# Patient Record
Sex: Female | Born: 1935 | Race: White | Hispanic: No | Smoking: Never smoker
Health system: Southern US, Community
[De-identification: ages and names within clinical notes are randomized; demographics above are authoritative.]

## PROBLEM LIST (undated history)

## (undated) DIAGNOSIS — K5792 Diverticulitis of intestine, part unspecified, without perforation or abscess without bleeding: Secondary | ICD-10-CM

## (undated) DIAGNOSIS — E785 Hyperlipidemia, unspecified: Secondary | ICD-10-CM

## (undated) DIAGNOSIS — Z950 Presence of cardiac pacemaker: Secondary | ICD-10-CM

## (undated) DIAGNOSIS — M1990 Unspecified osteoarthritis, unspecified site: Secondary | ICD-10-CM

## (undated) DIAGNOSIS — R002 Palpitations: Secondary | ICD-10-CM

## (undated) DIAGNOSIS — I10 Essential (primary) hypertension: Secondary | ICD-10-CM

## (undated) DIAGNOSIS — I480 Paroxysmal atrial fibrillation: Secondary | ICD-10-CM

## (undated) DIAGNOSIS — K922 Gastrointestinal hemorrhage, unspecified: Secondary | ICD-10-CM

## (undated) DIAGNOSIS — I2510 Atherosclerotic heart disease of native coronary artery without angina pectoris: Secondary | ICD-10-CM

## (undated) DIAGNOSIS — E871 Hypo-osmolality and hyponatremia: Secondary | ICD-10-CM

## (undated) DIAGNOSIS — Z8639 Personal history of other endocrine, nutritional and metabolic disease: Secondary | ICD-10-CM

## (undated) DIAGNOSIS — Z8719 Personal history of other diseases of the digestive system: Secondary | ICD-10-CM

## (undated) DIAGNOSIS — H9192 Unspecified hearing loss, left ear: Secondary | ICD-10-CM

## (undated) HISTORY — DX: Presence of cardiac pacemaker: Z95.0

## (undated) HISTORY — DX: Personal history of other endocrine, nutritional and metabolic disease: Z86.39

## (undated) HISTORY — DX: Personal history of other diseases of the digestive system: Z87.19

## (undated) HISTORY — PX: OTHER SURGICAL HISTORY: SHX169

## (undated) HISTORY — PX: ABDOMINAL HYSTERECTOMY: SHX81

## (undated) HISTORY — DX: Essential (primary) hypertension: I10

## (undated) HISTORY — DX: Atherosclerotic heart disease of native coronary artery without angina pectoris: I25.10

## (undated) HISTORY — DX: Unspecified hearing loss, left ear: H91.92

## (undated) HISTORY — DX: Palpitations: R00.2

## (undated) HISTORY — PX: MAZE: SHX5063

## (undated) HISTORY — DX: Paroxysmal atrial fibrillation: I48.0

## (undated) HISTORY — PX: PARTIAL HYSTERECTOMY: SHX80

## (undated) HISTORY — DX: Hypo-osmolality and hyponatremia: E87.1

## (undated) HISTORY — DX: Hyperlipidemia, unspecified: E78.5

---

## 2005-08-15 DIAGNOSIS — I2510 Atherosclerotic heart disease of native coronary artery without angina pectoris: Secondary | ICD-10-CM

## 2005-08-15 HISTORY — DX: Atherosclerotic heart disease of native coronary artery without angina pectoris: I25.10

## 2005-09-15 HISTORY — PX: CORONARY ARTERY BYPASS GRAFT: SHX141

## 2005-09-16 ENCOUNTER — Inpatient Hospital Stay (HOSPITAL_BASED_OUTPATIENT_CLINIC_OR_DEPARTMENT_OTHER): Admission: RE | Admit: 2005-09-16 | Discharge: 2005-09-16 | Payer: Self-pay | Admitting: Cardiovascular Disease

## 2005-09-28 ENCOUNTER — Inpatient Hospital Stay (HOSPITAL_COMMUNITY): Admission: RE | Admit: 2005-09-28 | Discharge: 2005-10-04 | Payer: Self-pay | Admitting: Surgery

## 2009-07-13 ENCOUNTER — Emergency Department (HOSPITAL_COMMUNITY): Admission: EM | Admit: 2009-07-13 | Discharge: 2009-07-13 | Payer: Self-pay | Admitting: Emergency Medicine

## 2010-05-11 ENCOUNTER — Ambulatory Visit: Payer: Self-pay | Admitting: Cardiovascular Disease

## 2010-12-31 NOTE — Cardiovascular Report (Signed)
NAME:  [name], [name] NO.:  [id number]   MEDICAL RECORD NO.:  [id number]          PATIENT TYPE:  OIB   LOCATION:  1963                         FACILITY:  [facility]   PHYSICIAN:  [name], M.D. DATE OF BIRTH:  11/17/1925   DATE OF PROCEDURE:  09/16/2005  DATE OF DISCHARGE:                              CARDIAC CATHETERIZATION   Donna Ortega is a 77 year old female who was recently diagnosed as having  atrial fibrillation with a rapid ventricular response.  She was seen in the  office on Monday and she was found to have T-wave versions in the anterior  leads.  She was scheduled for heart catheterization at that time.   The right femoral artery was easily cannulated using the modified Seldinger  technique.   HEMODYNAMICS:  The LV pressure was 123/18 with an aortic pressure of 123/78.   ANGIOGRAPHY:  Left main:  The left main is moderately to heavily calcified.  There is no critical stenoses.   The left anterior descending artery has moderate to severe calcification in  the proximal segment.  There is a 20-30% stenosis followed by a tight  eccentric calcified 80% stenosis.  This stenosis is right at the takeoff of  the first diagonal branch.  The distal LAD has mild irregularities and it is  moderate in size.   The first diagonal vessel arises on the middle of the calcified LAD  stenosis.  The first diagonal has a 70-80% stenosis at its origin.   The left circumflex artery has mild irregularities.  There is no critical  stenosis.   The right coronary artery is very large and is dominant.  There is minor  luminal irregularities.  The posterior descending artery and the  posterolateral segment artery normal.   The left ventriculogram was performed in a 30 RAO position.  It revealed a  hyperdynamic apex.  There is what appears to be very mild hypokinesis of the  anterior wall.  The overall ejection fraction is normal and is probably 60%.  There is mild to  moderate mitral regurgitation.   COMPLICATIONS:  None.   CONCLUSION:  Single-vessel disease involving the left anterior descending  and the first diagonal vessel.  This lesion is very heavily calcified and it  does not look favorable for angioplasty.  We may be able to try rotational  atherectomy followed by stent placement but the diagonal vessel could be  compromised using this technique.  We will also need to consider coronary  artery bypass grafting.  She has overall well-preserved left ventricular  systolic function.  She does have mild hypokinesis of the anterior wall  suggestive of a small subendocardial infarction at  some point in the past.  This is probably the explanation of her  anterolateral T-wave inversions.  We will continue with medical therapy and  will discuss these results with the family.  She may need to be referred for  coronary artery bypass grafting.           ______________________________  [name], M.D.     PJN/MEDQ  D:  09/16/2005  T:  09/16/2005  Job:  [id number]

## 2010-12-31 NOTE — Discharge Summary (Signed)
NAME:  Donna Ortega, Donna Ortega NO.:  [id number]   MEDICAL RECORD NO.:  [id number]          PATIENT TYPE:  INP   LOCATION:  2014                         FACILITY:  [facility]   PHYSICIAN:  Evelene Croon, M.D.     DATE OF BIRTH:  November 29, 1925   DATE OF ADMISSION:  09/28/2005  DATE OF DISCHARGE:  10/04/2005                                 DISCHARGE SUMMARY   ADMISSION DIAGNOSIS:  High-grade single-vessel coronary artery disease.   DISCHARGE DIAGNOSES:  1.  Coronary artery disease, status post coronary artery bypass graft.  2.  Hypercholesterolemia.  3.  Acute blood loss anemia.  4.  Volume overload.  5.  Atrial fibrillation.   SERVICE:  [facility].   CARDIOLOGIST:  Dr. Kristeen Miss.   CONSULTS:  None.   PROCEDURES:  On September 28, 2005, the patient underwent a median  sternotomy, extracorporeal circulation, coronary artery bypass graft surgery  x3 using a left internal mammary artery graft to the second diagonal branch  of the left anterior descending,  saphenous vein graft to the first diagonal  branch of the left anterior descending, a saphenous vein graft to the  intermediate coronary artery.  The patient had a modified radiofrequency Cox-  maze III procedure by Dr. Evelene Croon.  The patient also underwent  endoscopic vein harvesting from the right leg.   HISTORY AND PHYSICAL:  Donna Ortega is a 77 year old healthy woman who remains  active playing golf.  She recently developed shortness of breath and lower  extremity edema as well as decreased energy level.  The patient was treated  in [place], [place], where she was found to have atrial fibrillation with  rapid ventricular response.  She was hospitalized and treated with a  Cardizem drip, which slowed her heart rate.  The patient was discharged on  oral Lopressor as an outpatient and was found to be in atrial fibrillation  with rapid ventricular rate in the 130-140 range, which lasted for a couple  of weeks.  The patient  was referred to Dr. Harvie Bridge office for an  electrocardiogram, which showed T wave inversions in the anterior leads.  The patient was in atrial fibrillation with a controlled ventricular rate in  the 90s.  The patient reportedly had an echocardiogram done in [place],  which showed an ejection fraction of about 40%.   On September 16, 2005, the patient underwent cardiac catheterization, which  showed a left main to be heavily calcified with no particular stenosis.  The  LAD had moderate-to-severe calcification proximally with 20% to 30% stenosis  followed by 70% to 80% stenosis.  The stenosis was at the takeoff of a  moderate-sized first diagonal branch.  The first diagonal itself had 70% to  80% ostial stenosis.  The left circumflex was also calcified proximally.  It  was difficult to see the ostium of the left circumflex, but there was  narrowing in this area.  There was a moderate-sized intermediate or first  marginal branch and a smaller second marginal branch.  The right coronary  artery was a large dominant vessel with moderate  luminal irregularities.  Left ventricular ejection fraction was about 60% with very mild hypokinesis  of the anterior wall.  There was mild mitral regurgitation.  It was best  felt that the patient underwent coronary artery bypass surgery.  Due to the  calcified nature of her arteries, she is not a candidate for a percutaneous  intervention.  The patient has agreed to continue.  It was thought that it  would be best to perform a radiofrequency maze procedure due to the  patient's new onset of atrial fibrillation and she has also agreed to  continue this.   HOSPITAL COURSE:  On postop day #1, the patient remained afebrile, blood  pressure 120/55.  The patient was in sinus versus junctional rhythm.  Her  weight was 148.9; preoperatively, it was 136.4.  The patient's labs were  within normal limits and her chest x-ray was okay.  The patient was  transfused 1 unit  of packed red blood cells on September 29, 2005; her  hemoglobin was 8.1.  On postop day #2, the patient's hemoglobin had risen to  9.1.  She was started on Niferex and folic acid daily.  The patient's  hemoglobin remained stable throughout her stay and improved appropriately.   The patient did have some volume overload and she was diuresed with Lasix  and started on potassium chloride daily.  The patient was restarted on her  Coumadin on postoperative day #2, best thought to resume her Coumadin and  she would stay on this for at least 3 months to be sure she remains in sinus  rhythm.   The patient was transferred to [facility] on September 29, 2005.  The patient  ambulated daily and had improved throughout her hospital stay appropriately.  On October 01, 2005, the patient had an EKG which showed junctional rhythm.  On October 02, 2005, the patient had a 12-lead EKG which showed small P  waves with primary A-V block.  P waves were not seen well on telemetry  monitor.  On October 03, 2005, the patient did spike a temperature of  100.5.  The patient had agreed to stay overnight to be monitored.  She did  remain afebrile overnight and was discharged to home the next day.   PHYSICAL EXAMINATION:  VITALS:  Blood pressure 133/83, heart rate 86,  respirations 20, temperature 98.3, O2 96% on room air.  I&O __________  -  600.  Weight 140, preop 151.  CARDIOVASCULAR:  Regular rate and rhythm.  RESPIRATORY:  Clear to auscultation bilaterally.  EXTREMITIES:  Pitting edema 1+, pulses 2+ bilaterally.  SKIN:  Incisions appear dry and intact, healing well.   LABORATORY DATA:  INR 2.3.  The patient had a CBC which showed a white blood  cell count of 8, hemoglobin 9.6, hematocrit 27.8 and a platelet count of  174,000.  The patient had a BMP which showed a sodium of 128, potassium 3.8,  chloride 92, CO2 27, BUN 12 and creatinine 0.9 and glucose of 95.  On October 04, 2005, the patient's chest tube sutures  were removed and  Steri-Strips were placed.  The patient did have her electronic pacing wires  removed.   DISCHARGE CONDITION:  Good.   DISPOSITION:  The patient will be discharged to home.   MEDICATIONS:  1.  Aspirin 81 mg p.o. daily.  2.  __________  10 mg two daily.  3.  Tylox tabs q.4 h. p.r.n.  4.  Niferex 150 mg daily.  5.  Folic  acid 1 mg p.o. daily.  6.  Lasix 40 mg p.o. daily.  7.  K-Dur 20 mEq p.o. daily.  8.  Coumadin 5 mg p.o. daily.   INSTRUCTIONS:   DIET:  The patient was instructed to follow a low-fat, low-salt diet.   ACTIVITY:  She is to do no driving or heavy lifting greater than 10 pounds  for 3 weeks.   WOUND CARE:  The patient may shower and clean wound with mild soap and  water.  She is to call the office if any wound problems should arise such as  erythema, drainage, opening from wound site.   FOLLOWUP:  The patient has a followup planned with Dr. Laneta Simmers on September 20, 2005 at 12 p.m.  She is to call Dr. Elease Hashimoto for an appointment in 2 weeks  past discharge.  The patient has a followup appointment for an INR on  October 05, 2005 at Dr. Harvie Bridge office.      Constance Holster, [place]      Evelene Croon, M.D.  Electronically Signed    JMW/MEDQ  D:  11/14/2005  T:  11/16/2005  Job:  [id number]   cc:   [name], M.D.  Fax: [phone]

## 2010-12-31 NOTE — Op Note (Signed)
NAME:  Donna Ortega, Donna Ortega NO.:  [id number]   MEDICAL RECORD NO.:  [id number]          PATIENT TYPE:  INP   LOCATION:  2314                         FACILITY:  [facility]   PHYSICIAN:  Evelene Croon, M.D.     DATE OF BIRTH:  June 18, 1926   DATE OF PROCEDURE:  09/28/2005  DATE OF DISCHARGE:                                 OPERATIVE REPORT   PREOPERATIVE DIAGNOSES:  1.  Severe two-vessel coronary artery disease.  2.  Persistent atrial fibrillation.   POSTOPERATIVE DIAGNOSES:  1.  Severe two-vessel coronary artery disease.  2.  Persistent atrial fibrillation.   OPERATIVE PROCEDURE:  1.  Median sternotomy, extracorporeal circulation, coronary artery bypass      graft surgery x3 using a left internal mammary artery graft to the      second diagonal branch of the left anterior descending, a saphenous vein      graft to the first diagonal branch of the left anterior descending, and      a saphenous vein graft to the intermediate coronary artery.  2.  Modified radiofrequency Cox-maze III procedure.  3.  Endoscopic vein harvesting from the right leg.   ATTENDING SURGEON:  Evelene Croon, M.D.   ASSISTANT:  Kerin Perna, M.D.   SECOND ASSISTANTS:  1.  Charlesetta Garibaldi, P.A.-C  2.  Pecola Leisure, P.A.-C   ANESTHESIA:  General endotracheal.   CLINICAL HISTORY:  This patient is a 77 year old relatively healthy woman  who recently developed shortness of breath and lower extremity edema with a  decreased energy level.  She was treated in [place], [place], where she  was found to be in atrial fibrillation with a rapid ventricular response.  She was initially hospitalized and treated with Cardizem for rate control  and was discharged on oral Lopressor.  As an outpatient she was found to the  back in atrial fibrillation with rapid ventricular rate in the 130-140  range, which lasted for several weeks.  Her daughter-in-law is a Engineer, civil (consulting) and  was concerned about her getting  appropriate care and referred her to Dr.  Kristeen Miss at [facility].  An electrocardiogram showed T wave  inversion in the anterior leads.  She was in atrial fibrillation with a  controlled ventricular rate in the 90s.  An echocardiogram had been done in  [place] and this showed an EF of about 40%.  She underwent cardiac  catheterization on September 16, 2005.  This showed the left main to be  heavily calcified with no critical stenosis.  The LAD had moderate-to-severe  calcification proximally with 20% to 30% stenosis followed by the an  eccentric 80% stenosis.  This stenosis was at the takeoff of a moderate-  sized first diagonal branch.  The first diagonal itself had about 70% to 80%  stenosis.  The left circumflex was also calcified proximally and there is  some haziness at the ostium and it was difficult to tell if there was a  stenosis there or not.  There was a moderate-sized intermediate branch and a  small marginal branch.  The right coronary  was a large dominant vessel with  mild irregularities.  Left ventricular ejection fraction was 60% with very  mild hypokinesis of the anterior wall.  There was no mitral regurgitation.  After review of the angiogram and her history, it was felt that coronary  bypass graft surgery and the radiofrequency maze procedure would be the best  treatment to treat her coronary stenoses as well as to try to prevent  further atrial fibrillation, which has been poorly tolerated.  I discussed  the operative procedure of coronary bypass surgery and radiofrequency maze  procedure with the patient and her son.  We discussed the alternatives,  benefits, and risks including, but not limited to bleeding, blood  transfusion, infection, stroke, myocardial infarction, need for permanent  pacemaker, graft failure, persistence of the atrial fibrillation, and death.  She understood and agreed to proceed.   OPERATIVE PROCEDURE:  The patient was taken to the  operating room and placed  on the table in a supine position.  After induction of general endotracheal  anesthesia, a Foley catheter was placed in the bladder using sterile  technique.  Then the chest, abdomen and both lower extremities were prepped  and draped in the usual sterile manner.  The chest was opened through a  median sternotomy incision and the pericardium opened in the midline.  Examination of the heart showed good ventricular contractility, but the  heart was somewhat larger than expected and covered with a thick layer of  epicardial fat over its entire surface.  The ascending aorta was also  generous, but not aneurysmal.  There were no palpable plaques in it.   Then the left internal mammary artery was harvested from the chest wall as a  pedicle graft.  This was a medium-caliber vessel with excellent blood flow  through it.  At the same time, a segment of greater saphenous vein was  harvested from the right leg using endoscopic vein harvest technique.  This  vein was of medium size and good quality.   Then the patient was heparinized and when an adequate activated clotting  time was achieved, the distal ascending aorta was cannulated using a 20-  [place] aortic cannula for arterial inflow.  Venous outflow was achieved  using bicaval venous cannulation with a 24-French metal-tipped right-angle  cannula placed through a pursestring suture in the superior vena cava and a  36-French plastic right-angle cannula placed through a pursestring suture in  the low right atrium.  The superior and inferior vena cavae were encircled  with tapes.  An antegrade cardioplegia and vent cannula was inserted in the  aortic root.   The patient was placed on cardiopulmonary bypass and the distal coronaries  identified.  The LAD was not visible on the surface of the heart.  The first  diagonal branch was a small-to-moderate-sized vessel that was graftable. The second diagonal branch was a  moderate-sized vessel that was graftable.  The intermediate was small, but graftable.  The obtuse marginal was not  graftable.  The right coronary was a large system with no significant plaque  palpable distally.   Then the aorta was crossclamped and 500 mL of cold blood antegrade  cardioplegia were administered in the aortic root with quick arrest of the  heart.  Systemic hypothermia to 20 degrees centigrade and topical  hypothermia with iced saline was used.  A temperature probe was placed in  septum and an insulating pad in the pericardium.  Additional doses of cold  cardioplegia were given  at about 20-minute intervals to maintain myocardial  temperature around 10 degrees centigrade.   Then the modified radiofrequency Cox-maze III procedure was begun by  encircling the left superior and inferior pulmonary vein together with a  Vesseloop.  Then the Cardioblate bipolar radiofrequency device was placed  around the pulmonary veins and up onto the left atrium.  An encircling  lesion was made around the left pulmonary veins in this fashion.  Then this  bipolar device was used to make an encircling lesion around the base of the  left atrial appendage.   Then attention was turned to coronary bypass.  The first distal anastomosis  was performed to the intermediate vessel.  This vessel had an internal  diameter about 1.5 mm.  The conduit that was used was a segment of greater  saphenous vein and the anastomosis performed in an end-to-side manner using  continuous 8-0 Prolene suture.  Flow was noted through the graft and was  good.   The second distal anastomosis was performed to the first diagonal branch.  The internal diameter was about 1.6 mm.  The conduit that was used was a  second segment of greater saphenous vein and the anastomosis performed in an  end-to-side manner using continuous 7-0 Prolene suture.  Flow was measured  through the graft and was excellent.   The third distal  anastomosis was performed to the second diagonal branch.  I  tried to find the LAD deep beneath the epicardial fat, but it was  intramyocardial and lying quite deeply with a large anterior descending vein  lying immediately over this area.  I felt it would be safer to graft the  second diagonal branch, which was roughly about the same size and was  located on the surface of the heart and freely communicated with the LAD  beyond the 80% stenosis.  The conduit used for this anastomosis was the left  internal mammary graft and this was brought through an opening in the left  pericardium anterior to the phrenic nerve.  It was anastomosed to the second  diagonal branch in an end-to-side manner using continuous 8-0 Prolene  suture.  The pedicle was sutured to the epicardium with 6-0 Prolene sutures.   Then attention was turned back to the maze procedure.  The left atrium was  opened through a vertical incision in the interatrial groove.  The encircling lesion around the right pulmonary veins was completed using the  bipolar device.  Then the unipolar device was used to create a lesion  between the 2 encircling pulmonary vein lesions superiorly.  Then a unipolar  lesion was continued from the lesion around the left pulmonary veins up to  the lesion at the base of the left atrial appendage.  The left atrial  appendage was then oversewn with a continuous 3-0 Prolene suture in 2  layers.  Then another lesion was formed with the unipolar device from the  left pulmonary vein encircling lesion down to the mitral annulus in the area  of P3.  Another unipolar lesion was formed from the midpoint of this lesion  across the coronary sinus.   Then attention was turned to the right-sided maze lesions.  The bipolar  device was then used to create an encircling lesion at the base of the right  atrial appendage.  A small atriotomy was made in this area and the bipolar  device was inserted and an oblique lesion  formed along the lateral right  atrial wall to  the midpoint of the right atrium.  Then another oblique  lesion was made in the right atrium from the septum at the region of the  inferior pulmonary vein up to the atrioventricular groove.  The bipolar  device was then inserted and superior and inferior lesions were made from  the lower edge of this atriotomy up to the opening of the superior and  inferior vena cavae, respectively.  Then the bipolar device was used to  create a lesion across the interatrial septum.  This was continued across  the fossa ovalis to the posterior aspect of the coronary sinus.  The  unipolar device was then used to continue this lesion from the posterior  aspect of the coronary sinus up to the tricuspid annulus at the area of the  posterior leaflet.  This lesion was also continued inferiorly from the  coronary sinus down to the orifice of the inferior vena cava.  The unipolar  pen was also used to create lesions from the atrial appendage down to the  tricuspid annulus at the region of the anterior tricuspid leaflet.  The last  lesion was formed from the atriotomy at the region of the atrioventricular  groove down to the area of the posterior leaflet at the tricuspid annulus.  The maze procedure was then completed.  The patient was rewarmed to 37  degrees centigrade.  The left atriotomy incision was closed in 2 layers  using continuous 3-0 Prolene suture.  The right atrial incisions were closed  using continuous 4-0 Prolene suture in 2 layers.  Then the proximal  anastomoses of the vein grafts were performed to the aortic root in an end-  to-side manner using continuous 6-0 Prolene suture.  The left side of the  heart was de-aired and the head placed in a Trendelenburg position.  The  clamp was removed from the mammary pedicle.  There was rapid warming of the  ventricular septum.  Then the crossclamp was removed with a time of 140 minutes.  The patient returned with  a junctional rhythm.  The proximal and  distal anastomoses appeared hemostatic and the lines of the graft  satisfactory.  Graft markers were placed around the proximal anastomoses.  Two temporary right ventricular and atrial pacing wires were placed and  brought out through the skin.   When the patient had rewarmed to 37 degrees centigrade, she was weaned from  cardiopulmonary bypass on low-dose dopamine.  She was then in junctional  rhythm and being AV-paced.  Cardiac function appeared good with a cardiac  output of 4 L/min.  Protamine was given and the venous and aortic cannulae  were removed without difficulty.  Hemostasis was achieved.  The patient was  given 10 units of platelets due to coagulopathy with a platelet count of  around 100,000.  Hemostasis was achieved.  Three chest tubes were placed  with 2 in the posterior pericardium, 1 in the left pleural space and 1 in  the anterior mediastinum.  The pericardium was loosely reapproximated over  the heart.  The sternum was then closed with #6 stainless steel wires.  Fascia was closed with a continuous #1 Vicryl suture.  Subcutaneous tissue  was closed with a continuous 2-0 Vicryl and the skin with 3-0 Vicryl  subcuticular closure.  The lower extremity vein harvest sites were closed in  layers in a similar manner.  The sponge, needle and instrument counts were  correct according to the scrub nurse.  Dry sterile dressings were applied  over the incisions and around the chest tubes, which were hooked to Pleur-  evac suction.  The patient remained hemodynamically stable and was  transported to the SICU in guarded, but stable condition.      Evelene Croon, M.D.  Electronically Signed     BB/MEDQ  D:  09/28/2005  T:  09/29/2005  Job:  [id number]   cc:   [name], M.D.  Fax: [phone]   [facility]

## 2011-01-03 ENCOUNTER — Other Ambulatory Visit: Payer: Self-pay | Admitting: *Deleted

## 2011-01-03 MED ORDER — METOPROLOL TARTRATE 25 MG PO TABS: 25.0000 mg | ORAL_TABLET | Freq: Two times a day (BID) | ORAL | Status: DC

## 2011-01-03 NOTE — Telephone Encounter (Signed)
Metoprolol refilled.

## 2011-01-06 ENCOUNTER — Encounter: Payer: Self-pay | Admitting: Cardiovascular Disease

## 2011-01-11 ENCOUNTER — Ambulatory Visit (INDEPENDENT_AMBULATORY_CARE_PROVIDER_SITE_OTHER): Payer: No Typology Code available for payment source | Admitting: Cardiovascular Disease

## 2011-01-11 ENCOUNTER — Encounter: Payer: Self-pay | Admitting: Cardiovascular Disease

## 2011-01-11 VITALS — BP 132/76 | HR 64 | Ht 64.0 in | Wt 143.0 lb

## 2011-01-11 DIAGNOSIS — I4891 Unspecified atrial fibrillation: Secondary | ICD-10-CM

## 2011-01-11 DIAGNOSIS — I2510 Atherosclerotic heart disease of native coronary artery without angina pectoris: Secondary | ICD-10-CM

## 2011-01-11 DIAGNOSIS — I482 Chronic atrial fibrillation, unspecified: Secondary | ICD-10-CM | POA: Insufficient documentation

## 2011-01-11 DIAGNOSIS — I10 Essential (primary) hypertension: Secondary | ICD-10-CM | POA: Insufficient documentation

## 2011-01-11 NOTE — Assessment & Plan Note (Signed)
She remains stable.  She is tolerating the Pradaxa quite well.

## 2011-01-11 NOTE — Assessment & Plan Note (Signed)
Donna Ortega is doing well from a cardiac standpoint.  She has not had any episodes of angina.

## 2011-01-11 NOTE — Progress Notes (Signed)
[name] Date of Birth  [birth year] [facility] / [facility] [address]     [address] [place], [place]  [address] [phone]  Fax  [phone]  History of Present Illness:  Michelle is an  female with a history of CAD/ CABG.   She also has a history of  atrial fibrillation. No cardiac complaints.  Had a few days of GI bleeding  - thinks it was diverticulitis.    Current Outpatient Prescriptions on File Prior to Visit  Medication Sig Dispense Refill  . aspirin 81 MG tablet Take 81 mg by mouth daily.        . Calcium Carbonate-Vitamin D (CALTRATE 600+D PO) Take by mouth daily.        . dabigatran (PRADAXA) 150 MG CAPS Take 150 mg by mouth every 12 (twelve) hours.        . hydrochlorothiazide (,MICROZIDE/HYDRODIURIL,) 12.5 MG capsule Take 12.5 mg by mouth daily.        Marland Kitchen lisinopril (PRINIVIL,ZESTRIL) 20 MG tablet Take 20 mg by mouth daily. 2 daily      . metoprolol tartrate (LOPRESSOR) 25 MG tablet Take 1 tablet (25 mg total) by mouth 2 (two) times daily.  60 tablet  11  . Multiple Vitamin (MULTIVITAMIN) tablet Take 1 tablet by mouth daily.        . pravastatin (PRAVACHOL) 40 MG tablet Take 40 mg by mouth daily.        Marland Kitchen DISCONTD: Naproxen Sodium (ALEVE PO) Take by mouth as needed.          Allergies  Allergen Reactions  . Lipitor (Atorvastatin Calcium)     MUSCLE ACHES  . Niaspan (Niacin (Antihyperlipidemic)) Rash    Past Medical History  Diagnosis Date  . Coronary artery disease   . Hyperlipidemia   . Hypertension   . Atrial fibrillation   . History of hyperkalemia     Past Surgical History  Procedure Date  . Maze   . Coronary artery bypass graft   . Partial hysterectomy     History  Smoking status  . Never Smoker   Smokeless tobacco  . Not on file    History  Alcohol Use No    Family History  Problem Relation Age of Onset  . Heart failure Father   . Kidney failure Father   . Bone cancer Brother   . Kidney failure Sister     . Colon cancer Sister   . Cancer Sister     Reviw of Systems:  Reviewed in the HPI.  All other systems are negative.  Physical Exam: BP 132/76  Pulse 64  Ht 5\' 4"  (1.626 m)  Wt 143 lb (64.864 kg)  BMI 24.55 kg/m2 The patient is alert and oriented x 3.  The mood and affect are normal.  The skin is warm and dry.  Color is normal.  The HEENT exam reveals that the sclera are nonicteric.  The mucous membranes are moist.  The carotids are 2+ without bruits.  There is no thyromegaly.  There is no JVD.  The lungs are clear.  The chest wall is non tender.  The heart exam reveals an irregular rate with a normal S1 and S2.  There are no murmurs, gallops, or rubs.  The PMI is not displaced.   Abdominal exam reveals good bowel sounds.  There is no guarding or rebound.  There is no hepatosplenomegaly or tenderness.  There are no masses.  Exam of  the legs reveal no clubbing, cyanosis, or edema.  The legs are without rashes.  The distal pulses are intact.  Cranial nerves II - XII are intact.  Motor and sensory functions are intact.  The gait is normal.  Assessment / Plan:

## 2011-01-11 NOTE — Assessment & Plan Note (Signed)
Dr. Everlene Other recently increased her Lisinporil.  Her BP today is perfect.

## 2011-01-14 ENCOUNTER — Telehealth: Payer: Self-pay | Admitting: Cardiovascular Disease

## 2011-01-14 ENCOUNTER — Other Ambulatory Visit: Payer: Self-pay | Admitting: *Deleted

## 2011-01-14 MED ORDER — LISINOPRIL 40 MG PO TABS: 40.0000 mg | ORAL_TABLET | Freq: Every day | ORAL | Status: DC

## 2011-01-14 NOTE — Telephone Encounter (Signed)
Pt said that she got printout of her meds when she was her the other day and some of the meds are not right please call

## 2011-01-14 NOTE — Progress Notes (Signed)
Incoming call to change meds done.Alfonso Ramus RN

## 2011-01-19 ENCOUNTER — Telehealth: Payer: Self-pay | Admitting: Cardiovascular Disease

## 2011-01-19 NOTE — Telephone Encounter (Signed)
25MG  FLUID PILL EVERY MORNING. PT SAID SHE GAVE THE INCORRECT INFO AT HER VISIT THE OTHER DAY.

## 2011-06-15 ENCOUNTER — Telehealth: Payer: Self-pay | Admitting: Cardiovascular Disease

## 2011-07-12 ENCOUNTER — Telehealth: Payer: Self-pay

## 2011-07-12 NOTE — Telephone Encounter (Signed)
Patient walked in .Stated she was to pick up samples of Pradaxa samples.Patient was given 4 week supply.

## 2011-07-15 ENCOUNTER — Encounter: Payer: Self-pay | Admitting: Cardiovascular Disease

## 2011-07-15 ENCOUNTER — Ambulatory Visit (INDEPENDENT_AMBULATORY_CARE_PROVIDER_SITE_OTHER): Payer: No Typology Code available for payment source | Admitting: Cardiovascular Disease

## 2011-07-15 DIAGNOSIS — I2510 Atherosclerotic heart disease of native coronary artery without angina pectoris: Secondary | ICD-10-CM

## 2011-07-15 DIAGNOSIS — I4891 Unspecified atrial fibrillation: Secondary | ICD-10-CM

## 2011-07-15 LAB — LIPID PANEL
LDL Cholesterol: 74 mg/dL (ref 0–99)
Total CHOL/HDL Ratio: 2
Triglycerides: 32 mg/dL (ref 0.0–149.0)
VLDL: 6.4 mg/dL (ref 0.0–40.0)

## 2011-07-15 LAB — BASIC METABOLIC PANEL
Chloride: 90 mEq/L — ABNORMAL LOW (ref 96–112)
Creatinine, Ser: 0.8 mg/dL (ref 0.4–1.2)
GFR: 78.39 mL/min (ref >60.00)
Sodium: 126 mEq/L — ABNORMAL LOW (ref 135–145)

## 2011-07-15 LAB — HEPATIC FUNCTION PANEL
Albumin: 4 g/dL (ref 3.5–5.2)
Total Bilirubin: 0.6 mg/dL (ref 0.3–1.2)
Total Protein: 6.9 g/dL (ref 6.0–8.3)

## 2011-07-15 NOTE — Patient Instructions (Addendum)
look up Xarelto 20 mg a day.  You could take this instead of the Pradaxa if it is cheaper.  Your physician recommends that you return for a FASTING lipid profile: today and in 6 months  Your physician wants you to follow-up in: 6 months You will receive a reminder letter in the mail two months in advance. If you don't receive a letter, please call our office to schedule the follow-up appointment.

## 2011-07-15 NOTE — Assessment & Plan Note (Signed)
She remains in normal sinus rhythm. She has documented intermittent atrial fibrillation. She is concerned about the price of Pradaza. I've given her the name of Donna Ortega which she will look up on her insurance plan. She does not want to start Coumadin.

## 2011-07-15 NOTE — Assessment & Plan Note (Signed)
Donna Ortega is very stable. We'll continue with her same medications.

## 2011-07-15 NOTE — Progress Notes (Signed)
    Donna Ortega Date of Birth  20-Apr-1926 [facility] [address]    [address] [place], [place]  [address] [phone]  Fax  [phone]  History of Present Illness:  Donna Ortega is an 77 year old female with a history of coronary artery disease.  She is  status post coronary artery bypass grafting.  She has a history of atrial fibrillation but is in normal sinus rhythm today. She's not having any episodes of chest pain or shortness breath.  Current Outpatient Prescriptions on File Prior to Visit  Medication Sig Dispense Refill  . acetaminophen (TYLENOL) 325 MG tablet Take 650 mg by mouth every 6 (six) hours as needed.        Marland Kitchen aspirin 81 MG tablet Take 81 mg by mouth daily.        . Calcium Carbonate-Vitamin D (CALTRATE 600+D PO) Take by mouth daily.        . dabigatran (PRADAXA) 150 MG CAPS Take 150 mg by mouth every 12 (twelve) hours.        . hydrochlorothiazide 25 MG tablet Take 12.5 mg by mouth daily.       Marland Kitchen lisinopril (PRINIVIL,ZESTRIL) 40 MG tablet Take 1 tablet (40 mg total) by mouth daily. 2 daily      . metoprolol tartrate (LOPRESSOR) 25 MG tablet Take 1 tablet (25 mg total) by mouth 2 (two) times daily.  60 tablet  11  . Multiple Vitamin (MULTIVITAMIN) tablet Take 1 tablet by mouth daily.        . pravastatin (PRAVACHOL) 40 MG tablet Take 40 mg by mouth daily.          Allergies  Allergen Reactions  . Lipitor (Atorvastatin Calcium)     MUSCLE ACHES  . Niaspan (Niacin (Antihyperlipidemic)) Rash    Past Medical History  Diagnosis Date  . Coronary artery disease   . Hyperlipidemia   . Hypertension   . Atrial fibrillation   . History of hyperkalemia   . Hyponatremia     Past Surgical History  Procedure Date  . Maze   . Coronary artery bypass graft   . Partial hysterectomy     History  Smoking status  . Never Smoker   Smokeless tobacco  . Not on file    History  Alcohol Use No    Family History  Problem Relation Age of Onset  . Heart  failure Father   . Kidney failure Father   . Bone cancer Brother   . Kidney failure Sister   . Colon cancer Sister   . Cancer Sister     Reviw of Systems:  Reviewed in the HPI.  All other systems are negative.  Physical Exam: BP 155/71  Pulse 64  Ht 5\' 4"  (1.626 m)  Wt 144 lb (65.318 kg)  BMI 24.72 kg/m2 The patient is alert and oriented x 3.  The mood and affect are normal.   Skin: warm and dry.  Color is normal.    HEENT:   [place]/AT, no JVD, normal carotids  Lungs: Lungs are clear.  Heart: RR, soft systolic murmur    Abdomen: Her abdomen is soft. She has good bowel sounds.  Extremities:  No clubbing cyanosis or edema.  Neuro:  Exam is nonfocal. Gait is normal.    ECG: Normal sinus rhythm. She has a few premature atrial contractions. She has nonspecific ST and T-wave changes.  Assessment / Plan:

## 2011-07-16 DIAGNOSIS — Z8719 Personal history of other diseases of the digestive system: Secondary | ICD-10-CM

## 2011-07-16 HISTORY — DX: Personal history of other diseases of the digestive system: Z87.19

## 2011-08-01 ENCOUNTER — Other Ambulatory Visit: Payer: Self-pay | Admitting: Cardiovascular Disease

## 2011-08-03 ENCOUNTER — Emergency Department (HOSPITAL_COMMUNITY): Payer: No Typology Code available for payment source

## 2011-08-03 ENCOUNTER — Inpatient Hospital Stay (HOSPITAL_COMMUNITY)
Admission: EM | Admit: 2011-08-03 | Discharge: 2011-08-14 | DRG: 515 | Disposition: A | Discharge status: Home / Self Care | Payer: No Typology Code available for payment source | Attending: Internal Medicine | Admitting: Internal Medicine

## 2011-08-03 ENCOUNTER — Encounter (HOSPITAL_COMMUNITY): Payer: Self-pay | Admitting: *Deleted

## 2011-08-03 ENCOUNTER — Other Ambulatory Visit: Payer: Self-pay

## 2011-08-03 DIAGNOSIS — K922 Gastrointestinal hemorrhage, unspecified: Secondary | ICD-10-CM | POA: Diagnosis present

## 2011-08-03 DIAGNOSIS — M810 Age-related osteoporosis without current pathological fracture: Secondary | ICD-10-CM | POA: Diagnosis present

## 2011-08-03 DIAGNOSIS — E871 Hypo-osmolality and hyponatremia: Secondary | ICD-10-CM | POA: Diagnosis present

## 2011-08-03 DIAGNOSIS — T465X5A Adverse effect of other antihypertensive drugs, initial encounter: Secondary | ICD-10-CM | POA: Diagnosis present

## 2011-08-03 DIAGNOSIS — K274 Chronic or unspecified peptic ulcer, site unspecified, with hemorrhage: Secondary | ICD-10-CM | POA: Diagnosis present

## 2011-08-03 DIAGNOSIS — Z9889 Other specified postprocedural states: Secondary | ICD-10-CM

## 2011-08-03 DIAGNOSIS — I4891 Unspecified atrial fibrillation: Secondary | ICD-10-CM | POA: Diagnosis present

## 2011-08-03 DIAGNOSIS — M8448XA Pathological fracture, other site, initial encounter for fracture: Principal | ICD-10-CM | POA: Diagnosis present

## 2011-08-03 DIAGNOSIS — D62 Acute posthemorrhagic anemia: Secondary | ICD-10-CM | POA: Diagnosis present

## 2011-08-03 DIAGNOSIS — N183 Chronic kidney disease, stage 3 unspecified: Secondary | ICD-10-CM | POA: Diagnosis present

## 2011-08-03 DIAGNOSIS — M545 Low back pain, unspecified: Secondary | ICD-10-CM | POA: Diagnosis present

## 2011-08-03 DIAGNOSIS — Z7901 Long term (current) use of anticoagulants: Secondary | ICD-10-CM

## 2011-08-03 DIAGNOSIS — I482 Chronic atrial fibrillation, unspecified: Secondary | ICD-10-CM | POA: Diagnosis present

## 2011-08-03 DIAGNOSIS — Z951 Presence of aortocoronary bypass graft: Secondary | ICD-10-CM

## 2011-08-03 DIAGNOSIS — I959 Hypotension, unspecified: Secondary | ICD-10-CM | POA: Diagnosis present

## 2011-08-03 DIAGNOSIS — I129 Hypertensive chronic kidney disease with stage 1 through stage 4 chronic kidney disease, or unspecified chronic kidney disease: Secondary | ICD-10-CM | POA: Diagnosis present

## 2011-08-03 DIAGNOSIS — K921 Melena: Secondary | ICD-10-CM | POA: Diagnosis present

## 2011-08-03 DIAGNOSIS — I10 Essential (primary) hypertension: Secondary | ICD-10-CM

## 2011-08-03 DIAGNOSIS — S32000A Wedge compression fracture of unspecified lumbar vertebra, initial encounter for closed fracture: Secondary | ICD-10-CM | POA: Diagnosis present

## 2011-08-03 DIAGNOSIS — I2510 Atherosclerotic heart disease of native coronary artery without angina pectoris: Secondary | ICD-10-CM | POA: Diagnosis present

## 2011-08-03 DIAGNOSIS — E785 Hyperlipidemia, unspecified: Secondary | ICD-10-CM | POA: Diagnosis present

## 2011-08-03 HISTORY — DX: Diverticulitis of intestine, part unspecified, without perforation or abscess without bleeding: K57.92

## 2011-08-03 LAB — PROTIME-INR
INR: 1.67 — ABNORMAL HIGH (ref 0.00–1.49)
Prothrombin Time: 20 seconds — ABNORMAL HIGH (ref 11.6–15.2)

## 2011-08-03 LAB — CBC
HCT: 24.6 % — ABNORMAL LOW (ref 36.0–46.0)
Hemoglobin: 8.5 g/dL — ABNORMAL LOW (ref 12.0–15.0)
MCH: 31.1 pg (ref 26.0–34.0)
RBC: 2.73 MIL/uL — ABNORMAL LOW (ref 3.87–5.11)

## 2011-08-03 LAB — CARDIAC PANEL(CRET KIN+CKTOT+MB+TROPI)
CK, MB: 3.4 ng/mL (ref 0.3–4.0)
Total CK: 75 U/L (ref 7–177)
Troponin I: <0.3 ng/mL (ref <0.30)

## 2011-08-03 LAB — TROPONIN I: Troponin I: 0.34 ng/mL (ref <0.30)

## 2011-08-03 LAB — POCT I-STAT, CHEM 8
BUN: 35 mg/dL — ABNORMAL HIGH (ref 6–23)
Chloride: 96 mEq/L (ref 96–112)
Sodium: 126 mEq/L — ABNORMAL LOW (ref 135–145)
TCO2: 22 mmol/L (ref 0–100)

## 2011-08-03 LAB — APTT: aPTT: 59 seconds — ABNORMAL HIGH (ref 24–37)

## 2011-08-03 MED ORDER — DILTIAZEM HCL 25 MG/5ML IV SOLN
10.0000 mg | Freq: Once | INTRAVENOUS | Status: AC
  Administered 2011-08-03: 10 mg via INTRAVENOUS
  Filled 2011-08-03: qty 5

## 2011-08-03 MED ORDER — FUROSEMIDE 10 MG/ML IJ SOLN
20.0000 mg | Freq: Once | INTRAMUSCULAR | Status: AC
  Administered 2011-08-04: 20 mg via INTRAVENOUS
  Filled 2011-08-03: qty 2

## 2011-08-03 MED ORDER — SODIUM CHLORIDE 0.9 % IV SOLN
8.0000 mg/h | INTRAVENOUS | Status: DC
  Administered 2011-08-03: 8 mg/h via INTRAVENOUS
  Filled 2011-08-03 (×6): qty 80

## 2011-08-03 MED ORDER — ACETAMINOPHEN 650 MG RE SUPP: 650.0000 mg | Freq: Four times a day (QID) | RECTAL | Status: DC | PRN

## 2011-08-03 MED ORDER — OXYCODONE-ACETAMINOPHEN 5-325 MG PO TABS
1.0000 | ORAL_TABLET | Freq: Four times a day (QID) | ORAL | Status: DC | PRN
  Administered 2011-08-03 – 2011-08-05 (×4): 1 via ORAL
  Filled 2011-08-03 (×4): qty 1

## 2011-08-03 MED ORDER — SODIUM CHLORIDE 0.9 % IV SOLN
INTRAVENOUS | Status: DC
  Administered 2011-08-03: 16:00:00 via INTRAVENOUS

## 2011-08-03 MED ORDER — DILTIAZEM HCL 25 MG/5ML IV SOLN
10.0000 mg | Freq: Once | INTRAVENOUS | Status: AC
  Administered 2011-08-03: 10 mg via INTRAVENOUS

## 2011-08-03 MED ORDER — ADENOSINE 12 MG/4ML IV SOLN
12.0000 mg | Freq: Once | INTRAVENOUS | Status: DC
  Filled 2011-08-03: qty 4

## 2011-08-03 MED ORDER — MORPHINE SULFATE 2 MG/ML IJ SOLN
1.0000 mg | INTRAMUSCULAR | Status: DC | PRN
  Administered 2011-08-03 – 2011-08-06 (×3): 1 mg via INTRAVENOUS
  Filled 2011-08-03 (×2): qty 1

## 2011-08-03 MED ORDER — ONDANSETRON HCL 4 MG PO TABS: 4.0000 mg | ORAL_TABLET | Freq: Four times a day (QID) | ORAL | Status: DC | PRN

## 2011-08-03 MED ORDER — MORPHINE SULFATE 4 MG/ML IJ SOLN
4.0000 mg | Freq: Once | INTRAMUSCULAR | Status: AC
  Administered 2011-08-03: 4 mg via INTRAVENOUS
  Filled 2011-08-03: qty 1

## 2011-08-03 MED ORDER — DILTIAZEM HCL 100 MG IV SOLR
5.0000 mg/h | Freq: Once | INTRAVENOUS | Status: AC
  Administered 2011-08-03: 5 mg/h via INTRAVENOUS
  Filled 2011-08-03: qty 100

## 2011-08-03 MED ORDER — SODIUM CHLORIDE 0.9 % IV SOLN
250.0000 mL | INTRAVENOUS | Status: DC | PRN
  Administered 2011-08-03 – 2011-08-06 (×2): 250 mL via INTRAVENOUS

## 2011-08-03 MED ORDER — ACETAMINOPHEN 325 MG PO TABS: 650.0000 mg | ORAL_TABLET | Freq: Four times a day (QID) | ORAL | Status: DC | PRN

## 2011-08-03 MED ORDER — ONDANSETRON HCL 4 MG/2ML IJ SOLN: 4.0000 mg | Freq: Four times a day (QID) | INTRAMUSCULAR | Status: DC | PRN

## 2011-08-03 MED ORDER — DILTIAZEM HCL 100 MG IV SOLR
2.0000 mg/h | INTRAVENOUS | Status: DC
  Filled 2011-08-03: qty 100

## 2011-08-03 MED ORDER — ONDANSETRON HCL 4 MG/2ML IJ SOLN
4.0000 mg | Freq: Once | INTRAMUSCULAR | Status: AC
  Administered 2011-08-03: 4 mg via INTRAVENOUS
  Filled 2011-08-03: qty 2

## 2011-08-03 MED ORDER — SODIUM CHLORIDE 0.9 % IJ SOLN
3.0000 mL | INTRAMUSCULAR | Status: DC | PRN
  Administered 2011-08-06 (×2): 3 mL via INTRAVENOUS

## 2011-08-03 MED ORDER — SODIUM CHLORIDE 0.9 % IV SOLN
80.0000 mg | Freq: Once | INTRAVENOUS | Status: AC
  Administered 2011-08-03: 80 mg via INTRAVENOUS
  Filled 2011-08-03: qty 80

## 2011-08-03 MED ORDER — SODIUM CHLORIDE 0.9 % IJ SOLN
3.0000 mL | Freq: Two times a day (BID) | INTRAMUSCULAR | Status: DC
  Administered 2011-08-03 – 2011-08-13 (×16): 3 mL via INTRAVENOUS

## 2011-08-03 MED ORDER — ALBUTEROL SULFATE (5 MG/ML) 0.5% IN NEBU: 2.5000 mg | INHALATION_SOLUTION | RESPIRATORY_TRACT | Status: DC | PRN

## 2011-08-03 MED ORDER — SODIUM CHLORIDE 0.9 % IV SOLN
INTRAVENOUS | Status: DC
  Administered 2011-08-03 – 2011-08-05 (×2): via INTRAVENOUS

## 2011-08-03 MED ORDER — METOPROLOL TARTRATE 12.5 MG HALF TABLET
12.5000 mg | ORAL_TABLET | Freq: Two times a day (BID) | ORAL | Status: DC
  Administered 2011-08-03 – 2011-08-04 (×3): 12.5 mg via ORAL
  Filled 2011-08-03 (×5): qty 1

## 2011-08-03 NOTE — H&P (Signed)
Patient ID:  Donna Ortega  MRN: [id number]  DOB/AGE: 75/03/1926 75 y.o.  Admit date: 08/03/2011  Referring Physician Dr Denton Lank  Primary Physician Dr Everlene Other  Primary Cardiologist Dr Elease Hashimoto  Reason for Consultation Atrial Fibrillation with RVR  HPI: 77 year-old woman with history of CAD s/p 2 vessel CABG in 2007 and paroxysmal atrial fib presented to the ER today and was found to be in atrial fib with RVR. She has also had back pain for which she was evaluated by an MRI last week, She came for ER evaluation of low back pain. She has severe low back pain uncontrolled with oral narcotic analgesics. She has known compression fractures and is under the care of Dr Darrelyn Hillock. She recently underwent a lumbar spine MRI and is awaiting results of this to see if she is a candidate for vertebroplasty. She also notes recent blood in stools as well as dark, melenic stools per her report. These symptoms have been present for 5-6 days. She has been anticoagulated with Pradaxa.  On presentation to the ER the patient was noted to have a very rapid heart rate of approximately 180 bpm. She has now slowed to approx 100-110 bpm on a cardizem drip at 5 mg/hr. She has no cardiac symptoms at present and she specifically denies chest pain, chest pressure, dyspnea, edema, orthopnea, palpitations, lightheadedness, or syncope. She was recently seen in the outpatient setting by Dr Elease Hashimoto on 11/30 and was in sinus rhythm at that time.  Past Medical History   Diagnosis  Date   .  Coronary artery disease  2007     3 vessel CABG with LIMA-LAD, SVG to diag, and SVG - Ramus   .  Hyperlipidemia    .  Hypertension    .  Atrial fibrillation      paroxysmal   .  History of hyperkalemia    .  Hyponatremia    .  Diverticulitis     Past Surgical History   Procedure  Date   .  Maze    .  Coronary artery bypass graft    .  Partial hysterectomy     Family History   Problem  Relation  Age of Onset   .  Heart failure  Father    .   Kidney failure  Father    .  Bone cancer  Brother    .  Kidney failure  Sister    .  Colon cancer  Sister    .  Cancer  Sister     Social History:  History    Social History   .  Marital Status:  Single     Spouse Name:  N/A     Number of Children:  N/A   .  Years of Education:  N/A    Occupational History   .  Not on file.    Social History Main Topics   .  Smoking status:  Never Smoker   .  Smokeless tobacco:  Not on file   .  Alcohol Use:  No   .  Drug Use:  No   .  Sexually Active:     Other Topics  Concern   .  Not on file    Social History Narrative   .  No narrative on file    Family History: Negative for premature CAD  ROS:  General: no fevers/chills/night sweats  Eyes: no blurry vision, diplopia, or amaurosis  ENT: no sore throat or hearing  loss  Resp: no cough, wheezing, or hemoptysis  CV: no edema or palpitations - see HPI  GI: mild abdominal tenderness, otherwise per HPI  GU: no dysuria, frequency, or hematuria  Skin: no rash  Neuro: no headache, numbness, tingling, or weakness of extremities  Musculoskeletal: severe back pain  Heme: no bleeding, DVT, or easy bruising  Endo: no polydipsia or polyuria  Physical Exam:  Blood pressure 115/60, pulse 93, temperature 97.8 F (36.6 C), temperature source Oral, resp. rate 22, SpO2 100.00%.  Pt is alert and oriented, very pleasant elderly woman in no distress.  HEENT: normal  Neck: JVP normal. Carotid upstrokes normal without bruits. No thyromegaly.  Lungs: equal expansion, clear bilaterally  CV: Apex is discrete and nondisplaced, irregularly irregular with grade 2/6 ejection murmur at the LSB  Abd: soft, mild diffuse tenderness, +BS, no bruit, no hepatosplenomegaly  Ext: trace edema bilaterally  Femoral pulses 2+= without bruits  DP/PT pulses intact and =  Skin: warm and dry without rash  Neuro: CNII-XII intact  Strength intact = bilaterally  Labs:  Lab Results   Component  Value  Date    WBC  8.5   08/03/2011    HGB  8.5*  08/03/2011    HCT  25.0*  08/03/2011    MCV  90.1  08/03/2011    PLT  274  08/03/2011     Lab  08/03/11 1541   NA  126*   K  4.7   CL  96   CO2  --   BUN  35*   CREATININE  0.80   CALCIUM  --   PROT  --   BILITOT  --   ALKPHOS  --   ALT  --   AST  --   GLUCOSE  104*    No results found for this basename: CKTOTAL, CKMB, CKMBINDEX, TROPONINI    Lab Results   Component  Value  Date    CHOL  177  07/15/2011    Lab Results   Component  Value  Date    HDL  96.40  07/15/2011    Lab Results   Component  Value  Date    LDLCALC  74  07/15/2011    Lab Results   Component  Value  Date    TRIG  32.0  07/15/2011    Lab Results   Component  Value  Date    CHOLHDL  2  07/15/2011    No results found for this basename: LDLDIRECT    Radiology:  No results found.  EKG: Atrial flutter with RVR 186 bpm  ASSESSMENT AND PLAN:  1. Atrial fib with RVR. The patient has known PAF. Her heart rate is better controlled on IV cardizem. Will  continue cardizem and titrate to resting heart rate less than 90 bpm. Will hold pradaxa in setting of GI bleed .Discussed plan with son and patient. Abnormal troponin. Not a candiadate for intervention or antiplatelet therapy. 2. GI bleed. I cannot find baseline Hemoglobin in EPIC or EChart. Today's hemoglobin is 8.5 mg/dL and suspect acute on chronic anemia as she is hemodynamically stable. Recommend hold Pradaxa and consult GI for consideration colonoscopy/EGD. Spoke to Dr Orlene Plum d, NPO past midnight  For EGD in am .Start protonix gtt.Trasfuse PRBC  3. Lumbar Compression Fracture/Severe Back Pain. MRI done last week. Too unstable to undergo any rx for this ,will differ to outpt workup. Goal is pain control.Son agreeable .

## 2011-08-03 NOTE — ED Notes (Signed)
4401-02 READY

## 2011-08-03 NOTE — ED Notes (Signed)
EKG done and given to Dr. Minus Liberty

## 2011-08-03 NOTE — Consult Note (Signed)
CARDIOLOGY CONSULT NOTE  Patient ID: Donna Ortega MRN: [id number] DOB/AGE: [birth year] 77 y.o.  Admit date: 08/03/2011 Referring Physician Dr Denton Lank Primary Physician Dr Everlene Other Primary Cardiologist Dr Elease Hashimoto Reason for Consultation Atrial Fibrillation with RVR  HPI: 77 year-old woman with history of CAD s/p 2 vessel CABG in 2007 and paroxysmal atrial fib presented to the ER today and was found to be in atrial fib with RVR. We were asked to see her in consultation. She came for ER evaluation of low back pain. She has severe low back pain uncontrolled with oral narcotic analgesics. She has known compression fractures and is under the care of Dr Darrelyn Hillock. She recently underwent a lumbar spine MRI and is awaiting results of this to see if she is a candidate for vertebroplasty. She also notes recent blood in stools as well as dark, melenic stools per her report. These symptoms have been present for 5-6 days. She has been anticoagulated with Pradaxa.  On presentation to the ER the patient was noted to have a very rapid heart rate of approximately 180 bpm. She has now slowed to approx 100-110 bpm on a cardizem drip at 5 mg/hr. She has no cardiac symptoms at present and she specifically denies chest pain, chest pressure, dyspnea, edema, orthopnea, palpitations, lightheadedness, or syncope. She was recently seen in the outpatient setting by Dr Elease Hashimoto on 11/30 and was in sinus rhythm at that time.   Past Medical History  Diagnosis Date  . Coronary artery disease 2007    3 vessel CABG with LIMA-LAD, SVG to diag, and SVG - Ramus  . Hyperlipidemia   . Hypertension   . Atrial fibrillation     paroxysmal  . History of hyperkalemia   . Hyponatremia   . Diverticulitis      Past Surgical History  Procedure Date  . Maze   . Coronary artery bypass graft   . Partial hysterectomy      Family History  Problem Relation Age of Onset  . Heart failure Father   . Kidney failure Father   . Bone cancer Brother    . Kidney failure Sister   . Colon cancer Sister   . Cancer Sister     Social History: History   Social History  . Marital Status: Single    Spouse Name: N/A    Number of Children: N/A  . Years of Education: N/A   Occupational History  . Not on file.   Social History Main Topics  . Smoking status: Never Smoker   . Smokeless tobacco: Not on file  . Alcohol Use: No  . Drug Use: No  . Sexually Active:    Other Topics Concern  . Not on file   Social History Narrative  . No narrative on file    Family History: Negative for premature CAD  ROS: General: no fevers/chills/night sweats Eyes: no blurry vision, diplopia, or amaurosis ENT: no sore throat or hearing loss Resp: no cough, wheezing, or hemoptysis CV: no edema or palpitations - see HPI GI: mild abdominal tenderness, otherwise per HPI GU: no dysuria, frequency, or hematuria Skin: no rash Neuro: no headache, numbness, tingling, or weakness of extremities Musculoskeletal: severe back pain Heme: no bleeding, DVT, or easy bruising Endo: no polydipsia or polyuria    Physical Exam: Blood pressure 115/60, pulse 93, temperature 97.8 F (36.6 C), temperature source Oral, resp. rate 22, SpO2 100.00%.  Pt is alert and oriented, very pleasant elderly woman in no distress. HEENT: normal Neck:  JVP normal. Carotid upstrokes normal without bruits. No thyromegaly. Lungs: equal expansion, clear bilaterally CV: Apex is discrete and nondisplaced, irregularly irregular with grade 2/6 ejection murmur at the LSB Abd: soft, mild diffuse tenderness, +BS, no bruit, no hepatosplenomegaly Ext: trace edema bilaterally        Femoral pulses 2+= without bruits        DP/PT pulses intact and = Skin: warm and dry without rash Neuro: CNII-XII intact             Strength intact = bilaterally  Labs:   Lab Results  Component Value Date   WBC 8.5 08/03/2011   HGB 8.5* 08/03/2011   HCT 25.0* 08/03/2011   MCV 90.1 08/03/2011   PLT 274  08/03/2011     Lab 08/03/11 1541  NA 126*  K 4.7  CL 96  CO2 --  BUN 35*  CREATININE 0.80  CALCIUM --  PROT --  BILITOT --  ALKPHOS --  ALT --  AST --  GLUCOSE 104*   No results found for this basename: CKTOTAL,  CKMB,  CKMBINDEX,  TROPONINI    Lab Results  Component Value Date   CHOL 177 07/15/2011   Lab Results  Component Value Date   HDL 96.40 07/15/2011   Lab Results  Component Value Date   LDLCALC 74 07/15/2011   Lab Results  Component Value Date   TRIG 32.0 07/15/2011   Lab Results  Component Value Date   CHOLHDL 2 07/15/2011   No results found for this basename: LDLDIRECT      Radiology: No results found.  EKG: Atrial flutter with RVR 186 bpm  ASSESSMENT AND PLAN:  1. Atrial fib with RVR. The patient has known PAF. Her heart rate is better controlled on IV cardizem. Recommend continue cardizem and titrate to resting heart rate less than 90 bpm. Will hold pradaxa in setting of GI bleed (see below).  2. GI bleed. I cannot find baseline Hemoglobin in EPIC or EChart. Today's hemoglobin is 8.5 mg/dL and suspect acute on chronic anemia as she is hemodynamically stable. Recommend hold Pradaxa and consult GI for consideration colonoscopy/EGD.  3. Lumbar Compression Fracture/Severe Back Pain. MRI done last week. Consider consult with Dr Darrelyn Hillock for further treatment plan ? vertebroplasty.  Tonny Bollman 08/03/2011, 5:04 PM

## 2011-08-03 NOTE — ED Provider Notes (Signed)
History     CSN: [id number] Arrival date & time: 08/03/2011  2:00 PM   First MD Initiated Contact with Patient 08/03/11 1458      Chief Complaint  Patient presents with  . Back Pain  . Abdominal Pain    (Consider location/radiation/quality/duration/timing/severity/associated sxs/prior treatment) Patient is a 77 y.o. female presenting with back pain and abdominal pain. The history is provided by the patient.  Back Pain  Associated symptoms include abdominal pain. Pertinent negatives include no chest pain, no fever and no headaches.  Abdominal Pain The primary symptoms of the illness include abdominal pain. The primary symptoms of the illness do not include fever or shortness of breath.  Additional symptoms associated with the illness include back pain. Symptoms associated with the illness do not include chills.  pt c/o low back pain for past couple weeks. Notes recent dx compression fracture. Low back, dull, constant, non radiating pain, no specific exacerbating or alleviating factors. No radicular pain, no numbness/weakness. No abd pain. Pt on arrival to ED noted to be tachycardic. Denies palpitations. No chest pain or discomfort. No sob or unusual doe. No faintness or dizziness. Compliant w meds. Denies recent change in meds.   Past Medical History  Diagnosis Date  . Coronary artery disease   . Hyperlipidemia   . Hypertension   . Atrial fibrillation   . History of hyperkalemia   . Hyponatremia   . Diverticulitis     Past Surgical History  Procedure Date  . Maze   . Coronary artery bypass graft   . Partial hysterectomy     Family History  Problem Relation Age of Onset  . Heart failure Father   . Kidney failure Father   . Bone cancer Brother   . Kidney failure Sister   . Colon cancer Sister   . Cancer Sister     History  Substance Use Topics  . Smoking status: Never Smoker   . Smokeless tobacco: Not on file  . Alcohol Use: No    OB History    Grav Para Term  Preterm Abortions TAB SAB Ect Mult Living                  Review of Systems  Constitutional: Negative for fever and chills.  HENT: Negative for neck pain.   Eyes: Negative for redness.  Respiratory: Negative for shortness of breath.   Cardiovascular: Negative for chest pain and palpitations.  Gastrointestinal: Positive for abdominal pain.  Genitourinary: Negative for flank pain.  Musculoskeletal: Positive for back pain.  Skin: Negative for rash.  Neurological: Negative for headaches.  Hematological: Does not bruise/bleed easily.  Psychiatric/Behavioral: Negative for confusion.    Allergies  Lipitor and Niaspan  Home Medications   Current Outpatient Rx  Name Route Sig Dispense Refill  . ACETAMINOPHEN 325 MG PO TABS Oral Take 650 mg by mouth every 6 (six) hours as needed.      . ASPIRIN 81 MG PO TABS Oral Take 81 mg by mouth daily.      Marland Kitchen CALTRATE 600+D PO Oral Take by mouth daily.      Marland Kitchen DABIGATRAN ETEXILATE MESYLATE 150 MG PO CAPS Oral Take 150 mg by mouth every 12 (twelve) hours.      Marland Kitchen HYDROCHLOROTHIAZIDE 25 MG PO TABS  TAKE 1/2 TABLET BY MOUTH EVERY DAY 15 tablet 4  . HYDROCODONE-ACETAMINOPHEN 5-325 MG PO TABS Oral Take 1 tablet by mouth every 6 (six) hours as needed. For pain     .  LISINOPRIL 40 MG PO TABS Oral Take 40 mg by mouth daily.      Marland Kitchen METOPROLOL TARTRATE 25 MG PO TABS Oral Take 1 tablet (25 mg total) by mouth 2 (two) times daily. 60 tablet 11  . ONE-DAILY MULTI VITAMINS PO TABS Oral Take 1 tablet by mouth daily.      Marland Kitchen PRAVASTATIN SODIUM 40 MG PO TABS Oral Take 40 mg by mouth daily.      . OXYCODONE-ACETAMINOPHEN 5-325 MG PO TABS Oral Take 1 tablet by mouth every 4 (four) hours as needed. For pain       BP 132/84  Pulse 183  Temp(Src) 97.8 F (36.6 C) (Oral)  Resp 17  SpO2 97%  Physical Exam  Nursing note and vitals reviewed. Constitutional: She is oriented to person, place, and time. She appears well-developed and well-nourished.  Eyes: Conjunctivae  are normal. No scleral icterus.  Neck: Neck supple. No tracheal deviation present. No thyromegaly present.  Cardiovascular: Regular rhythm, normal heart sounds and intact distal pulses.   Pulmonary/Chest: Effort normal and breath sounds normal. No respiratory distress.  Abdominal: Soft. Normal appearance. She exhibits no distension. There is no tenderness.       No pulsatile mass  Musculoskeletal: She exhibits no edema and no tenderness.       Spine without focal tenderness. Lumbar tenderness diffusely.   Neurological: She is alert and oriented to person, place, and time.  Skin: Skin is warm and dry. No rash noted.  Psychiatric: She has a normal mood and affect.  rectal: black heme pos stools  ED Course  Procedures (including critical care time)   Labs Reviewed  CBC  I-STAT, CHEM 8  TROPONIN I   Results for orders placed during the hospital encounter of 08/03/11  CBC      Component Value Range   WBC 8.5  4.0 - 10.5 (K/uL)   RBC 2.73 (*) 3.87 - 5.11 (MIL/uL)   Hemoglobin 8.5 (*) 12.0 - 15.0 (g/dL)   HCT 16.1 (*) 09.6 - 46.0 (%)   MCV 90.1  78.0 - 100.0 (fL)   MCH 31.1  26.0 - 34.0 (pg)   MCHC 34.6  30.0 - 36.0 (g/dL)   RDW 04.5  40.9 - 81.1 (%)   Platelets 274  150 - 400 (K/uL)  POCT I-STAT, CHEM 8      Component Value Range   Sodium 126 (*) 135 - 145 (mEq/L)   Potassium 4.7  3.5 - 5.1 (mEq/L)   Chloride 96  96 - 112 (mEq/L)   BUN 35 (*) 6 - 23 (mg/dL)   Creatinine, Ser 9.14  0.50 - 1.10 (mg/dL)   Glucose, Bld 782 (*) 70 - 99 (mg/dL)   Calcium, Ion 9.56  2.13 - 1.32 (mmol/L)   TCO2 22  0 - 100 (mmol/L)   Hemoglobin 8.5 (*) 12.0 - 15.0 (g/dL)   HCT 08.6 (*) 57.8 - 46.0 (%)       MDM  Iv ns. Monitor. Ecg.    Date: 08/03/2011  Rate: 186  Rhythm: narrow complex tachycardia  QRS Axis: normal  Intervals: nct  ST/T Wave abnormalities: nonspecific ST changes  Conduction Disutrbances:nct  Narrative Interpretation:   Old EKG Reviewed: changes noted   Note recheck  bp prior to cardizem, 117/84.  cardizem 10 iv and gtt at 5 mg/hr.  Conversion to sr. Repeat ecg.   Date: 08/03/2011  Rate: 92  Rhythm: normal sinus rhythm  QRS Axis: normal  Intervals: normal  ST/T Wave  abnormalities: nonspecific ST changes  Conduction Disutrbances:first-degree A-V block   Narrative Interpretation:   Old EKG Reviewed: changes noted   Pt anemic, no old values to compare.  Type and cross. Pt/ptt added. Rate improved on cardizem. bp 124/80. Cardiology called-  Discussed pt, rapid afib, hx same, anemia, gi bleed.     Cardiology has seen, rec hold pradaxa, they will consult, but requests med service admit. Discussed w triad, will admit to step down.  CRITICAL CARE Performed by: Suzi Roots   Total critical care time: 45  Critical care time was exclusive of separately billable procedures and treating other patients.  Critical care was necessary to treat or prevent imminent or life-threatening deterioration.  Critical care was time spent personally by me on the following activities: development of treatment plan with patient and/or surrogate as well as nursing, discussions with consultants, evaluation of patient's response to treatment, examination of patient, obtaining history from patient or surrogate, ordering and performing treatments and interventions, ordering and review of laboratory studies, ordering and review of radiographic studies, pulse oximetry and re-evaluation of patient's condition.      Suzi Roots, MD 08/03/11 [phone]

## 2011-08-03 NOTE — ED Notes (Signed)
Patient has hx of back pain.  She was seen by ortho on Friday.  Patient has had mri with dx of compressed fx.  Patient states she has had onset of stomach pain, fullness, and dark stools x 2 days.  Patient has had decreased mobility also since Friday which prompted her to come to ED

## 2011-08-04 ENCOUNTER — Encounter (HOSPITAL_COMMUNITY)
Admission: EM | Disposition: A | Discharge status: Home / Self Care | Payer: Self-pay | Source: Home / Self Care | Attending: Internal Medicine

## 2011-08-04 ENCOUNTER — Encounter (HOSPITAL_COMMUNITY): Payer: Self-pay | Admitting: Gastroenterology

## 2011-08-04 DIAGNOSIS — E871 Hypo-osmolality and hyponatremia: Secondary | ICD-10-CM | POA: Diagnosis present

## 2011-08-04 DIAGNOSIS — I359 Nonrheumatic aortic valve disorder, unspecified: Secondary | ICD-10-CM

## 2011-08-04 DIAGNOSIS — D62 Acute posthemorrhagic anemia: Secondary | ICD-10-CM | POA: Diagnosis present

## 2011-08-04 DIAGNOSIS — M545 Low back pain, unspecified: Secondary | ICD-10-CM | POA: Diagnosis present

## 2011-08-04 DIAGNOSIS — E785 Hyperlipidemia, unspecified: Secondary | ICD-10-CM | POA: Diagnosis present

## 2011-08-04 DIAGNOSIS — I2510 Atherosclerotic heart disease of native coronary artery without angina pectoris: Secondary | ICD-10-CM

## 2011-08-04 DIAGNOSIS — K922 Gastrointestinal hemorrhage, unspecified: Secondary | ICD-10-CM | POA: Diagnosis present

## 2011-08-04 HISTORY — PX: ESOPHAGOGASTRODUODENOSCOPY: SHX5428

## 2011-08-04 LAB — CBC
MCH: 31 pg (ref 26.0–34.0)
MCH: 30.7 pg (ref 26.0–34.0)
MCHC: 34.1 g/dL (ref 30.0–36.0)
MCHC: 34.3 g/dL (ref 30.0–36.0)
Platelets: 222 10*3/uL (ref 150–400)
Platelets: 201 10*3/uL (ref 150–400)
RBC: 2.81 MIL/uL — ABNORMAL LOW (ref 3.87–5.11)
RBC: 3.03 MIL/uL — ABNORMAL LOW (ref 3.87–5.11)
RDW: 13.3 % (ref 11.5–15.5)
RDW: 13.4 % (ref 11.5–15.5)

## 2011-08-04 LAB — CARDIAC PANEL(CRET KIN+CKTOT+MB+TROPI)
CK, MB: 3.4 ng/mL (ref 0.3–4.0)
CK, MB: 3.1 ng/mL (ref 0.3–4.0)
Relative Index: INVALID (ref 0.0–2.5)
Total CK: 85 U/L (ref 7–177)
Total CK: 77 U/L (ref 7–177)

## 2011-08-04 LAB — TSH: TSH: 1.421 u[IU]/mL (ref 0.350–4.500)

## 2011-08-04 SURGERY — EGD (ESOPHAGOGASTRODUODENOSCOPY)
Anesthesia: Moderate Sedation

## 2011-08-04 MED ORDER — FENTANYL CITRATE 0.05 MG/ML IJ SOLN
INTRAMUSCULAR | Status: AC
  Filled 2011-08-04: qty 2

## 2011-08-04 MED ORDER — FENTANYL 25 MCG/HR TD PT72
25.0000 ug | MEDICATED_PATCH | TRANSDERMAL | Status: DC
  Administered 2011-08-04 – 2011-08-13 (×4): 25 ug via TRANSDERMAL
  Filled 2011-08-04 (×5): qty 1

## 2011-08-04 MED ORDER — ZOLPIDEM TARTRATE 5 MG PO TABS
5.0000 mg | ORAL_TABLET | Freq: Every evening | ORAL | Status: DC | PRN
  Administered 2011-08-09 – 2011-08-13 (×5): 5 mg via ORAL
  Filled 2011-08-04 (×7): qty 1

## 2011-08-04 MED ORDER — PANTOPRAZOLE SODIUM 40 MG PO TBEC
40.0000 mg | DELAYED_RELEASE_TABLET | Freq: Two times a day (BID) | ORAL | Status: DC
  Administered 2011-08-04 – 2011-08-14 (×20): 40 mg via ORAL
  Filled 2011-08-04 (×20): qty 1

## 2011-08-04 MED ORDER — AMIODARONE LOAD VIA INFUSION
150.0000 mg | Freq: Once | INTRAVENOUS | Status: AC
  Administered 2011-08-04: 150 mg via INTRAVENOUS
  Filled 2011-08-04 (×2): qty 83.34

## 2011-08-04 MED ORDER — MORPHINE SULFATE 2 MG/ML IJ SOLN
1.0000 mg | INTRAMUSCULAR | Status: DC | PRN
  Filled 2011-08-04: qty 1

## 2011-08-04 MED ORDER — MIDAZOLAM HCL 10 MG/2ML IJ SOLN
INTRAMUSCULAR | Status: AC
  Filled 2011-08-04: qty 2

## 2011-08-04 MED ORDER — DEXTROSE 5 % IV SOLN
60.0000 mg/h | INTRAVENOUS | Status: AC
  Administered 2011-08-04: 60 mg/h via INTRAVENOUS
  Filled 2011-08-04: qty 9

## 2011-08-04 MED ORDER — FENTANYL NICU IV SYRINGE 50 MCG/ML
INJECTION | INTRAMUSCULAR | Status: DC | PRN
  Administered 2011-08-04: 25 ug via INTRAVENOUS

## 2011-08-04 MED ORDER — DEXTROSE 5 % IV SOLN
30.0000 mg/h | INTRAVENOUS | Status: DC
  Administered 2011-08-04 – 2011-08-06 (×5): 30 mg/h via INTRAVENOUS
  Administered 2011-08-07: 60 mg/h via INTRAVENOUS
  Administered 2011-08-08 – 2011-08-09 (×2): 30 mg/h via INTRAVENOUS
  Filled 2011-08-04 (×9): qty 9

## 2011-08-04 MED ORDER — MIDAZOLAM HCL 10 MG/2ML IJ SOLN
INTRAMUSCULAR | Status: DC | PRN
  Administered 2011-08-04: 2 mg via INTRAVENOUS

## 2011-08-04 NOTE — Progress Notes (Signed)
.  Clinical social worker completed patient psychosocial assessment, please see assessment in patient shadow chart.  .Clinical social worker initiated skilled nursing facility search, see placement note in patient shadow chart. CSW will complete FL2 for MD signature. CSW continuing to follow to assist with pt dc plans.   Catha Gosselin, LCSWA  [phone] .08/04/2011 16:06 pm

## 2011-08-04 NOTE — Progress Notes (Signed)
Subjective:    Chain is an 77 year old female with history of coronary artery disease. She has had atrial fibrillation in the past. She was admitted yesterday with acute hypertension, GI bleed, and rapid atrial fibrillation. She also has a fracture of her back.  Donna Ortega was last seen in our office approximately 3 weeks ago. He was in normal sinus rhythm at that time and was not having any problems. For the past several days/weeks she has been having some dark tarry stools. She was admitted yesterday with severe weakness and was found to be hypotensive. Her hemoglobin was 8. She's received transfusion of packed red blood cells.  This morning she remains very tachycardic. Her heart rate ranges from 130-180. She is in atrial fibrillation/atrial flutter. She's been on a Cardizem drip at 2 mg per hour. The drip was slowed significantly because of hypotension.  Donna Ortega denies any episodes of chest discomfort. Her troponin I peaked at 0.34. And all other troponin levels have been negative. Her CPKs remained completely negative.      Marland Kitchen amiodarone  150 mg Intravenous Once  . diltiazem (CARDIZEM) infusion  5 mg/hr Intravenous Once  . diltiazem  10 mg Intravenous Once  . diltiazem  10 mg Intravenous Once  . furosemide  20 mg Intravenous Once  . metoprolol tartrate  12.5 mg Oral BID  .  morphine injection  4 mg Intravenous Once  . ondansetron (ZOFRAN) IV  4 mg Intravenous Once  . pantoprazole (PROTONIX) IV  80 mg Intravenous Once  . sodium chloride  3 mL Intravenous Q12H  . DISCONTD: adenosine (ADENOCARD) IV  12 mg Intravenous Once      . sodium chloride 20 mL/hr at 08/03/11 2200  . sodium chloride Stopped (08/03/11 2258)  . amiodarone (CORDARONE) infusion     And  . amiodarone (CORDARONE) infusion    . diltiazem (CARDIZEM) infusion 2 mg/hr (08/04/11 0400)  . pantoprozole (PROTONIX) infusion 8 mg/hr (08/04/11 0400)    Objective:  Vital Signs in the last 24 hours: Blood pressure 122/49,  pulse 140, temperature 98.1 F (36.7 C), temperature source Oral, resp. rate 22, height 5\' 4"  (1.626 m), weight 153 lb 10.6 oz (69.7 kg), SpO2 98.00%. Temp:  [97.7 F (36.5 C)-99 F (37.2 C)] 98.1 F (36.7 C) (12/20 0800) Pulse Rate:  [53-185] 140  (12/20 0800) Resp:  [13-30] 22  (12/20 0800) BP: (69-139)/(35-88) 122/49 mmHg (12/20 0800) SpO2:  [95 %-100 %] 98 % (12/20 0800) Weight:  [145 lb 1 oz (65.8 kg)-153 lb 10.6 oz (69.7 kg)] 153 lb 10.6 oz (69.7 kg) (12/20 0500)  Intake/Output from previous day: 12/19 0701 - 12/20 0700 In: 1223.6 [I.V.:361.1; Blood:762.5; IV Piggyback:100] Out: 900 [Urine:900] Intake/Output from this shift: Total I/O In: -  Out: 950 [Urine:950]  Physical Exam: The patient is alert and oriented x 3.  The mood and affect are normal.   Skin: warm and dry.    HEENT:   the sclera are nonicteric.  The mucous membranes are moist.  The carotids are 2+ without bruits.  There is no thyromegaly.  There is no JVD.    Lungs: clear anteriorly. She was unable to sit up because of her back pain.   Heart: Irregularly irregular. Her rate is very fast. There is a soft systolic murmur.  Abdomen: good bowel sounds.  There is no guarding or rebound.  There is no hepatosplenomegaly or tenderness.  There are no masses.   Extremities:  no clubbing, cyanosis, or edema.  The legs are without rashes.  The distal pulses are intact.   Neuro:  Cranial nerves II - XII are intact.  Motor and sensory functions are intact.     Lab Results:  Basename 08/03/11 1541 08/03/11 1508  WBC -- 8.5  HGB 8.5* 8.5*  PLT -- 274    Basename 08/03/11 1541  NA 126*  K 4.7  CL 96  CO2 --  GLUCOSE 104*  BUN 35*  CREATININE 0.80    Basename 08/03/11 2227 08/03/11 1509  TROPONINI <0.30 0.34*    Lab Results  Component Value Date   CHOL 177 07/15/2011   HDL 96.40 07/15/2011   LDLCALC 74 07/15/2011   TRIG 32.0 07/15/2011   CHOLHDL 2 07/15/2011    Basename 08/03/11 1711  INR 1.67*      Telemetry: Atrial fibrillation/atrial flutter with a rapid ventricular response   Assessment/Plan:   1. Atrial fibrillation: The patient has recurrent atrial fibrillation/atrial flutter. I suspect this is due to the stress of her GI bleed and subsequent hypotension. She has not responded adequately to Cardizem mainly because of her limited due to her hypotension. She said she certainly might need additional transfusions of packed red blood cells.  Her TSH is normal.  An echocardiogram has been done but the results are not back yet.  We'll start her on amiodarone IV. She's scheduled have a hemoglobin drawn within the hour.  I doubt that we will be able to achieve adequate control of her atrial fibrillation as long as she is anemic and hypotensive.   We'll start her on IV amiodarone today. She is to remain n.p.o. She will likely need an upper endoscopy today.    2. Coronary artery disease: The patient is status post coronary artery bypass grafting. Her CPK levels are normal. Her troponin levels are only barely elevated at 0.34. This is due to the rapid atrial fibrillation. This is not consistent with an acute coronary syndrome. I do not think that she needs a cardiac catheterization based on these results. If she has any chest pain or higher cardiac enzymes and we certainly will consider further evaluation at this point I do not think that she needs a stress test or cardiac catheterization.  3. GI bleed: She'll need further followup from GI.  We have stopped her Pradaxa.  [name], Montez Hageman., MD, [facility] 08/04/2011, 8:10 AM LOS: Day 1

## 2011-08-04 NOTE — Op Note (Signed)
Documentation done in endoPro; see under Procedures tab.  

## 2011-08-04 NOTE — Progress Notes (Deleted)
Clinical social worker aware of potential skilled nursing placement needs. CSW awaiting physical therapy evaluation.   Catha Gosselin, Theresia Majors  [phone] .08/04/2011 15:57pm

## 2011-08-04 NOTE — Progress Notes (Signed)
   CARE MANAGEMENT NOTE 08/04/2011  Patient:  Donna Ortega, Donna Ortega   Account Number:  [id number]  Date Initiated:  08/04/2011  Documentation initiated by:  Onnie Boer  Subjective/Objective Assessment:   PT WAS ADMITTED WITH A FIB W/ RVR     Action/Plan:   PROGRESSION OF CARE AND DISCHARGE PLANNING   Anticipated DC Date:  08/08/2011   Anticipated DC Plan:  HOME/SELF CARE      DC Planning Services  CM consult      Choice offered to / List presented to:             Status of service:  In process, will continue to follow Medicare Important Message given?   (If response is "NO", the following Medicare IM given date fields will be blank) Date Medicare IM given:   Date Additional Medicare IM given:    Discharge Disposition:    Per UR Regulation:  Reviewed for med. necessity/level of care/duration of stay  Comments:  UR COMPLETED 08/04/2011 Onnie Boer, RN, BSN 1432 PT WAS ADMITTED WITH A FIB W/ RVR, PTA PT WAS FROM HOME WITH SELF CARE

## 2011-08-04 NOTE — Progress Notes (Signed)
The patient tolerated her endoscopy very well.  It showed no bleeding or blood in the stomach. She did have a small, clean-based antral ulcer and a duodenal erosion, consistent with her history of aspirin usage.  Based on the favorable findings of the endoscopy, I think it is very unlikely that she will have any further bleeding.  A rectal exam at the time of the procedure showed brown stool, not really melena, and certainly not bloody in character.  Recommendations:  1. Okay to advance diet and switch to oral PPI (ordered)  2. The patient should be discharged on once daily PPI dosing, and in my opinion, she should remain on that indefinitely as ulcer prophylaxis, assuming that she will be remaining on aspirin in the future.  3. The patient, based on endoscopic appearance, is at low risk for rebleeding and therefore discharge from the GI perspective in the next couple of days would be appropriate, assuming no bleeding in the meantime. Obviously, other issues such as her back and her heart will have to be addressed first.  4. I will request the patient to followup with me in the office in about one month, to discuss possible repeat endoscopy to confirm ulcer healing.  5. Based on her endoscopic appearance, I think that resumption of aspirin and Pradaxa would be permissible from the GI bleeding perspective starting about a week from now. In fact, if there is felt to be a critical need to start them sooner, I think that could be done with a reasonable degree of safety but otherwise I think bleeding a week would be ideal.  6. The patient is requesting that her orthopedist, Dr. Worthy Rancher, be contacted to see if he or one of his partners could see her while she is in the hospital, since her back problem remains her primary complaint  Florencia Reasons, M.D. [phone]

## 2011-08-04 NOTE — Progress Notes (Signed)
Subjective: Alert. Main complaint is of persistent low back pain in the lumbar region. Denies any awareness of tachypalpitations, shortness of breath, chest pain and states she has not had any further bowel movements since admission. She endorse her she never had any frank red bleeding only melanotic stool that after the stool had sat in the toilet for a period of time she noted a leaking out of red color into the toilet bowl.  Objective: Vital signs in last 24 hours: Temp:  [97.7 F (36.5 C)-99 F (37.2 C)] 98 F (36.7 C) (12/20 1045) Pulse Rate:  [53-185] 87  (12/20 1045) Resp:  [13-30] 15  (12/20 1045) BP: (69-139)/(35-88) 128/58 mmHg (12/20 1045) SpO2:  [95 %-100 %] 97 % (12/20 0946) Weight:  [65.8 kg (145 lb 1 oz)-69.7 kg (153 lb 10.6 oz)] 153 lb 10.6 oz (69.7 kg) (12/20 0500) Weight change:  Last BM Date: 08/02/11  Intake/Output from previous day: 12/19 0701 - 12/20 0700 In: 1375.6 [I.V.:513.1; Blood:762.5; IV Piggyback:100] Out: 900 [Urine:900] Intake/Output this shift: Total I/O In: 304 [I.V.:304] Out: 950 [Urine:950]  General appearance: alert, cooperative, appears stated age and mild distress Resp: clear to auscultation bilaterally, she is on room air maintaining O2 saturations of 97%, respiratory effort is nontachypneic and unlabored. Cardio: irregularly irregular rhythm and S1, S2 normal, bedside telemetry demonstrates H. or fibrillation with rapid ventricular response rates varying from 140-180 beats per minute, do to persistent hypotension IV Cardizem has been discontinued in the ER and is currently hanging and IV amiodarone infusion. GI: soft, non-tender; bowel sounds normal; no masses,  no organomegaly, continuous Protonix  infusion and play Extremities: extremities normal, atraumatic, no cyanosis or edema Neurologic: Grossly normal  Lab Results:  Basename 08/04/11 0846 08/03/11 1541 08/03/11 1508  WBC 6.2 -- 8.5  HGB 8.7* 8.5* --  HCT 25.5* 25.0* --  PLT 222 --  274   BMET  Basename 08/03/11 1541  NA 126*  K 4.7  CL 96  CO2 --  GLUCOSE 104*  BUN 35*  CREATININE 0.80  CALCIUM --    Studies/Results: Dg Abd 1 View  08/03/2011  *RADIOLOGY REPORT*  Clinical Data: Rule out free abdominal air  ABDOMEN - 1 VIEW  Comparison: Upright view of the chest same day  Findings: There is a nonspecific nonobstructive bowel gas pattern. Moderate stool and gas noted throughout the colon.  No free abdominal air is noted on the upright view of the chest.  IMPRESSION: Nonspecific nonobstructive bowel gas pattern.  Moderate stool and gas noted throughout the colon.  Original Report Authenticated By: Natasha Mead, M.D.   Dg Chest Port 1 View  08/03/2011  *RADIOLOGY REPORT*  Clinical Data: Chest pain  PORTABLE CHEST - 1 VIEW  Comparison: 10/03/2005  Findings: Cardiomediastinal silhouette is stable.  Status post median sternotomy again noted.  No acute infiltrate or pleural effusion.  No pulmonary edema.  IMPRESSION: No active disease.  Status post median sternotomy again noted.  Original Report Authenticated By: Natasha Mead, M.D.    Medications:  I have reviewed the patient's current medications. Scheduled:   . amiodarone  150 mg Intravenous Once  . diltiazem (CARDIZEM) infusion  5 mg/hr Intravenous Once  . diltiazem  10 mg Intravenous Once  . diltiazem  10 mg Intravenous Once  . fentaNYL  25 mcg Transdermal Q72H  . furosemide  20 mg Intravenous Once  . metoprolol tartrate  12.5 mg Oral BID  .  morphine injection  4 mg Intravenous Once  .  ondansetron (ZOFRAN) IV  4 mg Intravenous Once  . pantoprazole (PROTONIX) IV  80 mg Intravenous Once  . sodium chloride  3 mL Intravenous Q12H  . DISCONTD: adenosine (ADENOCARD) IV  12 mg Intravenous Once    Assessment/Plan:  Principal Problem:  *Hypotension Systolic blood pressure remains variable but overall improved. She has received one unit of packed red blood cells which is having a significant impact on improving  blood pressure. The initiation of Cardizem drip contributed to recurrent hypotension therefore this has been discontinued. In addition since the initiation of the amiodarone drip with decrease in heart rate patient's systolic blood pressure has improved as well. We'll continue to monitor.  Active Problems:  Melena/ GI bleed   Appreciate Dr. Buccini/gastroenterology assistance. Plans are to proceed with EGD today. No further bleeding appreciated since admission. Continue Protonix infusion.  Anemia associated with acute blood loss Baseline hemoglobin is is unknown. Hemoglobin her presentation is 8.5 and after 1 unit of PRBCs has only increased to 8.7. We will proceed with infusion of additional unit of packed red blood cells and we'll repeat a CBC 2 hours after second unit of packed red blood cells have been infused. We'll also follow serial CBCs every 12 hours.   CAD (coronary artery disease) Has been on for Pradexa for over one year but do to current GI bleeding symptoms this medication is on hold. Appreciate Dr. Nahser/cardiology assistance. Initial troponin I in the ER on her point-of-care panel was 0.34 but repeat troponin I with a cardiac panel was normal at less than 0.3. We'll continue to monitor. She denies chest pain or other cardiac ischemic symptoms and her EKG is stable.   Atrial fibrillation with RVR Heart rates have improved with transition from IV Cardizem to IV amiodarone. We'll continue this medication for now. Her history regarding the atrial fibrillation has been of a paroxysmal nature and she was not on Coumadin prior to admission. The cardiologist suspects that current atrial fibrillation exacerbation is due to the combined problems of symptomatic anemia and hypotension. Obviously given her acute GI bleeding she is not a candidate for systemic anticoagulation.   Hyponatremia This was present at admission and is likely secondary to use of thiazide diuretic. We'll continue to  monitor electrolyte panel.   Lumbar back pain/subacute compression fracture This problem has been followed by orthopedic physician prior to admission and she had undergone office-based MRI prior to admission. These results are not in the [facility] system. Patient is still endorsing significant and adequate pain control. We will start a fentanyl patch today and since she is n.p.o. provide when necessary IV morphine. What she is allowed a diet we'll continue the fentanyl patch but resume oral narcotics. When more hemodynamically stable we'll proceed with physical therapy evaluation.   HTN (hypertension) Currently hypotensive do to GI bleeding and anemia. Home medications include hydrochlorothiazide, lisinopril and metoprolol. Once blood pressure has improved can resume these medications. We'll likely need to transition off amiodarone before resuming metoprolol.   Dyslipidemia She was taking pravastatin preadmission, no need to resume this medication until tolerating oral diet.   Disposition Due to ongoing atrial fibrillation with RVR requiring IV amiodarone as well as potential recurrent GI bleeding patient were made in the step down ICU.   LOS: 1 day   Junious Silk, ANP pager [phone]  [facility] 8 [email or web address] Password: [email or web address]  08/04/2011, 11:11 AM  Addendum: 1442: Dr. Matthias Hughs graciously gave me a call this afternoon. He informing his chance endoscopy  was negative for any acute or active bleeding. She had several clean ulcers. He recommended indefinite use of proton pump inhibitor. He recommended holding aspirin and Robaxin for one week. He recommended a followup endoscopy in a few months. He plans on advancing her diet today and at least from a gastroenterology standpoint she has been cleared for discharge when medically stable otherwise.

## 2011-08-04 NOTE — Progress Notes (Signed)
I have examined the patient and reviewed the chart. EGD reveals gastric ulcer and duodenal erosions. No bleeding noted at the time.  Please see recommendation by Dr Matthias Hughs.  Will d/c Protonix drip, start po BID Protonix.  No anticoagulation for 5-7 days.  D/C BID CBCs as she is not bleeding.  Will start clear liquids. Decrease IVF to 100 cc/hr

## 2011-08-04 NOTE — Consult Note (Signed)
Referring Provider:    Dr. Susie Cassette ([facility] hospital[facility]) Primary Care Physician:  Aura Dials, MD Primary Gastroenterologist:  None  Reason for Consultation:  Melena, anemia  HPI: Donna Ortega is a 77 y.o. female admitted through the emergency room yesterday when she presented with weakness and was found to be in uncontrolled, rapid atrial fibrillation, and also gave a history of dark stools for the past 5 or 6 days, without prodromal dyspeptic symptoms. There was no syncope.  Patient has no prior history of ulcers or GI bleeding, but was on aspirin and Pradaxa, without PPI coverage, prior to admission.  Her cardiologist indicates that she was in sinus rhythm several weeks ago when last seen in the office, suggesting that the GI bleed may have been the precipitating factor for her flipping into atrial fibrillation. Attempts to control the rate of the rapid A. fib overnight with a Cardizem drip have been limited due to the patient's hypotension.   She is currently receiving her second unit of packed cells. Her admission hemoglobin of 8.5 rose only to 8.7 this morning after 1 unit of packed cells, prompting transfusion of another unit. He  Her BUN this morning is 35. Apparently it was not checked yesterday.  The patient indicates she had a negative colonoscopy 4 years ago, although she does not recall which physician did that examination.  Past Medical History  Diagnosis Date  . Coronary artery disease 2007    3 vessel CABG with LIMA-LAD, SVG to diag, and SVG - Ramus  . Hyperlipidemia   . Hypertension   . Atrial fibrillation     paroxysmal  . History of hyperkalemia   . Hyponatremia   . Diverticulitis     Past Surgical History  Procedure Date  . Maze   . Coronary artery bypass graft   . Partial hysterectomy     Prior to Admission medications   Medication Sig Start Date End Date Taking? Authorizing Provider  acetaminophen (TYLENOL) 325 MG tablet Take 650 mg by mouth every 6 (six)  hours as needed.     Yes Historical Provider, MD  aspirin 81 MG tablet Take 81 mg by mouth daily.     Yes Historical Provider, MD  Calcium Carbonate-Vitamin D (CALTRATE 600+D PO) Take by mouth daily.     Yes Historical Provider, MD  dabigatran (PRADAXA) 150 MG CAPS Take 150 mg by mouth every 12 (twelve) hours.     Yes Historical Provider, MD  hydrochlorothiazide (HYDRODIURIL) 25 MG tablet TAKE 1/2 TABLET BY MOUTH EVERY DAY 08/01/11  Yes Elyn Aquas., MD  HYDROcodone-acetaminophen [facility]) 5-325 MG per tablet Take 1 tablet by mouth every 6 (six) hours as needed. For pain    Yes Historical Provider, MD  lisinopril (PRINIVIL,ZESTRIL) 40 MG tablet Take 40 mg by mouth daily.     Yes Historical Provider, MD  metoprolol tartrate (LOPRESSOR) 25 MG tablet Take 1 tablet (25 mg total) by mouth 2 (two) times daily. 01/03/11 01/03/12 Yes Elyn Aquas., MD  Multiple Vitamin (MULTIVITAMIN) tablet Take 1 tablet by mouth daily.     Yes Historical Provider, MD  pravastatin (PRAVACHOL) 40 MG tablet Take 40 mg by mouth daily.     Yes Historical Provider, MD  oxyCODONE-acetaminophen (PERCOCET) 5-325 MG per tablet Take 1 tablet by mouth every 4 (four) hours as needed. For pain     Historical Provider, MD    Current Facility-Administered Medications  Medication Dose Route Frequency Provider Last Rate Last Dose  . 0.9 %  sodium chloride infusion   Intravenous Continuous Suzi Roots, MD 20 mL/hr at 08/03/11 2200    . 0.9 %  sodium chloride infusion  250 mL Intravenous PRN Nayana Abrol 10 mL/hr at 08/04/11 0300 250 mL at 08/04/11 0300  . 0.9 %  sodium chloride infusion   Intravenous Continuous Nayana Abrol      . acetaminophen (TYLENOL) tablet 650 mg  650 mg Oral Q6H PRN Nayana Abrol       Or  . acetaminophen (TYLENOL) suppository 650 mg  650 mg Rectal Q6H PRN Nayana Abrol      . albuterol (PROVENTIL) (5 MG/ML) 0.5% nebulizer solution 2.5 mg  2.5 mg Nebulization Q2H PRN Nayana Abrol      . amiodarone  (CORDARONE) 1.8 mg/mL load via infusion 150 mg  150 mg Intravenous Once Elyn Aquas., MD 500 mL/hr at 08/04/11 0903 150 mg at 08/04/11 1610   And  . amiodarone (CORDARONE) 450 mg in dextrose 5 % 250 mL infusion  60 mg/hr Intravenous Continuous Elyn Aquas., MD 33.3 mL/hr at 08/04/11 0859 60 mg/hr at 08/04/11 0859   And  . amiodarone (CORDARONE) 450 mg in dextrose 5 % 250 mL infusion  30 mg/hr Intravenous Continuous Elyn Aquas., MD      . diltiazem (CARDIZEM) 100 mg in dextrose 5 % 100 mL infusion  5 mg/hr Intravenous Once Suzi Roots, MD 5 mL/hr at 08/03/11 1650 5 mg/hr at 08/03/11 1650  . diltiazem (CARDIZEM) 100 mg in dextrose 5 % 100 mL infusion  2-15 mg/hr Intravenous Titrated Maren Reamer, NP 2 mL/hr at 08/04/11 0400 2 mg/hr at 08/04/11 0400  . diltiazem (CARDIZEM) injection 10 mg  10 mg Intravenous Once Suzi Roots, MD   10 mg at 08/03/11 1545  . diltiazem (CARDIZEM) injection 10 mg  10 mg Intravenous Once Suzi Roots, MD   10 mg at 08/03/11 2018  . fentaNYL (DURAGESIC - dosed mcg/hr) patch 25 mcg  25 mcg Transdermal Q72H Allison L. Rennis Harding, NP      . furosemide (LASIX) injection 20 mg  20 mg Intravenous Once Nayana Abrol   20 mg at 08/04/11 0554  . metoprolol tartrate (LOPRESSOR) tablet 12.5 mg  12.5 mg Oral BID Nayana Abrol   12.5 mg at 08/03/11 2148  . morphine 2 MG/ML injection 1 mg  1 mg Intravenous Q3H PRN Maren Reamer, NP   1 mg at 08/04/11 0100  . morphine 2 MG/ML injection 1 mg  1 mg Intravenous Q2H PRN Allison L. Rennis Harding, NP      . morphine 4 MG/ML injection 4 mg  4 mg Intravenous Once Suzi Roots, MD   4 mg at 08/03/11 1649  . ondansetron (ZOFRAN) injection 4 mg  4 mg Intravenous Once Suzi Roots, MD   4 mg at 08/03/11 1649  . ondansetron (ZOFRAN) tablet 4 mg  4 mg Oral Q6H PRN Nayana Abrol       Or  . ondansetron (ZOFRAN) injection 4 mg  4 mg Intravenous Q6H PRN Nayana Abrol      . oxyCODONE-acetaminophen (PERCOCET) 5-325 MG per tablet 1 tablet  1 tablet  Oral Q6H PRN Nayana Abrol   1 tablet at 08/04/11 0330  . pantoprazole (PROTONIX) 80 mg in sodium chloride 0.9 % 100 mL IVPB  80 mg Intravenous Once Nayana Abrol   80 mg at 08/03/11 2116  . pantoprazole (PROTONIX) 80 mg in sodium chloride 0.9 % 250 mL infusion  8 mg/hr  Intravenous Continuous Nayana Abrol 25 mL/hr at 08/04/11 0400 8 mg/hr at 08/04/11 0400  . sodium chloride 0.9 % injection 3 mL  3 mL Intravenous Q12H Nayana Abrol   3 mL at 08/03/11 2202  . sodium chloride 0.9 % injection 3 mL  3 mL Intravenous PRN Nayana Abrol      . DISCONTD: adenosine (ADENOCARD) 12 MG/4ML injection 12 mg  12 mg Intravenous Once Lyanne Co, MD        Allergies as of 08/03/2011 - Review Complete 08/03/2011  Allergen Reaction Noted  . Lipitor (atorvastatin calcium)  01/06/2011  . Niaspan (niacin (antihyperlipidemic)) Rash 01/06/2011    Family History  Problem Relation Age of Onset  . Heart failure Father   . Kidney failure Father   . Bone cancer Brother   . Kidney failure Sister   . Colon cancer Sister   . Cancer Sister     History   Social History  . Marital Status: Single    Spouse Name: N/A    Number of Children: N/A  . Years of Education: N/A   Occupational History  . Not on file.   Social History Main Topics  . Smoking status: Never Smoker   . Smokeless tobacco: Not on file  . Alcohol Use: No  . Drug Use: No  . Sexually Active: No   Other Topics Concern  . Not on file   Social History Narrative  . No narrative on file    Review of Systems: Positive for: "Incomplete bladder emptying" with frequent urination CV:  Denies chest pain, angina, palpitations, syncope, orthopnea, PND Resp: denies dyspnea, cough,  coughing up blood. GI: Denies dysphagia,  abdominal pain, nausea, vomiting, vomiting blood, previous black stools,  constipation, diarrhea, and fecal incontinence.    GU : Denies urinary burning, blood in urine, urinary hesitancy, nocturnal urination, and urinary  incontinence. MS: Denies joint pain or swelling except in her hands in the morning time.  Psych: Denies depression, anxiety, memory loss, suicidal ideation, hallucinations  Physical Exam: Vital signs in last 24 hours: Temp:  [97.7 F (36.5 C)-99 F (37.2 C)] 97.7 F (36.5 C) (12/20 0946) Pulse Rate:  [53-185] 99  (12/20 0946) Resp:  [13-30] 16  (12/20 0946) BP: (69-139)/(35-88) 92/59 mmHg (12/20 0946) SpO2:  [95 %-100 %] 97 % (12/20 0946) Weight:  [65.8 kg (145 lb 1 oz)-69.7 kg (153 lb 10.6 oz)] 153 lb 10.6 oz (69.7 kg) (12/20 0500) Last BM Date: 08/02/11 General:   Alert,  Well-developed, well-nourished, pleasant and cooperative in NAD Head:  Normocephalic and atraumatic. Eyes:  Sclera clear, no icterus.   Conjunctiva pink. Mouth:   No ulcerations or lesions.  Oropharynx pink & moist. Neck:   No masses or thyromegaly. Lungs:  Clear throughout to auscultation.   No wheezes, crackles, or rhonchi. No evident respiratory distress. Heart:   Very rapid rate, cannot really assess for clicks, rubs,  or gallops. No overt murmur appreciated. Abdomen:  nontender,  and nondistended. No masses, hepatosplenomegaly or ventral hernias noted. Normal bowel sounds, without bruits, guarding, or rebound.   Msk:   Symmetrical without gross deformities. Extremities:   Without clubbing, cyanosis, or edema. Neurologic:  Alert and coherent;  grossly normal neurologically. Skin:  Intact without significant lesions or rashes. Cervical Nodes:  No significant cervical adenopathy. Psych:   Alert and cooperative. Normal mood and affect.  Intake/Output from previous day: 12/19 0701 - 12/20 0700 In: 1375.6 [I.V.:513.1; Blood:762.5; IV Piggyback:100] Out: 900 [Urine:900] Intake/Output this  shift: Total I/O In: 304 [I.V.:304] Out: 950 [Urine:950]  Lab Results:  Basename 08/04/11 0846 08/03/11 1541 08/03/11 1508  WBC 6.2 -- 8.5  HGB 8.7* 8.5* 8.5*  HCT 25.5* 25.0* 24.6*  PLT 222 -- 274   BMET  Basename  08/03/11 1541  NA 126*  K 4.7  CL 96  CO2 --  GLUCOSE 104*  BUN 35*  CREATININE 0.80  CALCIUM --   LFT No results found for this basename: PROT,ALBUMIN,AST,ALT,ALKPHOS,BILITOT,BILIDIR,IBILI in the last 72 hours PT/INR  Basename 08/03/11 1711  LABPROT 20.0*  INR 1.67*    Studies/Results: Dg Abd 1 View  08/03/2011  *RADIOLOGY REPORT*  Clinical Data: Rule out free abdominal air  ABDOMEN - 1 VIEW  Comparison: Upright view of the chest same day  Findings: There is a nonspecific nonobstructive bowel gas pattern. Moderate stool and gas noted throughout the colon.  No free abdominal air is noted on the upright view of the chest.  IMPRESSION: Nonspecific nonobstructive bowel gas pattern.  Moderate stool and gas noted throughout the colon.  Original Report Authenticated By: Natasha Mead, M.D.   Dg Chest Port 1 View  08/03/2011  *RADIOLOGY REPORT*  Clinical Data: Chest pain  PORTABLE CHEST - 1 VIEW  Comparison: 10/03/2005  Findings: Cardiomediastinal silhouette is stable.  Status post median sternotomy again noted.  No acute infiltrate or pleural effusion.  No pulmonary edema.  IMPRESSION: No active disease.  Status post median sternotomy again noted.  Original Report Authenticated By: Natasha Mead, M.D.    Impression:  1. GI bleeding, probably upper tract source, most likely related to aspirin gastropathy, magnified by anticoagulation therapy with Pradaxa, characterized by melenic stool and elevated BUN, moderate hemodynamic instability, probably related to her flipping into rapid atrial fibrillation 2. Posthemorrhagic anemia 3. History of diverticulitis  4. Up-to-date on screening colonoscopy by her report, records not available  Plan:  endoscopic evaluation later this morning. Ashby Dawes, purpose, and risks reviewed with the patient and she is agreeable. Agree with transfusion, Protonix infusion in the meantime, as well as holding her aspirin.   LOS: 1 day   Andree Heeg V  08/04/2011, 10:43  AM

## 2011-08-04 NOTE — Op Note (Signed)
[facility] [address] [place], [place]  [address]  ENDOSCOPY PROCEDURE REPORT  PATIENT:  Donna Ortega, Donna Ortega  MR#:  [id number] BIRTHDATE:  [birth year], 85 yrs. old  GENDER:  female  ENDOSCOPIST:  Bernette Redbird, MD Referred by:   unassigned --primary physician is Dr. Tracey Harries   PROCEDURE DATE:  08/04/2011 PROCEDURE:  Upper endoscopy ASA CLASS: INDICATIONS:        melenic stool and anemia in a patient on aspirin and Pradaxa  MEDICATIONS:  Fentanyl 25 mcg IV, Versed 2 mg IV TOPICAL ANESTHETIC:  None  DESCRIPTION OF PROCEDURE:   After the risks and benefits of the procedure were explained, informed consent was obtained.  The Pentax Gastroscope [id number] endoscope was introduced through the mouth and advanced to the second duodenum .  The instrument was slowly withdrawn as the mucosa was fully examined. <<PROCEDUREIMAGES>> The esophagus was entered without undue difficulty. There was a widely patent but well-defined Schatzki's ring at the squamocolumnar junction. Below this was a 3 cm hiatal hernia. The esophagus was otherwise normal, specifically without evidence of varices, infection, neoplasia, inflammation, Barrett's mucosa, or Mallory-Weiss tear.  The stomach was entered and noted to contain absolutely no blood or coffee-ground material.  The antrum of the stomach had some punctate erythema, and there was a 6 mm clean-based ulcer on the antrum superior aspect of the antrum. It did not have any stigmata of recent hemorrhage. The remainder of the stomach was normal, including a retroflex view of the cardia..  The pylorus and duodenal bulb were normal. In the proximal second portion of the duodenum, I encountered 2 very small erosions, each about 3 mm in greatest dimension. These again were clean-based, without any stigmata of hemorrhage. The remainder of the second portion and proximal third portion of the duodenum were unremarkable  No biopsies  were obtained. The scope was then withdrawn from the patient and the procedure completed.  COMPLICATIONS:  None  ENDOSCOPIC IMPRESSION:  1. No active bleeding or blood in the stomach at the time of this examination. 2. Small, clean-based gastric ulcer and 2 small duodenal erosions. Also, antral erythema. All of these findings are consistent with the patient's known aspirin exposure. 3. It is felt most likely that the patient's bleeding came from the gastric ulcer. Of note, the patient had dark stools beginning approximately one week prior to this procedure, which might indicate that the bleeding occurred at that time, thus accounting for the relatively clean appearance of the ulcer at this time, and specifically, the absence of any stigmata of recent hemorrhage. 4. Schatzki's ring, which is asymptomatic by history.  RECOMMENDATIONS:  1. Based on the favorable endoscopic appearance, it appears that the patient is at low risk for further bleeding from the upper GI tract. Accordingly, I feel it is appropriate to advance the patient's diet and change her to oral PPI therapy. 2. Ideally, the patient would be kept off aspirin and Pradaxa for the next 5-7 days, although if it is felt critical to start him sooner, I feel that could be done with a fairly high degree of safety. 3. I would treat the patient with twice daily PPIs while in house, but I feel she can be discharged on once daily therapy. I would keep her on once daily PPI therapy indefinitely to help prophylax against future ulcers, assuming that she will need lifelong aspirin therapy, which would place her at risk for ulcer formation. 4. Based on the favorable endoscopic findings, I  feel that discharge from the GI perspective would be safe in the next couple of days, assuming there is no further bleeding in the interim. 5. I have made the patient an appointment to see me in the office about a month from now to check  Hemoccult status. If she remains Hemoccult positive, updated evaluation of the lower GI tract may be needed. Her son indicates her last colonoscopy was about 2 years ago, and was performed in [place], [name]. 6. I would favor repeat endoscopy in 2 months to confirm ulcer healing. This can be discussed with the patient at her upcoming office visit with me.  ____________________________ Bernette Redbird, MD  CC:  n. [email or web address] Maduro Homer Miller at 08/04/2011 03:22 PM  Linton Rump, [id number]

## 2011-08-04 NOTE — H&P (Signed)
History and Physical Interval Note:  08/04/2011 1:37 PM  Donna Ortega  has presented today for endoscopy, with the diagnosis of melena  The various methods of treatment have been discussed with the patient. After consideration of risks, benefits and other options for treatment, the patient has consented to  Procedure(s): ESOPHAGOGASTRODUODENOSCOPY (EGD) as a surgical intervention .  The patients' history has been reviewed, patient examined, no change in status, stable for surgery.  I have reviewed the patients' chart and labs.  Questions were answered to the patient's satisfaction.     Florencia Reasons

## 2011-08-04 NOTE — Progress Notes (Signed)
  Echocardiogram 2D Echocardiogram has been performed.  Jeryl Columbia R 08/04/2011, 10:45 AM

## 2011-08-05 ENCOUNTER — Inpatient Hospital Stay (HOSPITAL_COMMUNITY): Payer: No Typology Code available for payment source

## 2011-08-05 ENCOUNTER — Encounter (HOSPITAL_COMMUNITY): Payer: Self-pay | Admitting: Gastroenterology

## 2011-08-05 DIAGNOSIS — N183 Chronic kidney disease, stage 3 unspecified: Secondary | ICD-10-CM | POA: Diagnosis present

## 2011-08-05 LAB — BASIC METABOLIC PANEL
Chloride: 96 mEq/L (ref 96–112)
GFR calc Af Amer: 88 mL/min — ABNORMAL LOW (ref >90)
GFR calc non Af Amer: 76 mL/min — ABNORMAL LOW (ref >90)
Potassium: 3.7 mEq/L (ref 3.5–5.1)
Sodium: 126 mEq/L — ABNORMAL LOW (ref 135–145)

## 2011-08-05 LAB — CBC
Hemoglobin: 10.4 g/dL — ABNORMAL LOW (ref 12.0–15.0)
MCH: 31.1 pg (ref 26.0–34.0)
MCHC: 34.3 g/dL (ref 30.0–36.0)
Platelets: 181 10*3/uL (ref 150–400)
Platelets: 219 10*3/uL (ref 150–400)
RDW: 13.6 % (ref 11.5–15.5)
RDW: 13.6 % (ref 11.5–15.5)
WBC: 7.1 10*3/uL (ref 4.0–10.5)

## 2011-08-05 LAB — PREPARE RBC (CROSSMATCH)

## 2011-08-05 LAB — PREPARE FRESH FROZEN PLASMA: Unit division: 0

## 2011-08-05 MED ORDER — METOPROLOL TARTRATE 25 MG PO TABS
25.0000 mg | ORAL_TABLET | Freq: Two times a day (BID) | ORAL | Status: DC
  Administered 2011-08-05 – 2011-08-06 (×3): 25 mg via ORAL
  Filled 2011-08-05 (×4): qty 1

## 2011-08-05 MED ORDER — SIMVASTATIN 20 MG PO TABS
20.0000 mg | ORAL_TABLET | Freq: Every day | ORAL | Status: DC
  Administered 2011-08-05 – 2011-08-06 (×2): 20 mg via ORAL
  Filled 2011-08-05 (×3): qty 1

## 2011-08-05 MED ORDER — WHITE PETROLATUM GEL
Status: AC
  Administered 2011-08-05: 13:00:00
  Filled 2011-08-05: qty 5

## 2011-08-05 MED ORDER — PANTOPRAZOLE SODIUM 40 MG PO TBEC: 40.0000 mg | DELAYED_RELEASE_TABLET | Freq: Two times a day (BID) | ORAL | Status: DC

## 2011-08-05 MED ORDER — OXYCODONE-ACETAMINOPHEN 5-325 MG PO TABS
1.0000 | ORAL_TABLET | ORAL | Status: DC | PRN
  Administered 2011-08-05 – 2011-08-09 (×5): 1 via ORAL
  Filled 2011-08-05 (×5): qty 1

## 2011-08-05 NOTE — H&P (View-Only) (Signed)
  Subjective:    Donna Ortega is an 77-year-old female with history of coronary artery disease. She has had atrial fibrillation in the past. She was admitted yesterday with acute hypertension, GI bleed, and rapid atrial fibrillation. She also has a fracture of her back.  Upper endoscopy 12/20 revealed a gastric ulcer - not actively bleeding.  Dr. Buccini has recommended that we hold Pradaxa and ASA for a week.  12/21 - She remains in A-fib with a rapid rate.  She was started on Amiodarone IV.  Her Hb remains marginal and Int. Med will be giving her another unit of PRBC.      . amiodarone  150 mg Intravenous Once  . fentaNYL  25 mcg Transdermal Q72H  . metoprolol tartrate  25 mg Oral BID  . pantoprazole  40 mg Oral BID AC  . pantoprazole  40 mg Oral BID AC  . sodium chloride  3 mL Intravenous Q12H  . DISCONTD: metoprolol tartrate  12.5 mg Oral BID      . sodium chloride 100 mL/hr at 08/05/11 0059  . amiodarone (CORDARONE) infusion 60 mg/hr (08/04/11 0859)   And  . amiodarone (CORDARONE) infusion 30 mg/hr (08/05/11 0655)  . DISCONTD: sodium chloride 20 mL/hr at 08/03/11 2200  . DISCONTD: diltiazem (CARDIZEM) infusion 2 mg/hr (08/04/11 0400)  . DISCONTD: pantoprozole (PROTONIX) infusion 8 mg/hr (08/04/11 0400)    Objective:  Vital Signs in the last 24 hours: Blood pressure 122/63, pulse 117, temperature 97.9 F (36.6 C), temperature source Oral, resp. rate 22, height 5' 4" (1.626 m), weight 151 lb 14.4 oz (68.9 kg), SpO2 97.00%. Temp:  [97.7 F (36.5 C)-99.3 F (37.4 C)] 97.9 F (36.6 C) (12/21 0700) Pulse Rate:  [60-168] 117  (12/21 0700) Resp:  [11-27] 22  (12/21 0700) BP: (91-128)/(47-91) 122/63 mmHg (12/21 0700) SpO2:  [88 %-98 %] 97 % (12/21 0700) Weight:  [151 lb 14.4 oz (68.9 kg)] 151 lb 14.4 oz (68.9 kg) (12/21 0042)  Intake/Output from previous day: 12/20 0701 - 12/21 0700 In: 3666.7 [I.V.:3341.7] Out: 2950 [Urine:2950] Intake/Output from this shift: Total I/O In:  116.7 [I.V.:116.7] Out: 100 [Urine:100]  Physical Exam: The patient is alert and oriented x 3.  The mood and affect are normal.   Skin: warm and dry.    HEENT:   the sclera are nonicteric.  The mucous membranes are moist.  The carotids are 2+ without bruits.  There is no thyromegaly.  There is no JVD.    Lungs: clear anteriorly. She was unable to sit up because of her back pain.   Heart: Irregularly irregular. Her rate is very fast. There is a soft systolic murmur.  Abdomen: good bowel sounds.  There is no guarding or rebound.  There is no hepatosplenomegaly or tenderness.  There are no masses.   Extremities:  no clubbing, cyanosis, or edema.  The legs are without rashes.  The distal pulses are intact.   Neuro:  Cranial nerves II - XII are intact.  Motor and sensory functions are intact.     Lab Results:  Basename 08/05/11 0427 08/04/11 1233  WBC 7.1 7.2  HGB 8.7* 9.3*  PLT 181 201    Basename 08/05/11 0427 08/03/11 1541  NA 126* 126*  K 3.7 4.7  CL 96 96  CO2 26 --  GLUCOSE 97 104*  BUN 16 35*  CREATININE 0.75 0.80    Basename 08/04/11 1546 08/04/11 0846  TROPONINI <0.30 <0.30    Lab Results  Component   Value Date   CHOL 177 07/15/2011   HDL 96.40 07/15/2011   LDLCALC 74 07/15/2011   TRIG 32.0 07/15/2011   CHOLHDL 2 07/15/2011    Basename 08/03/11 1711  INR 1.67*    Telemetry: Atrial fibrillation/atrial flutter with a rapid ventricular response   Assessment/Plan:   1. Atrial fibrillation: The patient has recurrent atrial fibrillation/atrial flutter. I suspect this is due to the stress of her GI bleed and subsequent hypotension. She has not responded adequately to Cardizem mainly because of her limited due to her hypotension.  The Amiodarone is helping to a minimal degree.  I think she will improved once her Hb is normal .  I agree with Int. Med with the  Increase in  Metoprolol   2. Coronary artery disease: The patient is status post coronary artery  bypass grafting. Her CPK levels are normal. Her troponin levels are only barely elevated at 0.34. This is due to the rapid atrial fibrillation. This is not consistent with an acute coronary syndrome. I do not think that she needs a cardiac catheterization based on these results. If she has any chest pain or higher cardiac enzymes and we certainly will consider further evaluation at this point I do not think that she needs a stress test or cardiac catheterization.  I agree with holding ASA for a week -  She was on ASA 325 as op.  We will decrease dose to 81 mg a day  3. GI bleed: She'll need further followup from GI.  We have stopped her Pradaxa.  Will anticipate restarting in a week.  [place]wood J. Thurlow Gallaga, Jr., MD, FACC 08/05/2011, 8:59 AM LOS: Day 2      

## 2011-08-05 NOTE — Consult Note (Signed)
Reason for Consult:Back Pain Referring Physician: Dr.Devine  Donna Ortega is an 76 y.o. female.  HPI: Patient with a Severe Compression Fracture of L-1 and negative evaluation for Multiple Myeloma and proven on MRI that she has a severe Compression Fracture of L-1.  Past Medical History  Diagnosis Date  . Coronary artery disease 2007    3 vessel CABG with LIMA-LAD, SVG to diag, and SVG - Ramus  . Hyperlipidemia   . Hypertension   . Atrial fibrillation     paroxysmal  . History of hyperkalemia   . Hyponatremia   . Diverticulitis     Past Surgical History  Procedure Date  . Maze   . Coronary artery bypass graft   . Partial hysterectomy   . Esophagogastroduodenoscopy 08/04/2011    Procedure: ESOPHAGOGASTRODUODENOSCOPY (EGD);  Surgeon: Florencia Reasons, MD;  Location: [facility] ENDOSCOPY;  Service: Endoscopy;  Laterality: N/A;  do at bedside    Family History  Problem Relation Age of Onset  . Heart failure Father   . Kidney failure Father   . Bone cancer Brother   . Kidney failure Sister   . Colon cancer Sister   . Cancer Sister     Social History:  reports that she has never smoked. She does not have any smokeless tobacco history on file. She reports that she does not drink alcohol or use illicit drugs.  Allergies:  Allergies  Allergen Reactions  . Lipitor (Atorvastatin Calcium)     MUSCLE ACHES  . Niaspan (Niacin (Antihyperlipidemic)) Rash    Medications: I have reviewed the patient's current medications.  Results for orders placed during the hospital encounter of 08/03/11 (from the past 48 hour(s))  PREPARE FRESH FROZEN PLASMA     Status: Normal   Collection Time   08/03/11  7:01 PM      Component Value Range Comment   Unit Number [id number]      Blood Component Type THAWED PLASMA      Unit division 00      Status of Unit ISSUED,FINAL      Transfusion Status OK TO TRANSFUSE      Unit Number [id number]      Blood Component Type THAWED PLASMA      Unit division 00       Status of Unit ISSUED,FINAL      Transfusion Status OK TO TRANSFUSE     MRSA PCR SCREENING     Status: Normal   Collection Time   08/03/11  8:48 PM      Component Value Range Comment   MRSA by PCR NEGATIVE  NEGATIVE    CARDIAC PANEL(CRET KIN+CKTOT+MB+TROPI)     Status: Normal   Collection Time   08/03/11 10:27 PM      Component Value Range Comment   Total CK 75  7 - 177 (U/L)    CK, MB 3.4  0.3 - 4.0 (ng/mL)    Troponin I <0.30  <0.30 (ng/mL)    Relative Index RELATIVE INDEX IS INVALID  0.0 - 2.5    CBC     Status: Abnormal   Collection Time   08/04/11  8:46 AM      Component Value Range Comment   WBC 6.2  4.0 - 10.5 (K/uL)    RBC 2.81 (*) 3.87 - 5.11 (MIL/uL)    Hemoglobin 8.7 (*) 12.0 - 15.0 (g/dL)    HCT 78.2 (*) 95.6 - 46.0 (%)    MCV 90.7  78.0 - 100.0 (fL)  MCH 31.0  26.0 - 34.0 (pg)    MCHC 34.1  30.0 - 36.0 (g/dL)    RDW 16.1  09.6 - 04.5 (%)    Platelets 222  150 - 400 (K/uL)   CARDIAC PANEL(CRET KIN+CKTOT+MB+TROPI)     Status: Normal   Collection Time   08/04/11  8:46 AM      Component Value Range Comment   Total CK 85  7 - 177 (U/L)    CK, MB 3.4  0.3 - 4.0 (ng/mL)    Troponin I <0.30  <0.30 (ng/mL)    Relative Index RELATIVE INDEX IS INVALID  0.0 - 2.5    TSH     Status: Normal   Collection Time   08/04/11  8:46 AM      Component Value Range Comment   TSH 1.421  0.350 - 4.500 (uIU/mL)   CBC     Status: Abnormal   Collection Time   08/04/11 12:33 PM      Component Value Range Comment   WBC 7.2  4.0 - 10.5 (K/uL)    RBC 3.03 (*) 3.87 - 5.11 (MIL/uL)    Hemoglobin 9.3 (*) 12.0 - 15.0 (g/dL)    HCT 40.9 (*) 81.1 - 46.0 (%)    MCV 89.4  78.0 - 100.0 (fL)    MCH 30.7  26.0 - 34.0 (pg)    MCHC 34.3  30.0 - 36.0 (g/dL)    RDW 91.4  78.2 - 95.6 (%)    Platelets 201  150 - 400 (K/uL)   CARDIAC PANEL(CRET KIN+CKTOT+MB+TROPI)     Status: Normal   Collection Time   08/04/11  3:46 PM      Component Value Range Comment   Total CK 77  7 - 177 (U/L)    CK,  MB 3.1  0.3 - 4.0 (ng/mL)    Troponin I <0.30  <0.30 (ng/mL)    Relative Index RELATIVE INDEX IS INVALID  0.0 - 2.5    CBC     Status: Abnormal   Collection Time   08/05/11  4:27 AM      Component Value Range Comment   WBC 7.1  4.0 - 10.5 (K/uL)    RBC 2.78 (*) 3.87 - 5.11 (MIL/uL)    Hemoglobin 8.7 (*) 12.0 - 15.0 (g/dL)    HCT 21.3 (*) 08.6 - 46.0 (%)    MCV 89.9  78.0 - 100.0 (fL)    MCH 31.3  26.0 - 34.0 (pg)    MCHC 34.8  30.0 - 36.0 (g/dL)    RDW 57.8  46.9 - 62.9 (%)    Platelets 181  150 - 400 (K/uL)   BASIC METABOLIC PANEL     Status: Abnormal   Collection Time   08/05/11  4:27 AM      Component Value Range Comment   Sodium 126 (*) 135 - 145 (mEq/L)    Potassium 3.7  3.5 - 5.1 (mEq/L)    Chloride 96  96 - 112 (mEq/L)    CO2 26  19 - 32 (mEq/L)    Glucose, Bld 97  70 - 99 (mg/dL)    BUN 16  6 - 23 (mg/dL) DELTA CHECK NOTED   Creatinine, Ser 0.75  0.50 - 1.10 (mg/dL)    Calcium 8.0 (*) 8.4 - 10.5 (mg/dL)    GFR calc non Af Amer 76 (*) >90 (mL/min)    GFR calc Af Amer 88 (*) >90 (mL/min)   PREPARE RBC (CROSSMATCH)  Status: Normal   Collection Time   08/05/11  9:00 AM      Component Value Range Comment   Order Confirmation ORDER PROCESSED BY BLOOD BANK     CBC     Status: Abnormal   Collection Time   08/05/11 11:52 AM      Component Value Range Comment   WBC 10.3  4.0 - 10.5 (K/uL)    RBC 3.34 (*) 3.87 - 5.11 (MIL/uL)    Hemoglobin 10.4 (*) 12.0 - 15.0 (g/dL)    HCT 16.1 (*) 09.6 - 46.0 (%)    MCV 90.7  78.0 - 100.0 (fL)    MCH 31.1  26.0 - 34.0 (pg)    MCHC 34.3  30.0 - 36.0 (g/dL)    RDW 04.5  40.9 - 81.1 (%)    Platelets 219  150 - 400 (K/uL)     Dg Lumbar Spine 2-3 Views  08/05/2011  *RADIOLOGY REPORT*  Clinical Data: Back pain.  LUMBAR SPINE - 2-3 VIEW  Comparison: None.  Findings: Severe compression fractures are noted through the T10, L1, and L3 vertebral bodies, age indeterminate.  Slight compression fracture is likely present in the superior  endplate of T12.  There is diffuse osteopenia.  Slight anterolisthesis of L4 on L5 likely related to mild facet disease.  Aorta is calcified, non-aneurysmal. SI joints are symmetric and unremarkable.  IMPRESSION: Severe T10, L1 and L3 compression fractures, age indeterminate. Suspect slight compression through the superior endplate of T12.  Original Report Authenticated By: Cyndie Chime, M.D.   Dg Abd 1 View  08/03/2011  *RADIOLOGY REPORT*  Clinical Data: Rule out free abdominal air  ABDOMEN - 1 VIEW  Comparison: Upright view of the chest same day  Findings: There is a nonspecific nonobstructive bowel gas pattern. Moderate stool and gas noted throughout the colon.  No free abdominal air is noted on the upright view of the chest.  IMPRESSION: Nonspecific nonobstructive bowel gas pattern.  Moderate stool and gas noted throughout the colon.  Original Report Authenticated By: Natasha Mead, M.D.    Review of Systems  Constitutional: Negative.   HENT: Negative.   Eyes: Negative.   Respiratory: Negative.   Cardiovascular: Negative.   Gastrointestinal: Positive for blood in stool.  Genitourinary: Negative.   Musculoskeletal: Positive for back pain.  Skin: Negative for itching and rash.  Neurological: Negative.   Endo/Heme/Allergies: Negative.   Psychiatric/Behavioral: Negative.    Blood pressure 141/97, pulse 107, temperature 98.5 F (36.9 C), temperature source Oral, resp. rate 28, height 5\' 4"  (1.626 m), weight 68.9 kg (151 lb 14.4 oz), SpO2 95.00%. Physical Exam  Assessment/Plan: Vertebroplasty of L-1,but must rember that she was on Pradaxia and this was [facility] on Tuesday,Dec.18.  Tinaya Ceballos A 08/05/2011, 6:32 PM

## 2011-08-05 NOTE — Progress Notes (Signed)
Subjective:    Donna Ortega is an 77 year old female with history of coronary artery disease. She has had atrial fibrillation in the past. She was admitted yesterday with acute hypertension, GI bleed, and rapid atrial fibrillation. She also has a fracture of her back.  Upper endoscopy 12/20 revealed a gastric ulcer - not actively bleeding.  Dr. Matthias Hughs has recommended that we hold Pradaxa and ASA for a week.  12/21 - She remains in A-fib with a rapid rate.  She was started on Amiodarone IV.  Her Hb remains marginal and Int. Med will be giving her another unit of PRBC.      Marland Kitchen amiodarone  150 mg Intravenous Once  . fentaNYL  25 mcg Transdermal Q72H  . metoprolol tartrate  25 mg Oral BID  . pantoprazole  40 mg Oral BID AC  . pantoprazole  40 mg Oral BID AC  . sodium chloride  3 mL Intravenous Q12H  . DISCONTD: metoprolol tartrate  12.5 mg Oral BID      . sodium chloride 100 mL/hr at 08/05/11 0059  . amiodarone (CORDARONE) infusion 60 mg/hr (08/04/11 0859)   And  . amiodarone (CORDARONE) infusion 30 mg/hr (08/05/11 0655)  . DISCONTD: sodium chloride 20 mL/hr at 08/03/11 2200  . DISCONTD: diltiazem (CARDIZEM) infusion 2 mg/hr (08/04/11 0400)  . DISCONTD: pantoprozole (PROTONIX) infusion 8 mg/hr (08/04/11 0400)    Objective:  Vital Signs in the last 24 hours: Blood pressure 122/63, pulse 117, temperature 97.9 F (36.6 C), temperature source Oral, resp. rate 22, height 5\' 4"  (1.626 m), weight 151 lb 14.4 oz (68.9 kg), SpO2 97.00%. Temp:  [97.7 F (36.5 C)-99.3 F (37.4 C)] 97.9 F (36.6 C) (12/21 0700) Pulse Rate:  [60-168] 117  (12/21 0700) Resp:  [11-27] 22  (12/21 0700) BP: (91-128)/(47-91) 122/63 mmHg (12/21 0700) SpO2:  [88 %-98 %] 97 % (12/21 0700) Weight:  [151 lb 14.4 oz (68.9 kg)] 151 lb 14.4 oz (68.9 kg) (12/21 0042)  Intake/Output from previous day: 12/20 0701 - 12/21 0700 In: 3666.7 [I.V.:3341.7] Out: 2950 [Urine:2950] Intake/Output from this shift: Total I/O In:  116.7 [I.V.:116.7] Out: 100 [Urine:100]  Physical Exam: The patient is alert and oriented x 3.  The mood and affect are normal.   Skin: warm and dry.    HEENT:   the sclera are nonicteric.  The mucous membranes are moist.  The carotids are 2+ without bruits.  There is no thyromegaly.  There is no JVD.    Lungs: clear anteriorly. She was unable to sit up because of her back pain.   Heart: Irregularly irregular. Her rate is very fast. There is a soft systolic murmur.  Abdomen: good bowel sounds.  There is no guarding or rebound.  There is no hepatosplenomegaly or tenderness.  There are no masses.   Extremities:  no clubbing, cyanosis, or edema.  The legs are without rashes.  The distal pulses are intact.   Neuro:  Cranial nerves II - XII are intact.  Motor and sensory functions are intact.     Lab Results:  [facility] 08/05/11 0427 08/04/11 1233  WBC 7.1 7.2  HGB 8.7* 9.3*  PLT 181 201    Basename 08/05/11 0427 08/03/11 1541  NA 126* 126*  K 3.7 4.7  CL 96 96  CO2 26 --  GLUCOSE 97 104*  BUN 16 35*  CREATININE 0.75 0.80    Basename 08/04/11 1546 08/04/11 0846  TROPONINI <0.30 <0.30    Lab Results  Component  Value Date   CHOL 177 07/15/2011   HDL 96.40 07/15/2011   LDLCALC 74 07/15/2011   TRIG 32.0 07/15/2011   CHOLHDL 2 07/15/2011    Basename 08/03/11 1711  INR 1.67*    Telemetry: Atrial fibrillation/atrial flutter with a rapid ventricular response   Assessment/Plan:   1. Atrial fibrillation: The patient has recurrent atrial fibrillation/atrial flutter. I suspect this is due to the stress of her GI bleed and subsequent hypotension. She has not responded adequately to Cardizem mainly because of her limited due to her hypotension.  The Amiodarone is helping to a minimal degree.  I think she will improved once her Hb is normal .  I agree with Int. Med with the  Increase in  Metoprolol   2. Coronary artery disease: The patient is status post coronary artery  bypass grafting. Her CPK levels are normal. Her troponin levels are only barely elevated at 0.34. This is due to the rapid atrial fibrillation. This is not consistent with an acute coronary syndrome. I do not think that she needs a cardiac catheterization based on these results. If she has any chest pain or higher cardiac enzymes and we certainly will consider further evaluation at this point I do not think that she needs a stress test or cardiac catheterization.  I agree with holding ASA for a week -  She was on ASA 325 as op.  We will decrease dose to 81 mg a day  3. GI bleed: She'll need further followup from GI.  We have stopped her Pradaxa.  Will anticipate restarting in a week.  [name], Montez Hageman., MD, [facility] 08/05/2011, 8:59 AM LOS: Day 2

## 2011-08-05 NOTE — Progress Notes (Addendum)
Subjective: Up in chair today. Main complaint is primarily of the persistent low back pain. Endorsed past one black stool yesterday evening. Denies difficulty with eating or nausea and vomiting. Denies abdominal pain. Would like to have Foley catheter removed.  Objective: Vital signs in last 24 hours: Temp:  [97.9 F (36.6 C)-99.3 F (37.4 C)] 98.2 F (36.8 C) (12/21 0945) Pulse Rate:  [83-168] 123  (12/21 0959) Resp:  [11-27] 16  (12/21 0927) BP: (91-128)/(47-91) 114/88 mmHg (12/21 0959) SpO2:  [88 %-97 %] 97 % (12/21 0700) Weight:  [68.9 kg (151 lb 14.4 oz)] 151 lb 14.4 oz (68.9 kg) (12/21 0042) Weight change: 3.1 kg (6 lb 13.4 oz) Last BM Date: 08/04/11  Intake/Output from previous day: 12/20 0701 - 12/21 0700 In: 3666.7 [I.V.:3341.7] Out: 2950 [Urine:2950] Intake/Output this shift: Total I/O In: 655.9 [I.V.:493.4; Blood:162.5] Out: 100 [Urine:100]  General appearance: alert, cooperative, appears stated age and mild distress Resp: clear to auscultation bilaterally, she is on room air maintaining O2 saturations of 97%, respiratory effort is nontachypneic and unlabored. Cardio: irregularly irregular rhythm and S1, S2 normal, bedside telemetry demonstrates H. or fibrillation with rapid ventricular response rates varying from 140-180 beats per minute, do to persistent hypotension IV Cardizem has been discontinued in the ER and is currently hanging and IV amiodarone infusion. GI: soft, non-tender; bowel sounds normal; no masses,  no organomegaly, continuous Protonix  infusion and play Extremities: extremities normal, atraumatic, no cyanosis or edema Neurologic: Grossly normal  Lab Results:  Basename 08/05/11 0427 08/04/11 1233  WBC 7.1 7.2  HGB 8.7* 9.3*  HCT 25.0* 27.1*  PLT 181 201   BMET  Basename 08/05/11 0427 August 30, 2011 1541  NA 126* 126*  K 3.7 4.7  CL 96 96  CO2 26 --  GLUCOSE 97 104*  BUN 16 35*  CREATININE 0.75 0.80  CALCIUM 8.0* --    Studies/Results: Dg  Abd 1 View  08-30-2011  *RADIOLOGY REPORT*  Clinical Data: Rule out free abdominal air  ABDOMEN - 1 VIEW  Comparison: Upright view of the chest same day  Findings: There is a nonspecific nonobstructive bowel gas pattern. Moderate stool and gas noted throughout the colon.  No free abdominal air is noted on the upright view of the chest.  IMPRESSION: Nonspecific nonobstructive bowel gas pattern.  Moderate stool and gas noted throughout the colon.  Original Report Authenticated By: Natasha Mead, M.D.   Dg Chest Port 1 View  30-Aug-2011  *RADIOLOGY REPORT*  Clinical Data: Chest pain  PORTABLE CHEST - 1 VIEW  Comparison: 10/03/2005  Findings: Cardiomediastinal silhouette is stable.  Status post median sternotomy again noted.  No acute infiltrate or pleural effusion.  No pulmonary edema.  IMPRESSION: No active disease.  Status post median sternotomy again noted.  Original Report Authenticated By: Natasha Mead, M.D.    Medications:  I have reviewed the patient's current medications. Scheduled:    . fentaNYL  25 mcg Transdermal Q72H  . metoprolol tartrate  25 mg Oral BID  . pantoprazole  40 mg Oral BID AC  . sodium chloride  3 mL Intravenous Q12H  . DISCONTD: metoprolol tartrate  12.5 mg Oral BID  . DISCONTD: pantoprazole  40 mg Oral BID AC    Assessment/Plan:  Principal Problem:  *Hypotension Systolic blood pressure much better. Suspect was primarily mediated by acute blood loss anemia and further exacerbated by attempts at rate control with IV Cardizem.  Active Problems:  Melena/ GI bleed   Underwent EGD on 08/04/2011 which  found several clean gastric ulcers that were nonbleeding. She has subsequently been changed from Protonix infusion to oral Protonix. Dr. Matthias Hughs would like to perform a surveillance EGD in several months to follow ulcer healing but otherwise no additional GI workup is indicated at this time  Anemia associated with acute blood loss Baseline hemoglobin is is unknown. Hemoglobin  her presentation is 8.5. She has subsequently been transfused with 2 units of packed red blood cells since admission. Her hemoglobin has drifted slightly downward today to 8.7 after increasing yesterday to 9.3 after transfusion. Due to ongoing tachycardia we'll go ahead and transfuse at least one more unit and check CBC after transfusion and in the morning.   CAD (coronary artery disease) Has been on for Pradexa for over one year but do to current GI bleeding symptoms this medication is on hold. Appreciate Dr. Nahser/cardiology assistance. Initial troponin I in the ER on her point-of-care panel was 0.34 but repeat troponin I with a cardiac panel was normal at less than 0.3. We'll continue to monitor. She denies chest pain or other cardiac ischemic symptoms and her EKG is stable. I have discussed this with Dr. Matthias Hughs as well as Dr. Elease Hashimoto. For the time being we will hold her aspirin and her pre-DAX for at least one week as recommended by gastroenterology. She definitely would benefit from dual therapy given her underlying CAD as well as atrial fibrillation. She was already on the lowest dose aspirin that we would normally utilize which was 81 mg. Will likely restart both medications in the time frame recommended and observe for additional bleeding.   Atrial fibrillation with RVR Her heart rate had improved with the amiodarone at 60 mg per hour and as per standard of care the amiodarone dose was decreased to 30 mg per hour the staff noted more recurrent RVR after this was done. She had already been placed on twice a day Lopressor at a very low dose yesterday. We were limited on 08/04/2011 in regards to rate control medications because of her hypertension. As of today her blood pressure is much improved and we will increase the Lopressor from 12.5-25 mg by mouth twice a day.   Her history regarding the atrial fibrillation has been of a paroxysmal nature and she was not on Coumadin prior to admission. The  cardiologist suspects that current atrial fibrillation exacerbation is due to the combined problems of symptomatic anemia and hypotension. Obviously given her acute GI bleeding she is not a candidate for systemic anticoagulation.   Hyponatremia This was present at admission and is likely secondary to use of thiazide diuretic. We'll continue to monitor electrolyte panel.   Lumbar back pain/subacute compression fracture She was recently evaluated by Dr. Ree Shay physician prior to admission and she had undergone office-based MRI prior to admission. These results are not in the [facility]ology system. I have placed a call to his office today and I spoke with the office staff. They were given my cell phone number and Dr. Darrelyn Hillock is to return my call later today. Patient is still endorsing significant inadequate pain control today. 08/04/2011 we started a fentanyl patch. She also has been utilizing IV morphine and oral Percocet with only minimal decrease in symptoms. When more hemodynamically stable we'll proceed with physical therapy evaluation. Hopefully when the orthopedic surgeon returns my call we will have more information as to the etiology of her symptoms and possibly a more definitive treatment plan such as option of kyphoplasty if compression fractures are definitely  identified.   HTN (hypertension) Previous hypotension was from GI bleeding and anemia. Home medications include hydrochlorothiazide, lisinopril and metoprolol. Her blood pressure has improved so we can gradually resume these medications. Metoprolol was initiated 08/04/2011 to 8 and rate control.    Dyslipidemia She was taking pravastatin preadmission and since her diet has been resumed we will reorder this medication today   Disposition Due to ongoing atrial fibrillation with RVR requiring IV amiodarone as well as potential recurrent GI bleeding patient were made in the step down ICU. To adhere to standard of care regarding  Foley catheters we will remove the catheter today.   LOS: 2 days   Junious Silk, ANP pager [phone]  [facility] [email or web address] Password: [email or web address]  08/05/2011, 10:22 AM   Addendum: 1511-since my initial note Dr. Darrelyn Hillock has returned my phone call. He confirms that the patient had a significant L1 compression fracture seen on previous imaging at their office. He suspects that she needs evaluation by interventional radiology for possible vertebroplasty. He also recommends that she utilize a lumbar support when upright. He states that the patient has a support at home and the family will bring to the bedside. I have spoken to interventional radiology department and they request that a hard copy of the MRI with images be sent to [facility]. I have spoken with Lannie Fields in medical records at Dr. Darrelyn Hillock for his office. She confirms that she will be able to obtain a hard copy of these images and she has been directed to have that CD dropped off at [facility] radiology department with interventional radiologist Dr. Hermenia Fiscal.

## 2011-08-05 NOTE — Interval H&P Note (Cosign Needed)
History and Physical Interval Note:  08/05/2011 4:39 PM  SYANA DEGRAFFENREID is tentatively scheduled for T10/L1 vertebroplasty/kyphoplasty on 12/24.The various methods of treatment have been discussed with the patient and family. After consideration of risks, benefits and other options for treatment, the patient has consented to the above procedure. .  The patients' history/ imaging studies have been reviewed, patient examined, no change in status, stable for the above procedure.  I have reviewed the patients' chart and labs.  Questions were answered to the patient's satisfaction.  Will recheck labs 12/24 am. Recommend holding pradaxa until after above procedure.  Past Medical History  Diagnosis Date  . Coronary artery disease 2007    3 vessel CABG with LIMA-LAD, SVG to diag, and SVG - Ramus  . Hyperlipidemia   . Hypertension   . Atrial fibrillation     paroxysmal  . History of hyperkalemia   . Hyponatremia   . Diverticulitis    Results for orders placed during the hospital encounter of 08/03/11  CBC      Component Value Range   WBC 8.5  4.0 - 10.5 (K/uL)   RBC 2.73 (*) 3.87 - 5.11 (MIL/uL)   Hemoglobin 8.5 (*) 12.0 - 15.0 (g/dL)   HCT 96.0 (*) 45.4 - 46.0 (%)   MCV 90.1  78.0 - 100.0 (fL)   MCH 31.1  26.0 - 34.0 (pg)   MCHC 34.6  30.0 - 36.0 (g/dL)   RDW 09.8  11.9 - 14.7 (%)   Platelets 274  150 - 400 (K/uL)  TROPONIN I      Component Value Range   Troponin I 0.34 (*) <0.30 (ng/mL)  POCT I-STAT, CHEM 8      Component Value Range   Sodium 126 (*) 135 - 145 (mEq/L)   Potassium 4.7  3.5 - 5.1 (mEq/L)   Chloride 96  96 - 112 (mEq/L)   BUN 35 (*) 6 - 23 (mg/dL)   Creatinine, Ser 8.29  0.50 - 1.10 (mg/dL)   Glucose, Bld 562 (*) 70 - 99 (mg/dL)   Calcium, Ion 1.30  8.65 - 1.32 (mmol/L)   TCO2 22  0 - 100 (mmol/L)   Hemoglobin 8.5 (*) 12.0 - 15.0 (g/dL)   HCT 78.4 (*) 69.6 - 46.0 (%)  TYPE AND SCREEN      Component Value Range   ABO/RH(D) O NEG     Antibody Screen NEG     Sample Expiration 08/06/2011     Unit Number [id number]     Blood Component Type RBC LR PHER1     Unit division 00     Status of Unit ISSUED,FINAL     Transfusion Status OK TO TRANSFUSE     Crossmatch Result Compatible     Unit Number [id number]     Blood Component Type RBC LR PHER2     Unit division 00     Status of Unit ISSUED,FINAL     Transfusion Status OK TO TRANSFUSE     Crossmatch Result Compatible     Unit Number [id number]     Blood Component Type RBC CPDA1, LR     Unit division 00     Status of Unit ISSUED     Transfusion Status OK TO TRANSFUSE     Crossmatch Result Compatible    APTT      Component Value Range   aPTT 59 (*) 24 - 37 (seconds)  PROTIME-INR      Component Value Range   Prothrombin Time 20.0 (*)  11.6 - 15.2 (seconds)   INR 1.67 (*) 0.00 - 1.49   PREPARE RBC (CROSSMATCH)      Component Value Range   Order Confirmation ORDER PROCESSED BY BLOOD BANK    CBC      Component Value Range   WBC 6.2  4.0 - 10.5 (K/uL)   RBC 2.81 (*) 3.87 - 5.11 (MIL/uL)   Hemoglobin 8.7 (*) 12.0 - 15.0 (g/dL)   HCT 30.8 (*) 65.7 - 46.0 (%)   MCV 90.7  78.0 - 100.0 (fL)   MCH 31.0  26.0 - 34.0 (pg)   MCHC 34.1  30.0 - 36.0 (g/dL)   RDW 84.6  96.2 - 95.2 (%)   Platelets 222  150 - 400 (K/uL)  PREPARE FRESH FROZEN PLASMA      Component Value Range   Unit Number [id number]     Blood Component Type THAWED PLASMA     Unit division 00     Status of Unit ISSUED,FINAL     Transfusion Status OK TO TRANSFUSE     Unit Number [id number]     Blood Component Type THAWED PLASMA     Unit division 00     Status of Unit ISSUED,FINAL     Transfusion Status OK TO TRANSFUSE    MRSA PCR SCREENING      Component Value Range   MRSA by PCR NEGATIVE  NEGATIVE   CARDIAC PANEL(CRET KIN+CKTOT+MB+TROPI)      Component Value Range   Total CK 75  7 - 177 (U/L)   CK, MB 3.4  0.3 - 4.0 (ng/mL)   Troponin I <0.30  <0.30 (ng/mL)   Relative Index RELATIVE INDEX IS INVALID  0.0 - 2.5   CARDIAC  PANEL(CRET KIN+CKTOT+MB+TROPI)      Component Value Range   Total CK 85  7 - 177 (U/L)   CK, MB 3.4  0.3 - 4.0 (ng/mL)   Troponin I <0.30  <0.30 (ng/mL)   Relative Index RELATIVE INDEX IS INVALID  0.0 - 2.5   CARDIAC PANEL(CRET KIN+CKTOT+MB+TROPI)      Component Value Range   Total CK 77  7 - 177 (U/L)   CK, MB 3.1  0.3 - 4.0 (ng/mL)   Troponin I <0.30  <0.30 (ng/mL)   Relative Index RELATIVE INDEX IS INVALID  0.0 - 2.5   TSH      Component Value Range   TSH 1.421  0.350 - 4.500 (uIU/mL)  CBC      Component Value Range   WBC 7.2  4.0 - 10.5 (K/uL)   RBC 3.03 (*) 3.87 - 5.11 (MIL/uL)   Hemoglobin 9.3 (*) 12.0 - 15.0 (g/dL)   HCT 40.3 (*) 47.4 - 46.0 (%)   MCV 89.4  78.0 - 100.0 (fL)   MCH 30.7  26.0 - 34.0 (pg)   MCHC 34.3  30.0 - 36.0 (g/dL)   RDW 25.9  56.3 - 87.5 (%)   Platelets 201  150 - 400 (K/uL)  CBC      Component Value Range   WBC 7.1  4.0 - 10.5 (K/uL)   RBC 2.78 (*) 3.87 - 5.11 (MIL/uL)   Hemoglobin 8.7 (*) 12.0 - 15.0 (g/dL)   HCT 64.3 (*) 32.9 - 46.0 (%)   MCV 89.9  78.0 - 100.0 (fL)   MCH 31.3  26.0 - 34.0 (pg)   MCHC 34.8  30.0 - 36.0 (g/dL)   RDW 51.8  84.1 - 66.0 (%)   Platelets 181  150 -  400 (K/uL)  BASIC METABOLIC PANEL      Component Value Range   Sodium 126 (*) 135 - 145 (mEq/L)   Potassium 3.7  3.5 - 5.1 (mEq/L)   Chloride 96  96 - 112 (mEq/L)   CO2 26  19 - 32 (mEq/L)   Glucose, Bld 97  70 - 99 (mg/dL)   BUN 16  6 - 23 (mg/dL)   Creatinine, Ser 7.42  0.50 - 1.10 (mg/dL)   Calcium 8.0 (*) 8.4 - 10.5 (mg/dL)   GFR calc non Af Amer 76 (*) >90 (mL/min)   GFR calc Af Amer 88 (*) >90 (mL/min)  PREPARE RBC (CROSSMATCH)      Component Value Range   Order Confirmation ORDER PROCESSED BY BLOOD BANK    CBC      Component Value Range   WBC 10.3  4.0 - 10.5 (K/uL)   RBC 3.34 (*) 3.87 - 5.11 (MIL/uL)   Hemoglobin 10.4 (*) 12.0 - 15.0 (g/dL)   HCT 59.5 (*) 63.8 - 46.0 (%)   MCV 90.7  78.0 - 100.0 (fL)   MCH 31.1  26.0 - 34.0 (pg)   MCHC 34.3  30.0  - 36.0 (g/dL)   RDW 75.6  43.3 - 29.5 (%)   Platelets 219  150 - 400 (K/uL)    Keishia Ground,D KEVIN

## 2011-08-05 NOTE — Progress Notes (Signed)
GASTROENTEROLOGY PROGRESS NOTE  Problem:   Gastric ulcer. Recent melena. Posthemorrhagic anemia. Rapid atrial fibrillation. Recent aspirin and Pradaxa therapy.  Subjective: Feel somewhat stronger today. No recent bowel movements recorded, nor does patient recall any. She has just received a unit of packed cells, which I believe is her third unit since admission.  (It is recalled that the patient's stool by rectal exam yesterday was brown in color, not melenic.)  Objective: Hemoglobin today unchanged from yesterday morning, 8.7. BUN improved at 16. Patient is reclining in a bedside chair, no evident distress. Heart rate is still rapid.  Assessment: The patient appears stable from the GI tract standpoint, specifically, no evidence of further bleeding.  Plan: I will plan to sign off at this time.  Please see my progress note from yesterday for detailed recommendations. Call me if any questions.  Florencia Reasons, M.D. 08/05/2011 12:35 PM

## 2011-08-06 DIAGNOSIS — I2510 Atherosclerotic heart disease of native coronary artery without angina pectoris: Secondary | ICD-10-CM

## 2011-08-06 LAB — BASIC METABOLIC PANEL
CO2: 22 mEq/L (ref 19–32)
Chloride: 94 mEq/L — ABNORMAL LOW (ref 96–112)
Creatinine, Ser: 0.66 mg/dL (ref 0.50–1.10)
GFR calc Af Amer: >90 mL/min (ref >90)
Potassium: 3.5 mEq/L (ref 3.5–5.1)
Sodium: 126 mEq/L — ABNORMAL LOW (ref 135–145)

## 2011-08-06 LAB — CBC
Platelets: 212 10*3/uL (ref 150–400)
RBC: 3.44 MIL/uL — ABNORMAL LOW (ref 3.87–5.11)
RDW: 13.5 % (ref 11.5–15.5)
WBC: 9 10*3/uL (ref 4.0–10.5)

## 2011-08-06 LAB — TYPE AND SCREEN
ABO/RH(D): O NEG
Antibody Screen: NEGATIVE
Unit division: 0

## 2011-08-06 LAB — SODIUM, URINE, RANDOM: Sodium, Ur: 10 mEq/L

## 2011-08-06 MED ORDER — AMIODARONE IV BOLUS ONLY 150 MG/100ML
150.0000 mg | INTRAVENOUS | Status: AC
  Administered 2011-08-06 (×2): 150 mg via INTRAVENOUS
  Filled 2011-08-06 (×2): qty 100

## 2011-08-06 MED ORDER — DIGOXIN 125 MCG PO TABS: 0.1250 mg | ORAL_TABLET | Freq: Every day | ORAL | Status: DC

## 2011-08-06 MED ORDER — DEXTROSE 5 % IV SOLN: 300.0000 mg | Freq: Once | INTRAVENOUS | Status: DC

## 2011-08-06 MED ORDER — METOPROLOL TARTRATE 50 MG PO TABS
50.0000 mg | ORAL_TABLET | Freq: Three times a day (TID) | ORAL | Status: DC
  Administered 2011-08-06 (×2): 50 mg via ORAL
  Filled 2011-08-06 (×6): qty 1

## 2011-08-06 MED ORDER — METOPROLOL TARTRATE 25 MG PO TABS: 25.0000 mg | ORAL_TABLET | Freq: Once | ORAL | Status: DC

## 2011-08-06 MED ORDER — METOPROLOL TARTRATE 50 MG PO TABS: 50.0000 mg | ORAL_TABLET | Freq: Two times a day (BID) | ORAL | Status: DC

## 2011-08-06 MED ORDER — POTASSIUM CHLORIDE CRYS ER 20 MEQ PO TBCR
40.0000 meq | EXTENDED_RELEASE_TABLET | Freq: Once | ORAL | Status: AC
  Administered 2011-08-06: 40 meq via ORAL
  Filled 2011-08-06: qty 2

## 2011-08-06 MED ORDER — DIGOXIN 0.25 MG/ML IJ SOLN: 0.2500 mg | Freq: Four times a day (QID) | INTRAMUSCULAR | Status: DC

## 2011-08-06 NOTE — Progress Notes (Signed)
Pt's HR continues in 150s-168.  Pt asymptomatic.  Amiodarone continues at ordered rate.  IV site clear.  B/P 117/81.  Marvis Repress, NP notified.

## 2011-08-06 NOTE — Progress Notes (Addendum)
Subjective:    Donna Ortega is an 77 year old female with history of coronary artery disease. She has had atrial fibrillation in the past. She was admitted with acute hypertension, GI bleed, and rapid atrial fibrillation. She also has a severe L-1 compression fracture.  Upper endoscopy 12/20 revealed a gastric ulcer - not actively bleeding.  Dr. Matthias Hughs has recommended that we hold Pradaxa and ASA for a week.   She has been started on amio for AF with RVR. Metoprolol also titrated.  She feels a bit better but HR still up. Rangin from 100-140. No CP/palps. Still with back pain. Pending vertebroplasty on Tuesday.      . fentaNYL  25 mcg Transdermal Q72H  . metoprolol tartrate  25 mg Oral BID  . pantoprazole  40 mg Oral BID AC  . simvastatin  20 mg Oral q1800  . sodium chloride  3 mL Intravenous Q12H  . white petrolatum          . sodium chloride 100 mL/hr at 08/05/11 0059  . amiodarone (CORDARONE) infusion 30.06 mg/hr (08/06/11 0700)    Objective:  Vital Signs in the last 24 hours: Blood pressure 117/81, pulse 161, temperature 98.4 F (36.9 C), temperature source Oral, resp. rate 27, height 5\' 4"  (1.626 m), weight 71.6 kg (157 lb 13.6 oz), SpO2 96.00%. Temp:  [97.6 F (36.4 C)-99 F (37.2 C)] 98.4 F (36.9 C) (12/22 0825) Pulse Rate:  [50-161] 161  (12/22 0825) Resp:  [12-28] 27  (12/22 0825) BP: (93-141)/(56-97) 117/81 mmHg (12/22 0825) SpO2:  [93 %-98 %] 96 % (12/22 0825) Weight:  [71.6 kg (157 lb 13.6 oz)] 157 lb 13.6 oz (71.6 kg) (12/22 0033)  Intake/Output from previous day: 12/21 0701 - 12/22 0700 In: 1507.4 [I.V.:1257.4; Blood:250] Out: 1329 [Urine:1325; Stool:4] Intake/Output from this shift:    Physical Exam: The patient is alert and oriented x 3.  The mood and affect are normal.   Skin: warm and dry.    HEENT:   the sclera are nonicteric.  The mucous membranes are moist.  The carotids are 2+ without bruits.  There is no thyromegaly.  There is no JVD.     Lungs: clear anteriorly. She was unable to sit up because of her back pain.   Heart: Irregularly irregular. Her rate is fast. There is a soft systolic murmur.  Abdomen: good bowel sounds.  There is no guarding or rebound.  There is no hepatosplenomegaly or tenderness.  There are no masses.   Extremities:  no clubbing, cyanosis. Tr edema.  The legs are without rashes.  The distal pulses are intact.  Has iv infiltrate in L hand  Neuro:  Cranial nerves II - XII are grossly intact.  Motor and sensory functions are intact.  Affect pleasant   Lab Results:  Basename 08/06/11 0400 08/05/11 1152  WBC 9.0 10.3  HGB 10.7* 10.4*  PLT 212 219    Basename 08/06/11 0400 08/05/11 0427  NA 126* 126*  K 3.5 3.7  CL 94* 96  CO2 22 26  GLUCOSE 107* 97  BUN 15 16  CREATININE 0.66 0.75    Basename 08/04/11 1546 08/04/11 0846  TROPONINI <0.30 <0.30    Lab Results  Component Value Date   CHOL 177 07/15/2011   HDL 96.40 07/15/2011   LDLCALC 74 07/15/2011   TRIG 32.0 07/15/2011   CHOLHDL 2 07/15/2011    Basename 08/03/11 1711  INR 1.67*    Telemetry: Atrial fibrillation/ rates 95-160   Assessment:  1) AF with RVR 2) GIB with ulcer on EGD 3) CAD - stable 4) acute blood loss anemia  5) L-1 compression fracture  Plan:  Will give her 300mg  amio bolus and increase metoprolol to 50 q 8 to help with rate control. Continue to hold Pradaxa. Start asa when possible. SCDs for DVT prophylaxis.   Daniel Bensimhon,MD 10:24 AM

## 2011-08-06 NOTE — Progress Notes (Addendum)
Phone call from Patients daughter in law.  Her HR is still fast.  She is scheduled for kyphoplasty on Monday.  Will try to get HR under control before then.   have increased her Metoprolol to 50 mg BID starting tonight. Dr. Gala Romney has given extra Amio which has further slowed her HR  May need the addition of Dilt.  [name], Montez Hageman., MD, [facility] 08/06/2011, 10:23 AM

## 2011-08-06 NOTE — Progress Notes (Signed)
HPI- 77 y/o female who presented with a complaint of increasing lumbar pain and was found to have a-fib w/ RVR and recent episodes of melena.  She was transfused PRBCs, started on an Amiodorone drip and had and EGD which revealed non-bleeding gastric ulcer and duodenal erosions.  Despite volume resusitation, she continues to have a-fib w/ RVR which Cardiology is managing.  Her back pain is secondary to a new L1 compression fx diagnosed by MRI as outpt. She will need a vertebroplasty once A-fib is better controlled.    Subjective: Up in chair today. Continues to have lumbar pain. HR is in 160's this AM. She does not have and respiratory or cardiac complaints.   Objective: Vital signs in last 24 hours: Temp:  [97.6 F (36.4 C)-99 F (37.2 C)] 98.4 F (36.9 C) (12/22 0825) Pulse Rate:  [50-161] 161  (12/22 0825) Resp:  [12-28] 27  (12/22 0825) BP: (93-141)/(56-97) 117/81 mmHg (12/22 0825) SpO2:  [93 %-98 %] 96 % (12/22 0825) Weight:  [71.6 kg (157 lb 13.6 oz)] 157 lb 13.6 oz (71.6 kg) (12/22 0033) Weight change: 2.7 kg (5 lb 15.2 oz) Last BM Date: 08/04/11  Intake/Output from previous day: 12/21 0701 - 12/22 0700 In: 1507.4 [I.V.:1257.4; Blood:250] Out: 1329 [Urine:1325; Stool:4]   General appearance: alert, cooperative, appears stated age  Resp: clear to auscultation bilaterally Cardio: irregularly irregular rhythm and S1, S2 normal GI: soft, non-tender; bowel sounds normal; no masses,  no organomegaly Extremities: extremities normal, atraumatic, no cyanosis or edema Neurologic: Grossly normal  Lab Results:  Basename 08/06/11 0400 08/05/11 1152  WBC 9.0 10.3  HGB 10.7* 10.4*  HCT 30.6* 30.3*  PLT 212 219   BMET  Basename 08/06/11 0400 08/05/11 0427  NA 126* 126*  K 3.5 3.7  CL 94* 96  CO2 22 26  GLUCOSE 107* 97  BUN 15 16  CREATININE 0.66 0.75  CALCIUM 8.3* 8.0*    Studies/Results: Dg Lumbar Spine 2-3 Views  08/05/2011  *RADIOLOGY REPORT*  Clinical Data: Back  pain.  LUMBAR SPINE - 2-3 VIEW  Comparison: None.  Findings: Severe compression fractures are noted through the T10, L1, and L3 vertebral bodies, age indeterminate.  Slight compression fracture is likely present in the superior endplate of T12.  There is diffuse osteopenia.  Slight anterolisthesis of L4 on L5 likely related to mild facet disease.  Aorta is calcified, non-aneurysmal. SI joints are symmetric and unremarkable.  IMPRESSION: Severe T10, L1 and L3 compression fractures, age indeterminate. Suspect slight compression through the superior endplate of T12.  Original Report Authenticated By: Cyndie Chime, M.D.    Medications:  I have reviewed the patient's current medications. Scheduled:    . fentaNYL  25 mcg Transdermal Q72H  . metoprolol tartrate  25 mg Oral BID  . pantoprazole  40 mg Oral BID AC  . simvastatin  20 mg Oral q1800  . sodium chloride  3 mL Intravenous Q12H  . white petrolatum        Assessment/Plan:    *Hypotension Systolic blood pressure much better. Suspect was primarily mediated by acute blood loss anemia and further exacerbated by attempts at rate control with IV Cardizem.   Melena/ GI bleed   Underwent EGD on 08/04/2011 which found several clean gastric ulcers that were nonbleeding. She has subsequently been changed from Protonix infusion to oral Protonix. Dr. Matthias Hughs would like to perform a surveillance EGD in several months to follow ulcer healing but otherwise no additional GI workup is indicated  at this time  Anemia associated with acute blood loss Baseline hemoglobin is is unknown. Hemoglobin her presentation is 8.5. She has subsequently been transfused with 2 units of packed red blood cells since admission. Her hemoglobin has drifted slightly downward today to 8.7 after increasing yesterday to 9.3 after transfusion. Due to ongoing tachycardia we'll go ahead and transfuse at least one more unit and check CBC after transfusion and in the morning.   CAD  (coronary artery disease) Has been on for Pradexa for over one year but do to current GI bleeding symptoms this medication is on hold. Appreciate Dr. Nahser/cardiology assistance. Initial troponin I in the ER on her point-of-care panel was 0.34 but repeat troponin I with a cardiac panel was normal at less than 0.3. We'll continue to monitor. She denies chest pain or other cardiac ischemic symptoms and her EKG is stable. I have discussed this with Dr. Matthias Hughs as well as Dr. Elease Hashimoto.  For the time being we will hold her aspirin and her pre-DAX for at least one week as recommended by gastroenterology.  She definitely would benefit from dual therapy given her underlying CAD as well as atrial fibrillation. She was already on the lowest dose aspirin that we would normally utilize which was 81 mg. Will likely restart both medications in the time frame recommended and observe for additional bleeding.   Atrial fibrillation with RVR On Amiodorone drip and BID Lopressor but continues to be a rate control issue.   Her history regarding the atrial fibrillation has been of a paroxysmal nature and she was not on Coumadin prior to admission. The cardiologist suspects that current atrial fibrillation exacerbation is due to the combined problems of symptomatic anemia and hypotension. Obviously given her acute GI bleeding she is not a candidate for systemic anticoagulation.   Hyponatremia This was present at admission and is likely secondary to use of thiazide diuretic. I will obtain urine studies today in regards to sodium and osmolality.    Lumbar back pain/subacute compression fracture She was recently evaluated by Dr. Ree Shay physician prior to admission and she had undergone office-based MRI prior to admission.  08/04/2011 we started a fentanyl patch. She also has been utilizing IV morphine and oral Percocet with only minimal decrease in symptoms. When more hemodynamically stable we'll proceed with  vertebroplasty.    HTN (hypertension) Previous hypotension was from GI bleeding and anemia. Home medications include hydrochlorothiazide, lisinopril and metoprolol. Her blood pressure has improved so we can gradually resume these medications. Metoprolol was initiated 08/04/2011 to aid in rate control.    Dyslipidemia She was taking pravastatin preadmission.   Disposition Due to ongoing atrial fibrillation with RVR requiring IV amiodarone as well as potential recurrent GI bleeding patient were made in the step down ICU.    LOS: 3 days

## 2011-08-07 LAB — OSMOLALITY, URINE: Osmolality, Ur: 307 mOsm/kg — ABNORMAL LOW (ref 390–1090)

## 2011-08-07 MED ORDER — CEFAZOLIN SODIUM 1-5 GM-% IV SOLN
1.0000 g | INTRAVENOUS | Status: AC
  Administered 2011-08-08: 1 g via INTRAVENOUS
  Filled 2011-08-07: qty 50

## 2011-08-07 MED ORDER — DILTIAZEM HCL 100 MG IV SOLR
10.0000 mg/h | INTRAVENOUS | Status: DC
  Administered 2011-08-07 (×2): 5 mg/h via INTRAVENOUS
  Administered 2011-08-08: 10 mg/h via INTRAVENOUS
  Filled 2011-08-07 (×6): qty 100

## 2011-08-07 MED ORDER — DILTIAZEM LOAD VIA INFUSION
10.0000 mg | Freq: Once | INTRAVENOUS | Status: AC
  Administered 2011-08-07: 10 mg via INTRAVENOUS
  Filled 2011-08-07: qty 10

## 2011-08-07 MED ORDER — PRAVASTATIN SODIUM 40 MG PO TABS
40.0000 mg | ORAL_TABLET | Freq: Every day | ORAL | Status: DC
  Administered 2011-08-07 – 2011-08-13 (×7): 40 mg via ORAL
  Filled 2011-08-07 (×8): qty 1

## 2011-08-07 MED ORDER — DEXTROSE 5 % IV SOLN
300.0000 mg | Freq: Once | INTRAVENOUS | Status: AC
  Administered 2011-08-07: 300 mg via INTRAVENOUS
  Filled 2011-08-07: qty 6

## 2011-08-07 MED ORDER — FUROSEMIDE 10 MG/ML IJ SOLN
40.0000 mg | Freq: Once | INTRAMUSCULAR | Status: AC
  Administered 2011-08-07: 40 mg via INTRAVENOUS
  Filled 2011-08-07 (×2): qty 4

## 2011-08-07 NOTE — H&P (Signed)
Donna Ortega is an 77 y.o. female.   Chief Complaint: Painful compression fractures HPI: Pleasant 77 yo female with painful compression fractures and plans for T10 and L1 KP/VP tomorrow. Situation complicated by Afib with RVR and occasional hypotension. Consent has already been signed.  Past Medical History  Diagnosis Date  . Coronary artery disease 2007    3 vessel CABG with LIMA-LAD, SVG to diag, and SVG - Ramus  . Hyperlipidemia   . Hypertension   . Atrial fibrillation     paroxysmal  . History of hyperkalemia   . Hyponatremia   . Diverticulitis     Past Surgical History  Procedure Date  . Maze   . Coronary artery bypass graft   . Partial hysterectomy   . Esophagogastroduodenoscopy 08/04/2011    Procedure: ESOPHAGOGASTRODUODENOSCOPY (EGD);  Surgeon: Florencia Reasons, MD;  Location: [facility] ENDOSCOPY;  Service: Endoscopy;  Laterality: N/A;  do at bedside    Family History  Problem Relation Age of Onset  . Heart failure Father   . Kidney failure Father   . Bone cancer Brother   . Kidney failure Sister   . Colon cancer Sister   . Cancer Sister    Social History:  reports that she has never smoked. She does not have any smokeless tobacco history on file. She reports that she does not drink alcohol or use illicit drugs.  Allergies:  Allergies  Allergen Reactions  . Lipitor (Atorvastatin Calcium)     MUSCLE ACHES  . Niaspan (Niacin (Antihyperlipidemic)) Rash    Medications Prior to Admission  Medication Dose Route Frequency Provider Last Rate Last Dose  . 0.9 %  sodium chloride infusion  250 mL Intravenous PRN Nayana Abrol 10 mL/hr at 08/06/11 1800 250 mL at 08/06/11 1800  . 0.9 %  sodium chloride infusion   Intravenous Continuous Allison L. Rennis Harding, NP 100 mL/hr at 08/05/11 0059    . acetaminophen (TYLENOL) tablet 650 mg  650 mg Oral Q6H PRN Nayana Abrol       Or  . acetaminophen (TYLENOL) suppository 650 mg  650 mg Rectal Q6H PRN Nayana Abrol      . albuterol (PROVENTIL)  (5 MG/ML) 0.5% nebulizer solution 2.5 mg  2.5 mg Nebulization Q2H PRN Nayana Abrol      . amiodarone (CORDARONE) 1.8 mg/mL load via infusion 150 mg  150 mg Intravenous Once Elyn Aquas., MD 500 mL/hr at 08/04/11 0903 150 mg at 08/04/11 1610   And  . amiodarone (CORDARONE) 450 mg in dextrose 5 % 250 mL infusion  60 mg/hr Intravenous Continuous Elyn Aquas., MD 33.3 mL/hr at 08/04/11 0859 60 mg/hr at 08/04/11 0859   And  . amiodarone (CORDARONE) 450 mg in dextrose 5 % 250 mL infusion  60 mg/hr Intravenous Continuous Dolores Patty, MD 33.3 mL/hr at 08/07/11 0933 60 mg/hr at 08/07/11 0933  . amiodarone (CORDARONE) 300 mg in dextrose 5 % 100 mL bolus  300 mg Intravenous Once Dolores Patty, MD      . amiodarone (CORDARONE,NEXTERONE) IV bolus only 150 mg/100 mL  150 mg Intravenous Q10 min Dolores Patty, MD   150 mg at 08/06/11 1105  . diltiazem (CARDIZEM) 1 mg/mL load via infusion 10 mg  10 mg Intravenous Once Dolores Patty, MD      . diltiazem (CARDIZEM) 100 mg in dextrose 5 % 100 mL infusion  5 mg/hr Intravenous Once Suzi Roots, MD 5 mL/hr at 08/03/11 1650 5 mg/hr at  08/03/11 1650  . diltiazem (CARDIZEM) 100 mg in dextrose 5 % 100 mL infusion  10 mg/hr Intravenous Titrated Dolores Patty, MD      . diltiazem (CARDIZEM) injection 10 mg  10 mg Intravenous Once Suzi Roots, MD   10 mg at 08/03/11 1545  . diltiazem (CARDIZEM) injection 10 mg  10 mg Intravenous Once Suzi Roots, MD   10 mg at 08/03/11 2018  . fentaNYL (DURAGESIC - dosed mcg/hr) patch 25 mcg  25 mcg Transdermal Q72H Allison L. Rennis Harding, NP   25 mcg at 08/04/11 1449  . furosemide (LASIX) injection 20 mg  20 mg Intravenous Once Nayana Abrol   20 mg at 08/04/11 0554  . furosemide (LASIX) injection 40 mg  40 mg Intravenous Once Calvert Cantor, MD      . morphine 2 MG/ML injection 1 mg  1 mg Intravenous Q3H PRN Maren Reamer, NP   1 mg at 08/06/11 0407  . morphine 2 MG/ML injection 1 mg  1 mg Intravenous Q2H PRN  Allison L. Rennis Harding, NP      . morphine 4 MG/ML injection 4 mg  4 mg Intravenous Once Suzi Roots, MD   4 mg at 08/03/11 1649  . ondansetron (ZOFRAN) injection 4 mg  4 mg Intravenous Once Suzi Roots, MD   4 mg at 08/03/11 1649  . ondansetron (ZOFRAN) tablet 4 mg  4 mg Oral Q6H PRN Nayana Abrol       Or  . ondansetron (ZOFRAN) injection 4 mg  4 mg Intravenous Q6H PRN Nayana Abrol      . oxyCODONE-acetaminophen (PERCOCET) 5-325 MG per tablet 1-2 tablet  1-2 tablet Oral Q4H PRN Allison L. Rennis Harding, NP   1 tablet at 08/06/11 2143  . pantoprazole (PROTONIX) 80 mg in sodium chloride 0.9 % 100 mL IVPB  80 mg Intravenous Once Nayana Abrol   80 mg at 08/03/11 2116  . pantoprazole (PROTONIX) EC tablet 40 mg  40 mg Oral BID AC Calvert Cantor, MD   40 mg at 08/07/11 0653  . potassium chloride SA (K-DUR,KLOR-CON) CR tablet 40 mEq  40 mEq Oral Once Dolores Patty, MD   40 mEq at 08/06/11 1200  . simvastatin (ZOCOR) tablet 20 mg  20 mg Oral q1800 Allison L. Rennis Harding, NP   20 mg at 08/06/11 1735  . sodium chloride 0.9 % injection 3 mL  3 mL Intravenous Q12H Nayana Abrol   3 mL at 08/05/11 1002  . sodium chloride 0.9 % injection 3 mL  3 mL Intravenous PRN Nayana Abrol   3 mL at 08/06/11 2145  . white petrolatum (VASELINE) gel           . zolpidem (AMBIEN) tablet 5 mg  5 mg Oral QHS PRN Calvert Cantor, MD      . DISCONTD: 0.9 %  sodium chloride infusion   Intravenous Continuous Suzi Roots, MD 20 mL/hr at 08/03/11 2200    . DISCONTD: adenosine (ADENOCARD) 12 MG/4ML injection 12 mg  12 mg Intravenous Once Lyanne Co, MD      . DISCONTD: amiodarone (CORDARONE) 300 mg in dextrose 5 % 100 mL bolus  300 mg Intravenous Once Dolores Patty, MD      . DISCONTD: digoxin (LANOXIN) injection 0.25 mg  0.25 mg Intravenous Q6H Elyn Aquas., MD      . DISCONTD: digoxin (LANOXIN) tablet 0.125 mg  0.125 mg Oral Daily Elyn Aquas., MD      .  DISCONTD: diltiazem (CARDIZEM) 100 mg in dextrose 5 % 100 mL infusion   2-15 mg/hr Intravenous Titrated Maren Reamer, NP 2 mL/hr at 08/04/11 0400 2 mg/hr at 08/04/11 0400  . DISCONTD: fentaNYL NICU IV Syringe 50 mcg/mL    PRN Florencia Reasons, MD   25 mcg at 08/04/11 1344  . DISCONTD: metoprolol (LOPRESSOR) tablet 50 mg  50 mg Oral BID Elyn Aquas., MD      . DISCONTD: metoprolol (LOPRESSOR) tablet 50 mg  50 mg Oral TID Dolores Patty, MD   50 mg at 08/06/11 2143  . DISCONTD: metoprolol tartrate (LOPRESSOR) tablet 12.5 mg  12.5 mg Oral BID Nayana Abrol   12.5 mg at 08/04/11 2149  . DISCONTD: metoprolol tartrate (LOPRESSOR) tablet 25 mg  25 mg Oral BID Allison L. Rennis Harding, NP   25 mg at 08/06/11 0917  . DISCONTD: metoprolol tartrate (LOPRESSOR) tablet 25 mg  25 mg Oral Once Dolores Patty, MD      . DISCONTD: midazolam (VERSED) 10 MG/2ML injection    PRN Florencia Reasons, MD   2 mg at 08/04/11 1344  . DISCONTD: oxyCODONE-acetaminophen (PERCOCET) 5-325 MG per tablet 1 tablet  1 tablet Oral Q6H PRN Nayana Abrol   1 tablet at 08/05/11 0301  . DISCONTD: pantoprazole (PROTONIX) 80 mg in sodium chloride 0.9 % 250 mL infusion  8 mg/hr Intravenous Continuous Nayana Abrol 25 mL/hr at 08/04/11 0400 8 mg/hr at 08/04/11 0400  . DISCONTD: pantoprazole (PROTONIX) EC tablet 40 mg  40 mg Oral BID AC Florencia Reasons, MD       Medications Prior to Admission  Medication Sig Dispense Refill  . acetaminophen (TYLENOL) 325 MG tablet Take 650 mg by mouth every 6 (six) hours as needed.        Marland Kitchen aspirin 81 MG tablet Take 81 mg by mouth daily.        . Calcium Carbonate-Vitamin D (CALTRATE 600+D PO) Take by mouth daily.        . dabigatran (PRADAXA) 150 MG CAPS Take 150 mg by mouth every 12 (twelve) hours.        . hydrochlorothiazide (HYDRODIURIL) 25 MG tablet TAKE 1/2 TABLET BY MOUTH EVERY DAY  15 tablet  4  . metoprolol tartrate (LOPRESSOR) 25 MG tablet Take 1 tablet (25 mg total) by mouth 2 (two) times daily.  60 tablet  11  . Multiple Vitamin (MULTIVITAMIN) tablet Take 1 tablet  by mouth daily.        . pravastatin (PRAVACHOL) 40 MG tablet Take 40 mg by mouth daily.          Results for orders placed during the hospital encounter of 08/03/11 (from the past 48 hour(s))  CBC     Status: Abnormal   Collection Time   08/05/11 11:52 AM      Component Value Range Comment   WBC 10.3  4.0 - 10.5 (K/uL)    RBC 3.34 (*) 3.87 - 5.11 (MIL/uL)    Hemoglobin 10.4 (*) 12.0 - 15.0 (g/dL)    HCT 16.1 (*) 09.6 - 46.0 (%)    MCV 90.7  78.0 - 100.0 (fL)    MCH 31.1  26.0 - 34.0 (pg)    MCHC 34.3  30.0 - 36.0 (g/dL)    RDW 04.5  40.9 - 81.1 (%)    Platelets 219  150 - 400 (K/uL)   CBC     Status: Abnormal   Collection Time   08/06/11  4:00  AM      Component Value Range Comment   WBC 9.0  4.0 - 10.5 (K/uL)    RBC 3.44 (*) 3.87 - 5.11 (MIL/uL)    Hemoglobin 10.7 (*) 12.0 - 15.0 (g/dL)    HCT 16.1 (*) 09.6 - 46.0 (%)    MCV 89.0  78.0 - 100.0 (fL)    MCH 31.1  26.0 - 34.0 (pg)    MCHC 35.0  30.0 - 36.0 (g/dL)    RDW 04.5  40.9 - 81.1 (%)    Platelets 212  150 - 400 (K/uL)   BASIC METABOLIC PANEL     Status: Abnormal   Collection Time   08/06/11  4:00 AM      Component Value Range Comment   Sodium 126 (*) 135 - 145 (mEq/L)    Potassium 3.5  3.5 - 5.1 (mEq/L)    Chloride 94 (*) 96 - 112 (mEq/L)    CO2 22  19 - 32 (mEq/L)    Glucose, Bld 107 (*) 70 - 99 (mg/dL)    BUN 15  6 - 23 (mg/dL)    Creatinine, Ser 9.14  0.50 - 1.10 (mg/dL)    Calcium 8.3 (*) 8.4 - 10.5 (mg/dL)    GFR calc non Af Amer 79 (*) >90 (mL/min)    GFR calc Af Amer >90  >90 (mL/min)   OSMOLALITY     Status: Abnormal   Collection Time   08/06/11  8:46 AM      Component Value Range Comment   Osmolality 260 (*) 275 - 300 (mOsm/kg)   SODIUM, URINE, RANDOM     Status: Normal   Collection Time   08/06/11  5:28 PM      Component Value Range Comment   Sodium, Ur 10     OSMOLALITY, URINE     Status: Abnormal   Collection Time   08/06/11  5:28 PM      Component Value Range Comment   Osmolality, Ur 307  (*) 390 - 1090 (mOsm/kg)    Dg Lumbar Spine 2-3 Views  08/05/2011  *RADIOLOGY REPORT*  Clinical Data: Back pain.  LUMBAR SPINE - 2-3 VIEW  Comparison: None.  Findings: Severe compression fractures are noted through the T10, L1, and L3 vertebral bodies, age indeterminate.  Slight compression fracture is likely present in the superior endplate of T12.  There is diffuse osteopenia.  Slight anterolisthesis of L4 on L5 likely related to mild facet disease.  Aorta is calcified, non-aneurysmal. SI joints are symmetric and unremarkable.  IMPRESSION: Severe T10, L1 and L3 compression fractures, age indeterminate. Suspect slight compression through the superior endplate of T12.  Original Report Authenticated By: Cyndie Chime, M.D.    Review of Systems  Constitutional: Negative for fever and chills.  Respiratory: Negative for shortness of breath.   Cardiovascular: Positive for palpitations and leg swelling. Negative for chest pain.  Gastrointestinal: Positive for abdominal pain.  Endo/Heme/Allergies: Does not bruise/bleed easily.    Blood pressure 130/93, pulse 140, temperature 97.8 F (36.6 C), temperature source Axillary, resp. rate 17, height 5\' 4"  (1.626 m), weight 156 lb 8.4 oz (71 kg), SpO2 97.00%. Physical Exam  Airway -2  Heart - Rapid Irreg Irreg Lungs - clear Abd - diffuse tenderness Exts - 2+ edema  Assessment/Plan KP / VP tomorrow for painful compression fractures provided heart rate can be controlled and BP maintained. Pt. will require conscious sedation for procedure. Pre sedation assessment not yet performed based on abnormal HR today 130's -  140's.  Garvey Westcott 08/07/2011, 9:34 AM

## 2011-08-07 NOTE — Progress Notes (Signed)
Subjective:    Donna Ortega is an 77 year old female with history of coronary artery disease and chronic atrial fibrillation. She was admitted with acute hypertension, GI bleed, and rapid atrial fibrillation. She also has a severe L-1 compression fracture.  Upper endoscopy 12/20 revealed a gastric ulcer - not actively bleeding.  Dr. Matthias Hughs has recommended that we hold Pradaxa and ASA for a week.   She has been started on amio for AF with RVR.   I gave her a bolus of amio yesterday and Dr. Elease Hashimoto increased metoprolol. HR down to 100 for a period of time but now back to 130-150. SBP 80s. Feels ok except for back pain. No lightheadedness, palpitations, dyspnea or CP.     Marland Kitchen amiodarone  150 mg Intravenous Q10 min  . fentaNYL  25 mcg Transdermal Q72H  . furosemide  40 mg Intravenous Once  . metoprolol tartrate  50 mg Oral TID  . pantoprazole  40 mg Oral BID AC  . potassium chloride  40 mEq Oral Once  . simvastatin  20 mg Oral q1800  . sodium chloride  3 mL Intravenous Q12H  . DISCONTD: amiodarone  300 mg Intravenous Once  . DISCONTD: digoxin  0.25 mg Intravenous Q6H  . DISCONTD: digoxin  0.125 mg Oral Daily  . DISCONTD: metoprolol tartrate  50 mg Oral BID  . DISCONTD: metoprolol tartrate  25 mg Oral BID  . DISCONTD: metoprolol tartrate  25 mg Oral Once      . sodium chloride 100 mL/hr at 08/05/11 0059  . amiodarone (CORDARONE) infusion 30 mg/hr (08/07/11 0700)    Objective:  Vital Signs in the last 24 hours: Blood pressure 130/93, pulse 140, temperature 97.8 F (36.6 C), temperature source Axillary, resp. rate 17, height 5\' 4"  (1.626 m), weight 71 kg (156 lb 8.4 oz), SpO2 97.00%. Temp:  [97.7 F (36.5 C)-98.7 F (37.1 C)] 97.8 F (36.6 C) (12/23 0851) Pulse Rate:  [98-153] 140  (12/23 0851) Resp:  [16-48] 17  (12/23 0851) BP: (91-130)/(51-93) 130/93 mmHg (12/23 0851) SpO2:  [97 %-100 %] 97 % (12/23 0800) Weight:  [71 kg (156 lb 8.4 oz)] 156 lb 8.4 oz (71 kg) (12/23  0500)  Intake/Output from previous day: 12/22 0701 - 12/23 0700 In: 1363.4 [P.O.:840; I.V.:323.4; IV Piggyback:200] Out: 727 [Urine:725; Stool:2] Intake/Output from this shift: Total I/O In: 386.7 [P.O.:360; I.V.:26.7] Out: -   Physical Exam: The patient is alert and oriented x 3.  The mood and affect are normal.   Skin: warm and dry.    HEENT:   the sclera are nonicteric.  The mucous membranes are moist.  The carotids are 2+ without bruits.  There is no thyromegaly.  There is no JVD.    Lungs: clear anteriorly. She was unable to sit up because of her back pain.   Heart: Irregularly irregular. Her rate is fast. There is a soft systolic murmur.  Abdomen: good bowel sounds.  There is no guarding or rebound.  There is no hepatosplenomegaly or tenderness.  There are no masses.   Extremities:  no clubbing, cyanosis. Tr edema.  The legs are without rashes.  The distal pulses are intact.  Has iv infiltrate in L hand  Neuro:  Cranial nerves II - XII are grossly intact.  Motor and sensory functions are intact.  Affect pleasant   Lab Results:  Basename 08/06/11 0400 08/05/11 1152  WBC 9.0 10.3  HGB 10.7* 10.4*  PLT 212 219    Basename 08/06/11 0400  08/05/11 0427  NA 126* 126*  K 3.5 3.7  CL 94* 96  CO2 22 26  GLUCOSE 107* 97  BUN 15 16  CREATININE 0.66 0.75    Basename 08/04/11 1546  TROPONINI <0.30    Lab Results  Component Value Date   CHOL 177 07/15/2011   HDL 96.40 07/15/2011   LDLCALC 74 07/15/2011   TRIG 32.0 07/15/2011   CHOLHDL 2 07/15/2011   No results found for this basename: INR in the last 72 hours  Telemetry: Atrial fibrillation/ rates 135-150   Assessment:   1) AF with RVR 2) GIB with ulcer on EGD 3) CAD - stable 4) acute blood loss anemia  5) L-1 compression fracture  Plan:  Her VR remains quite fast. Not candidate for DC-CV as she has been of anti-coag.  B-block dosing limited by hypotension. Will give another amio bolus and increase rate  to 64mcg/min. Will switch po lopressor to IV diltiazem to see if this will help [place] control AF better to permit vertebroplasty. Can also consider dig. Resume anti-coagulation when ok from GI standpoint.   Daniel Bensimhon,MD 8:53 AM

## 2011-08-07 NOTE — Progress Notes (Signed)
HPI- 77 y/o female who presented with a complaint of increasing lumbar pain and was found to have a-fib w/ RVR and recent episodes of melena.  She was transfused PRBCs, started on an Amiodorone drip.  An EGD  revealed non-bleeding gastric ulcer and duodenal erosions.  Despite volume resusitation, she continues to have a-fib w/ RVR which Cardiology is managing.  Her back pain is secondary to a new L1 compression fx diagnosed by MRI as outpt. She will need a vertebroplasty once A-fib is better controlled.   Subjective: Up in chair. Continues to have lumbar pain. HR is in 150's this AM. She does not have and respiratory or cardiac complaints.   Objective: Vital signs in last 24 hours: Temp:  [97.7 F (36.5 C)-98.7 F (37.1 C)] 97.7 F (36.5 C) (12/23 0351) Pulse Rate:  [98-161] 127  (12/23 0351) Resp:  [16-48] 27  (12/23 0351) BP: (91-118)/(51-84) 91/75 mmHg (12/23 0351) SpO2:  [96 %-100 %] 100 % (12/23 0351) Weight:  [71 kg (156 lb 8.4 oz)] 156 lb 8.4 oz (71 kg) (12/23 0500) Weight change: -0.6 kg (-1 lb 5.2 oz) Last BM Date: 08/06/11  Intake/Output from previous day: 12/22 0701 - 12/23 0700 In: 1336.7 [P.O.:840; I.V.:296.7; IV Piggyback:200] Out: 727 [Urine:725; Stool:2]   General appearance: alert, cooperative, appears stated age  Resp: mild crackles in LLL Cardio: irregularly irregular rhythm and S1, S2 normal GI: soft, non-tender; bowel sounds normal; no masses,  no organomegaly Extremities: extremities normal, atraumatic, no cyanosis. 2+pitting edema Neurologic: Grossly normal  Lab Results:  Basename 08/06/11 0400 08/05/11 1152  WBC 9.0 10.3  HGB 10.7* 10.4*  HCT 30.6* 30.3*  PLT 212 219   BMET  Basename 08/06/11 0400 08/05/11 0427  NA 126* 126*  K 3.5 3.7  CL 94* 96  CO2 22 26  GLUCOSE 107* 97  BUN 15 16  CREATININE 0.66 0.75  CALCIUM 8.3* 8.0*    Studies/Results: Dg Lumbar Spine 2-3 Views  08/05/2011  *RADIOLOGY REPORT*  Clinical Data: Back pain.   LUMBAR SPINE - 2-3 VIEW  Comparison: None.  Findings: Severe compression fractures are noted through the T10, L1, and L3 vertebral bodies, age indeterminate.  Slight compression fracture is likely present in the superior endplate of T12.  There is diffuse osteopenia.  Slight anterolisthesis of L4 on L5 likely related to mild facet disease.  Aorta is calcified, non-aneurysmal. SI joints are symmetric and unremarkable.  IMPRESSION: Severe T10, L1 and L3 compression fractures, age indeterminate. Suspect slight compression through the superior endplate of T12.  Original Report Authenticated By: Cyndie Chime, M.D.    Medications:  I have reviewed the patient's current medications. Scheduled:    . amiodarone  150 mg Intravenous Q10 min  . fentaNYL  25 mcg Transdermal Q72H  . metoprolol tartrate  50 mg Oral TID  . pantoprazole  40 mg Oral BID AC  . potassium chloride  40 mEq Oral Once  . simvastatin  20 mg Oral q1800  . sodium chloride  3 mL Intravenous Q12H  . DISCONTD: amiodarone  300 mg Intravenous Once  . DISCONTD: digoxin  0.25 mg Intravenous Q6H  . DISCONTD: digoxin  0.125 mg Oral Daily  . DISCONTD: metoprolol tartrate  50 mg Oral BID  . DISCONTD: metoprolol tartrate  25 mg Oral BID  . DISCONTD: metoprolol tartrate  25 mg Oral Once    Assessment/Plan:    *Hypotension Metoprolol increased to 50 TID yesterday, however BP is currently < 100 making it  difficult to administer this medication.    Atrial fibrillation with RVR On Amiodorone drip and TID Lopressor but continues to be a rate control issue.  She was given an extra dose of Amiodarone yesterday by Dr Gala Romney which temporarily improved her heart rate.  We may need to start loading her with Digoxin today as BP is also an issue and other rate controlling medications would be contraindicated.  Her history regarding the atrial fibrillation has been of a paroxysmal nature and she was not on Coumadin prior to admission. Obviously  given her acute GI bleeding she is not a candidate for systemic anticoagulation.   Melena/ GI bleed   Underwent EGD on 08/04/2011 which found several clean gastric ulcers that were nonbleeding. She has subsequently been changed from Protonix infusion to oral Protonix. Dr. Matthias Hughs would like to perform a surveillance EGD in several months to follow ulcer healing but otherwise no additional GI workup is indicated at this time  Anemia associated with acute blood loss Baseline hemoglobin is is unknown. Hemoglobin her presentation is 8.5. She has subsequently been transfused a total of 3 units of packed red blood cells since admission. Hgb has been stable at 10 since the 3rd unit was given on 12/21.    CAD (coronary artery disease) Has been on for Pradexa for over one year but do to current GI bleeding symptoms this medication is on hold. Appreciate Dr. Nahser/cardiology assistance. Initial troponin I in the ER on her point-of-care panel was 0.34 but repeat troponin I with a cardiac panel was normal at less than 0.3. We'll continue to monitor. She denies chest pain or other cardiac ischemic symptoms and her EKG is stable. I have discussed this with Dr. Matthias Hughs as well as Dr. Elease Hashimoto.  For the time being we will hold her aspirin and her Pradaxa for at least one week as recommended by gastroenterology.  She definitely would benefit from dual therapy given her underlying CAD as well as atrial fibrillation. She was already on the lowest dose aspirin that we would normally utilize which was 81 mg. Will likely restart both medications in the time frame recommended and observe for additional bleeding.  Hyponatremia This was present at admission and is likely secondary to use of thiazide diuretic.Urine studies reveal a low urine sodium and osmolality.  We will continue to follow sodium level while we continue to hold HCTZ. Would recommend that she not be discharged home with HCTZ.   Pedal edema- Per ECHO, there in  no significant left heart failure. There is noted increased Pulmonary pressure which may be contributing to some right heart failure. We will give her a dose of Lasix today, place TEDS and recommend that her feet be elevated in the bed.    Lumbar back pain/subacute compression fracture She was recently evaluated by Dr. Ree Shay physician prior to admission and she had undergone office-based MRI prior to admission.  08/04/2011 we started a fentanyl patch. She also has been utilizing IV morphine and oral Percocet with only minimal decrease in symptoms. When more hemodynamically stable we'll proceed with vertebroplasty.    Dyslipidemia She was taking pravastatin preadmission.   Disposition Due to ongoing atrial fibrillation with RVR requiring IV amiodarone as well as potential recurrent GI bleeding patient were made in the step down ICU.    LOS: 4 days

## 2011-08-08 ENCOUNTER — Inpatient Hospital Stay (HOSPITAL_COMMUNITY): Payer: No Typology Code available for payment source

## 2011-08-08 DIAGNOSIS — I4891 Unspecified atrial fibrillation: Secondary | ICD-10-CM

## 2011-08-08 LAB — CBC
HCT: 32.9 % — ABNORMAL LOW (ref 36.0–46.0)
Hemoglobin: 11.2 g/dL — ABNORMAL LOW (ref 12.0–15.0)
MCH: 30.9 pg (ref 26.0–34.0)
MCHC: 34 g/dL (ref 30.0–36.0)
MCV: 90.9 fL (ref 78.0–100.0)
Platelets: 274 K/uL (ref 150–400)
RBC: 3.62 MIL/uL — ABNORMAL LOW (ref 3.87–5.11)
RDW: 13.8 % (ref 11.5–15.5)
WBC: 10.4 K/uL (ref 4.0–10.5)

## 2011-08-08 LAB — BASIC METABOLIC PANEL
Calcium: 8.8 mg/dL (ref 8.4–10.5)
GFR calc Af Amer: >90 mL/min (ref >90)
GFR calc non Af Amer: 79 mL/min — ABNORMAL LOW (ref >90)
Glucose, Bld: 96 mg/dL (ref 70–99)
Potassium: 3.7 mEq/L (ref 3.5–5.1)
Sodium: 125 mEq/L — ABNORMAL LOW (ref 135–145)

## 2011-08-08 LAB — PROTIME-INR
INR: 1.05 (ref 0.00–1.49)
Prothrombin Time: 13.9 seconds (ref 11.6–15.2)

## 2011-08-08 LAB — APTT: aPTT: 31 s (ref 24–37)

## 2011-08-08 MED ORDER — FENTANYL CITRATE 0.05 MG/ML IJ SOLN
INTRAMUSCULAR | Status: AC | PRN
  Administered 2011-08-08 (×4): 25 ug via INTRAVENOUS

## 2011-08-08 MED ORDER — SODIUM CHLORIDE 0.9 % IV SOLN
INTRAVENOUS | Status: AC
  Administered 2011-08-08: 15:00:00 via INTRAVENOUS

## 2011-08-08 MED ORDER — MIDAZOLAM HCL 5 MG/5ML IJ SOLN
INTRAMUSCULAR | Status: AC | PRN
  Administered 2011-08-08 (×4): 1 mg via INTRAVENOUS

## 2011-08-08 MED ORDER — HYDROMORPHONE HCL PF 1 MG/ML IJ SOLN
INTRAMUSCULAR | Status: AC
  Filled 2011-08-08: qty 1

## 2011-08-08 NOTE — Procedures (Signed)
S/P L1 balloon kyphoplasty. No acute complications. Full report to follow.

## 2011-08-08 NOTE — Progress Notes (Signed)
Subjective:  Eager to undergo vertebroplasty for treatment of back pain. She has no chest pain or dyspnea. No palps or other complaints.  Objective:  Vital Signs in the last 24 hours: Temp:  [97.5 F (36.4 C)-98.7 F (37.1 C)] 98.2 F (36.8 C) (12/24 0834) Pulse Rate:  [61-109] 74  (12/24 0900) Resp:  [11-26] 17  (12/24 0900) BP: (94-145)/(49-96) 145/66 mmHg (12/24 0900) SpO2:  [86 %-100 %] 98 % (12/24 0900)  Intake/Output from previous day: 12/23 0701 - 12/24 0700 In: 2131.6 [P.O.:1220; I.V.:911.6] Out: 3625 [Urine:3625]  Physical Exam: Pt is alert and oriented, NAD HEENT: normal Neck: JVP - normal Lungs: CTA bilaterally CV: irregularly irregular without murmur or gallop Abd: soft, NT, Positive BS, no hepatomegaly Ext: trace edema, distal pulses intact and equal Skin: warm/dry no rash   Lab Results:  Basename 08/08/11 0500 08/06/11 0400  WBC 10.4 9.0  HGB 11.2* 10.7*  PLT 274 212    Basename 08/08/11 0500 08/06/11 0400  NA 125* 126*  K 3.7 3.5  CL 90* 94*  CO2 25 22  GLUCOSE 96 107*  BUN 11 15  CREATININE 0.68 0.66   No results found for this basename: TROPONINI:2,CK,MB:2 in the last 72 hours  Tele: Atrial fibrillation with heart rate in 90's  Assessment/Plan:  1. AF with RVR 2. GI bleed, stabilized 3. CAD - stable 4. Acute blood loss anemia 5. L1 Compression Fx  Vertebroplasty today. I would recommend continuing IV amiodarone and IV diltiazem today to help manage heart rate around her procedure. Will try to transition to oral agents tomorrow. No anticoagulation for now. Ultimately will need to be back on anticoagulation when cleared by GI. At that point would resume systemic anticoagulation and would avoid aspirin.  Tonny Bollman, M.D. 08/08/2011, 11:17 AM

## 2011-08-08 NOTE — Progress Notes (Signed)
RUA IV noted to be swollen and painful on morning assessment.  IVF stopped and switched to LFA 22G.  25G in RUA removed, gauze applied and pt instructed to elevate as much as possible.  Will notify MD of infiltration.  IV team called to start new IV site as pt appears to have minimal access.

## 2011-08-08 NOTE — Progress Notes (Signed)
Pt back from IR at 1440.  Drsg to back CDI.  Ice pack applied.  Pt instructed to lay flat for 1 hour then 45 degrees for 2hours.  Pt verbalized understanding.  Right arm noted to be red and swollen.  Dr. Sharon Seller notified.  Arm elevated and cool compress applied will monitor for changes.

## 2011-08-08 NOTE — Progress Notes (Signed)
Pt taken to IR via bed for Kyphoplasty.  Report given to RN in IR.

## 2011-08-08 NOTE — Progress Notes (Signed)
Clinical social worker continuing to follow to assist with pt dc plans. CSW awaiting physical therapy evaluation to assist discharge needs.   Catha Gosselin, Theresia Majors  [phone] .08/08/2011 9:53am

## 2011-08-08 NOTE — Progress Notes (Signed)
Subjective: At the time of my evaluation the patient was resting in her bed.  She reported continued low back pain in the area of her known vertebral fracture.  She denied chest pain fevers chills nausea vomiting or shortness of breath.  Objective: Weight change:   Intake/Output Summary (Last 24 hours) at 08/08/11 1628 Last data filed at 08/08/11 1600  Gross per 24 hour  Intake 1147.1 ml  Output   2075 ml  Net -927.9 ml   Blood pressure 107/56, pulse 79, temperature 98.4 F (36.9 C), temperature source Oral, resp. rate 17, height 5\' 4"  (1.626 m), weight 71 kg (156 lb 8.4 oz), SpO2 95.00%.  Physical Exam: General: No acute respiratory distress Lungs: Clear to auscultation bilaterally without wheezes or crackles Cardiovascular: Irregularly irregular with controlled rate without appreciable murmur Abdomen: Nontender, nondistended, soft, bowel sounds positive, no rebound, no ascites, no appreciable mass Extremities: No significant cyanosis, or clubbing, 1+ edema bilateral lower extremities  Lab Results:  Basename 08/08/11 0500 08/06/11 0400  NA 125* 126*  K 3.7 3.5  CL 90* 94*  CO2 25 22  GLUCOSE 96 107*  BUN 11 15  CREATININE 0.68 0.66  CALCIUM 8.8 8.3*  MG -- --  PHOS -- --    Basename 08/08/11 0500 08/06/11 0400  WBC 10.4 9.0  NEUTROABS -- --  HGB 11.2* 10.7*  HCT 32.9* 30.6*  MCV 90.9 89.0  PLT 274 212   Micro Results: Recent Results (from the past 240 hour(s))  MRSA PCR SCREENING     Status: Normal   Collection Time   08/03/11  8:48 PM      Component Value Range Status Comment   MRSA by PCR NEGATIVE  NEGATIVE  Final    Assessment/Plan:  Hypotension  Essentially resolved-continue to follow blood pressure closely  Atrial fibrillation with RVR  Cardiology is following-not a candidate for systemic anticoagulation at the present time due to GI bleeding  Melena/ GI bleed  Underwent EGD on 08/04/2011 which found several clean gastric ulcers that were  nonbleeding. She has subsequently been changed from Protonix infusion to oral Protonix. Dr. Matthias Hughs would like to perform a surveillance EGD in several months to follow ulcer healing but otherwise no additional GI workup is indicated at this time.  We will need to monitor her hemoglobin closely at such time that anticoagulation is resumed.   Anemia associated with acute blood loss  Baseline hemoglobin is is unknown. Hemoglobin and presentation was 8.5. She has subsequently been transfused a total of 3 units of packed red blood cells since admission. Hgb has been stable at or above 10 since the 3rd unit was given on 12/21.   CAD (coronary artery disease)  Cardiology has suggested that we consider not resuming aspirin but resuming pradaxa alone - we will consider doing so early next week  Hyponatremia  This was present at admission and is likely secondary to use of thiazide diuretic.  Urine studies reveal a low urine sodium and osmolality. We will continue to follow sodium level while we continue to hold HCTZ. Would recommend that she not be discharged home with HCTZ.   Pedal edema- Per ECHO, there in no significant left heart failure. There is noted increased Pulmonary pressure which may be contributing to some right heart failure. We will continue to follow her lower extremity exam.  Per my exam today her edema has improved.  Lumbar back pain/subacute compression fracture  The patient was able to undergo vertebroplasty today-interventional radiology is following  for this issue  Dyslipidemia  She was taking pravastatin preadmission.   Disposition  Due to ongoing atrial fibrillation with RVR requiring IV amiodarone as well as potential recurrent GI bleeding patient will remain in the step down unit another day   LOS: 5 days  08/08/2011, 4:28 PM  Lonia Blood, MD [facility] Office  [phone] Pager [phone]  On-Call/Text Page:      [email or web address]      password  [facility]

## 2011-08-09 ENCOUNTER — Other Ambulatory Visit: Payer: Self-pay

## 2011-08-09 LAB — BASIC METABOLIC PANEL
BUN: 10 mg/dL (ref 6–23)
Calcium: 8.6 mg/dL (ref 8.4–10.5)
GFR calc Af Amer: >90 mL/min (ref >90)
GFR calc non Af Amer: 78 mL/min — ABNORMAL LOW (ref >90)
Glucose, Bld: 103 mg/dL — ABNORMAL HIGH (ref 70–99)
Potassium: 4 mEq/L (ref 3.5–5.1)
Sodium: 126 mEq/L — ABNORMAL LOW (ref 135–145)

## 2011-08-09 LAB — CBC
Hemoglobin: 9.4 g/dL — ABNORMAL LOW (ref 12.0–15.0)
MCH: 31 pg (ref 26.0–34.0)
MCHC: 34.2 g/dL (ref 30.0–36.0)
Platelets: 221 10*3/uL (ref 150–400)

## 2011-08-09 MED ORDER — DILTIAZEM HCL ER COATED BEADS 240 MG PO CP24
240.0000 mg | ORAL_CAPSULE | Freq: Every day | ORAL | Status: DC
  Administered 2011-08-09: 240 mg via ORAL
  Filled 2011-08-09 (×2): qty 1

## 2011-08-09 MED ORDER — AMIODARONE HCL 150 MG/3ML IV SOLN
30.0000 mg/h | INTRAVENOUS | Status: DC
  Filled 2011-08-09: qty 9

## 2011-08-09 MED ORDER — METOPROLOL TARTRATE 25 MG PO TABS
25.0000 mg | ORAL_TABLET | Freq: Two times a day (BID) | ORAL | Status: DC
  Administered 2011-08-09 – 2011-08-10 (×4): 25 mg via ORAL
  Filled 2011-08-09 (×6): qty 1

## 2011-08-09 MED ORDER — AMIODARONE HCL 200 MG PO TABS
200.0000 mg | ORAL_TABLET | Freq: Two times a day (BID) | ORAL | Status: DC
  Administered 2011-08-09 – 2011-08-10 (×4): 200 mg via ORAL
  Filled 2011-08-09 (×6): qty 1

## 2011-08-09 NOTE — Progress Notes (Signed)
Subjective:   Donna Ortega is an 77 yo with a hx of CAD, MAZE procedure admitted 12/10 with an acute GI bleed and hypotension.  She had A-Fib with RVR that has been controlled with IV amiodarone.   She converted to NSR several days ago and is feeling better.  She had a compression fx and had kyphoplasty yesterday.      Marland Kitchen ceFAZolin (ANCEF) IV  1 g Intravenous On Call  . fentaNYL  25 mcg Transdermal Q72H  . pantoprazole  40 mg Oral BID AC  . pravastatin  40 mg Oral q1800  . sodium chloride  3 mL Intravenous Q12H      . sodium chloride Stopped (08/08/11 1700)  . sodium chloride 50 mL/hr at 08/08/11 1508  . amiodarone (CORDARONE) infusion 30 mg/hr (08/08/11 1507)  . diltiazem (CARDIZEM) infusion 10 mg/hr (08/08/11 2331)    Objective:  Vital Signs in the last 24 hours: Blood pressure 129/70, pulse 70, temperature 98.6 F (37 C), temperature source Oral, resp. rate 20, height 5\' 4"  (1.626 m), weight 153 lb (69.4 kg), SpO2 95.00%. Temp:  [97.4 F (36.3 C)-99.2 F (37.3 C)] 98.6 F (37 C) (12/25 0454) Pulse Rate:  [50-109] 70  (12/25 0454) Resp:  [10-23] 20  (12/25 0454) BP: (98-154)/(50-103) 129/70 mmHg (12/25 0454) SpO2:  [95 %-100 %] 95 % (12/25 0454) Weight:  [153 lb (69.4 kg)] 153 lb (69.4 kg) (12/25 0100)  Intake/Output from previous day: 12/24 0701 - 12/25 0700 In: 793.7 [P.O.:240; I.V.:553.7] Out: 1326 [Urine:1325; Stool:1] Intake/Output from this shift:    Physical Exam: The patient is alert and oriented x 3.  The mood and affect are normal.   Skin: warm and dry.  Color is normal.    HEENT:   the sclera are nonicteric.  The mucous membranes are moist.  The carotids are 2+ without bruits.  There is no thyromegaly.  There is no JVD.    Lungs: clear.  The chest wall is non tender.    Heart: regular rate with a normal S1 and S2.  There are no murmurs, gallops, or rubs. The PMI is not displaced.     Abdomen: good bowel sounds.  There is no guarding or rebound.  There is  no hepatosplenomegaly or tenderness.  There are no masses.   Extremities:  no clubbing, cyanosis, or edema.  The legs are without rashes.  The distal pulses are intact.   Neuro:  Cranial nerves II - XII are intact.  Motor and sensory functions are intact.     Lab Results:  Basename 08/09/11 0400 08/08/11 0500  WBC 8.0 10.4  HGB 9.4* 11.2*  PLT 221 274    Basename 08/09/11 0400 08/08/11 0500  NA 126* 125*  K 4.0 3.7  CL 93* 90*  CO2 26 25  GLUCOSE 103* 96  BUN 10 11  CREATININE 0.69 0.68   No results found for this basename: TROPONINI:2,CK,MB:2 in the last 72 hours No results found for this basename: BNP in the last 72 hours Hepatic Function Panel No results found for this basename: PROT,ALBUMIN,AST,ALT,ALKPHOS,BILITOT,BILIDIR,IBILI in the last 72 hours Lab Results  Component Value Date   CHOL 177 07/15/2011   HDL 96.40 07/15/2011   LDLCALC 74 07/15/2011   TRIG 32.0 07/15/2011   CHOLHDL 2 07/15/2011    Basename 08/08/11 0500  INR 1.05    Tele:  NSR, 1st degree AV block  Assessment/Plan:   1.  A-Fib:  She is in NSR at present.  Will change her Amio and Dilt drips to PO later today  2.  CAD: stable  3. GI bleed:  Her Hb is stable  Disposition: Transfer to tele.  Possible DC tomorrow.  [name], Montez Hageman., MD, [facility] 08/09/2011, 7:52 AM LOS: Day 6

## 2011-08-09 NOTE — Progress Notes (Addendum)
HPI- 77 y/o female who presented with a complaint of increasing lumbar pain and was found to have a-fib w/ RVR and recent episodes of melena.  She was transfused PRBCs, started on an Amiodorone drip.  An EGD  revealed non-bleeding gastric ulcer and duodenal erosions.  Despite volume resusitation, she continues to have a-fib w/ RVR which Cardiology is managing.  Her back pain is secondary to a new L1 compression fx diagnosed by MRI as outpt.  She underwent a kyphoplasty on 12/23.  Subjective: Up in chair. Back pain has resolved since her kyphoplasty and she has been noted to have converted to NSR this AM. She is feeling quite well today and is optimistic that she will start ambulating soon.    Objective: Vital signs in last 24 hours: Temp:  [97.4 F (36.3 C)-99.2 F (37.3 C)] 98.6 F (37 C) (12/25 0454) Pulse Rate:  [50-107] 70  (12/25 0454) Resp:  [10-23] 20  (12/25 0454) BP: (101-154)/(50-103) 129/70 mmHg (12/25 0454) SpO2:  [95 %-100 %] 95 % (12/25 0454) Weight:  [69.4 kg (153 lb)] 153 lb (69.4 kg) (12/25 0100) Weight change:  Last BM Date: 08/06/11  Intake/Output from previous day: 12/24 0701 - 12/25 0700 In: 793.7 [P.O.:240; I.V.:553.7] Out: 1326 [Urine:1325; Stool:1]   General appearance: alert, cooperative, appears stated age  Resp: clear to auscultaion Cardio: regular rhythm and S1, S2 normal GI: soft, non-tender; bowel sounds normal; no masses,  no organomegaly Extremities: extremities normal, atraumatic, no cyanosis. Resolved pitting edema.  Neurologic: Grossly normal  Lab Results:  Basename 08/09/11 0400 08/08/11 0500  WBC 8.0 10.4  HGB 9.4* 11.2*  HCT 27.5* 32.9*  PLT 221 274   BMET  Basename 08/09/11 0400 08/08/11 0500  NA 126* 125*  K 4.0 3.7  CL 93* 90*  CO2 26 25  GLUCOSE 103* 96  BUN 10 11  CREATININE 0.69 0.68  CALCIUM 8.6 8.8    Studies/Results: No results found.  Medications:  I have reviewed the patient's current  medications. Scheduled:    . amiodarone  200 mg Oral BID  . ceFAZolin (ANCEF) IV  1 g Intravenous On Call  . diltiazem  240 mg Oral Daily  . fentaNYL  25 mcg Transdermal Q72H  . pantoprazole  40 mg Oral BID AC  . pravastatin  40 mg Oral q1800  . sodium chloride  3 mL Intravenous Q12H    Assessment/Plan:    *Hypotension BP has improved. We will resume Metoprolol once the cardizem infusion is discontinued.    Atrial fibrillation with RVR Has now converted - Amio is being converted to PO today. Dr Melburn Popper recommends that we resume Metoprolol once the Cardizem is off if her BP is able to tolerate it. Her history regarding the atrial fibrillation has been of a paroxysmal nature and she was not on Coumadin prior to admission. Obviously given her acute GI bleeding she is not a candidate for systemic anticoagulation.   Melena/ GI bleed   Underwent EGD on 08/04/2011 which found several clean gastric ulcers that were nonbleeding. She has subsequently been changed from Protonix infusion to oral Protonix. Dr. Matthias Hughs would like to perform a surveillance EGD in several months to follow ulcer healing but otherwise no additional GI workup is indicated at this time  Anemia associated with acute blood loss Baseline hemoglobin is is unknown. Hemoglobin her presentation is 8.5. She has subsequently been transfused a total of 3 units of packed red blood cells since admission. Hgb has been stable  since the 3rd unit was given on 12/21.    CAD (coronary artery disease) Has been on for Pradexa for over one year but do to current GI bleeding symptoms this medication is on hold. Appreciate Dr. Nahser/cardiology assistance. Initial troponin I in the ER on her point-of-care panel was 0.34 but repeat troponin I with a cardiac panel was normal at less than 0.3. We'll continue to monitor. She denies chest pain or other cardiac ischemic symptoms and her EKG is stable. For the time being we will hold her aspirin and  her Pradaxa for at least one week as recommended by gastroenterology.  She definitely would benefit from dual therapy given her underlying CAD as well as atrial fibrillation. She was already on the lowest dose aspirin that we would normally utilize which was 81 mg. Will likely restart both medications in the time frame recommended and observe for additional bleeding.  Hyponatremia This was present at admission and is likely secondary to use of thiazide diuretic.Urine studies reveal a low urine sodium and osmolality.  We will continue to follow sodium level while we continue to hold HCTZ. Would recommend that she not be discharged home with HCTZ.   Pedal edema- Per ECHO, there in no significant left heart failure. There is noted increased Pulmonary pressure which may be contributing to some right heart failure. After being given a dose of Lasix and elevating legs, this has improved.    Lumbar back pain/subacute compression fracture She was recently evaluated by Dr. Ree Shay physician prior to admission and she had undergone office-based MRI prior to admission.  08/04/2011 we started a fentanyl patch. S A kyphoplasty was performed on 12/23 resulting in improvement in pain.    Dyslipidemia She was taking pravastatin preadmission.   Disposition We will transfer her out of step down today.     LOS: 6 days    Calvert Cantor MD [phone]

## 2011-08-09 NOTE — Progress Notes (Signed)
Pt transferred to [id number] and report called to [employer].

## 2011-08-09 NOTE — Progress Notes (Signed)
Patient's rhythm converted from A-fib to NSR this morning at 0500, Obtained EKG to confirm rhythm change, notified MD and was directed to contact cardiology.  Number for cardiology team following patient has been paged, day shift RN updated for further follow up.

## 2011-08-10 ENCOUNTER — Other Ambulatory Visit: Payer: Self-pay

## 2011-08-10 MED ORDER — MAGNESIUM CITRATE PO SOLN
1.0000 | Freq: Once | ORAL | Status: DC
  Filled 2011-08-10: qty 296

## 2011-08-10 MED ORDER — SENNA 8.6 MG PO TABS
1.0000 | ORAL_TABLET | Freq: Every day | ORAL | Status: DC
  Administered 2011-08-10 – 2011-08-11 (×2): 8.6 mg via ORAL
  Filled 2011-08-10 (×5): qty 1

## 2011-08-10 MED ORDER — POLYETHYLENE GLYCOL 3350 17 G PO PACK
17.0000 g | PACK | Freq: Two times a day (BID) | ORAL | Status: DC
  Filled 2011-08-10: qty 1

## 2011-08-10 MED ORDER — DOCUSATE SODIUM 100 MG PO CAPS
100.0000 mg | ORAL_CAPSULE | Freq: Two times a day (BID) | ORAL | Status: DC
  Administered 2011-08-10 – 2011-08-14 (×6): 100 mg via ORAL
  Filled 2011-08-10 (×10): qty 1

## 2011-08-10 MED ORDER — POLYETHYLENE GLYCOL 3350 17 G PO PACK
17.0000 g | PACK | Freq: Every day | ORAL | Status: DC
  Filled 2011-08-10: qty 1

## 2011-08-10 MED ORDER — POLYETHYLENE GLYCOL 3350 17 G PO PACK
17.0000 g | PACK | Freq: Once | ORAL | Status: AC
  Administered 2011-08-10: 17 g via ORAL
  Filled 2011-08-10: qty 1

## 2011-08-10 MED FILL — Tobramycin Sulfate For Inj 1.2 GM: INTRAMUSCULAR | Qty: 1 | Status: AC

## 2011-08-10 NOTE — Progress Notes (Signed)
HPI-  77 y/o female who presented with a complaint of increasing lumbar pain and was found to have a-fib w/ RVR and a recent episodes of melena. She was transfused PRBCs and started on an Amiodorone drip. An EGD revealed non-bleeding gastric ulcer and duodenal erosions.  Despite volume resusitation, she continues to have a-fib w/ RVR which Cardiology is managing.  Her back pain is secondary to a new L1 compression fx diagnosed by MRI as outpt.  She underwent a kyphoplasty on 12/23.  Subjective: Interviewed and examined while seated on the side of the bed. She endorses marked improvement in back pain since kyphoplasty procedure. Denies shortness of breath or chest pain. Would like to begin walking. Explained lives with her son but is alone for most of the day. Verbalize concerns over discharge plan. Also endorses issues with constipation and no bowel movement since admission.   Objective: Blood pressure 132/69, pulse 106, temperature 97.7 F (36.5 C), temperature source Oral, resp. rate 18, height 5\' 4"  (1.626 m), weight 70.3 kg (154 lb 15.7 oz), SpO2 95.00%.  General appearance: alert, cooperative, appears stated age  Resp: clear to auscultaion Cardio: regular rhythm and S1, S2 normal GI: soft, non-tender; bowel sounds normal; no masses,  no organomegaly Extremities: extremities normal, atraumatic, no cyanosis. Resolved pitting edema.  Neurologic: Grossly normal  Lab Results:  Basename 08/09/11 0400 08/08/11 0500  WBC 8.0 10.4  HGB 9.4* 11.2*  HCT 27.5* 32.9*  PLT 221 274   BMET  Basename 08/09/11 0400 08/08/11 0500  NA 126* 125*  K 4.0 3.7  CL 93* 90*  CO2 26 25  GLUCOSE 103* 96  BUN 10 11  CREATININE 0.69 0.68  CALCIUM 8.6 8.8   Assessment/Plan:  Hypotension RESOLVED-likely etiology related to acute blood loss anemia and rapid ventricular response in the setting of usual antihypertensive medications.  Atrial fibrillation with RVR Now in normal sinus rhythm. Cardiology  has transitioned to oral amiodarone and today's dose is 200 mg by mouth twice a day. Due to issues with intermittent first degree block cardiology has discontinued her Cardizem.  The patient was on Pradaxa prior to her admission and our plan is to resume this prior to her discharge.  Melena/ GI bleed   Underwent EGD on 08/04/2011 which found several clean gastric ulcers that were nonbleeding. She has subsequently been changed from Protonix infusion to oral Protonix. Dr. Matthias Hughs would like to perform a surveillance EGD in several months to follow ulcer healing but otherwise no additional GI workup is indicated at this time.  We will avoid resuming her low-dose aspirin secondary to this issue.  Anemia associated with acute blood loss Baseline hemoglobin is is unknown. Hemoglobin at her presentation was 8.5. She has subsequently been transfused a total of 3 units of packed red blood cells since admission. Hgb has been stable since the 3rd unit was given on 12/21-but today's hemoglobin suggested a possible 2 g drop-we will recheck in a.m.-I suspect this is simple lab variance as there is no evidence of ongoing bleeding at the present time  CAD (coronary artery disease) Has been on Pradexa for over one year but due to current GI bleeding symptoms this medication is on hold. Appreciate Dr. Nahser/cardiology assistance. Initial troponin I in the ER on her point-of-care panel was 0.34 but repeat troponin I with a cardiac panel was normal at less than 0.3. She denies chest pain or other cardiac ischemic symptoms and her EKG is stable. Pradaxa is to be resumed prior  to her discharge as long as her hemoglobin remains stable.  We will not resume her aspirin.  Constipation Will add Colace and MiraLAX.  Hyponatremia This was present at admission and is likely secondary to use of thiazide diuretic.Urine studies reveal a low urine sodium and osmolality.  We will continue to follow sodium level while we continue to  hold HCTZ. Would recommend that she not be discharged home with HCTZ.   Pedal edema- Per ECHO, there in no significant left heart failure. There is noted increased Pulmonary pressure which may be contributing to some right heart failure. After being given a dose of Lasix and elevating legs, this has improved.   Lumbar back pain/subacute compression fracture She was recently evaluated by Dr. Ree Shay physician prior to admission and she had undergone office-based MRI prior to admission.  A kyphoplasty was performed on 12/23 resulting in improvement in pain.  Will obtain PT and OT consultations today. Patient may benefit from a stay at [facility]  Dyslipidemia She was taking pravastatin preadmission.  Disposition Awaiting PT/OT evaluations and stability in hemoglobin   LOS: 7 days  Junious Silk, ANP [phone]  I have personally examined this patient and reviewed the entire database. I have reviewed the above note, made any necessary editorial changes, and agree with its content.  Lonia Blood, MD [facility]

## 2011-08-10 NOTE — Progress Notes (Signed)
6 Days Post-Op  Subjective: Back pain has improved significantly although patient still fairly sedentary.   Objective: Vital signs in last 24 hours: Temp:  [98 F (36.7 C)-98.9 F (37.2 C)] 98 F (36.7 C) (12/26 0423) Pulse Rate:  [66-135] 68  (12/26 0423) Resp:  [12-29] 12  (12/26 0423) BP: (121-143)/(53-88) 135/71 mmHg (12/26 0423) SpO2:  [95 %-98 %] 95 % (12/26 0423) Weight:  [154 lb 15.7 oz (70.3 kg)] 154 lb 15.7 oz (70.3 kg) (12/26 0423) Last BM Date: 08/07/11  Intake/Output from previous day: 12/25 0701 - 12/26 0700 In: 683.7 [P.O.:120; I.V.:563.7] Out: 300 [Urine:300] Intake/Output this shift: Total I/O In: 360 [P.O.:360] Out: -   Exam: Dressing removed from back. Small amount of dried blood on dressing. Site looks good. No erythema or drainage. No pain to palpation.  Lab Results:   Basename 08/09/11 0400 08/08/11 0500  WBC 8.0 10.4  HGB 9.4* 11.2*  HCT 27.5* 32.9*  PLT 221 274   BMET  Basename 08/09/11 0400 08/08/11 0500  NA 126* 125*  K 4.0 3.7  CL 93* 90*  CO2 26 25  GLUCOSE 103* 96  BUN 10 11  CREATININE 0.69 0.68  CALCIUM 8.6 8.8   PT/INR  Basename 08/08/11 0500  LABPROT 13.9  INR 1.05   ABG No results found for this basename: PHART:2,PCO2:2,PO2:2,HCO3:2 in the last 72 hours  Studies/Results: Ir Vertebroplasty Or Sacroplasty  08/08/2011  *RADIOLOGY REPORT*  Clinical Data:  Severe low back pain secondary to compression fracture at L1.  KYPHOPLASTY AT L1  Comparison:  MRI scan of the lumbosacral spine of a few days ago from outside institution.  Following a full explanation of the procedure along with the potential associated complications, an informed witnessed consent was obtained.  The patient was placed prone on the fluoroscopic table.  The skin overlying the lumbar region was then prepped and draped in the usual sterile fashion.  The right pedicle at L1 was then infiltrated with 0.25% bupivacaine, followed by the advancement of an 11-gauge  Jamshidi needle into the posterior third at L1.  This was then exchanged out for the Kyphon advanced osteo introducer system comprised of a working cannula and a Kyphon osteo drill over a Kyphon osteo bone pin.  This combination was then advanced until the tip of the Kyphon osteo drill was in the posterior third.  At this time the bone pin was removed.  In a medial trajectory, the combination was advanced until the tip of the working cannula was in the posterior third at L1.  Crossing of the midline by the osteo drill was seen.  The osteo drill was then removed and a core sample sent for pathologic analysis.  Through the working cannula, the Kyphon bone biopsy device was then advanced within 5 mm of the anterior aspect of L1.  A core sample from this was also sent for pathologic analysis.  Through the working cannula, a Kyphon inflatable bone tamp 20 x 3 was advanced and positioned with the distal markers 7 mm from the anterior aspect of L1.  This was then expanded with contrast using the Kyphon inflation syringe device via microtubing until there was apposition with the superior and the inferior endplates.  At this time methylmethacrylate mixture was reconstituted with Tobramycin in the Kyphon bone mixing system.  This was then loaded onto Kyphon bone fillers.  The balloon was deflated and removed followed by the instillation of 3.5 bone filler equivalents of methylmethacrylate mixture at L1 with excellent  filling in the AP and lateral projections.  There was no extrusion noted into disc spaces or posteriorly into the spinal canal.  No paraspinous venous examination was seen.  The working cannula and the bone filler were then removed. Hemostasis was achieved at the skin entry site.  The patient tolerated the procedure well.  There were no acute complications.  Medications Utilized:  Versed 4 mg IV.  Fentanyl 100 mcg IV.  Ancef 1 gram IV.  IMPRESSION: 1. Status post fluoroscopic-guided needle placement for deep core  bone biopsy, L1. 2. Status post vertebral body augmentation for painful osteoporotic compression fracture at L1 using the balloon kyphoplasty technique.  Original Report Authenticated By: Oneal Grout, M.D.    Anti-infectives: Anti-infectives     Start     Dose/Rate Route Frequency Ordered Stop   08/08/11 1030   ceFAZolin (ANCEF) IVPB 1 g/50 mL premix     Comments: GIVE ON CALL TO XRAY 12/24 ( FOR VERTEBROPLASTY 12/24)      1 g 100 mL/hr over 30 Minutes Intravenous On call 08/07/11 1934 08/08/11 1406          Assessment/Plan: Procedure(s): S/P L1 KP 08/08/11 Dr. Corliss Skains. Patient doing well.  Transparent dressing removed. Ok to replace with a band aid. Follow up Dr. Corliss Skains in 2 weeks. Office will call patient. Call [phone] if problems.    LOS: 7 days    Avamae Dehaan 08/10/2011

## 2011-08-10 NOTE — Progress Notes (Signed)
Subjective:   Donna Ortega is an 77 yo with a hx of CAD, MAZE procedure admitted 07/25/11 with an acute GI bleed and hypotension.  She had A-Fib with RVR that has been controlled with IV amiodarone.   She converted to NSR several days ago and is feeling better.  She had a compression fx and had kyphoplasty Monday.  She continues to have intermittant A-Fib.  She also has sinus rhythm with a 1st AV block.  She is doing well from a cardiac standpoint.      Marland Kitchen amiodarone  200 mg Oral BID  . diltiazem  240 mg Oral Daily  . fentaNYL  25 mcg Transdermal Q72H  . metoprolol tartrate  25 mg Oral BID  . pantoprazole  40 mg Oral BID AC  . pravastatin  40 mg Oral q1800  . sodium chloride  3 mL Intravenous Q12H      . DISCONTD: sodium chloride Stopped (08/08/11 1700)  . DISCONTD: amiodarone (CORDARONE) infusion 30 mg/hr (08/09/11 0926)  . DISCONTD: amiodarone (CORDARONE) infusion    . DISCONTD: diltiazem (CARDIZEM) infusion 10 mg/hr (08/08/11 2331)    Objective:  Vital Signs in the last 24 hours: Blood pressure 135/71, pulse 68, temperature 98 F (36.7 C), temperature source Oral, resp. rate 12, height 5\' 4"  (1.626 m), weight 154 lb 15.7 oz (70.3 kg), SpO2 95.00%. Temp:  [98 F (36.7 C)-98.9 F (37.2 C)] 98 F (36.7 C) (12/26 0423) Pulse Rate:  [66-135] 68  (12/26 0423) Resp:  [12-29] 12  (12/26 0423) BP: (121-143)/(53-88) 135/71 mmHg (12/26 0423) SpO2:  [95 %-98 %] 95 % (12/26 0423) Weight:  [154 lb 15.7 oz (70.3 kg)] 154 lb 15.7 oz (70.3 kg) (12/26 0423)  Intake/Output from previous day: 12/25 0701 - 12/26 0700 In: 683.7 [P.O.:120; I.V.:563.7] Out: 300 [Urine:300] Intake/Output from this shift: Total I/O In: 360 [P.O.:360] Out: -   Physical Exam: The patient is alert and oriented x 3.  The mood and affect are normal.   Skin: warm and dry.  Color is normal.    HEENT:   the sclera are nonicteric.  The mucous membranes are moist.  The carotids are 2+ without bruits.  There is no  thyromegaly.  There is no JVD.    Lungs: clear.  The chest wall is non tender.    Heart: Irreg. irreg.    Abdomen: good bowel sounds.  There is no guarding or rebound.  There is no hepatosplenomegaly or tenderness.  There are no masses.   Extremities:  no clubbing, cyanosis, or edema.  The legs are without rashes.  The distal pulses are intact.   Neuro:  Cranial nerves II - XII are intact.  Motor and sensory functions are intact.     Lab Results:  Basename 08/09/11 0400 08/08/11 0500  WBC 8.0 10.4  HGB 9.4* 11.2*  PLT 221 274    Basename 08/09/11 0400 08/08/11 0500  NA 126* 125*  K 4.0 3.7  CL 93* 90*  CO2 26 25  GLUCOSE 103* 96  BUN 10 11  CREATININE 0.69 0.68   No results found for this basename: TROPONINI:2,CK,MB:2 in the last 72 hours No results found for this basename: BNP in the last 72 hours Hepatic Function Panel No results found for this basename: PROT,ALBUMIN,AST,ALT,ALKPHOS,BILITOT,BILIDIR,IBILI in the last 72 hours Lab Results  Component Value Date   CHOL 177 07/15/2011   HDL 96.40 07/15/2011   LDLCALC 74 07/15/2011   TRIG 32.0 07/15/2011   CHOLHDL 2  07/15/2011    Basename 08/08/11 0500  INR 1.05    Tele:  NSR, 1st degree AV block  Assessment/Plan:   1.  A-Fib:  She is in NSR at present.   Her tele strips suggests that she has had some PAF.  Will get a 12 lead ECG..  Has a 1st degree AV block.  Will DC Diltiazem and continue Metoprolol  2.  CAD: stable  3. GI bleed:  Her Hb is stable.  Restart Pradaxa. Hold ASA  Disposition: Agree with PT/OT consult.  Donna Ortega, Donna Ortega., MD, [facility] 08/10/2011, 8:59 AM LOS: Day 7

## 2011-08-10 NOTE — Progress Notes (Deleted)
Clinical social worker spoke with pt Assisted living facility. Per Resident care coordinator a nurse assessment will need to be completed closer to patient's anticipated discharge date. CSW to follow up with facility after discussing further with pt MD.   .Catha Gosselin, LCSWA  [phone] .08/10/2011 13:05pm

## 2011-08-10 NOTE — Progress Notes (Signed)
Clinical social worker continuing to follow to assist with pt dc plans. CSW awaiting physical therapy evaluation. CSW also awaiting access to [facility] to complete process for a skilled nursing facility search, unfortunately the program is currently down.   Donna Ortega, Donna Ortega  [phone] .08/10/2011 13:11pm

## 2011-08-11 ENCOUNTER — Other Ambulatory Visit: Payer: Self-pay

## 2011-08-11 LAB — CBC
HCT: 30.9 % — ABNORMAL LOW (ref 36.0–46.0)
Hemoglobin: 10.6 g/dL — ABNORMAL LOW (ref 12.0–15.0)
MCH: 30.7 pg (ref 26.0–34.0)
MCHC: 34.3 g/dL (ref 30.0–36.0)
MCV: 89.6 fL (ref 78.0–100.0)
Platelets: 249 K/uL (ref 150–400)
RBC: 3.45 MIL/uL — ABNORMAL LOW (ref 3.87–5.11)
RDW: 13.2 % (ref 11.5–15.5)
WBC: 9.5 K/uL (ref 4.0–10.5)

## 2011-08-11 LAB — BASIC METABOLIC PANEL
BUN: 12 mg/dL (ref 6–23)
CO2: 26 mEq/L (ref 19–32)
Glucose, Bld: 93 mg/dL (ref 70–99)
Potassium: 4.2 mEq/L (ref 3.5–5.1)
Sodium: 124 mEq/L — ABNORMAL LOW (ref 135–145)

## 2011-08-11 MED ORDER — POLYETHYLENE GLYCOL 3350 17 G PO PACK
17.0000 g | PACK | Freq: Every day | ORAL | Status: DC | PRN
  Filled 2011-08-11: qty 1

## 2011-08-11 MED ORDER — DABIGATRAN ETEXILATE MESYLATE 150 MG PO CAPS
150.0000 mg | ORAL_CAPSULE | Freq: Two times a day (BID) | ORAL | Status: DC
  Administered 2011-08-11 – 2011-08-14 (×7): 150 mg via ORAL
  Filled 2011-08-11 (×8): qty 1

## 2011-08-11 MED ORDER — METOPROLOL TARTRATE 1 MG/ML IV SOLN
2.5000 mg | INTRAVENOUS | Status: AC
  Administered 2011-08-11: 2.5 mg via INTRAVENOUS
  Filled 2011-08-11: qty 5

## 2011-08-11 MED ORDER — MAGNESIUM CITRATE PO SOLN
1.0000 | Freq: Once | ORAL | Status: AC
  Administered 2011-08-11: 1 via ORAL
  Filled 2011-08-11: qty 296

## 2011-08-11 MED ORDER — SENNOSIDES-DOCUSATE SODIUM 8.6-50 MG PO TABS
2.0000 | ORAL_TABLET | Freq: Every day | ORAL | Status: DC
  Administered 2011-08-13: 2 via ORAL
  Filled 2011-08-11 (×4): qty 2

## 2011-08-11 MED ORDER — AMIODARONE HCL 200 MG PO TABS
400.0000 mg | ORAL_TABLET | Freq: Two times a day (BID) | ORAL | Status: DC
  Administered 2011-08-11 – 2011-08-14 (×7): 400 mg via ORAL
  Filled 2011-08-11 (×8): qty 2

## 2011-08-11 MED ORDER — DILTIAZEM HCL ER COATED BEADS 180 MG PO CP24
180.0000 mg | ORAL_CAPSULE | Freq: Every day | ORAL | Status: DC
  Administered 2011-08-11 – 2011-08-12 (×2): 180 mg via ORAL
  Filled 2011-08-11 (×2): qty 1

## 2011-08-11 MED ORDER — METOPROLOL TARTRATE 50 MG PO TABS
50.0000 mg | ORAL_TABLET | Freq: Two times a day (BID) | ORAL | Status: DC
  Administered 2011-08-11 – 2011-08-14 (×7): 50 mg via ORAL
  Filled 2011-08-11 (×8): qty 1

## 2011-08-11 NOTE — Progress Notes (Signed)
Occupational Therapy Evaluation Patient Details Name: Donna Ortega MRN: [id number] DOB: November 10, 1925 Today's Date: 08/11/2011  Problem List:  Patient Active Problem List  Diagnoses  . CAD (coronary artery disease)  . Atrial fibrillation with RVR  . HTN (hypertension)  . Melena  . Hypotension  . Anemia associated with acute blood loss  . Hyponatremia  . Lumbar back pain  . GI bleed  . Dyslipidemia  . Chronic kidney disease (CKD), stage III (moderate)    Past Medical History:  Past Medical History  Diagnosis Date  . Coronary artery disease 2007    3 vessel CABG with LIMA-LAD, SVG to diag, and SVG - Ramus  . Hyperlipidemia   . Hypertension   . Atrial fibrillation     paroxysmal  . History of hyperkalemia   . Hyponatremia   . Diverticulitis    Past Surgical History:  Past Surgical History  Procedure Date  . Maze   . Coronary artery bypass graft   . Partial hysterectomy   . Esophagogastroduodenoscopy 08/04/2011    Procedure: ESOPHAGOGASTRODUODENOSCOPY (EGD);  Surgeon: Florencia Reasons, MD;  Location: [name] ENDOSCOPY;  Service: Endoscopy;  Laterality: N/A;  do at bedside    OT Assessment/Plan/Recommendation OT Assessment Clinical Impression Statement: Pt. will benefit from skilled OT to increase functional independence with ADLs  OT Recommendation/Assessment: Patient will need skilled OT in the acute care venue OT Problem List: Decreased activity tolerance;Decreased safety awareness;Decreased strength Barriers to Discharge: None OT Therapy Diagnosis : Generalized weakness OT Plan OT Frequency: Min 2X/week OT Treatment/Interventions: Self-care/ADL training;Patient/family education OT Recommendation Follow Up Recommendations: None Equipment Recommended: None recommended by OT Individuals Consulted Consulted and Agree with Results and Recommendations: Patient OT Goals Acute Rehab OT Goals OT Goal Formulation: With patient Time For Goal Achievement: 7 days ADL  Goals Pt Will Perform Grooming: with modified independence;Standing at sink ADL Goal: Grooming - Progress: Progressing toward goals Pt Will Perform Lower Body Bathing: with modified independence;Sit to stand from bed ADL Goal: Lower Body Bathing - Progress: Progressing toward goals Pt Will Perform Lower Body Dressing: with modified independence;Sit to stand from bed ADL Goal: Lower Body Dressing - Progress: Progressing toward goals Pt Will Transfer to Toilet: with modified independence;Regular height toilet ADL Goal: Toilet Transfer - Progress: Progressing toward goals Pt Will Perform Toileting - Hygiene: with modified independence;Sit to stand from 3-in-1/toilet ADL Goal: Toileting - Hygiene - Progress: Progressing toward goals  OT Evaluation Precautions/Restrictions  Precautions Precautions: Fall Precaution Comments: pt reports MD told her to wear corset when OOB walking Required Braces or Orthoses: Yes Spinal Brace: Lumbar corset Restrictions Weight Bearing Restrictions: No Prior Functioning Home Living Lives With: [name] Help From: Family Type of Home: House Home Layout: Two level Alternate Level Stairs-Rails: Right Alternate Level Stairs-Number of Steps: 12 Home Access: Stairs to enter Entrance Stairs-Rails: None Entrance Stairs-Number of Steps: 2 Home Adaptive Equipment: None Prior Function Level of Independence: Independent with basic ADLs;Independent with transfers;Independent with homemaking with ambulation Able to Take Stairs?: Yes Driving: No Vocation: Retired ADL ADL Eating/Feeding: Performed;Independent Where Assessed - Eating/Feeding: Chair Grooming: Performed;Wash/dry hands;Supervision/safety;Set up Where Assessed - Grooming: Standing at sink Upper Body Bathing: Simulated;Chest;Right arm;Left arm;Abdomen;Set up Where Assessed - Upper Body Bathing: Sitting, chair Lower Body Bathing: Simulated;Set up Where Assessed - Lower Body Bathing: Sit to stand  from bed Upper Body Dressing: Performed;Set up Upper Body Dressing Details (indicate cue type and reason): With donning gown Where Assessed - Upper Body Dressing: Sitting, chair Lower Body Dressing:  Simulated;Minimal assistance;Supervision/safety Lower Body Dressing Details (indicate cue type and reason): Assist to pull sock up all the way Where Assessed - Lower Body Dressing: Sit to stand from chair Toilet Transfer: Supervision/safety;Performed Toilet Transfer Details (indicate cue type and reason): Min verbal cues for use of grab bar Toilet Transfer Method: Proofreader: Regular height toilet;Grab bars Toileting - Clothing Manipulation: Performed;Set up Where Assessed - Toileting Clothing Manipulation: Standing Toileting - Hygiene: Performed;Set up Where Assessed - Toileting Hygiene: Standing Tub/Shower Transfer: Not assessed Tub/Shower Transfer Method: Not assessed ADL Comments: Pt. with HR in 60's while sitting and increased to 120's after ambulating to bathroom ~ 10' with close supervision. and pt. return to sitting in chair due to MD limiting activity for HR under 120's Vision/Perception  Vision - History Baseline Vision: No visual deficits Patient Visual Report: No change from baseline Vision - Assessment Eye Alignment: Within Functional Limits Vision Assessment: Vision not tested Cognition Cognition Arousal/Alertness: Awake/alert Overall Cognitive Status: Appears within functional limits for tasks assessed Sensation/Coordination   Extremity Assessment RUE Assessment RUE Assessment: Within Functional Limits LUE Assessment LUE Assessment: Within Functional Limits Mobility  Bed Mobility Bed Mobility: No Transfers Transfers: Yes Sit to Stand: 5: Supervision;With armrests;From chair/3-in-1 Sit to Stand Details (indicate cue type and reason): Min verbal cues for use of arm rests Exercises   End of Session OT - End of Session Equipment Utilized  During Treatment: Gait belt Activity Tolerance: Patient tolerated treatment well Patient left: in chair;with call bell in reach Nurse Communication: Mobility status for transfers General Behavior During Session: [facility] for tasks performed Cognition: [facility] for tasks performed   Salisha Bardsley, OTR/L Pager [phone] 08/11/2011, 2:49 PM

## 2011-08-11 NOTE — Progress Notes (Signed)
Clinical social worker spoke with pt son regarding pt dc plans. CSW shared with pt son that physical therapy recommending skilled nursing and home health if patient mobility improves due to heart rate. CSW will initiate skilled nursing facility search, however family is also considering home health services when patient is medically stable. RN case manager informed, and will follow up with pt son.   Catha Gosselin, Theresia Majors  [phone] .08/11/2011 14:56pm

## 2011-08-11 NOTE — Progress Notes (Signed)
HPI-  77 y/o female who presented with a complaint of increasing lumbar pain and was found to have a-fib w/ RVR and a recent episodes of melena. She was transfused PRBCs and started on an Amiodorone drip. An EGD revealed non-bleeding gastric ulcer and duodenal erosions. She continues to have a-fib w/ RVR which Cardiology is managing.  Her back pain is secondary to a new L1 compression fx diagnosed by MRI as outpt.  She underwent a kyphoplasty on 12/23 with near resolution of the back pain.  Subjective: Main complaint today is of inability to have a bowel movement/constipation. She also expresses sadness over inability to discharge home yet. Physical therapy is at the bedside but reluctant to proceed with evaluation today due to persistent rapid heart rate. We have approved mobilization if heart rate is less than or equal to 120.  Objective: Blood pressure 137/92, pulse 138, temperature 97.7 F (36.5 C), temperature source Oral, resp. rate 18, height 5\' 4"  (1.626 m), weight 70.3 kg (154 lb 15.7 oz), SpO2 96.00%.  General appearance: alert, cooperative, appears stated age  Resp: Clear to auscultation bilaterally, respiratory effort is nonlabored nontachypneic, she is on room air Cardio: Irregular rhythm-atrial fibrillation, ventricular rates between 119 and 140s even at rest,S1, S2 normal, systolic blood pressure is controlled GI: soft, non-tender; bowel sounds normal; no masses,  no organomegaly, no bowel movement since admission Extremities: extremities normal, atraumatic, no cyanosis. Resolved pitting edema.  Neurologic: Grossly normal  Lab Results:  Basename 08/11/11 0440 08/09/11 0400  WBC 9.5 8.0  HGB 10.6* 9.4*  HCT 30.9* 27.5*  PLT 249 221   BMET  Basename 08/11/11 0440 08/09/11 0400  NA 124* 126*  K 4.2 4.0  CL 90* 93*  CO2 26 26  GLUCOSE 93 103*  BUN 12 10  CREATININE 0.65 0.69  CALCIUM 8.6 8.6   Assessment/Plan:  Hypotension RESOLVED-likely etiology related to acute  blood loss anemia and rapid ventricular response in the setting of usual antihypertensive medications.  Atrial fibrillation with RVR Back in atrial fibrillation with rapid ventricular response. Cardiology has transitioned to oral amiodarone 200 mg twice a day on 08/10/2011. Due to issues with intermittent first degree block cardiology also discontinued her Cardizem at the same time. Unfortunately since then she's had difficulty with RVR and therefore as of today the cardiologist as added back in Cardizem at 180 mg daily and has doubled her amiodarone dose. The patient was on Pradaxa prior to her admission and this was resumed 08/11/2011  Melena/ GI bleed   Underwent EGD on 08/04/2011 which found several clean gastric ulcers that were nonbleeding. She has subsequently been changed from Protonix infusion to oral Protonix. Dr. Matthias Hughs would like to perform a surveillance EGD in several months to follow ulcer healing but otherwise no additional GI workup is indicated at this time.  We will avoid resuming her low-dose aspirin secondary to this issue.  Anemia associated with acute blood loss Baseline hemoglobin is is unknown. Hemoglobin at her presentation was 8.5. She has subsequently been transfused a total of 3 units of packed red blood cells since admission. Hgb remain stable and today is 10.6.  CAD (coronary artery disease) Has been on Pradaxa for over one year. Appreciate Dr. Nahser/cardiology assistance. Initial troponin I in the ER on her point-of-care panel was 0.34 but repeat troponin I with a cardiac panel was normal at less than 0.3. She denies chest pain or other cardiac ischemic symptoms and her EKG is stable. Pradaxa resumed today  Constipation Colace and MiraLAX were added 08/10/2011 with no results. We will do one bottle of magnesium citrate today  Hyponatremia This was present at admission and is likely secondary to use of thiazide diuretic.Urine studies reveal a low urine sodium and  osmolality.  We will continue to follow sodium level while we continue to hold HCTZ. Would recommend that she not be discharged home with HCTZ.   Pedal edema  Per ECHO, there in no significant left heart failure. There is noted increased Pulmonary pressure which may be contributing to some right heart failure. After being given a dose of Lasix and elevating legs, this has improved.   Lumbar back pain/subacute compression fracture She was recently evaluated by Dr. Ree Shay physician prior to admission and she had undergone office-based MRI prior to admission.  A kyphoplasty was performed on 12/23 resulting in improvement in pain.  Will obtain PT and OT consultations. Patient may benefit from a stay at [facility]  Dyslipidemia She was taking pravastatin preadmission.  Disposition Awaiting PT/OT evaluations and stability in hemoglobin. Discharged further delay due to persistent RVR.   LOS: 8 days  Junious Silk, ANP [phone]  I have personally examined this patient and reviewed the entire database. I have reviewed the above note, made any necessary editorial changes, and agree with its content.  Lonia Blood, MD [facility]

## 2011-08-11 NOTE — Progress Notes (Signed)
Subjective:   Donna Ortega is an 77 yo with a hx of CAD, MAZE procedure admitted 07/25/11 with an acute GI bleed and hypotension.  She had A-Fib with RVR that has been controlled with IV amiodarone.   She converted to NSR several days ago and is feeling better.  She had a compression fx and had kyphoplasty Monday.  She continues to have intermittant A-Fib.  She also has sinus rhythm with a 1st AV block. ( on dilt 240 qd)  She went back into A-Fib overnight.  We had stopped her diltiazem yesterday.  She denies any chest pain or dyspnea.      Marland Kitchen amiodarone  200 mg Oral BID  . docusate sodium  100 mg Oral BID  . fentaNYL  25 mcg Transdermal Q72H  . magnesium citrate  1 Bottle Oral Once  . metoprolol  2.5 mg Intravenous NOW  . metoprolol tartrate  50 mg Oral BID  . pantoprazole  40 mg Oral BID AC  . polyethylene glycol  17 g Oral Once  . pravastatin  40 mg Oral q1800  . senna  1 tablet Oral Daily  . senna-docusate  2 tablet Oral QHS  . sodium chloride  3 mL Intravenous Q12H  . DISCONTD: diltiazem  240 mg Oral Daily  . DISCONTD: metoprolol tartrate  25 mg Oral BID  . DISCONTD: polyethylene glycol  17 g Oral QHS  . DISCONTD: polyethylene glycol  17 g Oral BID      Objective:  Vital Signs in the last 24 hours: Blood pressure 137/92, pulse 119, temperature 97.7 F (36.5 C), temperature source Oral, resp. rate 18, height 5\' 4"  (1.626 m), weight 154 lb 15.7 oz (70.3 kg), SpO2 95.00%. Temp:  [97.7 F (36.5 C)] 97.7 F (36.5 C) (12/27 0436) Pulse Rate:  [106-130] 119  (12/27 0436) Resp:  [18] 18  (12/27 0436) BP: (130-137)/(69-92) 137/92 mmHg (12/27 0436) SpO2:  [93 %-95 %] 95 % (12/27 0436)  Intake/Output from previous day: 12/26 0701 - 12/27 0700 In: 1560 [P.O.:1560] Out: 900 [Urine:900] Intake/Output from this shift:    Physical Exam: The patient is alert and oriented x 3.  The mood and affect are normal.   Skin: warm and dry.  Color is normal.    HEENT:   the sclera are  nonicteric.  The mucous membranes are moist.  The carotids are 2+ without bruits.  There is no thyromegaly.  There is no JVD.    Lungs: clear.  The chest wall is non tender.    Heart: Irreg. irreg.    Abdomen: good bowel sounds.  There is no guarding or rebound.  There is no hepatosplenomegaly or tenderness.  There are no masses.   Extremities:  no clubbing, cyanosis, or edema.  The legs are without rashes.  The distal pulses are intact.   Neuro:  Cranial nerves II - XII are intact.  Motor and sensory functions are intact.     Lab Results:  Basename 08/11/11 0440 08/09/11 0400  WBC 9.5 8.0  HGB 10.6* 9.4*  PLT 249 221    Basename 08/11/11 0440 08/09/11 0400  NA 124* 126*  K 4.2 4.0  CL 90* 93*  CO2 26 26  GLUCOSE 93 103*  BUN 12 10  CREATININE 0.65 0.69   No results found for this basename: TROPONINI:2,CK,MB:2 in the last 72 hours No results found for this basename: BNP in the last 72 hours Hepatic Function Panel No results found for this basename:  PROT,ALBUMIN,AST,ALT,ALKPHOS,BILITOT,BILIDIR,IBILI in the last 72 hours Lab Results  Component Value Date   CHOL 177 07/15/2011   HDL 96.40 07/15/2011   LDLCALC 74 07/15/2011   TRIG 32.0 07/15/2011   CHOLHDL 2 07/15/2011   No results found for this basename: INR in the last 72 hours  Tele:  A-fib with RVR  Assessment/Plan:   1.  A-Fib:  She is in A-Fib at present.   We will double her Amio and restart Dilt at 180 QD  2.  CAD: stable  3. GI bleed:  Her Hb is stable.  Restart Pradaxa today . Hold ASA  Disposition: Agree with PT/OT consult.  [name], Montez Hageman., MD, [facility] 08/11/2011, 7:35 AM LOS: Day 8

## 2011-08-11 NOTE — Plan of Care (Signed)
Problem: Phase II Progression Outcomes Goal: Progress activity as tolerated unless otherwise ordered Outcome: Not Progressing Afib uncontolled, mobility restricted

## 2011-08-11 NOTE — Progress Notes (Signed)
RN called NP earlier in the shift secondary to pt alternating between Afib and Sinus Tach at a rate of 120's. Pt was in NAD and was asymptomatic. Pt received already prescribed Amiodorone and Metoprolol at 2300. Stat EKG revealed Afib with RVR of 120. NP to bedside. Pt looks well and is in NAD.   A&O x3, Denies CP, SOB, dizziness. Lopressor 2.5mg  IV given and Metoprolol increased to 50mg  po bid to start in the a.m. Amiodorone dose left same.  Maren Reamer, NP [facility]

## 2011-08-11 NOTE — Progress Notes (Signed)
Physical Therapy Evaluation Patient Details Name: Donna Ortega MRN: [id number] DOB: 1926/07/22 Today's Date: 08/11/2011  Problem List:  Patient Active Problem List  Diagnoses  . CAD (coronary artery disease)  . Atrial fibrillation with RVR  . HTN (hypertension)  . Melena  . Hypotension  . Anemia associated with acute blood loss  . Hyponatremia  . Lumbar back pain  . GI bleed  . Dyslipidemia  . Chronic kidney disease (CKD), stage III (moderate)   Pt admitted 12/10 with GI bleed and hypotension. Pt also with new L1 compression fx and underwent kyphoplasty 12/23 with good results/decreased pain.  Pt now with atrial fib and sinus tech, 1 AV block- meds are being managed to control rate.  HR elevated to 138 with bed mobility on eval so further OOB mobility unable to be assessed at this time but will need to be next session to complete goals, and assist with d/c plan.  Pt reports family works from home and can provide S? Past Medical History:  Past Medical History  Diagnosis Date  . Coronary artery disease 2007    3 vessel CABG with LIMA-LAD, SVG to diag, and SVG - Ramus  . Hyperlipidemia   . Hypertension   . Atrial fibrillation     paroxysmal  . History of hyperkalemia   . Hyponatremia   . Diverticulitis    Past Surgical History:  Past Surgical History  Procedure Date  . Maze   . Coronary artery bypass graft   . Partial hysterectomy   . Esophagogastroduodenoscopy 08/04/2011    Procedure: ESOPHAGOGASTRODUODENOSCOPY (EGD);  Surgeon: Florencia Reasons, MD;  Location: [facility] ENDOSCOPY;  Service: Endoscopy;  Laterality: N/A;  do at bedside    PT Assessment/Plan/Recommendation PT Assessment Clinical Impression Statement: PT eval limited by atrial fib, HR 138 seated EOB so further mobility not assessed d/t uncontrolled HR. MD repots meds are being adjusted. Pt reports she has ambulated to bathroom with nursing but she does feel a little unsteady with her walking. PT to further assess  mobility when HR allows and goals will be set a t this time. MD states therapy can mobilize pt if HR <120. PT Recommendation/Assessment: Patient will need skilled PT in the acute care venue PT Problem List: Decreased activity tolerance;Decreased balance;Decreased mobility Barriers to Discharge: None PT Therapy Diagnosis : Difficulty walking (impaired cadriovascular tolerance/status) PT Plan PT Frequency: Min 3X/week PT Treatment/Interventions: DME instruction;Gait training;Stair training;Balance training;Functional mobility training;Therapeutic activities;Therapeutic exercise;Patient/family education PT Recommendation Follow Up Recommendations: Home health PT (may be altered once mobility assessed) Equipment Recommended:  (TBD) PT Goals  Acute Rehab PT Goals PT Goal Formulation: With patient Time For Goal Achievement: 2 weeks Pt will go Supine/Side to Sit: Independently Pt will go Sit to Supine/Side: Independently Further goals TBA PT Evaluation Precautions/Restrictions  Precautions Precautions: Fall Precaution Comments: pt reports MD told her to wear corset when OOB walking Required Braces or Orthoses: Yes Spinal Brace: Lumbar corset Restrictions Weight Bearing Restrictions: No Prior Functioning  Home Living Lives With: Son (dtr in Social worker) Receives Help From: Family Type of Home: House Home Layout: Two level Alternate Level Stairs-Rails: Right Alternate Level Stairs-Number of Steps: 12 Home Access: Stairs to enter Entrance Stairs-Rails: None Entrance Stairs-Number of Steps: 2 Home Adaptive Equipment: None Prior Function Level of Independence: Independent with basic ADLs;Independent with transfers;Independent with homemaking with ambulation Cognition Cognition Arousal/Alertness: Awake/alert Overall Cognitive Status: Appears within functional limits for tasks assessed Sensation/Coordination Sensation Light Touch: Appears Intact Proprioception: Appears Intact Extremity  Assessment  RLE Assessment RLE Assessment: Within Functional Limits LLE Assessment LLE Assessment: Within Functional Limits Mobility (including Balance) Bed Mobility Bed Mobility: Yes Supine to Sit: 4: Min assist;HOB flat Sit to Supine - Left: 5: Supervision Scooting to HOB: 5: Supervision Transfers Transfers: No Ambulation/Gait Ambulation/Gait: No Stairs: No  Static Sitting Balance Static Sitting - Balance Support: Feet supported Static Sitting - Level of Assistance: 7: Independent Dynamic Sitting Balance Dynamic Sitting - Balance Support: No upper extremity supported Dynamic Sitting - Level of Assistance: 7: Independent Exercise    End of Session PT - End of Session Activity Tolerance: Treatment limited secondary to medical complications (Comment) (atrial fib) Patient left: in bed Nurse Communication: Other (comment) (RN and MD informed of HR 138 seated) General Behavior During Session: [facility] for tasks performed Cognition: [facility] for tasks performed PT status discussed with RN and MD.   Michaelene Song 08/11/2011, 9:16 AM

## 2011-08-11 NOTE — Progress Notes (Signed)
Pt. HR in the 120-130's, going in and out of A-fib and Sinus tachycardia, pt asymptomatic. MD made aware, gave orders and orders were carried out. Will continue to monitor pt.

## 2011-08-12 DIAGNOSIS — I4891 Unspecified atrial fibrillation: Secondary | ICD-10-CM

## 2011-08-12 LAB — CBC
Hemoglobin: 10.5 g/dL — ABNORMAL LOW (ref 12.0–15.0)
MCH: 30.6 pg (ref 26.0–34.0)
MCV: 88.9 fL (ref 78.0–100.0)
RBC: 3.43 MIL/uL — ABNORMAL LOW (ref 3.87–5.11)

## 2011-08-12 MED ORDER — DILTIAZEM HCL 60 MG PO TABS
60.0000 mg | ORAL_TABLET | Freq: Once | ORAL | Status: AC
  Administered 2011-08-12: 60 mg via ORAL
  Filled 2011-08-12: qty 1

## 2011-08-12 MED ORDER — DILTIAZEM HCL ER COATED BEADS 240 MG PO CP24
240.0000 mg | ORAL_CAPSULE | Freq: Every day | ORAL | Status: DC
  Administered 2011-08-13 – 2011-08-14 (×2): 240 mg via ORAL
  Filled 2011-08-12 (×2): qty 1

## 2011-08-12 NOTE — Progress Notes (Signed)
HPI-  77 y/o female who presented with a complaint of increasing lumbar pain and was found to have a-fib w/ RVR and a recent episodes of melena. She was transfused PRBCs and started on an Amiodorone drip. An EGD revealed non-bleeding gastric ulcer and duodenal erosions. She continues to have a-fib w/ RVR which Cardiology is managing.  Her back pain is secondary to a new L1 compression fx diagnosed by MRI as outpt.  She underwent a kyphoplasty on 12/23 with near resolution of the back pain.  Subjective: Endorses feeling much better today. Continues to not have any awareness of tachycardia/palpitations when they occur. After ingesting magnesium citrate yesterday she had 2 large bowel movements and endorses feels much better. She requested to not have a nightly laxative on order.  Objective: Blood pressure 117/68, pulse 95, temperature 98.1 F (36.7 C), temperature source Oral, resp. rate 16, height 5\' 4"  (1.626 m), weight 70.3 kg (154 lb 15.7 oz), SpO2 96.00%.  General appearance: alert, cooperative, appears stated age  Resp: Clear to auscultation bilaterally, respiratory effort is nonlabored nontachypneic, she is on room air Cardio: Irregular rhythm-atrial fibrillation alternating between sinus rhythm and sinus tachycardia, ventricular rates between 79 and 138, S1, S2 normal, systolic blood pressure is controlled GI: soft, non-tender; bowel sounds normal; no masses,  no organomegaly, no bowel movement since admission, large bowel movement x2 yesterday evening Extremities: extremities normal, atraumatic, no cyanosis. Resolved pitting edema.  Neurologic: Grossly normal  Lab Results:  Basename 08/12/11 0500 08/11/11 0440  WBC 8.8 9.5  HGB 10.5* 10.6*  HCT 30.5* 30.9*  PLT 260 249   BMET  Basename 08/11/11 0440  NA 124*  K 4.2  CL 90*  CO2 26  GLUCOSE 93  BUN 12  CREATININE 0.65  CALCIUM 8.6   Assessment/Plan:  Hypotension RESOLVED-likely etiology related to acute blood loss  anemia and rapid ventricular response in the setting of usual antihypertensive medications.  Atrial fibrillation with RVR Back in atrial fibrillation with rapid ventricular response. Cardiology has transitioned to oral amiodarone 200 mg twice a day on 08/10/2011. Due to issues with intermittent first degree block cardiology also discontinued her Cardizem at the same time. Unfortunately since then she's had difficulty with RVR and therefore on 08/11/2011  the cardiologist added back Cardizem at 180 mg daily and doubled her amiodarone dose. The cardiologist notes today that we may then need to increase the Cardizem further if issues with rate control persist.  Melena/ GI bleed   Underwent EGD on 08/04/2011 which found several clean gastric ulcers that were nonbleeding. She has subsequently been changed from Protonix infusion to oral Protonix. Dr. Matthias Hughs would like to perform a surveillance EGD in several months to follow ulcer healing but otherwise no additional GI workup is indicated at this time.  We will avoid resuming her low-dose aspirin secondary to this issue.   Anemia associated with acute blood loss Baseline hemoglobin is is unknown. Hemoglobin at her presentation was 8.5. She has subsequently been transfused a total of 3 units of packed red blood cells since admission. Hgb remain stable and today is 10.5.  CAD (coronary artery disease) Has been on Pradaxa for over one year. Appreciate Dr. Nahser/cardiology assistance. Initial troponin I in the ER on her point-of-care panel was 0.34 but repeat troponin I with a cardiac panel was normal at less than 0.3. She denies chest pain or other cardiac ischemic symptoms and her EKG is stable. Pradaxa resumed 08/11/2011-watch for GI bleeding complications.  Constipation Colace and  MiraLAX were added 08/10/2011 with no results. She was given one bottle of magnesium citrate 08/11/2011 with excellent results. We will continue the Colace as ordered but change  the MiraLAX to when necessary for constipation.  Hyponatremia This was present at admission and is likely secondary to use of thiazide diuretic.Urine studies reveal a low urine sodium and osmolality.  We will continue to follow sodium level while we continue to hold HCTZ. Would recommend that she not be discharged home with HCTZ. 08/12/2011 sodium is still low 124 but this is within the range she has been at during the entire hospitalization.  Pedal edema  Per ECHO, there in no significant left heart failure. There is noted increased Pulmonary pressure which may be contributing to some right heart failure. After being given a dose of Lasix and elevating legs, this has improved.   Lumbar back pain/subacute compression fracture She was recently evaluated by Dr. Ree Shay physician prior to admission and she had undergone office-based MRI prior to admission.  A kyphoplasty was performed on 12/23 resulting in improvement in pain.  Physical therapy has evaluated this patient and is now stating that she does not require skilled nursing facility placement.  Family was hopeful for a short-term skilled nursing facility rehabilitation stay.  Clinical social worker is discussing the situation with the family and we'll help arrange home health if family feels they're able to care for the patient at home.  Dyslipidemia Continue pravastatin.  Disposition Discharge further delayed due to persistent RVR.   LOS: 9 days  Junious Silk, ANP [phone]  I have personally examined this patient and reviewed the entire database. I have reviewed the above note, made any necessary editorial changes, and agree with its content.  Lonia Blood, MD [facility]

## 2011-08-12 NOTE — Progress Notes (Signed)
   CARE MANAGEMENT NOTE 08/12/2011  Patient:  GREGG, HOLSTER   Account Number:  [id number]  Date Initiated:  08/04/2011  Documentation initiated by:  Onnie Boer  Subjective/Objective Assessment:   PT WAS ADMITTED WITH A FIB W/ RVR     Action/Plan:   PROGRESSION OF CARE AND DISCHARGE PLANNING   Anticipated DC Date:  08/08/2011   Anticipated DC Plan:  HOME/SELF CARE      DC Planning Services  CM consult      Choice offered to / List presented to:             Status of service:  In process, will continue to follow Medicare Important Message given?   (If response is "NO", the following Medicare IM given date fields will be blank) Date Medicare IM given:   Date Additional Medicare IM given:    Discharge Disposition:    Per UR Regulation:  Reviewed for med. necessity/level of care/duration of stay  Comments:  UR COMPLETED 08/04/2011 Onnie Boer, RN, BSN 1432 PT WAS ADMITTED WITH A FIB W/ RVR, PTA PT WAS FROM HOME WITH SELF CARE  08/12/2011 Onnie Boer, RN, BSN 1633 PT WILL NEED HH PT AND ROLLING WALKER AT DC. FAMILY FEELS THAT SHE MAY NEED A BED ALSO.   SPOKE WITH SON  DOUGALS [place] ( SON) TODAY AT [phone] OFFERING CHOICE, HE WAS UNDECIDED SO I WILL LEAVE A LIST FOR THE PT. IF PT IS TO DC OVER THE WEEKEND  PT HAS MEDICARE [employer] ALSO.  WILL F/U.

## 2011-08-12 NOTE — Progress Notes (Signed)
Patient Name: Donna Ortega Date of Encounter: 08/12/2011     Principal Problem:  *Hypotension Active Problems:  CAD (coronary artery disease)  Atrial fibrillation with RVR  HTN (hypertension)  Melena  Anemia associated with acute blood loss  Hyponatremia  Lumbar back pain  GI bleed  Dyslipidemia  Chronic kidney disease (CKD), stage III (moderate)    SUBJECTIVE: Pt feels well this morning. Denies chest pain, palpitations, dizziness, SOB, diaphoresis and n/v. Sitting comfortably in chair.   OBJECTIVE  Filed Vitals:   08/11/11 0903 08/11/11 1343 08/11/11 2105 08/12/11 0424  BP:  131/65 143/83 117/68  Pulse: 138 79 131 95  Temp:  97.3 F (36.3 C) 98 F (36.7 C) 98.1 F (36.7 C)  TempSrc:  Oral Oral Oral  Resp:  18 24 16   Height:      Weight:      SpO2: 96% 93% 96% 96%    Intake/Output Summary (Last 24 hours) at 08/12/11 1040 Last data filed at 08/12/11 0630  Gross per 24 hour  Intake    400 ml  Output    902 ml  Net   -502 ml   Weight change:   PHYSICAL EXAM  General: Elderly, NAD Head: Normocephalic, atraumatic, sclera non-icteric, no xanthomas, nares are without discharge.  Neck: Supple without bruits or JVD. Lungs:  Resp regular and unlabored, CTAB, no wheezes, rales or rhonchi Heart: irregularly irregular no s3, s4, or murmurs, rubes, heaves, gallops or thrills Abdomen: Soft, non-tender, non-distended, BS + x 4.  Msk:  Strength and tone appears normal for age. Extremities: No clubbing, cyanosis or edema. DP/PT/Radials 2+ and equal bilaterally. Neuro: Alert and oriented X 3. Moves all extremities spontaneously. Psych: Normal affect.  LABS:  Recent Labs  Basename 08/12/11 0500 08/11/11 0440   WBC 8.8 9.5   HGB 10.5* 10.6*   HCT 30.5* 30.9*   MCV 88.9 89.6   PLT 260 249   No results found for this basename: VITAMINB12,FOLATE,FERRITIN,TIBC,IRON,RETICCTPCT in the last 72 hours No results found for this basename: DDIMER:2 in the last 72  hours  Lab 08/11/11 0440 08/09/11 0400 08/08/11 0500  NA 124* 126* 125*  K 4.2 4.0 3.7  CL 90* 93* 90*  CO2 26 26 25   BUN 12 10 11   CREATININE 0.65 0.69 0.68  CALCIUM 8.6 8.6 8.8  PROT -- -- --  BILITOT -- -- --  ALKPHOS -- -- --  ALT -- -- --  AST -- -- --  AMYLASE -- -- --  LIPASE -- -- --  GLUCOSE 93 103* 96   No results found for this basename: HGBA1C in the last 72 hours No results found for this basename: CKTOTAL:3,CKMB:3,CKMBINDEX:3,TROPONINI:3 in the last 72 hours No components found with this basename: POCBNP No results found for this basename: CHOL,HDL,LDLCALC,TRIG,CHOLHDL,LDLDIRECT in the last 72 hours No results found for this basename: TSH,T4TOTAL,FREET3,T3FREE,THYROIDAB in the last 72 hours   TELE: sinus tachycardia with intermittent a fib, 1st degree block, 100-130 bpm  ECG: None reported    Radiology/Studies:  Dg Lumbar Spine 2-3 Views  08/05/2011  *RADIOLOGY REPORT*  Clinical Data: Back pain.  LUMBAR SPINE - 2-3 VIEW  Comparison: None.  Findings: Severe compression fractures are noted through the T10, L1, and L3 vertebral bodies, age indeterminate.  Slight compression fracture is likely present in the superior endplate of T12.  There is diffuse osteopenia.  Slight anterolisthesis of L4 on L5 likely related to mild facet disease.  Aorta is calcified, non-aneurysmal. SI joints  are symmetric and unremarkable.  IMPRESSION: Severe T10, L1 and L3 compression fractures, age indeterminate. Suspect slight compression through the superior endplate of T12.  Original Report Authenticated By: Cyndie Chime, M.D.   Dg Abd 1 View  08/03/2011  *RADIOLOGY REPORT*  Clinical Data: Rule out free abdominal air  ABDOMEN - 1 VIEW  Comparison: Upright view of the chest same day  Findings: There is a nonspecific nonobstructive bowel gas pattern. Moderate stool and gas noted throughout the colon.  No free abdominal air is noted on the upright view of the chest.  IMPRESSION: Nonspecific  nonobstructive bowel gas pattern.  Moderate stool and gas noted throughout the colon.  Original Report Authenticated By: Natasha Mead, M.D.   Dg Chest Port 1 View  08/03/2011  *RADIOLOGY REPORT*  Clinical Data: Chest pain  PORTABLE CHEST - 1 VIEW  Comparison: 10/03/2005  Findings: Cardiomediastinal silhouette is stable.  Status post median sternotomy again noted.  No acute infiltrate or pleural effusion.  No pulmonary edema.  IMPRESSION: No active disease.  Status post median sternotomy again noted.  Original Report Authenticated By: Natasha Mead, M.D.   Ir Vertebroplasty Or Sacroplasty  08/08/2011  *RADIOLOGY REPORT*  Clinical Data:  Severe low back pain secondary to compression fracture at L1.  KYPHOPLASTY AT L1  Comparison:  MRI scan of the lumbosacral spine of a few days ago from outside institution.  Following a full explanation of the procedure along with the potential associated complications, an informed witnessed consent was obtained.  The patient was placed prone on the fluoroscopic table.  The skin overlying the lumbar region was then prepped and draped in the usual sterile fashion.  The right pedicle at L1 was then infiltrated with 0.25% bupivacaine, followed by the advancement of an 11-gauge Jamshidi needle into the posterior third at L1.  This was then exchanged out for the Kyphon advanced osteo introducer system comprised of a working cannula and a Kyphon osteo drill over a Kyphon osteo bone pin.  This combination was then advanced until the tip of the Kyphon osteo drill was in the posterior third.  At this time the bone pin was removed.  In a medial trajectory, the combination was advanced until the tip of the working cannula was in the posterior third at L1.  Crossing of the midline by the osteo drill was seen.  The osteo drill was then removed and a core sample sent for pathologic analysis.  Through the working cannula, the Kyphon bone biopsy device was then advanced within 5 mm of the anterior  aspect of L1.  A core sample from this was also sent for pathologic analysis.  Through the working cannula, a Kyphon inflatable bone tamp 20 x 3 was advanced and positioned with the distal markers 7 mm from the anterior aspect of L1.  This was then expanded with contrast using the Kyphon inflation syringe device via microtubing until there was apposition with the superior and the inferior endplates.  At this time methylmethacrylate mixture was reconstituted with Tobramycin in the Kyphon bone mixing system.  This was then loaded onto Kyphon bone fillers.  The balloon was deflated and removed followed by the instillation of 3.5 bone filler equivalents of methylmethacrylate mixture at L1 with excellent filling in the AP and lateral projections.  There was no extrusion noted into disc spaces or posteriorly into the spinal canal.  No paraspinous venous examination was seen.  The working cannula and the bone filler were then removed. Hemostasis was achieved  at the skin entry site.  The patient tolerated the procedure well.  There were no acute complications.  Medications Utilized:  Versed 4 mg IV.  Fentanyl 100 mcg IV.  Ancef 1 gram IV.  IMPRESSION: 1. Status post fluoroscopic-guided needle placement for deep core bone biopsy, L1. 2. Status post vertebral body augmentation for painful osteoporotic compression fracture at L1 using the balloon kyphoplasty technique.  Original Report Authenticated By: Oneal Grout, M.D.    Current Medications:     . amiodarone  400 mg Oral BID  . dabigatran  150 mg Oral Q12H  . diltiazem  180 mg Oral Daily  . docusate sodium  100 mg Oral BID  . fentaNYL  25 mcg Transdermal Q72H  . metoprolol tartrate  50 mg Oral BID  . pantoprazole  40 mg Oral BID AC  . pravastatin  40 mg Oral q1800  . senna  1 tablet Oral Daily  . senna-docusate  2 tablet Oral QHS  . sodium chloride  3 mL Intravenous Q12H    ASSESSMENT AND PLAN:  1. Paroxysmal Atrial fibrillation- pt converting  to sinus tachycardia and back to a fib presently likely due to surgical stress s/p kyphoplasty Monday; HR better controlled, but still >100 and fluctuating this morning. Amiodarone doubled and Dilt 180mg  daily added yesterday.   - May need to titrate Dilt to 240 as she was adequately rate-controlled with this dose on 12/26, will discuss  with MD regarding medication adjustements  - H/H stable, continue Pradaxa   2. CAD- pt asymptomatic, no evidence of ischemia  3. GIB- H/H stable; ASA held, on Pradaxa  Disposition- HR better controlled today, pt would benefit from ambulation with PT/OT eval   Signed, R. Hurman Horn, PA-C 08/12/2011, 10:40 AM  Patient seen, examined. Available data reviewed. Agree with findings, assessment, and plan as outlined by Hurman Horn.  Pt with recurrent atrial fib on Amio and Diltiazem. She otherwise is progressing well. Recommend increase diltiazem to 240 mg daily. She is back on Pradaxa with stable H/H.  Tonny Bollman, M.D. 08/12/2011 4:05 PM

## 2011-08-12 NOTE — Progress Notes (Signed)
Physical Therapy Treatment Patient Details Name: Donna Ortega MRN: [id number] DOB: 1926-01-12 Today's Date: 08/12/2011  PT Assessment/Plan  PT - Assessment/Plan Comments on Treatment Session: Pt moving well with HR <100 with all activity ranging from 71-93 bpm. Pt educated for back precautions and RW use for safe discharge. Will continue to follow acutely to reinforce education and maximize function. PT Plan: Discharge plan remains appropriate;Frequency remains appropriate Follow Up Recommendations: Home health PT Equipment Recommended: Rolling walker with 5" wheels (short RW) PT Goals  Acute Rehab PT Goals Pt will go Supine/Side to Sit: with modified independence PT Goal: Supine/Side to Sit - Progress: Revised (modified due to lack of progress/goal met) Pt will go Sit to Supine/Side: with modified independence PT Goal: Sit to Supine/Side - Progress: Revised (modified due to lack of progress/goal met) Pt will Ambulate: >150 feet;with modified independence;with rolling walker PT Goal: Ambulate - Progress: Other (comment) Additional Goals Additional Goal #1: Pt will independently state and maintain 3/3 back precautions with activity. PT Goal: Additional Goal #1 - Progress: Other (comment)  PT Treatment Precautions/Restrictions  Precautions Precautions: Back Precaution Booklet Issued: Yes (comment) (back handout provided) Precaution Comments: pt reports MD told her to wear corset when OOB walking Required Braces or Orthoses: Yes Spinal Brace: Lumbar corset, pt max assist to don brace, independent to doff Restrictions Weight Bearing Restrictions: No Mobility (including Balance) Bed Mobility Rolling Left: 5: Supervision;With rail Rolling Left Details (indicate cue type and reason): cueing not to twist Left Sidelying to Sit: 5: Supervision;With rails;HOB flat Left Sidelying to Sit Details (indicate cue type and reason): cueing for proper sequence with back precautions Sitting - Scoot  to Edge of Bed: 6: Modified independent (Device/Increase time) Sit to Supine - Left: HOB flat;4: Min assist Sit to Supine - Left Details (indicate cue type and reason): cueing for UE positioning and sequence. Assist to elevate legs into bed Transfers Sit to Stand: 5: Supervision;From bed Sit to Stand Details (indicate cue type and reason): cueing for posture Stand to Sit: 6: Modified independent (Device/Increase time);To bed Ambulation/Gait Ambulation/Gait: Yes Ambulation/Gait Assistance: 5: Supervision Ambulation/Gait Assistance Details (indicate cue type and reason): cueing for posture and position in RW. Pt initiated gait without AD but was grabbing for rail, more fluid and comfortable with RW Ambulation Distance (Feet): 450 Feet Assistive device: Rolling walker Gait Pattern: Within Functional Limits;Decreased stride length Stairs: Yes Stairs Assistance: 4: Min assist;5: Supervision Stairs Assistance Details (indicate cue type and reason): min assist with rail and hand held to reciprocally ascend. cueing for no twisting descending with just rail to Left Stair Management Technique: One rail Right;Forwards Number of Stairs: 11  Height of Stairs: 8   Posture/Postural Control Posture/Postural Control: No significant limitations Static Sitting Balance Static Sitting - Level of Assistance: 7: Independent Exercise    End of Session PT - End of Session Equipment Utilized During Treatment: Back brace Activity Tolerance: Patient tolerated treatment well Patient left: in bed;with call bell in reach Nurse Communication: Mobility status for transfers;Mobility status for ambulation General Behavior During Session: [facility] for tasks performed Cognition: [facility] for tasks performed  Delorse Lek 08/12/2011, 3:48 PM Toney Sang, PT [phone]

## 2011-08-13 LAB — BASIC METABOLIC PANEL
BUN: 15 mg/dL (ref 6–23)
CO2: 25 mEq/L (ref 19–32)
Calcium: 8.7 mg/dL (ref 8.4–10.5)
Creatinine, Ser: 0.76 mg/dL (ref 0.50–1.10)
Glucose, Bld: 84 mg/dL (ref 70–99)

## 2011-08-13 LAB — CBC
MCH: 30.1 pg (ref 26.0–34.0)
MCHC: 33.9 g/dL (ref 30.0–36.0)
MCV: 88.8 fL (ref 78.0–100.0)
Platelets: 285 10*3/uL (ref 150–400)
RBC: 3.49 MIL/uL — ABNORMAL LOW (ref 3.87–5.11)
RDW: 13.1 % (ref 11.5–15.5)

## 2011-08-13 NOTE — Progress Notes (Signed)
Patient ID: Donna Ortega, female   DOB: 09-13-1925, 77 y.o.   MRN: [id number]   Patient Name: Donna Ortega Date of Encounter: 08/13/2011     Principal Problem:  *Hypotension Active Problems:  CAD (coronary artery disease)  Atrial fibrillation with RVR  HTN (hypertension)  Melena  Anemia associated with acute blood loss  Hyponatremia  Lumbar back pain  GI bleed  Dyslipidemia  Chronic kidney disease (CKD), stage III (moderate)    SUBJECTIVE: Pt feels well this morning. Denies chest pain, palpitations, dizziness, SOB, diaphoresis and n/v. Sitting comfortably in chair.   OBJECTIVE  Filed Vitals:   08/12/11 1329 08/12/11 1540 08/12/11 2116 08/13/11 0554  BP: 105/60  111/57 132/81  Pulse: 82 83 81 83  Temp: 98.9 F (37.2 C)  98.1 F (36.7 C) 98.5 F (36.9 C)  TempSrc: Oral  Oral Oral  Resp: 20  20 16   Height:      Weight:      SpO2: 96%  93% 94%    Intake/Output Summary (Last 24 hours) at 08/13/11 1219 Last data filed at 08/13/11 4098  Gross per 24 hour  Intake    600 ml  Output    252 ml  Net    348 ml   Weight change:   PHYSICAL EXAM  General: Elderly, NAD Head: Normocephalic, atraumatic, sclera non-icteric, no xanthomas, nares are without discharge.  Neck: Supple without bruits or JVD. Lungs:  Resp regular and unlabored, CTAB, no wheezes, rales or rhonchi Heart: irregularly irregular no s3, s4, or murmurs, rubes, heaves, gallops or thrills Abdomen: Soft, non-tender, non-distended, BS + x 4.  Msk:  Strength and tone appears normal for age. Extremities: 1+ ankle edema bilaterally. DP/PT/Radials 2+ and equal bilaterally. Neuro: Alert and oriented X 3. Moves all extremities spontaneously. Psych: Normal affect.  LABS:  Recent Labs  Basename 08/13/11 0600 08/12/11 0500   WBC 8.1 8.8   HGB 10.5* 10.5*   HCT 31.0* 30.5*   MCV 88.8 88.9   PLT 285 260   No results found for this basename: VITAMINB12,FOLATE,FERRITIN,TIBC,IRON,RETICCTPCT in the last 72  hours No results found for this basename: DDIMER:2 in the last 72 hours  Lab 08/13/11 0600 08/11/11 0440 08/09/11 0400  NA 125* 124* 126*  K 4.0 4.2 4.0  CL 90* 90* 93*  CO2 25 26 26   BUN 15 12 10   CREATININE 0.76 0.65 0.69  CALCIUM 8.7 8.6 8.6  PROT -- -- --  BILITOT -- -- --  ALKPHOS -- -- --  ALT -- -- --  AST -- -- --  AMYLASE -- -- --  LIPASE -- -- --  GLUCOSE 84 93 103*   No results found for this basename: HGBA1C in the last 72 hours No results found for this basename: CKTOTAL:3,CKMB:3,CKMBINDEX:3,TROPONINI:3 in the last 72 hours No components found with this basename: POCBNP No results found for this basename: CHOL,HDL,LDLCALC,TRIG,CHOLHDL,LDLDIRECT in the last 72 hours No results found for this basename: TSH,T4TOTAL,FREET3,T3FREE,THYROIDAB in the last 72 hours   TELE: atrial fibrillation, HR in 70s-80s  Current Medications:     . amiodarone  400 mg Oral BID  . dabigatran  150 mg Oral Q12H  . diltiazem  240 mg Oral Daily  . diltiazem  60 mg Oral Once  . docusate sodium  100 mg Oral BID  . fentaNYL  25 mcg Transdermal Q72H  . metoprolol tartrate  50 mg Oral BID  . pantoprazole  40 mg Oral BID AC  . pravastatin  40 mg Oral q1800  . senna  1 tablet Oral Daily  . senna-docusate  2 tablet Oral QHS  . sodium chloride  3 mL Intravenous Q12H  . DISCONTD: diltiazem  180 mg Oral Daily    ASSESSMENT AND PLAN:  1. Paroxysmal Atrial fibrillation-Patient back in atrial fibrillation but heart rate controlled.  Would continue amiodarone at current dose, hopefully as builds in system will lessen frequency of atrial fibrillation.  Continue metoprolol, diltiazem, and Pradaxa.   2. CAD- pt asymptomatic, no evidence of ischemia  3. GIB- H/H stable; ASA held, on Pradaxa  4. Disposition- per primary team, likely nearing discharge.   Marca Ancona 08/13/2011 12:21 PM

## 2011-08-14 DIAGNOSIS — Z9889 Other specified postprocedural states: Secondary | ICD-10-CM | POA: Insufficient documentation

## 2011-08-14 DIAGNOSIS — S32000A Wedge compression fracture of unspecified lumbar vertebra, initial encounter for closed fracture: Secondary | ICD-10-CM | POA: Diagnosis present

## 2011-08-14 MED ORDER — PANTOPRAZOLE SODIUM 40 MG PO TBEC: 40.0000 mg | DELAYED_RELEASE_TABLET | Freq: Two times a day (BID) | ORAL | Status: DC

## 2011-08-14 MED ORDER — DILTIAZEM HCL ER COATED BEADS 240 MG PO CP24: 240.0000 mg | ORAL_CAPSULE | Freq: Every day | ORAL | Status: DC

## 2011-08-14 MED ORDER — METOPROLOL TARTRATE 50 MG PO TABS: 50.0000 mg | ORAL_TABLET | Freq: Two times a day (BID) | ORAL | Status: DC

## 2011-08-14 MED ORDER — AMIODARONE HCL 400 MG PO TABS: 200.0000 mg | ORAL_TABLET | Freq: Two times a day (BID) | ORAL | Status: DC

## 2011-08-14 MED ORDER — AMIODARONE HCL 400 MG PO TABS: 400.0000 mg | ORAL_TABLET | Freq: Two times a day (BID) | ORAL | Status: DC

## 2011-08-14 NOTE — Progress Notes (Signed)
Pt discharged home with family. IV site d/c. Site WNL. Discharge instructions and patient education complete. Pt. Had no further questions or concerns. D/C via wheelchair with son. Dion Saucier

## 2011-08-14 NOTE — Progress Notes (Signed)
HPI-  77 y/o female who presented with a complaint of increasing lumbar pain and was found to have a-fib w/ RVR and a recent episodes of melena. She was transfused PRBCs and started on an Amiodorone drip. An EGD revealed non-bleeding gastric ulcer and duodenal erosions. She continues to have a-fib w/ RVR which Cardiology is managing.  Her back pain is secondary to a new L1 compression fx diagnosed by MRI as outpt.  She underwent a kyphoplasty on 12/23 with near resolution of the back pain.  Subjective: No complaints today. Anxious to go home.   Objective: Blood pressure 112/61, pulse 84, temperature 97.9 F (36.6 C), temperature source Oral, resp. rate 18, height 5\' 4"  (1.626 m), weight 70.3 kg (154 lb 15.7 oz), SpO2 94.00%.  General appearance: alert, cooperative, appears stated age  Resp: Clear to auscultation bilaterally, respiratory effort is nonlabored nontachypneic, she is on room air Cardio: Irregular rhythm-atrial fibrillation alternating between sinus rhythm and sinus tachycardia, ventricular rates between 79 and 138, S1, S2 normal, systolic blood pressure is controlled GI: soft, non-tender; bowel sounds normal; no masses,  no organomegaly, no bowel movement since admission, large bowel movement x2 yesterday evening Extremities: extremities normal, atraumatic, no cyanosis. Resolved pitting edema.  Neurologic: Grossly normal  Lab Results:  Basename 08/13/11 0600 08/12/11 0500  WBC 8.1 8.8  HGB 10.5* 10.5*  HCT 31.0* 30.5*  PLT 285 260   BMET  Basename 08/13/11 0600  NA 125*  K 4.0  CL 90*  CO2 25  GLUCOSE 84  BUN 15  CREATININE 0.76  CALCIUM 8.7   Assessment/Plan:  Hypotension RESOLVED-likely etiology related to acute blood loss anemia and rapid ventricular response in the setting of usual antihypertensive medications.  Atrial fibrillation with RVR Back in atrial fibrillation with rapid ventricular response. Cardiology has transitioned to oral amiodarone 200 mg  twice a day on 08/10/2011. Due to issues with intermittent first degree block cardiology also discontinued her Cardizem at the same time. Unfortunately since then she's had difficulty with RVR and therefore on 08/11/2011  the cardiologist added back Cardizem at 180 mg daily and doubled her amiodarone dose. The cardiologist notes today that we may then need to increase the Cardizem further if issues with rate control persist.  Melena/ GI bleed   Underwent EGD on 08/04/2011 which found several clean gastric ulcers that were nonbleeding. She has subsequently been changed from Protonix infusion to oral Protonix. Dr. Matthias Hughs would like to perform a surveillance EGD in several months to follow ulcer healing but otherwise no additional GI workup is indicated at this time.  We will avoid resuming her low-dose aspirin secondary to this issue.   Anemia associated with acute blood loss Baseline hemoglobin is is unknown. Hemoglobin at her presentation was 8.5. She has subsequently been transfused a total of 3 units of packed red blood cells since admission. Hgb remain stable and today is 10.5.  CAD (coronary artery disease) Has been on Pradaxa for over one year. Appreciate Dr. Nahser/cardiology assistance. Initial troponin I in the ER on her point-of-care panel was 0.34 but repeat troponin I with a cardiac panel was normal at less than 0.3. She denies chest pain or other cardiac ischemic symptoms and her EKG is stable. Pradaxa resumed 08/11/2011-watch for GI bleeding complications.  Constipation Colace and MiraLAX were added 08/10/2011 with no results. She was given one bottle of magnesium citrate 08/11/2011 with excellent results. We will continue the Colace as ordered but change the MiraLAX to when necessary for  constipation.  Hyponatremia This was present at admission and is likely secondary to use of thiazide diuretic.Urine studies reveal a low urine sodium and osmolality.  We will continue to follow sodium  level while we continue to hold HCTZ. Would recommend that she not be discharged home with HCTZ. 08/12/2011 sodium is still low 124 but this is within the range she has been at during the entire hospitalization.  Pedal edema  Per ECHO, there in no significant left heart failure. There is noted increased Pulmonary pressure which may be contributing to some right heart failure. After being given a dose of Lasix and elevating legs, this has improved.   Lumbar back pain/subacute compression fracture She was recently evaluated by Dr. Ree Shay physician prior to admission and she had undergone office-based MRI prior to admission.  A kyphoplasty was performed on 12/23 resulting in improvement in pain.  Physical therapy has evaluated this patient and is now stating that she does not require skilled nursing facility placement. Patient is wanting to return home. Clinical social worker is discussing the situation with the family and we'll help arrange home health if family feels they're able to care for the patient at home.  Dyslipidemia Continue pravastatin.  Disposition Discharge further delayed due to persistent RVR.  LOS 10 days 08/13/11 Calvert Cantor MD [phone]

## 2011-08-14 NOTE — Discharge Summary (Signed)
DISCHARGE SUMMARY  Donna Ortega  MR#: [id number]  DOB:[birth year]  Date of Admission: 08/03/2011 Date of Discharge: 08/14/2011  Attending Physician:Keighley Deckman  Patient's WUJ:WJXBJY,NWGNF E, MD, MD  Consults:Treatment Team:  Jeanelle Malling cardiology Bernette Redbird MD- GI Ranee Gosselin- Orthopedics   Presenting Complaint: Lower Back pain   Discharge Diagnoses:  Peptic ulcer disease with hemorrhage (melena)  Anemia associated with acute blood loss  Hypotension  Atrial fibrillation with RVR  Hyponatremia-  Lumbar back pain/ Lumbar compression fracture S/P vertebroplasty on 12/23 HTN (hypertension) CAD (coronary artery disease) Dyslipidemia Chronic kidney disease (CKD), stage III (moderate)     Discharge Medications: Current Discharge Medication List    START taking these medications   Details  amiodarone (PACERONE) 200 MG tablet Take 1 tablet (200 mg total) by mouth 2 (two) times daily. Qty: 60 tablet, Refills: 0    diltiazem (CARDIZEM CD) 240 MG 24 hr capsule Take 1 capsule (240 mg total) by mouth daily. Qty: 40 capsule, Refills: 0    pantoprazole (PROTONIX) 40 MG tablet Take 1 tablet (40 mg total) by mouth 2 (two) times daily before a meal. Qty: 60 tablet, Refills: 0      CONTINUE these medications which have CHANGED   Details  metoprolol (LOPRESSOR) 50 MG tablet Take 1 tablet (50 mg total) by mouth 2 (two) times daily. Qty: 60 tablet, Refills: 0      CONTINUE these medications which have NOT CHANGED   Details  acetaminophen (TYLENOL) 325 MG tablet Take 650 mg by mouth every 6 (six) hours as needed.      Calcium Carbonate-Vitamin D (CALTRATE 600+D PO) Take by mouth daily.      dabigatran (PRADAXA) 150 MG CAPS Take 150 mg by mouth every 12 (twelve) hours.      HYDROcodone-acetaminophen (NORCO) 5-325 MG per tablet Take 1 tablet by mouth every 6 (six) hours as needed. For pain     lisinopril (PRINIVIL,ZESTRIL) 40 MG tablet Take 40 mg by mouth daily.        Multiple Vitamin (MULTIVITAMIN) tablet Take 1 tablet by mouth daily.      pravastatin (PRAVACHOL) 40 MG tablet Take 40 mg by mouth daily.      oxyCODONE-acetaminophen (PERCOCET) 5-325 MG per tablet Take 1 tablet by mouth every 4 (four) hours as needed. For pain       STOP taking these medications     aspirin 81 MG tablet      hydrochlorothiazide (HYDRODIURIL) 25 MG tablet          Procedures: Dg Lumbar Spine 2-3 Views  08/05/2011  *RADIOLOGY REPORT*  Clinical Data: Back pain.  LUMBAR SPINE - 2-3 VIEW  Comparison: None.  Findings: Severe compression fractures are noted through the T10, L1, and L3 vertebral bodies, age indeterminate.  Slight compression fracture is likely present in the superior endplate of T12.  There is diffuse osteopenia.  Slight anterolisthesis of L4 on L5 likely related to mild facet disease.  Aorta is calcified, non-aneurysmal. SI joints are symmetric and unremarkable.  IMPRESSION: Severe T10, L1 and L3 compression fractures, age indeterminate. Suspect slight compression through the superior endplate of T12.  Original Report Authenticated By: Cyndie Chime, M.D.   Dg Abd 1 View  08/03/2011  *RADIOLOGY REPORT*  Clinical Data: Rule out free abdominal air  ABDOMEN - 1 VIEW  Comparison: Upright view of the chest same day  Findings: There is a nonspecific nonobstructive bowel gas pattern. Moderate stool and gas noted throughout the colon.  No free  abdominal air is noted on the upright view of the chest.  IMPRESSION: Nonspecific nonobstructive bowel gas pattern.  Moderate stool and gas noted throughout the colon.  Original Report Authenticated By: Natasha Mead, M.D.   Dg Chest Port 1 View  08/03/2011  *RADIOLOGY REPORT*  Clinical Data: Chest pain  PORTABLE CHEST - 1 VIEW  Comparison: 10/03/2005  Findings: Cardiomediastinal silhouette is stable.  Status post median sternotomy again noted.  No acute infiltrate or pleural effusion.  No pulmonary edema.  IMPRESSION: No active  disease.  Status post median sternotomy again noted.  Original Report Authenticated By: Natasha Mead, M.D.   Ir Vertebroplasty Or Sacroplasty  08/08/2011  *RADIOLOGY REPORT*  Clinical Data:  Severe low back pain secondary to compression fracture at L1.  KYPHOPLASTY AT L1  Comparison:  MRI scan of the lumbosacral spine of a few days ago from outside institution.  Following a full explanation of the procedure along with the potential associated complications, an informed witnessed consent was obtained.  The patient was placed prone on the fluoroscopic table.  The skin overlying the lumbar region was then prepped and draped in the usual sterile fashion.  The right pedicle at L1 was then infiltrated with 0.25% bupivacaine, followed by the advancement of an 11-gauge Jamshidi needle into the posterior third at L1.  This was then exchanged out for the Kyphon advanced osteo introducer system comprised of a working cannula and a Kyphon osteo drill over a Kyphon osteo bone pin.  This combination was then advanced until the tip of the Kyphon osteo drill was in the posterior third.  At this time the bone pin was removed.  In a medial trajectory, the combination was advanced until the tip of the working cannula was in the posterior third at L1.  Crossing of the midline by the osteo drill was seen.  The osteo drill was then removed and a core sample sent for pathologic analysis.  Through the working cannula, the Kyphon bone biopsy device was then advanced within 5 mm of the anterior aspect of L1.  A core sample from this was also sent for pathologic analysis.  Through the working cannula, a Kyphon inflatable bone tamp 20 x 3 was advanced and positioned with the distal markers 7 mm from the anterior aspect of L1.  This was then expanded with contrast using the Kyphon inflation syringe device via microtubing until there was apposition with the superior and the inferior endplates.  At this time methylmethacrylate mixture was  reconstituted with Tobramycin in the Kyphon bone mixing system.  This was then loaded onto Kyphon bone fillers.  The balloon was deflated and removed followed by the instillation of 3.5 bone filler equivalents of methylmethacrylate mixture at L1 with excellent filling in the AP and lateral projections.  There was no extrusion noted into disc spaces or posteriorly into the spinal canal.  No paraspinous venous examination was seen.  The working cannula and the bone filler were then removed. Hemostasis was achieved at the skin entry site.  The patient tolerated the procedure well.  There were no acute complications.  Medications Utilized:  Versed 4 mg IV.  Fentanyl 100 mcg IV.  Ancef 1 gram IV.  IMPRESSION: 1. Status post fluoroscopic-guided needle placement for deep core bone biopsy, L1. 2. Status post vertebral body augmentation for painful osteoporotic compression fracture at L1 using the balloon kyphoplasty technique.  Original Report Authenticated By: Oneal Grout, M.D.    Echocardiogram- 08/04/11 Left ventricle: The cavity size  was normal. There was mild focal basal and mild concentric hypertrophy of the septum. Systolic function was normal. The estimated ejection fraction was in the range of 55% to 60%. Regional wall motion abnormalities cannot be excluded. - Aortic valve: Mild regurgitation. - Mitral valve: Calcified annulus. Mild regurgitation. - Left atrium: The atrium was moderately dilated. - Right atrium: The atrium was moderately dilated. - Atrial septum: There was an atrial septal aneurysm. - Pulmonary arteries: Systolic pressure was moderately increased.     Hospital Course: This is an 77 year old female who presented to the hospital with a main complaint of lower back pain. She had undergone an MRI as an outpatient however before she received the results of this procedure her back pain increased and therefore she presented to the hospital for pain control. On admission she  was noted to be in A. fib with RVR and also complained of recently having black, tarry stools.  Peptic ulcer disease with hemorrhage (melena)- The patient's initial complaint was tarry stools for which a GI consult was requested and Pradoxa was held. She was evaluated by Dr. Matthias Hughs who performed an upper endoscopy on 08/04/2011. ENDOSCOPIC IMPRESSION:  1. No active bleeding or blood in the stomach at the time of this  examination.  2. Small, clean-based gastric ulcer and 2 small duodenal erosions.  Also, antral erythema. All of these findings are consistent with  the patient's known aspirin exposure.  3. It is felt most likely that the patient's bleeding came from  the gastric ulcer. Of note, the patient had dark stools beginning  approximately one week prior to this procedure, which might  indicate that the bleeding occurred at that time, thus accounting  for the relatively clean appearance of the ulcer at this time, and  specifically, the absence of any stigmata of recent hemorrhage.  4. Schatzki's ring, which is asymptomatic by history.  RECOMMENDATIONS:  1. Based on the favorable endoscopic appearance, it appears that  the patient is at low risk for further bleeding from the upper GI  tract. Accordingly, I feel it is appropriate to advance the  patient's diet and change her to oral PPI therapy.  2. Ideally, the patient would be kept off aspirin and Pradaxa for  the next 5-7 days, although if it is felt critical to start him  sooner, I feel that could be done with a fairly high degree of  safety.  3. I would treat the patient with twice daily PPIs while in house,  but I feel she can be discharged on once daily therapy. I would  keep her on once daily PPI therapy indefinitely to help prophylax  against future ulcers, assuming that she will need lifelong  aspirin therapy, which would place her at risk for ulcer  formation.  4. Based on the favorable endoscopic findings, I feel that   discharge from the GI perspective would be safe in the next couple  of days, assuming there is no further bleeding in the interim.  5. I have made the patient an appointment to see me in the office  about a month from now to check Hemoccult status. If she remains  Hemoccult positive, updated evaluation of the lower GI tract may  be needed. Her son indicates her last colonoscopy was about 2  years ago, and was performed in [place], [name].  6. I would favor repeat endoscopy in 2 months to confirm ulcer  healing. This can be discussed with the patient at her upcoming  office visit with me.  She has had no further melena during her hospital stay. The patient is going to be discharged on twice a day Protonix and is advised to followup with GI in the next 3 weeks. Pradaxa has been resumed.   Anemia associated with acute blood loss She received a transfusion of 2 units of packed red blood cells. Her hemoglobin has been stable and ranging above 10. It was 10.5 when last checked on 08/13/2011.   Hypotension-this was related to acute blood loss and hypovolemic state and has resolved. We have resumed her antihypertensives.   Atrial fibrillation with RVR This was quite problematic during her hospital stay and the patient was noted to go back and forth from sinus rhythm to atrial fibrillation. Heart rate was difficult to control and required an Amiodarone drip and a Cardizem drip. She has now been weaned off and is on by mouth Amiodarone, Cardizem and an increased dose of Metoprolol on discharge. She has been receiving 400 mg of by mouth amiodarone twice a day. Per cardiology recommendations we will taper this down to 200 twice a day and she is advised to followup with her cardiologist, Dr. Elease Hashimoto in 2 weeks. At this time she continues to be in atrial fibrillation with a heart rate in the 60s and 70s.   Hyponatremia- This was suspected to be secondary to hydrochlorothiazide which was held on  admission. Low-volume status may have also contributed. However, sodium has improved but not to a normal baseline and therefore we will continue to hold her hydrochlorothiazide.   Lumbar back pain/ Lumbar compression fracture S/P vertebroplasty on 12/23 This was her presenting complaint and it was noted that she had had an MRI as an outpatient but had not yet followed up for those results. An orthopedic consult was requested and she was evaluated by Dr. Darrelyn Hillock. A lumbar two-view revealed compression fractures at T10, L1 and L3 levels. Dr. Tinnie Gens noted per MRI the L1 compression fracture was acute and therefore this was treated with vertebroplasty performed by interventional radiology on 08/07/2011. The patient's back pain was noted to improve after this procedure and she is now ambulating in the halls with a walker without any significant pain or discomfort. She will continue to have PT at home. She will continue her calcium and vitamin D supplements as well.  HTN (hypertension) CAD (coronary artery disease) Dyslipidemia Chronic kidney disease (CKD), stage III (moderate)      Day of Discharge Physical Exam: BP 112/61  Pulse 84  Temp(Src) 97.9 F (36.6 C) (Oral)  Resp 18  Ht 5\' 4"  (1.626 m)  Wt 70.3 kg (154 lb 15.7 oz)  BMI 26.60 kg/m2  SpO2 94% General appearance: alert, cooperative, appears stated age  Resp: Clear to auscultation bilaterally, respiratory effort is nonlabored nontachypneic, she is on room air  Cardio: Irregular rhythm-atrial fibrillation alternating between sinus rhythm and sinus tachycardia, ventricular rates between 79 and 138, S1, S2 normal, systolic blood pressure is controlled  GI: soft, non-tender; bowel sounds normal; no masses, no organomegaly, no bowel movement since admission, large bowel movement x2 yesterday evening  Extremities: extremities normal, atraumatic, no cyanosis. Resolved pitting edema.  Neurologic: Grossly normal     Disposition:  stable  Follow-up Appts: Discharge Orders    Future Orders Please Complete By Expires   Diet - low sodium heart healthy      Increase activity slowly         Follow-up with: Dr.Nahser in 2 wks.  Dr  Buccini in 3-4 wks.  Dr Everlene Other in 1 week.  Time on Discharge: 60 min  Signed: Michaeal Davis 08/14/2011, 11:01 AM

## 2011-08-14 NOTE — Progress Notes (Signed)
CARE MANAGEMENT NOTE 08/14/2011  Patient:  Donna Ortega, Donna Ortega   Account Number:  [id number]  Date Initiated:  08/04/2011  Documentation initiated by:  CLARK,JENNIFER  Subjective/Objective Assessment:   PT WAS ADMITTED WITH A FIB W/ RVR     Action/Plan:   PROGRESSION OF CARE AND DISCHARGE PLANNING   Anticipated DC Date:  08/08/2011   Anticipated DC Plan:  HOME/SELF CARE      DC Planning Services  CM consult      [facility] Choice  HOME HEALTH   Choice offered to / List presented to:  C-1 Patient   DME arranged  HOSPITAL BED      DME agency  [facility]     HH arranged  HH-2 PT      HH agency  [facility]   Status of service:  Completed, signed off Medicare Important Message given?   (If response is "NO", the following Medicare IM given date fields will be blank) Date Medicare IM given:   Date Additional Medicare IM given:    Discharge Disposition:  HOME W HOME HEALTH SERVICES  Per UR Regulation:  Reviewed for med. necessity/level of care/duration of stay  Comments:  08/14/2011 1130 Spoke to pt and she is agreeable to [facility] with [facility]. Faxed orders, d/c summary and facesheet to [facility]. Faxed orders to [facility] for hospital bed. Contacted [facility]. They will deliver to pt's home this evening. Provided pt with number for [facility]. Isidoro Donning RN CCM Case Mgmt phone [phone]

## 2011-08-14 NOTE — Progress Notes (Signed)
Pt ambulated in hallway 500 feet with rolling walker and assist x1. Pt tolerated well.  Alfonso Ellis, RN 08/14/2011 6:21 AM

## 2011-08-15 NOTE — Progress Notes (Signed)
   CARE MANAGEMENT NOTE 08/15/2011  Patient:  Donna Ortega, Donna Ortega   Account Number:  [id number]  Date Initiated:  08/04/2011  Documentation initiated by:  Melissaann Dizdarevic  Subjective/Objective Assessment:   PT WAS ADMITTED WITH A FIB W/ RVR     Action/Plan:   PROGRESSION OF CARE AND DISCHARGE PLANNING   Anticipated DC Date:  08/08/2011   Anticipated DC Plan:  HOME/SELF CARE      DC Planning Services  CM consult      [facility] Choice  HOME HEALTH   Choice offered to / List presented to:  C-1 Patient   DME arranged  HOSPITAL BED      DME agency  [facility]     HH arranged  HH-2 PT      HH agency  [facility]   Status of service:  Completed, signed off Medicare Important Message given?   (If response is "NO", the following Medicare IM given date fields will be blank) Date Medicare IM given:   Date Additional Medicare IM given:    Discharge Disposition:  HOME W HOME HEALTH SERVICES  Per UR Regulation:  Reviewed for med. necessity/level of care/duration of stay  Comments:  08/14/2011 1130 Spoke to pt and she is agreeable to [facility] with [facility]. Faxed orders, d/c summary and facesheet to [facility]. Faxed orders to [facility] for hospital bed. Contacted [facility]. They will deliver to pt's home this evening. Provided pt with number for [facility]. Isidoro Donning RN CCM Case Mgmt phone [phone]  UR COMPLETED 08/04/2011 Onnie Boer, RN, BSN 1432 PT WAS ADMITTED WITH A FIB W/ RVR, PTA PT WAS FROM HOME WITH SELF CARE  08/12/2011 Onnie Boer, RN, BSN 1633 PT WILL NEED HH PT AND ROLLING WALKER AT DC. FAMILY FEELS THAT SHE MAY NEED A BED ALSO.   SPOKE WITH SON  DOUGALS Molock ( SON) TODAY AT [phone] OFFERING CHOICE, HE WAS UNDECIDED SO I WILL LEAVE A LIST FOR THE PT. IF PT IS TO DC OVER THE WEEKEND  PT HAS MEDICARE [employer] ALSO.  WILL F/U.

## 2011-08-20 ENCOUNTER — Emergency Department (INDEPENDENT_AMBULATORY_CARE_PROVIDER_SITE_OTHER): Payer: Medicare Other

## 2011-08-20 ENCOUNTER — Encounter (HOSPITAL_BASED_OUTPATIENT_CLINIC_OR_DEPARTMENT_OTHER): Payer: Self-pay | Admitting: *Deleted

## 2011-08-20 ENCOUNTER — Inpatient Hospital Stay (HOSPITAL_BASED_OUTPATIENT_CLINIC_OR_DEPARTMENT_OTHER)
Admission: EM | Admit: 2011-08-20 | Discharge: 2011-08-25 | DRG: 640 | Disposition: A | Discharge status: Home / Self Care | Payer: Medicare Other | Attending: Internal Medicine | Admitting: Internal Medicine

## 2011-08-20 ENCOUNTER — Other Ambulatory Visit: Payer: Self-pay

## 2011-08-20 DIAGNOSIS — Z9889 Other specified postprocedural states: Secondary | ICD-10-CM

## 2011-08-20 DIAGNOSIS — R5383 Other fatigue: Secondary | ICD-10-CM | POA: Diagnosis present

## 2011-08-20 DIAGNOSIS — M545 Low back pain, unspecified: Secondary | ICD-10-CM

## 2011-08-20 DIAGNOSIS — I4891 Unspecified atrial fibrillation: Secondary | ICD-10-CM | POA: Diagnosis present

## 2011-08-20 DIAGNOSIS — I509 Heart failure, unspecified: Secondary | ICD-10-CM | POA: Diagnosis not present

## 2011-08-20 DIAGNOSIS — Z23 Encounter for immunization: Secondary | ICD-10-CM

## 2011-08-20 DIAGNOSIS — I517 Cardiomegaly: Secondary | ICD-10-CM

## 2011-08-20 DIAGNOSIS — E785 Hyperlipidemia, unspecified: Secondary | ICD-10-CM | POA: Diagnosis present

## 2011-08-20 DIAGNOSIS — I289 Disease of pulmonary vessels, unspecified: Secondary | ICD-10-CM

## 2011-08-20 DIAGNOSIS — J90 Pleural effusion, not elsewhere classified: Secondary | ICD-10-CM

## 2011-08-20 DIAGNOSIS — Z9861 Coronary angioplasty status: Secondary | ICD-10-CM

## 2011-08-20 DIAGNOSIS — Z79899 Other long term (current) drug therapy: Secondary | ICD-10-CM

## 2011-08-20 DIAGNOSIS — K279 Peptic ulcer, site unspecified, unspecified as acute or chronic, without hemorrhage or perforation: Secondary | ICD-10-CM | POA: Diagnosis present

## 2011-08-20 DIAGNOSIS — E8779 Other fluid overload: Secondary | ICD-10-CM | POA: Diagnosis present

## 2011-08-20 DIAGNOSIS — J811 Chronic pulmonary edema: Secondary | ICD-10-CM | POA: Diagnosis present

## 2011-08-20 DIAGNOSIS — N183 Chronic kidney disease, stage 3 unspecified: Secondary | ICD-10-CM

## 2011-08-20 DIAGNOSIS — I10 Essential (primary) hypertension: Secondary | ICD-10-CM | POA: Diagnosis present

## 2011-08-20 DIAGNOSIS — I5030 Unspecified diastolic (congestive) heart failure: Secondary | ICD-10-CM | POA: Diagnosis not present

## 2011-08-20 DIAGNOSIS — K921 Melena: Secondary | ICD-10-CM

## 2011-08-20 DIAGNOSIS — S32000A Wedge compression fracture of unspecified lumbar vertebra, initial encounter for closed fracture: Secondary | ICD-10-CM

## 2011-08-20 DIAGNOSIS — E871 Hypo-osmolality and hyponatremia: Principal | ICD-10-CM | POA: Diagnosis present

## 2011-08-20 DIAGNOSIS — R197 Diarrhea, unspecified: Secondary | ICD-10-CM | POA: Diagnosis present

## 2011-08-20 DIAGNOSIS — R410 Disorientation, unspecified: Secondary | ICD-10-CM | POA: Diagnosis present

## 2011-08-20 DIAGNOSIS — N390 Urinary tract infection, site not specified: Secondary | ICD-10-CM | POA: Diagnosis present

## 2011-08-20 DIAGNOSIS — G9340 Encephalopathy, unspecified: Secondary | ICD-10-CM | POA: Diagnosis present

## 2011-08-20 DIAGNOSIS — I2510 Atherosclerotic heart disease of native coronary artery without angina pectoris: Secondary | ICD-10-CM | POA: Diagnosis present

## 2011-08-20 DIAGNOSIS — D62 Acute posthemorrhagic anemia: Secondary | ICD-10-CM | POA: Diagnosis present

## 2011-08-20 DIAGNOSIS — R5381 Other malaise: Secondary | ICD-10-CM

## 2011-08-20 DIAGNOSIS — I482 Chronic atrial fibrillation, unspecified: Secondary | ICD-10-CM | POA: Diagnosis present

## 2011-08-20 DIAGNOSIS — K922 Gastrointestinal hemorrhage, unspecified: Secondary | ICD-10-CM

## 2011-08-20 LAB — COMPREHENSIVE METABOLIC PANEL
Albumin: 3.8 g/dL (ref 3.5–5.2)
BUN: 15 mg/dL (ref 6–23)
Creatinine, Ser: 0.6 mg/dL (ref 0.50–1.10)
Total Bilirubin: 0.3 mg/dL (ref 0.3–1.2)
Total Protein: 6.6 g/dL (ref 6.0–8.3)

## 2011-08-20 LAB — DIFFERENTIAL
Basophils Relative: 0 % (ref 0–1)
Eosinophils Absolute: 0.1 10*3/uL (ref 0.0–0.7)
Eosinophils Relative: 1 % (ref 0–5)
Monocytes Absolute: 0.7 10*3/uL (ref 0.1–1.0)
Monocytes Relative: 9 % (ref 3–12)

## 2011-08-20 LAB — URINE MICROSCOPIC-ADD ON

## 2011-08-20 LAB — URINALYSIS, ROUTINE W REFLEX MICROSCOPIC
Bilirubin Urine: NEGATIVE
Nitrite: NEGATIVE
Protein, ur: NEGATIVE mg/dL
Specific Gravity, Urine: 1.025 (ref 1.005–1.030)
Urobilinogen, UA: 0.2 mg/dL (ref 0.0–1.0)

## 2011-08-20 LAB — PROTIME-INR
INR: 1.34 (ref 0.00–1.49)
Prothrombin Time: 16.8 seconds — ABNORMAL HIGH (ref 11.6–15.2)

## 2011-08-20 LAB — CBC
HCT: 31 % — ABNORMAL LOW (ref 36.0–46.0)
Hemoglobin: 10.9 g/dL — ABNORMAL LOW (ref 12.0–15.0)
MCH: 30.1 pg (ref 26.0–34.0)
MCHC: 35.2 g/dL (ref 30.0–36.0)

## 2011-08-20 MED ORDER — SODIUM CHLORIDE 0.9 % IV SOLN
INTRAVENOUS | Status: DC
  Administered 2011-08-20: 21:00:00 via INTRAVENOUS

## 2011-08-20 MED ORDER — SULFAMETHOXAZOLE-TMP DS 800-160 MG PO TABS
1.0000 | ORAL_TABLET | Freq: Once | ORAL | Status: AC
  Administered 2011-08-20: 1 via ORAL
  Filled 2011-08-20: qty 1

## 2011-08-20 NOTE — ED Notes (Signed)
Patient transported to X-ray 

## 2011-08-20 NOTE — ED Provider Notes (Signed)
History     CSN: [id number]  Arrival date & time 08/20/11  1621   First MD Initiated Contact with Patient 08/20/11 1806      Chief Complaint  Patient presents with  . Weakness    (Consider location/radiation/quality/duration/timing/severity/associated sxs/prior treatment) HPI Comments: Pt states that she was recently hospitalized for a gi bleed:pt states that she was feeling really good when she left and she has progressively gotten worse:pt denies rectal bleeding or diarrhea:pt states that she is just tired and is generally weak:pt states that she has some back pain but she had a fusion when she was in the hospital and that has been persistent:pts was started back on the pradaxa before she left the hospital pt was placed on amiodarone while she was inpt as she was having problem with atrial fib again  Patient is a 78 y.o. female presenting with weakness. The history is provided by the patient and a relative. No language interpreter was used.  Weakness The primary symptoms include nausea. Primary symptoms do not include dizziness, focal weakness, loss of sensation, speech change, fever or vomiting. The symptoms began 2 days ago. The symptoms are unchanged. The neurological symptoms are diffuse.  Additional symptoms include weakness. Additional symptoms do not include anxiety or irritability.    Past Medical History  Diagnosis Date  . Coronary artery disease 2007    3 vessel CABG with LIMA-LAD, SVG to diag, and SVG - Ramus  . Hyperlipidemia   . Hypertension   . Campath-induced atrial fibrillation     paroxysmal  . History of hyperkalemia   . Hyponatremia   . Diverticulitis     Past Surgical History  Procedure Date  . Maze   . Coronary artery bypass graft   . Partial hysterectomy   . Esophagogastroduodenoscopy 08/04/2011    Procedure: ESOPHAGOGASTRODUODENOSCOPY (EGD);  Surgeon: Florencia Reasons, MD;  Location: [facility] ENDOSCOPY;  Service: Endoscopy;  Laterality: N/A;  do at bedside      Family History  Problem Relation Age of Onset  . Heart failure Father   . Kidney failure Father   . Bone cancer Brother   . Kidney failure Sister   . Colon cancer Sister   . Cancer Sister     History  Substance Use Topics  . Smoking status: Never Smoker   . Smokeless tobacco: Not on file  . Alcohol Use: No    OB History    Grav Para Term Preterm Abortions TAB SAB Ect Mult Living                  Review of Systems  Constitutional: Negative for fever and irritability.  Gastrointestinal: Positive for nausea. Negative for vomiting.  Neurological: Positive for weakness. Negative for dizziness, speech change and focal weakness.  All other systems reviewed and are negative.    Allergies  Lipitor and Niaspan  Home Medications   Current Outpatient Rx  Name Route Sig Dispense Refill  . ACETAMINOPHEN 325 MG PO TABS Oral Take 650 mg by mouth every 6 (six) hours as needed.      . AMIODARONE HCL 400 MG PO TABS Oral Take 0.5 tablets (200 mg total) by mouth 2 (two) times daily. 60 tablet 0  . CALTRATE 600+D PO Oral Take 1 tablet by mouth daily.     Marland Kitchen DABIGATRAN ETEXILATE MESYLATE 150 MG PO CAPS Oral Take 150 mg by mouth every 12 (twelve) hours.      Marland Kitchen DILTIAZEM HCL ER COATED BEADS 240 MG  PO CP24 Oral Take 1 capsule (240 mg total) by mouth daily. 40 capsule 0  . LISINOPRIL 40 MG PO TABS Oral Take 40 mg by mouth daily.      Marland Kitchen METOPROLOL TARTRATE 50 MG PO TABS Oral Take 1 tablet (50 mg total) by mouth 2 (two) times daily. 60 tablet 0  . ONE-DAILY MULTI VITAMINS PO TABS Oral Take 1 tablet by mouth daily.      Marland Kitchen OMEPRAZOLE MAGNESIUM 20 MG PO TBEC Oral Take 40 mg by mouth 2 (two) times daily.      Marland Kitchen PRAVASTATIN SODIUM 40 MG PO TABS Oral Take 40 mg by mouth daily.        BP 171/68  Pulse 58  Temp(Src) 98.2 F (36.8 C) (Oral)  Resp 20  Ht 5\' 4"  (1.626 m)  Wt 164 lb (74.39 kg)  BMI 28.15 kg/m2  SpO2 98%  Physical Exam  Nursing note and vitals reviewed. Constitutional: She  is oriented to person, place, and time. She appears well-developed and well-nourished.  HENT:  Head: Normocephalic and atraumatic.  Eyes: Conjunctivae and EOM are normal.  Neck: Neck supple.  Cardiovascular: Normal rate and regular rhythm.   Pulmonary/Chest: Effort normal and breath sounds normal.  Abdominal: Soft. Bowel sounds are normal.  Genitourinary: Guaiac negative stool.  Musculoskeletal: Normal range of motion.  Neurological: She is oriented to person, place, and time.  Skin: Skin is warm and dry.  Psychiatric: She has a normal mood and affect.    ED Course  Procedures (including critical care time)  Labs Reviewed  CBC - Abnormal; Notable for the following:    RBC 3.62 (*)    Hemoglobin 10.9 (*)    HCT 31.0 (*)    All other components within normal limits  DIFFERENTIAL - Abnormal; Notable for the following:    Neutrophils Relative 78 (*)    All other components within normal limits  COMPREHENSIVE METABOLIC PANEL - Abnormal; Notable for the following:    Sodium 120 (*)    Chloride 87 (*)    Glucose, Bld 102 (*)    GFR calc non Af Amer 82 (*)    All other components within normal limits  PROTIME-INR - Abnormal; Notable for the following:    Prothrombin Time 16.8 (*)    All other components within normal limits  OCCULT BLOOD X 1 CARD TO LAB, STOOL  URINALYSIS, ROUTINE W REFLEX MICROSCOPIC    Date: 08/20/2011  Rate: 62  Rhythm: sinus with premature ventricular complexes  QRS Axis: normal  Intervals: normal  ST/T Wave abnormalities: nonspecific ST changes  Conduction Disutrbances:none  Narrative Interpretation:   Old EKG Reviewed: pt previously in a fib   Dg Chest 2 View  08/20/2011  *RADIOLOGY REPORT*  Clinical Data: 78 year old female with weakness.  Recent L1 kyphoplasty.  CHEST - 2 VIEW  Comparison: 08/03/2011 and prior chest radiographs  Findings: Cardiomegaly is noted with pulmonary vascular congestion. Small bilateral pleural effusions, right greater than  left, noted. There is no evidence of airspace disease, pneumothorax or definite edema. T10 and L3 compression fractures are again identified. L1 compression fracture and augmentation changes are present.  IMPRESSION: Cardiomegaly with pulmonary vascular congestion and small bilateral pleural effusions.  L1 compression fracture and augmentation changes.  Original Report Authenticated By: Rosendo Gros, M.D.     1. UTI (lower urinary tract infection)   2. Hyponatremia       MDM  Pt to be admitted from [facility] for hyponatremia  and uti        Teressa Lower, NP 08/20/11 2101

## 2011-08-20 NOTE — ED Notes (Signed)
Family member states that pt was discharged from [facility] last Sun (GI bleed). Had endoscopy which showed ulcer. Pradaxa stopped and restarted. S/S of ? GI bleed returned after starting Pradaxa back. No energy. Decreased appetite.

## 2011-08-20 NOTE — ED Provider Notes (Signed)
Medical screening examination/treatment/procedure(s) were performed by non-physician practitioner and as supervising physician I was immediately available for consultation/collaboration.    Amariss Detamore L Antwyne Pingree, MD 08/20/11 2313 

## 2011-08-21 ENCOUNTER — Encounter (HOSPITAL_COMMUNITY): Payer: Self-pay | Admitting: Internal Medicine

## 2011-08-21 DIAGNOSIS — N390 Urinary tract infection, site not specified: Secondary | ICD-10-CM | POA: Diagnosis present

## 2011-08-21 DIAGNOSIS — R5383 Other fatigue: Secondary | ICD-10-CM | POA: Diagnosis present

## 2011-08-21 DIAGNOSIS — R410 Disorientation, unspecified: Secondary | ICD-10-CM | POA: Diagnosis present

## 2011-08-21 LAB — CARDIAC PANEL(CRET KIN+CKTOT+MB+TROPI)
CK, MB: 2.6 ng/mL (ref 0.3–4.0)
CK, MB: 2.5 ng/mL (ref 0.3–4.0)
Relative Index: INVALID (ref 0.0–2.5)
Total CK: 73 U/L (ref 7–177)
Troponin I: <0.3 ng/mL (ref <0.30)
Troponin I: <0.3 ng/mL (ref <0.30)
Troponin I: 0.33 ng/mL (ref <0.30)

## 2011-08-21 LAB — BASIC METABOLIC PANEL
BUN: 12 mg/dL (ref 6–23)
Calcium: 8.3 mg/dL — ABNORMAL LOW (ref 8.4–10.5)
Creatinine, Ser: 0.62 mg/dL (ref 0.50–1.10)
GFR calc Af Amer: >90 mL/min (ref >90)
GFR calc non Af Amer: 81 mL/min — ABNORMAL LOW (ref >90)
Potassium: 3.6 mEq/L (ref 3.5–5.1)

## 2011-08-21 LAB — COMPREHENSIVE METABOLIC PANEL
ALT: 17 U/L (ref 0–35)
Alkaline Phosphatase: 75 U/L (ref 39–117)
BUN: 11 mg/dL (ref 6–23)
CO2: 19 mEq/L (ref 19–32)
GFR calc Af Amer: >90 mL/min (ref >90)
GFR calc non Af Amer: 79 mL/min — ABNORMAL LOW (ref >90)
Glucose, Bld: 87 mg/dL (ref 70–99)
Potassium: 4.3 mEq/L (ref 3.5–5.1)
Sodium: 122 mEq/L — ABNORMAL LOW (ref 135–145)
Total Bilirubin: 0.2 mg/dL — ABNORMAL LOW (ref 0.3–1.2)
Total Protein: 6.1 g/dL (ref 6.0–8.3)

## 2011-08-21 LAB — CBC
HCT: 30.9 % — ABNORMAL LOW (ref 36.0–46.0)
Hemoglobin: 10.7 g/dL — ABNORMAL LOW (ref 12.0–15.0)
MCH: 30 pg (ref 26.0–34.0)
MCHC: 34.6 g/dL (ref 30.0–36.0)
RBC: 3.57 MIL/uL — ABNORMAL LOW (ref 3.87–5.11)

## 2011-08-21 LAB — MAGNESIUM: Magnesium: 1.9 mg/dL (ref 1.5–2.5)

## 2011-08-21 LAB — GLUCOSE, CAPILLARY
Glucose-Capillary: 113 mg/dL — ABNORMAL HIGH (ref 70–99)
Glucose-Capillary: 101 mg/dL — ABNORMAL HIGH (ref 70–99)

## 2011-08-21 LAB — URINE CULTURE
Colony Count: 55000
Culture  Setup Time: 201301060220

## 2011-08-21 MED ORDER — ACETAMINOPHEN 325 MG PO TABS: 650.0000 mg | ORAL_TABLET | Freq: Four times a day (QID) | ORAL | Status: DC | PRN

## 2011-08-21 MED ORDER — GUAIFENESIN-DM 100-10 MG/5ML PO SYRP: 5.0000 mL | ORAL_SOLUTION | ORAL | Status: DC | PRN

## 2011-08-21 MED ORDER — SODIUM CHLORIDE 0.9 % IV SOLN: 250.0000 mL | INTRAVENOUS | Status: DC | PRN

## 2011-08-21 MED ORDER — AMIODARONE HCL 200 MG PO TABS
200.0000 mg | ORAL_TABLET | Freq: Two times a day (BID) | ORAL | Status: DC
  Administered 2011-08-21 – 2011-08-25 (×10): 200 mg via ORAL
  Filled 2011-08-21 (×12): qty 1

## 2011-08-21 MED ORDER — SODIUM CHLORIDE 0.9 % IJ SOLN: 3.0000 mL | INTRAMUSCULAR | Status: DC | PRN

## 2011-08-21 MED ORDER — ONDANSETRON HCL 4 MG/2ML IJ SOLN: 4.0000 mg | Freq: Four times a day (QID) | INTRAMUSCULAR | Status: DC | PRN

## 2011-08-21 MED ORDER — ZOLPIDEM TARTRATE 5 MG PO TABS
5.0000 mg | ORAL_TABLET | Freq: Every evening | ORAL | Status: DC | PRN
  Administered 2011-08-21 – 2011-08-24 (×5): 5 mg via ORAL
  Filled 2011-08-21 (×5): qty 1

## 2011-08-21 MED ORDER — ALUM & MAG HYDROXIDE-SIMETH 200-200-20 MG/5ML PO SUSP: 30.0000 mL | Freq: Four times a day (QID) | ORAL | Status: DC | PRN

## 2011-08-21 MED ORDER — ROSUVASTATIN CALCIUM 5 MG PO TABS
5.0000 mg | ORAL_TABLET | Freq: Every day | ORAL | Status: DC
  Administered 2011-08-21 – 2011-08-24 (×4): 5 mg via ORAL
  Filled 2011-08-21 (×5): qty 1

## 2011-08-21 MED ORDER — ACETAMINOPHEN 325 MG PO TABS
650.0000 mg | ORAL_TABLET | Freq: Four times a day (QID) | ORAL | Status: DC | PRN
  Administered 2011-08-21 – 2011-08-25 (×6): 650 mg via ORAL
  Filled 2011-08-21 (×6): qty 2

## 2011-08-21 MED ORDER — ONDANSETRON HCL 4 MG PO TABS: 4.0000 mg | ORAL_TABLET | Freq: Four times a day (QID) | ORAL | Status: DC | PRN

## 2011-08-21 MED ORDER — PANTOPRAZOLE SODIUM 40 MG PO TBEC
80.0000 mg | DELAYED_RELEASE_TABLET | Freq: Two times a day (BID) | ORAL | Status: DC
  Administered 2011-08-21 – 2011-08-25 (×9): 80 mg via ORAL
  Filled 2011-08-21 (×8): qty 2

## 2011-08-21 MED ORDER — DABIGATRAN ETEXILATE MESYLATE 150 MG PO CAPS
150.0000 mg | ORAL_CAPSULE | Freq: Two times a day (BID) | ORAL | Status: DC
  Administered 2011-08-21 – 2011-08-25 (×9): 150 mg via ORAL
  Filled 2011-08-21 (×10): qty 1

## 2011-08-21 MED ORDER — SIMVASTATIN 20 MG PO TABS
20.0000 mg | ORAL_TABLET | Freq: Every day | ORAL | Status: DC
  Filled 2011-08-21: qty 1

## 2011-08-21 MED ORDER — OMEPRAZOLE MAGNESIUM 20 MG PO TBEC
40.0000 mg | DELAYED_RELEASE_TABLET | Freq: Two times a day (BID) | ORAL | Status: DC
  Filled 2011-08-21: qty 2

## 2011-08-21 MED ORDER — SODIUM CHLORIDE 0.9 % IJ SOLN
3.0000 mL | Freq: Two times a day (BID) | INTRAMUSCULAR | Status: DC
  Administered 2011-08-21 – 2011-08-24 (×9): 3 mL via INTRAVENOUS

## 2011-08-21 MED ORDER — DILTIAZEM HCL ER COATED BEADS 240 MG PO CP24
240.0000 mg | ORAL_CAPSULE | Freq: Every day | ORAL | Status: DC
  Administered 2011-08-21 – 2011-08-25 (×5): 240 mg via ORAL
  Filled 2011-08-21 (×5): qty 1

## 2011-08-21 MED ORDER — DEXTROSE 5 % IV SOLN
1.0000 g | INTRAVENOUS | Status: DC
  Administered 2011-08-21 – 2011-08-22 (×2): 1 g via INTRAVENOUS
  Filled 2011-08-21 (×2): qty 10

## 2011-08-21 MED ORDER — ACETAMINOPHEN 650 MG RE SUPP: 650.0000 mg | Freq: Four times a day (QID) | RECTAL | Status: DC | PRN

## 2011-08-21 MED ORDER — OFF THE BEAT BOOK
Freq: Once | Status: DC
  Administered 2011-08-21: 09:00:00
  Filled 2011-08-21: qty 1

## 2011-08-21 MED ORDER — ALBUTEROL SULFATE (5 MG/ML) 0.5% IN NEBU: 2.5000 mg | INHALATION_SOLUTION | RESPIRATORY_TRACT | Status: DC | PRN

## 2011-08-21 MED ORDER — LISINOPRIL 40 MG PO TABS
40.0000 mg | ORAL_TABLET | Freq: Every day | ORAL | Status: DC
  Administered 2011-08-21 – 2011-08-25 (×5): 40 mg via ORAL
  Filled 2011-08-21 (×5): qty 1

## 2011-08-21 MED ORDER — METOPROLOL TARTRATE 50 MG PO TABS
50.0000 mg | ORAL_TABLET | Freq: Two times a day (BID) | ORAL | Status: DC
  Administered 2011-08-21 – 2011-08-25 (×10): 50 mg via ORAL
  Filled 2011-08-21 (×11): qty 1

## 2011-08-21 MED ORDER — FUROSEMIDE 10 MG/ML IJ SOLN
20.0000 mg | Freq: Once | INTRAMUSCULAR | Status: AC
  Administered 2011-08-21: 20 mg via INTRAVENOUS
  Filled 2011-08-21: qty 2

## 2011-08-21 NOTE — Progress Notes (Signed)
Lab called about panic value. Troponin 0.33. DR Jomarie Longs, Dr Sharl Ma and DR Gerri Lins notified.

## 2011-08-21 NOTE — H&P (Signed)
PCP:  [facility]  Aura Dials, MD, MD   Chief Complaint:   Fatigue worsening confusion   HPI: Donna Ortega is a 78 y.o. female   has a past medical history of Coronary artery disease (2007); Hyperlipidemia; Hypertension; Campath-induced atrial fibrillation; History of hyperkalemia; Hyponatremia; and Diverticulitis.   Presented with  For the past 3 day she has been more confused and fatigued than ussual. Of note patient was just recently discharged from the [facility] with peptic ulcer disease resulting in Gi bleed and anemia. She was here from 12/19-12/30. Since she went home at first she was doing much better. Was able to ambulate and take care of herself. For the past 3 days started to feel poorly. Sleeps all day. When she is awake at night she is getting confused that is not usual for her.  When she first was discharged she had a lot of peripheral edema that since has improved but still persists. She tried to drink less water in hopes to improve her Na. She has chronic history of hyponatremia with Na at 126 on discharge but now its os down to 120.  Recently she have had some worsening shortness of breath and increase in clear mucus production.  No chest pains no fever.  She was transferred from [facility] to [facility] for further treatment.   Review of Systems:    Pertinent positives include: shortness of breath at rest. No dyspnea on exertion, Bilateral lower extremity swelling  productive cough of clear mucus  Constitutional:  No weight loss, night sweats, Fevers, chills, fatigue.  HEENT:  No headaches, Difficulty swallowing,Tooth/dental problems,Sore throat,  No sneezing, itching, ear ache, nasal congestion, post nasal drip,  Cardio-vascular:  No chest pain, Orthopnea, PND, anasarca, dizziness, palpitations.no GI:  No heartburn, indigestion, abdominal pain, nausea, vomiting, diarrhea, change in bowel habits, loss of appetite  Resp:  no  excess mucus, No coughing up of blood.No change in color  of mucus.No wheezing.No chest wall deformity  Skin:  no rash or lesions.  GU:  no dysuria, change in color of urine, no urgency or frequency. No flank pain.  Musculoskeletal:  No joint pain or swelling. No decreased range of motion. No back pain.  Psych:  No change in mood or affect. No depression or anxiety. No memory loss.   Otherwise ROS are negative except for above, 10 systems were reviewed  Past Medical History: Past Medical History  Diagnosis Date  . Coronary artery disease 2007    3 vessel CABG with LIMA-LAD, SVG to diag, and SVG - Ramus  . Hyperlipidemia   . Hypertension   . Campath-induced atrial fibrillation     paroxysmal  . History of hyperkalemia   . Hyponatremia   . Diverticulitis    Past Surgical History  Procedure Date  . Maze   . Coronary artery bypass graft   . Partial hysterectomy   . Esophagogastroduodenoscopy 08/04/2011    Procedure: ESOPHAGOGASTRODUODENOSCOPY (EGD);  Surgeon: Florencia Reasons, MD;  Location: [facility] ENDOSCOPY;  Service: Endoscopy;  Laterality: N/A;  do at bedside  . Abdominal hysterectomy      Medications: Prior to Admission medications   Medication Sig Start Date End Date Taking? Authorizing Provider  acetaminophen (TYLENOL) 325 MG tablet Take 650 mg by mouth every 6 (six) hours as needed.     Yes Historical Provider, MD  amiodarone (PACERONE) 400 MG tablet Take 0.5 tablets (200 mg total) by mouth 2 (two) times daily. 08/14/11 08/13/12 Yes Calvert Cantor, MD  Calcium Carbonate-Vitamin D (CALTRATE 600+D PO) Take 1 tablet by mouth daily.    Yes Historical Provider, MD  dabigatran (PRADAXA) 150 MG CAPS Take 150 mg by mouth every 12 (twelve) hours.     Yes Historical Provider, MD  diltiazem (CARDIZEM CD) 240 MG 24 hr capsule Take 1 capsule (240 mg total) by mouth daily. 08/14/11 08/13/12 Yes Calvert Cantor, MD  lisinopril (PRINIVIL,ZESTRIL) 40 MG tablet Take 40 mg by mouth daily.     Yes Historical Provider, MD  metoprolol (LOPRESSOR) 50 MG  tablet Take 1 tablet (50 mg total) by mouth 2 (two) times daily. 08/14/11 08/13/12 Yes Calvert Cantor, MD  Multiple Vitamin (MULTIVITAMIN) tablet Take 1 tablet by mouth daily.     Yes Historical Provider, MD  omeprazole (PRILOSEC OTC) 20 MG tablet Take 40 mg by mouth 2 (two) times daily.     Yes Historical Provider, MD  pravastatin (PRAVACHOL) 40 MG tablet Take 40 mg by mouth daily.     Yes Historical Provider, MD    Allergies:   Allergies  Allergen Reactions  . Lipitor (Atorvastatin Calcium)     MUSCLE ACHES  . Niaspan (Niacin (Antihyperlipidemic)) Rash    Social History:  Ambulatory usually independently, plays golf Lives at home with family Daughter in law Genice Kimberlin [phone]   reports that she has never smoked. She does not have any smokeless tobacco history on file. She reports that she does not drink alcohol or use illicit drugs.   Family History: family history includes Bone cancer in her brother; Cancer in her sister; Colon cancer in her sister; Heart failure in her father; and Kidney failure in her father and sister.    Physical Exam: Patient Vitals for the past 24 hrs:  BP Temp Temp src Pulse Resp SpO2 Height Weight  08/20/11 2300 160/68 mmHg 97.9 F (36.6 C) Oral 66  18  95 % - 64.184 kg (141 lb 8 oz)  08/20/11 2240 132/55 mmHg - - 63  20  97 % - -  08/20/11 2124 155/74 mmHg 98.5 F (36.9 C) Oral 70  14  97 % - -  08/20/11 1658 171/68 mmHg 98.2 F (36.8 C) Oral 58  20  98 % 5\' 4"  (1.626 m) 74.39 kg (164 lb)    1. General:  in No Acute distress 2. Psychological: Alert and Oriented 3. Head/ENT:   Moist Mucous Membranes                          Head Non traumatic, neck supple                          Normal  Dentition 4. SKIN: normal  Skin turgor,  Skin clean Dry and intact no rash 5. Heart: irregular rate and rhythm no Murmur, Rub or gallop 6. Lungs: Clear to auscultation bilaterally, no wheezes or crackles   7. Abdomen: Soft, non-tender, Non distended 8.  Lower extremities: no clubbing, cyanosis, 2+ pedal edema 9. Neurologically Grossly intact, moving all 4 extremities equally 10. MSK: Normal range of motion  body mass index is 24.29 kg/(m^2).   Labs on Admission:   [facility] 08/20/11 1910  NA 120*  K 3.9  CL 87*  CO2 21  GLUCOSE 102*  BUN 15  CREATININE 0.60  CALCIUM 8.8  MG --  PHOS --    Basename 08/20/11 1910  AST 15  ALT 20  ALKPHOS 86  BILITOT 0.3  PROT 6.6  ALBUMIN 3.8   No results found for this basename: LIPASE:2,AMYLASE:2 in the last 72 hours  Basename 08/20/11 1910  WBC 8.6  NEUTROABS 6.6  HGB 10.9*  HCT 31.0*  MCV 85.6  PLT 322   No results found for this basename: CKTOTAL:3,CKMB:3,CKMBINDEX:3,TROPONINI:3 in the last 72 hours No results found for this basename: TSH,T4TOTAL,FREET3,T3FREE,THYROIDAB in the last 72 hours No results found for this basename: VITAMINB12:2,FOLATE:2,FERRITIN:2,TIBC:2,IRON:2,RETICCTPCT:2 in the last 72 hours No results found for this basename: HGBA1C    Estimated Creatinine Clearance: 46 ml/min (by C-G formula based on Cr of 0.6). ABG    Component Value Date/Time   TCO2 22 08/03/2011 1541     No results found for this basename: DDIMER     Other results:  I have pearsonaly reviewed this: ECG REPORT  Rate: 62  Rhythm:A. Fib vs. SR with 1st degree heart block  ST&T Change: no ischemic changes  UA WBC 7-10, many bacteria   Cultures: No results found for this basename: sdes, specrequest, cult, reptstatus       Radiological Exams on Admission: Dg Chest 2 View  08/20/2011  *RADIOLOGY REPORT*  Clinical Data: 78 year old female with weakness.  Recent L1 kyphoplasty.  CHEST - 2 VIEW  Comparison: 08/03/2011 and prior chest radiographs  Findings: Cardiomegaly is noted with pulmonary vascular congestion. Small bilateral pleural effusions, right greater than left, noted. There is no evidence of airspace disease, pneumothorax or definite edema. T10 and L3 compression  fractures are again identified. L1 compression fracture and augmentation changes are present.  IMPRESSION: Cardiomegaly with pulmonary vascular congestion and small bilateral pleural effusions.  L1 compression fracture and augmentation changes.  Original Report Authenticated By: Rosendo Gros, M.D.    Assessment/Plan  Present on Admission:  UTI - New since discharge probably explains some of the mental status changes. Will obtain urine culture and treat with rocephin .Anemia associated with acute blood loss - currently stable Hemoccult neg .Atrial fibrillation  - On metoprolol, amiodarone and diltiazem, rate well controlled would give holding parameters to avoid bradycardia .CAD (coronary artery disease) - Given worsening sob would cycle cardiac enzymes, repeat ECG in am .HTN (hypertension) - cont home meds AMS - likely secondary to UTI vs hyponatremia, if no improvement with treatment this could be further investigated but no localized neurological deficits at this point.  Hyponatremia - appears to be chronic, her fluid status probably is hypervolemic given peripheral edema and slight venous congestion on CXR. Would hold off on over aggresive hydration. Obtain Urine lytes. Orthostatics if able.  Consider fluid restriction if no evidence of dehydration.    Prophylaxis: SCD Protonix  CODE STATUS: DNR/DNI   Sears Oran 08/21/2011, 12:38 AM

## 2011-08-21 NOTE — Progress Notes (Signed)
2:34 AM  pradaxa per md.   cardiology note 12/21 refers to holding pradaxa x1 week due to gastric ulcer --was not bleeding as of 12/21.  Pt's hemoglobin is stable and hemo-occult is negative at this time. MD has continued Pradaxa. Will monitor CBC's  And clinical progress.   Janice Coffin

## 2011-08-21 NOTE — Progress Notes (Signed)
Assesment: Hyponatremia Fluid overload/ pulmonary edema BNP is elevated to 2193 Will give one dose of lasix 20 mg IV x1 Mild elevation of Troponin to 0.33, sec to pulmonary edema   Pt admitted with hyponatremia, on fluid restriction. Also has diarrhea. Will check  Stool for C diff.

## 2011-08-22 LAB — BASIC METABOLIC PANEL
BUN: 8 mg/dL (ref 6–23)
CO2: 23 mEq/L (ref 19–32)
Calcium: 8.8 mg/dL (ref 8.4–10.5)
Chloride: 84 mEq/L — ABNORMAL LOW (ref 96–112)
Chloride: 85 mEq/L — ABNORMAL LOW (ref 96–112)
GFR calc Af Amer: >90 mL/min (ref >90)
GFR calc non Af Amer: 78 mL/min — ABNORMAL LOW (ref >90)
Glucose, Bld: 93 mg/dL (ref 70–99)
Potassium: 3.3 mEq/L — ABNORMAL LOW (ref 3.5–5.1)
Potassium: 3.7 mEq/L (ref 3.5–5.1)
Sodium: 118 mEq/L — CL (ref 135–145)
Sodium: 120 mEq/L — ABNORMAL LOW (ref 135–145)

## 2011-08-22 LAB — TSH: TSH: 1.349 u[IU]/mL (ref 0.350–4.500)

## 2011-08-22 LAB — CLOSTRIDIUM DIFFICILE BY PCR: Toxigenic C. Difficile by PCR: NEGATIVE

## 2011-08-22 MED ORDER — FUROSEMIDE 10 MG/ML IJ SOLN
20.0000 mg | Freq: Two times a day (BID) | INTRAMUSCULAR | Status: AC
  Administered 2011-08-22 – 2011-08-23 (×2): 20 mg via INTRAVENOUS
  Filled 2011-08-22 (×4): qty 2

## 2011-08-22 MED ORDER — POTASSIUM CHLORIDE CRYS ER 20 MEQ PO TBCR
20.0000 meq | EXTENDED_RELEASE_TABLET | Freq: Two times a day (BID) | ORAL | Status: DC
  Administered 2011-08-22 – 2011-08-23 (×3): 20 meq via ORAL
  Filled 2011-08-22 (×5): qty 1

## 2011-08-22 MED ORDER — FUROSEMIDE 20 MG PO TABS
20.0000 mg | ORAL_TABLET | Freq: Two times a day (BID) | ORAL | Status: DC
  Filled 2011-08-22 (×2): qty 1

## 2011-08-22 MED ORDER — PHENAZOPYRIDINE HCL 200 MG PO TABS
200.0000 mg | ORAL_TABLET | Freq: Once | ORAL | Status: AC
  Administered 2011-08-22: 200 mg via ORAL
  Filled 2011-08-22: qty 1

## 2011-08-22 NOTE — Progress Notes (Signed)
   CARE MANAGEMENT NOTE 08/22/2011  Patient:  Donna Ortega, Donna Ortega   Account Number:  [id number]  Date Initiated:  08/22/2011  Documentation initiated by:  [facility]  Subjective/Objective Assessment:   low sodium, back surgery     Action/Plan:   lives w family, son douglas plans for pt to return home at disch   Anticipated DC Date:  08/24/2011   Anticipated DC Plan:  HOME W HOME HEALTH SERVICES      DC Planning Services  CM consult      [facility] Choice  Resumption Of Svcs/PTA Provider   Choice offered to / List presented to:          [facility] arranged  HH-1 RN  HH-2 PT      [facility] agency  [facility]   Status of service:  In process, will continue to follow Medicare Important Message given?   (If response is "NO", the following Medicare IM given date fields will be blank) Date Medicare IM given:   Date Additional Medicare IM given:    Discharge Disposition:    Per UR Regulation:    Comments:  son Milderd Meager [phone]  pcp dr Tracey Harries  1-7 spoke w son and also w dana needham w [facility] to let them know pt in hosp and room #. debbie Kailer Heindel rn,bsn [phone]  1.02/2012 1000 Spoke to [employer] RN with [employer]. They were following pt for [facility] PT post last d/c and pt's insurance changed. She was set up with [facility] and plans to go home with [place], fax [phone] at d/c. Will need new orders for [facility] at d/c. CM will continue to follow up with pt until d/c. Isidoro Donning RN CCM Case Mgmt phone [phone]

## 2011-08-22 NOTE — Consult Note (Signed)
Reason for Consult: Worsening hyponatremia Referring Physician: [place], [facility]   Donna Ortega is an 78 y.o. female.  HPI: 78 yo F presents with two days of increased confusion, fatigue and generalized malaise. Returns to the hospital after being recently discharged following work up for  GI bleed and L4/S1 vertebroplasty for worsening chronic low back pain.   She was hyponatremic during her last admission so her hydrochlorothiazide was held and she was given instruction for fluid restriction. This admission her baseline hyponatremia was down  from 126 to 120. She also had evidence of fluid overload with 1+ peripheral edema documented on admission physical exam.    Additionally, the patient developed loose stools this admission concerning for C. diff given her recent hospitalization but C. diff PCR was negative.  Sodium  Date/Time Value Range Status  08/22/2011  5:00 AM 120* 135-145 (mEq/L) Final  08/22/2011 12:09 AM 118* 135-145 (mEq/L) Final     REPEATED TO VERIFY     CRITICAL RESULT CALLED TO, READ BACK BY AND VERIFIED WITH:     J BROWN,RN 08/22/11 AT 0147 BY J HICKS  08/21/2011  5:17 AM 122* 135-145 (mEq/L) Final  08/21/2011 12:12 AM 120* 135-145 (mEq/L) Final  08/20/2011  7:10 PM 120* 135-145 (mEq/L) Final  08/13/2011  6:00 AM 125* 135-145 (mEq/L) Final  08/11/2011  4:40 AM 124* 135-145 (mEq/L) Final  08/09/2011  4:00 AM 126* 135-145 (mEq/L) Final  08/08/2011  5:00 AM 125* 135-145 (mEq/L) Final  08/06/2011  4:00 AM 126* 135-145 (mEq/L) Final  08/05/2011  4:27 AM 126* 135-145 (mEq/L) Final  08/03/2011  3:41 PM 126* 135-145 (mEq/L) Final  07/15/2011 11:27 AM 126* 135-145 (mEq/L) Final      PMH:   Past Medical History  Diagnosis Date  . Coronary artery disease 2007    3 vessel CABG with LIMA-LAD, SVG to diag, and SVG - Ramus  . Hyperlipidemia   . Hypertension   . Atrial fibrillation     paroxysmal  . History of hyperkalemia   . Hyponatremia   . Diverticulitis     PSH:     Past Surgical History  Procedure Date  . Maze   . Coronary artery bypass graft   . Partial hysterectomy   . Esophagogastroduodenoscopy 08/04/2011    Procedure: ESOPHAGOGASTRODUODENOSCOPY (EGD);  Surgeon: Florencia Reasons, MD;  Location[facility] END[facility]COPY;  Service: Endoscopy;  Laterality: N/A;  do at bedside  . Abdominal hysterectomy     Allergies:  Allergies  Allergen Reactions  . Lipitor (Atorvastatin Calcium)     MUSCLE ACHES  . Niaspan (Niacin (Antihyperlipidemic)) Rash    Medications:   Prior to Admission medications   Medication Sig Start Date End Date Taking? Authorizing Provider  acetaminophen (TYLENOL) 325 MG tablet Take 650 mg by mouth every 6 (six) hours as needed.     Yes Historical Provider, MD  amiodarone (PACERONE) 400 MG tablet Take 0.5 tablets (200 mg total) by mouth 2 (two) times daily. 08/14/11 08/13/12 Yes Calvert Cantor, MD  Calcium Carbonate-Vitamin D (CALTRATE 600+D PO) Take 1 tablet by mouth daily.    Yes Historical Provider, MD  dabigatran (PRADAXA) 150 MG CAPS Take 150 mg by mouth every 12 (twelve) hours.     Yes Historical Provider, MD  diltiazem (CARDIZEM CD) 240 MG 24 hr capsule Take 1 capsule (240 mg total) by mouth daily. 08/14/11 08/13/12 Yes Calvert Cantor, MD  lisinopril (PRINIVIL,ZESTRIL) 40 MG tablet Take 40 mg by mouth daily.     Yes  Historical Provider, MD  metoprolol (LOPRESSOR) 50 MG tablet Take 1 tablet (50 mg total) by mouth 2 (two) times daily. 08/14/11 08/13/12 Yes Calvert Cantor, MD  Multiple Vitamin (MULTIVITAMIN) tablet Take 1 tablet by mouth daily.     Yes Historical Provider, MD  omeprazole (PRILOSEC OTC) 20 MG tablet Take 40 mg by mouth 2 (two) times daily.     Yes Historical Provider, MD  pravastatin (PRAVACHOL) 40 MG tablet Take 40 mg by mouth daily.     Yes Historical Provider, MD    Discontinued Meds:   Medications Discontinued During This Encounter  Medication Reason  . oxyCODONE-acetaminophen (PERCOCET) 5-325 MG per tablet Change in  therapy  . HYDROcodone-acetaminophen (NORCO) 5-325 MG per tablet Change in therapy  . pantoprazole (PROTONIX) 40 MG tablet Change in therapy  . off the beat book   . 0.9 %  sodium chloride infusion   . omeprazole (PRILOSEC OTC) EC tablet 40 mg Formulary change  . simvastatin (ZOCOR) tablet 20 mg Formulary change  . cefTRIAXone (ROCEPHIN) 1 g in dextrose 5 % 50 mL IVPB     Social History:  reports that she has never smoked. She does not have any smokeless tobacco history on file. She reports that she does not drink alcohol or use illicit drugs. Widow since June 2012. Lives at home with son, daughter-in-law and two grandchildren.   Family History:   Family History  Problem Relation Age of Onset  . Heart failure Father   . Kidney failure Father   . Bone cancer Brother   . Kidney failure Sister   . Colon cancer Sister   . Cancer Sister     Pertinent items are noted in HPI.  Blood pressure 123/53, pulse 55, temperature 98.8 F (37.1 C), temperature source Oral, resp. rate 18, height 5\' 4"  (1.626 m), weight 141 lb 8 oz (64.184 kg), SpO2 93.00%. General appearance: alert, cooperative and no distress Back: symmetric, no curvature. ROM normal. No CVA tenderness. Resp: clear to auscultation bilaterally Cardio: S1S2 RRR, no MRG.  GI: abnormal findings:  NABS, palpable bladder. No masses otherwise. No tenderness.  Extremities: edema trace and  Skin: Skin color, texture, turgor normal. No rashes or lesions Neurologic: Grossly normal  Labs: Basic Metabolic Panel:  Lab 08/22/11 1478 08/22/11 0009 08/21/11 0517 08/21/11 0012 08/20/11 1910  NA 120* 118* 122* 120* 120*  K 3.7 3.3* 4.3 3.6 3.9  CL 85* 84* 88* 89* 87*  CO2 23 24 19 21 21   GLUCOSE 93 95 87 111* 102*  BUN 8 8 11 12 15   CREATININE 0.70 0.70 0.68 0.62 0.60  ALB -- -- -- -- --  CALCIUM 8.8 8.4 8.7 8.3* 8.8  PHOS -- -- 3.2 -- --   Liver Function Tests:  Lab 08/21/11 0517 08/20/11 1910  AST 19 15  ALT 17 20  ALKPHOS 75  86  BILITOT 0.2* 0.3  PROT 6.1 6.6  ALBUMIN 3.2* 3.8   No results found for this basename: LIPASE:3,AMYLASE:3 in the last 168 hours No results found for this basename: AMMONIA:3 in the last 168 hours CBC:  Lab 08/21/11 0517 08/20/11 1910  WBC 8.5 8.6  NEUTROABS -- 6.6  HGB 10.7* 10.9*  HCT 30.9* 31.0*  MCV 86.6 85.6  PLT 276 322   PT/INR: none  Cardiac Enzymes:  Lab 08/21/11 1112 08/21/11 0517 08/21/11 0014  CKTOTAL 73 43 41  CKMB 2.5 2.6 2.8  CKMBINDEX -- -- --  TROPONINI 0.33* <0.30 <0.30   CBG:  Lab 08/21/11 1638 08/21/11 1130  GLUCAP 101* 113*    Iron Studies: No results found for this basename: IRON:30,TIBC:30,TRANSFERRIN:30,FERRITIN:30 in the last 168 hours  ProBNP 2193 Serum Osm 248 Urine Osm 492 ECHO 12/20: EF 55-60%.   Xrays/Other Studies: Dg Chest 2 View  08/20/2011  *RADIOLOGY REPORT*  Clinical Data: 78 year old female with weakness.  Recent L1 kyphoplasty.  CHEST - 2 VIEW  Comparison: 08/03/2011 and prior chest radiographs  Findings: Cardiomegaly is noted with pulmonary vascular congestion. Small bilateral pleural effusions, right greater than left, noted. There is no evidence of airspace disease, pneumothorax or definite edema. T10 and L3 compression fractures are again identified. L1 compression fracture and augmentation changes are present.  IMPRESSION: Cardiomegaly with pulmonary vascular congestion and small bilateral pleural effusions.  L1 compression fracture and augmentation changes.  Original Report Authenticated By: Rosendo Gros, M.D.   ECHO: EF 55-60%  Assessment/Plan: 78 yo F with longstanding hyponatremia (at least 2 years per son, 1 year per patient) with baseline by her recollection around 36 or so as told to her by Dr. Everlene Other, her primary care MD) presents with worsening hyponatremia despite fluid restriction and lasix (1 dose only) along with new onset loose stools.   1. Hyponatremia:  hypervolemic hyponatremia. Evidence for fluid  overload on exam with edema, bilateral pleural effusions, some clinical dyspnea. UOsm>SOsm consistent with elevated ADH secretion. No evidence of underlying malignancy on recent CXR. Patient has recently discontinued her thiazide diuretic. Plan to check serum cortisol level and TFTs. Schedule lasix 20 mg IV BID for 24 hours, then change to po (with potassium replacement) to improve free water excretion. Place foley catheter to measure strict Is and Os and for patient comfort (unable to rest with multiple trips to the bedside commode). Lab work requested from PCP to be faxed to [phone]. The precise etiology for her "chronic" component is not clear to me although the discontinued thiazide might have played some role.  2. Diastolic CHF: evidence of fluid overload marked by pleural effusions and peripheral edema. proBNP 2193. Schedule lasix.   3. Afib with RVR: per primary team. In sinus rhythm and rate controlled.   4. HTN: BP well controlled. Continue metoprolol and cardizem.   5. Anemia: H/H stable. Per primary team.    Amanda Pea [facility] Resident 08/22/2011, 12:06 PM  I have seen and examined this patient and agree with plan as outlined above with highlighted additions. Anjulie Dipierro B,MD 08/22/2011 4:05 PM

## 2011-08-22 NOTE — Progress Notes (Signed)
Critical lab value - na+ 118.  On adm, 120.  Message sent to [facility].

## 2011-08-22 NOTE — Progress Notes (Signed)
Utilization review complete 

## 2011-08-22 NOTE — Progress Notes (Signed)
Subjective: Patient seen, still has the diarrhea. Stool for C diff is pending. Sodium is still low.  Objective: Vital signs in last 24 hours: Temp:  [98.4 F (36.9 C)-98.9 F (37.2 C)] 98.8 F (37.1 C) (01/07 0457) Pulse Rate:  [55-67] 55  (01/07 0457) Resp:  [16-18] 18  (01/07 0457) BP: (123-137)/(53-72) 123/53 mmHg (01/07 0457) SpO2:  [93 %-97 %] 93 % (01/07 0457) Weight change:  Last BM Date: 08/21/11  Intake/Output from previous day: 01/06 0701 - 01/07 0700 In: 480 [P.O.:480] Out: 1400 [Urine:1400]     Physical Exam: General: Alert, awake, oriented x3, in no acute distress. HEENT: No bruits, no goiter. Heart: Regular rate and rhythm, without murmurs, rubs, gallops. Lungs: Clear to auscultation bilaterally. Abdomen: Soft, nontender, nondistended, positive bowel sounds. Extremities: Trace edema bilaterally Neuro: Grossly intact, nonfocal.    Lab Results: Basic Metabolic Panel:  Basename 08/22/11 0500 08/22/11 0009 08/21/11 0517  NA 120* 118* --  K 3.7 3.3* --  CL 85* 84* --  CO2 23 24 --  GLUCOSE 93 95 --  BUN 8 8 --  CREATININE 0.70 0.70 --  CALCIUM 8.8 8.4 --  MG -- -- 1.9  PHOS -- -- 3.2   Liver Function Tests:  [facility] 08/21/11 0517 08/20/11 1910  AST 19 15  ALT 17 20  ALKPHOS 75 86  BILITOT 0.2* 0.3  PROT 6.1 6.6  ALBUMIN 3.2* 3.8     Basename 08/21/11 0517 08/20/11 1910  WBC 8.5 8.6  NEUTROABS -- 6.6  HGB 10.7* 10.9*  HCT 30.9* 31.0*  MCV 86.6 85.6  PLT 276 322   Cardiac Enzymes:  Basename 08/21/11 1112 08/21/11 0517 08/21/11 0014  CKTOTAL 73 43 41  CKMB 2.5 2.6 2.8  CKMBINDEX -- -- --  TROPONINI 0.33* <0.30 <0.30   BNP:  Basename 08/21/11 0517  PROBNP 2193.0*   D-Dimer: No results found for this basename: DDIMER:2 in the last 72 hours CBG:  Basename 08/21/11 1638 08/21/11 1130  GLUCAP 101* 113*   Thyroid Function Tests:  Basename 08/21/11 0012  TSH 1.446  T4TOTAL --  FREET4 --  T3FREE --  THYROIDAB --   Anemia  Panel: No results found for this basename: VITAMINB12,FOLATE,FERRITIN,TIBC,IRON,RETICCTPCT in the last 72 hours Coagulation:  Basename 08/20/11 1910  LABPROT 16.8*  INR 1.34   Urine Drug Screen: Drugs of Abuse  No results found for this basename: labopia, cocainscrnur, labbenz, amphetmu, thcu, labbarb     Recent Results (from the past 240 hour(s))  URINE CULTURE     Status: Normal   Collection Time   08/20/11  8:00 PM      Component Value Range Status Comment   Specimen Description URINE, CLEAN CATCH   Final    Special Requests NONE   Final    Setup Time [id number]   Final    Colony Count 55,000 COLONIES/ML   Final    Culture     Final    Value: Multiple bacterial morphotypes present, none predominant. Suggest appropriate recollection if clinically indicated.   Report Status 08/21/2011 FINAL   Final     Studies/Results: Dg Chest 2 View  08/20/2011  *RADIOLOGY REPORT*  Clinical Data: 78 year old female with weakness.  Recent L1 kyphoplasty.  CHEST - 2 VIEW  Comparison: 08/03/2011 and prior chest radiographs  Findings: Cardiomegaly is noted with pulmonary vascular congestion. Small bilateral pleural effusions, right greater than left, noted. There is no evidence of airspace disease, pneumothorax or definite edema. T10 and L3  compression fractures are again identified. L1 compression fracture and augmentation changes are present.  IMPRESSION: Cardiomegaly with pulmonary vascular congestion and small bilateral pleural effusions.  L1 compression fracture and augmentation changes.  Original Report Authenticated By: Rosendo Gros, M.D.    Medications: Scheduled Meds:   . amiodarone  200 mg Oral BID  . cefTRIAXone (ROCEPHIN) IVPB 1 gram/50 mL D5W  1 g Intravenous Q24H  . dabigatran  150 mg Oral Q12H  . diltiazem  240 mg Oral Daily  . furosemide  20 mg Intravenous Once  . lisinopril  40 mg Oral Daily  . metoprolol  50 mg Oral BID  . pantoprazole  80 mg Oral BID AC  . rosuvastatin  5  mg Oral q1800  . sodium chloride  3 mL Intravenous Q12H  . DISCONTD: simvastatin  20 mg Oral q1800   Continuous Infusions:  PRN Meds:.sodium chloride, acetaminophen, acetaminophen, acetaminophen, albuterol, alum & mag hydroxide-simeth, guaiFENesin-dextromethorphan, ondansetron (ZOFRAN) IV, ondansetron, sodium chloride, zolpidem  Assessment/Plan:   Hyponatremia:  Acute on Chronic  Will get nephrology consult.  ? UTI: Urine culture growing morphophytes. Will d/c rocephin.  Altered mental status: Resolved.  Atrial fibrillation with RVR Continue Cardizem, Metoprolol. Continue Pradaxa.  HTN (hypertension) BP stable. Cont metoprolol, Cardizem.  Anemia associated with acute blood loss Sec to peptic ulcer disease. H&H is stable      LOS: 2 days   [facility] Pager: [phone] 08/22/2011, 8:48 AM

## 2011-08-22 NOTE — Progress Notes (Signed)
Lenny Pastel called back.  Made aware of prior note re:  Sodium.  Increased FR to 1200 cc's.  Wanted rounding MD to be made aware in AM.  Sticky note also placed on shadow chart.

## 2011-08-22 NOTE — Progress Notes (Signed)
CARE MANAGEMENT NOTE 08/22/2011  Patient:  NAOMY, ESHAM   Account Number:  [id number]  Date Initiated:  08/22/2011  Documentation initiated by:  [facility]  Subjective/Objective Assessment:   low sodium, back surgery     Action/Plan:   Anticipated DC Date:  08/24/2011   Anticipated DC Plan:  HOME W HOME HEALTH SERVICES      DC Planning Services  CM consult      Choice offered to / List presented to:             [facility] agency  [facility]   Status of service:  In process, will continue to follow Medicare Important Message given?   (If response is "NO", the following Medicare IM given date fields will be blank) Date Medicare IM given:   Date Additional Medicare IM given:    Discharge Disposition:    Per UR Regulation:    Comments:  1.02/2012 1000 Spoke to [employer] RN with [employer]. They were following pt for [facility] PT post last d/c and pt's insurance changed. She was set up with [facility] and plans to go home with [facility] at d/c. Will need new orders for [facility] at d/c. CM will continue to follow up with pt until d/c. Isidoro Donning RN CCM Case Mgmt phone [phone]

## 2011-08-23 ENCOUNTER — Encounter: Payer: No Typology Code available for payment source | Admitting: Cardiovascular Disease

## 2011-08-23 LAB — BASIC METABOLIC PANEL
CO2: 24 mEq/L (ref 19–32)
Calcium: 8.4 mg/dL (ref 8.4–10.5)
Creatinine, Ser: 0.74 mg/dL (ref 0.50–1.10)
GFR calc non Af Amer: 77 mL/min — ABNORMAL LOW (ref >90)
Sodium: 123 mEq/L — ABNORMAL LOW (ref 135–145)

## 2011-08-23 LAB — CORTISOL: Cortisol, Plasma: 16.8 ug/dL

## 2011-08-23 MED ORDER — FUROSEMIDE 20 MG PO TABS
20.0000 mg | ORAL_TABLET | Freq: Two times a day (BID) | ORAL | Status: DC
  Administered 2011-08-23 – 2011-08-25 (×5): 20 mg via ORAL
  Filled 2011-08-23 (×6): qty 1

## 2011-08-23 NOTE — Plan of Care (Signed)
Problem: Phase I Progression Outcomes Goal: Voiding-avoid urinary catheter unless indicated Outcome: Not Progressing Pt has foley cath and md wants her to keep it for now to obtain accurate i&o

## 2011-08-23 NOTE — Progress Notes (Signed)
Subjective: Interval History: up in chair. Complaining of pain with foley catheter.   Objective:  Vital signs in last 24 hours:  Temp:  [98.3 F (36.8 C)-98.5 F (36.9 C)] 98.5 F (36.9 C) (01/08 0418) Pulse Rate:  [59-74] 74  (01/08 0418) Resp:  [18-20] 20  (01/08 0418) BP: (100-126)/(57-67) 116/64 mmHg (01/08 0418) SpO2:  [94 %-99 %] 94 % (01/08 0418) Weight change:   Intake/Output: I/O last 3 completed shifts: In: 1063 [P.O.:1060; I.V.:3] Out: 1400 [Urine:1400]  Intake/Output this shift:  Total I/O In: 180 [P.O.:180] Out: 600 [Urine:600]  EXAM: CVS-sinus rhythm. No MRG.  RS- normal WOB. CTA b/l. No rales.  ABD-NABS. Soft NT/ND.  EXT- no edema.   Lab Results:  Basename 08/21/11 0517 08/20/11 1910  WBC 8.5 8.6  HGB 10.7* 10.9*  HCT 30.9* 31.0*  PLT 276 322   BMET  Basename 08/23/11 0520 08/22/11 0500 08/22/11 0009 08/21/11 0517  NA 123* 120* 118* --  K 3.7 3.7 3.3* --  CL 89* 85* 84* --  CO2 24 23 24  --  GLUCOSE 92 93 95 --  BUN 12 8 8  --  CREATININE 0.74 0.70 0.70 --  CALCIUM 8.4 8.8 8.4 --  PHOS -- -- -- 3.2   LFT  Basename 08/21/11 0517  PROT 6.1  ALBUMIN 3.2*  AST 19  ALT 17  ALKPHOS 75  BILITOT 0.2*  BILIDIR --  IBILI --   PT/INR  Basename 08/20/11 1910  LABPROT 16.8*  INR 1.34   Hepatitis Panel No results found for this basename: HEPBSAG,HCVAB,HEPAIGM,HEPBIGM in the last 72 hours PTH: Lab Results  Component Value Date   CALCIUM 8.4 08/23/2011   CAION 1.14 08/03/2011   PHOS 3.2 08/21/2011  Cortisol - 16.8 TSH 1.349 Studies/Results: No results found.  Assessment/Plan: 78 yo F with longstanding hyponatremia (at least 2 years per son, 1 year per patient) with baseline by her recollection around 26 or so as told to her by Dr. Everlene Other, her primary care MD) presents with worsening hyponatremia despite fluid restriction and lasix (1 dose only).   1. Hyponatremia: hypervolemic hyponatremia. Improved with lasix 20 mg IV x 2. Normal TFT  and serum cortisol. Plan to continue scheduled lasix along with K+. Will transition to PO lasix. D/C foley catheter.   2. Diastolic CHF: evidence of fluid overload marked by pleural effusions and peripheral edema. proBNP 2193. Schedule lasix.   3. Afib with RVR: per primary team. In sinus rhythm and rate controlled.   4. HTN: BP well controlled. Continue metoprolol and cardizem. Switch IV lasix to PO after 0800 dose as per orders.   5. Anemia: No evidence of active bleeding. No new H/H today. Per primary team.     LOS: 3 FUNCHES,JOSALYN 08/22/10 6:52 AM  I have seen and examined this patient and agree with plan as outlined by Dr. Armen Pickup. Anwen Cannedy B,MD 08/23/2011 9:01 AM

## 2011-08-23 NOTE — Progress Notes (Signed)
Subjective: Patient admitted with acute on chronic hyponatremia, weakness, fatigue. Patient was recently discharged from the hospital. She does have a history of chronic hyponatremia due to HCTZ use. Nephrology was consulted as sodium was not improving, nephrology has started the patient on Lasix as patient is fluid overload, her BNP is elevated. Today sodium better and has increased to 123. Patient complained of pain with Foley catheter which was discontinued this morning.  Objective: Vital signs in last 24 hours: Temp:  [98 F (36.7 C)-98.5 F (36.9 C)] 98 F (36.7 C) (01/08 1420) Pulse Rate:  [60-74] 60  (01/08 1420) Resp:  [18-20] 18  (01/08 1420) BP: (100-116)/(57-64) 115/61 mmHg (01/08 1420) SpO2:  [94 %-99 %] 97 % (01/08 1420) Weight change:  Last BM Date: 08/22/11  Intake/Output from previous day: 01/07 0701 - 01/08 0700 In: 763 [P.O.:760; I.V.:3] Out: 1200 [Urine:1200] Total I/O In: 360 [P.O.:360] Out: 400 [Urine:400]   Physical Exam: General: Alert, awake, oriented x3, in no acute distress. HEENT: No bruits, no goiter. Heart: Regular rate and rhythm, without murmurs, rubs, gallops. Lungs: Clear to auscultation bilaterally. Abdomen: Soft, nontender, nondistended, positive bowel sounds. Extremities: Trace edema bilaterally Neuro: Grossly intact, nonfocal.    Lab Results: Basic Metabolic Panel:  Basename 08/23/11 0520 08/22/11 0500 08/21/11 0517  NA 123* 120* --  K 3.7 3.7 --  CL 89* 85* --  CO2 24 23 --  GLUCOSE 92 93 --  BUN 12 8 --  CREATININE 0.74 0.70 --  CALCIUM 8.4 8.8 --  MG -- -- 1.9  PHOS -- -- 3.2   Liver Function Tests:  [facility] 08/21/11 0517 08/20/11 1910  AST 19 15  ALT 17 20  ALKPHOS 75 86  BILITOT 0.2* 0.3  PROT 6.1 6.6  ALBUMIN 3.2* 3.8     Basename 08/21/11 0517 08/20/11 1910  WBC 8.5 8.6  NEUTROABS -- 6.6  HGB 10.7* 10.9*  HCT 30.9* 31.0*  MCV 86.6 85.6  PLT 276 322   Cardiac Enzymes:  Basename 08/21/11 1112 08/21/11  0517 08/21/11 0014  CKTOTAL 73 43 41  CKMB 2.5 2.6 2.8  CKMBINDEX -- -- --  TROPONINI 0.33* <0.30 <0.30   BNP:  Basename 08/21/11 0517  PROBNP 2193.0*   D-Dimer: No results found for this basename: DDIMER:2 in the last 72 hours CBG:  Basename 08/21/11 1638 08/21/11 1130  GLUCAP 101* 113*   Thyroid Function Tests:  Basename 08/22/11 1828  TSH 1.349  T4TOTAL --  FREET4 1.59  T3FREE --  THYROIDAB --   Anemia Panel: No results found for this basename: VITAMINB12,FOLATE,FERRITIN,TIBC,IRON,RETICCTPCT in the last 72 hours Coagulation:  Basename 08/20/11 1910  LABPROT 16.8*  INR 1.34   Urine Drug Screen: Drugs of Abuse  No results found for this basename: labopia,  cocainscrnur,  labbenz,  amphetmu,  thcu,  labbarb     Recent Results (from the past 240 hour(s))  URINE CULTURE     Status: Normal   Collection Time   08/20/11  8:00 PM      Component Value Range Status Comment   Specimen Description URINE, CLEAN CATCH   Final    Special Requests NONE   Final    Setup Time [id number]   Final    Colony Count 55,000 COLONIES/ML   Final    Culture     Final    Value: Multiple bacterial morphotypes present, none predominant. Suggest appropriate recollection if clinically indicated.   Report Status 08/21/2011 FINAL   Final   CLOSTRIDIUM  DIFFICILE BY PCR     Status: Normal   Collection Time   08/22/11  7:39 AM      Component Value Range Status Comment   C difficile by pcr NEGATIVE  NEGATIVE  Final     Studies/Results: No results found.  Medications: Scheduled Meds:    . amiodarone  200 mg Oral BID  . dabigatran  150 mg Oral Q12H  . diltiazem  240 mg Oral Daily  . furosemide  20 mg Intravenous BID  . furosemide  20 mg Oral BID  . lisinopril  40 mg Oral Daily  . metoprolol  50 mg Oral BID  . pantoprazole  80 mg Oral BID AC  . phenazopyridine  200 mg Oral Once  . potassium chloride  20 mEq Oral BID  . rosuvastatin  5 mg Oral q1800  . sodium chloride  3 mL  Intravenous Q12H   Continuous Infusions:  PRN Meds:.sodium chloride, acetaminophen, acetaminophen, acetaminophen, albuterol, alum & mag hydroxide-simeth, guaiFENesin-dextromethorphan, ondansetron (ZOFRAN) IV, ondansetron, sodium chloride, zolpidem  Assessment/Plan:   Hyponatremia:  Acute on Chronic Hypervolemic hyponatremia. Sodium is now improving. The plan is to continue Lasix 20 mg by mouth twice a day. Nephrology is following.  ? UTI: Urine culture growing morphophytes. Rocephin has been discontinued  Altered mental status: Resolved.  Atrial fibrillation with RVR Continue Cardizem, Metoprolol. Continue Pradaxa.  HTN (hypertension) BP stable. Cont metoprolol, Cardizem.  Anemia associated with acute blood loss Sec to peptic ulcer disease. H&H is stable  Disposition DC home when sodium improves    LOS: 3 days   [facility] Pager: [phone] 08/23/2011, 4:06 PM

## 2011-08-24 LAB — BASIC METABOLIC PANEL
BUN: 14 mg/dL (ref 6–23)
Calcium: 8.8 mg/dL (ref 8.4–10.5)
GFR calc Af Amer: 77 mL/min — ABNORMAL LOW (ref >90)
GFR calc non Af Amer: 66 mL/min — ABNORMAL LOW (ref >90)
Glucose, Bld: 92 mg/dL (ref 70–99)
Sodium: 124 mEq/L — ABNORMAL LOW (ref 135–145)

## 2011-08-24 MED ORDER — POTASSIUM CHLORIDE CRYS ER 20 MEQ PO TBCR
20.0000 meq | EXTENDED_RELEASE_TABLET | Freq: Every day | ORAL | Status: DC
  Administered 2011-08-24 – 2011-08-25 (×2): 20 meq via ORAL
  Filled 2011-08-24 (×2): qty 1

## 2011-08-24 MED ORDER — ACETAMINOPHEN 500 MG PO TABS
500.0000 mg | ORAL_TABLET | Freq: Three times a day (TID) | ORAL | Status: DC
  Administered 2011-08-24 (×2): 500 mg via ORAL
  Filled 2011-08-24 (×5): qty 1

## 2011-08-24 NOTE — Progress Notes (Signed)
Physical Therapy Evaluation Patient Details Name: Donna Ortega MRN: [id number] DOB: 08/17/1925 Today's Date: 08/24/2011  Problem List:  Patient Active Problem List  Diagnoses  . CAD (coronary artery disease)  . Atrial fibrillation with RVR  . HTN (hypertension)  . Melena  . Hypotension  . Anemia associated with acute blood loss  . Hyponatremia  . Lumbar back pain  . GI bleed  . Dyslipidemia  . Chronic kidney disease (CKD), stage III (moderate)  . Peptic ulcer disease with hemorrhage  . Lumbar compression fracture  . S/P vertebroplasty  . UTI (lower urinary tract infection)  . Fatigue  . Confused    Past Medical History:  Past Medical History  Diagnosis Date  . Coronary artery disease 2007    3 vessel CABG with LIMA-LAD, SVG to diag, and SVG - Ramus  . Hyperlipidemia   . Hypertension   . Atrial fibrillation     paroxysmal  . History of hyperkalemia   . Hyponatremia   . Diverticulitis    Past Surgical History:  Past Surgical History  Procedure Date  . Maze   . Coronary artery bypass graft   . Partial hysterectomy   . Esophagogastroduodenoscopy 08/04/2011    Procedure: ESOPHAGOGASTRODUODENOSCOPY (EGD);  Surgeon: Donna Reasons, MD;  Location[facility] ENDOSCOPY;  Service: Endoscopy;  Laterality: N/A;  do at bedside  . Abdominal hysterectomy     PT Assessment/Plan/Recommendation PT Assessment Clinical Impression Statement: pt presents with Hyponatremia and recent lumbar fxs.  pt very motivated for ambulating and going home.   PT Recommendation/Assessment: All further PT needs can be met in the next venue of care No Skilled PT: All education completed;Patient will have necessary level of assist by caregiver at discharge PT Recommendation Follow Up Recommendations: Home health PT Equipment Recommended: None recommended by PT PT Goals     PT Evaluation Precautions/Restrictions  Precautions Precautions: Back Required Braces or Orthoses: Yes Spinal Brace: Lumbar  corset Restrictions Weight Bearing Restrictions: No Prior Functioning  Home Living Lives With: Donna Ortega Help From: Family Type of Home: House Home Layout: Two level Alternate Level Stairs-Rails: Right Alternate Level Stairs-Number of Steps: 12 Home Access: Stairs to enter Entrance Stairs-Rails: None Entrance Stairs-Number of Steps: 2 Prior Function Level of Independence: Independent with basic ADLs;Independent with transfers;Independent with homemaking with ambulation Able to Take Stairs?: Yes Driving: No Vocation: Retired Financial risk analyst Overall Cognitive Status: Appears within functional limits for tasks assessed Sensation/Coordination   Extremity Assessment RLE Assessment RLE Assessment: Within Functional Limits LLE Assessment LLE Assessment: Within Functional Limits Mobility (including Balance) Bed Mobility Bed Mobility: No Transfers Transfers: Yes Sit to Stand: 6: Modified independent (Device/Increase time);From chair/3-in-1;With upper extremity assist Stand to Sit: 6: Modified independent (Device/Increase time);With upper extremity assist;To chair/3-in-1 Ambulation/Gait Ambulation/Gait: Yes Ambulation/Gait Assistance: 5: Supervision Ambulation/Gait Assistance Details (indicate cue type and reason): pt eager for ambulating and needs min cueing for positioning in RW Ambulation Distance (Feet): 500 Feet Assistive device: Rolling walker Gait Pattern: Decreased stride length Stairs: No Wheelchair Mobility Wheelchair Mobility: No  Posture/Postural Control Posture/Postural Control: No significant limitations Exercise    End of Session PT - End of Session Equipment Utilized During Treatment: Back brace Activity Tolerance: Patient tolerated treatment well Patient left: in chair;with call bell in reach Nurse Communication: Mobility status for transfers;Mobility status for ambulation General Behavior During Session: [facility] for tasks performed Cognition: [facility]  for tasks performed  Donna Ortega, [place] [phone] 08/24/2011, 3:42 PM

## 2011-08-24 NOTE — Progress Notes (Signed)
Subjective: Patient admitted with acute on chronic hyponatremia, weakness, fatigue. Patient was recently discharged from the hospital. She does have a history of chronic hyponatremia. Nephrology was consulted as sodium was not improving, nephrology has started the patient on Lasix as patient is fluid overload, her BNP is elevated.  Also her urine osmolarity studies suggest c/o siadh  Objective: Vital signs in last 24 hours: Temp:  [97.6 F (36.4 C)-98 F (36.7 C)] 97.9 F (36.6 C) (01/09 1333) Pulse Rate:  [57-61] 59  (01/09 1333) Resp:  [16-18] 18  (01/09 1333) BP: (99-137)/(55-71) 99/62 mmHg (01/09 1333) SpO2:  [97 %] 97 % (01/09 1333) Weight change:  Last BM Date: 08/24/11  Intake/Output from previous day: 01/08 0701 - 01/09 0700 In: 360 [P.O.:360] Out: 1150 [Urine:1150] Total I/O In: 603 [P.O.:600; I.V.:3] Out: -    Physical Exam: General: Alert, awake, oriented x3, in no acute distress. Heart: Regular rate and rhythm, without murmurs, rubs, gallops. Lungs: Clear to auscultation bilaterally. Abdomen: Soft, nontender, nondistended, positive bowel sounds. Extremities: Trace edema bilaterally     Lab Results: Basic Metabolic Panel:  Basename 08/24/11 0558 08/23/11 0520  NA 124* 123*  K 4.3 3.7  CL 90* 89*  CO2 25 24  GLUCOSE 92 92  BUN 14 12  CREATININE 0.80 0.74  CALCIUM 8.8 8.4  MG -- --  PHOS -- --     Basename 08/22/11 1828  TSH 1.349  T4TOTAL --  FREET4 1.59  T3FREE --  THYROIDAB --     Recent Results (from the past 240 hour(s))  URINE CULTURE     Status: Normal   Collection Time   08/20/11  8:00 PM      Component Value Range Status Comment   Specimen Description URINE, CLEAN CATCH   Final    Special Requests NONE   Final    Setup Time [id number]   Final    Colony Count 55,000 COLONIES/ML   Final    Culture     Final    Value: Multiple bacterial morphotypes present, none predominant. Suggest appropriate recollection if clinically  indicated.   Report Status 08/21/2011 FINAL   Final   CLOSTRIDIUM DIFFICILE BY PCR     Status: Normal   Collection Time   08/22/11  7:39 AM      Component Value Range Status Comment   C difficile by pcr NEGATIVE  NEGATIVE  Final     Studies/Results: No results found.  Medications: Scheduled Meds:    . acetaminophen  500 mg Oral TID  . amiodarone  200 mg Oral BID  . dabigatran  150 mg Oral Q12H  . diltiazem  240 mg Oral Daily  . furosemide  20 mg Oral BID  . lisinopril  40 mg Oral Daily  . metoprolol  50 mg Oral BID  . pantoprazole  80 mg Oral BID AC  . potassium chloride  20 mEq Oral Daily  . rosuvastatin  5 mg Oral q1800  . sodium chloride  3 mL Intravenous Q12H  . DISCONTD: potassium chloride  20 mEq Oral BID   Continuous Infusions:  PRN Meds:.sodium chloride, acetaminophen, acetaminophen, acetaminophen, albuterol, alum & mag hydroxide-simeth, guaiFENesin-dextromethorphan, ondansetron (ZOFRAN) IV, ondansetron, sodium chloride, zolpidem  Assessment/Plan:   Hyponatremia:  Acute on Chronic Hypervolemic hyponatremia. With chronic c/o euvolemic hyponatemia  Sodium is now improving. The plan is to continue Lasix 20 mg by mouth twice a day. Nephrology is following.  ? UTI: Urine culture growing morphophytes. Rocephin has been discontinued  Altered mental status: Resolved.  Atrial fibrillation with RVR Continue Cardizem, Metoprolol. Continue Pradaxa.  HTN (hypertension) BP stable. Cont metoprolol, Cardizem.  Anemia associated with acute blood loss Sec to peptic ulcer disease. H&H is stable  Disposition DC home when sodium improves    LOS: 4 days   [name] [facility] Pager: [phone] 08/24/2011, 4:38 PM

## 2011-08-24 NOTE — Clinical Documentation Improvement (Signed)
CHF DOCUMENTATION CLARIFICATION QUERY  THIS DOCUMENT IS NOT A PERMANENT PART OF THE MEDICAL RECORD  TO RESPOND TO THE THIS QUERY, FOLLOW THE INSTRUCTIONS BELOW:  1. If needed, update documentation for the patient's encounter via the notes activity.  2. Access this query again and click edit on the In [employer].  3. After updating, or not, click F2 to complete all highlighted (required) fields concerning your review. Select "additional documentation in the medical record" OR "no additional documentation provided".  4. Click Sign note button.  5. The deficiency will fall out of your In Basket *Please let us know if you are not able to complete this workflow by phone or e-mail (listed below).  Please update your documentation within the medical record to reflect your response to this query.                                                                                    08/24/11  Dear Dr.Lama[facility],  In a better effort to capture your patient's severity of illness, reflect appropriate length of stay and utilization of resources, a review of the patient medical record has revealed the following indicators the diagnosis of Heart Failure.    Based on your clinical judgment, please clarify and document in a progress note and/or discharge summary the clinical condition associated with the following supporting information:   Possible Clinical Conditions?  Chronic Systolic Congestive Heart Failure Chronic Diastolic Congestive Heart Failure Chronic Systolic & Diastolic Congestive Heart Failure Acute Systolic Congestive Heart Failure Acute Diastolic Congestive Heart Failure Acute Systolic & Diastolic Congestive Heart Failure Acute on Chronic Systolic Congestive Heart Failure Acute on Chronic Diastolic Congestive Heart Failure Acute on Chronic Systolic & Diastolic  Congestive Heart Failure Other Condition________________________________________ Cannot Clinically  Determine  Supporting Information:  Signs & Symptoms:   Noted Diastolic CHF, evidence of fluid overload marked by pleural effusions and peripheral edema; proBNP 2193; scheduled low dose Lasix and potassium per 08/24/11 progress note.  Reviewed: No provider response.  Thank You,  Marciano Sequin,  Clinical Documentation Specialist:  Pager: [phone]  Health Information Management [employer]

## 2011-08-24 NOTE — Clinical Documentation Improvement (Signed)
CHANGE MENTAL STATUS DOCUMENTATION CLARIFICATION   THIS DOCUMENT IS NOT A PERMANENT PART OF THE MEDICAL RECORD  TO RESPOND TO THE THIS QUERY, FOLLOW THE INSTRUCTIONS BELOW:  1. If needed, update documentation for the patient's encounter via the notes activity.  2. Access this query again and click edit on the In [employer].  3. After updating, or not, click F2 to complete all highlighted (required) fields concerning your review. Select "additional documentation in the medical record" OR "no additional documentation provided".  4. Click Sign note button.  5. The deficiency will fall out of your In Basket *Please let us know if you are not able to complete this workflow by phone or e-mail (listed below).         08/24/11  Dear Dr. Sharl Ma Marton Redwood  In an effort to better capture your patient's severity of illness, reflect appropriate length of stay and utilization of resources, a review of the patient medical record has revealed the following indicators.    Based on your clinical judgment, please clarify and document in a progress note and/or discharge summary the clinical condition associated with the following supporting information:   Possible Clinical Conditions?  _______Encephalopathy (describe type if known)                       Anoxic                       Septic                       Alcoholic                        Hepatic                       Hypertensive                       Metabolic                       Toxic  _______Drug induced confusion/delirium _______Acute confusion _______Acute delirium _______Acute exacerbation of known dementia (indicate type) _______New diagnosis of Dementia, Alzheimer's, cerebral atherosclerosis _______Other Condition__________________ _______Cannot Clinically Determine    Supporting Information:  Signs & Sympptoms:  Altered mental status changes likely 2/2  UTI vs Hyponatremia noted per 08/21/11 progress note.   Reviewed: No  provider response.  Thank You,  Marciano Sequin,  Clinical Documentation Specialist:  Pager: [phone]  Health Information Management [place]

## 2011-08-24 NOTE — Progress Notes (Signed)
Subjective: Interval History: up in chair. No complaints. Slept well. Reports that she did not have much urine output last night.  Walking in the hall with walker later in the AM - not SOB  Objective:  Vital signs in last 24 hours:  Temp:  [97.6 F (36.4 C)-98 F (36.7 C)] 98 F (36.7 C) (01/09 0544) Pulse Rate:  [57-61] 57  (01/09 0544) Resp:  [16-18] 16  (01/09 0544) BP: (112-137)/(55-64) 137/64 mmHg (01/09 0544) SpO2:  [97 %] 97 % (01/09 0544) Weight change:   Intake/Output:. Intake: 360 ml Output 400 ml Net -40 Net since admission -1,799     EXAM: CVS-sinus rhythm. No MRG.  RS- normal WOB. CTA b/l. No rales.  ABD-NABS. Soft NT/ND.  EXT- trace foot an and ankle edema.   Lab Results: No results found for this basename: WBC:3,HGB:3,HCT:3,PLT:3 in the last 72 hours BMET  Basename 08/24/11 0558 08/23/11 0520 08/22/11 0500  NA 124* 123* 120*  K 4.3 3.7 3.7  CL 90* 89* 85*  CO2 25 24 23   GLUCOSE 92 92 93  BUN 14 12 8   CREATININE 0.80 0.74 0.70  CALCIUM 8.8 8.4 8.8  PHOS -- -- --   PTH: Lab Results  Component Value Date   CALCIUM 8.4 08/23/2011   CAION 1.14 08/03/2011   PHOS 3.2 08/21/2011  Cortisol - 16.8 TSH 1.349  Assessment/Plan: 78 yo F with longstanding hyponatremia (at least 2 years per son, 1 year per patient) with baseline by her recollection around 41 or so as told to her by Dr. Everlene Other, her primary care MD) presents with worsening hyponatremia despite fluid restriction and lasix (1 dose only).   1. Hyponatremia: hypervolemic hyponatremia. Improved with lasix 20 mg IV x 2. Continues to improve with lasix 20 PO BID. Normal TFT and serum cortisol. Plan to continue scheduled lasix along with K+ (but will reduce KDUR to once/day  Continue free water restriction  2. Diastolic CHF: evidence of fluid overload marked by pleural effusions and peripheral edema. proBNP 2193. Scheduled low dose  Lasix and potassium.   3. Afib with RVR: per primary team. In sinus  rhythm and rate controlled.   4. HTN: BP well controlled. Continue metoprolol and cardizem, low dose lasix with potassium.   5. Anemia: No evidence of active bleeding. No new H/H today. Per primary team.   LOS: 4 FUNCHES,JOSALYN 08/22/10 7:32 AM I have seen and examined this patient and agree with plan  As outlined.  Would continue low dose BID lasix, decrease K to QD (done) and send out on that regimen.  Sodium is approaching previous baseline.  Still not clear on the more chronic component to her hyponatremia.  She can followup with Dr. Everlene Other who has been monitoring her sodium chronically.  We will sign off at this time but we are available for questions if they arise. Althea Backs B,MD 08/24/2011 9:52 AM

## 2011-08-25 LAB — BASIC METABOLIC PANEL
BUN: 13 mg/dL (ref 6–23)
CO2: 25 mEq/L (ref 19–32)
Calcium: 8.5 mg/dL (ref 8.4–10.5)
Creatinine, Ser: 0.8 mg/dL (ref 0.50–1.10)
Glucose, Bld: 103 mg/dL — ABNORMAL HIGH (ref 70–99)

## 2011-08-25 MED ORDER — ACETAMINOPHEN 500 MG PO TABS: 500.0000 mg | ORAL_TABLET | Freq: Four times a day (QID) | ORAL | Status: AC

## 2011-08-25 MED ORDER — POTASSIUM CHLORIDE CRYS ER 20 MEQ PO TBCR: 20.0000 meq | EXTENDED_RELEASE_TABLET | Freq: Every day | ORAL | Status: DC

## 2011-08-25 MED ORDER — FUROSEMIDE 20 MG PO TABS: 20.0000 mg | ORAL_TABLET | Freq: Every day | ORAL | Status: DC

## 2011-08-25 NOTE — Progress Notes (Signed)
Subjective: Patient admitted with acute on chronic hyponatremia, weakness, fatigue. Patient was recently discharged from the hospital. She does have a history of chronic hyponatremia. Nephrology was consulted as sodium was not improving, nephrology has started the patient on Lasix as patient is fluid overload, her BNP is elevated.  Also her urine osmolarity studies suggest siadh   Objective: Vital signs in last 24 hours: Temp:  [97.8 F (36.6 C)-98.5 F (36.9 C)] 98.5 F (36.9 C) (01/10 0559) Pulse Rate:  [59-64] 59  (01/10 0559) Resp:  [16-18] 16  (01/10 0559) BP: (99-138)/(62-71) 138/63 mmHg (01/10 0559) SpO2:  [96 %-99 %] 99 % (01/10 0559) Weight change:  Last BM Date: 08/24/11  Intake/Output from previous day: 01/09 0701 - 01/10 0700 In: 603 [P.O.:600; I.V.:3] Out: 1401 [Urine:1400; Stool:1]     Physical Exam: General: Alert, awake, oriented x3, in no acute distress. Heart: Regular rate and rhythm, without murmurs, rubs, gallops. Lungs: Clear to auscultation bilaterally. Abdomen: Soft, nontender, nondistended, positive bowel sounds. Extremities: Trace edema bilaterally     Lab Results: Basic Metabolic Panel:  Basename 08/25/11 0510 08/24/11 0558  NA 132* 124*  K 3.8 4.3  CL 99 90*  CO2 25 25  GLUCOSE 103* 92  BUN 13 14  CREATININE 0.80 0.80  CALCIUM 8.5 8.8  MG -- --  PHOS -- --     Basename 08/22/11 1828  TSH 1.349  T4TOTAL --  FREET4 1.59  T3FREE --  THYROIDAB --     Recent Results (from the past 240 hour(s))  URINE CULTURE     Status: Normal   Collection Time   08/20/11  8:00 PM      Component Value Range Status Comment   Specimen Description URINE, CLEAN CATCH   Final    Special Requests NONE   Final    Setup Time [id number]   Final    Colony Count 55,000 COLONIES/ML   Final    Culture     Final    Value: Multiple bacterial morphotypes present, none predominant. Suggest appropriate recollection if clinically indicated.   Report Status  08/21/2011 FINAL   Final   CLOSTRIDIUM DIFFICILE BY PCR     Status: Normal   Collection Time   08/22/11  7:39 AM      Component Value Range Status Comment   C difficile by pcr NEGATIVE  NEGATIVE  Final     Studies/Results: No results found.  Medications: Scheduled Meds:    . acetaminophen  500 mg Oral TID  . amiodarone  200 mg Oral BID  . dabigatran  150 mg Oral Q12H  . diltiazem  240 mg Oral Daily  . furosemide  20 mg Oral BID  . lisinopril  40 mg Oral Daily  . metoprolol  50 mg Oral BID  . pantoprazole  80 mg Oral BID AC  . potassium chloride  20 mEq Oral Daily  . rosuvastatin  5 mg Oral q1800  . sodium chloride  3 mL Intravenous Q12H  . DISCONTD: potassium chloride  20 mEq Oral BID   Continuous Infusions:  PRN Meds:.sodium chloride, acetaminophen, acetaminophen, acetaminophen, albuterol, alum & mag hydroxide-simeth, guaiFENesin-dextromethorphan, ondansetron (ZOFRAN) IV, ondansetron, sodium chloride, zolpidem  Assessment/Plan:   Hyponatremia:  Acute on Chronic Hypervolemic hyponatremia. With chronic c/o euvolemic hyponatemia  Sodium is now improving. The plan is to continue Lasix 20 mg by mouth twice a day. Nephrology is following.  Dysuria: Urine culture growing morphophytes. Rocephin has been discontinued  Altered mental status: Resolved.  Atrial fibrillation with RVR Continue Cardizem, Metoprolol. Continue Pradaxa.  HTN (hypertension) BP stable. Cont metoprolol, Cardizem.  Anemia associated with acute blood loss Sec to peptic ulcer disease. H&H is stable  Disposition DC home   LOS: 5 days   Miguel Medal [facility] Pager: [phone] 08/25/2011, 7:32 AM

## 2011-08-25 NOTE — Progress Notes (Signed)
Pt discharge instructions and education complete. IV site d/c. Site WNL. Pt and son had no further questions. No s/s of distress. Pt discharged via wheelchair home with son. Dion Saucier

## 2011-08-25 NOTE — Progress Notes (Signed)
   CARE MANAGEMENT NOTE 08/25/2011  Patient:  Donna Ortega, Donna Ortega   Account Number:  [id number]  Date Initiated:  08/22/2011  Documentation initiated by:  [facility]  Subjective/Objective Assessment:   low sodium, back surgery     Action/Plan:   lives w family, son douglas plans for pt to return home at disch   Anticipated DC Date:  08/25/2011   Anticipated DC Plan:  HOME W HOME HEALTH SERVICES      DC Planning Services  CM consult      [facility] Choice  Resumption Of Svcs/PTA Provider   Choice offered to / List presented to:          [facility] arranged  HH-1 RN  HH-2 PT      [facility] agency  [facility]   Status of service:  Completed, signed off Medicare Important Message given?   (If response is "NO", the following Medicare IM given date fields will be blank) Date Medicare IM given:   Date Additional Medicare IM given:    Discharge Disposition:  HOME W HOME HEALTH SERVICES  Per UR Regulation:  Reviewed for med. necessity/level of care/duration of stay  Comments:  son Milderd Meager [phone]  pcp dr Tracey Harries 1/10 alerted dan nedham w [facility] of dc planned for today, pt up chr looking forward to disch, debbie Donye Dauenhauer rn,bsn 1-7 spoke w son and also w dana needham w [facility] to let them know pt in hosp and room #. debbie Maylon Sailors rn,bsn [phone]  1.02/2012 1000 Spoke to [employer] RN with [employer]. They were following pt for [facility] PT post last d/c and pt's insurance changed. She was set up with [facility] and plans to go home with [place], fax [phone] at d/c. Will need new orders for [facility] at d/c. CM will continue to follow up with pt until d/c. Isidoro Donning RN CCM Case Mgmt phone [phone]

## 2011-08-26 NOTE — Discharge Summary (Signed)
Patient ID: Donna Ortega MRN: [id number] DOB/AGE: 1926-02-25 78 y.o. Primary Care Physician:BOUSKA,DAVID E, MD, MD Admit date: 08/20/2011 Discharge date: 08/26/2011    Discharge Diagnoses:  Acute on chronic hyponatremia leading to encephalopathy Volume overload, probably related to the prior admission as well as the patient's propensity to drink a lot of fluids Chronic hyponatremia most likely due to combination of SIADH and potomania  CAD (coronary artery disease)  Atrial fibrillation with RVR  HTN (hypertension)  Anemia associated with acute blood loss  UTI (lower urinary tract infection)   Medication List  As of 08/26/2011  5:08 PM   START taking these medications         furosemide 20 MG tablet   Commonly known as: LASIX   Take 1 tablet (20 mg total) by mouth daily.      potassium chloride SA 20 MEQ tablet   Commonly known as: K-DUR,KLOR-CON   Take 1 tablet (20 mEq total) by mouth daily.         CHANGE how you take these medications         acetaminophen 500 MG tablet   Commonly known as: TYLENOL   Take 1 tablet (500 mg total) by mouth 4 (four) times daily.   What changed: - medication strength - dose - how often to take the med - reasons to take the med         CONTINUE taking these medications         amiodarone 400 MG tablet   Commonly known as: PACERONE   Take 0.5 tablets (200 mg total) by mouth 2 (two) times daily.      CALTRATE 600+D PO      diltiazem 240 MG 24 hr capsule   Commonly known as: CARDIZEM CD   Take 1 capsule (240 mg total) by mouth daily.      lisinopril 40 MG tablet   Commonly known as: PRINIVIL,ZESTRIL      metoprolol 50 MG tablet   Commonly known as: LOPRESSOR   Take 1 tablet (50 mg total) by mouth 2 (two) times daily.      multivitamin tablet      omeprazole 20 MG tablet   Commonly known as: PRILOSEC OTC      PRADAXA 150 MG Caps   Generic drug: dabigatran      pravastatin 40 MG tablet   Commonly known as: PRAVACHOL          Where to get your medications    These are the prescriptions that you need to pick up. We sent them to a specific pharmacy, so you will need to go there to get them.   [id number] - [place], [place] - [address] AT [facility] [address]    [address] [place] [address]    Phone: [phone]        acetaminophen 500 MG tablet         You may get these medications from any pharmacy.         furosemide 20 MG tablet   potassium chloride SA 20 MEQ tablet            Discharged Condition: good    Consults:nephrology   Significant Diagnostic Studies: Dg Chest 2 View  08/20/2011  *RADIOLOGY REPORT*  Clinical Data: 78 year old female with weakness.  Recent L1 kyphoplasty.  CHEST - 2 VIEW  Comparison: 08/03/2011 and prior chest radiographs  Findings: Cardiomegaly is  noted with pulmonary vascular congestion. Small bilateral pleural effusions, right greater than left, noted. There is no evidence of airspace disease, pneumothorax or definite edema. T10 and L3 compression fractures are again identified. L1 compression fracture and augmentation changes are present.  IMPRESSION: Cardiomegaly with pulmonary vascular congestion and small bilateral pleural effusions.  L1 compression fracture and augmentation changes.  Original Report Authenticated By: Rosendo Gros, M.D.   Dg Lumbar Spine 2-3 Views  08/05/2011  *RADIOLOGY REPORT*  Clinical Data: Back pain.  LUMBAR SPINE - 2-3 VIEW  Comparison: None.  Findings: Severe compression fractures are noted through the T10, L1, and L3 vertebral bodies, age indeterminate.  Slight compression fracture is likely present in the superior endplate of T12.  There is diffuse osteopenia.  Slight anterolisthesis of L4 on L5 likely related to mild facet disease.  Aorta is calcified, non-aneurysmal. SI joints are symmetric and unremarkable.  IMPRESSION: Severe T10, L1 and L3 compression fractures, age indeterminate. Suspect  slight compression through the superior endplate of T12.  Original Report Authenticated By: Cyndie Chime, M.D.   Dg Abd 1 View  08/03/2011  *RADIOLOGY REPORT*  Clinical Data: Rule out free abdominal air  ABDOMEN - 1 VIEW  Comparison: Upright view of the chest same day  Findings: There is a nonspecific nonobstructive bowel gas pattern. Moderate stool and gas noted throughout the colon.  No free abdominal air is noted on the upright view of the chest.  IMPRESSION: Nonspecific nonobstructive bowel gas pattern.  Moderate stool and gas noted throughout the colon.  Original Report Authenticated By: Natasha Mead, M.D.   Dg Chest Port 1 View  08/03/2011  *RADIOLOGY REPORT*  Clinical Data: Chest pain  PORTABLE CHEST - 1 VIEW  Comparison: 10/03/2005  Findings: Cardiomediastinal silhouette is stable.  Status post median sternotomy again noted.  No acute infiltrate or pleural effusion.  No pulmonary edema.  IMPRESSION: No active disease.  Status post median sternotomy again noted.  Original Report Authenticated By: Natasha Mead, M.D.   Ir Vertebroplasty Or Sacroplasty  08/08/2011  *RADIOLOGY REPORT*  Clinical Data:  Severe low back pain secondary to compression fracture at L1.  KYPHOPLASTY AT L1  Comparison:  MRI scan of the lumbosacral spine of a few days ago from outside institution.  Following a full explanation of the procedure along with the potential associated complications, an informed witnessed consent was obtained.  The patient was placed prone on the fluoroscopic table.  The skin overlying the lumbar region was then prepped and draped in the usual sterile fashion.  The right pedicle at L1 was then infiltrated with 0.25% bupivacaine, followed by the advancement of an 11-gauge Jamshidi needle into the posterior third at L1.  This was then exchanged out for the Kyphon advanced osteo introducer system comprised of a working cannula and a Kyphon osteo drill over a Kyphon osteo bone pin.  This combination was then  advanced until the tip of the Kyphon osteo drill was in the posterior third.  At this time the bone pin was removed.  In a medial trajectory, the combination was advanced until the tip of the working cannula was in the posterior third at L1.  Crossing of the midline by the osteo drill was seen.  The osteo drill was then removed and a core sample sent for pathologic analysis.  Through the working cannula, the Kyphon bone biopsy device was then advanced within 5 mm of the anterior aspect of L1.  A core sample from this was also  sent for pathologic analysis.  Through the working cannula, a Kyphon inflatable bone tamp 20 x 3 was advanced and positioned with the distal markers 7 mm from the anterior aspect of L1.  This was then expanded with contrast using the Kyphon inflation syringe device via microtubing until there was apposition with the superior and the inferior endplates.  At this time methylmethacrylate mixture was reconstituted with Tobramycin in the Kyphon bone mixing system.  This was then loaded onto Kyphon bone fillers.  The balloon was deflated and removed followed by the instillation of 3.5 bone filler equivalents of methylmethacrylate mixture at L1 with excellent filling in the AP and lateral projections.  There was no extrusion noted into disc spaces or posteriorly into the spinal canal.  No paraspinous venous examination was seen.  The working cannula and the bone filler were then removed. Hemostasis was achieved at the skin entry site.  The patient tolerated the procedure well.  There were no acute complications.  Medications Utilized:  Versed 4 mg IV.  Fentanyl 100 mcg IV.  Ancef 1 gram IV.  IMPRESSION: 1. Status post fluoroscopic-guided needle placement for deep core bone biopsy, L1. 2. Status post vertebral body augmentation for painful osteoporotic compression fracture at L1 using the balloon kyphoplasty technique.  Original Report Authenticated By: Oneal Grout, M.D.    Lab  Results: Results for orders placed during the hospital encounter of 08/20/11 (from the past 48 hour(s))  BASIC METABOLIC PANEL     Status: Abnormal   Collection Time   08/25/11  5:10 AM      Component Value Range Comment   Sodium 132 (*) 135 - 145 (mEq/L) DELTA CHECK NOTED   Potassium 3.8  3.5 - 5.1 (mEq/L)    Chloride 99  96 - 112 (mEq/L)    CO2 25  19 - 32 (mEq/L)    Glucose, Bld 103 (*) 70 - 99 (mg/dL)    BUN 13  6 - 23 (mg/dL)    Creatinine, Ser 1.19  0.50 - 1.10 (mg/dL) ICTERUS AT THIS LEVEL MAY AFFECT RESULT   Calcium 8.5  8.4 - 10.5 (mg/dL)    GFR calc non Af Amer 66 (*) >90 (mL/min)    GFR calc Af Amer 77 (*) >90 (mL/min)    Recent Results (from the past 240 hour(s))  URINE CULTURE     Status: Normal   Collection Time   08/20/11  8:00 PM      Component Value Range Status Comment   Specimen Description URINE, CLEAN CATCH   Final    Special Requests NONE   Final    Setup Time [id number]   Final    Colony Count 55,000 COLONIES/ML   Final    Culture     Final    Value: Multiple bacterial morphotypes present, none predominant. Suggest appropriate recollection if clinically indicated.   Report Status 08/21/2011 FINAL   Final   CLOSTRIDIUM DIFFICILE BY PCR     Status: Normal   Collection Time   08/22/11  7:39 AM      Component Value Range Status Comment   C difficile by pcr NEGATIVE  NEGATIVE  Final      Hospital Course:  78 year old woman with chronic hyponatremia had a recent admission due to severe back pain after a compression fracture. She was discharged home on August 14, 2011, and the family had to bring her back on August 21, 2011. She was having confusion and increased weakness. She was found to  have a sodium level of 118. She was admitted in the hospital and placed on fluid restriction. On January the seventh 2013 she was seen in consultation by nephrology and they recommended the addition of Lasix. She had gradual improvement in her hyponatremia which closely  correlated with improvement in her mental status and increase in  her strength.   Discharge Exam: Blood pressure 109/67, pulse 55, temperature 98.2 F (36.8 C), temperature source Oral, resp. rate 20, height 5\' 4"  (1.626 m), weight 64.184 kg (141 lb 8 oz), SpO2 96.00%. Alert oriented x3 Cvs: rrr Rs: ctab Abdomen: soft, nt  Disposition: Home with family  Discharge Orders    Future Orders Please Complete By Expires   Diet - low sodium heart healthy      Increase activity slowly      Other Restrictions      Comments:   Fluid restriction 1200 ml/24 hrs      Follow-up Information    Follow up with Aura Dials, MD.   Contact information:   [address] [place] [address] [phone]       Follow up with GIOFFRE,RONALD A. Call in 1 week.   Contact information:   [facility] [address] [place] [address] [phone]          Signed: Lonia Blood 08/26/2011, 5:08 PM

## 2011-08-31 ENCOUNTER — Ambulatory Visit (INDEPENDENT_AMBULATORY_CARE_PROVIDER_SITE_OTHER): Payer: PRIVATE HEALTH INSURANCE | Admitting: Cardiovascular Disease

## 2011-08-31 ENCOUNTER — Encounter: Payer: Self-pay | Admitting: Cardiovascular Disease

## 2011-08-31 DIAGNOSIS — I2510 Atherosclerotic heart disease of native coronary artery without angina pectoris: Secondary | ICD-10-CM

## 2011-08-31 DIAGNOSIS — I4891 Unspecified atrial fibrillation: Secondary | ICD-10-CM

## 2011-08-31 LAB — LIPID PANEL
Cholesterol: 177 mg/dL (ref 0–200)
HDL: 86.5 mg/dL (ref >39.00)
VLDL: 9.2 mg/dL (ref 0.0–40.0)

## 2011-08-31 LAB — BASIC METABOLIC PANEL
BUN: 20 mg/dL (ref 6–23)
Calcium: 8.8 mg/dL (ref 8.4–10.5)
GFR: 56.88 mL/min — ABNORMAL LOW (ref >60.00)
Glucose, Bld: 82 mg/dL (ref 70–99)

## 2011-08-31 LAB — HEPATIC FUNCTION PANEL
AST: 24 U/L (ref 0–37)
Albumin: 4.1 g/dL (ref 3.5–5.2)

## 2011-08-31 MED ORDER — AMIODARONE HCL 200 MG PO TABS: 200.0000 mg | ORAL_TABLET | Freq: Every day | ORAL | Status: DC

## 2011-08-31 NOTE — Assessment & Plan Note (Signed)
Donna Ortega is doing quite well. She remains in normal sinus rhythm.  We will reduce her amiodarone to 200 mg once a day.

## 2011-08-31 NOTE — Assessment & Plan Note (Signed)
She has not had any episodes of angina. 

## 2011-08-31 NOTE — Progress Notes (Signed)
Donna Ortega Date of Birth  [birth year] [employer]     [place] Office  [address]    [address]   [address] [place], [place]  [address]    [place], [place]  [address] [phone]  Fax  [phone]  [phone]  Fax [phone]   History of Present Illness:  Donna Ortega is an 78 year old female with a history of coronary artery disease/coronary artery bypass grafting. She was recently admitted to the hospital with a GI bleed and anemia. She was also found to have atrial fibrillation with a rapid ventricular response. She was also found to have significant hyponatremia.  During the hospitalization she was also found to have a compression fracture of her back and is now status post kyphoplasty. She seems to be doing quite a bit better. Her strength is better.  Current Outpatient Prescriptions on File Prior to Visit  Medication Sig Dispense Refill  . amiodarone (PACERONE) 400 MG tablet Take 0.5 tablets (200 mg total) by mouth 2 (two) times daily.  60 tablet  0  . Calcium Carbonate-Vitamin D (CALTRATE 600+D PO) Take 1 tablet by mouth daily.       . dabigatran (PRADAXA) 150 MG CAPS Take 150 mg by mouth every 12 (twelve) hours.        Marland Kitchen diltiazem (CARDIZEM CD) 240 MG 24 hr capsule Take 1 capsule (240 mg total) by mouth daily.  40 capsule  0  . furosemide (LASIX) 20 MG tablet Take 1 tablet (20 mg total) by mouth daily.  30 tablet  0  . lisinopril (PRINIVIL,ZESTRIL) 40 MG tablet Take 40 mg by mouth daily.        . metoprolol (LOPRESSOR) 50 MG tablet Take 1 tablet (50 mg total) by mouth 2 (two) times daily.  60 tablet  0  . Multiple Vitamin (MULTIVITAMIN) tablet Take 1 tablet by mouth daily.        Marland Kitchen omeprazole (PRILOSEC OTC) 20 MG tablet Take 40 mg by mouth 2 (two) times daily.        . potassium chloride SA (K-DUR,KLOR-CON) 20 MEQ tablet Take 1 tablet (20 mEq total) by mouth daily.  30 tablet  0  . pravastatin (PRAVACHOL) 40 MG tablet Take 40 mg by mouth daily.        Marland Kitchen  acetaminophen (TYLENOL) 500 MG tablet Take 1 tablet (500 mg total) by mouth 4 (four) times daily.  30 tablet  0    Allergies  Allergen Reactions  . Lipitor (Atorvastatin Calcium)     MUSCLE ACHES  . Niaspan (Niacin (Antihyperlipidemic)) Rash    Past Medical History  Diagnosis Date  . Coronary artery disease 2007    3 vessel CABG with LIMA-LAD, SVG to diag, and SVG - Ramus  . Hyperlipidemia   . Hypertension   . Atrial fibrillation     paroxysmal  . History of hyperkalemia   . Hyponatremia   . Diverticulitis     Past Surgical History  Procedure Date  . Maze   . Coronary artery bypass graft   . Partial hysterectomy   . Esophagogastroduodenoscopy 08/04/2011    Procedure: ESOPHAGOGASTRODUODENOSCOPY (EGD);  Surgeon: Florencia Reasons, MD;  Location: [facility] ENDOSCOPY;  Service: Endoscopy;  Laterality: N/A;  do at bedside  . Abdominal hysterectomy     History  Smoking status  . Never Smoker   Smokeless tobacco  . Not on file    History  Alcohol Use No    Family History  Problem Relation Age of Onset  . Heart failure Father   . Kidney failure Father   . Bone cancer Brother   . Kidney failure Sister   . Colon cancer Sister   . Cancer Sister     Reviw of Systems:  Reviewed in the HPI.  All other systems are negative.  Physical Exam: BP 157/73  Pulse 65  Ht 5\' 4"  (1.626 m)  Wt 133 lb 12.8 oz (60.691 kg)  BMI 22.97 kg/m2 The patient is alert and oriented x 3.  The mood and affect are normal.   Skin: warm and dry.  Color is normal.    HEENT:   [place]/AT, mucmus membranes are moist, neck is supple  Lungs: clear   Heart: RR,     Abdomen: slightly distended  Extremities:  No edema  Neuro:  Non focal    ECG:   Assessment / Plan:

## 2011-08-31 NOTE — Patient Instructions (Addendum)
Your physician recommends that you return for lab work in: TODAY/ BMET  Your physician wants you to follow-up in: 3 MONTHS You will receive a reminder letter in the mail two months in advance. If you don't receive a letter, please call our office to schedule the follow-up appointment.  Your physician has recommended you make the following change in your medication:   REDUCE AMIODARONE TO 200 MG ONCE DAILY/ I SENT NEW SCRIPT FOR NEW DOSE.

## 2011-09-13 ENCOUNTER — Telehealth: Payer: Self-pay | Admitting: Cardiovascular Disease

## 2011-09-13 NOTE — Telephone Encounter (Signed)
I doubt the amiodarone is the cause of her rash.  Continue for now

## 2011-09-13 NOTE — Telephone Encounter (Signed)
Please advise; pt having hives for 1.5 weeks, red itchy spots all over. Saw PCP for this and he gave a steroid cream. Pt stopped amiodarone three or four days ago on her own thinking it may be that med but hives continue, told pt to go back on med and not to stop cardiac meds without Dr advising, pt agreed, can you make any further suggestions?

## 2011-09-13 NOTE — Telephone Encounter (Signed)
New Problem   Patient would like a return call as she thinks she may be having a itching reaction to some of her medication.  Please return call to patient at hm#

## 2011-09-14 ENCOUNTER — Telehealth: Payer: Self-pay | Admitting: Cardiovascular Disease

## 2011-09-14 ENCOUNTER — Other Ambulatory Visit: Payer: Self-pay | Admitting: Gastroenterology

## 2011-09-14 ENCOUNTER — Encounter: Payer: Self-pay | Admitting: Cardiovascular Disease

## 2011-09-14 NOTE — Telephone Encounter (Signed)
New Msg; Pt daughter in law calling wanting to speak with nurse regarding amiodarone causing rash/hives all over pt body. Pt was told to continue taking medication-after speaking with nurse yesterday regarding pt rash.    Pt daugther in law is nurse and wanted to discuss this with nurse about possible substitutions. Please return pt daughter in law call to discuss further.

## 2011-09-14 NOTE — Telephone Encounter (Signed)
LET HER KNOW PT WAS CALLED THIS AM AND DR Elease Hashimoto DIDN'T THINK IT WAS THE AMIODORONE, FAMILY AWARE AND WILL F/U WITH PCP

## 2011-09-14 NOTE — Telephone Encounter (Signed)
msg left and advised to f/u pcp. Continue amiodarone.

## 2011-09-15 ENCOUNTER — Telehealth: Payer: Self-pay | Admitting: Cardiovascular Disease

## 2011-09-15 NOTE — Telephone Encounter (Signed)
Hold amiodarone for now.  She should come in and see someone

## 2011-09-15 NOTE — Telephone Encounter (Signed)
PT CALLED TO STOP MED/ APP SET FOR F/U

## 2011-09-15 NOTE — Telephone Encounter (Signed)
Pt calling re rash all over body, itching badly

## 2011-09-15 NOTE — Telephone Encounter (Signed)
Patient called complaining of welps and a bad rash all over her body.Stated she stopped amiodarone for 2 days and rash was starting to get better.States she called office yesterday and was told to restart amiodarone.States she took amiodarone last night and did not sleep with welps and itching.

## 2011-09-19 ENCOUNTER — Ambulatory Visit (INDEPENDENT_AMBULATORY_CARE_PROVIDER_SITE_OTHER): Payer: Medicare Other | Admitting: Cardiovascular Disease

## 2011-09-19 ENCOUNTER — Encounter: Payer: Self-pay | Admitting: Cardiovascular Disease

## 2011-09-19 DIAGNOSIS — R21 Rash and other nonspecific skin eruption: Secondary | ICD-10-CM

## 2011-09-19 DIAGNOSIS — I4891 Unspecified atrial fibrillation: Secondary | ICD-10-CM

## 2011-09-19 NOTE — Assessment & Plan Note (Addendum)
Halei presents with a  rash appears to be due to a reaction from a new medication. She's currently on amiodarone, furosemide, and diltiazem CD which are all new from her previous office visit. I discussed the case with a clinical pharmacist and she also thinks that the amiodarone is the most likely cause.  She'll continue to use the cortisone cream. She wondered about seeing a dermatologist and I agree that this may be of some use.

## 2011-09-19 NOTE — Assessment & Plan Note (Signed)
She remains in normal sinus rhythm from a clinical standpoint. We will need to hold the amiodarone. I suspect that she's having a drug reaction to her amiodarone. Portion, she still in sinus rhythm.  I discussed the case with a clinical pharmacist. She thinks it the amiodarone is also most likely cause of her rash. We'll continue to hold it and followup with her.

## 2011-09-19 NOTE — Progress Notes (Signed)
[name] Date of Birth  1926/04/22 [facility]     [place] Office  [address]    [address]   [address] [place], [place]  [address]    [place], [place]  [address] [phone]  Fax  [phone]  [phone]  Fax [phone]   CAD Atrial Fibrillation Drug Rash GI Bleed Anemia   History of Present Illness:  Donna Ortega is an 78 year old female with a history of coronary artery disease and atrial fibrillation. She was admitted to the hospital in December with a GI bleed, anemia, and rapid atrial fibrillation. She was discharged with the additional medications of amiodarone and Cardizem CD.  She was seen in mid January and was doing fairly well.  She follows up today and has developed a red rash across both arms, her chest, and back. It appears to be consistent with a drug rash. She stopped the amiodarone last week.  Current Outpatient Prescriptions on File Prior to Visit  Medication Sig Dispense Refill  . Calcium Carbonate-Vitamin D (CALTRATE 600+D PO) Take 1 tablet by mouth daily.       . dabigatran (PRADAXA) 150 MG CAPS Take 150 mg by mouth every 12 (twelve) hours.        Marland Kitchen diltiazem (CARDIZEM CD) 240 MG 24 hr capsule Take 1 capsule (240 mg total) by mouth daily.  40 capsule  0  . furosemide (LASIX) 20 MG tablet Take 1 tablet (20 mg total) by mouth daily.  30 tablet  0  . lisinopril (PRINIVIL,ZESTRIL) 40 MG tablet Take 40 mg by mouth daily.        . metoprolol (LOPRESSOR) 50 MG tablet Take 1 tablet (50 mg total) by mouth 2 (two) times daily.  60 tablet  0  . Multiple Vitamin (MULTIVITAMIN) tablet Take 1 tablet by mouth daily.        Marland Kitchen omeprazole (PRILOSEC OTC) 20 MG tablet Take 40 mg by mouth 2 (two) times daily.        . potassium chloride SA (K-DUR,KLOR-CON) 20 MEQ tablet Take 1 tablet (20 mEq total) by mouth daily.  30 tablet  0  . pravastatin (PRAVACHOL) 40 MG tablet Take 40 mg by mouth daily.        Marland Kitchen amiodarone (PACERONE) 200 MG tablet Take 1 tablet  (200 mg total) by mouth daily.  30 tablet  5    Allergies  Allergen Reactions  . Lipitor (Atorvastatin Calcium)     MUSCLE ACHES  . Niaspan (Niacin (Antihyperlipidemic)) Rash    Past Medical History  Diagnosis Date  . Coronary artery disease 2007    3 vessel CABG with LIMA-LAD, SVG to diag, and SVG - Ramus  . Hyperlipidemia   . Hypertension   . Atrial fibrillation     paroxysmal  . History of hyperkalemia   . Hyponatremia   . Diverticulitis     Past Surgical History  Procedure Date  . Maze   . Coronary artery bypass graft   . Partial hysterectomy   . Esophagogastroduodenoscopy 08/04/2011    Procedure: ESOPHAGOGASTRODUODENOSCOPY (EGD);  Surgeon: Florencia Reasons, MD;  Location[facility] ENDOSCOPY;  Service: Endoscopy;  Laterality: N/A;  do at bedside  . Abdominal hysterectomy     History  Smoking status  . Never Smoker   Smokeless tobacco  . Not on file    History  Alcohol Use No    Family History  Problem Relation Age of Onset  . Heart failure Father   .  Kidney failure Father   . Bone cancer Brother   . Kidney failure Sister   . Colon cancer Sister   . Cancer Sister     Reviw of Systems:  Reviewed in the HPI.  All other systems are negative.  Physical Exam: Blood pressure 156/70, pulse 52, height 5\' 4"  (1.626 m), weight 132 lb 3.2 oz (59.966 kg). General: Well developed, well nourished, in no acute distress.  Head: Normocephalic, atraumatic, sclera non-icteric, mucus membranes are moist,   Neck: Supple. Negative for carotid bruits. JVD not elevated.  Lungs: Clear bilaterally to auscultation without wheezes, rales, or rhonchi. Breathing is unlabored.  Heart: RRR with S1 S2. No murmurs, rubs, or gallops appreciated.  Abdomen: Soft, non-tender, non-distended with normoactive bowel sounds. No hepatomegaly. No rebound/guarding. No obvious abdominal masses.  Msk:  Strength and tone appear normal for age.  Extremities: No clubbing or cyanosis. No edema.   Distal pedal pulses are 2+ and equal bilaterally.  Neuro: Alert and oriented X 3. Moves all extremities spontaneously.  Psych:  Responds to questions appropriately with a normal affect.  Skin:  She has a reddish, scaley rash on her arms, legs, trunk and a slight rash on her back.  ECG:  Assessment / Plan:

## 2011-09-19 NOTE — Patient Instructions (Signed)
Your physician recommends that you schedule a follow-up appointment in: 3 MONTHS/ EKG/ LAB DRAW NON FASTING./ BMET  You have been referred to DERMATOLOGY  Your physician has recommended you make the following change in your medication:   STOP AMIODARONE / PACE ON ALLERGY LIST

## 2011-09-26 ENCOUNTER — Other Ambulatory Visit (HOSPITAL_COMMUNITY): Payer: Self-pay | Admitting: Internal Medicine

## 2011-09-27 DIAGNOSIS — I10 Essential (primary) hypertension: Secondary | ICD-10-CM | POA: Insufficient documentation

## 2011-09-28 ENCOUNTER — Other Ambulatory Visit: Payer: Self-pay | Admitting: *Deleted

## 2011-09-28 MED ORDER — FUROSEMIDE 20 MG PO TABS: 20.0000 mg | ORAL_TABLET | Freq: Every day | ORAL | Status: DC

## 2011-10-14 ENCOUNTER — Other Ambulatory Visit: Payer: Self-pay | Admitting: *Deleted

## 2011-10-14 MED ORDER — METOPROLOL TARTRATE 50 MG PO TABS: 50.0000 mg | ORAL_TABLET | Freq: Two times a day (BID) | ORAL | Status: DC

## 2011-10-14 NOTE — Telephone Encounter (Signed)
Fax Received. Refill Completed. Donna Ortega (R.M.A)   

## 2011-10-31 ENCOUNTER — Telehealth: Payer: Self-pay | Admitting: Cardiovascular Disease

## 2011-10-31 NOTE — Telephone Encounter (Signed)
New msg Pt's son called about samples of pradaxa. Please let him know if have any.

## 2011-10-31 NOTE — Telephone Encounter (Signed)
msg called/samples available for pick up.

## 2011-11-18 ENCOUNTER — Other Ambulatory Visit: Payer: Self-pay | Admitting: *Deleted

## 2011-11-18 MED ORDER — PRAVASTATIN SODIUM 40 MG PO TABS: 40.0000 mg | ORAL_TABLET | Freq: Every day | ORAL | Status: DC

## 2011-11-24 ENCOUNTER — Telehealth: Payer: Self-pay | Admitting: *Deleted

## 2011-11-24 NOTE — Telephone Encounter (Signed)
11/24/11--pt's daughter calling for pradaxa samples--LM on identified v.m. That i have placed samples at front desk and all she needs to do is come and pic up--nt

## 2011-11-24 NOTE — Telephone Encounter (Signed)
New problem:  Per Amy calling , need Sample of praxada for her mother in law.

## 2011-12-19 ENCOUNTER — Ambulatory Visit: Payer: PRIVATE HEALTH INSURANCE | Admitting: Cardiovascular Disease

## 2011-12-26 ENCOUNTER — Ambulatory Visit (INDEPENDENT_AMBULATORY_CARE_PROVIDER_SITE_OTHER): Payer: Medicare Other | Admitting: *Deleted

## 2011-12-26 ENCOUNTER — Encounter: Payer: Self-pay | Admitting: Cardiovascular Disease

## 2011-12-26 ENCOUNTER — Ambulatory Visit (INDEPENDENT_AMBULATORY_CARE_PROVIDER_SITE_OTHER): Payer: Medicare Other | Admitting: Cardiovascular Disease

## 2011-12-26 VITALS — BP 160/80 | HR 52 | Ht 64.0 in | Wt 131.4 lb

## 2011-12-26 DIAGNOSIS — I10 Essential (primary) hypertension: Secondary | ICD-10-CM

## 2011-12-26 DIAGNOSIS — I4891 Unspecified atrial fibrillation: Secondary | ICD-10-CM

## 2011-12-26 DIAGNOSIS — I2510 Atherosclerotic heart disease of native coronary artery without angina pectoris: Secondary | ICD-10-CM

## 2011-12-26 DIAGNOSIS — R5381 Other malaise: Secondary | ICD-10-CM

## 2011-12-26 DIAGNOSIS — R5383 Other fatigue: Secondary | ICD-10-CM

## 2011-12-26 LAB — BASIC METABOLIC PANEL
BUN: 35 mg/dL — ABNORMAL HIGH (ref 6–23)
GFR: 58.89 mL/min — ABNORMAL LOW (ref >60.00)
Glucose, Bld: 84 mg/dL (ref 70–99)
Potassium: 4.3 mEq/L (ref 3.5–5.1)

## 2011-12-26 NOTE — Assessment & Plan Note (Signed)
Donna Ortega is still in normal sinus rhythm. She discontinue the amiodarone on her own as a possible drug rash.  She's had a Maze procedure.  We'll continue with the Pradaxa.

## 2011-12-26 NOTE — Assessment & Plan Note (Addendum)
Her blood pressures little elevated today but if it was elevated in the office. We will make sure that she's taking her blood pressure at home.  I will see her back in 3 months for followup visit.     She's having lots of problems with generalized fatigue. She has normal left ventricle systolic function. I'm not sure that she actually needs to take Lasix and potassium on a daily basis-especially if she can limit her salt intake.  We will discontinue the Lasix and potassium at this point is if this makes her fatigue better.  We've given her some instructions on the DASH diet.

## 2011-12-26 NOTE — Patient Instructions (Addendum)
Your physician recommends that you schedule a follow-up appointment in: 3 months  Your physician has recommended you make the following change in your medication:  STOP AMIODONE STOP LASIX STOP POTASSIUM  PLEASE CHECK YOUR BLOOD PRESSURE AT HOME DAILY 1-2 HOURS AFTER MEDICATIONS  AVOID SALTY FOODS LIKE CANNED SOUP, READY PREPARED FROZEN FOOD LIKE LEAN CUISINE OR LASSAGNA, NO BACON, SAUSAGE, LUNCH MEATS ALL HIGH IN SODIUM  DASH Diet The DASH diet stands for "Dietary Approaches to Stop Hypertension." It is a healthy eating plan that has been shown to reduce high blood pressure (hypertension) in as little as 14 days, while also possibly providing other significant health benefits. These other health benefits include reducing the risk of breast cancer after menopause and reducing the risk of type 2 diabetes, heart disease, colon cancer, and stroke. Health benefits also include weight loss and slowing kidney failure in patients with chronic kidney disease.  DIET GUIDELINES  Limit salt (sodium). Your diet should contain less than 1500 mg of sodium daily.   Limit refined or processed carbohydrates. Your diet should include mostly whole grains. Desserts and added sugars should be used sparingly.   Include small amounts of heart-healthy fats. These types of fats include nuts, oils, and tub margarine. Limit saturated and trans fats. These fats have been shown to be harmful in the body.  CHOOSING FOODS  The following food groups are based on a 2000 calorie diet. See your Registered Dietitian for individual calorie needs. Grains and Grain Products (6 to 8 servings daily)  Eat More Often: Whole-wheat bread, brown rice, whole-grain or wheat pasta, quinoa, popcorn without added fat or salt (air popped).   Eat Less Often: White bread, white pasta, white rice, cornbread.  Vegetables (4 to 5 servings daily)  Eat More Often: Fresh, frozen, and canned vegetables. Vegetables may be raw, steamed, roasted, or  grilled with a minimal amount of fat.   Eat Less Often/Avoid: Creamed or fried vegetables. Vegetables in a cheese sauce.  Fruit (4 to 5 servings daily)  Eat More Often: All fresh, canned (in natural juice), or frozen fruits. Dried fruits without added sugar. One hundred percent fruit juice ( cup [237 mL] daily).   Eat Less Often: Dried fruits with added sugar. Canned fruit in light or heavy syrup.  [employer], Fish, and Poultry (2 servings or less daily. One serving is 3 to 4 oz [85-114 g]).  Eat More Often: Ninety percent or leaner ground beef, tenderloin, sirloin. Round cuts of beef, chicken breast, [place] breast. All fish. Grill, bake, or broil your meat. Nothing should be fried.   Eat Less Often/Avoid: Fatty cuts of meat, [place], or chicken leg, thigh, or wing. Fried cuts of meat or fish.  Dairy (2 to 3 servings)  Eat More Often: Low-fat or fat-free milk, low-fat plain or light yogurt, reduced-fat or part-skim cheese.   Eat Less Often/Avoid: Milk (whole, 2%, skim, or chocolate).Whole milk yogurt. Full-fat cheeses.  Nuts, Seeds, and Legumes (4 to 5 servings per week)  Eat More Often: All without added salt.   Eat Less Often/Avoid: Salted nuts and seeds, canned beans with added salt.  Fats and Sweets (limited)  Eat More Often: Vegetable oils, tub margarines without trans fats, sugar-free gelatin. Mayonnaise and salad dressings.   Eat Less Often/Avoid: Coconut oils, palm oils, butter, stick margarine, cream, half and half, cookies, candy, pie.  FOR MORE INFORMATION The Dash Diet Eating Plan: www.dashdiet.org Document Released: 07/21/2011 Document Reviewed: 07/11/2011 [facility] Patient Information 2012 [place], [place].

## 2011-12-26 NOTE — Progress Notes (Signed)
[name] Date of Birth  1926/03/15 [facility]     [place] Office  [address]    [address]   [address] [place], [place]  [address]    [place], [place]  [address] [phone]  Fax  [phone]  [phone]  Fax [phone]  Problems:  1. CAD 2. Atrial Fibrillation 3. Drug Rash 4. GI Bleed 5. Anemia   History of Present Illness:  Donna Ortega is an 78 year old female with a history of coronary artery disease and atrial fibrillation. She was admitted to the hospital in December with a GI bleed, anemia, and rapid atrial fibrillation. She was discharged with the additional medications of amiodarone and Cardizem CD.  She was seen in mid January and was doing fairly well.  She stopped her Amiodarone due to drug rash.  She still has generalized fatigue and generalized weakness. She wonders if it may be due to the Lasix and potassium.  Current Outpatient Prescriptions on File Prior to Visit  Medication Sig Dispense Refill  . amiodarone (PACERONE) 200 MG tablet Take 200 mg by mouth daily.      . Calcium Carbonate-Vitamin D (CALTRATE 600+D PO) Take 1 tablet by mouth daily.       . dabigatran (PRADAXA) 150 MG CAPS Take 150 mg by mouth every 12 (twelve) hours.        Marland Kitchen diltiazem (CARDIZEM CD) 240 MG 24 hr capsule Take 1 capsule (240 mg total) by mouth daily.  40 capsule  0  . furosemide (LASIX) 20 MG tablet Take 1 tablet (20 mg total) by mouth daily.  30 tablet  6  . lisinopril (PRINIVIL,ZESTRIL) 40 MG tablet Take 40 mg by mouth daily.        . metoprolol (LOPRESSOR) 50 MG tablet Take 1 tablet (50 mg total) by mouth 2 (two) times daily.  60 tablet  5  . Multiple Vitamin (MULTIVITAMIN) tablet Take 1 tablet by mouth daily.        Marland Kitchen omeprazole (PRILOSEC OTC) 20 MG tablet Take 40 mg by mouth 2 (two) times daily.        . potassium chloride SA (K-DUR,KLOR-CON) 20 MEQ tablet Take 1 tablet (20 mEq total) by mouth daily.  30 tablet  0  . pravastatin (PRAVACHOL) 40 MG tablet  Take 1 tablet (40 mg total) by mouth daily.  30 tablet  7    Allergies  Allergen Reactions  . Amiodarone Hives    ALL OVER BODY HIVES/ ITCH  . Lipitor (Atorvastatin Calcium)     MUSCLE ACHES  . Niaspan (Niacin Er (Antihyperlipidemic)) Rash    Past Medical History  Diagnosis Date  . Coronary artery disease 2007    3 vessel CABG with LIMA-LAD, SVG to diag, and SVG - Ramus  . Hyperlipidemia   . Hypertension   . Atrial fibrillation     paroxysmal  . History of hyperkalemia   . Hyponatremia   . Diverticulitis     Past Surgical History  Procedure Date  . Maze   . Coronary artery bypass graft   . Partial hysterectomy   . Esophagogastroduodenoscopy 08/04/2011    Procedure: ESOPHAGOGASTRODUODENOSCOPY (EGD);  Surgeon: Florencia Reasons, MD;  Location: [facility] ENDOSCOPY;  Service: Endoscopy;  Laterality: N/A;  do at bedside  . Abdominal hysterectomy     History  Smoking status  . Never Smoker   Smokeless tobacco  . Not on file    History  Alcohol Use No    Family  History  Problem Relation Age of Onset  . Heart failure Father   . Kidney failure Father   . Bone cancer Brother   . Kidney failure Sister   . Colon cancer Sister   . Cancer Sister     Reviw of Systems:  Reviewed in the HPI.  All other systems are negative.  Physical Exam: Blood pressure 160/80, pulse 52, height 5\' 4"  (1.626 m), weight 131 lb 6.4 oz (59.603 kg). General: Well developed, well nourished, in no acute distress.  Head: Normocephalic, atraumatic, sclera non-icteric, mucus membranes are moist,   Neck: Supple. Negative for carotid bruits. JVD not elevated.  Lungs: Clear bilaterally to auscultation without wheezes, rales, or rhonchi. Breathing is unlabored.  Heart: RRR with S1 S2. No murmurs, rubs, or gallops appreciated.  Abdomen: Soft, non-tender, non-distended with normoactive bowel sounds. No hepatomegaly. No rebound/guarding. No obvious abdominal masses.  Msk:  Strength and tone appear  normal for age.  Extremities: No clubbing or cyanosis. No edema.  Distal pedal pulses are 2+ and equal bilaterally.  Neuro: Alert and oriented X 3. Moves all extremities spontaneously.  Psych:  Responds to questions appropriately with a normal affect.  Skin:  She has a reddish, scaley rash on her arms, legs, trunk and a slight rash on her back.  ECG: Dec 26, 2011-normal sinus rhythm. The P wave is very small.   Assessment / Plan:

## 2011-12-26 NOTE — Assessment & Plan Note (Signed)
She's having generalized fatigue. Some of her symptoms may be caused by the Lasix therapy. We will try stopping the Lasix and potassium. She has normal left ventricle systolic function.  I've stressed the importance of a low salt diet. I'll see her in 3 months for reassessment.

## 2012-01-10 ENCOUNTER — Telehealth: Payer: Self-pay | Admitting: Cardiovascular Disease

## 2012-01-10 NOTE — Telephone Encounter (Signed)
All we have is pradaxa 75 mg, left msg to take two tablets twice daily and to check back in a week or two for more samples.

## 2012-01-10 NOTE — Telephone Encounter (Signed)
Pt calling requesting samples of pradaxa, pls call [phone]

## 2012-01-25 ENCOUNTER — Telehealth: Payer: Self-pay | Admitting: Cardiovascular Disease

## 2012-01-25 NOTE — Telephone Encounter (Signed)
Samples available, pt informed at desk.

## 2012-01-25 NOTE — Telephone Encounter (Signed)
New msg Pt wants some more samples on pradaxa. Please let her know

## 2012-02-03 ENCOUNTER — Ambulatory Visit (INDEPENDENT_AMBULATORY_CARE_PROVIDER_SITE_OTHER): Payer: Medicare Other | Admitting: Nurse Practitioner

## 2012-02-03 ENCOUNTER — Telehealth: Payer: Self-pay | Admitting: Cardiovascular Disease

## 2012-02-03 ENCOUNTER — Encounter: Payer: Self-pay | Admitting: Nurse Practitioner

## 2012-02-03 ENCOUNTER — Encounter (INDEPENDENT_AMBULATORY_CARE_PROVIDER_SITE_OTHER): Payer: Medicare Other

## 2012-02-03 VITALS — BP 130/88 | HR 104 | Ht 64.0 in | Wt 134.2 lb

## 2012-02-03 DIAGNOSIS — R002 Palpitations: Secondary | ICD-10-CM

## 2012-02-03 DIAGNOSIS — R21 Rash and other nonspecific skin eruption: Secondary | ICD-10-CM

## 2012-02-03 DIAGNOSIS — I2510 Atherosclerotic heart disease of native coronary artery without angina pectoris: Secondary | ICD-10-CM

## 2012-02-03 DIAGNOSIS — I4891 Unspecified atrial fibrillation: Secondary | ICD-10-CM

## 2012-02-03 MED ORDER — METOPROLOL TARTRATE 50 MG PO TABS: 50.0000 mg | ORAL_TABLET | Freq: Three times a day (TID) | ORAL | Status: DC

## 2012-02-03 NOTE — Assessment & Plan Note (Addendum)
Patient presents today with complaints of persistent fatigue and weakness. No real palpitations. Does appear to be out of rhythm today. Hard to say what the duration has been. She is protected with her Pradaxa. Has had recent labs with her PCP which are reported to be ok. Son is concerned about the possibility of bradycardia. She could certainly have tachy brady.  Apparently her sister has a pacemaker. I have increased her Metoprolol to 50 mg TID. Will also place a 48 hour monitor which could turn into an event monitor with [employer]. Will need to see her back in one week. If it looks like she has persistent arrhythmia, could consider cardioversion but I suspect with her moderate LAE that antiarrhythmic therapy is going to be needed to restore sinus rhythm. Patient is agreeable to this plan and will call if any problems develop in the interim.

## 2012-02-03 NOTE — Assessment & Plan Note (Signed)
May need to consider stress testing if symptoms do not abate and her monitor results are satisfactory.

## 2012-02-03 NOTE — Telephone Encounter (Signed)
App made 

## 2012-02-03 NOTE — Progress Notes (Signed)
[name] Date of Birth: [birth year] Medical Record #[id number]  History of Present Illness: Donna Ortega is seen today for a work in visit. She is seen for Dr. Elease Hashimoto. She has PAF, CAD with remote CABG. Has had past GI bleeding. Intolerant to amiodarone due to rash. She stopped it back in March. She is on Pradaxa for her anticoagulation. Her LV systolic function has been normal but does have moderate LAE.   She comes in today. She is here with her son. She is complaining of palpations and fatigue. Her son Donna Ortega says she really just hasn't been feeling good since December. Has been to the PCP. Labs are ok. He is wondering if her heart rate is too slow. They have not been checking at home. No chest pain reported. She has not had follow up stress testing since her CABG x 3 in 2007. She denies having any actual palpitations but does have some occasional dizziness. No falls.   Current Outpatient Prescriptions on File Prior to Visit  Medication Sig Dispense Refill  . Calcium Carbonate-Vitamin D (CALTRATE 600+D PO) Take 1 tablet by mouth daily.       . dabigatran (PRADAXA) 150 MG CAPS Take 150 mg by mouth every 12 (twelve) hours.        Marland Kitchen diltiazem (CARDIZEM CD) 240 MG 24 hr capsule Take 1 capsule (240 mg total) by mouth daily.  40 capsule  0  . ferrous sulfate 325 (65 FE) MG tablet Take 325 mg by mouth daily with breakfast.      . lisinopril (PRINIVIL,ZESTRIL) 40 MG tablet Take 40 mg by mouth daily.        . metoprolol (LOPRESSOR) 50 MG tablet Take 1 tablet (50 mg total) by mouth 2 (two) times daily.  60 tablet  5  . Multiple Vitamin (MULTIVITAMIN) tablet Take 1 tablet by mouth daily.        . pravastatin (PRAVACHOL) 40 MG tablet Take 1 tablet (40 mg total) by mouth daily.  30 tablet  7    Allergies  Allergen Reactions  . Amiodarone Hives    ALL OVER BODY HIVES/ ITCH  . Lipitor (Atorvastatin Calcium)     MUSCLE ACHES  . Niaspan (Niacin Er) Rash    Past Medical History  Diagnosis Date  .  Coronary artery disease 2007    3 vessel CABG with LIMA-LAD, SVG to diag, and SVG - Ramus  . Hyperlipidemia   . Hypertension   . PAF (paroxysmal atrial fibrillation)     had rash with amiodarone; no anticoagulation due to GI bleed in December 2012  . History of hyperkalemia   . Hyponatremia   . Diverticulitis   . Palpitations   . History of GI bleed Dec 2012  . Chronic anticoagulation     on Pradaxa    Past Surgical History  Procedure Date  . Maze   . Coronary artery bypass graft   . Partial hysterectomy   . Esophagogastroduodenoscopy 08/04/2011    Procedure: ESOPHAGOGASTRODUODENOSCOPY (EGD);  Surgeon: Florencia Reasons, MD;  Location: [facility] ENDOSCOPY;  Service: Endoscopy;  Laterality: N/A;  do at bedside  . Abdominal hysterectomy     History  Smoking status  . Never Smoker   Smokeless tobacco  . Not on file    History  Alcohol Use No    Family History  Problem Relation Age of Onset  . Heart failure Father   . Kidney failure Father   . Bone cancer Brother   .  Kidney failure Sister   . Colon cancer Sister   . Cancer Sister     Review of Systems: The review of systems is per the HPI.  She has some chronic issues with fatigue and generalized weakness. All other systems were reviewed and are negative.  Physical Exam: BP 130/88  Pulse 104  Ht 5\' 4"  (1.626 m)  Wt 134 lb 3.2 oz (60.873 kg)  BMI 23.04 kg/m2 Patient is very pleasant and in no acute distress. Skin is warm and dry. Color is normal.  HEENT is unremarkable. Normocephalic/atraumatic. PERRL. Sclera are nonicteric. Neck is supple. No masses. No JVD. Lungs are clear. Cardiac exam shows an irregular rate and rhythm. She is tachycardic. Abdomen is soft. Extremities are without edema. Gait and ROM are intact. No gross neurologic deficits noted.   LABORATORY DATA: EKG today shows probable 2:1 atrial flutter. Rate is 110.   Lab Results  Component Value Date   WBC 8.5 08/21/2011   HGB 10.7* 08/21/2011   HCT 30.9*  08/21/2011   PLT 276 08/21/2011   GLUCOSE 84 12/26/2011   CHOL 177 08/31/2011   TRIG 46.0 08/31/2011   HDL 86.50 08/31/2011   LDLCALC 81 08/31/2011   ALT 24 08/31/2011   AST 24 08/31/2011   NA 135 12/26/2011   K 4.3 12/26/2011   CL 96 12/26/2011   CREATININE 1.0 12/26/2011   BUN 35* 12/26/2011   CO2 27 12/26/2011   TSH 1.349 08/22/2011   INR 1.34 08/20/2011   Echo December 2012 Study Conclusions  - Left ventricle: The cavity size was normal. There was mild focal basal and mild concentric hypertrophy of the septum. Systolic function was normal. The estimated ejection fraction was in the range of 55% to 60%. Regional wall motion abnormalities cannot be excluded. - Aortic valve: Mild regurgitation. - Mitral valve: Calcified annulus. Mild regurgitation. - Left atrium: The atrium was moderately dilated. - Right atrium: The atrium was moderately dilated. - Atrial septum: There was an atrial septal aneurysm. - Pulmonary arteries: Systolic pressure was moderately increased.    Assessment / Plan:

## 2012-02-03 NOTE — Telephone Encounter (Signed)
New msg Pt wants to talk to you about coming back to see Dr Elease Hashimoto

## 2012-02-03 NOTE — Assessment & Plan Note (Signed)
Resolved with discontinuation of her amiodarone.

## 2012-02-03 NOTE — Patient Instructions (Signed)
I am going to increase the Metoprolol to 50 mg three times a day  We are going to put a heart monitor on (48 hours) to see what your heart rate is doing.   Dr. Elease Hashimoto will see you back in a week.   Call the [facility] office at [phone] if you have any questions, problems or concerns.

## 2012-02-06 ENCOUNTER — Encounter: Payer: Self-pay | Admitting: Internal Medicine

## 2012-02-06 ENCOUNTER — Ambulatory Visit (INDEPENDENT_AMBULATORY_CARE_PROVIDER_SITE_OTHER)
Admission: RE | Admit: 2012-02-06 | Discharge: 2012-02-06 | Disposition: A | Discharge status: Home / Self Care | Payer: Medicare Other | Source: Ambulatory Visit | Attending: Internal Medicine | Admitting: Internal Medicine

## 2012-02-06 ENCOUNTER — Ambulatory Visit (INDEPENDENT_AMBULATORY_CARE_PROVIDER_SITE_OTHER): Payer: Medicare Other | Admitting: Internal Medicine

## 2012-02-06 ENCOUNTER — Encounter: Payer: Self-pay | Admitting: *Deleted

## 2012-02-06 ENCOUNTER — Telehealth: Payer: Self-pay | Admitting: *Deleted

## 2012-02-06 VITALS — BP 110/50 | HR 60 | Ht 64.0 in | Wt 134.0 lb

## 2012-02-06 DIAGNOSIS — I4891 Unspecified atrial fibrillation: Secondary | ICD-10-CM

## 2012-02-06 DIAGNOSIS — R5383 Other fatigue: Secondary | ICD-10-CM

## 2012-02-06 DIAGNOSIS — R55 Syncope and collapse: Secondary | ICD-10-CM | POA: Insufficient documentation

## 2012-02-06 DIAGNOSIS — I495 Sick sinus syndrome: Secondary | ICD-10-CM

## 2012-02-06 DIAGNOSIS — R5381 Other malaise: Secondary | ICD-10-CM

## 2012-02-06 LAB — BASIC METABOLIC PANEL
BUN: 29 mg/dL — ABNORMAL HIGH (ref 6–23)
CO2: 27 mEq/L (ref 19–32)
Calcium: 9.2 mg/dL (ref 8.4–10.5)
Creatinine, Ser: 1 mg/dL (ref 0.4–1.2)

## 2012-02-06 LAB — PROTIME-INR
INR: 1.3 ratio — ABNORMAL HIGH (ref 0.8–1.0)
Prothrombin Time: 14.7 s — ABNORMAL HIGH (ref 10.2–12.4)

## 2012-02-06 LAB — CBC WITH DIFFERENTIAL/PLATELET
Basophils Absolute: 0 10*3/uL (ref 0.0–0.1)
Basophils Relative: 0.3 % (ref 0.0–3.0)
Eosinophils Absolute: 0.1 10*3/uL (ref 0.0–0.7)
Lymphocytes Relative: 23.9 % (ref 12.0–46.0)
MCHC: 32.9 g/dL (ref 30.0–36.0)
Neutrophils Relative %: 64.5 % (ref 43.0–77.0)
Platelets: 195 10*3/uL (ref 150.0–400.0)
RBC: 3.96 Mil/uL (ref 3.87–5.11)
RDW: 13.8 % (ref 11.5–14.6)

## 2012-02-06 NOTE — Assessment & Plan Note (Signed)
The patient has a history of atrial fibrillation status post maze. She presented last week with an atrial tachycardia/slow flutter likely related to maze operation at a rate of 110. Event recording demonstrates only back but atrial fibrillation with a rapid rate. She also has evidence of sinus node dysfunction with abrupt pauses without warning of 5-8 seconds a history of presyncope. These manifestations of tachybradycardia syndrome, she will need to undergo pacing to allow for rate control.  She is not a candidate for antiarrhythmic drug therapy. She was intolerant of amiodarone. Her coronary disease precludes the use of a 1C and her QT interval preclude the use of a class III.  Is also possible that Cardizem is contributing to her fatigue and lassitude and alternate rate control will be considered following pacing.

## 2012-02-06 NOTE — Telephone Encounter (Signed)
Dr Elease Hashimoto spoke with family, holter monitor showed 4 sec pause. App today with dr Graciela Husbands @ 2:00. Dr Elease Hashimoto told Daughter in law to stop her Metoprolol.

## 2012-02-06 NOTE — Patient Instructions (Addendum)
Your physician has recommended that you have a pacemaker inserted. A pacemaker is a small device that is placed under the skin of your chest or abdomen to help control abnormal heart rhythms. This device uses electrical pulses to prompt the heart to beat at a normal rate. Pacemakers are used to treat heart rhythms that are too slow. Wire (leads) are attached to the pacemaker that goes into the chambers of you heart. This is done in the hospital and usually requires and overnight stay. Please see the instruction sheet given to you today for more information.   Your physician recommends that you HAVE LAB WORK TODAY  A chest x-ray takes a picture of the organs and structures inside the chest, including the heart, lungs, and blood vessels. This test can show several things, including, whether the heart is enlarges; whether fluid is building up in the lungs; and whether pacemaker / defibrillator leads are still in place. AT [address]

## 2012-02-06 NOTE — Progress Notes (Signed)
ELECTROPHYSIOLOGY CONSULT NOTE  Patient ID: Donna Ortega, MRN: [id number], DOB/AGE: 1926-07-08 78 y.o. Admit date: (Not on file) Date of Consult: 02/06/2012  Primary Physician: Donna Dials, MD Primary Cardiologist: Donna Ortega  Chief Complaint:  NOT FEEL GOOD   HPI Donna Ortega is a 78 y.o. female : seen at the request of Dr Donna Ortega for tachy brady syndrome Has history of atrial fibrillation dating back 5 years for which amiod was tried along the way but was not tolerated  A few months ago she was very ill had  afib with RVR and cardizaem was initiated..she continues to have lassitude and  Fatigue and was given an event recorder which shows pauses >5 sec with post termination as well as HR>130  She is s/p CABG, has normal LV function  CHADSVASC= 1-gender.2-age, CAD-1,HTN-1 +>>>5  Shehas hx of GI bleed and is on fesupplementation with some GI s  Also on pradaxa  Past Medical History  Diagnosis Date  . Coronary artery disease 2007    3 vessel CABG with LIMA-LAD, SVG to diag, and SVG - Ramus  . Hyperlipidemia   . Hypertension   . PAF (paroxysmal atrial fibrillation)     had rash with amiodarone; no anticoagulation due to GI bleed in December 2012  . History of hyperkalemia   . Hyponatremia   . Diverticulitis   . Palpitations   . History of GI bleed Dec 2012  . Chronic anticoagulation     on Pradaxa      Surgical History:  Past Surgical History  Procedure Date  . Maze   . Coronary artery bypass graft Feb 2007    Dr. Laneta Ortega;   Marland Kitchen Partial hysterectomy   . Esophagogastroduodenoscopy 08/04/2011    Procedure: ESOPHAGOGASTRODUODENOSCOPY (EGD);  Surgeon: Donna Reasons, MD;  Location[facility] END[facility]COPY;  Service: Endoscopy;  Laterality: N/A;  do at bedside  . Abdominal hysterectomy      Home Meds: Prior to Admission medications   Medication Sig Start Date End Date Taking? Authorizing Provider  Calcium Carbonate-Vitamin D (CALTRATE 600+D PO) Take 1 tablet by mouth daily.    Yes Historical  Provider, MD  dabigatran (PRADAXA) 150 MG CAPS Take 150 mg by mouth every 12 (twelve) hours.     Yes Historical Provider, MD  diltiazem (CARDIZEM CD) 240 MG 24 hr capsule Take 1 capsule (240 mg total) by mouth daily. 08/14/11 08/13/12 Yes Donna Cantor, MD  ferrous sulfate 325 (65 FE) MG tablet Take 325 mg by mouth daily with breakfast.   Yes Historical Provider, MD  lisinopril (PRINIVIL,ZESTRIL) 40 MG tablet Take 40 mg by mouth daily.     Yes Historical Provider, MD  Multiple Vitamin (MULTIVITAMIN) tablet Take 1 tablet by mouth daily.     Yes Historical Provider, MD  pravastatin (PRAVACHOL) 40 MG tablet Take 1 tablet (40 mg total) by mouth daily. 11/18/11  Yes Donna Mixer, MD  vitamin B-12 (CYANOCOBALAMIN) 1000 MCG tablet Take 1,000 mcg by mouth daily.   Yes Historical Provider, MD     Allergies:  Allergies  Allergen Reactions  . Amiodarone Hives    ALL OVER BODY HIVES/ ITCH  . Aspirin     Gi bleed  . Lipitor (Atorvastatin Calcium)     MUSCLE ACHES  . Niaspan (Niacin Er) Rash    History   Social History  . Marital Status: Single    Spouse Name: N/A    Number of Children: N/A  . Years of Education: N/A  Occupational History  . Not on file.   Social History Main Topics  . Smoking status: Never Smoker   . Smokeless tobacco: Not on file  . Alcohol Use: No  . Drug Use: No  . Sexually Active: No   Other Topics Concern  . Not on file   Social History Narrative   Ambulatory usually independently, plays golfLives at home with familyDaughter in law Donna Ortega [phone]     Family History  Problem Relation Age of Onset  . Heart failure Father   . Kidney failure Father   . Bone cancer Brother   . Kidney failure Sister   . Colon cancer Sister   . Cancer Sister      ROS:  Please see the history of present illness.   Bladder issues  All other systems reviewed and negative.    Physical Exam:  Blood pressure 110/50, pulse 60, height 5\' 4"  (1.626 m), weight 134 lb  (60.782 kg). General: Well developed, well nourished female in no acute distress. Head: Normocephalic, atraumatic, sclera non-icteric, no xanthomas, nares are without discharge. Lymph Nodes:  none Neck: Negative for carotid bruits. JVD not elevated. Lungs: Clear bilaterally to auscultation without wheezes, rales, or rhonchi. Breathing is unlabored. Heart: RRR with S1 S2. No murmurs, rubs, +S4 Abdomen: Soft, non-tender, non-distended with normoactive bowel sounds. No hepatomegaly. No rebound/guarding. No obvious abdominal masses. Msk:  Strength and tone appear normal for age. Extremities: No clubbing or cyanosis. No edema.  Distal pedal pulses are 2+ and equal bilaterally. Skin: Warm and Dry Neuro: Alert and oriented X 3. CN III-XII intact Grossly normal sensory and motor function . Psych:  Responds to questions appropriately with a normal affect.      Labs: Cardiac Enzymes No results found for this basename: CKTOTAL:4,CKMB:4,TROPONINI:4 in the last 72 hours CBC Lab Results  Component Value Date   WBC 8.5 08/21/2011   HGB 10.7* 08/21/2011   HCT 30.9* 08/21/2011   MCV 86.6 08/21/2011   PLT 276 08/21/2011   PROTIME: No results found for this basename: LABPROT:3,INR:3 in the last 72 hours Chemistry No results found for this basename: NA,K,CL,CO2,BUN,CREATININE,CALCIUM,LABALBU,PROT,BILITOT,ALKPHOS,ALT,AST,GLUCOSE in the last 168 hours Lipids Lab Results  Component Value Date   CHOL 177 08/31/2011   HDL 86.50 08/31/2011   LDLCALC 81 08/31/2011   TRIG 46.0 08/31/2011   BNP Pro B Natriuretic peptide (BNP)  Date/Time Value Range Status  08/21/2011  5:17 AM 2193.0* 0 - 450 pg/mL Final   Miscellaneous No results found for this basename: DDIMER    Radiology/Studies:  No results found.  EKG:  ATach 2:1;    eVENT rECORDER>>post termination pauses> 5 sec    Assessment and Plan:   Donna Ortega

## 2012-02-06 NOTE — Assessment & Plan Note (Signed)
She has evidence of both atrial arrhythmias as well as sinus node dysfunction probable chronotropic incompetence. These may be contributing to her fatigue as may be her calcium channel blocker. She is having a significant amount of stress since the introduction of iron and we will stop it. Will have to be cognizant of the possibility that her Pradaxa that is contributing to her GI distress.

## 2012-02-06 NOTE — Assessment & Plan Note (Signed)
As above The benefits and risks were reviewed including but not limited to death,  perforation, infection, lead dislodgement and device malfunction.  The patient understands agrees and is willing to proceed.

## 2012-02-07 MED ORDER — SODIUM CHLORIDE 0.9 % IR SOLN
80.0000 mg | Status: DC
  Filled 2012-02-07: qty 2

## 2012-02-07 MED ORDER — CEFAZOLIN SODIUM-DEXTROSE 2-3 GM-% IV SOLR
2.0000 g | INTRAVENOUS | Status: DC
  Filled 2012-02-07: qty 50

## 2012-02-08 ENCOUNTER — Ambulatory Visit (HOSPITAL_COMMUNITY)
Admission: RE | Admit: 2012-02-08 | Discharge: 2012-02-09 | Disposition: A | Discharge status: Home / Self Care | Payer: Medicare Other | Source: Ambulatory Visit | Attending: Internal Medicine | Admitting: Internal Medicine

## 2012-02-08 ENCOUNTER — Encounter (HOSPITAL_COMMUNITY)
Admission: RE | Disposition: A | Discharge status: Home / Self Care | Payer: Self-pay | Source: Ambulatory Visit | Attending: Internal Medicine

## 2012-02-08 ENCOUNTER — Encounter (HOSPITAL_COMMUNITY): Payer: Self-pay | Admitting: *Deleted

## 2012-02-08 DIAGNOSIS — R55 Syncope and collapse: Secondary | ICD-10-CM

## 2012-02-08 DIAGNOSIS — I495 Sick sinus syndrome: Secondary | ICD-10-CM

## 2012-02-08 DIAGNOSIS — I4891 Unspecified atrial fibrillation: Secondary | ICD-10-CM

## 2012-02-08 DIAGNOSIS — R5383 Other fatigue: Secondary | ICD-10-CM

## 2012-02-08 DIAGNOSIS — I2510 Atherosclerotic heart disease of native coronary artery without angina pectoris: Secondary | ICD-10-CM

## 2012-02-08 DIAGNOSIS — I10 Essential (primary) hypertension: Secondary | ICD-10-CM

## 2012-02-08 DIAGNOSIS — Z950 Presence of cardiac pacemaker: Secondary | ICD-10-CM

## 2012-02-08 DIAGNOSIS — Z7901 Long term (current) use of anticoagulants: Secondary | ICD-10-CM | POA: Insufficient documentation

## 2012-02-08 HISTORY — PX: PERMANENT PACEMAKER INSERTION: SHX5480

## 2012-02-08 LAB — SURGICAL PCR SCREEN
MRSA, PCR: NEGATIVE
Staphylococcus aureus: POSITIVE — AB

## 2012-02-08 LAB — CBC
HCT: 35.3 % — ABNORMAL LOW (ref 36.0–46.0)
MCHC: 33.4 g/dL (ref 30.0–36.0)
Platelets: 168 10*3/uL (ref 150–400)
RDW: 12.9 % (ref 11.5–15.5)

## 2012-02-08 SURGERY — PERMANENT PACEMAKER INSERTION
Anesthesia: LOCAL

## 2012-02-08 MED ORDER — SODIUM CHLORIDE 0.9 % IV SOLN
INTRAVENOUS | Status: AC
  Administered 2012-02-08: 50 mL/h via INTRAVENOUS

## 2012-02-08 MED ORDER — CEFAZOLIN SODIUM 1-5 GM-% IV SOLN
1.0000 g | Freq: Four times a day (QID) | INTRAVENOUS | Status: AC
  Administered 2012-02-08 – 2012-02-09 (×3): 1 g via INTRAVENOUS
  Filled 2012-02-08 (×3): qty 50

## 2012-02-08 MED ORDER — VITAMIN B-12 1000 MCG PO TABS
1000.0000 ug | ORAL_TABLET | Freq: Every day | ORAL | Status: DC
  Administered 2012-02-08 – 2012-02-09 (×2): 1000 ug via ORAL
  Filled 2012-02-08 (×2): qty 1

## 2012-02-08 MED ORDER — ONDANSETRON HCL 4 MG/2ML IJ SOLN
4.0000 mg | Freq: Four times a day (QID) | INTRAMUSCULAR | Status: DC | PRN
Start: 2012-02-08 — End: 2012-02-09

## 2012-02-08 MED ORDER — LIDOCAINE HCL (PF) 1 % IJ SOLN
INTRAMUSCULAR | Status: AC
  Filled 2012-02-08: qty 60

## 2012-02-08 MED ORDER — ZOLPIDEM TARTRATE 5 MG PO TABS
5.0000 mg | ORAL_TABLET | Freq: Every evening | ORAL | Status: DC | PRN
  Administered 2012-02-08: 5 mg via ORAL
  Filled 2012-02-08: qty 1

## 2012-02-08 MED ORDER — MIDAZOLAM HCL 5 MG/5ML IJ SOLN
INTRAMUSCULAR | Status: AC
  Filled 2012-02-08: qty 5

## 2012-02-08 MED ORDER — METOPROLOL TARTRATE 50 MG PO TABS
75.0000 mg | ORAL_TABLET | Freq: Two times a day (BID) | ORAL | Status: DC
  Administered 2012-02-08 – 2012-02-09 (×2): 75 mg via ORAL
  Filled 2012-02-08 (×3): qty 1

## 2012-02-08 MED ORDER — CEFAZOLIN SODIUM-DEXTROSE 2-3 GM-% IV SOLR
INTRAVENOUS | Status: AC
  Filled 2012-02-08: qty 50

## 2012-02-08 MED ORDER — HEPARIN (PORCINE) IN NACL 2-0.9 UNIT/ML-% IJ SOLN
INTRAMUSCULAR | Status: AC
  Filled 2012-02-08: qty 1000

## 2012-02-08 MED ORDER — SODIUM CHLORIDE 0.45 % IV SOLN: INTRAVENOUS | Status: DC

## 2012-02-08 MED ORDER — SODIUM CHLORIDE 0.9 % IV SOLN
250.0000 mL | INTRAVENOUS | Status: DC
  Administered 2012-02-08: 50 mL via INTRAVENOUS

## 2012-02-08 MED ORDER — FENTANYL CITRATE 0.05 MG/ML IJ SOLN
INTRAMUSCULAR | Status: AC
  Filled 2012-02-08: qty 2

## 2012-02-08 MED ORDER — CHLORHEXIDINE GLUCONATE 4 % EX LIQD
60.0000 mL | Freq: Once | CUTANEOUS | Status: AC
  Administered 2012-02-08: 4 via TOPICAL

## 2012-02-08 MED ORDER — ACETAMINOPHEN 325 MG PO TABS
325.0000 mg | ORAL_TABLET | ORAL | Status: DC | PRN
  Administered 2012-02-08: 650 mg via ORAL
  Filled 2012-02-08: qty 2

## 2012-02-08 MED ORDER — SODIUM CHLORIDE 0.9 % IJ SOLN: 3.0000 mL | Freq: Two times a day (BID) | INTRAMUSCULAR | Status: DC

## 2012-02-08 MED ORDER — FERROUS SULFATE 325 (65 FE) MG PO TABS
325.0000 mg | ORAL_TABLET | Freq: Every day | ORAL | Status: DC
  Administered 2012-02-09: 325 mg via ORAL
  Filled 2012-02-08 (×2): qty 1

## 2012-02-08 MED ORDER — SODIUM CHLORIDE 0.9 % IJ SOLN: 3.0000 mL | INTRAMUSCULAR | Status: DC | PRN

## 2012-02-08 MED ORDER — DILTIAZEM HCL ER COATED BEADS 240 MG PO CP24
240.0000 mg | ORAL_CAPSULE | Freq: Every day | ORAL | Status: DC
Start: 2012-02-08 — End: 2012-02-09
  Administered 2012-02-08 – 2012-02-09 (×2): 240 mg via ORAL
  Filled 2012-02-08 (×2): qty 1

## 2012-02-08 MED ORDER — LISINOPRIL 40 MG PO TABS
40.0000 mg | ORAL_TABLET | Freq: Every day | ORAL | Status: DC
  Administered 2012-02-08 – 2012-02-09 (×2): 40 mg via ORAL
  Filled 2012-02-08 (×2): qty 1

## 2012-02-08 MED ORDER — DABIGATRAN ETEXILATE MESYLATE 150 MG PO CAPS
150.0000 mg | ORAL_CAPSULE | Freq: Two times a day (BID) | ORAL | Status: DC
Start: 2012-02-08 — End: 2012-02-09
  Administered 2012-02-08 – 2012-02-09 (×2): 150 mg via ORAL
  Filled 2012-02-08 (×3): qty 1

## 2012-02-08 MED ORDER — MUPIROCIN 2 % EX OINT
TOPICAL_OINTMENT | CUTANEOUS | Status: AC
  Administered 2012-02-08: 1 via NASAL
  Filled 2012-02-08: qty 22

## 2012-02-08 NOTE — CV Procedure (Signed)
Preop DX::tachybrady Post op DX:: same  Procedure  dual pacemaker implantation  After routine prep and drape, lidocaine was infiltrated in the prepectoral subclavicular region on the left side an incision was made and carried down to later the prepectoral fascia using electrocautery and sharp dissection a pocket was formed similarly. Hemostasis was obtained.  After this, we turned our attention to gaining accessm to the extrathoracic,left subclavian vein. This was accomplished without difficulty and without the aspiration of air or puncture of the artery. 2 separate venipunctures were accomplished; guidewires were placed and retained and sequentially 7 French sheath through which were  passed a Medtronic MRI compatible  ventricular lead serial [id number] and an Medtronic MRI compatible  atrial lead serial number [id number] .  The ventricular lead was manipulated to the right ventricular apex with a bipolar R wave was 8.8, the pacing impedance was1544, the threshold was 0.6 @ 0.5 msec  Current at threshold was   0.3  and the current of injury was birks.  The right atrial lead was manipulated to the right atrial appendage mouth with a bipolar P-wave  1.0, the pacing impedance was 919, the threshold0.7@ 0..5 msec   Current at threshold was 1.2 and the current of injury was moderate.  The initially deployed R ATrial lead was removed after it did not seed stably. I had already removed the sheath and so we had to reobtain access which was quite a challenge and require a micropuncture needle  The ventricular lead was marked with a tie prior to the insertion of the atrial lead. The leads were affixed to the prepectoral fascia and attached to a Medtronic MRI compatible pulse generator serial number [id number].  Hemostasis was obtained. The pocket was copiously irrigated with antibiotic containing saline solution. The leads and the pulse generator were placed in the pocket and affixed to the  prepectoral fascia. The wound is then closed in 3 layers in normal fashion. The wound is washed dried and a benzoin Steri-Strip this was applied the account sponge counts and instrument counts were correct at the end of the procedure .Marland Kitchen The patient tolerated the procedure without apparent complication.  Gerlene Burdock.D.

## 2012-02-08 NOTE — Progress Notes (Signed)
During shift change, small hematoma was noted at incision site.  Stain-marked and will continue to monitor.  Also, HR has been in 130's Sinus Tach.  Notified Hurman Horn, PA, and received order for a CBC tonight and was told to give tonight's Metoprolol dose now.  Will continue to monitor.  Donna Ortega

## 2012-02-09 ENCOUNTER — Ambulatory Visit (HOSPITAL_COMMUNITY): Payer: Medicare Other

## 2012-02-09 ENCOUNTER — Encounter: Payer: Self-pay | Admitting: *Deleted

## 2012-02-09 DIAGNOSIS — Z950 Presence of cardiac pacemaker: Secondary | ICD-10-CM | POA: Insufficient documentation

## 2012-02-09 HISTORY — DX: Presence of cardiac pacemaker: Z95.0

## 2012-02-09 LAB — CBC
MCH: 30.9 pg (ref 26.0–34.0)
Platelets: 168 10*3/uL (ref 150–400)
RBC: 3.95 MIL/uL (ref 3.87–5.11)

## 2012-02-09 NOTE — Progress Notes (Signed)
Pt's HR overnight has come down to low 100's.  Will continue to monitor.  Arva Chafe

## 2012-02-09 NOTE — Progress Notes (Signed)
   ELECTROPHYSIOLOGY ROUNDING NOTE    Patient Name: Donna Ortega Date of Encounter: 02-09-2012    SUBJECTIVE:Patient feels well.  No chest pain or shortness of breath.  Minimal incisional soreness.  S/p PPM 02-08-2012  TELEMETRY: Reviewed telemetry pt in sinus rhythm with onset of atrial tachycardia yesterday evening, rate 120's Filed Vitals:   02/08/12 1700 02/08/12 1715 02/08/12 1920 02/09/12 0516  BP: 167/71 157/69 139/91 116/77  Pulse: 61 60 78 116  Temp:   97.7 F (36.5 C) 97.8 F (36.6 C)  TempSrc:   Oral Oral  Resp:   18 16  Height:      Weight:      SpO2:   95% 96%   LABS: Basic Metabolic Panel:  Basename 02/06/12 1449  NA 136  K 4.4  CL 101  CO2 27  GLUCOSE 130*  BUN 29*  CREATININE 1.0  CALCIUM 9.2  MG --  PHOS --  CBC:  Basename 02/09/12 0500 02/08/12 2055 02/06/12 1449  WBC 6.5 7.8 --  NEUTROABS -- -- 3.7  HGB 12.2 11.8* --  HCT 35.8* 35.3* --  MCV 90.6 91.2 --  PLT 168 168 --    Radiology/Studies:  Final result pending, leads in stable position.  PHYSICAL EXAM Left chest without hematoma or ecchymosis.   DEVICE INTERROGATION: Device interrogated by industry.  Lead values including impedence, sensing, threshold within normal values.    Patient with previously known atrial fibrillation and atrial tachycardia.  She has known CAD with CABG in 1997.  She also has a prolonged QT interval.    Wound care, arm mobility, restrictions reviewed with patient.    Discharge today  Will Increase metoprolol  Sherryl Manges, MD 02/09/2012 11:35 AM

## 2012-02-09 NOTE — Progress Notes (Signed)
Pt d/c home with instructions and f/u appointment, pt verbalized understanding. Arm restrictions/precautions discussed with pt and son, they both verbalized understanding. Pt home with son.  Ninetta Lights RN

## 2012-02-09 NOTE — Progress Notes (Signed)
Orthopedic Tech Progress Note Patient Details:  Donna Ortega 01-Mar-1926 [id number]  Patient ID: [name], female   DOB: 07-19-1926, 78 y.o.   MRN: [id number] Confirmed pt has arm sling.  Tamirah George T 02/09/2012, 1:09 PM

## 2012-02-09 NOTE — Discharge Summary (Signed)
ELECTROPHYSIOLOGY PROCEDURE DISCHARGE SUMMARY    Patient ID: Donna Ortega,  MRN: [id number], DOB/AGE: 1926-06-26 78 y.o.  Admit date: 02/08/2012 Discharge date: 02/09/2012  Primary Care Physician: Tracey Harries, MD Primary Cardiologist: Delane Ginger, MD Electrophysiologist: Sherryl Manges, MD  Primary Discharge Diagnosis:  Sinus node dysfunction status post pacemaker implantation this admission  Secondary Discharge Diagnosis:  1.  Atrial fibrillation/atrial flutter- s/p MAZE, post termination pauses associated with pre-syncope.  2.  CAD- status post CABG 2007 by Dr Laneta Simmers 3.  Chronic anticoagulation with Pradaxa  Procedures This Admission:  1.  Implantation of a MRI compatible dual chamber pacemaker on 02-08-2012 by Dr Graciela Husbands.  The patient received a Medtronic REVO pacemaker with model number 5086 right atrial and right ventricular leads.  There were no early immediate complications. 2.  Chest x-ray on 02-09-2012 demonstrated no pneumothorax status post device implantation.   Brief HPI: Donna Ortega was referred to Dr Graciela Husbands in the outpatient setting for consideration of pacemaker implantation.  The patient has a history of atrial fibrillation status post maze. She presented last week with an atrial tachycardia/slow flutter likely related to maze operation at a rate of 110. Event recording demonstrates not only that but atrial fibrillation with a rapid rate. She also has evidence of sinus node dysfunction with abrupt pauses without warning of 5-8 seconds a history of presyncope. These manifestations of tachybradycardia syndrome, she will need to undergo pacing to allow for rate control.  She is not a candidate for antiarrhythmic drug therapy. She was intolerant of amiodarone. Her coronary disease precludes the use of a 1C and her QT interval preclude the use of a class III. Is also possible that Cardizem is contributing to her fatigue and lassitude and alternate rate control will be considered  following pacing.  Dr Graciela Husbands reviewed risks, benefits, and alternatives to pacemaker implantation with the patient who wished to proceed.   Hospital Course:  The patient was admitted and underwent implantation of a Medtronic MRI compatible dual chamber pacemaker with details as outlined above.   She was monitored on telemetry overnight which demonstrated sinus rhythm, occasional atrial pacing, and atrial tachycardia at a rate of 120.  Left chest was without hematoma or ecchymosis.  The device was interrogated and found to be functioning normally.  CXR was obtained and demonstrated no pneumothorax status post device implantation.  Wound care, arm mobility, and restrictions were reviewed with the patient.  Dr Graciela Husbands examined the patient and considered them stable for discharge to home.    Discharge Vitals: Blood pressure 116/77, pulse 116, temperature 97.8 F (36.6 C), temperature source Oral, resp. rate 16, height 5\' 4"  (1.626 m), weight 134 lb (60.782 kg), SpO2 96.00%.    Labs:   Lab Results  Component Value Date   WBC 6.5 02/09/2012   HGB 12.2 02/09/2012   HCT 35.8* 02/09/2012   MCV 90.6 02/09/2012   PLT 168 02/09/2012     Lab 02/06/12 1449  NA 136  K 4.4  CL 101  CO2 27  BUN 29*  CREATININE 1.0  CALCIUM 9.2  PROT --  BILITOT --  ALKPHOS --  ALT --  AST --  GLUCOSE 130*   Discharge Medications:  Medication List  As of 02/09/2012  9:34 AM   TAKE these medications         CALTRATE 600+D PO   Take 1 tablet by mouth daily.      diltiazem 240 MG 24 hr capsule   Commonly known  as: CARDIZEM CD   Take 1 capsule (240 mg total) by mouth daily.      ferrous sulfate 325 (65 FE) MG tablet   Take 325 mg by mouth daily with breakfast.      lisinopril 40 MG tablet   Commonly known as: PRINIVIL,ZESTRIL   Take 40 mg by mouth daily.      metoprolol 50 MG tablet   Commonly known as: LOPRESSOR   Take 50 mg by mouth 2 (two) times daily.      multivitamin tablet   Take 1 tablet by mouth  daily.      PRADAXA 150 MG Caps   Generic drug: dabigatran   Take 150 mg by mouth every 12 (twelve) hours.      pravastatin 40 MG tablet   Commonly known as: PRAVACHOL   Take 1 tablet (40 mg total) by mouth daily.      vitamin B-12 1000 MCG tablet   Commonly known as: CYANOCOBALAMIN   Take 1,000 mcg by mouth daily.            Disposition:  Discharge Orders    Future Appointments: Provider: Department: Dept Phone: Center:   02/22/2012 4:00 PM [facility] [phone] [facility]   03/29/2012 9:30 AM [name], MD [facility] [phone] None     Future Orders Please Complete By Expires   Diet - low sodium heart healthy      Increase activity slowly      Discharge instructions      Comments:   Please see post pacemaker discharge instructions.     Follow-up Information    Follow up with [facility] on 02/22/2012. (At 4:00 PM for wound check)    Contact information:   [address]  [address] [place] [address] [phone]          Duration of Discharge Encounter: Greater than 30 minutes including physician time.  Signed, Gypsy Balsam, RN, BSN 02/09/2012, 9:34 AM Sherryl Manges, MD 02/09/2012 12:40 PM

## 2012-02-09 NOTE — Discharge Instructions (Signed)
   Supplemental Discharge Instructions for  Pacemaker/Defibrillator Patients  Activity No heavy lifting or vigorous activity with your left/right arm for 6 to 8 weeks.  Do not raise your left/right arm above your head for one week.  Gradually raise your affected arm as drawn below.           06/29                       06/30                       07/01                      07/02       NO DRIVING for 1 week; you may begin driving on 02/16/2012. WOUND CARE   Keep the wound area clean and dry.  Do not get this area wet for one week. No showers for one week; you may shower on 02/16/2012.   The tape/steri-strips on your wound will fall off; do not pull them off.  No bandage is needed on the site.  DO  NOT apply any creams, oils, or ointments to the wound area.   If you notice any drainage or discharge from the wound, any swelling or bruising at the site, or you develop a fever > 101? F after you are discharged home, call the office at once.  Special Instructions   You are still able to use cellular telephones; use the ear opposite the side where you have your pacemaker/defibrillator.  Avoid carrying your cellular phone near your device.   When traveling through airports, show security personnel your identification card to avoid being screened in the metal detectors.  Ask the security personnel to use the hand wand.   Avoid arc welding equipment, MRI testing (magnetic resonance imaging), TENS units (transcutaneous nerve stimulators).  Call the office for questions about other devices.   Avoid electrical appliances that are in poor condition or are not properly grounded.   Microwave ovens are safe to be near or to operate.  

## 2012-02-10 ENCOUNTER — Ambulatory Visit: Payer: Medicare Other | Admitting: Nurse Practitioner

## 2012-02-21 ENCOUNTER — Telehealth: Payer: Self-pay | Admitting: Cardiovascular Disease

## 2012-02-21 NOTE — Telephone Encounter (Signed)
New msg Pt will be in office to see Dr Graciela Husbands tomorrow. She would like pradaxa samples

## 2012-02-21 NOTE — Telephone Encounter (Signed)
Samples given to dr klein/ nurse

## 2012-02-22 ENCOUNTER — Ambulatory Visit (INDEPENDENT_AMBULATORY_CARE_PROVIDER_SITE_OTHER): Payer: Medicare Other | Admitting: *Deleted

## 2012-02-22 ENCOUNTER — Encounter: Payer: Self-pay | Admitting: Internal Medicine

## 2012-02-22 DIAGNOSIS — I4891 Unspecified atrial fibrillation: Secondary | ICD-10-CM

## 2012-02-22 DIAGNOSIS — I495 Sick sinus syndrome: Secondary | ICD-10-CM

## 2012-02-22 NOTE — Progress Notes (Signed)
Wound check pacer in clinic  

## 2012-02-24 LAB — PACEMAKER DEVICE OBSERVATION
AL IMPEDENCE PM: 512 Ohm
AL THRESHOLD: 0.5 V
RV LEAD AMPLITUDE: 12.5 mv
RV LEAD IMPEDENCE PM: 688 Ohm

## 2012-03-22 ENCOUNTER — Telehealth: Payer: Self-pay | Admitting: Cardiovascular Disease

## 2012-03-22 NOTE — Telephone Encounter (Signed)
Please return call to patient [phone] regarding Pradaxa samples

## 2012-03-22 NOTE — Telephone Encounter (Signed)
Samples provided, pt informed. 

## 2012-03-29 ENCOUNTER — Ambulatory Visit: Payer: Medicare Other | Admitting: Cardiovascular Disease

## 2012-04-17 ENCOUNTER — Ambulatory Visit (INDEPENDENT_AMBULATORY_CARE_PROVIDER_SITE_OTHER): Payer: Medicare Other | Admitting: Cardiovascular Disease

## 2012-04-17 ENCOUNTER — Encounter: Payer: Self-pay | Admitting: Cardiovascular Disease

## 2012-04-17 ENCOUNTER — Telehealth: Payer: Self-pay | Admitting: Cardiovascular Disease

## 2012-04-17 VITALS — BP 110/66 | HR 126 | Ht 64.0 in | Wt 137.4 lb

## 2012-04-17 DIAGNOSIS — I2510 Atherosclerotic heart disease of native coronary artery without angina pectoris: Secondary | ICD-10-CM

## 2012-04-17 DIAGNOSIS — I4891 Unspecified atrial fibrillation: Secondary | ICD-10-CM

## 2012-04-17 MED ORDER — METOPROLOL TARTRATE 50 MG PO TABS: 75.0000 mg | ORAL_TABLET | Freq: Two times a day (BID) | ORAL | Status: DC

## 2012-04-17 MED ORDER — DIGOXIN 125 MCG PO TABS: 0.1250 mg | ORAL_TABLET | Freq: Every day | ORAL | Status: DC

## 2012-04-17 NOTE — Telephone Encounter (Signed)
PT calling stating she went to pharmacy to pic up medications ordered (digoxin and metoprolol) and they did not have her digoxin--i called pharmacy and they assured me that both meds were ready to be picked up--i called pt back and advised she could pic up meds now--pt agrees

## 2012-04-17 NOTE — Progress Notes (Signed)
[name] Date of Birth  1926-07-13 [facility]     [place] Office  [address]    [address]   [address] [place], [place]  [address]    [place], [place]  [address] [phone]  Fax  [phone]  [phone]  Fax [phone]  Problems:  1. CAD 2. Atrial Fibrillation 3. Drug Rash 4. GI Bleed 5. Anemia   History of Present Illness:  Donna Ortega is an 78 year old female with a history of coronary artery disease and atrial fibrillation. She was admitted to the hospital in December with a GI bleed, anemia, and rapid atrial fibrillation. She was discharged with the additional medications of amiodarone and Cardizem CD.  She was seen in mid January and was doing fairly well.  She stopped her Amiodarone due to drug rash.  She still has generalized fatigue and generalized weakness. She wonders if it may be due to the Lasix and potassium.  April 17, 2012 She has been more fatigued recently.  She played golf this past weekend and still is not over the exertion.  Current Outpatient Prescriptions on File Prior to Visit  Medication Sig Dispense Refill  . Calcium Carbonate-Vitamin D (CALTRATE 600+D PO) Take 1 tablet by mouth daily.       . dabigatran (PRADAXA) 150 MG CAPS Take 150 mg by mouth every 12 (twelve) hours.        Marland Kitchen diltiazem (CARDIZEM CD) 240 MG 24 hr capsule Take 1 capsule (240 mg total) by mouth daily.  40 capsule  0  . lisinopril (PRINIVIL,ZESTRIL) 40 MG tablet Take 40 mg by mouth daily.        . metoprolol (LOPRESSOR) 50 MG tablet Take 50 mg by mouth 2 (two) times daily.      . Multiple Vitamin (MULTIVITAMIN) tablet Take 1 tablet by mouth daily.        . pravastatin (PRAVACHOL) 40 MG tablet Take 1 tablet (40 mg total) by mouth daily.  30 tablet  7    Allergies  Allergen Reactions  . Amiodarone Hives    ALL OVER BODY HIVES/ ITCH  . Aspirin     Gi bleed  . Lipitor (Atorvastatin Calcium)     MUSCLE ACHES  . Niaspan (Niacin Er) Rash    Past  Medical History  Diagnosis Date  . Coronary artery disease 2007    3 vessel CABG with LIMA-LAD, SVG to diag, and SVG - Ramus  . Hyperlipidemia   . Hypertension   . PAF (paroxysmal atrial fibrillation)     had rash with amiodarone; no anticoagulation due to GI bleed in December 2012  . History of hyperkalemia   . Hyponatremia   . Diverticulitis   . Palpitations   . History of GI bleed Dec 2012  . Chronic anticoagulation     on Pradaxa    Past Surgical History  Procedure Date  . Maze   . Coronary artery bypass graft Feb 2007    Dr. Laneta Simmers;   Marland Kitchen Partial hysterectomy   . Esophagogastroduodenoscopy 08/04/2011    Procedure: ESOPHAGOGASTRODUODENOSCOPY (EGD);  Surgeon: Florencia Reasons, MD;  Location: [facility] ENDOSCOPY;  Service: Endoscopy;  Laterality: N/A;  do at bedside  . Abdominal hysterectomy     History  Smoking status  . Never Smoker   Smokeless tobacco  . Not on file    History  Alcohol Use No    Family History  Problem Relation Age of Onset  . Heart failure Father   .  Kidney failure Father   . Bone cancer Brother   . Kidney failure Sister   . Colon cancer Sister   . Cancer Sister     Reviw of Systems:  Reviewed in the HPI.  All other systems are negative.  Physical Exam: Blood pressure 110/66, pulse 126, height 5\' 4"  (1.626 m), weight 137 lb 6.4 oz (62.324 kg). General: Well developed, well nourished, in no acute distress.  Head: Normocephalic, atraumatic, sclera non-icteric, mucus membranes are moist,   Neck: Supple. Negative for carotid bruits. JVD not elevated.  Lungs: Clear bilaterally to auscultation without wheezes, rales, or rhonchi. Breathing is unlabored.  Heart: RRR with S1 S2. No murmurs, rubs, or gallops appreciated.  Abdomen: Soft, non-tender, non-distended with normoactive bowel sounds. No hepatomegaly. No rebound/guarding. No obvious abdominal masses.  Msk:  Strength and tone appear normal for age.  Extremities: No clubbing or cyanosis.  No edema.  Distal pedal pulses are 2+ and equal bilaterally.  Neuro: Alert and oriented X 3. Moves all extremities spontaneously.  Psych:  Responds to questions appropriately with a normal affect.  Skin:  She has a reddish, scaley rash on her arms, legs, trunk and a slight rash on her back.  ECG: Dec 26, 2011-normal sinus rhythm. The P wave is very small.   Assessment / Plan:

## 2012-04-17 NOTE — Telephone Encounter (Signed)
WILL ROUTE TO TRIAGE

## 2012-04-17 NOTE — Assessment & Plan Note (Signed)
Donna Ortega presents with rapid atrial fibrillation. We've tried various antiarrhythmic drugs and she is unable to take them. She's on metoprolol and diltiazem. We'll increase her metoprolol to 75 mg twice a day and we will add digoxin 0.125 mg a day. I'll see her in 3 months for an office visit, EKG, and digoxin level.

## 2012-04-17 NOTE — Telephone Encounter (Signed)
pt has mix up on meds at the drug store

## 2012-04-17 NOTE — Patient Instructions (Addendum)
Your physician recommends that you schedule a follow-up appointment in: 1 MONTH WITH LORI   Your physician recommends that you schedule a follow-up appointment in: 3 MONTHS WITH DR [facility]  Your physician has recommended you make the following change in your medication:   START DIGOXIN 0.125 MG DAILY INCREASE METOPROLOL TO 75 MG DAILY  Your physician recommends that you return for lab work in: 3 MONTHS- DIGOXIN LEVEL   Digoxin Toxicity Digoxin is a medicine that can help a weakened heart to function properly. Digoxin increases the strength of the heart muscle, helps to maintain a normal heart rhythm, and helps to remove excess water from the body. Digoxin can relieve symptoms of heart failure. Heart failure is a condition that reduces the ability of the heart to pump enough blood through the body. Symptoms that digoxin can relieve include swelling of the feet and legs, difficulty breathing, and extreme tiredness or weakness.  Digoxin is a good medicine for helping the heart. However, if too much medicine is taken, it acts like a poison (toxic). The body may begin to overreact or have side effects from the dose. SIDE EFFECTS YOU SHOULD REPORT TO YOUR CAREGIVER AS SOON AS POSSIBLE:  Anxiousness or nervousness.   Changes in color vision (more yellow color), blurred vision, eyes sensitive to light, light flashes, or halos around bright lights.   Changes in behavior, mood, mental ability, or confusion.   Chest pain or irregular heartbeats (palpitations).   Diarrhea or constipation.   Dizziness or drowsiness.   Fainting spells, fast or irregular heartbeat (more likely in children).   Headache.   Irregular, slow heartbeat (less than 50 beats per minute).   Loss of appetite, feeling sick to your stomach (nausea), vomiting, or stomach pain.   Skin rash or itching.   Tingling or numbness in the hands or feet, unusual bruising, or pinpoint red spots on the skin.   Weakness or tiredness.    Breast enlargement in men and women and sexual problems such as impotence.  HOME CARE INSTRUCTIONS   If you miss a dose, take it as soon as you can. If it is nearly time for your next dose, take only that one dose. Do not take double or extra doses.   Swallow medicines with water. It is best to take digoxin on an empty stomach, at least 1 hour before or 2 hours after meals. Take your doses at regular intervals. Do not take your medicine more often than directed.   Tell your caregiver about all other medicines you are taking, including nonprescription medicines, nutritional supplements, or herbal products.   Tell your caregiver if you frequently have drinks with caffeine or alcohol, you smoke, or you use street drugs. This may affect the way your medicine works. Check with your caregiver before stopping or starting any of your medicines.   Watch your diet. Less digoxin may be absorbed from the stomach if you have a diet high in bran fiber.   If you are going to have surgery, tell your caregiver that you are taking digoxin.   Do not take antacids or treat yourself with nonprescription medicines for pain, allergies, coughs, or colds without advice from your caregiver.   Check your heart rate and blood pressure regularly. Ask your caregiver what your heart rate and blood pressure should be and when you should contact him or her. You may also need regular blood tests and tests to record the electrical activity of the heart (electrocardiography, EKG).  Digoxin  tablets are easily confused with other look-alike tablets. If you take other tablets that look similar, ask your pharmacist how to avoid mix-ups and complications. SEEK IMMEDIATE MEDICAL CARE IF:   You develop chest pain or shortness of breath.   You have fainting spells, or a fast or irregular heartbeat.   You have a slow heartbeat (less than 50 beats per minute).  MAKE SURE YOU:   Understand these instructions.   Will watch your  condition.   Will get help right away if you are not doing well or get worse.  Document Released: 07/22/2002 Document Revised: 07/21/2011 Document Reviewed: 03/19/2008 [facility] Patient Information 2012 [place], [place].

## 2012-04-17 NOTE — Assessment & Plan Note (Signed)
Donna Ortega is doing well from a cardiac standpoint. She's not having any episodes of angina. Continue same medications for coronary artery disease.

## 2012-04-24 ENCOUNTER — Telehealth: Payer: Self-pay | Admitting: Cardiovascular Disease

## 2012-04-24 NOTE — Telephone Encounter (Signed)
Medical records has paperwork, currently at [facility] as of 04/19/12, pt informed it will take 7-10 days, pt verbalized understanding.

## 2012-04-24 NOTE — Telephone Encounter (Signed)
Pt dropped paperwork off for dr Graciela Husbands, I will forward to DR/nurse.

## 2012-04-24 NOTE — Telephone Encounter (Signed)
Pt wants to talk to jodette re paperwork she dropped off

## 2012-05-02 ENCOUNTER — Telehealth: Payer: Self-pay | Admitting: *Deleted

## 2012-05-02 NOTE — Telephone Encounter (Signed)
Pt called to inform her I have mailed her physician statement paper she needed to her address, copy made.

## 2012-05-17 ENCOUNTER — Encounter: Payer: Self-pay | Admitting: Nurse Practitioner

## 2012-05-17 ENCOUNTER — Ambulatory Visit (INDEPENDENT_AMBULATORY_CARE_PROVIDER_SITE_OTHER): Payer: Medicare Other | Admitting: Nurse Practitioner

## 2012-05-17 VITALS — BP 116/56 | HR 65 | Ht 64.0 in | Wt 138.0 lb

## 2012-05-17 DIAGNOSIS — I4891 Unspecified atrial fibrillation: Secondary | ICD-10-CM

## 2012-05-17 NOTE — Progress Notes (Signed)
[name] Date of Birth: 1926/05/13 Medical Record #[id number]  History of Present Illness: [name] is seen back today for a one month visit. She is seen for Dr. Elease Hashimoto. She has CAD, remote CABG and atrial fib. On Pradaxa. Has had pacemaker placed earlier this year. She is intolerant to antiarrhythmic therapy and is now managed with rate control and anticoagulation. Her other issues include HLD and HTN.   She comes in today. She is here alone. She is doing well. She says she feels pretty good. Not really having any palpitations or fast heart beating. No chest pain. Trying to get more active. She just took her morning medicines which included her digoxin.   Current Outpatient Prescriptions on File Prior to Visit  Medication Sig Dispense Refill  . Calcium Carbonate-Vitamin D (CALTRATE 600+D PO) Take 1 tablet by mouth daily.       . dabigatran (PRADAXA) 150 MG CAPS Take 150 mg by mouth every 12 (twelve) hours.        . digoxin (LANOXIN) 0.125 MG tablet Take 1 tablet (0.125 mg total) by mouth daily.  90 tablet  3  . diltiazem (CARDIZEM CD) 240 MG 24 hr capsule Take 1 capsule (240 mg total) by mouth daily.  40 capsule  0  . lisinopril (PRINIVIL,ZESTRIL) 40 MG tablet Take 40 mg by mouth daily.        . metoprolol (LOPRESSOR) 50 MG tablet Take 1.5 tablets (75 mg total) by mouth 2 (two) times daily.  90 tablet  5  . Multiple Vitamin (MULTIVITAMIN) tablet Take 1 tablet by mouth daily.        . pravastatin (PRAVACHOL) 40 MG tablet Take 1 tablet (40 mg total) by mouth daily.  30 tablet  7  . vitamin C (ASCORBIC ACID) 500 MG tablet Take 500 mg by mouth daily.        Allergies  Allergen Reactions  . Amiodarone Hives    ALL OVER BODY HIVES/ ITCH  . Aspirin     Gi bleed  . Lipitor (Atorvastatin Calcium)     MUSCLE ACHES  . Niaspan (Niacin Er) Rash    Past Medical History  Diagnosis Date  . Coronary artery disease 2007    3 vessel CABG with LIMA-LAD, SVG to diag, and SVG - Ramus  .  Hyperlipidemia   . Hypertension   . PAF (paroxysmal atrial fibrillation)     had rash with amiodarone; no anticoagulation due to GI bleed in December 2012  . History of hyperkalemia   . Hyponatremia   . Diverticulitis   . Palpitations   . History of GI bleed Dec 2012  . Chronic anticoagulation     on Pradaxa    Past Surgical History  Procedure Date  . Maze   . Coronary artery bypass graft Feb 2007    Dr. Laneta Simmers;   Marland Kitchen Partial hysterectomy   . Esophagogastroduodenoscopy 08/04/2011    Procedure: ESOPHAGOGASTRODUODENOSCOPY (EGD);  Surgeon: Florencia Reasons, MD;  Location: [facility] ENDOSCOPY;  Service: Endoscopy;  Laterality: N/A;  do at bedside  . Abdominal hysterectomy     History  Smoking status  . Never Smoker   Smokeless tobacco  . Not on file    History  Alcohol Use No    Family History  Problem Relation Age of Onset  . Heart failure Father   . Kidney failure Father   . Bone cancer Brother   . Kidney failure Sister   . Colon cancer Sister   .  Cancer Sister     Review of Systems: The review of systems is per the HPI.  All other systems were reviewed and are negative.  Physical Exam: BP 116/56  Pulse 65  Ht 5\' 4"  (1.626 m)  Wt 138 lb (62.596 kg)  BMI 23.69 kg/m2 Patient is very pleasant and in no acute distress. Skin is warm and dry. Color is normal.  HEENT is unremarkable. Normocephalic/atraumatic. PERRL. Sclera are nonicteric. Neck is supple. No masses. No JVD. Lungs are clear. Cardiac exam shows a fairly regular rate and rhythm. Abdomen is soft. Extremities are without edema. Gait and ROM are intact. No gross neurologic deficits noted.  LABORATORY DATA: N/A  Lab Results  Component Value Date   WBC 6.5 02/09/2012   HGB 12.2 02/09/2012   HCT 35.8* 02/09/2012   PLT 168 02/09/2012   GLUCOSE 130* 02/06/2012   CHOL 177 08/31/2011   TRIG 46.0 08/31/2011   HDL 86.50 08/31/2011   LDLCALC 81 08/31/2011   ALT 24 08/31/2011   AST 24 08/31/2011   NA 136 02/06/2012   K 4.4  02/06/2012   CL 101 02/06/2012   CREATININE 1.0 02/06/2012   BUN 29* 02/06/2012   CO2 27 02/06/2012   TSH 1.349 08/22/2011   INR 1.3* 02/06/2012    Assessment / Plan: 1. Atrial fib with prior RVR - her rate looks good. She is doing well. Tolerating her medicines. Will try to check a digoxin level next week. She has just taken this morning. Will see her back in December as planned.   2. CAD - no chest pain. Encouraged her to stay active  3. Chronic anticoagulation - on Pradaxa. Samples given today.  Overall, I think she is doing well. Will check labs next week. See her back in December as planned.   Patient is agreeable to this plan and will call if any problems develop in the interim.

## 2012-05-17 NOTE — Patient Instructions (Addendum)
Come next Tuesday morning for your labs. Do not take your medicines until after your lab work that morning  Stay on your current medicines  Stay active  See Dr. Elease Hashimoto in December  Call the [facility] office at [phone] if you have any questions, problems or concerns.

## 2012-05-21 ENCOUNTER — Telehealth: Payer: Self-pay | Admitting: Cardiovascular Disease

## 2012-05-21 MED ORDER — DABIGATRAN ETEXILATE MESYLATE 150 MG PO CAPS: 150.0000 mg | ORAL_CAPSULE | Freq: Two times a day (BID) | ORAL | Status: DC

## 2012-05-21 NOTE — Telephone Encounter (Signed)
No samples available pt informed and med ordered.

## 2012-05-21 NOTE — Telephone Encounter (Signed)
Patient coming in tomorrow 10/8 to see Dr. Graciela Husbands, would like Jodette to leave her some pradaxa samples to be pickedup during this appnt.

## 2012-05-22 ENCOUNTER — Encounter: Payer: Self-pay | Admitting: Internal Medicine

## 2012-05-22 ENCOUNTER — Other Ambulatory Visit (INDEPENDENT_AMBULATORY_CARE_PROVIDER_SITE_OTHER): Payer: Medicare Other

## 2012-05-22 ENCOUNTER — Ambulatory Visit (INDEPENDENT_AMBULATORY_CARE_PROVIDER_SITE_OTHER): Payer: Medicare Other | Admitting: Internal Medicine

## 2012-05-22 VITALS — BP 139/80 | HR 65 | Ht 64.0 in | Wt 136.4 lb

## 2012-05-22 DIAGNOSIS — I498 Other specified cardiac arrhythmias: Secondary | ICD-10-CM

## 2012-05-22 DIAGNOSIS — I10 Essential (primary) hypertension: Secondary | ICD-10-CM

## 2012-05-22 DIAGNOSIS — I2510 Atherosclerotic heart disease of native coronary artery without angina pectoris: Secondary | ICD-10-CM

## 2012-05-22 DIAGNOSIS — Z950 Presence of cardiac pacemaker: Secondary | ICD-10-CM

## 2012-05-22 DIAGNOSIS — I4891 Unspecified atrial fibrillation: Secondary | ICD-10-CM

## 2012-05-22 DIAGNOSIS — I495 Sick sinus syndrome: Secondary | ICD-10-CM

## 2012-05-22 DIAGNOSIS — I471 Supraventricular tachycardia: Secondary | ICD-10-CM

## 2012-05-22 LAB — BASIC METABOLIC PANEL
BUN: 16 mg/dL (ref 6–23)
CO2: 25 mEq/L (ref 19–32)
Calcium: 9 mg/dL (ref 8.4–10.5)
Chloride: 102 mEq/L (ref 96–112)
Creatinine, Ser: 0.7 mg/dL (ref 0.4–1.2)
GFR: 82 mL/min (ref >60.00)
Glucose, Bld: 94 mg/dL (ref 70–99)
Potassium: 4.1 mEq/L (ref 3.5–5.1)
Sodium: 134 mEq/L — ABNORMAL LOW (ref 135–145)

## 2012-05-22 MED ORDER — TRIAMTERENE-HCTZ 37.5-25 MG PO TABS: ORAL_TABLET | ORAL | Status: DC

## 2012-05-22 NOTE — Assessment & Plan Note (Signed)
I have reviewed electrocardiograms dating back 2012. This was prompted by review of tachycardia arrhythmias identified by her pacemaker which turned out not to be atrial fibrillation but rather atrial tachycardia. Retrospectively, ECGs particularly from 2012 which were read as atrial fibrillation are incorrect demonstrate atrial tachycardia with occasional PACs contributing to irregularity. As such, we will discontinue her Pradaxa. We will resume aspirin and low dose because she has coronary artery disease with prior bypass surgery.

## 2012-05-22 NOTE — Assessment & Plan Note (Signed)
As above.

## 2012-05-22 NOTE — Assessment & Plan Note (Signed)
Stable; as above will add aspirin

## 2012-05-22 NOTE — Assessment & Plan Note (Signed)
The patient's device was interrogated and the information was fully reviewed.  The device was reprogrammed to maximize longevity  

## 2012-05-22 NOTE — Progress Notes (Signed)
Patient Care Team: Aura Dials, MD as PCP - General (Family Medicine) Aura Dials, MD (Family Medicine)   HPI  Donna Ortega is a 78 y.o. 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    Has history of atrial fibrillation dating back 5 years for which amiod was tried along the way but was not tolerated A few months ago she was very ill.  She has carried a diagnosis of  afib with RVR and cardizaem was initiated.Marland Kitchen an event recorder   shows pauses >5 sec with post termination as well as HR>130  She is s/p CABG, has normal LV function   Because of an abnormal CHADS-VASc score she was started on Pradaxa. She carries a diagnosis of aspirin related to a bleeding ulcer. Her breathing has been stable. She has recently noticed a little bit of edema. She is on no added salt diet   Past Medical History  Diagnosis Date  . Coronary artery disease 2007    3 vessel CABG with LIMA-LAD, SVG to diag, and SVG - Ramus  . Hyperlipidemia   . Hypertension   . PAF (paroxysmal atrial fibrillation)     had rash with amiodarone; no anticoagulation due to GI bleed in December 2012  . History of hyperkalemia   . Hyponatremia   . Diverticulitis   . Palpitations   . History of GI bleed Dec 2012  . Chronic anticoagulation     on Pradaxa    Past Surgical History  Procedure Date  . Maze   . Coronary artery bypass graft Feb 2007    Dr. Laneta Simmers;   Marland Kitchen Partial hysterectomy   . Esophagogastroduodenoscopy 08/04/2011    Procedure: ESOPHAGOGASTRODUODENOSCOPY (EGD);  Surgeon: Florencia Reasons, MD;  Location: [facility] ENDOSCOPY;  Service: Endoscopy;  Laterality: N/A;  do at bedside  . Abdominal hysterectomy     Current Outpatient Prescriptions  Medication Sig Dispense Refill  . Calcium Carbonate-Vitamin D (CALTRATE 600+D PO) Take 1 tablet by mouth daily.       . dabigatran (PRADAXA) 150 MG CAPS Take 1 capsule (150 mg total) by mouth every 12 (twelve) hours.  60 capsule   5  . digoxin (LANOXIN) 0.125 MG tablet Take 1 tablet (0.125 mg total) by mouth daily.  90 tablet  3  . diltiazem (CARDIZEM CD) 240 MG 24 hr capsule Take 1 capsule (240 mg total) by mouth daily.  40 capsule  0  . lisinopril (PRINIVIL,ZESTRIL) 40 MG tablet Take 40 mg by mouth daily.        . metoprolol (LOPRESSOR) 50 MG tablet Take 1.5 tablets (75 mg total) by mouth 2 (two) times daily.  90 tablet  5  . Multiple Vitamin (MULTIVITAMIN) tablet Take 1 tablet by mouth daily.        . pravastatin (PRAVACHOL) 40 MG tablet Take 1 tablet (40 mg total) by mouth daily.  30 tablet  7  . vitamin C (ASCORBIC ACID) 500 MG tablet Take 500 mg by mouth daily.        Allergies  Allergen Reactions  . Amiodarone Hives    ALL OVER BODY HIVES/ ITCH  . Aspirin     Gi bleed  . Lipitor (Atorvastatin Calcium)     MUSCLE ACHES  . Niaspan (Niacin Er) Rash    Review of Systems negative except from HPI and PMH  Physical Exam BP 139/80  Pulse 65  Ht 5\' 4"  (1.626 m)  Wt 136 lb  6.4 oz (61.871 kg)  BMI 23.41 kg/m2  SpO2 97% Well developed and well nourished in no acute distress HENT normal E scleral and icterus clear Neck Supple JVP flat; carotids brisk and full Clear to ausculation Device pocket well healed; without hematoma or erythema  Regular rate and rhythm, no murmurs gallops or rub Soft with active bowel sounds No clubbing cyanosis Trace Edema Alert and oriented, grossly normal motor and sensory function Skin Warm and Dry  Electrocardiogram demonstrates atrial pacing at 64 Intervals 22/08/40 Nonspecific ST-T changes  Assessment and  Plan

## 2012-05-22 NOTE — Patient Instructions (Addendum)
Your physician wants you to follow-up in: June 2014 with Dr Graciela Husbands.  You will receive a reminder letter in the mail two months in advance. If you don't receive a letter, please call our office to schedule the follow-up appointment.  Stop Pradaxa.  Start Maxzide 37.5/25mg  1/2 tablet every other day.

## 2012-05-22 NOTE — Assessment & Plan Note (Signed)
Stable post pacing 

## 2012-05-22 NOTE — Assessment & Plan Note (Signed)
She is having some edema with her blood pressure. Discontinue Lasix and potassium which he doesn't take any way her on low-dose Maxzide used every other day

## 2012-05-23 LAB — DIGOXIN LEVEL: Digoxin Level: 0.5 ng/mL — ABNORMAL LOW (ref 0.8–2.0)

## 2012-06-19 DIAGNOSIS — E785 Hyperlipidemia, unspecified: Secondary | ICD-10-CM | POA: Insufficient documentation

## 2012-07-16 ENCOUNTER — Other Ambulatory Visit: Payer: Self-pay | Admitting: *Deleted

## 2012-07-16 MED ORDER — PRAVASTATIN SODIUM 40 MG PO TABS: 40.0000 mg | ORAL_TABLET | Freq: Every day | ORAL | Status: DC

## 2012-07-16 NOTE — Telephone Encounter (Signed)
Opened in Error.

## 2012-07-16 NOTE — Telephone Encounter (Signed)
Fax Received. Refill Completed. Donna Ortega (R.M.A)   

## 2012-07-17 ENCOUNTER — Ambulatory Visit (INDEPENDENT_AMBULATORY_CARE_PROVIDER_SITE_OTHER): Payer: Medicare Other | Admitting: Cardiovascular Disease

## 2012-07-17 ENCOUNTER — Other Ambulatory Visit: Payer: Self-pay | Admitting: *Deleted

## 2012-07-17 ENCOUNTER — Other Ambulatory Visit (INDEPENDENT_AMBULATORY_CARE_PROVIDER_SITE_OTHER): Payer: Medicare Other

## 2012-07-17 ENCOUNTER — Encounter: Payer: Self-pay | Admitting: Cardiovascular Disease

## 2012-07-17 VITALS — BP 144/78 | HR 74 | Ht 64.0 in | Wt 139.0 lb

## 2012-07-17 DIAGNOSIS — I2510 Atherosclerotic heart disease of native coronary artery without angina pectoris: Secondary | ICD-10-CM

## 2012-07-17 DIAGNOSIS — I4891 Unspecified atrial fibrillation: Secondary | ICD-10-CM

## 2012-07-17 DIAGNOSIS — E785 Hyperlipidemia, unspecified: Secondary | ICD-10-CM

## 2012-07-17 DIAGNOSIS — R0989 Other specified symptoms and signs involving the circulatory and respiratory systems: Secondary | ICD-10-CM

## 2012-07-17 DIAGNOSIS — R002 Palpitations: Secondary | ICD-10-CM

## 2012-07-17 LAB — HEPATIC FUNCTION PANEL
ALT: 19 U/L (ref 0–35)
Albumin: 4.5 g/dL (ref 3.5–5.2)
Total Protein: 7 g/dL (ref 6.0–8.3)

## 2012-07-17 LAB — LIPID PANEL
HDL: 75.5 mg/dL (ref >39.00)
Triglycerides: 99 mg/dL (ref 0.0–149.0)

## 2012-07-17 LAB — BASIC METABOLIC PANEL
CO2: 26 mEq/L (ref 19–32)
Calcium: 9.3 mg/dL (ref 8.4–10.5)
GFR: 60.26 mL/min (ref >60.00)
Sodium: 138 mEq/L (ref 135–145)

## 2012-07-17 LAB — LDL CHOLESTEROL, DIRECT: Direct LDL: 117.3 mg/dL

## 2012-07-17 NOTE — Assessment & Plan Note (Signed)
She is stable.  No angina.  Continue same meds

## 2012-07-17 NOTE — Patient Instructions (Addendum)
Your physician wants you to follow-up in: 6 MONTHS  You will receive a reminder letter in the mail two months in advance. If you don't receive a letter, please call our office to schedule the follow-up appointment.  Your physician recommends that you return for a FASTING lipid profile: TODAY AND IN 6 MONTHS  

## 2012-07-17 NOTE — Assessment & Plan Note (Signed)
Her heart rate is well controlled. She has a regular rhythm today. We'll get an EKG on her at her next visit.  She seems to be doing much better off the Pradaxa.  We will continue the ASA daily.

## 2012-07-17 NOTE — Progress Notes (Signed)
[name] Date of Birth  1926/04/06 [facility]     [place] Office  [address]    [address]   [address] [place], [place]  [address]    [place], [place]  [address] [phone]  Fax  [phone]  [phone]  Fax [phone]  Problems:  1. CAD 2. Atrial Fibrillation 3. Drug Rash 4. GI Bleed 5. Anemia   History of Present Illness:  Donna Ortega is an 78 year old female with a history of coronary artery disease and atrial fibrillation. She was admitted to the hospital in December with a GI bleed, anemia, and rapid atrial fibrillation. She was discharged with the additional medications of amiodarone and Cardizem CD.  She was seen in mid January and was doing fairly well.  She stopped her Amiodarone due to drug rash.  She still has generalized fatigue and generalized weakness. She wonders if it may be due to the Lasix and potassium.  April 17, 2012 She has been more fatigued recently.  She played golf this past weekend and still is not over the exertion.  Dec. 3, 2013 :  She is doing well.  Dr. Graciela Husbands stopped her Pradaxa due to GI bleed.    She is doing very well.   Current Outpatient Prescriptions on File Prior to Visit  Medication Sig Dispense Refill  . Calcium Carbonate-Vitamin D (CALTRATE 600+D PO) Take 1 tablet by mouth daily.       Marland Kitchen diltiazem (CARDIZEM CD) 240 MG 24 hr capsule Take 1 capsule (240 mg total) by mouth daily.  40 capsule  0  . lisinopril (PRINIVIL,ZESTRIL) 40 MG tablet Take 40 mg by mouth daily.        . metoprolol (LOPRESSOR) 50 MG tablet Take 1.5 tablets (75 mg total) by mouth 2 (two) times daily.  90 tablet  5  . Multiple Vitamin (MULTIVITAMIN) tablet Take 1 tablet by mouth daily.        . pravastatin (PRAVACHOL) 40 MG tablet Take 1 tablet (40 mg total) by mouth daily.  30 tablet  7  . triamterene-hydrochlorothiazide (MAXZIDE-25) 37.5-25 MG per tablet 1/2 tablet every other day.  30 tablet  11    Allergies  Allergen Reactions  .  Amiodarone Hives    ALL OVER BODY HIVES/ ITCH  . Aspirin     Gi bleed  . Lipitor (Atorvastatin Calcium)     MUSCLE ACHES  . Niaspan (Niacin Er) Rash    Past Medical History  Diagnosis Date  . Coronary artery disease 2007    3 vessel CABG with LIMA-LAD, SVG to diag, and SVG - Ramus  . Hyperlipidemia   . Hypertension   . History of atrial fibrillation; review of all ECG are most consistent with atrial tachycardia     had rash with amiodarone; no anticoagulation due to GI bleed in December 2012  . History of hyperkalemia   . Hyponatremia   . Diverticulitis   . Palpitations   . History of GI bleed Dec 2012    Past Surgical History  Procedure Date  . Maze   . Coronary artery bypass graft Feb 2007    Dr. Laneta Simmers;   Marland Kitchen Partial hysterectomy   . Esophagogastroduodenoscopy 08/04/2011    Procedure: ESOPHAGOGASTRODUODENOSCOPY (EGD);  Surgeon: Florencia Reasons, MD;  Location: [facility] ENDOSCOPY;  Service: Endoscopy;  Laterality: N/A;  do at bedside  . Abdominal hysterectomy     History  Smoking status  . Never Smoker   Smokeless tobacco  .  Not on file    History  Alcohol Use No    Family History  Problem Relation Age of Onset  . Heart failure Father   . Kidney failure Father   . Bone cancer Brother   . Kidney failure Sister   . Colon cancer Sister   . Cancer Sister     Reviw of Systems:  Reviewed in the HPI.  All other systems are negative.  Physical Exam: Blood pressure 144/78, pulse 74, height 5\' 4"  (1.626 m), weight 139 lb (63.05 kg), SpO2 97.00%. General: Well developed, well nourished, in no acute distress.  Head: Normocephalic, atraumatic, sclera non-icteric, mucus membranes are moist,   Neck: Supple. Negative for carotid bruits. JVD not elevated.  Lungs: Clear bilaterally to auscultation without wheezes, rales, or rhonchi. Breathing is unlabored.  Heart: RR with occasional premature beats  with normal S1 S2.    Abdomen: Soft, non-tender, non-distended with  normoactive bowel sounds. No hepatomegaly. No rebound/guarding. No obvious abdominal masses.  Msk:  Strength and tone appear normal for age.  Extremities: No clubbing or cyanosis. No edema.  Distal pedal pulses are 2+ and equal bilaterally.  Neuro: Alert and oriented X 3. Moves all extremities spontaneously.  Psych:  Responds to questions appropriately with a normal affect.   ECG:   Assessment / Plan:

## 2012-07-18 ENCOUNTER — Telehealth: Payer: Self-pay | Admitting: *Deleted

## 2012-07-18 DIAGNOSIS — E785 Hyperlipidemia, unspecified: Secondary | ICD-10-CM

## 2012-07-18 MED ORDER — PRAVASTATIN SODIUM 80 MG PO TABS: 80.0000 mg | ORAL_TABLET | Freq: Every day | ORAL | Status: DC

## 2012-07-18 NOTE — Telephone Encounter (Signed)
Pt agreed to new dose of pravastatin 80 mg, 3 month lab date set.

## 2012-07-18 NOTE — Telephone Encounter (Signed)
Message copied by Antony Odea on Wed Jul 18, 2012  3:41 PM ------      Message from: [name]      Created: Tue Jul 17, 2012  5:22 PM       Have her increase her to  Pravachol 80 mg.      She does not tolerate lipitor.

## 2012-08-16 ENCOUNTER — Ambulatory Visit: Payer: Medicare Other | Admitting: Internal Medicine

## 2012-10-02 ENCOUNTER — Encounter: Payer: Self-pay | Admitting: Internal Medicine

## 2012-10-02 ENCOUNTER — Ambulatory Visit (INDEPENDENT_AMBULATORY_CARE_PROVIDER_SITE_OTHER): Payer: Medicare Other | Admitting: Internal Medicine

## 2012-10-02 VITALS — BP 138/74 | HR 66 | Temp 97.0°F | Resp 18 | Ht 64.0 in | Wt 138.0 lb

## 2012-10-02 DIAGNOSIS — I2510 Atherosclerotic heart disease of native coronary artery without angina pectoris: Secondary | ICD-10-CM

## 2012-10-02 DIAGNOSIS — D62 Acute posthemorrhagic anemia: Secondary | ICD-10-CM

## 2012-10-02 DIAGNOSIS — I495 Sick sinus syndrome: Secondary | ICD-10-CM

## 2012-10-02 DIAGNOSIS — I10 Essential (primary) hypertension: Secondary | ICD-10-CM

## 2012-10-02 DIAGNOSIS — E785 Hyperlipidemia, unspecified: Secondary | ICD-10-CM

## 2012-10-02 LAB — LIPID PANEL: Cholesterol: 169 mg/dL (ref 0–200)

## 2012-10-02 LAB — CBC WITH DIFFERENTIAL/PLATELET
Basophils Absolute: 0 10*3/uL (ref 0.0–0.1)
Eosinophils Relative: 1 % (ref 0–5)
HCT: 36.4 % (ref 36.0–46.0)
Lymphocytes Relative: 19 % (ref 12–46)
Lymphs Abs: 1.4 10*3/uL (ref 0.7–4.0)
MCV: 91.9 fL (ref 78.0–100.0)
Monocytes Absolute: 0.5 10*3/uL (ref 0.1–1.0)
Neutro Abs: 5.3 10*3/uL (ref 1.7–7.7)
Platelets: 213 10*3/uL (ref 150–400)
RBC: 3.96 MIL/uL (ref 3.87–5.11)
RDW: 12.6 % (ref 11.5–15.5)
WBC: 7.2 10*3/uL (ref 4.0–10.5)

## 2012-10-02 LAB — COMPREHENSIVE METABOLIC PANEL
ALT: 14 U/L (ref 0–35)
AST: 20 U/L (ref 0–37)
Albumin: 4.5 g/dL (ref 3.5–5.2)
CO2: 23 mEq/L (ref 19–32)
Calcium: 9.5 mg/dL (ref 8.4–10.5)
Chloride: 101 mEq/L (ref 96–112)
Potassium: 4.3 mEq/L (ref 3.5–5.3)

## 2012-10-02 LAB — TSH: TSH: 1.091 u[IU]/mL (ref 0.350–4.500)

## 2012-10-02 NOTE — Patient Instructions (Addendum)
Schedule CPE   Return as needed

## 2012-10-02 NOTE — Progress Notes (Signed)
Subjective:    Patient ID: Donna Ortega, female    DOB: Aug 06, 1926, 79 y.o.   MRN: [id number]  HPI Donna Ortega is a new pt here for first visit.  Donna Ortega lives with her son in [place]  See problem list.  PMH of  CAD, afib/flutter and sinus node dysfxn S/P pacemaker.  She also has HTN, DJD.  She is S/P vetebral fractures and vetebroplasty which she notes helped greatly.  Sh ehas a history of diverticulitis in the past.    She is concerned over 2 issues  .  She tells me her Na was low in the past and she was anemic from an acute GI bleed  ??? Due to PUD or diverticulitis.  I have no old records from [employer].  I note she is on maxide but she tells me she only takes 1/2 pill every other day.    She uses Tylenol or an occasional Alleve for her back discomfort.    Allergies  Allergen Reactions  . Amiodarone Hives    ALL OVER BODY HIVES/ ITCH  . Aspirin     Gi bleed  . Lipitor (Atorvastatin Calcium)     MUSCLE ACHES  . Niaspan (Niacin Er) Rash   Past Medical History  Diagnosis Date  . Coronary artery disease 2007    3 vessel CABG with LIMA-LAD, SVG to diag, and SVG - Ramus  . Hyperlipidemia   . Hypertension   . History of atrial fibrillation; review of all ECG are most consistent with atrial tachycardia     had rash with amiodarone; no anticoagulation due to GI bleed in December 2012  . History of hyperkalemia   . Hyponatremia   . Diverticulitis   . Palpitations   . History of GI bleed Dec 2012   Past Surgical History  Procedure Laterality Date  . Maze    . Coronary artery bypass graft  Feb 2007    Dr. Laneta Simmers;   Marland Kitchen Partial hysterectomy    . Esophagogastroduodenoscopy  08/04/2011    Procedure: ESOPHAGOGASTRODUODENOSCOPY (EGD);  Surgeon: Florencia Reasons, MD;  Location: [facility] ENDOSCOPY;  Service: Endoscopy;  Laterality: N/A;  do at bedside  . Abdominal hysterectomy     History   Social History  . Marital Status: Single    Spouse Name: N/A    Number of Children: N/A  . Years of  Education: N/A   Occupational History  . Not on file.   Social History Main Topics  . Smoking status: Never Smoker   . Smokeless tobacco: Not on file  . Alcohol Use: No  . Drug Use: No  . Sexually Active: No   Other Topics Concern  . Not on file   Social History Narrative   Ambulatory usually independently, plays golf   Lives at home with family   Daughter in law Donna Ortega [phone]   Family History  Problem Relation Age of Onset  . Heart failure Father   . Kidney failure Father   . Bone cancer Brother   . Kidney failure Sister   . Colon cancer Sister   . Cancer Sister    Patient Active Problem List  Diagnosis  . CAD (coronary artery disease) s/p CABG  . Atrial fibrillation/flutter  . HTN (hypertension)  . Melena  . Hypotension  . Anemia associated with acute blood loss  . GI bleed  . Dyslipidemia  . Chronic kidney disease (CKD), stage III (moderate)  . Peptic ulcer disease with hemorrhage  .  S/P vertebroplasty  . UTI (lower urinary tract infection)  . Fatigue  . Confused  . Rash  . Sinus node dysfunction  . Syncope and collapse  . Pacemaker-[employer]tronic  . Atrial tachycardia   Current Outpatient Prescriptions on File Prior to Visit  Medication Sig Dispense Refill  . aspirin 81 MG tablet Take 81 mg by mouth daily.      . Calcium Carbonate-Vitamin D (CALTRATE 600+D PO) Take 1 tablet by mouth daily.       Marland Kitchen lisinopril (PRINIVIL,ZESTRIL) 40 MG tablet Take 40 mg by mouth daily.        . metoprolol (LOPRESSOR) 50 MG tablet Take 1.5 tablets (75 mg total) by mouth 2 (two) times daily.  90 tablet  5  . Multiple Vitamin (MULTIVITAMIN) tablet Take 1 tablet by mouth daily.        . Multiple Vitamins-Minerals (CENTRUM SILVER PO) Take by mouth daily.      . pravastatin (PRAVACHOL) 80 MG tablet Take 1 tablet (80 mg total) by mouth daily.  30 tablet  6  . triamterene-hydrochlorothiazide (MAXZIDE-25) 37.5-25 MG per tablet 1/2 tablet every other day.  30 tablet  11  .  diltiazem (CARDIZEM CD) 240 MG 24 hr capsule Take 1 capsule (240 mg total) by mouth daily.  40 capsule  0   No current facility-administered medications on file prior to visit.       Review of Systems    see HPI Objective:   Physical Exam  Physical Exam  Nursing note and vitals reviewed.  Constitutional: She is oriented to person, place, and time. She appears well-developed and well-nourished.  HENT:  Head: Normocephalic and atraumatic.  Cardiovascular: Normal rate and regular rhythm. Exam reveals no gallop and no friction rub.  No murmur heard.  Pulmonary/Chest: Breath sounds normal. She has no wheezes. She has no rales.  Neurological: She is alert and oriented to person, place, and time.  Skin: Skin is warm and dry.  Psychiatric: She has a normal mood and affect. Her behavior is normal.            Assessment & Plan:  History of hyponatremia:  I note sodium was low 2 months ago but will recheck today  History of anemia:  ?? Cause  Will need old records and  Will check CBC today  HTN  Continue current meds  CAD,  Sinus node dysfxn afib/flutter  S/P pacemaker  historyof diverticulitis ??? PUD  Will need old records.  Pt is to sign  Schedule CPE with me  Return prn

## 2012-10-12 ENCOUNTER — Telehealth: Payer: Self-pay | Admitting: *Deleted

## 2012-10-12 ENCOUNTER — Encounter: Payer: Self-pay | Admitting: *Deleted

## 2012-10-12 NOTE — Telephone Encounter (Signed)
Message copied by Mathews Robinsons on Fri Oct 12, 2012  8:43 AM ------      Message from: Raechel Chute D      Created: Thu Oct 11, 2012  8:04 AM       Ok to mail labs to pt ------

## 2012-10-16 ENCOUNTER — Other Ambulatory Visit: Payer: Medicare Other

## 2012-10-26 ENCOUNTER — Other Ambulatory Visit: Payer: Self-pay | Admitting: *Deleted

## 2012-10-26 NOTE — Telephone Encounter (Signed)
Opened in Error.

## 2012-10-29 ENCOUNTER — Other Ambulatory Visit: Payer: Self-pay | Admitting: *Deleted

## 2012-10-29 MED ORDER — TRIAMTERENE-HCTZ 37.5-25 MG PO TABS: ORAL_TABLET | ORAL | Status: DC

## 2012-10-30 ENCOUNTER — Other Ambulatory Visit: Payer: Medicare Other

## 2012-10-30 ENCOUNTER — Other Ambulatory Visit: Payer: Self-pay | Admitting: *Deleted

## 2012-10-30 DIAGNOSIS — E785 Hyperlipidemia, unspecified: Secondary | ICD-10-CM

## 2012-10-30 MED ORDER — LISINOPRIL 40 MG PO TABS: 40.0000 mg | ORAL_TABLET | Freq: Every day | ORAL | Status: DC

## 2012-10-30 MED ORDER — PRAVASTATIN SODIUM 80 MG PO TABS: 80.0000 mg | ORAL_TABLET | Freq: Every day | ORAL | Status: DC

## 2012-10-30 MED ORDER — METOPROLOL TARTRATE 50 MG PO TABS: 75.0000 mg | ORAL_TABLET | Freq: Two times a day (BID) | ORAL | Status: DC

## 2012-10-30 MED ORDER — DILTIAZEM HCL ER COATED BEADS 240 MG PO CP24: 240.0000 mg | ORAL_CAPSULE | Freq: Every day | ORAL | Status: DC

## 2012-11-05 ENCOUNTER — Telehealth: Payer: Self-pay | Admitting: Cardiovascular Disease

## 2012-11-05 MED ORDER — METOPROLOL TARTRATE 50 MG PO TABS: 75.0000 mg | ORAL_TABLET | Freq: Two times a day (BID) | ORAL | Status: DC

## 2012-11-05 NOTE — Telephone Encounter (Signed)
Pt requesting 10 day supply to [address] for metoprolol, pt's mail order is delayed

## 2012-11-07 ENCOUNTER — Other Ambulatory Visit: Payer: Self-pay | Admitting: *Deleted

## 2012-11-07 MED ORDER — METOPROLOL TARTRATE 50 MG PO TABS: 75.0000 mg | ORAL_TABLET | Freq: Two times a day (BID) | ORAL | Status: DC

## 2012-11-07 NOTE — Telephone Encounter (Signed)
Fax Received. Refill Completed. Milliani Herrada Chowoe (R.M.A)   

## 2012-11-09 ENCOUNTER — Other Ambulatory Visit (INDEPENDENT_AMBULATORY_CARE_PROVIDER_SITE_OTHER): Payer: Medicare Other

## 2012-11-09 DIAGNOSIS — E785 Hyperlipidemia, unspecified: Secondary | ICD-10-CM

## 2012-11-09 LAB — LIPID PANEL
Cholesterol: 159 mg/dL (ref 0–200)
HDL: 66.1 mg/dL (ref >39.00)
Triglycerides: 74 mg/dL (ref 0.0–149.0)
VLDL: 14.8 mg/dL (ref 0.0–40.0)

## 2012-11-09 LAB — HEPATIC FUNCTION PANEL
ALT: 17 U/L (ref 0–35)
Albumin: 4.2 g/dL (ref 3.5–5.2)
Total Protein: 7.2 g/dL (ref 6.0–8.3)

## 2012-11-09 LAB — BASIC METABOLIC PANEL
GFR: 63.31 mL/min (ref >60.00)
Potassium: 5.1 mEq/L (ref 3.5–5.1)
Sodium: 134 mEq/L — ABNORMAL LOW (ref 135–145)

## 2012-11-12 ENCOUNTER — Telehealth: Payer: Self-pay | Admitting: *Deleted

## 2012-11-12 MED ORDER — DILTIAZEM HCL ER COATED BEADS 240 MG PO CP24: 240.0000 mg | ORAL_CAPSULE | Freq: Every day | ORAL | Status: DC

## 2012-11-12 MED ORDER — TRIAMTERENE-HCTZ 37.5-25 MG PO TABS: ORAL_TABLET | ORAL | Status: DC

## 2012-11-12 NOTE — Telephone Encounter (Signed)
Pt states she's on 20mg  of Lisinopril, our last note says 40mg . Will route this to Dr, Elease Hashimoto nurse for further review

## 2012-11-13 ENCOUNTER — Telehealth: Payer: Self-pay | Admitting: Cardiovascular Disease

## 2012-11-13 ENCOUNTER — Other Ambulatory Visit: Payer: Medicare Other

## 2012-11-13 NOTE — Telephone Encounter (Signed)
PT WILL CONFIRM DOSE NEXT WEEK WHEN SHE GETS HOME, SHE CANT REMEMBER IF IT IS 20 MG OR 40 MG.

## 2012-11-13 NOTE — Telephone Encounter (Signed)
PT WILL CALL BACK WITH LISINOPRIL DOSE. THEN WE NEED TO REORDER.

## 2012-11-13 NOTE — Telephone Encounter (Signed)
New Prob   Pt is calling about a medication she is currently taking (lisinopril). Would like to speak to nurse.

## 2012-11-14 ENCOUNTER — Encounter: Payer: Self-pay | Admitting: Internal Medicine

## 2012-11-20 ENCOUNTER — Other Ambulatory Visit: Payer: Medicare Other

## 2012-11-22 ENCOUNTER — Telehealth: Payer: Self-pay | Admitting: Cardiovascular Disease

## 2012-11-22 NOTE — Telephone Encounter (Signed)
STRENGTH VERIFIED ON LISINOPRIL/ DOSE CORRECT.

## 2012-11-22 NOTE — Telephone Encounter (Signed)
PT IS ON LISINOPRIL 40 MG DAILY/ STRENGTH ON  2 BOTTLES VERIFIED.

## 2012-11-22 NOTE — Telephone Encounter (Signed)
Follow up    Was told to call back to speak with nurse regarding medication

## 2012-11-22 NOTE — Telephone Encounter (Signed)
Pt calling re question on lisinopril

## 2012-11-22 NOTE — Telephone Encounter (Signed)
MAR IS CORRECT PER PT

## 2013-01-10 ENCOUNTER — Telehealth: Payer: Self-pay | Admitting: Cardiovascular Disease

## 2013-01-10 NOTE — Telephone Encounter (Signed)
Digoxin was stopped on 05/22/12 during Dr Odessa Fleming visit. Dr Elease Hashimoto confirmed pt is not to be on digoxin. Pt verbalized understanding.

## 2013-01-10 NOTE — Telephone Encounter (Signed)
New Problem:    Patient called in wanting to know if her digoxin was ordered.  Please call back.

## 2013-01-14 ENCOUNTER — Other Ambulatory Visit: Payer: Self-pay | Admitting: *Deleted

## 2013-01-14 ENCOUNTER — Telehealth: Payer: Self-pay | Admitting: Cardiovascular Disease

## 2013-01-14 DIAGNOSIS — E7800 Pure hypercholesterolemia, unspecified: Secondary | ICD-10-CM

## 2013-01-14 DIAGNOSIS — Z79899 Other long term (current) drug therapy: Secondary | ICD-10-CM

## 2013-01-14 NOTE — Telephone Encounter (Signed)
Received a phone message stating pt wanted to have her Digoxin called to pharmacy. Just called pt to see whats going on and she states Dr. Elease Hashimoto nurse told her not to take this medication and she will discuss this with Dr. Elease Hashimoto when she comes in for an appointment on the 01-15-13

## 2013-01-14 NOTE — Telephone Encounter (Signed)
Orders for lipid, bmet, and hfp put in system.

## 2013-01-14 NOTE — Telephone Encounter (Signed)
New Prob      Pt has an appointment tomorrow morning at 9:30 6/3 and is supposed to have fasting lab work to go along with it. Orders needed in EPIC for her lab work.

## 2013-01-15 ENCOUNTER — Encounter: Payer: Self-pay | Admitting: Cardiovascular Disease

## 2013-01-15 ENCOUNTER — Other Ambulatory Visit (INDEPENDENT_AMBULATORY_CARE_PROVIDER_SITE_OTHER): Payer: Medicare Other

## 2013-01-15 ENCOUNTER — Ambulatory Visit (INDEPENDENT_AMBULATORY_CARE_PROVIDER_SITE_OTHER): Payer: Medicare Other | Admitting: Cardiovascular Disease

## 2013-01-15 VITALS — BP 132/76 | HR 66 | Ht 64.0 in | Wt 132.1 lb

## 2013-01-15 DIAGNOSIS — I2510 Atherosclerotic heart disease of native coronary artery without angina pectoris: Secondary | ICD-10-CM

## 2013-01-15 DIAGNOSIS — I498 Other specified cardiac arrhythmias: Secondary | ICD-10-CM

## 2013-01-15 DIAGNOSIS — I4891 Unspecified atrial fibrillation: Secondary | ICD-10-CM

## 2013-01-15 DIAGNOSIS — I471 Supraventricular tachycardia: Secondary | ICD-10-CM

## 2013-01-15 DIAGNOSIS — R0989 Other specified symptoms and signs involving the circulatory and respiratory systems: Secondary | ICD-10-CM

## 2013-01-15 LAB — BASIC METABOLIC PANEL
CO2: 22 mEq/L (ref 19–32)
Calcium: 9.2 mg/dL (ref 8.4–10.5)
Creatinine, Ser: 0.9 mg/dL (ref 0.4–1.2)
Glucose, Bld: 101 mg/dL — ABNORMAL HIGH (ref 70–99)

## 2013-01-15 LAB — HEPATIC FUNCTION PANEL
Albumin: 4.1 g/dL (ref 3.5–5.2)
Total Protein: 7.1 g/dL (ref 6.0–8.3)

## 2013-01-15 LAB — LIPID PANEL
Cholesterol: 183 mg/dL (ref 0–200)
Total CHOL/HDL Ratio: 3
Triglycerides: 109 mg/dL (ref 0.0–149.0)

## 2013-01-15 NOTE — Assessment & Plan Note (Signed)
Donna Ortega is doing well.  No angina.

## 2013-01-15 NOTE — Patient Instructions (Addendum)
Your physician recommends that you return for a FASTING lipid profile: TODAY  Your physician wants you to follow-up in: 6 MONTHS WITH EKG You will receive a reminder letter in the mail two months in advance. If you don't receive a letter, please call our office to schedule the follow-up appointment.  Your physician recommends that you return for a FASTING lipid profile: 6 MONTHS

## 2013-01-15 NOTE — Assessment & Plan Note (Signed)
She has not had any further episodes of tachycardia.  She has had runs of atrial tachycardia which we thought were atrial fibrillation.  She does have some fatigue with the atenolol but has maintained SR.

## 2013-01-15 NOTE — Progress Notes (Signed)
[name] Date of Birth  [birth year] [facility]      Office  [address]    [address]   [address] [place], [place]  [address]    [place], [place]  [address] [phone]  Fax  [phone]  [phone]  Fax [phone]  Problems:  1. CAD 2. PACs 3. Drug Rash - amiodarone 4. GI Bleed 5. Anemia 6. hyponatremia   History of Present Illness:  Donna Ortega is an 79 year old female with a history of coronary artery disease and atrial fibrillation. She was admitted to the hospital in December with a GI bleed, anemia, and rapid atrial fibrillation. She was discharged with the additional medications of amiodarone and Cardizem CD.  She was seen in mid January and was doing fairly well.  She stopped her Amiodarone due to drug rash.  She still has generalized fatigue and generalized weakness. She wonders if it may be due to the Lasix and potassium.  April 17, 2012 She has been more fatigued recently.  She played golf this past weekend and still is not over the exertion.  Dec. 3, 2013 :  She is doing well.  Dr. Graciela Husbands stopped her Pradaxa due to GI bleed.    She is doing very well.   January 15, 2013:  Donna Ortega is doing OK.  She does not have as much energy as she would like.   She has continued to have problems with hyponatremia.  She is on maxzide which is likely exacerbating this.  No CP, no dyspnea.    Current Outpatient Prescriptions on File Prior to Visit  Medication Sig Dispense Refill  . aspirin 81 MG tablet Take 81 mg by mouth daily.      . Calcium Carbonate-Vitamin D (CALTRATE 600+D PO) Take 1 tablet by mouth daily.       Marland Kitchen diltiazem (CARDIZEM CD) 240 MG 24 hr capsule Take 1 capsule (240 mg total) by mouth daily.  90 capsule  3  . lisinopril (PRINIVIL,ZESTRIL) 40 MG tablet Take 1 tablet (40 mg total) by mouth daily.  90 tablet  1  . metoprolol (LOPRESSOR) 50 MG tablet Take 1.5 tablets (75 mg total) by mouth 2 (two) times daily.  90 tablet  5  . Multiple  Vitamin (MULTIVITAMIN) tablet Take 1 tablet by mouth daily.        . Multiple Vitamins-Minerals (CENTRUM SILVER PO) Take by mouth daily.      . pravastatin (PRAVACHOL) 80 MG tablet Take 1 tablet (80 mg total) by mouth daily.  90 tablet  1  . triamterene-hydrochlorothiazide (MAXZIDE-25) 37.5-25 MG per tablet 1/2 tablet every other day.  90 tablet  4   No current facility-administered medications on file prior to visit.    Allergies  Allergen Reactions  . Amiodarone Hives    ALL OVER BODY HIVES/ ITCH  . Aspirin     Gi bleed  . Lipitor (Atorvastatin Calcium)     MUSCLE ACHES  . Niaspan (Niacin Er) Rash    Past Medical History  Diagnosis Date  . Coronary artery disease 2007    3 vessel CABG with LIMA-LAD, SVG to diag, and SVG - Ramus  . Hyperlipidemia   . Hypertension   . History of atrial fibrillation; review of all ECG are most consistent with atrial tachycardia     had rash with amiodarone; no anticoagulation due to GI bleed in December 2012  . History of hyperkalemia   . Hyponatremia   . Diverticulitis   .  Palpitations   . History of GI bleed Dec 2012    Past Surgical History  Procedure Laterality Date  . Maze    . Coronary artery bypass graft  Feb 2007    Dr. Laneta Simmers;   Marland Kitchen Partial hysterectomy    . Esophagogastroduodenoscopy  08/04/2011    Procedure: ESOPHAGOGASTRODUODENOSCOPY (EGD);  Surgeon: Florencia Reasons, MD;  Location: [facility] ENDOSCOPY;  Service: Endoscopy;  Laterality: N/A;  do at bedside  . Abdominal hysterectomy      History  Smoking status  . Never Smoker   Smokeless tobacco  . Not on file    History  Alcohol Use No    Family History  Problem Relation Age of Onset  . Heart failure Father   . Kidney failure Father   . Bone cancer Brother   . Kidney failure Sister   . Colon cancer Sister   . Cancer Sister     Reviw of Systems:  Reviewed in the HPI.  All other systems are negative.  Physical Exam: Blood pressure 132/76, pulse 66, height 5\' 4"   (1.626 m), weight 132 lb 1.9 oz (59.929 kg). General: Well developed, well nourished, in no acute distress.  Head: Normocephalic, atraumatic, sclera non-icteric, mucus membranes are moist,   Neck: Supple. Negative for carotid bruits. JVD not elevated.  Lungs: Clear bilaterally to auscultation without wheezes, rales, or rhonchi. Breathing is unlabored.  Heart: RR with occasional premature beats  with normal S1 S2.    Abdomen: Soft, non-tender, non-distended with normoactive bowel sounds. No hepatomegaly. No rebound/guarding. No obvious abdominal masses.  Msk:  Strength and tone appear normal for age.  Extremities: No clubbing or cyanosis. No edema.  Distal pedal pulses are 2+ and equal bilaterally.  Neuro: Alert and oriented X 3. Moves all extremities spontaneously.  Psych:  Responds to questions appropriately with a normal affect.   ECG: January 15, 2013: electronic atrial pacer,  T wave abnormality,  Assessment / Plan:

## 2013-01-17 ENCOUNTER — Encounter: Payer: Self-pay | Admitting: Internal Medicine

## 2013-01-17 ENCOUNTER — Ambulatory Visit (INDEPENDENT_AMBULATORY_CARE_PROVIDER_SITE_OTHER): Payer: Medicare Other | Admitting: Internal Medicine

## 2013-01-17 VITALS — BP 125/80 | HR 89 | Temp 97.1°F | Resp 18 | Ht 64.0 in | Wt 134.0 lb

## 2013-01-17 DIAGNOSIS — I2510 Atherosclerotic heart disease of native coronary artery without angina pectoris: Secondary | ICD-10-CM

## 2013-01-17 DIAGNOSIS — I10 Essential (primary) hypertension: Secondary | ICD-10-CM

## 2013-01-17 DIAGNOSIS — N390 Urinary tract infection, site not specified: Secondary | ICD-10-CM

## 2013-01-17 DIAGNOSIS — N183 Chronic kidney disease, stage 3 unspecified: Secondary | ICD-10-CM

## 2013-01-17 DIAGNOSIS — D62 Acute posthemorrhagic anemia: Secondary | ICD-10-CM

## 2013-01-17 DIAGNOSIS — R634 Abnormal weight loss: Secondary | ICD-10-CM

## 2013-01-17 DIAGNOSIS — R5383 Other fatigue: Secondary | ICD-10-CM

## 2013-01-17 DIAGNOSIS — R5381 Other malaise: Secondary | ICD-10-CM

## 2013-01-17 LAB — CBC WITH DIFFERENTIAL/PLATELET
Basophils Relative: 0 % (ref 0–1)
Hemoglobin: 13.1 g/dL (ref 12.0–15.0)
Lymphs Abs: 1.6 10*3/uL (ref 0.7–4.0)
MCHC: 35.2 g/dL (ref 30.0–36.0)
Monocytes Relative: 8 % (ref 3–12)
Neutro Abs: 5 10*3/uL (ref 1.7–7.7)
Neutrophils Relative %: 69 % (ref 43–77)
RBC: 4.19 MIL/uL (ref 3.87–5.11)

## 2013-01-17 LAB — TSH: TSH: 1.207 u[IU]/mL (ref 0.350–4.500)

## 2013-01-17 MED ORDER — CIPROFLOXACIN HCL 250 MG PO TABS: 250.0000 mg | ORAL_TABLET | Freq: Two times a day (BID) | ORAL | Status: DC

## 2013-01-17 NOTE — Progress Notes (Signed)
Subjective:    Patient ID: Donna Ortega, female    DOB: 05-Nov-1925, 79 y.o.   MRN: [id number]  HPI Donna Ortega is here for CPE  She notes her appetite is down and she has lost 4 lbs since her Feburary visit with me.    She is not sure about her immunizations and I do not have old records on her.    She is off Pradaxa since her GI bleed in December,  Upper endoscopy was negative at that time and she has been told to resume her daily ASA.  No dark or melenic stools  She tells me she has reduced her Water intake because of a low sodium.  I note labs done 2 days ago reveal normal Na at 139.    She cannot remember her last dexa.   Pt does not like to have others  Drive for her.     She has not seen an opthalmologist or a dermatologist in quite some time  See U/A   Pt is asymptomatic  Allergies  Allergen Reactions  . Amiodarone Hives    ALL OVER BODY HIVES/ ITCH  . Aspirin     Gi bleed  . Lipitor (Atorvastatin Calcium)     MUSCLE ACHES  . Niaspan (Niacin Er) Rash   Past Medical History  Diagnosis Date  . Coronary artery disease 2007    3 vessel CABG with LIMA-LAD, SVG to diag, and SVG - Ramus  . Hyperlipidemia   . Hypertension   . History of atrial fibrillation; review of all ECG are most consistent with atrial tachycardia     had rash with amiodarone; no anticoagulation due to GI bleed in December 2012  . History of hyperkalemia   . Hyponatremia   . Diverticulitis   . Palpitations   . History of GI bleed Dec 2012   Past Surgical History  Procedure Laterality Date  . Maze    . Coronary artery bypass graft  Feb 2007    Dr. Laneta Simmers;   Marland Kitchen Partial hysterectomy    . Esophagogastroduodenoscopy  08/04/2011    Procedure: ESOPHAGOGASTRODUODENOSCOPY (EGD);  Surgeon: Florencia Reasons, MD;  Location: [facility] ENDOSCOPY;  Service: Endoscopy;  Laterality: N/A;  do at bedside  . Abdominal hysterectomy    . Pace maker     History   Social History  . Marital Status: Single    Spouse Name: N/A     Number of Children: N/A  . Years of Education: N/A   Occupational History  . Not on file.   Social History Main Topics  . Smoking status: Never Smoker   . Smokeless tobacco: Not on file  . Alcohol Use: No  . Drug Use: No  . Sexually Active: No   Other Topics Concern  . Not on file   Social History Narrative   Ambulatory usually independently, plays golf   Lives at home with family   Daughter in law Sander Speckman [phone]   Family History  Problem Relation Age of Onset  . Heart failure Father   . Kidney failure Father   . Bone cancer Brother   . Kidney failure Sister   . Colon cancer Sister   . Cancer Sister    Patient Active Problem List   Diagnosis Date Noted  . Atrial tachycardia 05/22/2012  . Pacemaker-Medtronic 02/09/2012  . Sinus node dysfunction 02/06/2012  . Syncope and collapse 02/06/2012  . Rash 09/19/2011  . UTI (lower urinary tract infection) 08/21/2011  .  Fatigue 08/21/2011  . Confused 08/21/2011  . Peptic ulcer disease with hemorrhage 08/14/2011  . S/P vertebroplasty 08/14/2011  . Chronic kidney disease (CKD), stage III (moderate) 08/05/2011  . Anemia associated with acute blood loss 08/04/2011  . GI bleed 08/04/2011  . Dyslipidemia 08/04/2011  . Melena 08/03/2011  . Hypotension 08/03/2011  . CAD (coronary artery disease) s/p CABG 01/11/2011  . Atrial fibrillation/flutter 01/11/2011  . HTN (hypertension) 01/11/2011   Current Outpatient Prescriptions on File Prior to Visit  Medication Sig Dispense Refill  . aspirin 81 MG tablet Take 81 mg by mouth daily.      . Calcium Carbonate-Vitamin D (CALTRATE 600+D PO) Take 1 tablet by mouth daily.       Marland Kitchen diltiazem (CARDIZEM CD) 240 MG 24 hr capsule Take 1 capsule (240 mg total) by mouth daily.  90 capsule  3  . lisinopril (PRINIVIL,ZESTRIL) 40 MG tablet Take 1 tablet (40 mg total) by mouth daily.  90 tablet  1  . metoprolol (LOPRESSOR) 50 MG tablet Take 1.5 tablets (75 mg total) by mouth 2 (two) times  daily.  90 tablet  5  . Multiple Vitamins-Minerals (CENTRUM SILVER PO) Take by mouth daily.      . pravastatin (PRAVACHOL) 80 MG tablet Take 1 tablet (80 mg total) by mouth daily.  90 tablet  1  . triamterene-hydrochlorothiazide (MAXZIDE-25) 37.5-25 MG per tablet 1/2 tablet every other day.  90 tablet  4   No current facility-administered medications on file prior to visit.      Review of Systems     Objective:   Physical Exam  Physical Exam  Nursing note and vitals reviewed.  Constitutional: She is oriented to person, place, and time. She appears well-developed and well-nourished.  HENT:  Head: Normocephalic and atraumatic.  Right Ear: Tympanic membrane and ear canal normal. No drainage. Tympanic membrane is not injected and not erythematous.  Left Ear: Tympanic membrane and ear canal normal. No drainage. Tympanic membrane is not injected and not erythematous.  Nose: Nose normal. Right sinus exhibits no maxillary sinus tenderness and no frontal sinus tenderness. Left sinus exhibits no maxillary sinus tenderness and no frontal sinus tenderness.  Mouth/Throat: Oropharynx is clear and moist. No oral lesions. No oropharyngeal exudate.  Eyes: Conjunctivae and EOM are normal. Pupils are equal, round, and reactive to light.  Neck: Normal range of motion. Neck supple. No JVD present. Carotid bruit is not present. No mass and no thyromegaly present.  Cardiovascular: Normal rate, regular rhythm, S1 normal, S2 normal and intact distal pulses. Exam reveals no gallop and no friction rub.  No murmur heard.  Pulses:  Carotid pulses are 2+ on the right side, and 2+ on the left side.  Dorsalis pedis pulses are 2+ on the right side, and 2+ on the left side.  No carotid bruit. No LE edema  Pulmonary/Chest: Breath sounds normal. She has no wheezes. She has no rales. She exhibits no tenderness.  Abdominal: Soft. Bowel sounds are normal. She exhibits no distension and no mass. There is no  hepatosplenomegaly. There is no tenderness. There is no CVA tenderness.  Musculoskeletal: Normal range of motion.  No active synovitis to joints.  Lymphadenopathy:  She has no cervical adenopathy.  She has no axillary adenopathy.  Right: No inguinal and no supraclavicular adenopathy present.  Left: No inguinal and no supraclavicular adenopathy present.  Neurological: She is alert and oriented to person, place, and time. She has normal strength and normal reflexes. She displays no  tremor. No cranial nerve deficit or sensory deficit. Coordination and gait normal.  Skin: Skin is warm and dry. No rash noted. No cyanosis. Nails show no clubbing.  Psychiatric: She has a normal mood and affect. Her speech is normal and behavior is normal. Cognition and memory are normal.         Assessment & Plan:  Health Maintenance:  Will need old records.    Pt is to sign for records  UTI  Will send for culture  .  Will start on  Cipro 250 mg bid for 5 days  Weight loss  Check TSH  Encouraged liquid supplements. See me in one month for weigh check  History of GI bleed:  Has resumed low dose ASA  Guaiac neg today  Anemia will check CBC today  History of vertebral compression fracture  Will need to schedule DEXA.  Pt not sure she wants this test now  CAD/AFib/ pacemaker  On 81 mg ASA now.  Dyslipidemai  On Pravachol  Lipids look great    I have advised pt to drive only short distances and during the daytime  See me in one month

## 2013-01-17 NOTE — Patient Instructions (Addendum)
Skin doctor  Dr.  Emily Filbert or Dr.  Sharyn Lull  [phone]  [address]    Eye Doctor  Dr. Elmer Picker  [phone]  [address]  Get antibiotic and take as directed  See me in one month

## 2013-01-17 NOTE — Addendum Note (Signed)
Addended by: Mathews Robinsons on: 01/17/2013 02:57 PM   Modules accepted: Orders

## 2013-01-18 LAB — VITAMIN D 25 HYDROXY (VIT D DEFICIENCY, FRACTURES): Vit D, 25-Hydroxy: 53 ng/mL (ref 30–89)

## 2013-01-19 LAB — URINE CULTURE: Colony Count: >=100000

## 2013-01-21 ENCOUNTER — Telehealth: Payer: Self-pay | Admitting: *Deleted

## 2013-01-21 NOTE — Telephone Encounter (Signed)
Called Dr Matthias Hughs office pt had an endoscopy in 2013 but not a coloscopy

## 2013-01-22 ENCOUNTER — Telehealth: Payer: Self-pay | Admitting: Internal Medicine

## 2013-01-22 ENCOUNTER — Encounter: Payer: Self-pay | Admitting: *Deleted

## 2013-01-22 ENCOUNTER — Encounter: Payer: Self-pay | Admitting: Internal Medicine

## 2013-01-22 DIAGNOSIS — B3781 Candidal esophagitis: Secondary | ICD-10-CM | POA: Insufficient documentation

## 2013-01-22 DIAGNOSIS — K259 Gastric ulcer, unspecified as acute or chronic, without hemorrhage or perforation: Secondary | ICD-10-CM | POA: Insufficient documentation

## 2013-01-22 NOTE — Telephone Encounter (Signed)
Donna Ortega and let her know that she did have a urine infection with bacteria and that I need to see her back on July 7th to repeat her urine test and discuss vaccines with her.    Advise her to be sure to keep her appt on July 7th

## 2013-01-22 NOTE — Telephone Encounter (Signed)
Called pt regarding urine cx and F/U appt

## 2013-02-18 ENCOUNTER — Encounter: Payer: Self-pay | Admitting: Internal Medicine

## 2013-02-18 ENCOUNTER — Encounter: Payer: Self-pay | Admitting: *Deleted

## 2013-02-18 ENCOUNTER — Ambulatory Visit (INDEPENDENT_AMBULATORY_CARE_PROVIDER_SITE_OTHER): Payer: Medicare Other | Admitting: Internal Medicine

## 2013-02-18 VITALS — BP 100/66 | HR 67 | Temp 97.0°F | Resp 18

## 2013-02-18 DIAGNOSIS — Z87440 Personal history of urinary (tract) infections: Secondary | ICD-10-CM

## 2013-02-18 DIAGNOSIS — G4700 Insomnia, unspecified: Secondary | ICD-10-CM | POA: Insufficient documentation

## 2013-02-18 DIAGNOSIS — N390 Urinary tract infection, site not specified: Secondary | ICD-10-CM

## 2013-02-18 LAB — POCT URINALYSIS DIPSTICK
Blood, UA: NEGATIVE
Nitrite, UA: NEGATIVE
Protein, UA: NEGATIVE
Spec Grav, UA: 1.02
Urobilinogen, UA: NEGATIVE

## 2013-02-18 NOTE — Progress Notes (Signed)
Subjective:    Patient ID: AIBHLINN Donna Ortega, female    DOB: 01-09-1926, 79 y.o.   MRN: [id number]  HPI Donna Ortega is here for follow up ot UTI  Ecoli sensitive to Cipro  .  Feeling much better  She does report insomnia.  Had been on Ambien in the past.    Allergies  Allergen Reactions  . Amiodarone Hives    ALL OVER BODY HIVES/ ITCH  . Aspirin     Gi bleed  . Lipitor (Atorvastatin Calcium)     MUSCLE ACHES  . Niaspan (Niacin Er) Rash   Past Medical History  Diagnosis Date  . Coronary artery disease 2007    3 vessel CABG with LIMA-LAD, SVG to diag, and SVG - Ramus  . Hyperlipidemia   . Hypertension   . History of atrial fibrillation; review of all ECG are most consistent with atrial tachycardia     had rash with amiodarone; no anticoagulation due to GI bleed in December 2012  . History of hyperkalemia   . Hyponatremia   . Diverticulitis   . Palpitations   . History of GI bleed Dec 2012   Past Surgical History  Procedure Laterality Date  . Maze    . Coronary artery bypass graft  Feb 2007    Dr. Laneta Simmers;   Marland Kitchen Partial hysterectomy    . Esophagogastroduodenoscopy  08/04/2011    Procedure: ESOPHAGOGASTRODUODENOSCOPY (EGD);  Surgeon: Florencia Reasons, MD;  Location: [facility] ENDOSCOPY;  Service: Endoscopy;  Laterality: N/A;  do at bedside  . Abdominal hysterectomy    . Pace maker     History   Social History  . Marital Status: Single    Spouse Name: N/A    Number of Children: N/A  . Years of Education: N/A   Occupational History  . Not on file.   Social History Main Topics  . Smoking status: Never Smoker   . Smokeless tobacco: Not on file  . Alcohol Use: No  . Drug Use: No  . Sexually Active: No   Other Topics Concern  . Not on file   Social History Narrative   Ambulatory usually independently, plays golf   Lives at home with family   Daughter in law Tran Randle [phone]   Family History  Problem Relation Age of Onset  . Heart failure Father   . Kidney failure  Father   . Bone cancer Brother   . Kidney failure Sister   . Colon cancer Sister   . Cancer Sister    Patient Active Problem List   Diagnosis Date Noted  . Insomnia 02/18/2013  . Candida esophagitis 01/22/2013  . Gastric ulcer 01/22/2013  . Loss of weight 01/17/2013  . UTI (urinary tract infection) 01/17/2013  . Atrial tachycardia 05/22/2012  . Pacemaker-Medtronic 02/09/2012  . Sinus node dysfunction 02/06/2012  . Syncope and collapse 02/06/2012  . Rash 09/19/2011  . UTI (lower urinary tract infection) 08/21/2011  . Fatigue 08/21/2011  . Confused 08/21/2011  . Peptic ulcer disease with hemorrhage 08/14/2011  . S/P vertebroplasty 08/14/2011  . Chronic kidney disease (CKD), stage III (moderate) 08/05/2011  . Anemia associated with acute blood loss 08/04/2011  . GI bleed 08/04/2011  . Dyslipidemia 08/04/2011  . Melena 08/03/2011  . Hypotension 08/03/2011  . CAD (coronary artery disease) s/p CABG 01/11/2011  . Atrial fibrillation/flutter 01/11/2011  . HTN (hypertension) 01/11/2011   Current Outpatient Prescriptions on File Prior to Visit  Medication Sig Dispense Refill  . aspirin 81 MG  tablet Take 81 mg by mouth daily.      . Calcium Carbonate-Vitamin D (CALTRATE 600+D PO) Take 1 tablet by mouth daily.       Marland Kitchen diltiazem (CARDIZEM CD) 240 MG 24 hr capsule Take 1 capsule (240 mg total) by mouth daily.  90 capsule  3  . Ibuprofen-Diphenhydramine Cit (ADVIL PM PO) Take 1 tablet by mouth at bedtime as needed.      Marland Kitchen lisinopril (PRINIVIL,ZESTRIL) 40 MG tablet Take 1 tablet (40 mg total) by mouth daily.  90 tablet  1  . metoprolol (LOPRESSOR) 50 MG tablet Take 1.5 tablets (75 mg total) by mouth 2 (two) times daily.  90 tablet  5  . Multiple Vitamins-Minerals (CENTRUM SILVER PO) Take by mouth daily.      . pravastatin (PRAVACHOL) 80 MG tablet Take 1 tablet (80 mg total) by mouth daily.  90 tablet  1  . triamterene-hydrochlorothiazide (MAXZIDE-25) 37.5-25 MG per tablet 1/2 tablet every  other day.  90 tablet  4   No current facility-administered medications on file prior to visit.       Review of Systems    see HPI Objective:   Physical Exam Physical Exam  Nursing note and vitals reviewed.  Constitutional: She is oriented to person, place, and time. She appears well-developed and well-nourished.  HENT:  Head: Normocephalic and atraumatic.  Cardiovascular: Normal rate and regular rhythm. Exam reveals no gallop and no friction rub.  No murmur heard.  Pulmonary/Chest: Breath sounds normal. She has no wheezes. She has no rales.  Neurological: She is alert and oriented to person, place, and time.  Skin: Skin is warm and dry.  Psychiatric: She has a normal mood and affect. Her behavior is normal.             Assessment & Plan:  UTI  Repeat U/A today no blood  Insomnia  OK to try benadryl 50 mg nightly  I discusssed Tdap with pt today but she declines  See me as needed

## 2013-02-18 NOTE — Patient Instructions (Addendum)
Use benadryl 1-2 capsules at night for sleep

## 2013-02-19 ENCOUNTER — Ambulatory Visit (INDEPENDENT_AMBULATORY_CARE_PROVIDER_SITE_OTHER): Payer: Medicare Other | Admitting: Internal Medicine

## 2013-02-19 ENCOUNTER — Encounter: Payer: Self-pay | Admitting: Internal Medicine

## 2013-02-19 VITALS — BP 158/78 | HR 62 | Ht 64.0 in | Wt 134.0 lb

## 2013-02-19 DIAGNOSIS — Z950 Presence of cardiac pacemaker: Secondary | ICD-10-CM

## 2013-02-19 DIAGNOSIS — I2510 Atherosclerotic heart disease of native coronary artery without angina pectoris: Secondary | ICD-10-CM

## 2013-02-19 DIAGNOSIS — I4891 Unspecified atrial fibrillation: Secondary | ICD-10-CM

## 2013-02-19 DIAGNOSIS — I498 Other specified cardiac arrhythmias: Secondary | ICD-10-CM

## 2013-02-19 DIAGNOSIS — R002 Palpitations: Secondary | ICD-10-CM

## 2013-02-19 DIAGNOSIS — I471 Supraventricular tachycardia: Secondary | ICD-10-CM

## 2013-02-19 LAB — PACEMAKER DEVICE OBSERVATION
AL AMPLITUDE: 1.5 mv
AL IMPEDENCE PM: 504 Ohm
ATRIAL PACING PM: 85.78
RV LEAD IMPEDENCE PM: 448 Ohm
VENTRICULAR PACING PM: 9.71

## 2013-02-19 MED ORDER — METOPROLOL TARTRATE 100 MG PO TABS: 100.0000 mg | ORAL_TABLET | Freq: Two times a day (BID) | ORAL | Status: DC

## 2013-02-19 NOTE — Assessment & Plan Note (Signed)
As above.

## 2013-02-19 NOTE — Assessment & Plan Note (Signed)
The patient's device was interrogated and the information was fully reviewed.  The device was reprogrammed as below

## 2013-02-19 NOTE — Progress Notes (Signed)
Patient Care Team: Kendrick Ranch, MD as PCP - General (Internal Medicine) Aura Dials, MD (Family Medicine)   HPI  Donna Ortega is a 79 y.o. 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  Has history of atrial fibrillation dating back 5 years for which amiod was tried along the way but was not tolerated    She is s/p CABG, has normal LV function  Because of an abnormal CHADS-VASc score she was started on Pradaxa subsequently stopped2/2 GI bleed  Her breathing has been stable.  She is having difficulty sleeping  Also complaining of palpitations for which her betablocker was increased   Past Medical History  Diagnosis Date  . Coronary artery disease 2007    3 vessel CABG with LIMA-LAD, SVG to diag, and SVG - Ramus  . Hyperlipidemia   . Hypertension   . History of atrial fibrillation; review of all ECG are most consistent with atrial tachycardia     had rash with amiodarone; no anticoagulation due to GI bleed in December 2012  . History of hyperkalemia   . Hyponatremia   . Diverticulitis   . Palpitations   . History of GI bleed Dec 2012    Past Surgical History  Procedure Laterality Date  . Maze    . Coronary artery bypass graft  Feb 2007    Dr. Laneta Simmers;   Marland Kitchen Partial hysterectomy    . Esophagogastroduodenoscopy  08/04/2011    Procedure: ESOPHAGOGASTRODUODENOSCOPY (EGD);  Surgeon: Florencia Reasons, MD;  Location: [facility] ENDOSCOPY;  Service: Endoscopy;  Laterality: N/A;  do at bedside  . Abdominal hysterectomy    . Pace maker      Current Outpatient Prescriptions  Medication Sig Dispense Refill  . aspirin 81 MG tablet Take 81 mg by mouth daily.      . Calcium Carbonate-Vitamin D (CALTRATE 600+D PO) Take 1 tablet by mouth daily.       Marland Kitchen diltiazem (CARDIZEM CD) 240 MG 24 hr capsule Take 1 capsule (240 mg total) by mouth daily.  90 capsule  3  . Ibuprofen-Diphenhydramine Cit (ADVIL PM PO) Take 1 tablet by mouth at bedtime  as needed.      Marland Kitchen lisinopril (PRINIVIL,ZESTRIL) 40 MG tablet Take 1 tablet (40 mg total) by mouth daily.  90 tablet  1  . Multiple Vitamins-Minerals (CENTRUM SILVER PO) Take by mouth daily.      . pravastatin (PRAVACHOL) 80 MG tablet Take 1 tablet (80 mg total) by mouth daily.  90 tablet  1  . triamterene-hydrochlorothiazide (MAXZIDE-25) 37.5-25 MG per tablet 1/2 tablet every other day.  90 tablet  4   No current facility-administered medications for this visit.    Allergies  Allergen Reactions  . Amiodarone Hives    ALL OVER BODY HIVES/ ITCH  . Aspirin     Gi bleed  . Lipitor (Atorvastatin Calcium)     MUSCLE ACHES  . Niaspan (Niacin Er) Rash    Review of Systems negative except from HPI and PMH  Physical Exam BP 158/78  Pulse 62  Ht 5\' 4"  (1.626 m)  Wt 134 lb (60.782 kg)  BMI 22.99 kg/m2  Well developed and well nourished in no acute distress HENT normal E scleral and icterus clear Neck Supple JVP flat; carotids brisk and full Clear to ausculation  Regular rate and rhythm, no murmurs gallops or rub Soft with active bowel sounds No clubbing cyanosis none Edema Alert and oriented, grossly normal motor  and sensory function Skin Warm and Dry    Assessment and  Plan

## 2013-02-19 NOTE — Assessment & Plan Note (Signed)
Stable

## 2013-02-19 NOTE — Patient Instructions (Addendum)
You have [facility] transmission scheduled for May 27, 2013.  Your physician wants you to follow-up in:  12 months with Dr. Graciela Husbands. You will receive a reminder letter in the mail two months in advance. If you don't receive a letter, please call our office to schedule the follow-up appointment.

## 2013-02-19 NOTE — Assessment & Plan Note (Signed)
No intercurrent atrial fibrillation. It is described to me that I stop her anticoagulation because of GI bleeding. I do not see a record of this. In the however, I also do not see any atrial fibrillation on the device. In the event that this is seen, I would recommend reconsideration anticoagulation

## 2013-02-19 NOTE — Assessment & Plan Note (Signed)
Not sure of the cause One possibility is the tracking of her atrial tachycardia with ventricular pacing. We will decrease the tracking rate from 1:30--100. The opportunity to reprogram her device EIR challenging because her AV delay is about 350 ms There are also a series of beats on her episodes which have not clearly understanding. There are atrial sensed events followed 350 ms later by atrial refractory events. These were then followed further by atrial paced events which failed to capture. We will review this with Medtronic

## 2013-04-14 ENCOUNTER — Other Ambulatory Visit: Payer: Self-pay | Admitting: Cardiovascular Disease

## 2013-04-25 ENCOUNTER — Other Ambulatory Visit: Payer: Self-pay | Admitting: Cardiovascular Disease

## 2013-04-29 ENCOUNTER — Telehealth: Payer: Self-pay | Admitting: *Deleted

## 2013-04-29 NOTE — Telephone Encounter (Signed)
Pt states that she had a bone density 2 years ago and is requesting an order. LVM with [facility] requesting a copy of last DEXA

## 2013-04-29 NOTE — Telephone Encounter (Signed)
Donna Ortega called and wants to have a Dexa Scan ordered; she said it has been 2 years since she had one.  She believes her last one was at [facility] in [place].

## 2013-04-30 NOTE — Telephone Encounter (Signed)
See previous notes.

## 2013-05-05 ENCOUNTER — Encounter: Payer: Self-pay | Admitting: Internal Medicine

## 2013-05-05 DIAGNOSIS — M8580 Other specified disorders of bone density and structure, unspecified site: Secondary | ICD-10-CM | POA: Insufficient documentation

## 2013-05-09 ENCOUNTER — Encounter: Payer: Self-pay | Admitting: *Deleted

## 2013-05-27 ENCOUNTER — Ambulatory Visit (INDEPENDENT_AMBULATORY_CARE_PROVIDER_SITE_OTHER): Payer: Medicare Other | Admitting: *Deleted

## 2013-05-27 DIAGNOSIS — I4891 Unspecified atrial fibrillation: Secondary | ICD-10-CM

## 2013-05-27 DIAGNOSIS — Z950 Presence of cardiac pacemaker: Secondary | ICD-10-CM

## 2013-05-27 DIAGNOSIS — I495 Sick sinus syndrome: Secondary | ICD-10-CM

## 2013-05-29 LAB — REMOTE PACEMAKER DEVICE
AL IMPEDENCE PM: 464 Ohm
ATRIAL PACING PM: 82.31
BAMS-0001: 170 {beats}/min
RV LEAD AMPLITUDE: 6.5907 mv

## 2013-06-03 ENCOUNTER — Telehealth: Payer: Self-pay | Admitting: Internal Medicine

## 2013-06-03 DIAGNOSIS — D649 Anemia, unspecified: Secondary | ICD-10-CM

## 2013-06-03 NOTE — Telephone Encounter (Signed)
Donna Ortega call pt and let her know that I received her bone density report  Advise her that she has osteoporosis.  Schedule 30 min OV to see me no hurry    Message back with her response and date of visit   Thanks

## 2013-06-03 NOTE — Telephone Encounter (Signed)
Spoke with Armanda and made appt for 06/18/13 at 9:45 am

## 2013-06-18 ENCOUNTER — Encounter: Payer: Self-pay | Admitting: *Deleted

## 2013-06-18 ENCOUNTER — Encounter: Payer: Self-pay | Admitting: Internal Medicine

## 2013-06-18 ENCOUNTER — Other Ambulatory Visit: Payer: Self-pay | Admitting: Internal Medicine

## 2013-06-18 ENCOUNTER — Ambulatory Visit (INDEPENDENT_AMBULATORY_CARE_PROVIDER_SITE_OTHER): Payer: Medicare Other | Admitting: Internal Medicine

## 2013-06-18 VITALS — BP 130/72 | HR 99 | Temp 98.3°F | Resp 18 | Wt 139.0 lb

## 2013-06-18 DIAGNOSIS — M810 Age-related osteoporosis without current pathological fracture: Secondary | ICD-10-CM

## 2013-06-18 DIAGNOSIS — I2510 Atherosclerotic heart disease of native coronary artery without angina pectoris: Secondary | ICD-10-CM

## 2013-06-18 LAB — COMPREHENSIVE METABOLIC PANEL
ALT: 13 U/L (ref 0–35)
AST: 17 U/L (ref 0–37)
Albumin: 4.1 g/dL (ref 3.5–5.2)
Alkaline Phosphatase: 44 U/L (ref 39–117)
Potassium: 5 mEq/L (ref 3.5–5.3)
Sodium: 135 mEq/L (ref 135–145)
Total Protein: 6.3 g/dL (ref 6.0–8.3)

## 2013-06-18 LAB — CBC WITH DIFFERENTIAL/PLATELET
Basophils Absolute: 0 10*3/uL (ref 0.0–0.1)
Basophils Relative: 0 % (ref 0–1)
Eosinophils Absolute: 0.1 10*3/uL (ref 0.0–0.7)
Hemoglobin: 11.6 g/dL — ABNORMAL LOW (ref 12.0–15.0)
MCH: 31 pg (ref 26.0–34.0)
MCHC: 33.9 g/dL (ref 30.0–36.0)
Neutro Abs: 3.5 10*3/uL (ref 1.7–7.7)
Neutrophils Relative %: 65 % (ref 43–77)
Platelets: 210 10*3/uL (ref 150–400)
RDW: 13.7 % (ref 11.5–15.5)

## 2013-06-18 NOTE — Patient Instructions (Signed)
See me in 3-4 months 

## 2013-06-18 NOTE — Progress Notes (Signed)
Subjective:    Patient ID: Donna Ortega, female    DOB: 1926/05/15, 79 y.o.   MRN: [id number]  HPI [name] is here to discuss her DEXA result  She has improvement in her lumbar spine but osteoporosis level in her Left forearm.  She tells me she was on Fosamax for many years (over 10) and she does not wish to go on meds again.    Allergies  Allergen Reactions  . Amiodarone Hives    ALL OVER BODY HIVES/ ITCH  . Aspirin     Gi bleed  . Lipitor [Atorvastatin Calcium]     MUSCLE ACHES  . Niaspan [Niacin Er] Rash   Past Medical History  Diagnosis Date  . Coronary artery disease 2007    3 vessel CABG with LIMA-LAD, SVG to diag, and SVG - Ramus  . Hyperlipidemia   . Hypertension   . History of atrial fibrillation; review of all ECG are most consistent with atrial tachycardia     had rash with amiodarone; no anticoagulation due to GI bleed in December 2012  . History of hyperkalemia   . Hyponatremia   . Diverticulitis   . Palpitations   . History of GI bleed Dec 2012   Past Surgical History  Procedure Laterality Date  . Maze    . Coronary artery bypass graft  Feb 2007    Dr. Laneta Simmers;   Marland Kitchen Partial hysterectomy    . Esophagogastroduodenoscopy  08/04/2011    Procedure: ESOPHAGOGASTRODUODENOSCOPY (EGD);  Surgeon: Florencia Reasons, MD;  Location: [facility] ENDOSCOPY;  Service: Endoscopy;  Laterality: N/A;  do at bedside  . Abdominal hysterectomy    . Pace maker     History   Social History  . Marital Status: Single    Spouse Name: N/A    Number of Children: N/A  . Years of Education: N/A   Occupational History  . Not on file.   Social History Main Topics  . Smoking status: Never Smoker   . Smokeless tobacco: Not on file  . Alcohol Use: No  . Drug Use: No  . Sexual Activity: No   Other Topics Concern  . Not on file   Social History Narrative   Ambulatory usually independently, plays golf   Lives at home with family   Daughter in law Farhiya Rosten [phone]   Family History   Problem Relation Age of Onset  . Heart failure Father   . Kidney failure Father   . Bone cancer Brother   . Kidney failure Sister   . Colon cancer Sister   . Cancer Sister    Patient Active Problem List   Diagnosis Date Noted  . Osteoporosis 06/18/2013  . Osteopenia 05/05/2013  . Palpitation 02/19/2013  . Insomnia 02/18/2013  . Candida esophagitis 01/22/2013  . Gastric ulcer 01/22/2013  . UTI (urinary tract infection) 01/17/2013  . Atrial tachycardia 05/22/2012  . Pacemaker-Medtronic 02/09/2012  . Sinus node dysfunction 02/06/2012  . Syncope and collapse 02/06/2012  . Rash 09/19/2011  . UTI (lower urinary tract infection) 08/21/2011  . Fatigue 08/21/2011  . Peptic ulcer disease with hemorrhage 08/14/2011  . S/P vertebroplasty 08/14/2011  . Chronic kidney disease (CKD), stage III (moderate) 08/05/2011  . Anemia associated with acute blood loss 08/04/2011  . GI bleed 08/04/2011  . Dyslipidemia 08/04/2011  . Melena 08/03/2011  . CAD (coronary artery disease) s/p CABG 01/11/2011  . Atrial fibrillation/flutter 01/11/2011  . HTN (hypertension) 01/11/2011   Current Outpatient Prescriptions on File Prior  to Visit  Medication Sig Dispense Refill  . aspirin 81 MG tablet Take 81 mg by mouth daily.      . Calcium Carbonate-Vitamin D (CALTRATE 600+D PO) Take 1 tablet by mouth daily.       Marland Kitchen diltiazem (CARDIZEM CD) 240 MG 24 hr capsule Take 1 capsule (240 mg total) by mouth daily.  90 capsule  3  . Ibuprofen-Diphenhydramine Cit (ADVIL PM PO) Take 1 tablet by mouth at bedtime as needed.      Marland Kitchen lisinopril (PRINIVIL,ZESTRIL) 40 MG tablet TAKE 1 TABLET DAILY  90 tablet  0  . metoprolol (LOPRESSOR) 100 MG tablet Take 1 tablet (100 mg total) by mouth 2 (two) times daily.  180 tablet  3  . Multiple Vitamins-Minerals (CENTRUM SILVER PO) Take by mouth daily.      . pravastatin (PRAVACHOL) 80 MG tablet TAKE 1 TABLET DAILY  90 tablet  0  . triamterene-hydrochlorothiazide (MAXZIDE-25) 37.5-25 MG  per tablet 1/2 tablet every other day.  90 tablet  4   No current facility-administered medications on file prior to visit.       Review of Systems    see HPI Objective:   Physical Exam  Physical Exam  Nursing note and vitals reviewed.  Constitutional: She is oriented to person, place, and time. She appears well-developed and well-nourished.  HENT:  Head: Normocephalic and atraumatic.  Cardiovascular: Normal rate and regular rhythm. Exam reveals no gallop and no friction rub.  No murmur heard.  Pulmonary/Chest: Breath sounds normal. She has no wheezes. She has no rales.  Neurological: She is alert and oriented to person, place, and time.  Skin: Skin is warm and dry.  Psychiatric: She has a normal mood and affect. Her behavior is normal.            Assessment & Plan:  osteoprososis  L forearm  -3.4  Discussed risks/benefts of both osteoporosis and it treatment.  She wishes to discuss with her family .  I offered referral to rheumatology but she declines at this point Will readdress at nex tvisit  HTN continue meds  Dyslipidemia  Continue pravastatin    See me in 3-4 months

## 2013-06-20 ENCOUNTER — Telehealth: Payer: Self-pay | Admitting: *Deleted

## 2013-06-20 LAB — FERRITIN: Ferritin: 42 ng/mL (ref 10–291)

## 2013-06-20 LAB — FOLATE: Folate: >20 ng/mL

## 2013-06-20 LAB — VITAMIN B12: Vitamin B-12: 540 pg/mL (ref 211–911)

## 2013-06-20 NOTE — Telephone Encounter (Signed)
Labs added on to previous lab draw

## 2013-06-20 NOTE — Telephone Encounter (Signed)
[facility] labs called and labs added to original lad draw

## 2013-06-25 ENCOUNTER — Encounter: Payer: Self-pay | Admitting: Internal Medicine

## 2013-06-26 DIAGNOSIS — D649 Anemia, unspecified: Secondary | ICD-10-CM | POA: Insufficient documentation

## 2013-06-26 NOTE — Telephone Encounter (Signed)
LVM message to stop asa and to call office

## 2013-06-26 NOTE — Telephone Encounter (Signed)
Spoke with pt and informed of anemia  She has dropped 1.5 gms in 5 month.  She has had ulcer in the past and I advised to come off baby aspirin for now  Will set up referral to GI

## 2013-07-04 ENCOUNTER — Ambulatory Visit: Payer: Medicare Other | Admitting: Internal Medicine

## 2013-07-09 ENCOUNTER — Encounter: Payer: Self-pay | Admitting: Internal Medicine

## 2013-07-09 ENCOUNTER — Ambulatory Visit (INDEPENDENT_AMBULATORY_CARE_PROVIDER_SITE_OTHER): Payer: Medicare Other | Admitting: Internal Medicine

## 2013-07-09 VITALS — BP 112/73 | HR 90 | Temp 98.5°F | Resp 18 | Wt 140.0 lb

## 2013-07-09 DIAGNOSIS — D649 Anemia, unspecified: Secondary | ICD-10-CM

## 2013-07-09 DIAGNOSIS — R399 Unspecified symptoms and signs involving the genitourinary system: Secondary | ICD-10-CM

## 2013-07-09 DIAGNOSIS — R3915 Urgency of urination: Secondary | ICD-10-CM

## 2013-07-09 DIAGNOSIS — R3989 Other symptoms and signs involving the genitourinary system: Secondary | ICD-10-CM

## 2013-07-09 LAB — POCT URINALYSIS DIPSTICK
Blood, UA: NEGATIVE
Nitrite, UA: NEGATIVE
Protein, UA: NEGATIVE
Spec Grav, UA: 1.02
Urobilinogen, UA: NEGATIVE

## 2013-07-09 NOTE — Progress Notes (Signed)
Subjective:    Patient ID: Donna Ortega, female    DOB: 1925/08/19, 79 y.o.   MRN: [id number]  HPI  Donna Ortega is here for follow up.  She had seen Dr. Matthias Hughs who did not feel anemia coming from GI source.  She has been anemic off and on in the past.    She will be spending Thanksgiving with a neighbor who is going to cook dinner for her.   She does have some urinary urgency but she denies any incontinence.  She is wondering if she has a UTI   Allergies  Allergen Reactions  . Amiodarone Hives    ALL OVER BODY HIVES/ ITCH  . Aspirin     Gi bleed  . Lipitor [Atorvastatin Calcium]     MUSCLE ACHES  . Niaspan [Niacin Er] Rash   Past Medical History  Diagnosis Date  . Coronary artery disease 2007    3 vessel CABG with LIMA-LAD, SVG to diag, and SVG - Ramus  . Hyperlipidemia   . Hypertension   . History of atrial fibrillation; review of all ECG are most consistent with atrial tachycardia     had rash with amiodarone; no anticoagulation due to GI bleed in December 2012  . History of hyperkalemia   . Hyponatremia   . Diverticulitis   . Palpitations   . History of GI bleed Dec 2012   Past Surgical History  Procedure Laterality Date  . Maze    . Coronary artery bypass graft  Feb 2007    Dr. Laneta Simmers;   Marland Kitchen Partial hysterectomy    . Esophagogastroduodenoscopy  08/04/2011    Procedure: ESOPHAGOGASTRODUODENOSCOPY (EGD);  Surgeon: Florencia Reasons, MD;  Location: [facility] ENDOSCOPY;  Service: Endoscopy;  Laterality: N/A;  do at bedside  . Abdominal hysterectomy    . Pace maker     History   Social History  . Marital Status: Single    Spouse Name: N/A    Number of Children: N/A  . Years of Education: N/A   Occupational History  . Not on file.   Social History Main Topics  . Smoking status: Never Smoker   . Smokeless tobacco: Not on file  . Alcohol Use: No  . Drug Use: No  . Sexual Activity: No   Other Topics Concern  . Not on file   Social History Narrative   Ambulatory usually  independently, plays golf   Lives at home with family   Daughter in law Donna Ortega [phone]   Family History  Problem Relation Age of Onset  . Heart failure Father   . Kidney failure Father   . Bone cancer Brother   . Kidney failure Sister   . Colon cancer Sister   . Cancer Sister    Patient Active Problem List   Diagnosis Date Noted  . Anemia 06/26/2013  . Osteoporosis 06/18/2013  . Osteopenia 05/05/2013  . Palpitation 02/19/2013  . Insomnia 02/18/2013  . Candida esophagitis 01/22/2013  . Gastric ulcer 01/22/2013  . UTI (urinary tract infection) 01/17/2013  . Atrial tachycardia 05/22/2012  . Pacemaker-Medtronic 02/09/2012  . Sinus node dysfunction 02/06/2012  . Syncope and collapse 02/06/2012  . Rash 09/19/2011  . UTI (lower urinary tract infection) 08/21/2011  . Fatigue 08/21/2011  . Peptic ulcer disease with hemorrhage 08/14/2011  . S/P vertebroplasty 08/14/2011  . Chronic kidney disease (CKD), stage III (moderate) 08/05/2011  . Anemia associated with acute blood loss 08/04/2011  . GI bleed 08/04/2011  . Dyslipidemia 08/04/2011  .  Melena 08/03/2011  . CAD (coronary artery disease) s/p CABG 01/11/2011  . Atrial fibrillation/flutter 01/11/2011  . HTN (hypertension) 01/11/2011   Current Outpatient Prescriptions on File Prior to Visit  Medication Sig Dispense Refill  . aspirin 81 MG tablet Take 81 mg by mouth daily.      . Calcium Carbonate-Vitamin D (CALTRATE 600+D PO) Take 1 tablet by mouth daily.       Marland Kitchen diltiazem (CARDIZEM CD) 240 MG 24 hr capsule Take 1 capsule (240 mg total) by mouth daily.  90 capsule  3  . Ibuprofen-Diphenhydramine Cit (ADVIL PM PO) Take 1 tablet by mouth at bedtime as needed.      Marland Kitchen lisinopril (PRINIVIL,ZESTRIL) 40 MG tablet TAKE 1 TABLET DAILY  90 tablet  0  . metoprolol (LOPRESSOR) 100 MG tablet Take 1 tablet (100 mg total) by mouth 2 (two) times daily.  180 tablet  3  . Multiple Vitamins-Minerals (CENTRUM SILVER PO) Take by mouth daily.       . pravastatin (PRAVACHOL) 80 MG tablet TAKE 1 TABLET DAILY  90 tablet  0  . triamterene-hydrochlorothiazide (MAXZIDE-25) 37.5-25 MG per tablet 1/2 tablet every other day.  90 tablet  4  . UNKNOWN TO PATIENT Take 1 tablet by mouth daily. "little pink allergy pill"       No current facility-administered medications on file prior to visit.      Review of Systems See HPI    Objective:   Physical Exam  Physical Exam  Nursing note and vitals reviewed.  Constitutional: She is oriented to person, place, and time. She appears well-developed and well-nourished.  HENT:  Head: Normocephalic and atraumatic.  Cardiovascular: Normal rate and regular rhythm. Exam reveals no gallop and no friction rub.  No murmur heard.  Pulmonary/Chest: Breath sounds normal. She has no wheezes. She has no rales.  Neurological: She is alert and oriented to person, place, and time.  Skin: Skin is warm and dry.  Psychiatric: She has a normal mood and affect. Her behavior is normal.       Assessment & Plan:  Anemia  Will empirically try FE supplement daily or qod  See me in 3 months for recheck of CBC  Urinary urgency  U/A today  Shows small LE will send for culture.

## 2013-07-09 NOTE — Patient Instructions (Signed)
See me in 3 months  30 mins  Take iron tablet every day  I will send by computer to [facility]  Have a nice thanksgiving

## 2013-07-15 ENCOUNTER — Encounter: Payer: Self-pay | Admitting: Cardiovascular Disease

## 2013-07-15 ENCOUNTER — Ambulatory Visit (INDEPENDENT_AMBULATORY_CARE_PROVIDER_SITE_OTHER): Payer: Medicare Other | Admitting: Cardiovascular Disease

## 2013-07-15 VITALS — BP 120/68 | HR 86 | Ht 64.0 in | Wt 139.0 lb

## 2013-07-15 DIAGNOSIS — I4891 Unspecified atrial fibrillation: Secondary | ICD-10-CM

## 2013-07-15 DIAGNOSIS — I2510 Atherosclerotic heart disease of native coronary artery without angina pectoris: Secondary | ICD-10-CM

## 2013-07-15 DIAGNOSIS — E785 Hyperlipidemia, unspecified: Secondary | ICD-10-CM

## 2013-07-15 DIAGNOSIS — H9192 Unspecified hearing loss, left ear: Secondary | ICD-10-CM

## 2013-07-15 DIAGNOSIS — I10 Essential (primary) hypertension: Secondary | ICD-10-CM

## 2013-07-15 HISTORY — DX: Unspecified hearing loss, left ear: H91.92

## 2013-07-15 LAB — LIPID PANEL
Total CHOL/HDL Ratio: 2
VLDL: 15.2 mg/dL (ref 0.0–40.0)

## 2013-07-15 MED ORDER — LISINOPRIL 40 MG PO TABS: ORAL_TABLET | ORAL | Status: DC

## 2013-07-15 MED ORDER — PRAVASTATIN SODIUM 80 MG PO TABS: ORAL_TABLET | ORAL | Status: DC

## 2013-07-15 NOTE — Addendum Note (Signed)
Addended by: Mathews Robinsons on: 07/15/2013 01:03 PM   Modules accepted: Orders

## 2013-07-15 NOTE — Patient Instructions (Addendum)
Your physician wants you to follow-up in: 1 YEAR  You will receive a reminder letter in the mail two months in advance. If you don't receive a letter, please call our office to schedule the follow-up appointment.   Your physician recommends that you continue on your current medications as directed. Please refer to the Current Medication list given to you today.   Your physician recommends that you return for lab work in: TODAY/ LIPIDS

## 2013-07-15 NOTE — Assessment & Plan Note (Signed)
Donna Ortega is doing well. She has not had any episodes of chest pain or shortness of breath. We'll check fasting lipids today. She's had her other labs drawn by her medical doctor.  She is now living independently in her apartment. She overall seems to be doing quite well. See her again in one year for an office visit and fasting labs.

## 2013-07-15 NOTE — Assessment & Plan Note (Signed)
bp is stable  

## 2013-07-15 NOTE — Progress Notes (Signed)
Donna Ortega Date of Birth  [birth year] [facility]     [place] [facility]  [address]    [address]   [address] [place], [place]  [address]    [place], [place]  [address] [phone]  Fax  [phone]  [phone]  Fax [phone]  Problems:  1. CAD - s/p CABG 2007, 2. PACs 3. Drug Rash - amiodarone 4. GI Bleed 5. Anemia 6. hyponatremia   History of Present Illness:  Donna Ortega is an 79 year old female with a history of coronary artery disease and atrial fibrillation. She was admitted to the hospital in December with a GI bleed, anemia, and rapid atrial fibrillation. She was discharged with the additional medications of amiodarone and Cardizem CD.  She was seen in mid January and was doing fairly well.  She stopped her Amiodarone due to drug rash.  She still has generalized fatigue and generalized weakness. She wonders if it may be due to the Lasix and potassium.  April 17, 2012 She has been more fatigued recently.  She played golf this past weekend and still is not over the exertion.  Dec. 3, 2013 :  She is doing well.  Dr. Graciela Husbands stopped her Pradaxa due to GI bleed.    She is doing very well.   January 15, 2013:  Donna Ortega is doing OK.  She does not have as much energy as she would like.   She has continued to have problems with hyponatremia.  She is on maxzide which is likely exacerbating this.  No CP, no dyspnea.    Dec. 1, 2014:  Donna Ortega is doing well.  No CP or dyspnea.  Rhythm is stable.   Her Hb was a bit low when last checked by her medical doctor.  Has not started on the iron tabs.   No CP, not playing golf any more because of back pain ( s/p kyphoplasty)  .  She is still walking around the block on occasion.   She is now in her own apartment.   Current Outpatient Prescriptions on File Prior to Visit  Medication Sig Dispense Refill  . aspirin 81 MG tablet Take 81 mg by mouth every other day.       . Calcium Carbonate-Vitamin D (CALTRATE 600+D PO) Take 1  tablet by mouth daily.       Marland Kitchen diltiazem (CARDIZEM CD) 240 MG 24 hr capsule Take 1 capsule (240 mg total) by mouth daily.  90 capsule  3  . Ibuprofen-Diphenhydramine Cit (ADVIL PM PO) Take 1 tablet by mouth at bedtime as needed.      Marland Kitchen lisinopril (PRINIVIL,ZESTRIL) 40 MG tablet TAKE 1 TABLET DAILY  90 tablet  0  . metoprolol (LOPRESSOR) 100 MG tablet Take 1 tablet (100 mg total) by mouth 2 (two) times daily.  180 tablet  3  . Multiple Vitamins-Minerals (CENTRUM SILVER PO) Take by mouth daily.      . pravastatin (PRAVACHOL) 80 MG tablet TAKE 1 TABLET DAILY  90 tablet  0  . triamterene-hydrochlorothiazide (MAXZIDE-25) 37.5-25 MG per tablet 1/2 tablet every other day.  90 tablet  4  . UNKNOWN TO PATIENT Take 1 tablet by mouth daily. "little pink allergy pill"       No current facility-administered medications on file prior to visit.    Allergies  Allergen Reactions  . Amiodarone Hives    ALL OVER BODY HIVES/ ITCH  . Aspirin     Gi bleed  . Lipitor [Atorvastatin Calcium]  MUSCLE ACHES  . Niaspan [Niacin Er] Rash    Past Medical History  Diagnosis Date  . Coronary artery disease 2007    3 vessel CABG with LIMA-LAD, SVG to diag, and SVG - Ramus  . Hyperlipidemia   . Hypertension   . History of atrial fibrillation; review of all ECG are most consistent with atrial tachycardia     had rash with amiodarone; no anticoagulation due to GI bleed in December 2012  . History of hyperkalemia   . Hyponatremia   . Diverticulitis   . Palpitations   . History of GI bleed Dec 2012    Past Surgical History  Procedure Laterality Date  . Maze    . Coronary artery bypass graft  Feb 2007    Dr. Laneta Simmers;   Marland Kitchen Partial hysterectomy    . Esophagogastroduodenoscopy  08/04/2011    Procedure: ESOPHAGOGASTRODUODENOSCOPY (EGD);  Surgeon: Florencia Reasons, MD;  Location: [facility] ENDOSCOPY;  Service: Endoscopy;  Laterality: N/A;  do at bedside  . Abdominal hysterectomy    . Pace maker      History   Smoking status  . Never Smoker   Smokeless tobacco  . Not on file    History  Alcohol Use No    Family History  Problem Relation Age of Onset  . Heart failure Father   . Kidney failure Father   . Bone cancer Brother   . Kidney failure Sister   . Colon cancer Sister   . Cancer Sister     Reviw of Systems:  Reviewed in the HPI.  All other systems are negative.  Physical Exam: Blood pressure 120/68, pulse 86, height 5\' 4"  (1.626 m), weight 139 lb (63.05 kg). General: Well developed, well nourished, in no acute distress.  Head: Normocephalic, atraumatic, sclera non-icteric, mucus membranes are moist,   Neck: Supple. Negative for carotid bruits. JVD not elevated.  Lungs: Clear bilaterally to auscultation without wheezes, rales, or rhonchi. Breathing is unlabored.  Heart: RR with occasional premature beats  with normal S1 S2.    Abdomen: Soft, non-tender, non-distended with normoactive bowel sounds. No hepatomegaly. No rebound/guarding. No obvious abdominal masses.  Msk:  Strength and tone appear normal for age.  Extremities: No clubbing or cyanosis. No edema.  Distal pedal pulses are 2+ and equal bilaterally.  Neuro: Alert and oriented X 3. Moves all extremities spontaneously.  Psych:  Responds to questions appropriately with a normal affect.   ECG: Dec. 1, 2014:  NSR at 1st degree AVB.  NS ST/ T abn.   Assessment / Plan:

## 2013-07-18 ENCOUNTER — Telehealth: Payer: Self-pay | Admitting: *Deleted

## 2013-07-18 LAB — URINE CULTURE: Colony Count: 7000

## 2013-07-18 NOTE — Telephone Encounter (Signed)
Notified pt of culture results 

## 2013-07-18 NOTE — Telephone Encounter (Signed)
Message copied by Mathews Robinsons on Thu Jul 18, 2013  4:44 PM ------      Message from: Raechel Chute D      Created: Thu Jul 18, 2013  4:24 PM       Karen Kitchens            Call pt and let her know that her urine culture did not show infection.  She can see me in office if not better ------

## 2013-08-29 ENCOUNTER — Other Ambulatory Visit: Payer: Self-pay | Admitting: Internal Medicine

## 2013-08-29 ENCOUNTER — Encounter: Payer: Medicare HMO | Admitting: *Deleted

## 2013-08-29 DIAGNOSIS — I495 Sick sinus syndrome: Secondary | ICD-10-CM

## 2013-09-03 LAB — MDC_IDC_ENUM_SESS_TYPE_REMOTE
Brady Statistic AP VP Percent: 0.99 %
Brady Statistic AP VS Percent: 40.05 %
Brady Statistic AS VS Percent: 48.05 %
Brady Statistic RV Percent Paced: 11.9 %
Date Time Interrogation Session: 20150115155709
Lead Channel Impedance Value: 424 Ohm
Lead Channel Sensing Intrinsic Amplitude: 1.9 mV
Lead Channel Setting Pacing Amplitude: 2.5 V
Lead Channel Setting Pacing Pulse Width: 0.4 ms
Lead Channel Setting Sensing Sensitivity: 0.9 mV
MDC IDC MSMT BATTERY VOLTAGE: 3 V
MDC IDC MSMT LEADCHNL RA IMPEDANCE VALUE: 456 Ohm
MDC IDC SET LEADCHNL RA PACING AMPLITUDE: 2 V
MDC IDC STAT BRADY AS VP PERCENT: 10.91 %
MDC IDC STAT BRADY RA PERCENT PACED: 41.05 %
Zone Setting Detection Interval: 350 ms
Zone Setting Detection Interval: 400 ms

## 2013-09-09 DIAGNOSIS — K922 Gastrointestinal hemorrhage, unspecified: Secondary | ICD-10-CM

## 2013-09-09 HISTORY — DX: Gastrointestinal hemorrhage, unspecified: K92.2

## 2013-09-10 ENCOUNTER — Other Ambulatory Visit: Payer: Self-pay | Admitting: *Deleted

## 2013-09-10 ENCOUNTER — Encounter: Payer: Self-pay | Admitting: Internal Medicine

## 2013-09-10 ENCOUNTER — Inpatient Hospital Stay (HOSPITAL_COMMUNITY): Payer: Medicare HMO

## 2013-09-10 ENCOUNTER — Encounter (HOSPITAL_BASED_OUTPATIENT_CLINIC_OR_DEPARTMENT_OTHER): Payer: Self-pay | Admitting: Emergency Medicine

## 2013-09-10 ENCOUNTER — Inpatient Hospital Stay (HOSPITAL_BASED_OUTPATIENT_CLINIC_OR_DEPARTMENT_OTHER)
Admission: EM | Admit: 2013-09-10 | Discharge: 2013-09-12 | DRG: 378 | Disposition: A | Discharge status: Home / Self Care | Payer: Medicare HMO | Attending: Family Medicine | Admitting: Family Medicine

## 2013-09-10 ENCOUNTER — Ambulatory Visit (INDEPENDENT_AMBULATORY_CARE_PROVIDER_SITE_OTHER): Payer: Managed Care, Other (non HMO) | Admitting: Internal Medicine

## 2013-09-10 VITALS — BP 126/73 | HR 67 | Temp 97.8°F | Ht 62.0 in | Wt 141.0 lb

## 2013-09-10 DIAGNOSIS — I4891 Unspecified atrial fibrillation: Secondary | ICD-10-CM

## 2013-09-10 DIAGNOSIS — E785 Hyperlipidemia, unspecified: Secondary | ICD-10-CM

## 2013-09-10 DIAGNOSIS — I471 Supraventricular tachycardia: Secondary | ICD-10-CM

## 2013-09-10 DIAGNOSIS — K2960 Other gastritis without bleeding: Secondary | ICD-10-CM | POA: Diagnosis present

## 2013-09-10 DIAGNOSIS — Z79899 Other long term (current) drug therapy: Secondary | ICD-10-CM

## 2013-09-10 DIAGNOSIS — R002 Palpitations: Secondary | ICD-10-CM

## 2013-09-10 DIAGNOSIS — Z808 Family history of malignant neoplasm of other organs or systems: Secondary | ICD-10-CM

## 2013-09-10 DIAGNOSIS — Z950 Presence of cardiac pacemaker: Secondary | ICD-10-CM

## 2013-09-10 DIAGNOSIS — R1013 Epigastric pain: Secondary | ICD-10-CM

## 2013-09-10 DIAGNOSIS — B3781 Candidal esophagitis: Secondary | ICD-10-CM

## 2013-09-10 DIAGNOSIS — Z888 Allergy status to other drugs, medicaments and biological substances status: Secondary | ICD-10-CM

## 2013-09-10 DIAGNOSIS — I129 Hypertensive chronic kidney disease with stage 1 through stage 4 chronic kidney disease, or unspecified chronic kidney disease: Secondary | ICD-10-CM | POA: Diagnosis present

## 2013-09-10 DIAGNOSIS — I4719 Other supraventricular tachycardia: Secondary | ICD-10-CM

## 2013-09-10 DIAGNOSIS — N183 Chronic kidney disease, stage 3 unspecified: Secondary | ICD-10-CM

## 2013-09-10 DIAGNOSIS — K921 Melena: Secondary | ICD-10-CM

## 2013-09-10 DIAGNOSIS — M8580 Other specified disorders of bone density and structure, unspecified site: Secondary | ICD-10-CM

## 2013-09-10 DIAGNOSIS — E871 Hypo-osmolality and hyponatremia: Secondary | ICD-10-CM | POA: Diagnosis present

## 2013-09-10 DIAGNOSIS — R21 Rash and other nonspecific skin eruption: Secondary | ICD-10-CM

## 2013-09-10 DIAGNOSIS — Z800 Family history of malignant neoplasm of digestive organs: Secondary | ICD-10-CM

## 2013-09-10 DIAGNOSIS — Z8249 Family history of ischemic heart disease and other diseases of the circulatory system: Secondary | ICD-10-CM

## 2013-09-10 DIAGNOSIS — K625 Hemorrhage of anus and rectum: Secondary | ICD-10-CM

## 2013-09-10 DIAGNOSIS — Z7982 Long term (current) use of aspirin: Secondary | ICD-10-CM

## 2013-09-10 DIAGNOSIS — I2510 Atherosclerotic heart disease of native coronary artery without angina pectoris: Secondary | ICD-10-CM

## 2013-09-10 DIAGNOSIS — N390 Urinary tract infection, site not specified: Secondary | ICD-10-CM

## 2013-09-10 DIAGNOSIS — M810 Age-related osteoporosis without current pathological fracture: Secondary | ICD-10-CM

## 2013-09-10 DIAGNOSIS — R55 Syncope and collapse: Secondary | ICD-10-CM

## 2013-09-10 DIAGNOSIS — I482 Chronic atrial fibrillation, unspecified: Secondary | ICD-10-CM | POA: Diagnosis present

## 2013-09-10 DIAGNOSIS — K922 Gastrointestinal hemorrhage, unspecified: Secondary | ICD-10-CM

## 2013-09-10 DIAGNOSIS — K449 Diaphragmatic hernia without obstruction or gangrene: Secondary | ICD-10-CM | POA: Diagnosis present

## 2013-09-10 DIAGNOSIS — G4700 Insomnia, unspecified: Secondary | ICD-10-CM

## 2013-09-10 DIAGNOSIS — I10 Essential (primary) hypertension: Secondary | ICD-10-CM

## 2013-09-10 DIAGNOSIS — I495 Sick sinus syndrome: Secondary | ICD-10-CM

## 2013-09-10 DIAGNOSIS — I4892 Unspecified atrial flutter: Secondary | ICD-10-CM | POA: Diagnosis present

## 2013-09-10 DIAGNOSIS — Z951 Presence of aortocoronary bypass graft: Secondary | ICD-10-CM

## 2013-09-10 DIAGNOSIS — D62 Acute posthemorrhagic anemia: Secondary | ICD-10-CM

## 2013-09-10 DIAGNOSIS — R5383 Other fatigue: Secondary | ICD-10-CM

## 2013-09-10 DIAGNOSIS — K5731 Diverticulosis of large intestine without perforation or abscess with bleeding: Principal | ICD-10-CM | POA: Diagnosis present

## 2013-09-10 DIAGNOSIS — H9190 Unspecified hearing loss, unspecified ear: Secondary | ICD-10-CM | POA: Diagnosis present

## 2013-09-10 DIAGNOSIS — K259 Gastric ulcer, unspecified as acute or chronic, without hemorrhage or perforation: Secondary | ICD-10-CM

## 2013-09-10 DIAGNOSIS — Z9889 Other specified postprocedural states: Secondary | ICD-10-CM

## 2013-09-10 DIAGNOSIS — D649 Anemia, unspecified: Secondary | ICD-10-CM

## 2013-09-10 HISTORY — DX: Gastrointestinal hemorrhage, unspecified: K92.2

## 2013-09-10 HISTORY — DX: Unspecified osteoarthritis, unspecified site: M19.90

## 2013-09-10 LAB — CBC WITH DIFFERENTIAL/PLATELET
BASOS ABS: 0 10*3/uL (ref 0.0–0.1)
BASOS PCT: 0 % (ref 0–1)
Basophils Absolute: 0 10*3/uL (ref 0.0–0.1)
Basophils Relative: 0 % (ref 0–1)
EOS ABS: 0.1 10*3/uL (ref 0.0–0.7)
EOS PCT: 2 % (ref 0–5)
Eosinophils Absolute: 0.2 10*3/uL (ref 0.0–0.7)
Eosinophils Relative: 2 % (ref 0–5)
HCT: 32.9 % — ABNORMAL LOW (ref 36.0–46.0)
HEMATOCRIT: 33 % — AB (ref 36.0–46.0)
Hemoglobin: 11.1 g/dL — ABNORMAL LOW (ref 12.0–15.0)
Hemoglobin: 11.3 g/dL — ABNORMAL LOW (ref 12.0–15.0)
LYMPHS ABS: 2.3 10*3/uL (ref 0.7–4.0)
LYMPHS PCT: 32 % (ref 12–46)
Lymphocytes Relative: 19 % (ref 12–46)
Lymphs Abs: 1.2 10*3/uL (ref 0.7–4.0)
MCH: 32 pg (ref 26.0–34.0)
MCH: 31.7 pg (ref 26.0–34.0)
MCHC: 33.6 g/dL (ref 30.0–36.0)
MCHC: 34.3 g/dL (ref 30.0–36.0)
MCV: 95.1 fL (ref 78.0–100.0)
MCV: 92.2 fL (ref 78.0–100.0)
MONO ABS: 0.6 10*3/uL (ref 0.1–1.0)
Monocytes Absolute: 0.7 10*3/uL (ref 0.1–1.0)
Monocytes Relative: 9 % (ref 3–12)
Monocytes Relative: 9 % (ref 3–12)
NEUTROS ABS: 4.5 10*3/uL (ref 1.7–7.7)
NEUTROS ABS: 4.1 10*3/uL (ref 1.7–7.7)
NEUTROS PCT: 70 % (ref 43–77)
NEUTROS PCT: 57 % (ref 43–77)
PLATELETS: 228 10*3/uL (ref 150–400)
Platelets: 225 10*3/uL (ref 150–400)
RBC: 3.47 MIL/uL — ABNORMAL LOW (ref 3.87–5.11)
RBC: 3.57 MIL/uL — AB (ref 3.87–5.11)
RDW: 12 % (ref 11.5–15.5)
RDW: 12.5 % (ref 11.5–15.5)
WBC: 6.5 10*3/uL (ref 4.0–10.5)
WBC: 7.2 10*3/uL (ref 4.0–10.5)

## 2013-09-10 LAB — COMPREHENSIVE METABOLIC PANEL
ALBUMIN: 3.9 g/dL (ref 3.5–5.2)
ALT: 14 U/L (ref 0–35)
AST: 18 U/L (ref 0–37)
Alkaline Phosphatase: 51 U/L (ref 39–117)
BILIRUBIN TOTAL: 0.3 mg/dL (ref 0.3–1.2)
BUN: 31 mg/dL — AB (ref 6–23)
CALCIUM: 9.5 mg/dL (ref 8.4–10.5)
CHLORIDE: 93 meq/L — AB (ref 96–112)
CO2: 26 mEq/L (ref 19–32)
CREATININE: 0.9 mg/dL (ref 0.50–1.10)
GFR calc Af Amer: 66 mL/min — ABNORMAL LOW (ref >90)
GFR calc non Af Amer: 57 mL/min — ABNORMAL LOW (ref >90)
Glucose, Bld: 97 mg/dL (ref 70–99)
Potassium: 5.3 mEq/L (ref 3.7–5.3)
Sodium: 133 mEq/L — ABNORMAL LOW (ref 137–147)
TOTAL PROTEIN: 7 g/dL (ref 6.0–8.3)

## 2013-09-10 LAB — PROTIME-INR
INR: 0.99 (ref 0.00–1.49)
PROTHROMBIN TIME: 12.9 s (ref 11.6–15.2)

## 2013-09-10 LAB — OCCULT BLOOD X 1 CARD TO LAB, STOOL: Fecal Occult Bld: POSITIVE — AB

## 2013-09-10 LAB — MRSA PCR SCREENING: MRSA BY PCR: NEGATIVE

## 2013-09-10 MED ORDER — ONDANSETRON HCL 4 MG PO TABS: 4.0000 mg | ORAL_TABLET | Freq: Four times a day (QID) | ORAL | Status: DC | PRN

## 2013-09-10 MED ORDER — DIPHENHYDRAMINE HCL 25 MG PO TABS
25.0000 mg | ORAL_TABLET | Freq: Every evening | ORAL | Status: DC | PRN
  Filled 2013-09-10: qty 1

## 2013-09-10 MED ORDER — SODIUM CHLORIDE 0.9 % IV SOLN
INTRAVENOUS | Status: DC
Start: 2013-09-10 — End: 2013-09-12
  Administered 2013-09-11 (×2): via INTRAVENOUS
  Administered 2013-09-12: 500 mL via INTRAVENOUS

## 2013-09-10 MED ORDER — PANTOPRAZOLE SODIUM 40 MG IV SOLR
INTRAVENOUS | Status: AC
  Administered 2013-09-10: 80 mg
  Filled 2013-09-10: qty 80

## 2013-09-10 MED ORDER — ONDANSETRON HCL 4 MG/2ML IJ SOLN: 4.0000 mg | Freq: Four times a day (QID) | INTRAMUSCULAR | Status: DC | PRN

## 2013-09-10 MED ORDER — SODIUM CHLORIDE 0.9 % IJ SOLN
3.0000 mL | Freq: Two times a day (BID) | INTRAMUSCULAR | Status: DC
  Administered 2013-09-10: 3 mL via INTRAVENOUS

## 2013-09-10 MED ORDER — METOPROLOL TARTRATE 100 MG PO TABS
100.0000 mg | ORAL_TABLET | Freq: Two times a day (BID) | ORAL | Status: DC
  Administered 2013-09-10 – 2013-09-12 (×4): 100 mg via ORAL
  Filled 2013-09-10 (×6): qty 1

## 2013-09-10 MED ORDER — ACETAMINOPHEN 325 MG PO TABS: 650.0000 mg | ORAL_TABLET | Freq: Four times a day (QID) | ORAL | Status: DC | PRN

## 2013-09-10 MED ORDER — PANTOPRAZOLE SODIUM 40 MG IV SOLR
80.0000 mg | Freq: Once | INTRAVENOUS | Status: AC
  Administered 2013-09-10: 80 mg via INTRAVENOUS

## 2013-09-10 MED ORDER — DILTIAZEM HCL ER COATED BEADS 240 MG PO CP24
240.0000 mg | ORAL_CAPSULE | Freq: Every day | ORAL | Status: DC
  Administered 2013-09-11 – 2013-09-12 (×2): 240 mg via ORAL
  Filled 2013-09-10 (×2): qty 1

## 2013-09-10 MED ORDER — PANTOPRAZOLE SODIUM 40 MG IV SOLR
40.0000 mg | Freq: Two times a day (BID) | INTRAVENOUS | Status: DC
Start: 2013-09-14 — End: 2013-09-10

## 2013-09-10 MED ORDER — LEVOFLOXACIN IN D5W 500 MG/100ML IV SOLN
500.0000 mg | INTRAVENOUS | Status: DC
  Administered 2013-09-10: 500 mg via INTRAVENOUS
  Filled 2013-09-10 (×2): qty 100

## 2013-09-10 MED ORDER — TRIAMTERENE-HCTZ 37.5-25 MG PO TABS: ORAL_TABLET | ORAL | Status: DC

## 2013-09-10 MED ORDER — MORPHINE SULFATE 2 MG/ML IJ SOLN: 1.0000 mg | INTRAMUSCULAR | Status: DC | PRN

## 2013-09-10 MED ORDER — PANTOPRAZOLE SODIUM 40 MG IV SOLR
INTRAVENOUS | Status: AC
  Filled 2013-09-10: qty 80

## 2013-09-10 MED ORDER — SODIUM CHLORIDE 0.9 % IV SOLN
8.0000 mg/h | INTRAVENOUS | Status: DC
  Administered 2013-09-10: 8 mg/h via INTRAVENOUS
  Filled 2013-09-10 (×5): qty 80

## 2013-09-10 MED ORDER — ACETAMINOPHEN 650 MG RE SUPP: 650.0000 mg | Freq: Four times a day (QID) | RECTAL | Status: DC | PRN

## 2013-09-10 NOTE — Progress Notes (Signed)
Subjective:    Patient ID: Donna Ortega, female    DOB: 11-20-1925, 80 y.o.   MRN: [id number]  HPI [name] is here for acute visit.  She reports rectal bleeding constant for last 2 days.  She has  Significant Gi Bleed form PUD in past.  She does reports epigastric dyspepsia.    NO N/V  SHe has been using Alleve pm or ADvil pm every night to go to sleep .  She called her GI MD yesterday who advised her to go to urgent care but pt did not go.       Allergies  Allergen Reactions  . Amiodarone Hives    ALL OVER BODY HIVES/ ITCH  . Aspirin     Gi bleed  . Lipitor [Atorvastatin Calcium]     MUSCLE ACHES  . Niaspan [Niacin Er] Rash   Past Medical History  Diagnosis Date  . Coronary artery disease 2007    3 vessel CABG with LIMA-LAD, SVG to diag, and SVG - Ramus  . Hyperlipidemia   . Hypertension   . History of atrial fibrillation; review of all ECG are most consistent with atrial tachycardia     had rash with amiodarone; no anticoagulation due to GI bleed in December 2012  . History of hyperkalemia   . Hyponatremia   . Diverticulitis   . Palpitations   . History of GI bleed Dec 2012  . Hearing loss of left ear 07/2013   Past Surgical History  Procedure Laterality Date  . Maze    . Coronary artery bypass graft  Feb 2007    Dr. Cyndia Bent;   Marland Kitchen Partial hysterectomy    . Esophagogastroduodenoscopy  08/04/2011    Procedure: ESOPHAGOGASTRODUODENOSCOPY (EGD);  Surgeon: Cleotis Nipper, MD;  Location: [facility] ENDOSCOPY;  Service: Endoscopy;  Laterality: N/A;  do at bedside  . Abdominal hysterectomy    . Pace maker     History   Social History  . Marital Status: Single    Spouse Name: N/A    Number of Children: N/A  . Years of Education: N/A   Occupational History  . Not on file.   Social History Main Topics  . Smoking status: Never Smoker   . Smokeless tobacco: Not on file  . Alcohol Use: No  . Drug Use: No  . Sexual Activity: No   Other Topics Concern  . Not on file    Social History Narrative   Ambulatory usually independently, plays golf   Lives at home with family   Daughter in law Leilanny Fluitt [phone]   Family History  Problem Relation Age of Onset  . Heart failure Father   . Kidney failure Father   . Bone cancer Brother   . Kidney failure Sister   . Colon cancer Sister   . Cancer Sister    Patient Active Problem List   Diagnosis Date Noted  . Anemia 06/26/2013  . Osteoporosis 06/18/2013  . Osteopenia 05/05/2013  . Palpitation 02/19/2013  . Insomnia 02/18/2013  . Candida esophagitis 01/22/2013  . Gastric ulcer 01/22/2013  . UTI (urinary tract infection) 01/17/2013  . Atrial tachycardia 05/22/2012  . Pacemaker-Medtronic 02/09/2012  . Sinus node dysfunction 02/06/2012  . Syncope and collapse 02/06/2012  . Rash 09/19/2011  . UTI (lower urinary tract infection) 08/21/2011  . Fatigue 08/21/2011  . Peptic ulcer disease with hemorrhage 08/14/2011  . S/P vertebroplasty 08/14/2011  . Chronic kidney disease (CKD), stage III (moderate) 08/05/2011  . Anemia associated  with acute blood loss 08/04/2011  . GI bleed 08/04/2011  . Dyslipidemia 08/04/2011  . Melena 08/03/2011  . CAD (coronary artery disease) s/p CABG 01/11/2011  . Atrial fibrillation/flutter 01/11/2011  . HTN (hypertension) 01/11/2011   Current Outpatient Prescriptions on File Prior to Visit  Medication Sig Dispense Refill  . Calcium Carbonate-Vitamin D (CALTRATE 600+D PO) Take 1 tablet by mouth daily.       Marland Kitchen diltiazem (CARDIZEM CD) 240 MG 24 hr capsule Take 1 capsule (240 mg total) by mouth daily.  90 capsule  3  . lisinopril (PRINIVIL,ZESTRIL) 40 MG tablet TAKE 1 TABLET DAILY  90 tablet  3  . metoprolol (LOPRESSOR) 100 MG tablet Take 1 tablet (100 mg total) by mouth 2 (two) times daily.  180 tablet  3  . Multiple Vitamins-Minerals (CENTRUM SILVER PO) Take by mouth daily.      . pravastatin (PRAVACHOL) 80 MG tablet TAKE 1 TABLET DAILY  90 tablet  3  . UNKNOWN TO PATIENT  Take 1 tablet by mouth daily. "little pink allergy pill"      . aspirin 81 MG tablet Take 81 mg by mouth every other day.       . Ibuprofen-Diphenhydramine Cit (ADVIL PM PO) Take 1 tablet by mouth at bedtime as needed.       No current facility-administered medications on file prior to visit.       Review of Systems See HPI    Objective:   Physical Exam  Physical Exam  Nursing note and vitals reviewed.  Constitutional: She is oriented to person, place, and time. She appears well-developed and well-nourished.  HENT:  Head: Normocephalic and atraumatic.  Cardiovascular: Normal rate and regular rhythm. Exam reveals no gallop and no friction rub.  No murmur heard.  Pulmonary/Chest: Breath sounds normal. She has no wheezes. She has no rales.  Abd:  BS hyperactive  Minimal pain in epigastrium.  Rectal  Melenic stool strongly positive Neurological: She is alert and oriented to person, place, and time.  Skin: Skin is warm and dry.  Psychiatric: She has a normal mood and affect. Her behavior is normal.            Assessment & Plan:  Rectal bleed  /NSAID use  Will transport to ER for stat labs and further management based on results  I spoke with son [name] and informed

## 2013-09-10 NOTE — Progress Notes (Signed)
Adm.  Nurse, Izora Gala called to obtain adm hx.

## 2013-09-10 NOTE — Consult Note (Signed)
Reason for Consult: GI bleed Referring Physician: Hospital team  Donna Ortega is an 80 y.o. female.  HPI: Patient familiar to my partner Dr. Jacinto Reap. and her hospital computer chart and office computer chart was reviewed and she's been on aspirin and nonsteroidals at night without a pump inhibitor and has a history of ulcers but prior to the bleeding starting  on Saturday where it was mostly bright red but her stools were normal but black she's not had any other GI complaint specifically no upper tract symptoms abdominal pain diarrhea or swallowing issues and she says she had a colonoscopy in [place] but she doesn't know when or by whom and her previous ones have been in [place] and her previous endoscopy by my partner was reviewed done a few years ago and her family history is negative from a GI standpoint and she has no other complaints Past Medical History  Diagnosis Date   Coronary artery disease 2007    3 vessel CABG with LIMA-LAD, SVG to diag, and SVG - Ramus   Hyperlipidemia    Hypertension    History of atrial fibrillation; review of all ECG are most consistent with atrial tachycardia     had rash with amiodarone; no anticoagulation due to GI bleed in December 2012   History of hyperkalemia    Hyponatremia    Diverticulitis    Palpitations    History of GI bleed Dec 2012   Hearing loss of left ear 07/2013    Past Surgical History  Procedure Laterality Date   Maze     Coronary artery bypass graft  Feb 2007    Dr. Cyndia Bent;    Partial hysterectomy     Esophagogastroduodenoscopy  08/04/2011    Procedure: ESOPHAGOGASTRODUODENOSCOPY (EGD);  Surgeon: Cleotis Nipper, MD;  Location: [facility] ENDOSCOPY;  Service: Endoscopy;  Laterality: N/A;  do at bedside   Abdominal hysterectomy     Pace maker      Family History  Problem Relation Age of Onset   Heart failure Father    Kidney failure Father    Bone cancer Brother    Kidney failure Sister    Colon cancer Sister     Cancer Sister     Social History:  reports that she has never smoked. She does not have any smokeless tobacco history on file. She reports that she does not drink alcohol or use illicit drugs.  Allergies:  Allergies  Allergen Reactions   Amiodarone Hives    ALL OVER BODY HIVES/ ITCH   Aspirin     Gi bleed   Lipitor [Atorvastatin Calcium]     MUSCLE ACHES   Niaspan [Niacin Er] Rash    Medications: I have reviewed the patient's current medications.  Results for orders placed during the hospital encounter of 09/10/13 (from the past 48 hour(s))  OCCULT BLOOD X 1 CARD TO LAB, STOOL     Status: Abnormal   Collection Time    09/10/13 12:11 PM      Result Value Range   Fecal Occult Bld POSITIVE (*) NEGATIVE  CBC WITH DIFFERENTIAL     Status: Abnormal   Collection Time    09/10/13 12:20 PM      Result Value Range   WBC 6.5  4.0 - 10.5 K/uL   RBC 3.47 (*) 3.87 - 5.11 MIL/uL   Hemoglobin 11.1 (*) 12.0 - 15.0 g/dL   HCT 33.0 (*) 36.0 - 46.0 %   MCV 95.1  78.0 - 100.0 fL  MCH 32.0  26.0 - 34.0 pg   MCHC 33.6  30.0 - 36.0 g/dL   RDW 12.0  11.5 - 15.5 %   Platelets 225  150 - 400 K/uL   Neutrophils Relative % 70  43 - 77 %   Neutro Abs 4.5  1.7 - 7.7 K/uL   Lymphocytes Relative 19  12 - 46 %   Lymphs Abs 1.2  0.7 - 4.0 K/uL   Monocytes Relative 9  3 - 12 %   Monocytes Absolute 0.6  0.1 - 1.0 K/uL   Eosinophils Relative 2  0 - 5 %   Eosinophils Absolute 0.1  0.0 - 0.7 K/uL   Basophils Relative 0  0 - 1 %   Basophils Absolute 0.0  0.0 - 0.1 K/uL  COMPREHENSIVE METABOLIC PANEL     Status: Abnormal   Collection Time    09/10/13 12:20 PM      Result Value Range   Sodium 133 (*) 137 - 147 mEq/L   Potassium 5.3  3.7 - 5.3 mEq/L   Chloride 93 (*) 96 - 112 mEq/L   CO2 26  19 - 32 mEq/L   Glucose, Bld 97  70 - 99 mg/dL   BUN 31 (*) 6 - 23 mg/dL   Creatinine, Ser 0.90  0.50 - 1.10 mg/dL   Calcium 9.5  8.4 - 10.5 mg/dL   Total Protein 7.0  6.0 - 8.3 g/dL   Albumin 3.9  3.5  - 5.2 g/dL   AST 18  0 - 37 U/L   ALT 14  0 - 35 U/L   Alkaline Phosphatase 51  39 - 117 U/L   Total Bilirubin 0.3  0.3 - 1.2 mg/dL   GFR calc non Af Amer 57 (*) >90 mL/min   GFR calc Af Amer 66 (*) >90 mL/min   Comment: (NOTE)     The eGFR has been calculated using the CKD EPI equation.     This calculation has not been validated in all clinical situations.     eGFR's persistently <90 mL/min signify possible Chronic Kidney     Disease.  PROTIME-INR     Status: None   Collection Time    09/10/13 12:20 PM      Result Value Range   Prothrombin Time 12.9  11.6 - 15.2 seconds   INR 0.99  0.00 - 1.49    No results found.  ROS negative except above Blood pressure 134/69, pulse 60, temperature 97.7 F (36.5 C), temperature source Oral, resp. rate 16, height _0  (1.575 m), weight 63.957 kg (141 lb), SpO2 98.00%. Physical Exam vital signs stable afebrile no acute distress exam pertinent for her abdomen being soft nontender hemoglobin stable for her slight increase BUN  Assessment/Plan: GI bleeding with some upper and lower component in patient on aspirin and nonsteroidals history of ulcers and no pump inhibitor Plan: We discussed endoscopy first and will proceed tomorrow morning however might need another colonoscopy depending on those findings and if bright red blood continues and we will try to get her previous colonoscopy report from her primary Dr. and please call us sooner if question or problem  Elam Ellis E 09/10/2013, 5:44 PM

## 2013-09-10 NOTE — Progress Notes (Signed)
C/o rectal bleeding with bowel movements since Saturday. Has history of gi bleeding. Called Dr. Bucchini's office and he told her to go to [facility] but she didn't.

## 2013-09-10 NOTE — ED Notes (Signed)
BRB in stool when going to bathroom. Pt has history of same and states last time she was admitted and found to have ulcers.NAD noted at this time. Denies pain

## 2013-09-10 NOTE — Patient Instructions (Signed)
To er

## 2013-09-10 NOTE — ED Provider Notes (Signed)
CSN: [id number]     Arrival date & time 09/10/13  1105 History   First MD Initiated Contact with Patient 09/10/13 1202     Chief Complaint  Patient presents with  . Rectal Bleeding   (Consider location/radiation/quality/duration/timing/severity/associated sxs/prior Treatment) HPI 80 year old female comes in today because of rectal bleeding. She states that this began on Saturday and she has had one to 2 bloody bowel movements per day since then. She went into see her primary care physician Dr. Coralyn Mark today and was sent down here for further evaluation. She endorses that she has had some weakness and lightheadedness. She states that she has had an episode of bleeding in the past that they thought was secondary to ulcers in her stomach. She also endorses that she has a history of diverticulitis. She denies any abdominal pain with this. She has not had nausea or vomiting. She states that the blood is present in the stool and she cannot tell how much blood there is. Past Medical History  Diagnosis Date  . Coronary artery disease 2007    3 vessel CABG with LIMA-LAD, SVG to diag, and SVG - Ramus  . Hyperlipidemia   . Hypertension   . History of atrial fibrillation; review of all ECG are most consistent with atrial tachycardia     had rash with amiodarone; no anticoagulation due to GI bleed in December 2012  . History of hyperkalemia   . Hyponatremia   . Diverticulitis   . Palpitations   . History of GI bleed Dec 2012  . Hearing loss of left ear 07/2013   Past Surgical History  Procedure Laterality Date  . Maze    . Coronary artery bypass graft  Feb 2007    Dr. Cyndia Bent;   Marland Kitchen Partial hysterectomy    . Esophagogastroduodenoscopy  08/04/2011    Procedure: ESOPHAGOGASTRODUODENOSCOPY (EGD);  Surgeon: Cleotis Nipper, MD;  Location: [facility] ENDOSCOPY;  Service: Endoscopy;  Laterality: N/A;  do at bedside  . Abdominal hysterectomy    . Pace maker     Family History  Problem Relation Age of Onset   . Heart failure Father   . Kidney failure Father   . Bone cancer Brother   . Kidney failure Sister   . Colon cancer Sister   . Cancer Sister    History  Substance Use Topics  . Smoking status: Never Smoker   . Smokeless tobacco: Not on file  . Alcohol Use: No   OB History   Grav Para Term Preterm Abortions TAB SAB Ect Mult Living                 Review of Systems  All other systems reviewed and are negative.    Allergies  Amiodarone; Aspirin; Lipitor; and Niaspan  Home Medications   Current Outpatient Rx  Name  Route  Sig  Dispense  Refill  . aspirin 81 MG tablet   Oral   Take 81 mg by mouth every other day.          . Calcium Carbonate-Vitamin D (CALTRATE 600+D PO)   Oral   Take 1 tablet by mouth daily.          Marland Kitchen diltiazem (CARDIZEM CD) 240 MG 24 hr capsule   Oral   Take 1 capsule (240 mg total) by mouth daily.   90 capsule   3   . diphenhydrAMINE (BENADRYL) 25 MG tablet   Oral   Take 25 mg by mouth every 6 (six) hours  as needed.         . Ibuprofen-Diphenhydramine Cit (ADVIL PM PO)   Oral   Take 1 tablet by mouth at bedtime as needed.         Marland Kitchen lisinopril (PRINIVIL,ZESTRIL) 40 MG tablet      TAKE 1 TABLET DAILY   90 tablet   3   . metoprolol (LOPRESSOR) 100 MG tablet   Oral   Take 1 tablet (100 mg total) by mouth 2 (two) times daily.   180 tablet   3   . Multiple Vitamins-Minerals (CENTRUM SILVER PO)   Oral   Take by mouth daily.         . pravastatin (PRAVACHOL) 80 MG tablet      TAKE 1 TABLET DAILY   90 tablet   3   . triamterene-hydrochlorothiazide (MAXZIDE-25) 37.5-25 MG per tablet      1/2 tablet every other day.   15 tablet   5   . UNKNOWN TO PATIENT   Oral   Take 1 tablet by mouth daily. "little pink allergy pill"          BP 146/82  Pulse 62  Temp(Src) 97.7 F (36.5 C) (Oral)  Resp 16  Ht 5\' 2"  (1.575 m)  Wt 141 lb (63.957 kg)  BMI 25.78 kg/m2  SpO2 100% Physical Exam  Nursing note and vitals  reviewed. Constitutional: She is oriented to person, place, and time. She appears well-developed and well-nourished.  HENT:  Head: Normocephalic and atraumatic.  Right Ear: External ear normal.  Left Ear: External ear normal.  Mucous membranes are somewhat pale  Eyes: EOM are normal. Pupils are equal, round, and reactive to light.  Pupils are constricted bilaterally  Neck: Normal range of motion. Neck supple.  Cardiovascular: Regular rhythm.   Pulmonary/Chest: Effort normal and breath sounds normal.  Abdominal: Soft. Bowel sounds are normal. She exhibits mass. She exhibits no distension. There is no tenderness. There is no rebound and no guarding.  Genitourinary:  Maroon blood on rectal exam  Musculoskeletal: Normal range of motion. She exhibits no edema and no tenderness.  Neurological: She is alert and oriented to person, place, and time.  Skin: Skin is warm and dry.  Psychiatric: She has a normal mood and affect. Her behavior is normal. Judgment and thought content normal.    ED Course  Procedures (including critical care time) Labs Review Labs Reviewed  OCCULT BLOOD X 1 CARD TO LAB, STOOL - Abnormal; Notable for the following:    Fecal Occult Bld POSITIVE (*)    All other components within normal limits  CBC WITH DIFFERENTIAL - Abnormal; Notable for the following:    RBC 3.47 (*)    Hemoglobin 11.1 (*)    HCT 33.0 (*)    All other components within normal limits  COMPREHENSIVE METABOLIC PANEL - Abnormal; Notable for the following:    Sodium 133 (*)    Chloride 93 (*)    BUN 31 (*)    GFR calc non Af Amer 57 (*)    GFR calc Af Amer 66 (*)    All other components within normal limits  PROTIME-INR   Imaging Review No results found.  EKG Interpretation    Date/Time:  Tuesday September 10 2013 12:27:11 EST Ventricular Rate:  61 PR Interval:  266 QRS Duration: 80 QT Interval:  454 QTC Calculation: 457 R Axis:   55 Text Interpretation:  Atrial-paced rhythm with  prolonged AV conduction Nonspecific T wave abnormality Abnormal  ECG Confirmed by Vesper Trant MD, Cerys Winget ([id number]) on 09/10/2013 1:10:25 PM            MDM  No diagnosis found. 80 year old female with active GI bleed who is hemodynamically stable. Her gastroenterologist is Dr. Cristina Gong plan discussion with the hospitalist for transfer for further observation and treatment. Patient's care discussed with Dr. Verlon Au. Marland Kitchen She will be placed in a step down bed at [facility]. Protonix drip is ordered per his request as is a CBC to be drawn at 6 PM this evening.  Shaune Pollack, MD 09/10/13 [phone]

## 2013-09-10 NOTE — H&P (Addendum)
[facility] History and Physical  Donna Ortega KYH:[id number] DOB: 28-Dec-1925 DOA: 09/10/2013  Referring physician: Dr Jeanell Sparrow PCP: Kelton Pillar, MD   Chief Complaint: blood in the stool.   HPI: Donna Ortega is a 80 y.o. female with PMH significant for PUD, A fib, hyponatremia, hypertension who presents to ED complaining of melena and bright red blood in the stool for last 4 days prior to admission. She relates one episode of bright red blood, table spoon amount, daily. Last episode was morning of admission. She denies hematemesis, abdominal pain, chest pain or dyspnea. She has been taking Advil PM for last couple of months.   Review of Systems:  Negative except as per HPI.   Past Medical History  Diagnosis Date  . Coronary artery disease 2007    3 vessel CABG with LIMA-LAD, SVG to diag, and SVG - Ramus  . Hyperlipidemia   . Hypertension   . History of atrial fibrillation; review of all ECG are most consistent with atrial tachycardia     had rash with amiodarone; no anticoagulation due to GI bleed in December 2012  . History of hyperkalemia   . Hyponatremia   . Diverticulitis   . Palpitations   . History of GI bleed Dec 2012  . Hearing loss of left ear 07/2013   Past Surgical History  Procedure Laterality Date  . Maze    . Coronary artery bypass graft  Feb 2007    Dr. Cyndia Bent;   Marland Kitchen Partial hysterectomy    . Esophagogastroduodenoscopy  08/04/2011    Procedure: ESOPHAGOGASTRODUODENOSCOPY (EGD);  Surgeon: Cleotis Nipper, MD;  Location: [facility] ENDOSCOPY;  Service: Endoscopy;  Laterality: N/A;  do at bedside  . Abdominal hysterectomy    . Pace maker     Social History:  reports that she has never smoked. She does not have any smokeless tobacco history on file. She reports that she does not drink alcohol or use illicit drugs.  Allergies  Allergen Reactions  . Amiodarone Hives    ALL OVER BODY HIVES/ ITCH  . Aspirin     Gi bleed  . Lipitor [Atorvastatin Calcium]     MUSCLE  ACHES  . Niaspan [Niacin Er] Rash    Family History  Problem Relation Age of Onset  . Heart failure Father   . Kidney failure Father   . Bone cancer Brother   . Kidney failure Sister   . Colon cancer Sister   . Cancer Sister      Prior to Admission medications   Medication Sig Start Date End Date Taking? Authorizing Provider  aspirin 81 MG tablet Take 81 mg by mouth every other day.    Yes Historical Provider, MD  Calcium Carbonate-Vitamin D (CALTRATE 600+D PO) Take 1 tablet by mouth daily.    Yes Historical Provider, MD  diltiazem (CARDIZEM CD) 240 MG 24 hr capsule Take 1 capsule (240 mg total) by mouth daily. 11/12/12 11/12/13 Yes Thayer Headings, MD  diphenhydrAMINE (BENADRYL) 25 MG tablet Take 25 mg by mouth every 6 (six) hours as needed.   Yes Historical Provider, MD  Ibuprofen-Diphenhydramine Cit (ADVIL PM PO) Take 1 tablet by mouth at bedtime as needed.   Yes Historical Provider, MD  lisinopril (PRINIVIL,ZESTRIL) 40 MG tablet TAKE 1 TABLET DAILY 07/15/13  Yes Thayer Headings, MD  metoprolol (LOPRESSOR) 100 MG tablet Take 1 tablet (100 mg total) by mouth 2 (two) times daily. 02/19/13  Yes Deboraha Sprang, MD  Multiple Vitamins-Minerals (CENTRUM SILVER  PO) Take by mouth daily.   Yes Historical Provider, MD  pravastatin (PRAVACHOL) 80 MG tablet TAKE 1 TABLET DAILY 07/15/13  Yes Thayer Headings, MD  triamterene-hydrochlorothiazide (MAXZIDE-25) 37.5-25 MG per tablet 1/2 tablet every other day. 09/10/13  Yes Thayer Headings, MD  UNKNOWN TO PATIENT Take 1 tablet by mouth daily. "little pink allergy pill"    Historical Provider, MD   Physical Exam: Filed Vitals:   09/10/13 1402  BP: 134/69  Pulse: 60  Temp:   Resp:     BP 134/69  Pulse 60  Temp(Src) 97.7 F (36.5 C) (Oral)  Resp 16  Ht 5\' 2"  (1.575 m)  Wt 63.957 kg (141 lb)  BMI 25.78 kg/m2  SpO2 98%  General:  Appears calm and comfortable Eyes: PERRL, normal lids, irises & conjunctiva ENT: grossly normal hearing, lips &  tongue Neck: no LAD, masses or thyromegaly Cardiovascular: RRR, no m/r/g. No LE edema. Telemetry: SR, no arrhythmias  Respiratory: CTA bilaterally, no w/r/r. Normal respiratory effort. Abdomen: soft, ntnd Skin: no rash or induration seen on limited exam Musculoskeletal: grossly normal tone BUE/BLE Psychiatric: grossly normal mood and affect, speech fluent and appropriate Neurologic: grossly non-focal.          Labs on Admission:  Basic Metabolic Panel:  Recent Labs Lab 09/10/13 1220  NA 133*  K 5.3  CL 93*  CO2 26  GLUCOSE 97  BUN 31*  CREATININE 0.90  CALCIUM 9.5   Liver Function Tests:  Recent Labs Lab 09/10/13 1220  AST 18  ALT 14  ALKPHOS 51  BILITOT 0.3  PROT 7.0  ALBUMIN 3.9   No results found for this basename: LIPASE, AMYLASE,  in the last 168 hours No results found for this basename: AMMONIA,  in the last 168 hours CBC:  Recent Labs Lab 09/10/13 1220  WBC 6.5  NEUTROABS 4.5  HGB 11.1*  HCT 33.0*  MCV 95.1  PLT 225   Cardiac Enzymes: No results found for this basename: CKTOTAL, CKMB, CKMBINDEX, TROPONINI,  in the last 168 hours  BNP (last 3 results) No results found for this basename: PROBNP,  in the last 8760 hours CBG: No results found for this basename: GLUCAP,  in the last 168 hours  Radiological Exams on Admission: No results found.  EKG: Independently reviewed. Sinus.   Assessment/Plan Principal Problem:   GI bleed Active Problems:   Atrial fibrillation/flutter   HTN (hypertension)   Melena   Chronic kidney disease (CKD), stage III (moderate)  1-GI bleed, Melena: Patient with prior history of peptic ulcer diseases. Admit to step down. Repeat hb tonight and am. Start clear diet. Dr Watt Climes will see patient in am. Continue with protonix Gtt. NPO after midnight anticipating endoscopy in am. Advil discontinue. Aspirin stopped. BUN elevated at 31.   2-A fib: continue with metoprolol, and Cardizem. Holder parameters for BP.   3-Hypertension: hold Maxzide.  4-Mild hyponatremia; monitor,.   Addendum:  Cough: check chest x ray.   Code Status: Full Code.  Family Communication: Care discussed with patient.  Disposition Plan: expect 2 to 3 days inpatient.   Time spent: 75 minutes.   [facility] Pager [phone]

## 2013-09-11 ENCOUNTER — Encounter (HOSPITAL_COMMUNITY)
Admission: EM | Disposition: A | Discharge status: Home / Self Care | Payer: Self-pay | Source: Home / Self Care | Attending: Internal Medicine

## 2013-09-11 ENCOUNTER — Encounter (HOSPITAL_COMMUNITY): Payer: Self-pay | Admitting: *Deleted

## 2013-09-11 HISTORY — PX: ESOPHAGOGASTRODUODENOSCOPY: SHX5428

## 2013-09-11 LAB — CBC
HCT: 29 % — ABNORMAL LOW (ref 36.0–46.0)
HEMOGLOBIN: 10 g/dL — AB (ref 12.0–15.0)
MCH: 31.7 pg (ref 26.0–34.0)
MCHC: 34.5 g/dL (ref 30.0–36.0)
MCV: 92.1 fL (ref 78.0–100.0)
Platelets: 207 10*3/uL (ref 150–400)
RBC: 3.15 MIL/uL — AB (ref 3.87–5.11)
RDW: 12.6 % (ref 11.5–15.5)
WBC: 5.5 10*3/uL (ref 4.0–10.5)

## 2013-09-11 LAB — BASIC METABOLIC PANEL
BUN: 21 mg/dL (ref 6–23)
CO2: 23 meq/L (ref 19–32)
Calcium: 8.8 mg/dL (ref 8.4–10.5)
Chloride: 99 mEq/L (ref 96–112)
Creatinine, Ser: 0.89 mg/dL (ref 0.50–1.10)
GFR calc Af Amer: 67 mL/min — ABNORMAL LOW (ref >90)
GFR calc non Af Amer: 57 mL/min — ABNORMAL LOW (ref >90)
Glucose, Bld: 89 mg/dL (ref 70–99)
POTASSIUM: 4.4 meq/L (ref 3.7–5.3)
SODIUM: 136 meq/L — AB (ref 137–147)

## 2013-09-11 SURGERY — EGD (ESOPHAGOGASTRODUODENOSCOPY)
Anesthesia: Moderate Sedation

## 2013-09-11 MED ORDER — MIDAZOLAM HCL 5 MG/ML IJ SOLN
INTRAMUSCULAR | Status: AC
  Filled 2013-09-11: qty 1

## 2013-09-11 MED ORDER — MIDAZOLAM HCL 10 MG/2ML IJ SOLN
INTRAMUSCULAR | Status: DC | PRN
  Administered 2013-09-11: 2 mg via INTRAVENOUS

## 2013-09-11 MED ORDER — BUTAMBEN-TETRACAINE-BENZOCAINE 2-2-14 % EX AERO
INHALATION_SPRAY | CUTANEOUS | Status: DC | PRN
  Administered 2013-09-11: 1 via TOPICAL

## 2013-09-11 MED ORDER — FENTANYL CITRATE 0.05 MG/ML IJ SOLN
INTRAMUSCULAR | Status: AC
  Filled 2013-09-11: qty 2

## 2013-09-11 MED ORDER — SODIUM CHLORIDE 0.9 % IV SOLN: INTRAVENOUS | Status: DC

## 2013-09-11 MED ORDER — DIPHENHYDRAMINE HCL 25 MG PO TABS
25.0000 mg | ORAL_TABLET | Freq: Four times a day (QID) | ORAL | Status: DC | PRN
  Filled 2013-09-11: qty 1

## 2013-09-11 MED ORDER — POLYETHYLENE GLYCOL 3350 17 GM/SCOOP PO POWD
255.0000 g | Freq: Once | ORAL | Status: AC
  Administered 2013-09-11: 255 g via ORAL
  Filled 2013-09-11: qty 255

## 2013-09-11 MED ORDER — PANTOPRAZOLE SODIUM 40 MG PO TBEC: 40.0000 mg | DELAYED_RELEASE_TABLET | Freq: Two times a day (BID) | ORAL | Status: DC

## 2013-09-11 MED ORDER — FENTANYL CITRATE 0.05 MG/ML IJ SOLN
INTRAMUSCULAR | Status: DC | PRN
  Administered 2013-09-11 (×2): 25 ug via INTRAVENOUS

## 2013-09-11 MED ORDER — MAGNESIUM CITRATE PO SOLN
1.0000 | Freq: Once | ORAL | Status: AC
  Administered 2013-09-11: 1 via ORAL
  Filled 2013-09-11: qty 296

## 2013-09-11 NOTE — Progress Notes (Signed)
[facility]  Donna Ortega PJK:[id number] DOB: 1925-08-26 DOA: 09/10/2013 PCP: Kelton Pillar, MD  Brief narrative: 80 y.o. female with PMH significant for PUD, A fib, CAD, hyponatremia, and hypertension who presented to ED complaining of melena and bright red blood in the stool for last 4 days prior to admission. She endorsed one episode of bright red blood. Last episode was on morning of admission. She denied hematemesis, abdominal pain, chest pain or dyspnea. She had been taking Advil PM for last couple of months.  Assessment/Plan:    Melena/GI bleed -GI following -EGD today only with mild antral gastritis -plan for colonoscopy in am -clears with bowel prep   ? Pneumonia -radiologist commented as to this but no sx's including leukocytosis or fever/cough so will dc anbx's -O2 sats high so can likely wean/dc O2    Atrial fibrillation/flutter -cont Cardizem - rate controlled     Anemia associated with acute blood loss -minimal drop in hgb - cont to follow    Chronic kidney disease (CKD), stage III (moderate) -renal fnx stable    CAD (coronary artery disease) s/p CABG. -no sx's    HTN (hypertension) -BP controlled    Dyslipidemia   DVT prophylaxis: SCDs Code Status: Full Family Communication: No family at bedside Disposition Plan/Expected LOS: Transfer to floor  Consultants: GI  Procedures: EGD:  1 small hiatal hernia with a widely patent fibrous ring  2 multiple small antral erosions consistent with aspirin and  nonsteroidal-induced 3. Otherwise within normal limits EGD  Antibiotics: Levaquin1/27   HPI/Subjective: Examined in endo lab - coughing immediately post porcedure but this was not present per procedure according to RN. No further bleeding endorsed.  Objective: Blood pressure 151/59, pulse 68, temperature 98.1 F (36.7 C), temperature source Oral, resp. rate 15, height 5\' 2"  (1.575 m), weight 138 lb 0.1 oz  (62.6 kg), SpO2 100.00%.  Intake/Output Summary (Last 24 hours) at 09/11/13 1146 Last data filed at 09/11/13 1100  Gross per 24 hour  Intake 947.17 ml  Output    800 ml  Net 147.17 ml   Exam: General: No acute respiratory distress Lungs: Clear to auscultation bilaterally without wheezes or crackles,  2 L Cardiovascular: Regular rate and rhythm without murmur gallop or rub, no peripheral edema or JVD Abdomen: Nontender, nondistended, soft, bowel sounds positive, no rebound, no ascites, no appreciable mass Musculoskeletal: No significant cyanosis, clubbing of bilateral lower extremities Neurological: Alert and oriented x name and place at least, moves all extremities x 4 without focal neurological deficits, CN 2-12 intact  Scheduled Meds:  Scheduled Meds: . diltiazem  240 mg Oral Daily  . levofloxacin (LEVAQUIN) IV  500 mg Intravenous Q24H  . magnesium citrate  1 Bottle Oral Once  . metoprolol  100 mg Oral BID  . polyethylene glycol powder  255 g Oral Once  . sodium chloride  3 mL Intravenous Q12H   Continuous Infusions: . sodium chloride 50 mL/hr at 09/11/13 1059  . sodium chloride    . pantoprozole (PROTONIX) infusion 8 mg/hr (09/10/13 1350)    Data Reviewed: Basic Metabolic Panel:  Recent Labs Lab 09/10/13 1220 09/11/13 0348  NA 133* 136*  K 5.3 4.4  CL 93* 99  CO2 26 23  GLUCOSE 97 89  BUN 31* 21  CREATININE 0.90 0.89  CALCIUM 9.5 8.8   Liver Function Tests:  Recent Labs Lab 09/10/13 1220  AST 18  ALT 14  ALKPHOS 51  BILITOT  0.3  PROT 7.0  ALBUMIN 3.9   CBC:  Recent Labs Lab 09/10/13 1220 09/10/13 1845 09/11/13 0348  WBC 6.5 7.2 5.5  NEUTROABS 4.5 4.1  --   HGB 11.1* 11.3* 10.0*  HCT 33.0* 32.9* 29.0*  MCV 95.1 92.2 92.1  PLT 225 228 207    Recent Results (from the past 240 hour(s))  MRSA PCR SCREENING     Status: None   Collection Time    09/10/13  4:12 PM      Result Value Range Status   MRSA by PCR NEGATIVE  NEGATIVE Final    Comment:            The GeneXpert MRSA Assay (FDA     approved for NASAL specimens     only), is one component of a     comprehensive MRSA colonization     surveillance program. It is not     intended to diagnose MRSA     infection nor to guide or     monitor treatment for     MRSA infections.     Studies:  Recent x-ray studies have been reviewed in detail by the Attending Physician  Time spent :  43mins     Allison Ellis, ANP [facility] Office  [phone] Pager [phone]  **If unable to reach the above provider after paging please contact the [facility] @ [phone]  On-Call/Text Page:      [email or web address]      password [email or web address]  If 7PM-7AM, please contact night-coverage [email or web address] Password [facility] 09/11/2013, 11:46 AM   LOS: 1 day   I have personally examined this patient and reviewed the entire database. I have reviewed the above note, made any necessary editorial changes, and agree with its content.  Cherene Altes, MD [facility]

## 2013-09-11 NOTE — Progress Notes (Signed)
Transferred to rm 22 at 1605. Skin intact, no c/o, call bell within reach. Will continue to monitor.

## 2013-09-11 NOTE — Op Note (Signed)
[facility] [place], [id number]   ENDOSCOPY PROCEDURE REPORT  PATIENT: Donna, Ortega  MR#: [id number] BIRTHDATE: 1925/11/06 , 87  yrs. old GENDER: Female  ENDOSCOPIST: Clarene Essex, MD REFERRED [id number] Coralyn Mark, M.D.  PROCEDURE DATE:  09/11/2013 PROCEDURE:   EGD, diagnostic ASA CLASS:   Class II INDICATIONS:Hematochezia and Melena.  mild anemia  MEDICATIONS: Fentanyl 50 mcg IV and Versed 2 mg IV  TOPICAL ANESTHETIC:used  DESCRIPTION OF PROCEDURE:   After the risks benefits and alternatives of the procedure were thoroughly explained, informed consent was obtained.  The Pentax Gastroscope [id number]  endoscope was introduced through the mouth and advanced to the second portion of the duodenum , limited by Without limitations.   The instrument was slowly withdrawn as the mucosa was fully examined.the findings are recorded below but there was no signs of active bleeding seen         FINDINGS:1 small hiatal hernia with a widely patent fibrous ring 2 multiple small antral erosions consistent with aspirin and nonsteroidal-induced 3. Otherwise within normal limits EGD  COMPLICATIONS:none  ENDOSCOPIC IMPRESSION:above   RECOMMENDATIONS:I believe patient probably should have colonoscopy just to be sure we are trying to get the records from her primary care physician in the meantime to see when her last exam was in the case was discussed with the hospital team however she should probably be on long-term pump inhibitor and reevaluate aspirin needs and no nonsteroidals or Advil p.m. long-term   REPEAT EXAM: as needed   _______________________________ Clarene Essex, MD eSigned:  Clarene Essex, MD 09/11/2013 10:04 AM    [id number] Coralyn Mark, MD  PATIENT NAME:  Donna, Ortega MR#: [id number]

## 2013-09-12 ENCOUNTER — Encounter (HOSPITAL_COMMUNITY)
Admission: EM | Disposition: A | Discharge status: Home / Self Care | Payer: Medicare HMO | Source: Home / Self Care | Attending: Internal Medicine

## 2013-09-12 ENCOUNTER — Encounter (HOSPITAL_COMMUNITY): Payer: Self-pay | Admitting: Gastroenterology

## 2013-09-12 DIAGNOSIS — I2510 Atherosclerotic heart disease of native coronary artery without angina pectoris: Secondary | ICD-10-CM

## 2013-09-12 DIAGNOSIS — K921 Melena: Secondary | ICD-10-CM

## 2013-09-12 HISTORY — PX: COLONOSCOPY: SHX5424

## 2013-09-12 LAB — CBC
HCT: 28.3 % — ABNORMAL LOW (ref 36.0–46.0)
HEMOGLOBIN: 9.7 g/dL — AB (ref 12.0–15.0)
MCH: 31.6 pg (ref 26.0–34.0)
MCHC: 34.3 g/dL (ref 30.0–36.0)
MCV: 92.2 fL (ref 78.0–100.0)
Platelets: 198 10*3/uL (ref 150–400)
RBC: 3.07 MIL/uL — ABNORMAL LOW (ref 3.87–5.11)
RDW: 12.6 % (ref 11.5–15.5)
WBC: 5.7 10*3/uL (ref 4.0–10.5)

## 2013-09-12 LAB — BASIC METABOLIC PANEL
BUN: 11 mg/dL (ref 6–23)
CALCIUM: 8.2 mg/dL — AB (ref 8.4–10.5)
CO2: 22 mEq/L (ref 19–32)
Chloride: 101 mEq/L (ref 96–112)
Creatinine, Ser: 0.81 mg/dL (ref 0.50–1.10)
GFR calc Af Amer: 75 mL/min — ABNORMAL LOW (ref >90)
GFR, EST NON AFRICAN AMERICAN: 64 mL/min — AB (ref >90)
Glucose, Bld: 89 mg/dL (ref 70–99)
Potassium: 4.1 mEq/L (ref 3.7–5.3)
Sodium: 134 mEq/L — ABNORMAL LOW (ref 137–147)

## 2013-09-12 SURGERY — COLONOSCOPY
Anesthesia: Moderate Sedation

## 2013-09-12 MED ORDER — PANTOPRAZOLE SODIUM 40 MG PO TBEC: 40.0000 mg | DELAYED_RELEASE_TABLET | Freq: Every day | ORAL | Status: DC

## 2013-09-12 MED ORDER — MIDAZOLAM HCL 5 MG/ML IJ SOLN
INTRAMUSCULAR | Status: AC
  Filled 2013-09-12: qty 2

## 2013-09-12 MED ORDER — FENTANYL CITRATE 0.05 MG/ML IJ SOLN
INTRAMUSCULAR | Status: DC | PRN
  Administered 2013-09-12 (×2): 25 ug via INTRAVENOUS

## 2013-09-12 MED ORDER — MIDAZOLAM HCL 5 MG/5ML IJ SOLN
INTRAMUSCULAR | Status: DC | PRN
  Administered 2013-09-12 (×2): 2 mg via INTRAVENOUS

## 2013-09-12 MED ORDER — FENTANYL CITRATE 0.05 MG/ML IJ SOLN
INTRAMUSCULAR | Status: AC
  Filled 2013-09-12: qty 2

## 2013-09-12 NOTE — Progress Notes (Signed)
Discuss case with hospital team and patient's son on the phone and answered all of his questions and brought him up to date on her colonoscopy today and endoscopy yesterday as well as discharge instructions and to avoid nonsteroidal and discuss with cardiology to low as possible aspirin as she can take and as an aside he told me her previous colonoscopy was 4 years ago in [place] by a Dr. Cindee Lame and just showed diverticuli as well

## 2013-09-12 NOTE — Discharge Summary (Signed)
Physician Discharge Summary  Donna Ortega ERX:[id number] DOB: 1926-05-01 DOA: 09/10/2013  PCP: Kelton Pillar, MD  Admit date: 09/10/2013 Discharge date: 09/12/2013  Time spent: 50* minutes  Recommendations for Outpatient Follow-up:  1. Follow up PCP in 2 weeks  Discharge Diagnoses:  Active Problems:   CAD (coronary artery disease) s/p CABG   Atrial fibrillation/flutter   HTN (hypertension)   Melena   Anemia associated with acute blood loss   GI bleed   Dyslipidemia   Chronic kidney disease (CKD), stage III (moderate)   Discharge Condition: Stable  Diet recommendation: Low salt diet  Filed Weights   09/10/13 1136 09/10/13 1550 09/11/13 1607  Weight: 63.957 kg (141 lb) 62.6 kg (138 lb 0.1 oz) 65 kg (143 lb 4.8 oz)    History of present illness:  80 y.o. female with PMH significant for PUD, A fib, CAD, hyponatremia, and hypertension who presented to ED complaining of melena and bright red blood in the stool for last 4 days prior to admission. She endorsed one episode of bright red blood. Last episode was on morning of admission. She denied hematemesis, abdominal pain, chest pain or dyspnea. She had been taking Advil PM for last couple of months   Hospital Course:   Melena/GI bleed  -GI saw the patient in the hospital -EGD today only with mild antral gastritis  -Colonoscopy showed universal diverticulosis - No more bleeding - Hold aspirin for five days then restart on 09/18/13 - Will send home on Protonix 40 mg po daily - Advised to avoid Aleve, advil and only take tylenol pm if needed  ? Pneumonia  -radiologist commented as to this but no sx's including leukocytosis or fever/cough so will dc anbx's   Atrial fibrillation/flutter  -cont Cardizem - rate controlled   Anemia associated with acute blood loss  -minimal drop in hgb - c - Hb today is 9.7  Chronic kidney disease (CKD), stage III (moderate)  -renal fnx stable   CAD (coronary artery disease) s/p CABG.   -no sx's   HTN (hypertension)  --BP controlled  - continue the home medications  Dyslipidemia        Procedures: EGD- EGD:  1 small hiatal hernia with a widely patent fibrous ring  2 multiple small antral erosions consistent with aspirin and  nonsteroidal-induced 3. Otherwise within normal limits EGD     Colonoscopy Universal diverticulosis  Consultations:  GI  Discharge Exam: Filed Vitals:   09/12/13 1048  BP:   Pulse: 73  Temp:   Resp:     General: Appear in no acute distress* Cardiovascular: s1s2 RRR Respiratory: *Clear bilaterally  Discharge Instructions  Discharge Orders   Future Appointments Provider Department Dept Phone   10/01/2013 9:30 AM Lanice Shirts, MD [facility] [phone]   11/29/2013 9:05 AM [facility] [place] Office [phone]   Future Orders Complete By Expires   Diet - low sodium heart healthy  As directed    Discharge instructions  As directed    Comments:     Stop aspirin for next five days and resume aspirin on 09/18/13   Increase activity slowly  As directed        Medication List    STOP taking these medications       ADVIL PM PO      TAKE these medications       aspirin EC 81 MG tablet  Take 81 mg by mouth every other day.  CALTRATE 600+D PO  Take 1 tablet by mouth daily.     CENTRUM SILVER ADULT 50+ PO  Take 1 tablet by mouth daily.     diltiazem 240 MG 24 hr capsule  Commonly known as:  CARDIZEM CD  Take 1 capsule (240 mg total) by mouth daily.     diphenhydrAMINE 25 MG tablet  Commonly known as:  BENADRYL  Take 25 mg by mouth every 6 (six) hours as needed for sleep.     lisinopril 40 MG tablet  Commonly known as:  PRINIVIL,ZESTRIL  Take 40 mg by mouth daily.     metoprolol 100 MG tablet  Commonly known as:  LOPRESSOR  Take 1 tablet (100 mg total) by mouth 2 (two) times daily.     pantoprazole 40 MG tablet  Commonly known as:   PROTONIX  Take 1 tablet (40 mg total) by mouth daily.     pravastatin 80 MG tablet  Commonly known as:  PRAVACHOL  Take 80 mg by mouth daily.     triamterene-hydrochlorothiazide 37.5-25 MG per tablet  Commonly known as:  MAXZIDE-25  1/2 tablet every other day.       Allergies  Allergen Reactions  . Amiodarone Hives    ALL OVER BODY HIVES/ ITCH  . Aspirin     Gi bleed  . Lipitor [Atorvastatin Calcium]     MUSCLE ACHES  . Niaspan [Niacin Er] Rash       Follow-up Information   Follow up with SCHOENHOFF,DEBBIE, MD In 2 weeks.   Specialty:  Internal Medicine   Contact information:   [address] [place] [address] [phone]        The results of significant diagnostics from this hospitalization (including imaging, microbiology, ancillary and laboratory) are listed below for reference.    Significant Diagnostic Studies: Dg Chest 2 View  09/10/2013   CLINICAL DATA:  Cough.  EXAM: CHEST  2 VIEW  COMPARISON:  DG CHEST 2 VIEW dated 02/09/2012; DG CHEST 2 VIEW dated 08/20/2011; DG CHEST 2 VIEW dated 02/06/2012  FINDINGS: Dual lead left subclavian pacemaker. Cardiomegaly. Cardiopericardial silhouette has enlarged compared to the prior exam. There is new airspace opacity projecting over the lower thoracic spine, likely in the retrocardiac region. This probably represents pneumonia abut asymmetric pulmonary edema is in the differential considerations. Chronic thoracic compression fractures and vertebral augmentation. There may be tiny bilateral pleural effusions evident on the lateral view. Followup to ensure radiographic clearing and exclude an underlying lesion is recommended. Typically clearing will be observed at 8 weeks.  IMPRESSION: New retrocardiac/left lower lobe airspace disease suspicious for pneumonia.   Electronically Signed   By: Dereck Ligas M.D.   On: 09/10/2013 21:40    Microbiology: Recent Results (from the past 240 hour(s))  MRSA PCR SCREENING      Status: None   Collection Time    09/10/13  4:12 PM      Result Value Range Status   MRSA by PCR NEGATIVE  NEGATIVE Final   Comment:            The GeneXpert MRSA Assay (FDA     approved for NASAL specimens     only), is one component of a     comprehensive MRSA colonization     surveillance program. It is not     intended to diagnose MRSA     infection nor to guide or     monitor treatment for  MRSA infections.     Labs: Basic Metabolic Panel:  Recent Labs Lab 09/10/13 1220 09/11/13 0348 09/12/13 0704  NA 133* 136* 134*  K 5.3 4.4 4.1  CL 93* 99 101  CO2 26 23 22   GLUCOSE 97 89 89  BUN 31* 21 11  CREATININE 0.90 0.89 0.81  CALCIUM 9.5 8.8 8.2*   Liver Function Tests:  Recent Labs Lab 09/10/13 1220  AST 18  ALT 14  ALKPHOS 51  BILITOT 0.3  PROT 7.0  ALBUMIN 3.9   No results found for this basename: LIPASE, AMYLASE,  in the last 168 hours No results found for this basename: AMMONIA,  in the last 168 hours CBC:  Recent Labs Lab 09/10/13 1220 09/10/13 1845 09/11/13 0348 09/12/13 0704  WBC 6.5 7.2 5.5 5.7  NEUTROABS 4.5 4.1  --   --   HGB 11.1* 11.3* 10.0* 9.7*  HCT 33.0* 32.9* 29.0* 28.3*  MCV 95.1 92.2 92.1 92.2  PLT 225 228 207 198   Cardiac Enzymes: No results found for this basename: CKTOTAL, CKMB, CKMBINDEX, TROPONINI,  in the last 168 hours BNP: BNP (last 3 results) No results found for this basename: PROBNP,  in the last 8760 hours CBG: No results found for this basename: GLUCAP,  in the last 168 hours     Signed:  Cathleen Yagi S  [facility] 09/12/2013, 12:37 PM

## 2013-09-12 NOTE — Op Note (Signed)
[facility] [place], [address]   COLONOSCOPY PROCEDURE REPORT  PATIENT: Donna Ortega, Donna Ortega  MR#: [id number] BIRTHDATE: [birth year] , 44  yrs. old GENDER: Female ENDOSCOPIST: Acquanetta Sit, MD REFERRED BY: PROCEDURE DATE:  09/12/2013 PROCEDURE:   colonoscopy ASA CLASS:   3 INDICATIONS:rectal bleeding MEDICATIONS: fentanyl 75 mcg IV, Versed 5 mg IV  DESCRIPTION OF PROCEDURE:   After the risks benefits and alternatives of the procedure were thoroughly explained, informed consent was obtained.  digital rectal exam was normal.        The Pentax Ped Colon [id number]  endoscope was introduced through the anus and advanced to the cecum     . No adverse events experienced. The quality of the prep was fair.       The instrument was then slowly withdrawn as the colon was fully examined.  The cecum looked normal. There were numerous diverticuli in the ascending colon, there were also diverticula in the transverse colon. There were numerous diverticula in the descending colon and sigmoid colon. The rectum looked normal. No other abnormalities were seen.    The time to cecum=  .  Withdrawal time=  .  The scope was withdrawn and the procedure completed. COMPLICATIONS: There were no complications.  ENDOSCOPIC IMPRESSION: universal diverticulosis  RECOMMENDATIONS:advance diet. Watch for further signs of bleeding. I believe her rectal bleeding was of diverticular origin given the fact that there are extensive diverticula in the colon.   eSigned:  Acquanetta Sit, MD 09/12/2013 9:02 AM   cc:

## 2013-09-12 NOTE — Care Management Note (Signed)
    Page 1 of 1   09/12/2013     4:16:22 PM   CARE MANAGEMENT NOTE 09/12/2013  Patient:  Donna Ortega, Donna Ortega   Account Number:  [id number]  Date Initiated:  09/12/2013  Documentation initiated by:  Tomi Bamberger  Subjective/Objective Assessment:   dx afib, cad, gib  admit     Action/Plan:   Anticipated DC Date:  09/12/2013   Anticipated DC Plan:  [place]  CM consult      Choice offered to / List presented to:             Status of service:  Completed, signed off Medicare Important Message given?   (If response is "NO", the following Medicare IM given date fields will be blank) Date Medicare IM given:   Date Additional Medicare IM given:    Discharge Disposition:  HOME/SELF CARE  Per UR Regulation:  Reviewed for med. necessity/level of care/duration of stay  If discussed at [place] of Stay Meetings, dates discussed:    Comments:

## 2013-09-12 NOTE — Progress Notes (Signed)
Pt discharged with son. Pt a&ox4, pt in no distress and denies any further needs at this time. PIV discontinued, pt tolerated well. Discharge instructions provided, prescriptions given, pt verbalized understanding. Pt escorted via wheel chair to front lobby.

## 2013-09-13 ENCOUNTER — Encounter (HOSPITAL_COMMUNITY): Payer: Self-pay | Admitting: Gastroenterology

## 2013-10-01 ENCOUNTER — Ambulatory Visit: Payer: Medicare Other | Admitting: Internal Medicine

## 2013-10-02 ENCOUNTER — Encounter: Payer: Self-pay | Admitting: *Deleted

## 2013-10-07 ENCOUNTER — Ambulatory Visit: Payer: Medicare HMO | Admitting: Internal Medicine

## 2013-10-07 DIAGNOSIS — Z09 Encounter for follow-up examination after completed treatment for conditions other than malignant neoplasm: Secondary | ICD-10-CM

## 2013-10-09 ENCOUNTER — Encounter: Payer: Self-pay | Admitting: Internal Medicine

## 2013-10-09 ENCOUNTER — Other Ambulatory Visit: Payer: Self-pay | Admitting: Internal Medicine

## 2013-10-09 ENCOUNTER — Ambulatory Visit (INDEPENDENT_AMBULATORY_CARE_PROVIDER_SITE_OTHER): Payer: Managed Care, Other (non HMO) | Admitting: Internal Medicine

## 2013-10-09 VITALS — BP 95/62 | HR 116 | Temp 98.3°F | Resp 18 | Wt 141.0 lb

## 2013-10-09 DIAGNOSIS — K922 Gastrointestinal hemorrhage, unspecified: Secondary | ICD-10-CM

## 2013-10-09 DIAGNOSIS — H9190 Unspecified hearing loss, unspecified ear: Secondary | ICD-10-CM

## 2013-10-09 DIAGNOSIS — R9389 Abnormal findings on diagnostic imaging of other specified body structures: Secondary | ICD-10-CM

## 2013-10-09 DIAGNOSIS — K259 Gastric ulcer, unspecified as acute or chronic, without hemorrhage or perforation: Secondary | ICD-10-CM | POA: Insufficient documentation

## 2013-10-09 DIAGNOSIS — K2960 Other gastritis without bleeding: Secondary | ICD-10-CM

## 2013-10-09 DIAGNOSIS — R918 Other nonspecific abnormal finding of lung field: Secondary | ICD-10-CM

## 2013-10-09 DIAGNOSIS — H9193 Unspecified hearing loss, bilateral: Secondary | ICD-10-CM | POA: Insufficient documentation

## 2013-10-09 LAB — CBC WITH DIFFERENTIAL/PLATELET
Basophils Absolute: 0 10*3/uL (ref 0.0–0.1)
Basophils Relative: 0 % (ref 0–1)
Eosinophils Absolute: 0.2 10*3/uL (ref 0.0–0.7)
Eosinophils Relative: 3 % (ref 0–5)
HCT: 35.8 % — ABNORMAL LOW (ref 36.0–46.0)
Hemoglobin: 12 g/dL (ref 12.0–15.0)
LYMPHS ABS: 1.6 10*3/uL (ref 0.7–4.0)
LYMPHS PCT: 25 % (ref 12–46)
MCH: 30.8 pg (ref 26.0–34.0)
MCHC: 33.5 g/dL (ref 30.0–36.0)
MCV: 91.8 fL (ref 78.0–100.0)
Monocytes Absolute: 0.5 10*3/uL (ref 0.1–1.0)
Monocytes Relative: 8 % (ref 3–12)
NEUTROS ABS: 4.2 10*3/uL (ref 1.7–7.7)
Neutrophils Relative %: 64 % (ref 43–77)
Platelets: 222 10*3/uL (ref 150–400)
RBC: 3.9 MIL/uL (ref 3.87–5.11)
RDW: 13.1 % (ref 11.5–15.5)
WBC: 6.5 10*3/uL (ref 4.0–10.5)

## 2013-10-09 LAB — BASIC METABOLIC PANEL
BUN: 23 mg/dL (ref 6–23)
CO2: 23 mEq/L (ref 19–32)
Calcium: 9.1 mg/dL (ref 8.4–10.5)
Chloride: 102 mEq/L (ref 96–112)
Creat: 1.09 mg/dL (ref 0.50–1.10)
Glucose, Bld: 136 mg/dL — ABNORMAL HIGH (ref 70–99)
POTASSIUM: 4.4 meq/L (ref 3.5–5.3)
Sodium: 134 mEq/L — ABNORMAL LOW (ref 135–145)

## 2013-10-09 NOTE — Patient Instructions (Signed)
See me in 3 months   To lab and xray today  NO Advil pm  No Aleve NO motrin  NO Ibuprofen

## 2013-10-09 NOTE — Progress Notes (Signed)
Subjective:    Patient ID: Donna Ortega, female    DOB: December 09, 1925, 80 y.o.   MRN: [id number]  HPI Donna Ortega is here for [place] .  Admitted with GI bleed and found to have multiple gastric erosions  (lots of NSAID use) and diverticulosis.  She has been taking lots of ADvil pm   See discharge note;  Advised to hold ASA for 5 days and then can resume coated 81 mg ASA  She states no further blood in stool    She notes decreased hearing both ears.  She Has been evaluated by Dr. Wilburn Cornelia and will be getting a new hearing aid this weeks  See CXR in hospital  No sob no cough  No chest pain  No fever  Allergies  Allergen Reactions  . Amiodarone Hives    ALL OVER BODY HIVES/ ITCH  . Aspirin     Gi bleed  . Lipitor [Atorvastatin Calcium]     MUSCLE ACHES  . Nsaids     GI bleed  . Niaspan [Niacin Er] Rash   Past Medical History  Diagnosis Date  . Coronary artery disease 2007    3 vessel CABG with LIMA-LAD, SVG to diag, and SVG - Ramus  . Hyperlipidemia   . Hypertension   . History of atrial fibrillation; review of all ECG are most consistent with atrial tachycardia     had rash with amiodarone; no anticoagulation due to GI bleed in December 2012  . History of hyperkalemia   . Hyponatremia   . Diverticulitis   . Palpitations   . History of GI bleed Dec 2012  . Hearing loss of left ear 07/2013  . GI bleeding 09/09/2013  . Arthritis     osteo   in back   Past Surgical History  Procedure Laterality Date  . [place]    . Coronary artery bypass graft  Feb 2007    Dr. Cyndia Bent;   Marland Kitchen Partial hysterectomy    . Esophagogastroduodenoscopy  08/04/2011    Procedure: ESOPHAGOGASTRODUODENOSCOPY (EGD);  Surgeon: Cleotis Nipper, MD;  Location: [facility] ENDOSCOPY;  Service: Endoscopy;  Laterality: N/A;  do at bedside  . Abdominal hysterectomy    . Psychologist, forensic    . Esophagogastroduodenoscopy N/A 09/11/2013    Procedure: ESOPHAGOGASTRODUODENOSCOPY (EGD);  Surgeon: Jeryl Columbia, MD;  Location: [facility] ENDOSCOPY;   Service: Endoscopy;  Laterality: N/A;  . Colonoscopy N/A 09/12/2013    Procedure: COLONOSCOPY;  Surgeon: Wonda Horner, MD;  Location: [facility] ENDOSCOPY;  Service: Endoscopy;  Laterality: N/A;   History   Social History  . Marital Status: Single    Spouse Name: N/A    Number of Children: N/A  . Years of Education: N/A   Occupational History  . Not on file.   Social History Main Topics  . Smoking status: Never Smoker   . Smokeless tobacco: Never Used  . Alcohol Use: No  . Drug Use: No  . Sexual Activity: No   Other Topics Concern  . Not on file   Social History Narrative   Ambulatory usually independently, plays golf   Lives at home with family   Daughter in law Donna Ortega [phone]   Family History  Problem Relation Age of Onset  . Heart failure Father   . Kidney failure Father   . Bone cancer Brother   . Kidney failure Sister   . Colon cancer Sister   . Cancer Sister    Patient Active Problem List  Diagnosis Date Noted  . Gastric erosions 10/09/2013  . Anemia 06/26/2013  . Osteoporosis 06/18/2013  . Osteopenia 05/05/2013  . Palpitation 02/19/2013  . Insomnia 02/18/2013  . Candida esophagitis 01/22/2013  . Gastric ulcer 01/22/2013  . UTI (urinary tract infection) 01/17/2013  . Atrial tachycardia 05/22/2012  . Pacemaker-Medtronic 02/09/2012  . Sinus node dysfunction 02/06/2012  . Syncope and collapse 02/06/2012  . Rash 09/19/2011  . UTI (lower urinary tract infection) 08/21/2011  . Fatigue 08/21/2011  . Peptic ulcer disease with hemorrhage 08/14/2011  . S/P vertebroplasty 08/14/2011  . Chronic kidney disease (CKD), stage III (moderate) 08/05/2011  . Anemia associated with acute blood loss 08/04/2011  . GI bleed 08/04/2011  . Dyslipidemia 08/04/2011  . Melena 08/03/2011  . CAD (coronary artery disease) s/p CABG 01/11/2011  . Atrial fibrillation/flutter 01/11/2011  . HTN (hypertension) 01/11/2011   Current Outpatient Prescriptions on File Prior to Visit    Medication Sig Dispense Refill  . aspirin EC 81 MG tablet Take 81 mg by mouth every other day.      . Calcium Carbonate-Vitamin D (CALTRATE 600+D PO) Take 1 tablet by mouth daily.       Marland Kitchen diltiazem (CARDIZEM CD) 240 MG 24 hr capsule Take 1 capsule (240 mg total) by mouth daily.  90 capsule  3  . diphenhydrAMINE (BENADRYL) 25 MG tablet Take 25 mg by mouth every 6 (six) hours as needed for sleep.      Marland Kitchen lisinopril (PRINIVIL,ZESTRIL) 40 MG tablet Take 40 mg by mouth daily.      . metoprolol (LOPRESSOR) 100 MG tablet Take 1 tablet (100 mg total) by mouth 2 (two) times daily.  180 tablet  3  . Multiple Vitamins-Minerals (CENTRUM SILVER ADULT 50+ PO) Take 1 tablet by mouth daily.      . pantoprazole (PROTONIX) 40 MG tablet Take 1 tablet (40 mg total) by mouth daily.  60 tablet  2  . pravastatin (PRAVACHOL) 80 MG tablet Take 80 mg by mouth daily.      Marland Kitchen triamterene-hydrochlorothiazide (MAXZIDE-25) 37.5-25 MG per tablet 1/2 tablet every other day.  15 tablet  5   No current facility-administered medications on file prior to visit.      Review of Systems See hpi    Objective:   Physical Exam  Physical Exam  Nursing note and vitals reviewed.  Constitutional: She is oriented to person, place, and time. She appears well-developed and well-nourished.  HENT:  Head: Normocephalic and atraumatic.  TM's  Canals clear   Serous effusion Left drum  R TM  Good landmarks Cardiovascular: Normal rate and regular rhythm. Exam reveals no gallop and no friction rub.  No murmur heard.  Pulmonary/Chest: Breath sounds normal. She has no wheezes. She has no rales.  Neurological: She is alert and oriented to person, place, and time.  Skin: Skin is warm and dry.  Psychiatric: She has a normal mood and affect. Her behavior is normal.       Assessment & Plan:  GI bleed  Advised ot hold ASA for next 2 weeks.  ADvised NO NSAIDS  OK to use Benadryl to help her sleep.  Recheck CBC BMP today  Abnormal CXR will  repeat today  Decreased hearing  Under eval Dr. Wilburn Cornelia  See me in 3 months

## 2013-10-10 ENCOUNTER — Encounter: Payer: Managed Care, Other (non HMO) | Admitting: Internal Medicine

## 2013-10-15 ENCOUNTER — Telehealth: Payer: Self-pay | Admitting: *Deleted

## 2013-10-15 LAB — HEMOGLOBIN A1C
HEMOGLOBIN A1C: 5.6 % (ref <5.7)
MEAN PLASMA GLUCOSE: 114 mg/dL (ref <117)

## 2013-10-15 NOTE — Telephone Encounter (Signed)
Spoke with Donna Ortega about her lab work.  She voiced understanding to come to appt fasting on 10/29/13.

## 2013-10-15 NOTE — Telephone Encounter (Signed)
Notified pt of lab results 

## 2013-10-15 NOTE — Telephone Encounter (Signed)
Message copied by Conley Rolls on Tue Oct 15, 2013  4:11 PM ------      Message from: Emi Belfast D      Created: Tue Oct 15, 2013  7:32 AM       Jolayne Haines            Add a hgb aic to these labs if they still have blood ------

## 2013-10-15 NOTE — Telephone Encounter (Signed)
Message copied by Baldomero Lamy on Tue Oct 15, 2013  8:18 AM ------      Message from: Emi Belfast D      Created: Tue Oct 15, 2013  7:31 AM       Donna Ortega              Call pt and let her know that her labs look fine except her sugar is a little high  But she was not fasting.  Pt forgot to get her CXR downstairs on day of office visit.  ADvise her to come back for CXR and keep her next appt with me .  Have her come in fasting next visit and I will recheck her glucose then            Message back with her response ------

## 2013-10-15 NOTE — Telephone Encounter (Signed)
[facility] lab called and A1C added to last labs

## 2013-10-16 ENCOUNTER — Ambulatory Visit (HOSPITAL_BASED_OUTPATIENT_CLINIC_OR_DEPARTMENT_OTHER)
Admission: RE | Admit: 2013-10-16 | Discharge: 2013-10-16 | Disposition: A | Discharge status: Home / Self Care | Payer: Medicare HMO | Source: Ambulatory Visit | Attending: Internal Medicine | Admitting: Internal Medicine

## 2013-10-16 DIAGNOSIS — J189 Pneumonia, unspecified organism: Secondary | ICD-10-CM | POA: Insufficient documentation

## 2013-10-16 DIAGNOSIS — R9389 Abnormal findings on diagnostic imaging of other specified body structures: Secondary | ICD-10-CM

## 2013-10-22 ENCOUNTER — Encounter: Payer: Self-pay | Admitting: Cardiology

## 2013-10-22 ENCOUNTER — Ambulatory Visit (INDEPENDENT_AMBULATORY_CARE_PROVIDER_SITE_OTHER): Payer: Medicare HMO | Admitting: Cardiology

## 2013-10-22 VITALS — BP 120/86 | HR 67 | Wt 141.0 lb

## 2013-10-22 DIAGNOSIS — Z950 Presence of cardiac pacemaker: Secondary | ICD-10-CM

## 2013-10-22 DIAGNOSIS — I4891 Unspecified atrial fibrillation: Secondary | ICD-10-CM

## 2013-10-22 DIAGNOSIS — I495 Sick sinus syndrome: Secondary | ICD-10-CM

## 2013-10-22 DIAGNOSIS — I10 Essential (primary) hypertension: Secondary | ICD-10-CM

## 2013-10-22 DIAGNOSIS — I2510 Atherosclerotic heart disease of native coronary artery without angina pectoris: Secondary | ICD-10-CM

## 2013-10-22 NOTE — Patient Instructions (Signed)
Your physician recommends that you schedule a follow-up appointment in: 4 weeks with Dr. Klein.  Your physician recommends that you continue on your current medications as directed. Please refer to the Current Medication list given to you today.   

## 2013-10-23 ENCOUNTER — Telehealth: Payer: Self-pay | Admitting: Internal Medicine

## 2013-10-23 DIAGNOSIS — R911 Solitary pulmonary nodule: Secondary | ICD-10-CM

## 2013-10-23 NOTE — Telephone Encounter (Signed)
Spoke with pt.  And informed of lung nodule seen on CXR  Will set up Ct prior to her appt with me next week  Pt voices understanding

## 2013-10-25 ENCOUNTER — Ambulatory Visit (HOSPITAL_BASED_OUTPATIENT_CLINIC_OR_DEPARTMENT_OTHER)
Admission: RE | Admit: 2013-10-25 | Discharge: 2013-10-25 | Disposition: A | Discharge status: Home / Self Care | Payer: Medicare HMO | Source: Ambulatory Visit | Attending: Internal Medicine | Admitting: Internal Medicine

## 2013-10-25 DIAGNOSIS — R911 Solitary pulmonary nodule: Secondary | ICD-10-CM

## 2013-10-25 DIAGNOSIS — J984 Other disorders of lung: Secondary | ICD-10-CM | POA: Insufficient documentation

## 2013-10-25 DIAGNOSIS — I517 Cardiomegaly: Secondary | ICD-10-CM | POA: Insufficient documentation

## 2013-10-25 DIAGNOSIS — Z87311 Personal history of (healed) other pathological fracture: Secondary | ICD-10-CM | POA: Insufficient documentation

## 2013-10-28 LAB — MDC_IDC_ENUM_SESS_TYPE_INCLINIC
Battery Voltage: 3 V
Brady Statistic AP VP Percent: 1.4 %
Brady Statistic AP VS Percent: 63.95 %
Brady Statistic AS VP Percent: 5.69 %
Brady Statistic RA Percent Paced: 65.35 %
Date Time Interrogation Session: 20150310174041
Lead Channel Impedance Value: 464 Ohm
Lead Channel Sensing Intrinsic Amplitude: 1.8239
Lead Channel Setting Pacing Amplitude: 2 V
Lead Channel Setting Pacing Amplitude: 2.5 V
Lead Channel Setting Pacing Pulse Width: 0.4 ms
Lead Channel Setting Sensing Sensitivity: 0.9 mV
MDC IDC MSMT LEADCHNL RV IMPEDANCE VALUE: 416 Ohm
MDC IDC MSMT LEADCHNL RV SENSING INTR AMPL: 7.9782
MDC IDC SET ZONE DETECTION INTERVAL: 400 ms
MDC IDC STAT BRADY AS VS PERCENT: 28.96 %
MDC IDC STAT BRADY RV PERCENT PACED: 7.09 %
Zone Setting Detection Interval: 350 ms

## 2013-10-28 NOTE — Progress Notes (Addendum)
ELECTROPHYSIOLOGY OFFICE NOTE   Patient ID: Donna Ortega MRN: [id number], DOB/AGE: 1926/01/16   Date of Visit: 10/22/2013  Primary Physician: Kelton Pillar, MD  Primary Cardiologist: Acie Fredrickson, MD Primary EP: Caryl Comes, MD Reason for Visit: EP/device follow-up  History of Present Illness  DELISA Ortega is a 80 y.o. female with tachy-brady syndrome s/p PPM implant June 2013, atrial fibrillation and CAD s/p CABG who presents today for routine EP follow-up. She was previously taking Pradaxa but had GI bleeding and this was stopped. She was recently hospitalized with recurrent GI bleeding Jan 2015. She was evaluated by GI. EGD showed mild antral gastritis. Colonoscopy showed universal diverticulosis. She was instructed to hold aspirin, start PPI and avoid NSAIDs.  Today she reports she is doing well and has not had any further bleeding. As instructed at discharge, she resumed low dose aspirin last month. She has not had any recurrent symptoms of bleeding and denies nosebleeds, bright red blood per rectum, black or tarry stools or bloody urine. She denies any complaints. She denies CP or SOB. She denies palpitations, dizziness, near syncope or syncope. She denies LE swelling, orthopnea or PND. She is compliant with medications.  Past Medical History Past Medical History  Diagnosis Date  . Coronary artery disease 2007    3 vessel CABG with LIMA-LAD, SVG to diag, and SVG - Ramus  . Hyperlipidemia   . Hypertension   . History of atrial fibrillation; review of all ECG are most consistent with atrial tachycardia     had rash with amiodarone; no anticoagulation due to GI bleed in December 2012  . History of hyperkalemia   . Hyponatremia   . Diverticulitis   . Palpitations   . History of GI bleed Dec 2012  . Hearing loss of left ear 07/2013  . GI bleeding 09/09/2013  . Arthritis     osteo   in back    Past Surgical History Past Surgical History  Procedure Laterality Date  . Maze    .  Coronary artery bypass graft  Feb 2007    Dr. Cyndia Bent;   Marland Kitchen Partial hysterectomy    . Esophagogastroduodenoscopy  08/04/2011    Procedure: ESOPHAGOGASTRODUODENOSCOPY (EGD);  Surgeon: Cleotis Nipper, MD;  Location: [facility] ENDOSCOPY;  Service: Endoscopy;  Laterality: N/A;  do at bedside  . Abdominal hysterectomy    . Psychologist, forensic    . Esophagogastroduodenoscopy N/A 09/11/2013    Procedure: ESOPHAGOGASTRODUODENOSCOPY (EGD);  Surgeon: Jeryl Columbia, MD;  Location: [facility] ENDOSCOPY;  Service: Endoscopy;  Laterality: N/A;  . Colonoscopy N/A 09/12/2013    Procedure: COLONOSCOPY;  Surgeon: Wonda Horner, MD;  Location: [facility] ENDOSCOPY;  Service: Endoscopy;  Laterality: N/A;    Allergies/Intolerances Allergies  Allergen Reactions  . Amiodarone Hives    ALL OVER BODY HIVES/ ITCH  . Aspirin     Gi bleed  . Lipitor [Atorvastatin Calcium]     MUSCLE ACHES  . Nsaids     GI bleed  . Niaspan [Niacin Er] Rash    Current Home Medications Current Outpatient Prescriptions  Medication Sig Dispense Refill  . aspirin EC 81 MG tablet Take 81 mg by mouth once.       . Calcium Carbonate-Vitamin D (CALTRATE 600+D PO) Take 1 tablet by mouth daily.       Marland Kitchen diltiazem (CARDIZEM CD) 240 MG 24 hr capsule Take 1 capsule (240 mg total) by mouth daily.  90 capsule  3  . diphenhydrAMINE (BENADRYL) 25 MG tablet Take  25 mg by mouth every 6 (six) hours as needed for sleep.      Marland Kitchen lisinopril (PRINIVIL,ZESTRIL) 40 MG tablet Take 40 mg by mouth daily.      . metoprolol (LOPRESSOR) 100 MG tablet Take 1 tablet (100 mg total) by mouth 2 (two) times daily.  180 tablet  3  . Multiple Vitamins-Minerals (CENTRUM SILVER ADULT 50+ PO) Take 1 tablet by mouth daily.      . pantoprazole (PROTONIX) 40 MG tablet Take 1 tablet (40 mg total) by mouth daily.  60 tablet  2  . pravastatin (PRAVACHOL) 80 MG tablet Take 80 mg by mouth daily.      Marland Kitchen triamterene-hydrochlorothiazide (MAXZIDE-25) 37.5-25 MG per tablet 1/2 tablet every other day.  15 tablet  5     No current facility-administered medications for this visit.    Social History History   Social History  . Marital Status: Widowed    Spouse Name: N/A    Number of Children: N/A  . Years of Education: N/A   Occupational History  . Not on file.   Social History Main Topics  . Smoking status: Never Smoker   . Smokeless tobacco: Never Used  . Alcohol Use: No  . Drug Use: No  . Sexual Activity: No   Other Topics Concern  . Not on file   Social History Narrative   Ambulatory usually independently, plays golf   Lives at home with family   Daughter in law Mauria Asquith [phone]     Review of Systems General: No chills, fever, night sweats or weight changes Cardiovascular: No chest pain, dyspnea on exertion, edema, orthopnea, palpitations, paroxysmal nocturnal dyspnea Dermatological: No rash, lesions or masses Respiratory: No cough, dyspnea Urologic: No hematuria, dysuria Abdominal: No nausea, vomiting, diarrhea, bright red blood per rectum, melena, or hematemesis Neurologic: No visual changes, weakness, changes in mental status All other systems reviewed and are otherwise negative except as noted above.  Physical Exam Vitals: Blood pressure 120/86, pulse 67, weight 141 lb (63.957 kg).  General: Well developed, well appearing 80 y.o. female in no acute distress. HEENT: Normocephalic, atraumatic. EOMs intact. Sclera nonicteric. Oropharynx clear.  Neck: Supple. No JVD. Lungs: Respirations regular and unlabored, CTA bilaterally. No wheezes, rales or rhonchi. Heart: RRR. S1, S2 present. No murmurs, rub, S3 or S4. Abdomen: Soft, non-distended. Extremities: No clubbing, cyanosis or edema. PT/Radials 2+ and equal bilaterally. Psych: Normal affect. Neuro: Alert and oriented X 3. Moves all extremities spontaneously.   Diagnostics Recent BMET    Component Value Date/Time   NA 134* 10/09/2013 1459   K 4.4 10/09/2013 1459   CL 102 10/09/2013 1459   CO2 23 10/09/2013 1459    GLUCOSE 136* 10/09/2013 1459   BUN 23 10/09/2013 1459   CREATININE 1.09 10/09/2013 1459   CREATININE 0.81 09/12/2013 0704   CALCIUM 9.1 10/09/2013 1459   GFRNONAA 64* 09/12/2013 0704   GFRAA 75* 09/12/2013 0704   12-lead ECG today - A paced V sensed at 67 bpm; PR 312, QRS 74, QTc 433 Device interrogation today - Quick look for AFib burden - In AFib 19.7% of time. Today, A paced V sensed rhythm. Discussed adding Eliquis but will confer with Dr. Caryl Comes and GI. Patient with recent GI bleed Jan 2015.   Assessment and Plan 1. Paroxysmal atrial fibrillation In AFib 19.7% of time. Today, A paced V sensed rhythm. Chads2-Vasc score is 5. Discussed adding Eliquis (and stopping aspirin) but will confer with Dr. Caryl Comes and GI  due to recent GI bleed Jan 2015. ADDENDUM: Spoke with GI / Dr. Buccini's nurse last week and they feel she is stable for baby aspirin every other day with PPI and avoidance of NSAIDs. I updated Ms. Gentry and will discuss with Dr. Caryl Comes now that I have heard back from GI.  2. Tachy-brady syndrome s/p PPM implant 3. CAD 4. HTN  Signed, Ileene Hutchinson, PA-C 10/28/2013, 9:16 AM  ADDENDUM: Discussed plan for anticoagulation with Dr. Caryl Comes. Recommended she start Eliquis for stroke prevention. Stop aspirin.   Spoke with patient via phone regarding recommendations. She states she is "very reluctant" to start anticoagulation due to risk of bleeding as she has had GI bleeding in the past on both Pradaxa and aspirin alone (although aspirin was used in combination with NSAIDs Jan 2015 and GI has cleared her to restart aspirin but no NSAIDs). She is also concerned about cost of medication.  She requested information about Eliquis which was given. She wants to consider options, check with cost of medication with her pharmacy and will let us know when she has reached a decision. I also offered a consultation visit with our anticoagulation clinic but she declined. She already has follow-up scheduled  with Dr. Caryl Comes on 12/10/2013 which she was instructed to keep. She will call us if she has any further questions.  Azzie Roup [place], PA-C 11/21/2013 11:07 AM

## 2013-10-29 ENCOUNTER — Ambulatory Visit (INDEPENDENT_AMBULATORY_CARE_PROVIDER_SITE_OTHER): Payer: Medicare HMO | Admitting: Internal Medicine

## 2013-10-29 ENCOUNTER — Encounter: Payer: Self-pay | Admitting: Internal Medicine

## 2013-10-29 VITALS — BP 131/69 | HR 66 | Temp 97.9°F | Resp 18 | Wt 143.0 lb

## 2013-10-29 DIAGNOSIS — M4850XA Collapsed vertebra, not elsewhere classified, site unspecified, initial encounter for fracture: Secondary | ICD-10-CM | POA: Insufficient documentation

## 2013-10-29 DIAGNOSIS — R7309 Other abnormal glucose: Secondary | ICD-10-CM

## 2013-10-29 DIAGNOSIS — M810 Age-related osteoporosis without current pathological fracture: Secondary | ICD-10-CM

## 2013-10-29 DIAGNOSIS — K922 Gastrointestinal hemorrhage, unspecified: Secondary | ICD-10-CM

## 2013-10-29 MED ORDER — ALENDRONATE SODIUM 70 MG PO TABS: 70.0000 mg | ORAL_TABLET | ORAL | Status: DC

## 2013-10-29 NOTE — Progress Notes (Signed)
Subjective:    Patient ID: Donna Ortega, female    DOB: [birth year], 80 y.o.   MRN: [id number]  HPI Donna Ortega is here for follow up on several issues.  She is here with her son Donna Ortega.    Elevated glucose  AIC is 5.6%  Fasting accucheck today  CXR  Possible incidental  Nodule.  Follow up CT shows no mass  Osteoporosis:  She has not been on any medication in 4-5 years.  She denies any GI distress when on Fosamax  In thepast.  She has known compression fx S/P vertebral kyphoplasty.    Recent GI bleed  She is back on ASA 81 mg. Now.  No dark or bloody stools  Allergies  Allergen Reactions  . Amiodarone Hives    ALL OVER BODY HIVES/ ITCH  . Aspirin     Gi bleed  . Lipitor [Atorvastatin Calcium]     MUSCLE ACHES  . Nsaids     GI bleed  . Niaspan [Niacin Er] Rash   Past Medical History  Diagnosis Date  . Coronary artery disease 2007    3 vessel CABG with LIMA-LAD, SVG to diag, and SVG - Ramus  . Hyperlipidemia   . Hypertension   . History of atrial fibrillation; review of all ECG are most consistent with atrial tachycardia     had rash with amiodarone; no anticoagulation due to GI bleed in December 2012  . History of hyperkalemia   . Hyponatremia   . Diverticulitis   . Palpitations   . History of GI bleed Dec 2012  . Hearing loss of left ear 07/2013  . GI bleeding 09/09/2013  . Arthritis     osteo   in back   Past Surgical History  Procedure Laterality Date  . Maze    . Coronary artery bypass graft  Feb 2007    Dr. Cyndia Bent;   Marland Kitchen Partial hysterectomy    . Esophagogastroduodenoscopy  08/04/2011    Procedure: ESOPHAGOGASTRODUODENOSCOPY (EGD);  Surgeon: Cleotis Nipper, MD;  Location[facility] ENDOSCOPY;  Service: Endoscopy;  Laterality: N/A;  do at bedside  . Abdominal hysterectomy    . Psychologist, forensic    . Esophagogastroduodenoscopy N/A 09/11/2013    Procedure: ESOPHAGOGASTRODUODENOSCOPY (EGD);  Surgeon: Jeryl Columbia, MD;  Location: [facility] ENDOSCOPY;  Service: Endoscopy;  Laterality: N/A;   . Colonoscopy N/A 09/12/2013    Procedure: COLONOSCOPY;  Surgeon: Wonda Horner, MD;  Location: [facility] ENDOSCOPY;  Service: Endoscopy;  Laterality: N/A;   History   Social History  . Marital Status: Widowed    Spouse Name: N/A    Number of Children: N/A  . Years of Education: N/A   Occupational History  . Not on file.   Social History Main Topics  . Smoking status: Never Smoker   . Smokeless tobacco: Never Used  . Alcohol Use: No  . Drug Use: No  . Sexual Activity: No   Other Topics Concern  . Not on file   Social History Narrative   Ambulatory usually independently, plays golf   Lives at home with family   Daughter in law Donna Ortega [phone]   Family History  Problem Relation Age of Onset  . Heart failure Father   . Kidney failure Father   . Bone cancer Brother   . Kidney failure Sister   . Colon cancer Sister   . Cancer Sister    Patient Active Problem List   Diagnosis Date Noted  . Vertebral compression  fracture 10/29/2013  . Gastric erosions 10/09/2013  . Decreased hearing of both ears 10/09/2013  . Anemia 06/26/2013  . Osteoporosis 06/18/2013  . Osteopenia 05/05/2013  . Palpitation 02/19/2013  . Insomnia 02/18/2013  . Candida esophagitis 01/22/2013  . Gastric ulcer 01/22/2013  . UTI (urinary tract infection) 01/17/2013  . Atrial tachycardia 05/22/2012  . Pacemaker-Medtronic 02/09/2012  . Sinus node dysfunction 02/06/2012  . Syncope and collapse 02/06/2012  . Rash 09/19/2011  . UTI (lower urinary tract infection) 08/21/2011  . Fatigue 08/21/2011  . Peptic ulcer disease with hemorrhage 08/14/2011  . S/P vertebroplasty 08/14/2011  . Chronic kidney disease (CKD), stage III (moderate) 08/05/2011  . Anemia associated with acute blood loss 08/04/2011  . GI bleed 08/04/2011  . Dyslipidemia 08/04/2011  . Melena 08/03/2011  . CAD (coronary artery disease) s/p CABG 01/11/2011  . Atrial fibrillation/flutter 01/11/2011  . HTN (hypertension) 01/11/2011    Current Outpatient Prescriptions on File Prior to Visit  Medication Sig Dispense Refill  . aspirin EC 81 MG tablet Take 81 mg by mouth once.       . Calcium Carbonate-Vitamin D (CALTRATE 600+D PO) Take 1 tablet by mouth daily.       Marland Kitchen diltiazem (CARDIZEM CD) 240 MG 24 hr capsule Take 1 capsule (240 mg total) by mouth daily.  90 capsule  3  . diphenhydrAMINE (BENADRYL) 25 MG tablet Take 25 mg by mouth every 6 (six) hours as needed for sleep.      Marland Kitchen lisinopril (PRINIVIL,ZESTRIL) 40 MG tablet Take 40 mg by mouth daily.      . metoprolol (LOPRESSOR) 100 MG tablet Take 1 tablet (100 mg total) by mouth 2 (two) times daily.  180 tablet  3  . Multiple Vitamins-Minerals (CENTRUM SILVER ADULT 50+ PO) Take 1 tablet by mouth daily.      . pantoprazole (PROTONIX) 40 MG tablet Take 1 tablet (40 mg total) by mouth daily.  60 tablet  2  . pravastatin (PRAVACHOL) 80 MG tablet Take 80 mg by mouth daily.      Marland Kitchen triamterene-hydrochlorothiazide (MAXZIDE-25) 37.5-25 MG per tablet 1/2 tablet every other day.  15 tablet  5   No current facility-administered medications on file prior to visit.       Review of Systems See HPI    Objective:   Physical Exam Physical Exam  Nursing note and vitals reviewed.  Constitutional: She is oriented to person, place, and time. She appears well-developed and well-nourished.  HENT:  Head: Normocephalic and atraumatic.  Cardiovascular: Normal rate and regular rhythm. Exam reveals no gallop and no friction rub.  No murmur heard.  Pulmonary/Chest: Breath sounds normal. She has no wheezes. She has no rales.  Neurological: She is alert and oriented to person, place, and time.  Skin: Skin is warm and dry.  Psychiatric: She has a normal mood and affect. Her behavior is normal.        Assessment & Plan:  Incidental nodule CXR   CT neg  Elevated glucose  AIC and fasting glucose normal  Osteoporosis  Will start Fosamax 70 mg once a week.  Long discussion of GI side  effects,  Atypical femur fractures,  And ONJ.  No pending dental extractions and pt is to call if any GI distress or dysphagia.  Recent GI bleed  Advised no Nsaids for sleep  OK for benadryl.  Notify me if any further bleeding  See me in 2-3 months or prn

## 2013-10-29 NOTE — Patient Instructions (Addendum)
Do not use any Advil,  Ibuprofen or alleve.    Benadryl OK for sleep  .  Use tylenol 325 mg  Two tablets bid for aches and pains  See me in 2-3 months

## 2013-10-30 ENCOUNTER — Other Ambulatory Visit: Payer: Self-pay | Admitting: *Deleted

## 2013-10-30 DIAGNOSIS — I4891 Unspecified atrial fibrillation: Secondary | ICD-10-CM

## 2013-10-30 NOTE — Telephone Encounter (Signed)
Refill request

## 2013-10-31 MED ORDER — METOPROLOL TARTRATE 100 MG PO TABS: 100.0000 mg | ORAL_TABLET | Freq: Two times a day (BID) | ORAL | Status: DC

## 2013-11-08 ENCOUNTER — Telehealth: Payer: Self-pay | Admitting: Internal Medicine

## 2013-11-08 NOTE — Telephone Encounter (Signed)
New message     Patient stated someone call her today. Returning call back

## 2013-11-13 ENCOUNTER — Other Ambulatory Visit: Payer: Self-pay | Admitting: *Deleted

## 2013-11-13 MED ORDER — LISINOPRIL 40 MG PO TABS: 40.0000 mg | ORAL_TABLET | Freq: Every day | ORAL | Status: DC

## 2013-11-13 MED ORDER — PRAVASTATIN SODIUM 80 MG PO TABS: 80.0000 mg | ORAL_TABLET | Freq: Every day | ORAL | Status: DC

## 2013-11-13 MED ORDER — DILTIAZEM HCL ER COATED BEADS 240 MG PO CP24: 240.0000 mg | ORAL_CAPSULE | Freq: Every day | ORAL | Status: DC

## 2013-11-21 ENCOUNTER — Telehealth: Payer: Self-pay | Admitting: Cardiology

## 2013-11-21 NOTE — Telephone Encounter (Signed)
Discussed plan for anticoagulation with Dr. Caryl Comes. Recommended she start Eliquis 2.5 mg twice daily for stroke prevention. Stop aspirin.   Spoke with patient via phone regarding recommendations. She states she is "very reluctant" to start anticoagulation due to risk of bleeding as she has had GI bleeding in the past on both Pradaxa and aspirin alone (although aspirin was used in combination with NSAIDs Jan 2015 and GI has cleared her to restart aspirin but no NSAIDs). Please see my most recent office note. She is also concerned about cost of medication.  She requested information about Eliquis which was given. She wants to consider options, check with cost of medication with her pharmacy and will let us know when she has reached a decision. I also offered a consultation visit with our anticoagulation clinic but she declined. She already has follow-up scheduled with Dr. Caryl Comes on 12/10/2013 which she was instructed to keep. She will call us if she has any further questions.

## 2013-12-10 ENCOUNTER — Encounter: Payer: Self-pay | Admitting: Internal Medicine

## 2013-12-10 ENCOUNTER — Ambulatory Visit (INDEPENDENT_AMBULATORY_CARE_PROVIDER_SITE_OTHER): Payer: Medicare HMO | Admitting: Internal Medicine

## 2013-12-10 VITALS — BP 135/97 | HR 121 | Ht 62.0 in | Wt 142.0 lb

## 2013-12-10 DIAGNOSIS — I4891 Unspecified atrial fibrillation: Secondary | ICD-10-CM

## 2013-12-10 DIAGNOSIS — I498 Other specified cardiac arrhythmias: Secondary | ICD-10-CM

## 2013-12-10 DIAGNOSIS — Z950 Presence of cardiac pacemaker: Secondary | ICD-10-CM

## 2013-12-10 DIAGNOSIS — I495 Sick sinus syndrome: Secondary | ICD-10-CM

## 2013-12-10 DIAGNOSIS — I471 Supraventricular tachycardia: Secondary | ICD-10-CM

## 2013-12-10 NOTE — Patient Instructions (Signed)
Your physician recommends that you continue on your current medications as directed. Please refer to the Current Medication list given to you today.  Your physician recommends that you schedule a follow-up appointment in: 6-8 weeks with Dr. Klein.   

## 2013-12-10 NOTE — Progress Notes (Signed)
Patient Care Team: Lanice Shirts, MD as PCP - General (Internal Medicine) Phineas Inches, MD (Family Medicine)   HPI  Donna Ortega is a 80 y.o. 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  Has history of atrial fibrillation dating back 5 years for which amiod was tried along the way but was not tolerated  She is s/p CABG, has normal LV function  Because of an abnormal CHADS-VASc score she was started on Pradaxa subsequently stopped2/2 GI bleed.  She was put on aspirin instead. When she saw [facility] in March there was discussion about the use of apixaban as an alternative to aspirin. This never happened.  Her breathing has been stable. She's had more problems with swelling in her feet recently. She is increase her diuretic from every other day--daily without significant change.  She has some palpitations. She attributes it to her impatience.    Past Medical History  Diagnosis Date  . Coronary artery disease 2007    3 vessel CABG with LIMA-LAD, SVG to diag, and SVG - Ramus  . Hyperlipidemia   . Hypertension   . History of atrial fibrillation; review of all ECG are most consistent with atrial tachycardia     had rash with amiodarone; no anticoagulation due to GI bleed in December 2012  . History of hyperkalemia   . Hyponatremia   . Diverticulitis   . Palpitations   . History of GI bleed Dec 2012  . Hearing loss of left ear 07/2013  . GI bleeding 09/09/2013  . Arthritis     osteo   in back    Past Surgical History  Procedure Laterality Date  . Maze    . Coronary artery bypass graft  Feb 2007    Dr. Cyndia Bent;   Marland Kitchen Partial hysterectomy    . Esophagogastroduodenoscopy  08/04/2011    Procedure: ESOPHAGOGASTRODUODENOSCOPY (EGD);  Surgeon: Cleotis Nipper, MD;  Location: [facility] ENDOSCOPY;  Service: Endoscopy;  Laterality: N/A;  do at bedside  . Abdominal hysterectomy    . Psychologist, forensic    . Esophagogastroduodenoscopy N/A 09/11/2013     Procedure: ESOPHAGOGASTRODUODENOSCOPY (EGD);  Surgeon: Jeryl Columbia, MD;  Location: [facility] ENDOSCOPY;  Service: Endoscopy;  Laterality: N/A;  . Colonoscopy N/A 09/12/2013    Procedure: COLONOSCOPY;  Surgeon: Wonda Horner, MD;  Location: [facility] ENDOSCOPY;  Service: Endoscopy;  Laterality: N/A;    Current Outpatient Prescriptions  Medication Sig Dispense Refill  . alendronate (FOSAMAX) 70 MG tablet Take 1 tablet (70 mg total) by mouth every 7 (seven) days. Take with a full glass of water on an empty stomach.  4 tablet  2  . aspirin EC 81 MG tablet Take 81 mg by mouth once.       . Calcium Carbonate-Vitamin D (CALTRATE 600+D PO) Take 1 tablet by mouth daily.       Marland Kitchen diltiazem (CARDIZEM CD) 240 MG 24 hr capsule Take 1 capsule (240 mg total) by mouth daily.  30 capsule  5  . diphenhydrAMINE (BENADRYL) 25 MG tablet Take 25 mg by mouth every 6 (six) hours as needed for sleep.      Marland Kitchen lisinopril (PRINIVIL,ZESTRIL) 40 MG tablet Take 1 tablet (40 mg total) by mouth daily.  30 tablet  5  . metoprolol (LOPRESSOR) 100 MG tablet Take 1 tablet (100 mg total) by mouth 2 (two) times daily.  180 tablet  1  . Multiple Vitamins-Minerals (CENTRUM SILVER ADULT  50+ PO) Take 1 tablet by mouth daily.      . pantoprazole (PROTONIX) 40 MG tablet Take 1 tablet (40 mg total) by mouth daily.  60 tablet  2  . pravastatin (PRAVACHOL) 80 MG tablet Take 1 tablet (80 mg total) by mouth daily.  30 tablet  5  . triamterene-hydrochlorothiazide (MAXZIDE-25) 37.5-25 MG per tablet 1/2 tablet every other day.  15 tablet  5   No current facility-administered medications for this visit.    Allergies  Allergen Reactions  . Amiodarone Hives    ALL OVER BODY HIVES/ ITCH  . Aspirin     Gi bleed  . Lipitor [Atorvastatin Calcium]     MUSCLE ACHES  . Nsaids     GI bleed  . Niaspan [Niacin Er] Rash    Review of Systems negative except from HPI and PMH  Physical Exam BP 135/97  Pulse 121  Ht 5\' 2"  (1.575 m)  Wt 142 lb (64.411 kg)   BMI 25.97 kg/m2 Well developed and well nourished in no acute distress HENT normal E scleral and icterus clear Neck Supple JVP flat; carotids brisk and full Clear to ausculation Rapid but regular rate and rhythm, no murmurs gallops or rub Soft with active bowel sounds No clubbing cyanosis 1+ Edema Alert and oriented, grossly normal motor and sensory function Skin Warm and Dry    Assessment and  Plan  Sinus node dysfunction/chronotropic incompetence  Atrial tachycardia  Hypertension well controlled  Atrial fibrillation  GI bleeding secondary to diverticulosis  Pacemaker-Medtronic  The patient's device was interrogated.  The information was reviewed. No changes were made in the programming.     She has some tachypalpitations but does not   associated particularly with any degree of exercise intolerance. It comprises about 10% of her heart beats. She did not take her medication this morning. For right now we'll continue her on her current doses of beta blockers and calcium blockers. I should note that she was intolerant previously on amiodarone.  We also had a lengthy discussion regarding the use of anticoagulation. She will consider the use of apixaban.

## 2013-12-12 LAB — MDC_IDC_ENUM_SESS_TYPE_INCLINIC
Brady Statistic AP VP Percent: 0.48 %
Brady Statistic AS VP Percent: 3.51 %
Brady Statistic AS VS Percent: 13.85 %
Lead Channel Impedance Value: 440 Ohm
Lead Channel Sensing Intrinsic Amplitude: 1.8663
Lead Channel Setting Pacing Amplitude: 2 V
Lead Channel Setting Pacing Amplitude: 2.5 V
Lead Channel Setting Pacing Pulse Width: 0.4 ms
Lead Channel Setting Sensing Sensitivity: 0.9 mV
MDC IDC MSMT BATTERY VOLTAGE: 3 V
MDC IDC MSMT LEADCHNL RV IMPEDANCE VALUE: 440 Ohm
MDC IDC MSMT LEADCHNL RV SENSING INTR AMPL: 13.8752
MDC IDC SESS DTM: 20150428142936
MDC IDC SET ZONE DETECTION INTERVAL: 350 ms
MDC IDC SET ZONE DETECTION INTERVAL: 400 ms
MDC IDC STAT BRADY AP VS PERCENT: 82.16 %
MDC IDC STAT BRADY RA PERCENT PACED: 82.64 %
MDC IDC STAT BRADY RV PERCENT PACED: 3.99 %

## 2014-01-13 ENCOUNTER — Ambulatory Visit (INDEPENDENT_AMBULATORY_CARE_PROVIDER_SITE_OTHER): Payer: Medicare HMO | Admitting: Internal Medicine

## 2014-01-13 ENCOUNTER — Encounter: Payer: Self-pay | Admitting: Internal Medicine

## 2014-01-13 VITALS — BP 125/69 | HR 67 | Temp 98.1°F | Resp 18 | Ht 63.0 in | Wt 143.0 lb

## 2014-01-13 DIAGNOSIS — M810 Age-related osteoporosis without current pathological fracture: Secondary | ICD-10-CM

## 2014-01-13 DIAGNOSIS — I471 Supraventricular tachycardia: Secondary | ICD-10-CM

## 2014-01-13 DIAGNOSIS — R609 Edema, unspecified: Secondary | ICD-10-CM

## 2014-01-13 DIAGNOSIS — K922 Gastrointestinal hemorrhage, unspecified: Secondary | ICD-10-CM

## 2014-01-13 DIAGNOSIS — I498 Other specified cardiac arrhythmias: Secondary | ICD-10-CM

## 2014-01-13 MED ORDER — TRIAMTERENE-HCTZ 37.5-25 MG PO TABS: ORAL_TABLET | ORAL | Status: DC

## 2014-01-13 NOTE — Patient Instructions (Signed)
See me in 2 months    30 mins

## 2014-01-13 NOTE — Progress Notes (Signed)
Subjective:    Patient ID: Donna Ortega, female    DOB: 29-Jul-1926, 80 y.o.   MRN: [id number]  HPI Jakara is here for follow up pf several issues  See recent cardiology visit.  Pt was advised to consider Eliquis for anticoagulation - she is hesitant to start due to GI bleed (she was using ASA, Pradaxa and lots of NSAIDS) and cost.  She has been cleared to use ASA but she knows to avoid any NSAID use  She notes bilateral LE edema .  She has been out of her Maxide.    She does have minimal hyponatremia on her diuretic NO SOb no chest pain  She is using Tylenol pm for sleep   Tolerating Fosamax fine.  No GI irritation     Allergies  Allergen Reactions  . Amiodarone Hives    ALL OVER BODY HIVES/ ITCH  . Aspirin     Gi bleed  . Lipitor [Atorvastatin Calcium]     MUSCLE ACHES  . Nsaids     GI bleed  . Niaspan [Niacin Er] Rash   Past Medical History  Diagnosis Date  . Coronary artery disease 2007    3 vessel CABG with LIMA-LAD, SVG to diag, and SVG - Ramus  . Hyperlipidemia   . Hypertension   . History of atrial fibrillation; review of all ECG are most consistent with atrial tachycardia     had rash with amiodarone; no anticoagulation due to GI bleed in December 2012  . History of hyperkalemia   . Hyponatremia   . Diverticulitis   . Palpitations   . History of GI bleed Dec 2012  . Hearing loss of left ear 07/2013  . GI bleeding 09/09/2013  . Arthritis     osteo   in back  . Pacemaker-Medtronic 02/09/2012   Past Surgical History  Procedure Laterality Date  . Maze    . Coronary artery bypass graft  Feb 2007    Dr. Cyndia Bent;   Marland Kitchen Partial hysterectomy    . Esophagogastroduodenoscopy  08/04/2011    Procedure: ESOPHAGOGASTRODUODENOSCOPY (EGD);  Surgeon: Cleotis Nipper, MD;  Location: [facility] ENDOSCOPY;  Service: Endoscopy;  Laterality: N/A;  do at bedside  . Abdominal hysterectomy    . Psychologist, forensic    . Esophagogastroduodenoscopy N/A 09/11/2013    Procedure:  ESOPHAGOGASTRODUODENOSCOPY (EGD);  Surgeon: Jeryl Columbia, MD;  Location: [facility] ENDOSCOPY;  Service: Endoscopy;  Laterality: N/A;  . Colonoscopy N/A 09/12/2013    Procedure: COLONOSCOPY;  Surgeon: Wonda Horner, MD;  Location: [facility] ENDOSCOPY;  Service: Endoscopy;  Laterality: N/A;   History   Social History  . Marital Status: Widowed    Spouse Name: N/A    Number of Children: N/A  . Years of Education: N/A   Occupational History  . Not on file.   Social History Main Topics  . Smoking status: Never Smoker   . Smokeless tobacco: Never Used  . Alcohol Use: No  . Drug Use: No  . Sexual Activity: No   Other Topics Concern  . Not on file   Social History Narrative   Ambulatory usually independently, plays golf   Lives at home with family   Daughter in law Riah Kehoe [phone]   Family History  Problem Relation Age of Onset  . Heart failure Father   . Kidney failure Father   . Bone cancer Brother   . Kidney failure Sister   . Colon cancer Sister   . Cancer Sister  Patient Active Problem List   Diagnosis Date Noted  . Vertebral compression fracture 10/29/2013  . Gastric erosions 10/09/2013  . Decreased hearing of both ears 10/09/2013  . Anemia 06/26/2013  . Osteoporosis 06/18/2013  . Osteopenia 05/05/2013  . Palpitation 02/19/2013  . Insomnia 02/18/2013  . Candida esophagitis 01/22/2013  . Gastric ulcer 01/22/2013  . UTI (urinary tract infection) 01/17/2013  . Atrial tachycardia 05/22/2012  . Pacemaker-Medtronic 02/09/2012  . Sinus node dysfunction 02/06/2012  . Syncope and collapse 02/06/2012  . Rash 09/19/2011  . UTI (lower urinary tract infection) 08/21/2011  . Fatigue 08/21/2011  . Peptic ulcer disease with hemorrhage 08/14/2011  . S/P vertebroplasty 08/14/2011  . Chronic kidney disease (CKD), stage III (moderate) 08/05/2011  . Anemia associated with acute blood loss 08/04/2011  . GI bleed 08/04/2011  . Dyslipidemia 08/04/2011  . Melena 08/03/2011  . CAD  (coronary artery disease) s/p CABG 01/11/2011  . Atrial fibrillation/flutter 01/11/2011  . HTN (hypertension) 01/11/2011   Current Outpatient Prescriptions on File Prior to Visit  Medication Sig Dispense Refill  . alendronate (FOSAMAX) 70 MG tablet Take 1 tablet (70 mg total) by mouth every 7 (seven) days. Take with a full glass of water on an empty stomach.  4 tablet  2  . aspirin EC 81 MG tablet Take 81 mg by mouth once.       . Calcium Carbonate-Vitamin D (CALTRATE 600+D PO) Take 1 tablet by mouth daily.       Marland Kitchen diltiazem (CARDIZEM CD) 240 MG 24 hr capsule Take 1 capsule (240 mg total) by mouth daily.  30 capsule  5  . diphenhydrAMINE (BENADRYL) 25 MG tablet Take 25 mg by mouth every 6 (six) hours as needed for sleep.      Marland Kitchen lisinopril (PRINIVIL,ZESTRIL) 40 MG tablet Take 1 tablet (40 mg total) by mouth daily.  30 tablet  5  . metoprolol (LOPRESSOR) 100 MG tablet Take 1 tablet (100 mg total) by mouth 2 (two) times daily.  180 tablet  1  . Multiple Vitamins-Minerals (CENTRUM SILVER ADULT 50+ PO) Take 1 tablet by mouth daily.      . pantoprazole (PROTONIX) 40 MG tablet Take 1 tablet (40 mg total) by mouth daily.  60 tablet  2  . pravastatin (PRAVACHOL) 80 MG tablet Take 1 tablet (80 mg total) by mouth daily.  30 tablet  5  . triamterene-hydrochlorothiazide (MAXZIDE-25) 37.5-25 MG per tablet 1/2 tablet every other day.  15 tablet  5   No current facility-administered medications on file prior to visit.       Review of Systems See HPI    Objective:   Physical Exam   Physical Exam  Nursing note and vitals reviewed.  Constitutional: She is oriented to person, place, and time. She appears well-developed and well-nourished.  HENT:  Head: Normocephalic and atraumatic.  Cardiovascular: Normal rate and regular rhythm. Exam reveals no gallop and no friction rub.  No murmur heard.  Pulmonary/Chest: Breath sounds normal. She has no wheezes. She has no rales.  Neurological: She is alert  and oriented to person, place, and time.  Skin: Skin is warm and dry.  Ext  2+ edema of feet Psychiatric: She has a normal mood and affect. Her behavior is normal.            Assessment & Plan:  LE edema    Ok for Maxide one daily  We will have to watch her Na  -  See me in 6-8 week  Gastric ulcer  She knows to avoid all NSAIDS  Osteoporosis/vertebral compression FX  On Fosamax once a week  Tachy/Brady syndrome  She is taking baby ASA and considering furhter anticoagulation with Eliquis  See mein 2 months   Will check CMP then

## 2014-01-21 ENCOUNTER — Encounter: Payer: Self-pay | Admitting: Internal Medicine

## 2014-01-21 ENCOUNTER — Ambulatory Visit (INDEPENDENT_AMBULATORY_CARE_PROVIDER_SITE_OTHER): Payer: Medicare HMO | Admitting: Internal Medicine

## 2014-01-21 VITALS — BP 93/63 | HR 109 | Ht 69.0 in | Wt 143.0 lb

## 2014-01-21 DIAGNOSIS — Z950 Presence of cardiac pacemaker: Secondary | ICD-10-CM

## 2014-01-21 DIAGNOSIS — I495 Sick sinus syndrome: Secondary | ICD-10-CM

## 2014-01-21 DIAGNOSIS — I4891 Unspecified atrial fibrillation: Secondary | ICD-10-CM

## 2014-01-21 LAB — MDC_IDC_ENUM_SESS_TYPE_INCLINIC
Brady Statistic AP VP Percent: 0.75 %
Brady Statistic AP VS Percent: 67.6 %
Brady Statistic AS VP Percent: 2.58 %
Brady Statistic AS VS Percent: 29.07 %
Brady Statistic RV Percent Paced: 3.33 %
Date Time Interrogation Session: 20150609151606
Lead Channel Impedance Value: 416 Ohm
Lead Channel Impedance Value: 432 Ohm
Lead Channel Pacing Threshold Pulse Width: 0.4 ms
Lead Channel Sensing Intrinsic Amplitude: 1.8 mV
Lead Channel Sensing Intrinsic Amplitude: 11.1 mV
Lead Channel Setting Pacing Amplitude: 2 V
Lead Channel Setting Pacing Amplitude: 2.5 V
Lead Channel Setting Pacing Pulse Width: 0.4 ms
MDC IDC MSMT BATTERY VOLTAGE: 3 V
MDC IDC MSMT LEADCHNL RA PACING THRESHOLD AMPLITUDE: 1 V
MDC IDC MSMT LEADCHNL RV PACING THRESHOLD AMPLITUDE: 1 V
MDC IDC MSMT LEADCHNL RV PACING THRESHOLD PULSEWIDTH: 0.4 ms
MDC IDC SET LEADCHNL RV SENSING SENSITIVITY: 0.9 mV
MDC IDC SET ZONE DETECTION INTERVAL: 350 ms
MDC IDC SET ZONE DETECTION INTERVAL: 400 ms
MDC IDC STAT BRADY RA PERCENT PACED: 68.36 %

## 2014-01-21 MED ORDER — LISINOPRIL 20 MG PO TABS: 20.0000 mg | ORAL_TABLET | Freq: Every day | ORAL | Status: DC

## 2014-01-21 NOTE — Patient Instructions (Signed)
Your physician has recommended you make the following change in your medication:  1) DECREASE Lisinopril to 20 mg daily  Your physician has requested that you have an echocardiogram. Echocardiography is a painless test that uses sound waves to create images of your heart. It provides your doctor with information about the size and shape of your heart and how well your heart's chambers and valves are working. This procedure takes approximately one hour. There are no restrictions for this procedure.  Your physician recommends that you schedule a follow-up appointment in: 3 months with Dr. Acie Fredrickson.  Remote monitoring is used to monitor your Pacemaker of ICD from home. This monitoring reduces the number of office visits required to check your device to one time per year. It allows [place] to keep an eye on the functioning of your device to ensure it is working properly. You are scheduled for a device check from home on 04/24/14. You may send your transmission at any time that day. If you have a wireless device, the transmission will be sent automatically. After your physician reviews your transmission, you will receive a postcard with your next transmission date.  Your physician wants you to follow-up in: 1 year with Dr. Caryl Comes.  You will receive a reminder letter in the mail two months in advance. If you don't receive a letter, please call our office to schedule the follow-up appointment.

## 2014-01-21 NOTE — Progress Notes (Signed)
Patient Care Team: Lanice Shirts, MD as PCP - General (Internal Medicine) Phineas Inches, MD (Family Medicine)   HPI  Donna Ortega is a 80 y.o. 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  Has history of atrial fibrillation dating back 5 years for which amiod was tried along the way but was not tolerated   She is s/p CABG, has normal LV function  Because of an abnormal CHADS-VASc score she was started on Pradaxa subsequently stopped2/2 GI bleed.  She was put on aspirin instead. When she saw [facility] in March there was discussion about the use of apixaban as an alternative to aspirin. This never happened.  She is also having problems with palpitations.    Past Medical History  Diagnosis Date  . Coronary artery disease 2007    3 vessel CABG with LIMA-LAD, SVG to diag, and SVG - Ramus  . Hyperlipidemia   . Hypertension   . History of atrial fibrillation; review of all ECG are most consistent with atrial tachycardia     had rash with amiodarone; no anticoagulation due to GI bleed in December 2012  . History of hyperkalemia   . Hyponatremia   . Diverticulitis   . Palpitations   . History of GI bleed Dec 2012  . Hearing loss of left ear 07/2013  . GI bleeding 09/09/2013  . Arthritis     osteo   in back  . Pacemaker-Medtronic 02/09/2012    Past Surgical History  Procedure Laterality Date  . Maze    . Coronary artery bypass graft  Feb 2007    Dr. Cyndia Bent;   Marland Kitchen Partial hysterectomy    . Esophagogastroduodenoscopy  08/04/2011    Procedure: ESOPHAGOGASTRODUODENOSCOPY (EGD);  Surgeon: Cleotis Nipper, MD;  Location: [facility] ENDOSCOPY;  Service: Endoscopy;  Laterality: N/A;  do at bedside  . Abdominal hysterectomy    . Psychologist, forensic    . Esophagogastroduodenoscopy N/A 09/11/2013    Procedure: ESOPHAGOGASTRODUODENOSCOPY (EGD);  Surgeon: Jeryl Columbia, MD;  Location: [facility] ENDOSCOPY;  Service: Endoscopy;  Laterality: N/A;  . Colonoscopy  N/A 09/12/2013    Procedure: COLONOSCOPY;  Surgeon: Wonda Horner, MD;  Location: [facility] ENDOSCOPY;  Service: Endoscopy;  Laterality: N/A;    Current Outpatient Prescriptions  Medication Sig Dispense Refill  . alendronate (FOSAMAX) 70 MG tablet Take 1 tablet (70 mg total) by mouth every 7 (seven) days. Take with a full glass of water on an empty stomach.  4 tablet  2  . aspirin EC 81 MG tablet Take 81 mg by mouth once.       . Calcium Carbonate-Vitamin D (CALTRATE 600+D PO) Take 1 tablet by mouth daily.       Marland Kitchen diltiazem (CARDIZEM CD) 240 MG 24 hr capsule Take 1 capsule (240 mg total) by mouth daily.  30 capsule  5  . diphenhydrAMINE (BENADRYL) 25 MG tablet Take 25 mg by mouth every 6 (six) hours as needed for sleep.      Marland Kitchen lisinopril (PRINIVIL,ZESTRIL) 40 MG tablet Take 1 tablet (40 mg total) by mouth daily.  30 tablet  5  . metoprolol (LOPRESSOR) 100 MG tablet Take 1 tablet (100 mg total) by mouth 2 (two) times daily.  180 tablet  1  . Multiple Vitamins-Minerals (CENTRUM SILVER ADULT 50+ PO) Take 1 tablet by mouth daily.      . pantoprazole (PROTONIX) 40 MG tablet Take 1 tablet (40 mg total) by  mouth daily.  60 tablet  2  . pravastatin (PRAVACHOL) 80 MG tablet Take 1 tablet (80 mg total) by mouth daily.  30 tablet  5  . triamterene-hydrochlorothiazide (MAXZIDE-25) 37.5-25 MG per tablet Take one whole tablet daily  30 tablet  2   No current facility-administered medications for this visit.    Allergies  Allergen Reactions  . Amiodarone Hives    ALL OVER BODY HIVES/ ITCH  . Aspirin     Gi bleed  . Lipitor [Atorvastatin Calcium]     MUSCLE ACHES  . Nsaids     GI bleed  . Niaspan [Niacin Er] Rash    Review of Systems negative except from HPI and PMH  Physical Exam There were no vitals taken for this visit. Well developed and well nourished in no acute distress HENT normal E scleral and icterus clear Neck Supple JVP flat; carotids brisk and full Clear to ausculation Rapid but  regular rate and rhythm, no murmurs gallops or rub Soft with active bowel sounds No clubbing cyanosis 1+ Edema Alert and oriented, grossly normal motor and sensory function Skin Warm and Dry  ECG demonstrates atrial tachycardia at 109. Intervals 30/08/36  Assessment and  Plan  Sinus node dysfunction/chronotropic incompetence  Atrial tachycardia  Hypertension    Atrial fibrillation  GI bleeding secondary to diverticulosis  Pacemaker-Medtronic  The patient's device was interrogated.  The information was reviewed. No changes were made in the programming.     She continues to have periodic palpitations albeit not nearly to the degree that she has tachycardia. They're not particularly problematic; hence, in the absence of deleterious impact on her LV function, we would not intervene further. However, if we see evidence of LV dysfunction, I would probably recommend AV junction ablation as her blood pressure is also been significantly attenuated by her AV nodal blocking agents   We will decrease her lisinopril from 40--20 mg daily.

## 2014-01-22 ENCOUNTER — Other Ambulatory Visit: Payer: Self-pay | Admitting: Internal Medicine

## 2014-01-29 ENCOUNTER — Other Ambulatory Visit: Payer: Self-pay | Admitting: *Deleted

## 2014-01-29 NOTE — Telephone Encounter (Signed)
Pt needs refill of Alendronate

## 2014-01-30 ENCOUNTER — Telehealth: Payer: Self-pay | Admitting: *Deleted

## 2014-01-30 DIAGNOSIS — K259 Gastric ulcer, unspecified as acute or chronic, without hemorrhage or perforation: Secondary | ICD-10-CM

## 2014-01-30 MED ORDER — ALENDRONATE SODIUM 70 MG PO TABS: 70.0000 mg | ORAL_TABLET | ORAL | Status: DC

## 2014-01-30 MED ORDER — PANTOPRAZOLE SODIUM 40 MG PO TBEC: 40.0000 mg | DELAYED_RELEASE_TABLET | Freq: Every day | ORAL | Status: DC

## 2014-01-30 NOTE — Telephone Encounter (Signed)
Sent refill of Protonix to pharm as response to incoming fax

## 2014-02-17 ENCOUNTER — Other Ambulatory Visit (HOSPITAL_COMMUNITY): Payer: Medicare HMO

## 2014-02-24 ENCOUNTER — Other Ambulatory Visit: Payer: Self-pay | Admitting: Internal Medicine

## 2014-02-24 NOTE — Telephone Encounter (Signed)
Medication name:  Name from pharmacy:  metoprolol (LOPRESSOR) 100 MG tablet  METOPROLOL TARTRATE 100MG  TAB Sig: TAKE 1 TABLET (100 MG TOTAL) BY MOUTH TWO TIMES DAILY. Dispense: Not specified (Pharmacy requested 180 capsule) Start: 02/24/2014 Class: Normal Requested on: 10/31/2013 Originally ordered on: 02/19/2013 Last refill: 02/05/2014 Order History and Details

## 2014-02-28 ENCOUNTER — Ambulatory Visit (HOSPITAL_COMMUNITY): Payer: Medicare HMO | Attending: Internal Medicine | Admitting: Cardiology

## 2014-02-28 DIAGNOSIS — I4891 Unspecified atrial fibrillation: Secondary | ICD-10-CM | POA: Insufficient documentation

## 2014-02-28 DIAGNOSIS — I10 Essential (primary) hypertension: Secondary | ICD-10-CM | POA: Insufficient documentation

## 2014-02-28 DIAGNOSIS — I079 Rheumatic tricuspid valve disease, unspecified: Secondary | ICD-10-CM | POA: Insufficient documentation

## 2014-02-28 DIAGNOSIS — I495 Sick sinus syndrome: Secondary | ICD-10-CM

## 2014-02-28 DIAGNOSIS — E785 Hyperlipidemia, unspecified: Secondary | ICD-10-CM | POA: Insufficient documentation

## 2014-02-28 DIAGNOSIS — I517 Cardiomegaly: Secondary | ICD-10-CM | POA: Insufficient documentation

## 2014-02-28 DIAGNOSIS — I059 Rheumatic mitral valve disease, unspecified: Secondary | ICD-10-CM | POA: Insufficient documentation

## 2014-02-28 DIAGNOSIS — I359 Nonrheumatic aortic valve disorder, unspecified: Secondary | ICD-10-CM | POA: Insufficient documentation

## 2014-02-28 DIAGNOSIS — I2510 Atherosclerotic heart disease of native coronary artery without angina pectoris: Secondary | ICD-10-CM | POA: Insufficient documentation

## 2014-02-28 NOTE — Progress Notes (Signed)
Echo performed. 

## 2014-03-07 ENCOUNTER — Other Ambulatory Visit: Payer: Self-pay

## 2014-03-07 MED ORDER — LISINOPRIL 20 MG PO TABS: 20.0000 mg | ORAL_TABLET | Freq: Every day | ORAL | Status: DC

## 2014-03-24 ENCOUNTER — Other Ambulatory Visit: Payer: Self-pay | Admitting: Internal Medicine

## 2014-03-25 ENCOUNTER — Telehealth: Payer: Self-pay | Admitting: *Deleted

## 2014-03-25 ENCOUNTER — Other Ambulatory Visit: Payer: Self-pay | Admitting: *Deleted

## 2014-03-25 NOTE — Telephone Encounter (Signed)
Refill request for MAXIDE

## 2014-03-25 NOTE — Telephone Encounter (Signed)
Received refill request from pharm

## 2014-03-25 NOTE — Telephone Encounter (Signed)
Donna Ortega send this as a refill request

## 2014-03-25 NOTE — Telephone Encounter (Signed)
Donna Ortega    Pt needs to see me in office in next 30 days.  I refilled 30 days of her diuretic    Schedule a 30 min office visit with me   -route back with date    thanks

## 2014-03-25 NOTE — Telephone Encounter (Signed)
Requested Medications     Medication name:  Name from pharmacy:  triamterene-hydrochlorothiazide ([id number]) 37.5-25 MG per tablet  TRIAMTERENE/HCTZ 37.5-73M TAB    Sig: TAKE ONE WHOLE TABLET BY MOUTH DAILY    Dispense: Not specified (Pharmacy requested 30 capsule) Start: 03/24/2014  Class: Normal    Requested on: 01/13/2014    Originally ordered on: 05/22/2012 Last refill: 03/11/2014 Order History and Details       Sorry if this is a duplicate

## 2014-03-25 NOTE — Telephone Encounter (Signed)
Requested Medications     Medication name:  Name from pharmacy:  triamterene-hydrochlorothiazide ([id number]) 37.5-25 MG per tablet  TRIAMTERENE/HCTZ 37.5-27M TAB    Sig: TAKE ONE WHOLE TABLET BY MOUTH DAILY    Dispense: Not specified (Pharmacy requested 30 capsule) Start: 03/24/2014  Class: Normal    Requested on: 01/13/2014    Originally ordered on: 05/22/2012 Last refill: 03/11/2014 Order History and Details

## 2014-03-27 NOTE — Telephone Encounter (Signed)
Spoke w/ pt & informed her refill request  For b/p medication was denied & would be filled at next office visit 8/26.Pt was fine w/ the information.

## 2014-04-09 ENCOUNTER — Encounter: Payer: Self-pay | Admitting: Internal Medicine

## 2014-04-09 ENCOUNTER — Ambulatory Visit (INDEPENDENT_AMBULATORY_CARE_PROVIDER_SITE_OTHER): Payer: Medicare HMO | Admitting: Internal Medicine

## 2014-04-09 VITALS — BP 118/66 | HR 79 | Resp 16 | Ht 63.0 in | Wt 136.0 lb

## 2014-04-09 DIAGNOSIS — R079 Chest pain, unspecified: Secondary | ICD-10-CM

## 2014-04-09 DIAGNOSIS — M810 Age-related osteoporosis without current pathological fracture: Secondary | ICD-10-CM

## 2014-04-09 DIAGNOSIS — K922 Gastrointestinal hemorrhage, unspecified: Secondary | ICD-10-CM

## 2014-04-09 DIAGNOSIS — R609 Edema, unspecified: Secondary | ICD-10-CM | POA: Insufficient documentation

## 2014-04-09 DIAGNOSIS — E871 Hypo-osmolality and hyponatremia: Secondary | ICD-10-CM

## 2014-04-09 DIAGNOSIS — Z139 Encounter for screening, unspecified: Secondary | ICD-10-CM

## 2014-04-09 DIAGNOSIS — D649 Anemia, unspecified: Secondary | ICD-10-CM

## 2014-04-09 DIAGNOSIS — R000 Tachycardia, unspecified: Secondary | ICD-10-CM

## 2014-04-09 LAB — COMPREHENSIVE METABOLIC PANEL
ALT: 11 U/L (ref 0–35)
AST: 17 U/L (ref 0–37)
Albumin: 4.8 g/dL (ref 3.5–5.2)
Alkaline Phosphatase: 49 U/L (ref 39–117)
BILIRUBIN TOTAL: 0.6 mg/dL (ref 0.2–1.2)
BUN: 26 mg/dL — ABNORMAL HIGH (ref 6–23)
CO2: 23 mEq/L (ref 19–32)
CREATININE: 1.14 mg/dL — AB (ref 0.50–1.10)
Calcium: 9.2 mg/dL (ref 8.4–10.5)
Chloride: 102 mEq/L (ref 96–112)
GLUCOSE: 88 mg/dL (ref 70–99)
Potassium: 4.3 mEq/L (ref 3.5–5.3)
Sodium: 133 mEq/L — ABNORMAL LOW (ref 135–145)
Total Protein: 7.2 g/dL (ref 6.0–8.3)

## 2014-04-09 LAB — CBC WITH DIFFERENTIAL/PLATELET
BASOS ABS: 0 10*3/uL (ref 0.0–0.1)
Basophils Relative: 0 % (ref 0–1)
Eosinophils Absolute: 0.1 10*3/uL (ref 0.0–0.7)
Eosinophils Relative: 1 % (ref 0–5)
HEMATOCRIT: 34.7 % — AB (ref 36.0–46.0)
HEMOGLOBIN: 12.4 g/dL (ref 12.0–15.0)
LYMPHS PCT: 22 % (ref 12–46)
Lymphs Abs: 1.2 10*3/uL (ref 0.7–4.0)
MCH: 31.9 pg (ref 26.0–34.0)
MCHC: 35.7 g/dL (ref 30.0–36.0)
MCV: 89.2 fL (ref 78.0–100.0)
Monocytes Absolute: 0.5 10*3/uL (ref 0.1–1.0)
Monocytes Relative: 9 % (ref 3–12)
NEUTROS ABS: 3.7 10*3/uL (ref 1.7–7.7)
Neutrophils Relative %: 68 % (ref 43–77)
Platelets: 196 10*3/uL (ref 150–400)
RBC: 3.89 MIL/uL (ref 3.87–5.11)
RDW: 13.8 % (ref 11.5–15.5)
WBC: 5.5 10*3/uL (ref 4.0–10.5)

## 2014-04-09 LAB — AMYLASE: Amylase: 26 U/L (ref 0–105)

## 2014-04-09 NOTE — Progress Notes (Signed)
Subjective:    Patient ID: Donna Ortega, female    DOB: Nov 11, 1925, 80 y.o.   MRN: [id number]  HPI Donna Ortega is here for follow up of several issues. She tells me she recently went to  [facility] and was happy to see Johny Sax  See CXR  Follow up Ct reports no mass  She is on maxide for worsening edema.  She has lost 6 lbs since June.   Pt has had a low Na in the past on HCTZ .  Right now she is taking diuretic daily   Osteoporosis/ vertebral comression fx treated with kyphoplasty.  She is tolerating Fosamax..  Pt had a 5 year absence from any bisphosphanate   Tachy/brady syndrome  There was some discussion with her cardiologist about starting Eliquis but pt declined this  She report 2 months ago she lifted a heavy carton and had pain in back and ribs.  She did see her orthopedic MD who did xrays and was told it was all muscles strain .  She is improving but still is sore aournd anterior rib area.  I advised rib films but pt declines   Allergies  Allergen Reactions  . Amiodarone Hives    ALL OVER BODY HIVES/ ITCH  . Aspirin     Gi bleed  . Lipitor [Atorvastatin Calcium]     MUSCLE ACHES  . Nsaids     GI bleed  . Niaspan [Niacin Er] Rash   Past Medical History  Diagnosis Date  . Coronary artery disease 2007    3 vessel CABG with LIMA-LAD, SVG to diag, and SVG - Ramus  . Hyperlipidemia   . Hypertension   . History of atrial fibrillation; review of all ECG are most consistent with atrial tachycardia     had rash with amiodarone; no anticoagulation due to GI bleed in December 2012  . History of hyperkalemia   . Hyponatremia   . Diverticulitis   . Palpitations   . History of GI bleed Dec 2012  . Hearing loss of left ear 07/2013  . GI bleeding 09/09/2013  . Arthritis     osteo   in back  . Pacemaker-Medtronic 02/09/2012   Past Surgical History  Procedure Laterality Date  . Maze    . Coronary artery bypass graft  Feb 2007    Dr. Cyndia Bent;   Marland Kitchen Partial hysterectomy    .  Esophagogastroduodenoscopy  08/04/2011    Procedure: ESOPHAGOGASTRODUODENOSCOPY (EGD);  Surgeon: Cleotis Nipper, MD;  Location: [facility] ENDOSCOPY;  Service: Endoscopy;  Laterality: N/A;  do at bedside  . Abdominal hysterectomy    . Psychologist, forensic    . Esophagogastroduodenoscopy N/A 09/11/2013    Procedure: ESOPHAGOGASTRODUODENOSCOPY (EGD);  Surgeon: Jeryl Columbia, MD;  Location: [facility] ENDOSCOPY;  Service: Endoscopy;  Laterality: N/A;  . Colonoscopy N/A 09/12/2013    Procedure: COLONOSCOPY;  Surgeon: Wonda Horner, MD;  Location: [facility] ENDOSCOPY;  Service: Endoscopy;  Laterality: N/A;   History   Social History  . Marital Status: Widowed    Spouse Name: N/A    Number of Children: N/A  . Years of Education: N/A   Occupational History  . Not on file.   Social History Main Topics  . Smoking status: Never Smoker   . Smokeless tobacco: Never Used  . Alcohol Use: No  . Drug Use: No  . Sexual Activity: No   Other Topics Concern  . Not on file   Social History Narrative   Ambulatory usually independently,  plays golf   Lives at home with family   Daughter in law Nikiyah Fackler [phone]   Family History  Problem Relation Age of Onset  . Heart failure Father   . Kidney failure Father   . Bone cancer Brother   . Kidney failure Sister   . Colon cancer Sister   . Cancer Sister    Patient Active Problem List   Diagnosis Date Noted  . Vertebral compression fracture 10/29/2013  . Gastric erosions 10/09/2013  . Decreased hearing of both ears 10/09/2013  . Anemia 06/26/2013  . Osteoporosis  Fosamax restarted 10/2013 06/18/2013  . Osteopenia 05/05/2013  . Palpitation 02/19/2013  . Insomnia 02/18/2013  . Candida esophagitis 01/22/2013  . Gastric ulcer 01/22/2013  . UTI (urinary tract infection) 01/17/2013  . Atrial tachycardia 05/22/2012  . Pacemaker-Medtronic 02/09/2012  . Sinus node dysfunction 02/06/2012  . Syncope and collapse 02/06/2012  . Rash 09/19/2011  . UTI (lower urinary tract  infection) 08/21/2011  . Fatigue 08/21/2011  . Peptic ulcer disease with hemorrhage 08/14/2011  . S/P vertebroplasty 08/14/2011  . Chronic kidney disease (CKD), stage III (moderate) 08/05/2011  . Anemia associated with acute blood loss 08/04/2011  . GI bleed 08/04/2011  . Dyslipidemia 08/04/2011  . Melena 08/03/2011  . CAD (coronary artery disease) s/p CABG 01/11/2011  . Atrial fibrillation/flutter 01/11/2011  . HTN (hypertension) 01/11/2011   Current Outpatient Prescriptions on File Prior to Visit  Medication Sig Dispense Refill  . alendronate (FOSAMAX) 70 MG tablet Take 1 tablet (70 mg total) by mouth every 7 (seven) days. Take with a full glass of water on an empty stomach.  4 tablet  5  . aspirin EC 81 MG tablet Take 81 mg by mouth once.       . Calcium Carbonate-Vitamin D (CALTRATE 600+D PO) Take 1 tablet by mouth daily.       Marland Kitchen diltiazem (CARDIZEM CD) 240 MG 24 hr capsule Take 1 capsule (240 mg total) by mouth daily.  30 capsule  5  . diphenhydrAMINE (BENADRYL) 25 MG tablet Take 25 mg by mouth every 6 (six) hours as needed for sleep.      Marland Kitchen lisinopril (PRINIVIL,ZESTRIL) 20 MG tablet Take 1 tablet (20 mg total) by mouth daily.  90 tablet  1  . metoprolol (LOPRESSOR) 100 MG tablet TAKE 1 TABLET (100 MG TOTAL) BY MOUTH TWO   TIMES DAILY.  180 tablet  1  . Multiple Vitamins-Minerals (CENTRUM SILVER ADULT 50+ PO) Take 1 tablet by mouth daily.      . pantoprazole (PROTONIX) 40 MG tablet Take 1 tablet (40 mg total) by mouth daily.  90 tablet  1  . pravastatin (PRAVACHOL) 80 MG tablet Take 1 tablet (80 mg total) by mouth daily.  30 tablet  5  . triamterene-hydrochlorothiazide (MAXZIDE-25) 37.5-25 MG per tablet TAKE ONE WHOLE TABLET BY MOUTH DAILY  30 tablet  0   No current facility-administered medications on file prior to visit.       Review of Systems See HPI    Objective:   Physical Exam  Physical Exam  Nursing note and vitals reviewed.  Constitutional: She is oriented to  person, place, and time. She appears well-developed and well-nourished.  HENT:  Head: Normocephalic and atraumatic.  Cardiovascular: Normal rate and regular rhythm. Exam reveals no gallop and no friction rub. Chest  She really is not tender to palpation of any ribs. No murmur heard.  Pulmonary/Chest: Breath sounds normal. She has no wheezes. She  has no rales.  Neurological: She is alert and oriented to person, place, and time.  Skin: Skin is warm and dry.  Psychiatric: She has a normal mood and affect. Her behavior is normal.             Assessment & Plan:  LE edema :  Will check chemisty today  .  May need to lower  Dose if NA low  Gi bleed/ anemia will recheck CBC todfay   Osteoporosis    Continue Fosamax  '  Tachy/brady syndrome  Followed by cardiology   R sided rib pain I advised CXR and rib xray  But pt declines   Schedule CPE  See me prn

## 2014-04-09 NOTE — Patient Instructions (Signed)
Schedule CPE with me   To lab today

## 2014-04-11 ENCOUNTER — Telehealth: Payer: Self-pay | Admitting: Internal Medicine

## 2014-04-11 NOTE — Telephone Encounter (Signed)
Left message on pts voicemail regarding labs and slightly low sodium  Advised to take her maxzide on M,W and Friday only not daily

## 2014-04-23 ENCOUNTER — Ambulatory Visit (INDEPENDENT_AMBULATORY_CARE_PROVIDER_SITE_OTHER): Payer: Medicare HMO | Admitting: Cardiovascular Disease

## 2014-04-23 ENCOUNTER — Encounter: Payer: Self-pay | Admitting: Cardiovascular Disease

## 2014-04-23 VITALS — BP 112/60 | HR 62 | Ht 64.0 in | Wt 137.6 lb

## 2014-04-23 DIAGNOSIS — I2510 Atherosclerotic heart disease of native coronary artery without angina pectoris: Secondary | ICD-10-CM

## 2014-04-23 NOTE — Patient Instructions (Signed)
Your physician recommends that you continue on your current medications as directed. Please refer to the Current Medication list given to you today.  Your physician wants you to follow-up in: 1 year with Dr. Nahser.  You will receive a reminder letter in the mail two months in advance. If you don't receive a letter, please call our office to schedule the follow-up appointment.  

## 2014-04-23 NOTE — Progress Notes (Signed)
I spoke with Donna Ortega and she is aware that she needs to decrease her HCTZ to MON, Wed, Friday-eh

## 2014-04-23 NOTE — Progress Notes (Signed)
Donna Ortega Date of Birth  1926-08-10 [place] [address]    [place]   [place], [place]  [address]    [place], [place]  [address] [phone]  Fax  [phone]  [phone]  Fax [phone]  Problems:  1. CAD - s/p CABG 2007, 2. PACs 3. Drug Rash - amiodarone 4. GI Bleed 5. Anemia 6. hyponatremia   History of Present Illness:  Donna Ortega is an 80 year old female with a history of coronary artery disease and atrial fibrillation. She was admitted to the hospital in December with a GI bleed, anemia, and rapid atrial fibrillation. She was discharged with the additional medications of amiodarone and Cardizem CD.  She was seen in mid January and was doing fairly well.  She stopped her Amiodarone due to drug rash.  She still has generalized fatigue and generalized weakness. She wonders if it may be due to the Lasix and potassium.  April 17, 2012 She has been more fatigued recently.  She played golf this past weekend and still is not over the exertion.  Dec. 3, 2013 :  She is doing well.  Dr. Caryl Comes stopped her Pradaxa due to GI bleed.    She is doing very well.   January 15, 2013:  Donna Ortega is doing OK.  She does not have as much energy as she would like.   She has continued to have problems with hyponatremia.  She is on maxzide which is likely exacerbating this.  No CP, no dyspnea.    Dec. 1, 2014:  Donna Ortega is doing well.  No CP or dyspnea.  Rhythm is stable.   Her Hb was a bit low when last checked by her medical doctor.  Has not started on the iron tabs.   No CP, not playing golf any more because of back pain ( s/p kyphoplasty)  .  She is still walking around the block on occasion.   She is now in her own apartment.   Sept. 9, 2015:  Donna Ortega is doing well.  She is not having any problems   Current Outpatient Prescriptions on File Prior to Visit  Medication Sig Dispense Refill  . alendronate (FOSAMAX) 70 MG tablet Take 1 tablet  (70 mg total) by mouth every 7 (seven) days. Take with a full glass of water on an empty stomach.  4 tablet  5  . aspirin EC 81 MG tablet Take 81 mg by mouth once.       . Calcium Carbonate-Vitamin D (CALTRATE 600+D PO) Take 1 tablet by mouth daily.       Marland Kitchen diltiazem (CARDIZEM CD) 240 MG 24 hr capsule Take 1 capsule (240 mg total) by mouth daily.  30 capsule  5  . lisinopril (PRINIVIL,ZESTRIL) 20 MG tablet Take 1 tablet (20 mg total) by mouth daily.  90 tablet  1  . metoprolol (LOPRESSOR) 100 MG tablet TAKE 1 TABLET (100 MG TOTAL) BY MOUTH TWO   TIMES DAILY.  180 tablet  1  . Multiple Vitamins-Minerals (CENTRUM SILVER ADULT 50+ PO) Take 1 tablet by mouth daily.      . pravastatin (PRAVACHOL) 80 MG tablet Take 1 tablet (80 mg total) by mouth daily.  30 tablet  5   No current facility-administered medications on file prior to visit.    Allergies  Allergen Reactions  . Amiodarone Hives    ALL OVER BODY HIVES/ ITCH  . Aspirin  Gi bleed  . Lipitor [Atorvastatin Calcium]     MUSCLE ACHES  . Nsaids     GI bleed  . Niaspan [Niacin Er] Rash    Past Medical History  Diagnosis Date  . Coronary artery disease 2007    3 vessel CABG with LIMA-LAD, SVG to diag, and SVG - Ramus  . Hyperlipidemia   . Hypertension   . History of atrial fibrillation; review of all ECG are most consistent with atrial tachycardia     had rash with amiodarone; no anticoagulation due to GI bleed in December 2012  . History of hyperkalemia   . Hyponatremia   . Diverticulitis   . Palpitations   . History of GI bleed Dec 2012  . Hearing loss of left ear 07/2013  . GI bleeding 09/09/2013  . Arthritis     osteo   in back  . Pacemaker-Medtronic 02/09/2012    Past Surgical History  Procedure Laterality Date  . Maze    . Coronary artery bypass graft  Feb 2007    Dr. Cyndia Bent;   Marland Kitchen Partial hysterectomy    . Esophagogastroduodenoscopy  08/04/2011    Procedure: ESOPHAGOGASTRODUODENOSCOPY (EGD);  Surgeon: Cleotis Nipper, MD;  Location: [facility] ENDOSCOPY;  Service: Endoscopy;  Laterality: N/A;  do at bedside  . Abdominal hysterectomy    . Psychologist, forensic    . Esophagogastroduodenoscopy N/A 09/11/2013    Procedure: ESOPHAGOGASTRODUODENOSCOPY (EGD);  Surgeon: Jeryl Columbia, MD;  Location: [facility] ENDOSCOPY;  Service: Endoscopy;  Laterality: N/A;  . Colonoscopy N/A 09/12/2013    Procedure: COLONOSCOPY;  Surgeon: Wonda Horner, MD;  Location: [facility] ENDOSCOPY;  Service: Endoscopy;  Laterality: N/A;    History  Smoking status  . Never Smoker   Smokeless tobacco  . Never Used    History  Alcohol Use No    Family History  Problem Relation Age of Onset  . Heart failure Father   . Kidney failure Father   . Bone cancer Brother   . Kidney failure Sister   . Colon cancer Sister   . Cancer Sister     Reviw of Systems:  Reviewed in the HPI.  All other systems are negative.  Physical Exam: Blood pressure 112/60, pulse 62, height 5\' 4"  (1.626 m), weight 137 lb 9.6 oz (62.415 kg). General: Well developed, well nourished, in no acute distress.  Head: Normocephalic, atraumatic, sclera non-icteric, mucus membranes are moist,   Neck: Supple. Negative for carotid bruits. JVD not elevated.  Lungs: Clear bilaterally to auscultation without wheezes, rales, or rhonchi. Breathing is unlabored.  Heart: RR with occasional premature beats  with normal S1 S2.    Abdomen: Soft, non-tender, non-distended with normoactive bowel sounds. No hepatomegaly. No rebound/guarding. No obvious abdominal masses.  Msk:  Strength and tone appear normal for age.  Extremities: No clubbing or cyanosis. No edema.  Distal pedal pulses are 2+ and equal bilaterally.  Neuro: Alert and oriented X 3. Moves all extremities spontaneously.  Psych:  Responds to questions appropriately with a normal affect.   ECG: Dec. 1, 2014:  NSR at 1st degree AVB.  NS ST/ T abn.   Assessment / Plan:

## 2014-04-23 NOTE — Assessment & Plan Note (Signed)
She is doing well.  No angina. She lives alone.    She complains of generalized lack of energy but is overall doing ok.    She is on max dose pravachol and her last lipids were ok / a bit elevated.  Will check lipids/ liver / BMP in 1 year.   I will see her in 1 year.

## 2014-04-24 ENCOUNTER — Telehealth: Payer: Self-pay | Admitting: Cardiology

## 2014-04-24 ENCOUNTER — Ambulatory Visit (INDEPENDENT_AMBULATORY_CARE_PROVIDER_SITE_OTHER): Payer: Medicare HMO | Admitting: *Deleted

## 2014-04-24 DIAGNOSIS — I495 Sick sinus syndrome: Secondary | ICD-10-CM

## 2014-04-24 NOTE — Progress Notes (Signed)
Remote pacemaker transmission.   

## 2014-04-24 NOTE — Telephone Encounter (Signed)
Spoke with pt and reminded pt of remote transmission that is due today. Pt verbalized understanding.   

## 2014-04-25 LAB — MDC_IDC_ENUM_SESS_TYPE_REMOTE
Battery Voltage: 3 V
Brady Statistic AP VP Percent: 0.1 %
Brady Statistic AS VP Percent: 2.5 %
Brady Statistic AS VS Percent: 28.01 %
Lead Channel Impedance Value: 400 Ohm
Lead Channel Impedance Value: 432 Ohm
Lead Channel Sensing Intrinsic Amplitude: 1.3997
Lead Channel Setting Pacing Amplitude: 2 V
Lead Channel Setting Pacing Amplitude: 2.5 V
Lead Channel Setting Pacing Pulse Width: 0.4 ms
Lead Channel Setting Sensing Sensitivity: 0.9 mV
MDC IDC MSMT LEADCHNL RV SENSING INTR AMPL: 5.5501
MDC IDC SESS DTM: 20150910182746
MDC IDC SET ZONE DETECTION INTERVAL: 400 ms
MDC IDC STAT BRADY AP VS PERCENT: 69.39 %
MDC IDC STAT BRADY RA PERCENT PACED: 69.49 %
MDC IDC STAT BRADY RV PERCENT PACED: 2.6 %
Zone Setting Detection Interval: 350 ms

## 2014-05-09 ENCOUNTER — Other Ambulatory Visit: Payer: Self-pay | Admitting: Cardiovascular Disease

## 2014-05-09 ENCOUNTER — Other Ambulatory Visit: Payer: Self-pay | Admitting: Internal Medicine

## 2014-05-14 ENCOUNTER — Other Ambulatory Visit: Payer: Self-pay | Admitting: Cardiovascular Disease

## 2014-05-16 ENCOUNTER — Encounter: Payer: Self-pay | Admitting: *Deleted

## 2014-05-19 ENCOUNTER — Encounter: Payer: Self-pay | Admitting: Cardiology

## 2014-05-27 ENCOUNTER — Encounter: Payer: Self-pay | Admitting: Internal Medicine

## 2014-06-12 ENCOUNTER — Other Ambulatory Visit: Payer: Self-pay | Admitting: Internal Medicine

## 2014-06-12 NOTE — Telephone Encounter (Signed)
Refill request

## 2014-07-24 ENCOUNTER — Encounter (HOSPITAL_COMMUNITY): Payer: Self-pay | Admitting: Internal Medicine

## 2014-07-28 ENCOUNTER — Ambulatory Visit (INDEPENDENT_AMBULATORY_CARE_PROVIDER_SITE_OTHER): Payer: Medicare HMO | Admitting: *Deleted

## 2014-07-28 DIAGNOSIS — I495 Sick sinus syndrome: Secondary | ICD-10-CM

## 2014-07-28 LAB — MDC_IDC_ENUM_SESS_TYPE_REMOTE
Brady Statistic AP VS Percent: 88.55 %
Brady Statistic AS VP Percent: 0.19 %
Brady Statistic AS VS Percent: 11.15 %
Brady Statistic RA Percent Paced: 88.66 %
Brady Statistic RV Percent Paced: 0.29 %
Date Time Interrogation Session: 20151214144039
Lead Channel Impedance Value: 400 Ohm
Lead Channel Impedance Value: 416 Ohm
Lead Channel Sensing Intrinsic Amplitude: 1.2 mV
Lead Channel Sensing Intrinsic Amplitude: 5.9 mV
Lead Channel Setting Pacing Amplitude: 2 V
Lead Channel Setting Pacing Amplitude: 2.5 V
Lead Channel Setting Sensing Sensitivity: 0.9 mV
MDC IDC MSMT BATTERY VOLTAGE: 3 V
MDC IDC SET LEADCHNL RV PACING PULSEWIDTH: 0.4 ms
MDC IDC STAT BRADY AP VP PERCENT: 0.1 %
Zone Setting Detection Interval: 350 ms
Zone Setting Detection Interval: 400 ms

## 2014-07-28 NOTE — Progress Notes (Signed)
Remote pacemaker transmission.   

## 2014-07-31 ENCOUNTER — Other Ambulatory Visit: Payer: Self-pay | Admitting: Internal Medicine

## 2014-08-21 ENCOUNTER — Encounter: Payer: Self-pay | Admitting: Cardiology

## 2014-08-21 ENCOUNTER — Other Ambulatory Visit: Payer: Self-pay | Admitting: Internal Medicine

## 2014-08-21 NOTE — Telephone Encounter (Signed)
Refill request

## 2014-08-26 ENCOUNTER — Encounter: Payer: Self-pay | Admitting: Internal Medicine

## 2014-09-04 ENCOUNTER — Other Ambulatory Visit: Payer: Self-pay | Admitting: Internal Medicine

## 2014-09-16 ENCOUNTER — Other Ambulatory Visit: Payer: Self-pay | Admitting: Internal Medicine

## 2014-09-16 NOTE — Telephone Encounter (Signed)
Refill request

## 2014-09-24 ENCOUNTER — Other Ambulatory Visit: Payer: Self-pay | Admitting: *Deleted

## 2014-09-24 ENCOUNTER — Ambulatory Visit (INDEPENDENT_AMBULATORY_CARE_PROVIDER_SITE_OTHER): Payer: Medicare HMO | Admitting: Internal Medicine

## 2014-09-24 ENCOUNTER — Encounter: Payer: Self-pay | Admitting: *Deleted

## 2014-09-24 ENCOUNTER — Encounter: Payer: Self-pay | Admitting: Internal Medicine

## 2014-09-24 VITALS — BP 106/65 | HR 97 | Resp 16 | Ht 60.5 in | Wt 136.0 lb

## 2014-09-24 DIAGNOSIS — Z1211 Encounter for screening for malignant neoplasm of colon: Secondary | ICD-10-CM

## 2014-09-24 DIAGNOSIS — R82998 Other abnormal findings in urine: Secondary | ICD-10-CM

## 2014-09-24 DIAGNOSIS — E785 Hyperlipidemia, unspecified: Secondary | ICD-10-CM

## 2014-09-24 DIAGNOSIS — Z0000 Encounter for general adult medical examination without abnormal findings: Secondary | ICD-10-CM

## 2014-09-24 DIAGNOSIS — I10 Essential (primary) hypertension: Secondary | ICD-10-CM

## 2014-09-24 DIAGNOSIS — N183 Chronic kidney disease, stage 3 unspecified: Secondary | ICD-10-CM

## 2014-09-24 DIAGNOSIS — N390 Urinary tract infection, site not specified: Secondary | ICD-10-CM

## 2014-09-24 DIAGNOSIS — E559 Vitamin D deficiency, unspecified: Secondary | ICD-10-CM

## 2014-09-24 DIAGNOSIS — Z0001 Encounter for general adult medical examination with abnormal findings: Secondary | ICD-10-CM

## 2014-09-24 DIAGNOSIS — E871 Hypo-osmolality and hyponatremia: Secondary | ICD-10-CM

## 2014-09-24 DIAGNOSIS — K259 Gastric ulcer, unspecified as acute or chronic, without hemorrhage or perforation: Secondary | ICD-10-CM

## 2014-09-24 LAB — CBC WITH DIFFERENTIAL/PLATELET
BASOS PCT: 0 % (ref 0–1)
Basophils Absolute: 0 10*3/uL (ref 0.0–0.1)
EOS ABS: 0.1 10*3/uL (ref 0.0–0.7)
Eosinophils Relative: 2 % (ref 0–5)
HCT: 36.2 % (ref 36.0–46.0)
Hemoglobin: 11.9 g/dL — ABNORMAL LOW (ref 12.0–15.0)
Lymphocytes Relative: 21 % (ref 12–46)
Lymphs Abs: 1.3 10*3/uL (ref 0.7–4.0)
MCH: 30.4 pg (ref 26.0–34.0)
MCHC: 32.9 g/dL (ref 30.0–36.0)
MCV: 92.3 fL (ref 78.0–100.0)
MONO ABS: 0.6 10*3/uL (ref 0.1–1.0)
MONOS PCT: 10 % (ref 3–12)
MPV: 9.6 fL (ref 8.6–12.4)
NEUTROS PCT: 67 % (ref 43–77)
Neutro Abs: 4.2 10*3/uL (ref 1.7–7.7)
Platelets: 223 10*3/uL (ref 150–400)
RBC: 3.92 MIL/uL (ref 3.87–5.11)
RDW: 12.9 % (ref 11.5–15.5)
WBC: 6.3 10*3/uL (ref 4.0–10.5)

## 2014-09-24 LAB — TSH: TSH: 1.491 u[IU]/mL (ref 0.350–4.500)

## 2014-09-24 LAB — COMPLETE METABOLIC PANEL WITH GFR
ALBUMIN: 4.3 g/dL (ref 3.5–5.2)
ALT: 11 U/L (ref 0–35)
AST: 18 U/L (ref 0–37)
Alkaline Phosphatase: 48 U/L (ref 39–117)
BUN: 26 mg/dL — AB (ref 6–23)
CO2: 22 mEq/L (ref 19–32)
Calcium: 9.2 mg/dL (ref 8.4–10.5)
Chloride: 101 mEq/L (ref 96–112)
Creat: 1.08 mg/dL (ref 0.50–1.10)
GFR, Est African American: 54 mL/min — ABNORMAL LOW
GFR, Est Non African American: 47 mL/min — ABNORMAL LOW
Glucose, Bld: 72 mg/dL (ref 70–99)
Potassium: 4.8 mEq/L (ref 3.5–5.3)
Sodium: 135 mEq/L (ref 135–145)
Total Bilirubin: 0.4 mg/dL (ref 0.2–1.2)
Total Protein: 6.6 g/dL (ref 6.0–8.3)

## 2014-09-24 LAB — LIPID PANEL
CHOL/HDL RATIO: 2.8 ratio
Cholesterol: 204 mg/dL — ABNORMAL HIGH (ref 0–200)
HDL: 74 mg/dL (ref >39)
LDL Cholesterol: 114 mg/dL — ABNORMAL HIGH (ref 0–99)
Triglycerides: 82 mg/dL (ref <150)
VLDL: 16 mg/dL (ref 0–40)

## 2014-09-24 LAB — POCT URINALYSIS DIPSTICK
Bilirubin, UA: NEGATIVE
Blood, UA: NEGATIVE
Glucose, UA: NEGATIVE
Ketones, UA: NEGATIVE
Nitrite, UA: NEGATIVE
PROTEIN UA: NEGATIVE
Spec Grav, UA: 1.01
UROBILINOGEN UA: NEGATIVE
pH, UA: 6.5

## 2014-09-24 LAB — HEMOCCULT GUIAC POC 1CARD (OFFICE): FECAL OCCULT BLD: NEGATIVE

## 2014-09-24 NOTE — Patient Instructions (Signed)
See me in 4-6 months

## 2014-09-24 NOTE — Progress Notes (Signed)
Subjective:    Patient ID: Donna Ortega, female    DOB: 1925/11/04, 81 y.o.   MRN: [id number]  HPI  04/2014 cardiology note   Assessment / Plan:      CAD (coronary artery disease) s/p CABG - Thayer Headings, MD at 04/23/2014 10:28 AM        Status: Written Related Problem: CAD (coronary artery disease) s/p CABG    She is doing well.  No angina.  She lives alone.   She complains of generalized lack of energy but is overall doing ok.   She is on max dose pravachol and her last lipids were ok / a bit elevated.  Will check lipids/ liver / BMP in 1 year.     I will see her in 1 year.       TODAY:  Donna Ortega is here for CPE  HM:  S/P hysterectomy.  ,  DEXA done 05/2013,  Mm 04/2014  Pt is a non-smoker  HTN taking meds  Using diuretic 3 times a week  Minimal low NA  See above  Osteoporosis:    She is on Fosamax for the past 2-3 years (she had stopped for a while ) and lowest T score  -3.4 radius  Fem neck -2.0    CAD  With pacer  No chest pain no SOb   No palpitaitons  Gastric erosions with GI Bleed    Allergies  Allergen Reactions  . Amiodarone Hives    ALL OVER BODY HIVES/ ITCH  . Aspirin     Gi bleed  . Lipitor [Atorvastatin Calcium]     MUSCLE ACHES  . Nsaids     GI bleed  . Niaspan [Niacin Er] Rash   Past Medical History  Diagnosis Date  . Coronary artery disease 2007    3 vessel CABG with LIMA-LAD, SVG to diag, and SVG - Ramus  . Hyperlipidemia   . Hypertension   . History of atrial fibrillation; review of all ECG are most consistent with atrial tachycardia     had rash with amiodarone; no anticoagulation due to GI bleed in December 2012  . History of hyperkalemia   . Hyponatremia   . Diverticulitis   . Palpitations   . History of GI bleed Dec 2012  . Hearing loss of left ear 07/2013  . GI bleeding 09/09/2013  . Arthritis     osteo   in back  . Pacemaker-Medtronic 02/09/2012   Past Surgical History  Procedure Laterality Date  . Maze    . Coronary  artery bypass graft  Feb 2007    Dr. Cyndia Bent;   Marland Kitchen Partial hysterectomy    . Esophagogastroduodenoscopy  08/04/2011    Procedure: ESOPHAGOGASTRODUODENOSCOPY (EGD);  Surgeon: Cleotis Nipper, MD;  Location: [facility] ENDOSCOPY;  Service: Endoscopy;  Laterality: N/A;  do at bedside  . Abdominal hysterectomy    . Psychologist, forensic    . Esophagogastroduodenoscopy N/A 09/11/2013    Procedure: ESOPHAGOGASTRODUODENOSCOPY (EGD);  Surgeon: Jeryl Columbia, MD;  Location: [facility] ENDOSCOPY;  Service: Endoscopy;  Laterality: N/A;  . Colonoscopy N/A 09/12/2013    Procedure: COLONOSCOPY;  Surgeon: Wonda Horner, MD;  Location[facility] ENDOSCOPY;  Service: Endoscopy;  Laterality: N/A;  . Permanent pacemaker insertion N/A 02/08/2012    Procedure: PERMANENT PACEMAKER INSERTION;  Surgeon: Deboraha Sprang, MD;  Location: [facility] CATH LAB;  Service: Cardiovascular;  Laterality: N/A;   History   Social History  . Marital Status: Widowed    Spouse  Name: N/A  . Number of Children: N/A  . Years of Education: N/A   Occupational History  . Not on file.   Social History Main Topics  . Smoking status: Never Smoker   . Smokeless tobacco: Never Used  . Alcohol Use: No  . Drug Use: No  . Sexual Activity: No   Other Topics Concern  . Not on file   Social History Narrative   Ambulatory usually independently, plays golf   Lives at home with family   Daughter in law Alnita Aybar [phone]   Family History  Problem Relation Age of Onset  . Heart failure Father   . Kidney failure Father   . Bone cancer Brother   . Kidney failure Sister   . Colon cancer Sister   . Cancer Sister    Patient Active Problem List   Diagnosis Date Noted  . Edema 04/09/2014  . Vertebral compression fracture 10/29/2013  . Gastric erosions 10/09/2013  . Decreased hearing of both ears 10/09/2013  . Anemia 06/26/2013  . Osteoporosis  Fosamax restarted 10/2013 06/18/2013  . Osteopenia 05/05/2013  . Palpitation 02/19/2013  . Insomnia 02/18/2013  . Candida  esophagitis 01/22/2013  . Gastric ulcer 01/22/2013  . UTI (urinary tract infection) 01/17/2013  . Atrial tachycardia 05/22/2012  . Pacemaker-Medtronic 02/09/2012  . Sinus node dysfunction 02/06/2012  . Syncope and collapse 02/06/2012  . Rash 09/19/2011  . UTI (lower urinary tract infection) 08/21/2011  . Fatigue 08/21/2011  . Peptic ulcer disease with hemorrhage 08/14/2011  . S/P vertebroplasty 08/14/2011  . Chronic kidney disease (CKD), stage III (moderate) 08/05/2011  . Anemia associated with acute blood loss 08/04/2011  . GI bleed 08/04/2011  . Dyslipidemia 08/04/2011  . Melena 08/03/2011  . CAD (coronary artery disease) s/p CABG 01/11/2011  . Atrial fibrillation/flutter 01/11/2011  . HTN (hypertension) 01/11/2011   Current Outpatient Prescriptions on File Prior to Visit  Medication Sig Dispense Refill  . alendronate (FOSAMAX) 70 MG tablet TAKE 1 TABLET (70 MG TOTAL) BY MOUTH EVERY SEVEN   DAYS. TAKE WITH A FULL GLASS OF WATER ON AT NOON TIME   EMPTY STOMACH. 12 tablet 1  . aspirin EC 81 MG tablet Take 81 mg by mouth once.     . Calcium Carbonate-Vitamin D (CALTRATE 600+D PO) Take 1 tablet by mouth daily.     Marland Kitchen diltiazem (CARDIZEM CD) 240 MG 24 hr capsule TAKE 1 CAPSULE (240 MG TOTAL) BY MOUTH DAILY. 30 capsule 12  . lisinopril (PRINIVIL,ZESTRIL) 20 MG tablet Take 1 tablet (20 mg total) by mouth daily. 90 tablet 1  . lisinopril (PRINIVIL,ZESTRIL) 20 MG tablet TAKE 1 TABLET (20 MG TOTAL) BY MOUTH DAILY. 30 tablet 3  . metoprolol (LOPRESSOR) 100 MG tablet TAKE 1 TABLET (100 MG TOTAL) BY MOUTH TWO   TIMES DAILY. 180 tablet 1  . Multiple Vitamins-Minerals (CENTRUM SILVER ADULT 50+ PO) Take 1 tablet by mouth daily.    . pantoprazole (PROTONIX) 40 MG tablet TAKE 1 TABLET (40 MG TOTAL) BY MOUTH DAILY. 90 tablet 1  . pravastatin (PRAVACHOL) 80 MG tablet TAKE 1 TABLET (80 MG TOTAL) BY MOUTH DAILY. 90 tablet 3  . triamterene-hydrochlorothiazide (MAXZIDE-25) 37.5-25 MG per tablet TAKE ONE  WHOLE TABLET BY MOUTH EVERY OTHER DAY    . triamterene-hydrochlorothiazide (MAXZIDE-25) 37.5-25 MG per tablet TAKE ONE TABLET BY MOUTH   ON MON, WEDS AND FRIDAY ONLY 12 tablet 3   No current facility-administered medications on file prior to visit.  Review of Systems  Respiratory: Negative for cough, choking, chest tightness, shortness of breath and wheezing.   Cardiovascular: Negative for chest pain, palpitations and leg swelling.  All other systems reviewed and are negative.      Objective:   Physical Exam Physical Exam  Nursing note and vitals reviewed.  Constitutional: She is oriented to person, place, and time. She appears well-developed and well-nourished.  HENT:  Head: Normocephalic and atraumatic.  Right Ear: Tympanic membrane and ear canal normal. No drainage. Tympanic membrane is not injected and not erythematous.  Left Ear: Tympanic membrane and ear canal normal. No drainage. Tympanic membrane is not injected and not erythematous.  Nose: Nose normal. Right sinus exhibits no maxillary sinus tenderness and no frontal sinus tenderness. Left sinus exhibits no maxillary sinus tenderness and no frontal sinus tenderness.  Mouth/Throat: Oropharynx is clear and moist. No oral lesions. No oropharyngeal exudate.  Eyes: Conjunctivae and EOM are normal. Pupils are equal, round, and reactive to light.  Neck: Normal range of motion. Neck supple. No JVD present. Carotid bruit is not present. No mass and no thyromegaly present.  Cardiovascular: Normal rate, regular rhythm, S1 normal, S2 normal and intact distal pulses. Exam reveals no gallop and no friction rub.  No murmur heard.  Pulses:  Carotid pulses are 2+ on the right side, and 2+ on the left side.  Dorsalis pedis pulses are 2+ on the right side, and 2+ on the left side.  No carotid bruit. No LE edema  Pulmonary/Chest: Breath sounds normal. She has no wheezes. She has no rales. She exhibits no tenderness.  Breast no  discrete mass no nipple discharge no axillary adenopathy bilaterally Abdominal: Soft. Bowel sounds are normal. She exhibits no distension and no mass. There is no hepatosplenomegaly. There is no tenderness. There is no CVA tenderness.  Rectal no mass guaiac neg Musculoskeletal: Normal range of motion.  No active synovitis to joints.  Lymphadenopathy:  She has no cervical adenopathy.  She has no axillary adenopathy.  Right: No inguinal and no supraclavicular adenopathy present.  Left: No inguinal and no supraclavicular adenopathy present.  Neurological: She is alert and oriented to person, place, and time. She has normal strength and normal reflexes. She displays no tremor. No cranial nerve deficit or sensory deficit. Coordination and gait normal.  Skin: Skin is warm and dry. No rash noted. No cyanosis. Nails show no clubbing.  Psychiatric: She has a normal mood and affect. Her speech is normal and behavior is normal. Cognition and memory are normal.           Assessment & Plan:  HM  Will order prevnar 13  Pt wishes to check with insurance.  MM, DExa utd  Nonsmoker    HTN contine meds  Advised to take diuretic 3 times per week due to low Na  Osteoporosis:  Pt. On Fosamax for approx 2-3 years now and  Repeatedly states she does not wish to take anything else.   CKD:   May be due to diuretic will check with all labs today   CAD  Tachy/brady    See me 4-6 months or prn

## 2014-09-25 LAB — URINE CULTURE

## 2014-09-25 LAB — VITAMIN D 25 HYDROXY (VIT D DEFICIENCY, FRACTURES): Vit D, 25-Hydroxy: 41 ng/mL (ref 30–100)

## 2014-09-26 ENCOUNTER — Telehealth: Payer: Self-pay | Admitting: Internal Medicine

## 2014-09-26 MED ORDER — SULFAMETHOXAZOLE-TRIMETHOPRIM 800-160 MG PO TABS: 1.0000 | ORAL_TABLET | Freq: Two times a day (BID) | ORAL | Status: DC

## 2014-09-26 NOTE — Telephone Encounter (Signed)
Donna Ortega is aware that she needs to P/U the antibiotic at her pharmacy

## 2014-09-30 ENCOUNTER — Encounter: Payer: Self-pay | Admitting: *Deleted

## 2014-10-02 ENCOUNTER — Ambulatory Visit (INDEPENDENT_AMBULATORY_CARE_PROVIDER_SITE_OTHER): Payer: Medicare HMO | Admitting: *Deleted

## 2014-10-02 DIAGNOSIS — Z23 Encounter for immunization: Secondary | ICD-10-CM

## 2014-10-23 ENCOUNTER — Other Ambulatory Visit: Payer: Self-pay | Admitting: *Deleted

## 2014-10-23 NOTE — Telephone Encounter (Signed)
Refill request

## 2014-10-24 ENCOUNTER — Other Ambulatory Visit: Payer: Self-pay | Admitting: Internal Medicine

## 2014-10-26 MED ORDER — TRIAMTERENE-HCTZ 37.5-25 MG PO TABS: ORAL_TABLET | ORAL | Status: DC

## 2014-10-29 ENCOUNTER — Ambulatory Visit (INDEPENDENT_AMBULATORY_CARE_PROVIDER_SITE_OTHER): Payer: Medicare HMO | Admitting: *Deleted

## 2014-10-29 ENCOUNTER — Encounter: Payer: Self-pay | Admitting: Internal Medicine

## 2014-10-29 DIAGNOSIS — I495 Sick sinus syndrome: Secondary | ICD-10-CM

## 2014-10-29 NOTE — Progress Notes (Signed)
Remote pacemaker transmission.   

## 2014-10-30 LAB — MDC_IDC_ENUM_SESS_TYPE_REMOTE
Battery Voltage: 2.99 V
Brady Statistic AP VP Percent: 0.86 %
Brady Statistic AP VS Percent: 66.84 %
Brady Statistic AS VP Percent: 2.32 %
Brady Statistic RA Percent Paced: 67.7 %
Date Time Interrogation Session: 20160316120307
Lead Channel Impedance Value: 400 Ohm
Lead Channel Impedance Value: 424 Ohm
Lead Channel Sensing Intrinsic Amplitude: 1.1452
Lead Channel Sensing Intrinsic Amplitude: 9.3658
Lead Channel Setting Pacing Amplitude: 2.5 V
Lead Channel Setting Pacing Pulse Width: 0.4 ms
Lead Channel Setting Sensing Sensitivity: 0.9 mV
MDC IDC SET LEADCHNL RA PACING AMPLITUDE: 2 V
MDC IDC STAT BRADY AS VS PERCENT: 29.98 %
MDC IDC STAT BRADY RV PERCENT PACED: 3.18 %
Zone Setting Detection Interval: 350 ms
Zone Setting Detection Interval: 400 ms

## 2014-11-06 ENCOUNTER — Other Ambulatory Visit: Payer: Self-pay | Admitting: Internal Medicine

## 2014-11-06 NOTE — Telephone Encounter (Signed)
Refill request

## 2014-11-10 ENCOUNTER — Other Ambulatory Visit: Payer: Self-pay | Admitting: *Deleted

## 2014-11-10 NOTE — Telephone Encounter (Signed)
Refill request

## 2014-11-11 MED ORDER — METOPROLOL TARTRATE 100 MG PO TABS: ORAL_TABLET | ORAL | Status: DC

## 2014-11-18 ENCOUNTER — Encounter: Payer: Self-pay | Admitting: Cardiology

## 2014-12-11 ENCOUNTER — Other Ambulatory Visit: Payer: Self-pay | Admitting: *Deleted

## 2015-01-03 ENCOUNTER — Other Ambulatory Visit: Payer: Self-pay | Admitting: Cardiovascular Disease

## 2015-01-27 ENCOUNTER — Encounter: Payer: Self-pay | Admitting: Internal Medicine

## 2015-01-27 ENCOUNTER — Ambulatory Visit (INDEPENDENT_AMBULATORY_CARE_PROVIDER_SITE_OTHER): Payer: Medicare HMO | Admitting: Internal Medicine

## 2015-01-27 VITALS — BP 102/60 | HR 66 | Ht 60.5 in | Wt 133.0 lb

## 2015-01-27 DIAGNOSIS — I495 Sick sinus syndrome: Secondary | ICD-10-CM

## 2015-01-27 LAB — CBC WITH DIFFERENTIAL/PLATELET
BASOS ABS: 0 10*3/uL (ref 0.0–0.1)
Basophils Relative: 0.3 % (ref 0.0–3.0)
EOS PCT: 1 % (ref 0.0–5.0)
Eosinophils Absolute: 0.1 10*3/uL (ref 0.0–0.7)
HEMATOCRIT: 37.1 % (ref 36.0–46.0)
Hemoglobin: 12.3 g/dL (ref 12.0–15.0)
LYMPHS ABS: 1.5 10*3/uL (ref 0.7–4.0)
Lymphocytes Relative: 23.8 % (ref 12.0–46.0)
MCHC: 33.1 g/dL (ref 30.0–36.0)
MCV: 95 fl (ref 78.0–100.0)
MONO ABS: 0.5 10*3/uL (ref 0.1–1.0)
Monocytes Relative: 8.3 % (ref 3.0–12.0)
NEUTROS ABS: 4.2 10*3/uL (ref 1.4–7.7)
Neutrophils Relative %: 66.6 % (ref 43.0–77.0)
Platelets: 238 10*3/uL (ref 150.0–400.0)
RBC: 3.91 Mil/uL (ref 3.87–5.11)
RDW: 13 % (ref 11.5–15.5)
WBC: 6.3 10*3/uL (ref 4.0–10.5)

## 2015-01-27 LAB — CUP PACEART INCLINIC DEVICE CHECK
Battery Voltage: 3 V
Brady Statistic AP VP Percent: 0.28 %
Brady Statistic AS VP Percent: 1.24 %
Brady Statistic AS VS Percent: 19.81 %
Brady Statistic RA Percent Paced: 78.95 %
Date Time Interrogation Session: 20160614171223
Lead Channel Impedance Value: 400 Ohm
Lead Channel Pacing Threshold Amplitude: 1 V
Lead Channel Sensing Intrinsic Amplitude: 0.9 mV
Lead Channel Sensing Intrinsic Amplitude: 10.0595
Lead Channel Setting Pacing Amplitude: 2 V
Lead Channel Setting Pacing Pulse Width: 0.4 ms
Lead Channel Setting Sensing Sensitivity: 0.9 mV
MDC IDC MSMT LEADCHNL RA IMPEDANCE VALUE: 432 Ohm
MDC IDC MSMT LEADCHNL RA PACING THRESHOLD PULSEWIDTH: 0.4 ms
MDC IDC MSMT LEADCHNL RV PACING THRESHOLD AMPLITUDE: 1 V
MDC IDC MSMT LEADCHNL RV PACING THRESHOLD PULSEWIDTH: 0.4 ms
MDC IDC SET LEADCHNL RV PACING AMPLITUDE: 2.5 V
MDC IDC SET ZONE DETECTION INTERVAL: 350 ms
MDC IDC SET ZONE DETECTION INTERVAL: 400 ms
MDC IDC STAT BRADY AP VS PERCENT: 78.68 %
MDC IDC STAT BRADY RV PERCENT PACED: 1.52 %

## 2015-01-27 MED ORDER — METOPROLOL TARTRATE 50 MG PO TABS: ORAL_TABLET | ORAL | Status: DC

## 2015-01-27 NOTE — Patient Instructions (Signed)
Medication Instructions:  Your physician has recommended you make the following change in your medication:  1) DECREASE Lopressor to 50 mg (half tablet) by mouth TWICE daily   Labwork: Your physician recommends that you return for lab work TODAY: (cbc)   Testing/Procedures: NONE  Follow-Up: Your physician recommends that you schedule a follow-up appointment in: 4-6 [place] DR. Nahser.  Your physician wants you to follow-up in: 12 months with Dr. Caryl Comes. You will receive a reminder letter in the mail two months in advance. If you don't receive a letter, please call our office to schedule the follow-up appointment.   Any Other Special Instructions Will Be Listed Below (If Applicable).

## 2015-01-27 NOTE — Progress Notes (Signed)
Patient Care Team: Bernerd Limbo, MD as PCP - General (Family Medicine) Bernerd Limbo, MD (Family Medicine)   HPI  Donna Ortega is a 81 y.o. 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  Has history of atrial fibrillation dating back 5 years for which amiod was tried along the way but was not tolerated   She is s/p CABG, has normal LV function   Echo 7/15 confirmed Because of an abnormal CHADS-VASc score she was started on Pradaxa subsequently stopped2/2 GI bleed.  She was put on aspirin instead. When she saw BE in March there was discussion about the use of apixaban as an alternative to aspirin. This never happened.  She is also having problems with palpitations.    Past Medical History  Diagnosis Date  . Coronary artery disease 2007    3 vessel CABG with LIMA-LAD, SVG to diag, and SVG - Ramus  . Hyperlipidemia   . Hypertension   . History of atrial fibrillation; review of all ECG are most consistent with atrial tachycardia     had rash with amiodarone; no anticoagulation due to GI bleed in December 2012  . History of hyperkalemia   . Hyponatremia   . Diverticulitis   . Palpitations   . History of GI bleed Dec 2012  . Hearing loss of left ear 07/2013  . GI bleeding 09/09/2013  . Arthritis     osteo   in back  . Pacemaker-Medtronic 02/09/2012    Past Surgical History  Procedure Laterality Date  . Maze    . Coronary artery bypass graft  Feb 2007    Dr. Cyndia Bent;   Marland Kitchen Partial hysterectomy    . Esophagogastroduodenoscopy  08/04/2011    Procedure: ESOPHAGOGASTRODUODENOSCOPY (EGD);  Surgeon: Cleotis Nipper, MD;  Location[facility] ENDOSCOPY;  Service: Endoscopy;  Laterality: N/A;  do at bedside  . Abdominal hysterectomy    . Psychologist, forensic    . Esophagogastroduodenoscopy N/A 09/11/2013    Procedure: ESOPHAGOGASTRODUODENOSCOPY (EGD);  Surgeon: Jeryl Columbia, MD;  Location: [facility] ENDOSCOPY;  Service: Endoscopy;  Laterality: N/A;  .  Colonoscopy N/A 09/12/2013    Procedure: COLONOSCOPY;  Surgeon: Wonda Horner, MD;  Location: [facility] ENDOSCOPY;  Service: Endoscopy;  Laterality: N/A;  . Permanent pacemaker insertion N/A 02/08/2012    Procedure: PERMANENT PACEMAKER INSERTION;  Surgeon: Deboraha Sprang, MD;  Location: [facility] CATH LAB;  Service: Cardiovascular;  Laterality: N/A;    Current Outpatient Prescriptions  Medication Sig Dispense Refill  . alendronate (FOSAMAX) 70 MG tablet TAKE 1 TABLET (70 MG TOTAL) BY MOUTH EVERY SEVEN   DAYS. TAKE WITH A FULL GLASS OF WATER ON AT NOON TIME   EMPTY STOMACH. 12 tablet 1  . aspirin EC 81 MG tablet Take 81 mg by mouth once.     . Calcium Carbonate-Vitamin D (CALTRATE 600+D PO) Take 1 tablet by mouth daily.     Marland Kitchen diltiazem (CARDIZEM CD) 240 MG 24 hr capsule TAKE 1 CAPSULE (240 MG TOTAL) BY MOUTH DAILY. 30 capsule 12  . lisinopril (PRINIVIL,ZESTRIL) 20 MG tablet Take 1 tablet (20 mg total) by mouth daily. 90 tablet 1  . lisinopril (PRINIVIL,ZESTRIL) 20 MG tablet TAKE 1 TABLET (20 MG TOTAL) BY MOUTH DAILY. 30 tablet 0  . metoprolol (LOPRESSOR) 100 MG tablet TAKE 1 TABLET (100 MG TOTAL) BY MOUTH TWO   TIMES DAILY. 180 tablet 1  . Multiple Vitamins-Minerals (CENTRUM SILVER ADULT 50+ PO) Take 1  tablet by mouth daily.    . pantoprazole (PROTONIX) 40 MG tablet TAKE 1 TABLET (40 MG TOTAL) BY MOUTH DAILY. 90 tablet 1  . pravastatin (PRAVACHOL) 80 MG tablet TAKE 1 TABLET (80 MG TOTAL) BY MOUTH DAILY. 90 tablet 3  . sulfamethoxazole-trimethoprim (BACTRIM DS,SEPTRA DS) 800-160 MG per tablet Take 1 tablet by mouth 2 (two) times daily. 6 tablet 0  . triamterene-hydrochlorothiazide (MAXZIDE-25) 37.5-25 MG per tablet TAKE ONE WHOLE TABLET BY MOUTH EVERY OTHER DAY 15 tablet 2   No current facility-administered medications for this visit.    Allergies  Allergen Reactions  . Amiodarone Hives    ALL OVER BODY HIVES/ ITCH ALL OVER BODY HIVES/ ITCH  . Aspirin     Gi bleed Gi bleed  . Atorvastatin     MUSCLE  ACHES  . Lipitor [Atorvastatin Calcium]     MUSCLE ACHES  . Nsaids     GI bleed  . Tolmetin     GI bleed  . Niacin Rash  . Niaspan [Niacin Er] Rash    Review of Systems negative except from HPI and PMH  Physical Exam BP 102/60 mmHg  Pulse 66  Ht 5' 0.5" (1.537 m)  Wt 133 lb (60.328 kg)  BMI 25.54 kg/m2 Well developed and well nourished in no acute distress HENT normal E scleral and icterus clear Neck Supple JVP flat; carotids brisk and full Clear to ausculation Rapid but regular rate and rhythm, no murmurs gallops or rub Soft with active bowel sounds No clubbing cyanosis 1+ Edema Alert and oriented, grossly normal motor and sensory function Skin Warm and Dry  ECG demonstrates atrial tachycardia at 109. Intervals 30/08/36  Assessment and  Plan  Sinus node dysfunction/chronotropic incompetence  Atrial tachycardia  Hypertension    Atrial fibrillation  GI bleeding secondary to diverticulosis  Pacemaker-Medtronic  The patient's device was interrogated.  The information was reviewed. No changes were made in the programming.     She continues with some episodes of atrial fibrillation. We have discussed again the role of anticoagulation and its relative benefit compared to aspirin. She is concerned about GI bleeding. Her hemoglobins have been stable over the last year most recently 11.9. We will arrange a repeat hemoglobin assessment and she will follow-up with Dr. Mariana Single, her primary cardiologist. At that time, if her hemoglobin is stable, would consider the use of apixaban as an alternative  she is open to the idea.  Her blood pressure is considerably lower than it has been. She has complaints of fatigue. We will decrease her metoprolol from 100--50 mg twice daily. Her atrial fibrillation per his 5.3% and while the rates are relatively poorly controlled I think the percent time of atrial fibrillation isn't sufficient to justify higher dose of metoprolol if it is contributing  to her symptoms either by way of the beta-blockade affects or the lower blood pressure effects.  We spent more than 50% of our >45 min visit in face to face counseling regarding the above

## 2015-02-14 ENCOUNTER — Other Ambulatory Visit: Payer: Self-pay | Admitting: Internal Medicine

## 2015-02-24 ENCOUNTER — Encounter: Payer: Medicare HMO | Admitting: Internal Medicine

## 2015-03-02 ENCOUNTER — Ambulatory Visit (INDEPENDENT_AMBULATORY_CARE_PROVIDER_SITE_OTHER): Payer: Medicare HMO | Admitting: Cardiovascular Disease

## 2015-03-02 ENCOUNTER — Encounter: Payer: Self-pay | Admitting: Cardiovascular Disease

## 2015-03-02 VITALS — BP 112/66 | HR 66 | Ht 60.5 in | Wt 134.0 lb

## 2015-03-02 DIAGNOSIS — I480 Paroxysmal atrial fibrillation: Secondary | ICD-10-CM | POA: Diagnosis not present

## 2015-03-02 DIAGNOSIS — I2510 Atherosclerotic heart disease of native coronary artery without angina pectoris: Secondary | ICD-10-CM

## 2015-03-02 LAB — CBC WITH DIFFERENTIAL/PLATELET
Basophils Absolute: 0 10*3/uL (ref 0.0–0.1)
Basophils Relative: 0.4 % (ref 0.0–3.0)
Eosinophils Absolute: 0 10*3/uL (ref 0.0–0.7)
Eosinophils Relative: 0.8 % (ref 0.0–5.0)
HCT: 36.2 % (ref 36.0–46.0)
Hemoglobin: 12.2 g/dL (ref 12.0–15.0)
Lymphocytes Relative: 19.3 % (ref 12.0–46.0)
Lymphs Abs: 1.2 10*3/uL (ref 0.7–4.0)
MCHC: 33.7 g/dL (ref 30.0–36.0)
MCV: 94.4 fl (ref 78.0–100.0)
Monocytes Absolute: 0.4 10*3/uL (ref 0.1–1.0)
Monocytes Relative: 6.9 % (ref 3.0–12.0)
Neutro Abs: 4.3 10*3/uL (ref 1.4–7.7)
Neutrophils Relative %: 72.6 % (ref 43.0–77.0)
Platelets: 212 10*3/uL (ref 150.0–400.0)
RBC: 3.84 Mil/uL — ABNORMAL LOW (ref 3.87–5.11)
RDW: 13.2 % (ref 11.5–15.5)
WBC: 6 10*3/uL (ref 4.0–10.5)

## 2015-03-02 MED ORDER — APIXABAN 2.5 MG PO TABS: 2.5000 mg | ORAL_TABLET | Freq: Two times a day (BID) | ORAL | Status: DC

## 2015-03-02 NOTE — Progress Notes (Signed)
Donna Ortega Date of Birth  1925/08/26 [place] [address]    [place]   [place], [place]  [address]    [place], [place]  [address] [phone]  Fax  [phone]  [phone]  Fax [phone]  Problems:  1. CAD - s/p CABG 2007, 2. PACs 3. Drug Rash - amiodarone 4. GI Bleed 5. Anemia 6. hyponatremia   History of Present Illness:  Donna Ortega is an 81 year old female with a history of coronary artery disease and atrial fibrillation. She was admitted to the hospital in December with a GI bleed, anemia, and rapid atrial fibrillation. She was discharged with the additional medications of amiodarone and Cardizem CD.  She was seen in mid January and was doing fairly well.  She stopped her Amiodarone due to drug rash.  She still has generalized fatigue and generalized weakness. She wonders if it may be due to the Lasix and potassium.  April 17, 2012 She has been more fatigued recently.  She played golf this past weekend and still is not over the exertion.  Dec. 3, 2013 :  She is doing well.  Dr. Caryl Comes stopped her Pradaxa due to GI bleed.    She is doing very well.   January 15, 2013:  Donna Ortega is doing OK.  She does not have as much energy as she would like.   She has continued to have problems with hyponatremia.  She is on maxzide which is likely exacerbating this.  No CP, no dyspnea.    Dec. 1, 2014:  Donna Ortega is doing well.  No CP or dyspnea.  Rhythm is stable.   Her Hb was a bit low when last checked by her medical doctor.  Has not started on the iron tabs.   No CP, not playing golf any more because of back pain ( s/p kyphoplasty)  .  She is still walking around the block on occasion.   She is now in her own apartment.   Sept. 9, 2015:  Donna Ortega is doing well.  She is not having any problems   March 02, 2015: No CP , no dyspnea.     Current Outpatient Prescriptions on File Prior to Visit  Medication Sig Dispense Refill  .  alendronate (FOSAMAX) 70 MG tablet TAKE 1 TABLET (70 MG TOTAL) BY MOUTH EVERY SEVEN   DAYS. TAKE WITH A FULL GLASS OF WATER ON AT NOON TIME   EMPTY STOMACH. 12 tablet 1  . aspirin EC 81 MG tablet Take 81 mg by mouth once.     . Calcium Carbonate-Vitamin D (CALTRATE 600+D PO) Take 1 tablet by mouth daily.     Marland Kitchen diltiazem (CARDIZEM CD) 240 MG 24 hr capsule TAKE 1 CAPSULE (240 MG TOTAL) BY MOUTH DAILY. 30 capsule 12  . Multiple Vitamins-Minerals (CENTRUM SILVER ADULT 50+ PO) Take 1 tablet by mouth daily.    . pantoprazole (PROTONIX) 40 MG tablet TAKE 1 TABLET (40 MG TOTAL) BY MOUTH DAILY. 90 tablet 1  . pravastatin (PRAVACHOL) 80 MG tablet TAKE 1 TABLET (80 MG TOTAL) BY MOUTH DAILY. 90 tablet 3  . triamterene-hydrochlorothiazide (MAXZIDE-25) 37.5-25 MG per tablet TAKE ONE WHOLE TABLET BY MOUTH EVERY OTHER DAY 15 tablet 2   No current facility-administered medications on file prior to visit.    Allergies  Allergen Reactions  . Amiodarone Hives    ALL OVER BODY HIVES/ ITCH ALL OVER BODY HIVES/ ITCH  .  Aspirin     Gi bleed Gi bleed  . Atorvastatin     MUSCLE ACHES  . Lipitor [Atorvastatin Calcium]     MUSCLE ACHES  . Nsaids     GI bleed  . Tolmetin     GI bleed  . Niacin Rash  . Niaspan [Niacin Er] Rash    Past Medical History  Diagnosis Date  . Coronary artery disease 2007    3 vessel CABG with LIMA-LAD, SVG to diag, and SVG - Ramus  . Hyperlipidemia   . Hypertension   . History of atrial fibrillation; review of all ECG are most consistent with atrial tachycardia     had rash with amiodarone; no anticoagulation due to GI bleed in December 2012  . History of hyperkalemia   . Hyponatremia   . Diverticulitis   . Palpitations   . History of GI bleed Dec 2012  . Hearing loss of left ear 07/2013  . GI bleeding 09/09/2013  . Arthritis     osteo   in back  . Pacemaker-Medtronic 02/09/2012    Past Surgical History  Procedure Laterality Date  . [place]    . Coronary artery bypass  graft  Feb 2007    Dr. Cyndia Bent;   Marland Kitchen Partial hysterectomy    . Esophagogastroduodenoscopy  08/04/2011    Procedure: ESOPHAGOGASTRODUODENOSCOPY (EGD);  Surgeon: Cleotis Nipper, MD;  Location[facility] ENDOSCOPY;  Service: Endoscopy;  Laterality: N/A;  do at bedside  . Abdominal hysterectomy    . Psychologist, forensic    . Esophagogastroduodenoscopy N/A 09/11/2013    Procedure: ESOPHAGOGASTRODUODENOSCOPY (EGD);  Surgeon: Jeryl Columbia, MD;  Location: [facility] ENDOSCOPY;  Service: Endoscopy;  Laterality: N/A;  . Colonoscopy N/A 09/12/2013    Procedure: COLONOSCOPY;  Surgeon: Wonda Horner, MD;  Location: [facility] ENDOSCOPY;  Service: Endoscopy;  Laterality: N/A;  . Permanent pacemaker insertion N/A 02/08/2012    Procedure: PERMANENT PACEMAKER INSERTION;  Surgeon: Deboraha Sprang, MD;  Location: [facility] CATH LAB;  Service: Cardiovascular;  Laterality: N/A;    History  Smoking status  . Never Smoker   Smokeless tobacco  . Never Used    History  Alcohol Use No    Family History  Problem Relation Age of Onset  . Heart failure Father   . Kidney failure Father   . Bone cancer Brother   . Kidney failure Sister   . Colon cancer Sister   . Cancer Sister     Reviw of Systems:  Reviewed in the HPI.  All other systems are negative.  Physical Exam: Blood pressure 112/66, pulse 66, height 5' 0.5" (1.537 m), weight 60.782 kg (134 lb). General: Well developed, well nourished, in no acute distress.  Head: Normocephalic, atraumatic, sclera non-icteric, mucus membranes are moist,   Neck: Supple. Negative for carotid bruits. JVD not elevated.  Lungs: Clear bilaterally to auscultation without wheezes, rales, or rhonchi. Breathing is unlabored.  Heart: irreg. Irreg.   normal S1 S2.    Abdomen: Soft, non-tender, non-distended with normoactive bowel sounds. No hepatomegaly. No rebound/guarding. No obvious abdominal masses.  Msk:  Strength and tone appear normal for age.  Extremities: No clubbing or cyanosis. No edema.  Distal  pedal pulses are 2+ and equal bilaterally.  Neuro: Alert and oriented X 3. Moves all extremities spontaneously.  Psych:  Responds to questions appropriately with a normal affect.   ECG:   Assessment / Plan:   1. CAD - s/p CABG 2007,   - no angina ,  Doing well   2. Atrial fib:  Has been on ASA,  We had prescribed  Pradaxa but she developed a GI bleed. We talked quite a bit about Eliquis. She agrees to try Eliquis 2.5 mg twice a day.Burnis Medin check a CBC today and we will check it again in one month. I'll see her again in 6 months for follow-up visit.  3. Drug Rash - amiodarone  4. GI Bleed  - will keep a close eye on her Hb since we are starting the Eliquis .   5. Anemia - will check Hb today .  6. Hyponatremia    Nahser, Wonda Cheng, MD  03/02/2015 9:54 AM    [facility] [place],  [place], [place]  [address] Pager [phone] Phone: [phone]; Fax: [phone]   [facility]  [address] [place], [place]  [address] [phone]    Fax [phone]

## 2015-03-02 NOTE — Patient Instructions (Signed)
Medication Instructions:  STOP Aspirin START Eliquis 2.5 mg twice daily - take 12 hours apart  Labwork: TODAY - CBC  Testing/Procedures: None Ordered  Follow-Up: Your physician recommends that you schedule a follow-up appointment in: 1 month with [facility] for New patient on Eliquis check up   Your physician wants you to follow-up in: 6 months with Dr. Acie Fredrickson.  You will receive a reminder letter in the mail two months in advance. If you don't receive a letter, please call our office to schedule the follow-up appointment.

## 2015-03-25 ENCOUNTER — Ambulatory Visit: Payer: Medicare HMO | Admitting: Internal Medicine

## 2015-04-03 ENCOUNTER — Ambulatory Visit: Payer: Medicare HMO | Admitting: Cardiovascular Disease

## 2015-04-07 ENCOUNTER — Ambulatory Visit (INDEPENDENT_AMBULATORY_CARE_PROVIDER_SITE_OTHER): Payer: Medicare HMO | Admitting: *Deleted

## 2015-04-07 DIAGNOSIS — I480 Paroxysmal atrial fibrillation: Secondary | ICD-10-CM | POA: Diagnosis not present

## 2015-04-07 LAB — BASIC METABOLIC PANEL
BUN: 35 mg/dL — AB (ref 6–23)
CHLORIDE: 99 meq/L (ref 96–112)
CO2: 25 mEq/L (ref 19–32)
Calcium: 9.5 mg/dL (ref 8.4–10.5)
Creatinine, Ser: 1.22 mg/dL — ABNORMAL HIGH (ref 0.40–1.20)
GFR: 44.32 mL/min — ABNORMAL LOW (ref >60.00)
GLUCOSE: 81 mg/dL (ref 70–99)
POTASSIUM: 4.8 meq/L (ref 3.5–5.1)
Sodium: 133 mEq/L — ABNORMAL LOW (ref 135–145)

## 2015-04-07 LAB — CBC
HCT: 35.7 % — ABNORMAL LOW (ref 36.0–46.0)
Hemoglobin: 12 g/dL (ref 12.0–15.0)
MCHC: 33.7 g/dL (ref 30.0–36.0)
MCV: 93.8 fl (ref 78.0–100.0)
PLATELETS: 224 10*3/uL (ref 150.0–400.0)
RBC: 3.81 Mil/uL — AB (ref 3.87–5.11)
RDW: 13.1 % (ref 11.5–15.5)
WBC: 6 10*3/uL (ref 4.0–10.5)

## 2015-04-07 NOTE — Progress Notes (Signed)
Pt was started on Eliquis 2.5mg  q 12 hours  for  PAF  on July 18,2016.    Reviewed patients medication list.  Pt is not  currently on any combined P-gp and strong CYP3A4 inhibitors/inducers (ketoconazole, traconazole, ritonavir, carbamazepine, phenytoin, rifampin, St. John's wort).  Reviewed labs.  SCr  1.22, Weight 60.81kg.  Dose is  appropriate based on age weight and SrCr.   Hgb 12.0 and HCT 35.7  A full discussion of the nature of anticoagulants has been carried out.  A benefit/risk analysis has been presented to the patient, so that they understand the justification for choosing anticoagulation with Eliquis at this time.  The need for compliance is stressed.  Pt is aware to take the medication twice daily.  Side effects of potential bleeding are discussed, including unusual colored urine or stools, coughing up blood or coffee ground emesis, nose bleeds or serious fall or head trauma.  Discussed signs and symptoms of stroke. The patient should avoid any OTC items containing aspirin or ibuprofen.  Avoid alcohol consumption.   Call if any signs of abnormal bleeding.  Discussed financial obligations and states is not having  any difficulty in obtaining medication.  Next lab test test in 6 months. Pt states has not missed any doses and has had no sign or symptom of bleeding nor any sign or symptom of stroke  Had been on Pradaxia in the past  and had GI Bleed  CBC and BMET done 04/08/2015 Spoke with pt and instructed that she is on the correct dose of Eliquis 2.5mg  take every 12 hours according to her lab work age and weight and made an appt for  her to be rechecked in 6 months and she states understanding

## 2015-04-28 ENCOUNTER — Encounter: Payer: Self-pay | Admitting: Internal Medicine

## 2015-04-28 ENCOUNTER — Ambulatory Visit (INDEPENDENT_AMBULATORY_CARE_PROVIDER_SITE_OTHER): Payer: Medicare HMO | Admitting: *Deleted

## 2015-04-28 ENCOUNTER — Telehealth: Payer: Self-pay | Admitting: Cardiology

## 2015-04-28 DIAGNOSIS — I495 Sick sinus syndrome: Secondary | ICD-10-CM | POA: Diagnosis not present

## 2015-04-28 NOTE — Telephone Encounter (Signed)
Spoke with pt and reminded pt of remote transmission that is due today. Pt verbalized understanding.   

## 2015-04-29 NOTE — Progress Notes (Signed)
Remote pacemaker transmission.   

## 2015-05-04 ENCOUNTER — Ambulatory Visit: Payer: Medicare HMO | Admitting: Cardiovascular Disease

## 2015-05-07 LAB — CUP PACEART REMOTE DEVICE CHECK
Brady Statistic AP VP Percent: 0.19 %
Brady Statistic AS VP Percent: 0.24 %
Brady Statistic AS VS Percent: 41.76 %
Brady Statistic RA Percent Paced: 58 %
Brady Statistic RV Percent Paced: 0.43 %
Date Time Interrogation Session: 20160913183123
Lead Channel Sensing Intrinsic Amplitude: 0.976 mV
Lead Channel Setting Pacing Amplitude: 2 V
Lead Channel Setting Pacing Amplitude: 2.5 V
Lead Channel Setting Pacing Pulse Width: 0.4 ms
Lead Channel Setting Sensing Sensitivity: 0.9 mV
MDC IDC MSMT BATTERY VOLTAGE: 3 V
MDC IDC MSMT LEADCHNL RA IMPEDANCE VALUE: 408 Ohm
MDC IDC MSMT LEADCHNL RV IMPEDANCE VALUE: 400 Ohm
MDC IDC MSMT LEADCHNL RV SENSING INTR AMPL: 6.938 mV
MDC IDC SET ZONE DETECTION INTERVAL: 400 ms
MDC IDC STAT BRADY AP VS PERCENT: 57.81 %
Zone Setting Detection Interval: 350 ms

## 2015-05-25 ENCOUNTER — Other Ambulatory Visit: Payer: Self-pay | Admitting: Cardiovascular Disease

## 2015-05-25 MED ORDER — PRAVASTATIN SODIUM 80 MG PO TABS: ORAL_TABLET | ORAL | Status: DC

## 2015-05-26 ENCOUNTER — Encounter: Payer: Self-pay | Admitting: Cardiology

## 2015-06-05 ENCOUNTER — Other Ambulatory Visit: Payer: Self-pay | Admitting: Cardiovascular Disease

## 2015-07-28 ENCOUNTER — Ambulatory Visit (INDEPENDENT_AMBULATORY_CARE_PROVIDER_SITE_OTHER): Payer: Medicare HMO | Admitting: *Deleted

## 2015-07-28 ENCOUNTER — Telehealth: Payer: Self-pay | Admitting: Cardiology

## 2015-07-28 DIAGNOSIS — I495 Sick sinus syndrome: Secondary | ICD-10-CM | POA: Diagnosis not present

## 2015-07-28 NOTE — Progress Notes (Signed)
Remote pacemaker transmission.   

## 2015-07-28 NOTE — Telephone Encounter (Signed)
Spoke with pt and reminded pt of remote transmission that is due today. Pt verbalized understanding.   

## 2015-08-01 LAB — CUP PACEART REMOTE DEVICE CHECK
Battery Voltage: 3 V
Brady Statistic AP VP Percent: 0.02 %
Brady Statistic AS VP Percent: 0.02 %
Brady Statistic AS VS Percent: 65.31 %
Brady Statistic RA Percent Paced: 34.67 %
Brady Statistic RV Percent Paced: 0.03 %
Date Time Interrogation Session: 20161213185156
Implantable Lead Implant Date: 20130626
Implantable Lead Location: 753859
Implantable Lead Model: 5086
Lead Channel Impedance Value: 424 Ohm
Lead Channel Sensing Intrinsic Amplitude: 1.188 mV
Lead Channel Setting Pacing Amplitude: 2 V
Lead Channel Setting Pacing Pulse Width: 0.4 ms
Lead Channel Setting Sensing Sensitivity: 0.9 mV
MDC IDC LEAD IMPLANT DT: 20130626
MDC IDC LEAD LOCATION: 753860
MDC IDC LEAD MODEL: 5086
MDC IDC MSMT LEADCHNL RV IMPEDANCE VALUE: 400 Ohm
MDC IDC MSMT LEADCHNL RV SENSING INTR AMPL: 7.631 mV
MDC IDC SET LEADCHNL RV PACING AMPLITUDE: 2.5 V
MDC IDC STAT BRADY AP VS PERCENT: 34.66 %

## 2015-08-06 ENCOUNTER — Encounter: Payer: Self-pay | Admitting: *Deleted

## 2015-08-12 ENCOUNTER — Encounter (HOSPITAL_BASED_OUTPATIENT_CLINIC_OR_DEPARTMENT_OTHER): Payer: Self-pay | Admitting: Emergency Medicine

## 2015-08-12 ENCOUNTER — Emergency Department (HOSPITAL_BASED_OUTPATIENT_CLINIC_OR_DEPARTMENT_OTHER): Payer: Medicare HMO

## 2015-08-12 ENCOUNTER — Emergency Department (HOSPITAL_BASED_OUTPATIENT_CLINIC_OR_DEPARTMENT_OTHER)
Admission: EM | Admit: 2015-08-12 | Discharge: 2015-08-12 | Disposition: A | Discharge status: Home / Self Care | Payer: Medicare HMO | Attending: Emergency Medicine | Admitting: Emergency Medicine

## 2015-08-12 DIAGNOSIS — S0990XA Unspecified injury of head, initial encounter: Secondary | ICD-10-CM | POA: Insufficient documentation

## 2015-08-12 DIAGNOSIS — Z950 Presence of cardiac pacemaker: Secondary | ICD-10-CM | POA: Insufficient documentation

## 2015-08-12 DIAGNOSIS — S8992XA Unspecified injury of left lower leg, initial encounter: Secondary | ICD-10-CM | POA: Diagnosis not present

## 2015-08-12 DIAGNOSIS — Z951 Presence of aortocoronary bypass graft: Secondary | ICD-10-CM | POA: Insufficient documentation

## 2015-08-12 DIAGNOSIS — Z8719 Personal history of other diseases of the digestive system: Secondary | ICD-10-CM | POA: Diagnosis not present

## 2015-08-12 DIAGNOSIS — W108XXA Fall (on) (from) other stairs and steps, initial encounter: Secondary | ICD-10-CM | POA: Diagnosis not present

## 2015-08-12 DIAGNOSIS — Y9289 Other specified places as the place of occurrence of the external cause: Secondary | ICD-10-CM | POA: Diagnosis not present

## 2015-08-12 DIAGNOSIS — R4182 Altered mental status, unspecified: Secondary | ICD-10-CM | POA: Diagnosis present

## 2015-08-12 DIAGNOSIS — H9192 Unspecified hearing loss, left ear: Secondary | ICD-10-CM | POA: Diagnosis not present

## 2015-08-12 DIAGNOSIS — Z79899 Other long term (current) drug therapy: Secondary | ICD-10-CM | POA: Diagnosis not present

## 2015-08-12 DIAGNOSIS — I10 Essential (primary) hypertension: Secondary | ICD-10-CM | POA: Diagnosis not present

## 2015-08-12 DIAGNOSIS — I4891 Unspecified atrial fibrillation: Secondary | ICD-10-CM | POA: Insufficient documentation

## 2015-08-12 DIAGNOSIS — Y9301 Activity, walking, marching and hiking: Secondary | ICD-10-CM | POA: Insufficient documentation

## 2015-08-12 DIAGNOSIS — Y998 Other external cause status: Secondary | ICD-10-CM | POA: Insufficient documentation

## 2015-08-12 DIAGNOSIS — W19XXXA Unspecified fall, initial encounter: Secondary | ICD-10-CM

## 2015-08-12 DIAGNOSIS — I2510 Atherosclerotic heart disease of native coronary artery without angina pectoris: Secondary | ICD-10-CM | POA: Diagnosis not present

## 2015-08-12 DIAGNOSIS — R600 Localized edema: Secondary | ICD-10-CM | POA: Diagnosis not present

## 2015-08-12 DIAGNOSIS — Z8739 Personal history of other diseases of the musculoskeletal system and connective tissue: Secondary | ICD-10-CM | POA: Insufficient documentation

## 2015-08-12 DIAGNOSIS — E871 Hypo-osmolality and hyponatremia: Secondary | ICD-10-CM

## 2015-08-12 DIAGNOSIS — R410 Disorientation, unspecified: Secondary | ICD-10-CM | POA: Diagnosis not present

## 2015-08-12 DIAGNOSIS — E785 Hyperlipidemia, unspecified: Secondary | ICD-10-CM | POA: Diagnosis not present

## 2015-08-12 DIAGNOSIS — N390 Urinary tract infection, site not specified: Secondary | ICD-10-CM

## 2015-08-12 DIAGNOSIS — S8991XA Unspecified injury of right lower leg, initial encounter: Secondary | ICD-10-CM | POA: Diagnosis not present

## 2015-08-12 LAB — CBC WITH DIFFERENTIAL/PLATELET
BASOS PCT: 0 %
Basophils Absolute: 0 10*3/uL (ref 0.0–0.1)
Eosinophils Absolute: 0.1 10*3/uL (ref 0.0–0.7)
Eosinophils Relative: 1 %
HEMATOCRIT: 35.9 % — AB (ref 36.0–46.0)
HEMOGLOBIN: 11.7 g/dL — AB (ref 12.0–15.0)
LYMPHS ABS: 1.4 10*3/uL (ref 0.7–4.0)
LYMPHS PCT: 16 %
MCH: 31.1 pg (ref 26.0–34.0)
MCHC: 32.6 g/dL (ref 30.0–36.0)
MCV: 95.5 fL (ref 78.0–100.0)
MONOS PCT: 10 %
Monocytes Absolute: 0.8 10*3/uL (ref 0.1–1.0)
NEUTROS ABS: 6.2 10*3/uL (ref 1.7–7.7)
NEUTROS PCT: 73 %
Platelets: 242 10*3/uL (ref 150–400)
RBC: 3.76 MIL/uL — ABNORMAL LOW (ref 3.87–5.11)
RDW: 12 % (ref 11.5–15.5)
WBC: 8.6 10*3/uL (ref 4.0–10.5)

## 2015-08-12 LAB — URINALYSIS, ROUTINE W REFLEX MICROSCOPIC
BILIRUBIN URINE: NEGATIVE
GLUCOSE, UA: NEGATIVE mg/dL
Hgb urine dipstick: NEGATIVE
Ketones, ur: 15 mg/dL — AB
NITRITE: NEGATIVE
PH: 6.5 (ref 5.0–8.0)
Protein, ur: NEGATIVE mg/dL
SPECIFIC GRAVITY, URINE: 1.017 (ref 1.005–1.030)

## 2015-08-12 LAB — BASIC METABOLIC PANEL
ANION GAP: 7 (ref 5–15)
BUN: 28 mg/dL — ABNORMAL HIGH (ref 6–20)
CHLORIDE: 97 mmol/L — AB (ref 101–111)
CO2: 26 mmol/L (ref 22–32)
Calcium: 8.8 mg/dL — ABNORMAL LOW (ref 8.9–10.3)
Creatinine, Ser: 1.53 mg/dL — ABNORMAL HIGH (ref 0.44–1.00)
GFR calc non Af Amer: 29 mL/min — ABNORMAL LOW (ref >60)
GFR, EST AFRICAN AMERICAN: 34 mL/min — AB (ref >60)
GLUCOSE: 99 mg/dL (ref 65–99)
POTASSIUM: 4.5 mmol/L (ref 3.5–5.1)
Sodium: 130 mmol/L — ABNORMAL LOW (ref 135–145)

## 2015-08-12 LAB — URINE MICROSCOPIC-ADD ON

## 2015-08-12 MED ORDER — CEPHALEXIN 500 MG PO CAPS: 500.0000 mg | ORAL_CAPSULE | Freq: Two times a day (BID) | ORAL | Status: DC

## 2015-08-12 NOTE — ED Notes (Signed)
Pt ambulatory with family member at discharge. Directed to pharmacy to pick up medications

## 2015-08-12 NOTE — ED Notes (Signed)
[place], RN at bedside to attempt IV placement with [place]

## 2015-08-12 NOTE — ED Notes (Signed)
Pt reports recent treatment for UTI. Took last dose of antibiotic today

## 2015-08-12 NOTE — ED Notes (Signed)
Pt fell down 1 step and hit head on wall, unwitnesed fall, daughter was upstairs, normally pt is oriented x 4, currently pt is confused unaware of fall and unaware of date time and situation

## 2015-08-12 NOTE — ED Notes (Signed)
Pt's family member reports pt finished Cipro one month ago and finished macrodantin yesterday. Dr. Ralene Bathe updated

## 2015-08-12 NOTE — ED Notes (Signed)
MD at bedside. 

## 2015-08-12 NOTE — ED Notes (Signed)
Patient transported to CT 

## 2015-08-12 NOTE — Discharge Instructions (Signed)
Your sodium was low today at 130 and your creatinine was elevated at 1.5.  Your kidney function will need to be rechecked by your family doctor in the next two days.  Stop taking your HCTZ for the next two days.  Get rechecked immediately if you experience any confusion, fevers, vomiting, or new concerning symptoms.  Do not take your pain pills.     Hyponatremia Hyponatremia is when the amount of salt (sodium) in your blood is too low. When sodium levels are low, your cells absorb extra water and they swell. The swelling happens throughout the body, but it mostly affects the brain. CAUSES This condition may be caused by:  Heart, kidney, or liver problems.  Thyroid problems.  Adrenal gland problems.  Metabolic conditions, such as syndrome of inappropriate antidiuretic hormone (SIADH).  Severe vomiting and diarrhea.  Certain medicines or illegal drugs.  Dehydration.  Drinking too much water.  Eating a diet that is low in sodium.  Large burns on your body.  Sweating. RISK FACTORS This condition is more likely to develop in people who:  Have long-term (chronic) kidney disease.  Have heart failure.  Have a medical condition that causes frequent or excessive diarrhea.  Have metabolic conditions, such as Addison disease or SIADH.  Take certain medicines that affect the sodium and fluid balance in the blood. Some of these medicine types include:  Diuretics.  NSAIDs.  Some opioid pain medicines.  Some antidepressants.  Some seizure prevention medicines. SYMPTOMS  Symptoms of this condition include:  Nausea and vomiting.  Confusion.  Lethargy.  Agitation.  Headache.  Seizures.  Unconsciousness.  Appetite loss.  Muscle weakness and cramping.  Feeling weak or light-headed.  Having a rapid heart rate.  Fainting, in severe cases. DIAGNOSIS This condition is diagnosed with a medical history and physical exam. You will also have other tests,  including:  Blood tests.  Urine tests. TREATMENT Treatment for this condition depends on the cause. Treatment may include:  Fluids given through an IV tube that is inserted into one of your veins.  Medicines to correct the sodium imbalance. If medicines are causing the condition, the medicines will need to be adjusted.  Limiting water or fluid intake to get the correct sodium balance. HOME CARE INSTRUCTIONS  Take medicines only as directed by your health care provider. Many medicines can make this condition worse. Talk with your health care provider about any medicines that you are currently taking.  Carefully follow a recommended diet as directed by your health care provider.  Carefully follow instructions from your health care provider about fluid restrictions.  Keep all follow-up visits as directed by your health care provider. This is important.  Do not drink alcohol. SEEK MEDICAL CARE IF:  You develop worsening nausea, fatigue, headache, confusion, or weakness.  Your symptoms go away and then return.  You have problems following the recommended diet. SEEK IMMEDIATE MEDICAL CARE IF:  You have a seizure.  You faint.  You have ongoing diarrhea or vomiting.   This information is not intended to replace advice given to you by your health care provider. Make sure you discuss any questions you have with your health care provider.   Document Released: 07/22/2002 Document Revised: 12/16/2014 Document Reviewed: 08/21/2014 Elsevier Interactive Patient Education 2016 Elsevier Inc.  Urinary Tract Infection Urinary tract infections (UTIs) can develop anywhere along your urinary tract. Your urinary tract is your body's drainage system for removing wastes and extra water. Your urinary tract includes two kidneys,  two ureters, a bladder, and a urethra. Your kidneys are a pair of bean-shaped organs. Each kidney is about the size of your fist. They are located below your ribs, one on  each side of your spine. CAUSES Infections are caused by microbes, which are microscopic organisms, including fungi, viruses, and bacteria. These organisms are so small that they can only be seen through a microscope. Bacteria are the microbes that most commonly cause UTIs. SYMPTOMS  Symptoms of UTIs may vary by age and gender of the patient and by the location of the infection. Symptoms in young women typically include a frequent and intense urge to urinate and a painful, burning feeling in the bladder or urethra during urination. Older women and men are more likely to be tired, shaky, and weak and have muscle aches and abdominal pain. A fever may mean the infection is in your kidneys. Other symptoms of a kidney infection include pain in your back or sides below the ribs, nausea, and vomiting. DIAGNOSIS To diagnose a UTI, your caregiver will ask you about your symptoms. Your caregiver will also ask you to provide a urine sample. The urine sample will be tested for bacteria and white blood cells. White blood cells are made by your body to help fight infection. TREATMENT  Typically, UTIs can be treated with medication. Because most UTIs are caused by a bacterial infection, they usually can be treated with the use of antibiotics. The choice of antibiotic and length of treatment depend on your symptoms and the type of bacteria causing your infection. HOME CARE INSTRUCTIONS  If you were prescribed antibiotics, take them exactly as your caregiver instructs you. Finish the medication even if you feel better after you have only taken some of the medication.  Drink enough water and fluids to keep your urine clear or pale yellow.  Avoid caffeine, tea, and carbonated beverages. They tend to irritate your bladder.  Empty your bladder often. Avoid holding urine for long periods of time.  Empty your bladder before and after sexual intercourse.  After a bowel movement, women should cleanse from front to back.  Use each tissue only once. SEEK MEDICAL CARE IF:   You have back pain.  You develop a fever.  Your symptoms do not begin to resolve within 3 days. SEEK IMMEDIATE MEDICAL CARE IF:   You have severe back pain or lower abdominal pain.  You develop chills.  You have nausea or vomiting.  You have continued burning or discomfort with urination. MAKE SURE YOU:   Understand these instructions.  Will watch your condition.  Will get help right away if you are not doing well or get worse.   This information is not intended to replace advice given to you by your health care provider. Make sure you discuss any questions you have with your health care provider.   Document Released: 05/11/2005 Document Revised: 04/22/2015 Document Reviewed: 09/09/2011 Elsevier Interactive Patient Education [employer].

## 2015-08-12 NOTE — ED Notes (Signed)
md linker notified of patients presentations, orders given, c collar applied, grand daughter at bedside

## 2015-08-12 NOTE — ED Provider Notes (Signed)
CSN: [id number]     Arrival date & time 08/12/15  1436 History   First MD Initiated Contact with Patient 08/12/15 1500     Chief Complaint  Patient presents with  . Fall  . Altered Mental Status     The history is provided by the patient and a relative. No language interpreter was used.   Donna Ortega is a 81 y.o. female who presents to the Emergency Department complaining of fall, confusion.  She was walking down the stairs today with some things in her hands when she had a fall. She does not recall the fall or what caused it. When her daughter-in-law found her she was slightly confused and was not sure what occurred. The granddaughter found her with her head leaning against the wall and it appears that she may have fallen down 3 stairs. The patient denies any complaints of injuries. No headache, neck pain, lower extremity weakness or numbness, cough, fevers, bowel pain, vomiting, dysuria. And followed by orthopedics for ongoing back pain, this is unchanged from baseline. She did take a pain pill this morning which is not common for her. She has also been getting treated for urinary tract infection and her last dose of antibiotics was today. She has a history of atrial fibrillation and takes Eliquis. Patient and daughter-in-law state that the patient is now at her baseline.  Past Medical History  Diagnosis Date  . Coronary artery disease 2007    3 vessel CABG with LIMA-LAD, SVG to diag, and SVG - Ramus  . Hyperlipidemia   . Hypertension   . History of atrial fibrillation; review of all ECG are most consistent with atrial tachycardia     had rash with amiodarone; no anticoagulation due to GI bleed in December 2012  . History of hyperkalemia   . Hyponatremia   . Diverticulitis   . Palpitations   . History of GI bleed Dec 2012  . Hearing loss of left ear 07/2013  . GI bleeding 09/09/2013  . Arthritis     osteo   in back  . Pacemaker-Medtronic 02/09/2012   Past Surgical History   Procedure Laterality Date  . Maze    . Coronary artery bypass graft  Feb 2007    Dr. Cyndia Bent;   Marland Kitchen Partial hysterectomy    . Esophagogastroduodenoscopy  08/04/2011    Procedure: ESOPHAGOGASTRODUODENOSCOPY (EGD);  Surgeon: Cleotis Nipper, MD;  Location: [facility] ENDOSCOPY;  Service: Endoscopy;  Laterality: N/A;  do at bedside  . Abdominal hysterectomy    . Psychologist, forensic    . Esophagogastroduodenoscopy N/A 09/11/2013    Procedure: ESOPHAGOGASTRODUODENOSCOPY (EGD);  Surgeon: Jeryl Columbia, MD;  Location: [facility] ENDOSCOPY;  Service: Endoscopy;  Laterality: N/A;  . Colonoscopy N/A 09/12/2013    Procedure: COLONOSCOPY;  Surgeon: Wonda Horner, MD;  Location: [facility] ENDOSCOPY;  Service: Endoscopy;  Laterality: N/A;  . Permanent pacemaker insertion N/A 02/08/2012    Procedure: PERMANENT PACEMAKER INSERTION;  Surgeon: Deboraha Sprang, MD;  Location: [facility] CATH LAB;  Service: Cardiovascular;  Laterality: N/A;   Family History  Problem Relation Age of Onset  . Heart failure Father   . Kidney failure Father   . Bone cancer Brother   . Kidney failure Sister   . Colon cancer Sister   . Cancer Sister    Social History  Substance Use Topics  . Smoking status: Never Smoker   . Smokeless tobacco: Never Used  . Alcohol Use: No   OB History  No data available     Review of Systems  All other systems reviewed and are negative.     Allergies  Amiodarone; Aspirin; Atorvastatin; Lipitor; Nsaids; Tolmetin; Niacin; and Niaspan  Home Medications   Prior to Admission medications   Medication Sig Start Date End Date Taking? Authorizing Provider  alendronate (FOSAMAX) 70 MG tablet TAKE 1 TABLET (70 MG TOTAL) BY MOUTH EVERY SEVEN   DAYS. TAKE WITH A FULL GLASS OF WATER ON AT NOON TIME   EMPTY STOMACH. 08/21/14   Lanice Shirts, MD  apixaban (ELIQUIS) 2.5 MG TABS tablet Take 1 tablet (2.5 mg total) by mouth 2 (two) times daily. 03/02/15   Thayer Headings, MD  Calcium Carbonate-Vitamin D (CALTRATE 600+D PO) Take 1  tablet by mouth daily.     Historical Provider, MD  cephALEXin (KEFLEX) 500 MG capsule Take 1 capsule (500 mg total) by mouth 2 (two) times daily. 08/12/15   Quintella Reichert, MD  diltiazem (CARDIZEM CD) 240 MG 24 hr capsule TAKE 1 CAPSULE (240 MG TOTAL) BY MOUTH DAILY. 06/05/15   Thayer Headings, MD  lisinopril (PRINIVIL,ZESTRIL) 40 MG tablet Take 40 mg by mouth daily.    Historical Provider, MD  metoprolol (LOPRESSOR) 50 MG tablet Take 25 mg by mouth 2 (two) times daily.    Historical Provider, MD  Multiple Vitamins-Minerals (CENTRUM SILVER ADULT 50+ PO) Take 1 tablet by mouth daily.    Historical Provider, MD  pantoprazole (PROTONIX) 40 MG tablet TAKE 1 TABLET (40 MG TOTAL) BY MOUTH DAILY. 09/17/14   Lanice Shirts, MD  pravastatin (PRAVACHOL) 80 MG tablet TAKE 1 TABLET (80 MG TOTAL) BY MOUTH DAILY. 05/25/15   Thayer Headings, MD  triamterene-hydrochlorothiazide (MAXZIDE-25) 37.5-25 MG per tablet TAKE ONE WHOLE TABLET BY MOUTH EVERY OTHER DAY 10/26/14   Lanice Shirts, MD   BP 119/66 mmHg  Pulse 67  Temp(Src) 98 F (36.7 C) (Oral)  Resp 21  Ht 5\' 2"  (1.575 m)  Wt 134 lb (60.782 kg)  BMI 24.50 kg/m2  SpO2 94% Physical Exam  Constitutional: She is oriented to person, place, and time. She appears well-developed and well-nourished.  HENT:  Head: Normocephalic and atraumatic.  Neck:  No C-spine tenderness  Cardiovascular: Normal rate and regular rhythm.   No murmur heard. Pulmonary/Chest: Effort normal and breath sounds normal. No respiratory distress.  Abdominal: Soft. There is no tenderness. There is no rebound and no guarding.  Musculoskeletal: She exhibits no tenderness.  Trace edema in bilateral lower extremities.  Neurological: She is alert and oriented to person, place, and time.  5 out of 5 strength in all 4 extremities.  Skin: Skin is warm and dry.  Psychiatric: She has a normal mood and affect. Her behavior is normal.  Nursing note and vitals reviewed.   ED Course   Procedures (including critical care time) Labs Review Labs Reviewed  BASIC METABOLIC PANEL - Abnormal; Notable for the following:    Sodium 130 (*)    Chloride 97 (*)    BUN 28 (*)    Creatinine, Ser 1.53 (*)    Calcium 8.8 (*)    GFR calc non Af Amer 29 (*)    GFR calc Af Amer 34 (*)    All other components within normal limits  CBC WITH DIFFERENTIAL/PLATELET - Abnormal; Notable for the following:    RBC 3.76 (*)    Hemoglobin 11.7 (*)    HCT 35.9 (*)    All other components within normal  limits  URINALYSIS, ROUTINE W REFLEX MICROSCOPIC (NOT AT [facility]) - Abnormal; Notable for the following:    APPearance CLOUDY (*)    Ketones, ur 15 (*)    Leukocytes, UA LARGE (*)    All other components within normal limits  URINE MICROSCOPIC-ADD ON - Abnormal; Notable for the following:    Squamous Epithelial / LPF 0-5 (*)    Bacteria, UA MANY (*)    All other components within normal limits  URINE CULTURE    Imaging Review Ct Head Wo Contrast  08/12/2015  CLINICAL DATA:  Patient fell today going down steps. Patient does not remember falling. Head pain. Confusion. Neck pain. EXAM: CT HEAD WITHOUT CONTRAST CT CERVICAL SPINE WITHOUT CONTRAST TECHNIQUE: Multidetector CT imaging of the head and cervical spine was performed following the standard protocol without intravenous contrast. Multiplanar CT image reconstructions of the cervical spine were also generated. COMPARISON:  CT head 07/13/2009 FINDINGS: CT HEAD FINDINGS No evidence for acute infarction, hemorrhage, mass lesion, hydrocephalus, or extra-axial fluid. Advanced atrophy. Chronic microvascular ischemic change. Remote RIGHT frontal cortical and subcortical infarct. Vascular calcification. No significant scalp hematoma or laceration. Negative sinuses and mastoids. BILATERAL cataract extraction. Compared with 2010, atrophy and small vessel disease have progressed. RIGHT frontal infarct was not present at that time. CT CERVICAL SPINE FINDINGS  There is no visible cervical spine fracture, traumatic subluxation, prevertebral soft tissue swelling, or intraspinal hematoma. Mild degenerative anterolisthesis at C3-4, C4-5, and C5-6 is facet mediated. There is skeletal osteopenia. There is significant disc space narrowing at C5-6 and C6-7 of a chronic nature. Advanced facet arthropathy is worst at C5-6. Advanced atherosclerosis with dolichoectasia of the subclavians. No lung apex lesion. No neck masses. Airway midline. No upper rib fractures. IMPRESSION: Atrophy and small vessel disease.  Old RIGHT frontal infarct. No skull fracture or intracranial hemorrhage. No cervical spine fracture or traumatic subluxation. Multilevel spondylosis, worst at C5-6 and C6-7. Electronically Signed   By: Staci Righter M.D.   On: 08/12/2015 15:34   Ct Cervical Spine Wo Contrast  08/12/2015  CLINICAL DATA:  Patient fell today going down steps. Patient does not remember falling. Head pain. Confusion. Neck pain. EXAM: CT HEAD WITHOUT CONTRAST CT CERVICAL SPINE WITHOUT CONTRAST TECHNIQUE: Multidetector CT imaging of the head and cervical spine was performed following the standard protocol without intravenous contrast. Multiplanar CT image reconstructions of the cervical spine were also generated. COMPARISON:  CT head 07/13/2009 FINDINGS: CT HEAD FINDINGS No evidence for acute infarction, hemorrhage, mass lesion, hydrocephalus, or extra-axial fluid. Advanced atrophy. Chronic microvascular ischemic change. Remote RIGHT frontal cortical and subcortical infarct. Vascular calcification. No significant scalp hematoma or laceration. Negative sinuses and mastoids. BILATERAL cataract extraction. Compared with 2010, atrophy and small vessel disease have progressed. RIGHT frontal infarct was not present at that time. CT CERVICAL SPINE FINDINGS There is no visible cervical spine fracture, traumatic subluxation, prevertebral soft tissue swelling, or intraspinal hematoma. Mild degenerative  anterolisthesis at C3-4, C4-5, and C5-6 is facet mediated. There is skeletal osteopenia. There is significant disc space narrowing at C5-6 and C6-7 of a chronic nature. Advanced facet arthropathy is worst at C5-6. Advanced atherosclerosis with dolichoectasia of the subclavians. No lung apex lesion. No neck masses. Airway midline. No upper rib fractures. IMPRESSION: Atrophy and small vessel disease.  Old RIGHT frontal infarct. No skull fracture or intracranial hemorrhage. No cervical spine fracture or traumatic subluxation. Multilevel spondylosis, worst at C5-6 and C6-7. Electronically Signed   By: Roderic Ovens.D.  On: 08/12/2015 15:34   I have personally reviewed and evaluated these images and lab results as part of my medical decision-making.   EKG Interpretation   Date/Time:  Wednesday August 12 2015 16:08:18 EST Ventricular Rate:  70 PR Interval:  205 QRS Duration: 93 QT Interval:  427 QTC Calculation: 461 R Axis:   48 Text Interpretation:  Atrial fibrillation no acute ST changes Confirmed by  Hazle Coca [phone]) on 08/12/2015 4:20:50 PM      MDM   Final diagnoses:  Fall, initial encounter  Hyponatremia  Acute UTI    Patient here for evaluation of injuries following a fall, patient did have some confusion prior to ED arrival. She is nontoxic appearing on examination with no significant injuries on exam. She has a history of atrial fibrillation and EKG demonstrates a rate controlled atrial fibrillation. Labs demonstrates hyponatremia with rising creatinine. Patient has a history of hyponatremia that was previously related to excessive water consumption. She is not currently consuming excessive amounts of water and is euvolemic on examination. Discussed discontinuing her HCTZ with close follow-up for recheck of her sodium and creatinine. She did take a pain pill this morning, question if this contributed some to her previous fusion. Discussed discontinuing the pain pills at this time.  UA is concerning for UTI, cultures are sent and will start on Keflex. The patient just finished a course of Macrobid.    Quintella Reichert, MD 08/13/15 [phone]

## 2015-08-14 ENCOUNTER — Encounter (HOSPITAL_COMMUNITY): Payer: Self-pay | Admitting: *Deleted

## 2015-08-14 ENCOUNTER — Emergency Department (HOSPITAL_COMMUNITY): Payer: Medicare HMO

## 2015-08-14 ENCOUNTER — Emergency Department (HOSPITAL_COMMUNITY)
Admission: EM | Admit: 2015-08-14 | Discharge: 2015-08-14 | Disposition: A | Discharge status: Home / Self Care | Payer: Medicare HMO | Attending: Emergency Medicine | Admitting: Emergency Medicine

## 2015-08-14 DIAGNOSIS — Z8719 Personal history of other diseases of the digestive system: Secondary | ICD-10-CM | POA: Insufficient documentation

## 2015-08-14 DIAGNOSIS — I2510 Atherosclerotic heart disease of native coronary artery without angina pectoris: Secondary | ICD-10-CM | POA: Diagnosis not present

## 2015-08-14 DIAGNOSIS — I4891 Unspecified atrial fibrillation: Secondary | ICD-10-CM | POA: Diagnosis not present

## 2015-08-14 DIAGNOSIS — Z792 Long term (current) use of antibiotics: Secondary | ICD-10-CM | POA: Diagnosis not present

## 2015-08-14 DIAGNOSIS — I10 Essential (primary) hypertension: Secondary | ICD-10-CM | POA: Insufficient documentation

## 2015-08-14 DIAGNOSIS — Z87440 Personal history of urinary (tract) infections: Secondary | ICD-10-CM | POA: Diagnosis not present

## 2015-08-14 DIAGNOSIS — Z951 Presence of aortocoronary bypass graft: Secondary | ICD-10-CM | POA: Insufficient documentation

## 2015-08-14 DIAGNOSIS — H538 Other visual disturbances: Secondary | ICD-10-CM | POA: Diagnosis present

## 2015-08-14 DIAGNOSIS — E785 Hyperlipidemia, unspecified: Secondary | ICD-10-CM | POA: Diagnosis not present

## 2015-08-14 DIAGNOSIS — Z950 Presence of cardiac pacemaker: Secondary | ICD-10-CM | POA: Insufficient documentation

## 2015-08-14 DIAGNOSIS — Z7901 Long term (current) use of anticoagulants: Secondary | ICD-10-CM | POA: Insufficient documentation

## 2015-08-14 DIAGNOSIS — Z8739 Personal history of other diseases of the musculoskeletal system and connective tissue: Secondary | ICD-10-CM | POA: Diagnosis not present

## 2015-08-14 DIAGNOSIS — Z79899 Other long term (current) drug therapy: Secondary | ICD-10-CM | POA: Insufficient documentation

## 2015-08-14 LAB — CBC WITH DIFFERENTIAL/PLATELET
Basophils Absolute: 0 10*3/uL (ref 0.0–0.1)
Basophils Relative: 0 %
EOS ABS: 0.1 10*3/uL (ref 0.0–0.7)
EOS PCT: 1 %
HCT: 32.3 % — ABNORMAL LOW (ref 36.0–46.0)
Hemoglobin: 10.9 g/dL — ABNORMAL LOW (ref 12.0–15.0)
LYMPHS ABS: 1.3 10*3/uL (ref 0.7–4.0)
LYMPHS PCT: 19 %
MCH: 31.6 pg (ref 26.0–34.0)
MCHC: 33.7 g/dL (ref 30.0–36.0)
MCV: 93.6 fL (ref 78.0–100.0)
MONO ABS: 0.6 10*3/uL (ref 0.1–1.0)
MONOS PCT: 8 %
Neutro Abs: 5.1 10*3/uL (ref 1.7–7.7)
Neutrophils Relative %: 72 %
PLATELETS: 180 10*3/uL (ref 150–400)
RBC: 3.45 MIL/uL — ABNORMAL LOW (ref 3.87–5.11)
RDW: 12.4 % (ref 11.5–15.5)
WBC: 7 10*3/uL (ref 4.0–10.5)

## 2015-08-14 LAB — COMPREHENSIVE METABOLIC PANEL
ALT: 16 U/L (ref 14–54)
ANION GAP: 10 (ref 5–15)
AST: 25 U/L (ref 15–41)
Albumin: 4 g/dL (ref 3.5–5.0)
Alkaline Phosphatase: 62 U/L (ref 38–126)
BUN: 27 mg/dL — ABNORMAL HIGH (ref 6–20)
CHLORIDE: 97 mmol/L — AB (ref 101–111)
CO2: 25 mmol/L (ref 22–32)
CREATININE: 1.26 mg/dL — AB (ref 0.44–1.00)
Calcium: 9.3 mg/dL (ref 8.9–10.3)
GFR, EST AFRICAN AMERICAN: 43 mL/min — AB (ref >60)
GFR, EST NON AFRICAN AMERICAN: 37 mL/min — AB (ref >60)
Glucose, Bld: 101 mg/dL — ABNORMAL HIGH (ref 65–99)
Potassium: 4.9 mmol/L (ref 3.5–5.1)
SODIUM: 132 mmol/L — AB (ref 135–145)
Total Bilirubin: 0.4 mg/dL (ref 0.3–1.2)
Total Protein: 6.6 g/dL (ref 6.5–8.1)

## 2015-08-14 LAB — URINALYSIS, ROUTINE W REFLEX MICROSCOPIC
BILIRUBIN URINE: NEGATIVE
Glucose, UA: NEGATIVE mg/dL
Hgb urine dipstick: NEGATIVE
KETONES UR: NEGATIVE mg/dL
NITRITE: NEGATIVE
PH: 6 (ref 5.0–8.0)
Protein, ur: NEGATIVE mg/dL
SPECIFIC GRAVITY, URINE: 1.012 (ref 1.005–1.030)

## 2015-08-14 LAB — URINE CULTURE

## 2015-08-14 LAB — URINE MICROSCOPIC-ADD ON: RBC / HPF: NONE SEEN RBC/hpf (ref 0–5)

## 2015-08-14 NOTE — ED Provider Notes (Signed)
MSE was initiated and I personally evaluated the patient and placed orders (if any) at  4:02 PM on August 14, 2015.  Blurred vision since yesterday. Fall 2 days ago, evaluated then with head CT. Also be evaluated for low sodium, and being treated for UTI. No Headache at this time. No vomiting.   Alert and calm. She can read writing on signs on the wall in her exam room, correctly.  The patient appears stable so that the remainder of the MSE may be completed by another provider.  Daleen Bo, MD 08/14/15 [phone]

## 2015-08-14 NOTE — ED Notes (Signed)
She fell Wednesday and maybe lost consciousness.  She was seen at [facility] and had a c-t that was negative.  She is taking eliquis.  Since she woke up this am she has had blurred vision she woke up at 0700am.   No pain or nausea.  Both feet and leg cramps since yesterday.. No facial droop  Equal hand grips no arm or leg drifts

## 2015-08-14 NOTE — ED Notes (Signed)
DR Eulis Foster SAW PT IN TRIAGE AND OTHER ORDERS PLACED

## 2015-08-14 NOTE — ED Provider Notes (Signed)
CSN: [id number]     Arrival date & time 08/14/15  1533 History   First MD Initiated Contact with Patient 08/14/15 1806     Chief Complaint  Patient presents with  . Fall     (Consider location/radiation/quality/duration/timing/severity/associated sxs/prior Treatment) HPI   Donna Ortega is a 81 y.o. female who presents for evaluation of blurred vision. Blurred vision was first noticed yesterday, and it probably her today when she was driving her car. She had trouble seeing the yellow lines, on the road, clearly. She denies headache, nausea, vomiting, weakness or dizziness. She had fall and mild head injury, 2 days ago, after which she was evaluated at our affiliated emergency department. Head scan was negative at that time. No preceding problems with vision. She was taken off her hydrochlorothiazide recently because of elevated creatinine. No recent fever or chills. She was treated for a urinary tract infection about 4 weeks ago. There are no other known modifying factors.    Past Medical History  Diagnosis Date  . Coronary artery disease 2007    3 vessel CABG with LIMA-LAD, SVG to diag, and SVG - Ramus  . Hyperlipidemia   . Hypertension   . History of atrial fibrillation; review of all ECG are most consistent with atrial tachycardia     had rash with amiodarone; no anticoagulation due to GI bleed in December 2012  . History of hyperkalemia   . Hyponatremia   . Diverticulitis   . Palpitations   . History of GI bleed Dec 2012  . Hearing loss of left ear 07/2013  . GI bleeding 09/09/2013  . Arthritis     osteo   in back  . Pacemaker-Medtronic 02/09/2012   Past Surgical History  Procedure Laterality Date  . Maze    . Coronary artery bypass graft  Feb 2007    Dr. Cyndia Bent;   Marland Kitchen Partial hysterectomy    . Esophagogastroduodenoscopy  08/04/2011    Procedure: ESOPHAGOGASTRODUODENOSCOPY (EGD);  Surgeon: Cleotis Nipper, MD;  Location: [facility] ENDOSCOPY;  Service: Endoscopy;  Laterality: N/A;   do at bedside  . Abdominal hysterectomy    . Psychologist, forensic    . Esophagogastroduodenoscopy N/A 09/11/2013    Procedure: ESOPHAGOGASTRODUODENOSCOPY (EGD);  Surgeon: Jeryl Columbia, MD;  Location: [facility] ENDOSCOPY;  Service: Endoscopy;  Laterality: N/A;  . Colonoscopy N/A 09/12/2013    Procedure: COLONOSCOPY;  Surgeon: Wonda Horner, MD;  Location: [facility] ENDOSCOPY;  Service: Endoscopy;  Laterality: N/A;  . Permanent pacemaker insertion N/A 02/08/2012    Procedure: PERMANENT PACEMAKER INSERTION;  Surgeon: Deboraha Sprang, MD;  Location: [facility] CATH LAB;  Service: Cardiovascular;  Laterality: N/A;   Family History  Problem Relation Age of Onset  . Heart failure Father   . Kidney failure Father   . Bone cancer Brother   . Kidney failure Sister   . Colon cancer Sister   . Cancer Sister    Social History  Substance Use Topics  . Smoking status: Never Smoker   . Smokeless tobacco: Never Used  . Alcohol Use: No   OB History    No data available     Review of Systems  All other systems reviewed and are negative.     Allergies  Amiodarone; Aspirin; Atorvastatin; Lipitor; Nsaids; Tolmetin; Niacin; and Niaspan  Home Medications   Prior to Admission medications   Medication Sig Start Date End Date Taking? Authorizing Provider  alendronate (FOSAMAX) 70 MG tablet TAKE 1 TABLET (70 MG TOTAL) BY MOUTH  EVERY SEVEN   DAYS. TAKE WITH A FULL GLASS OF WATER ON AT NOON TIME   EMPTY STOMACH. 08/21/14   Lanice Shirts, MD  apixaban (ELIQUIS) 2.5 MG TABS tablet Take 1 tablet (2.5 mg total) by mouth 2 (two) times daily. 03/02/15   Thayer Headings, MD  Calcium Carbonate-Vitamin D (CALTRATE 600+D PO) Take 1 tablet by mouth daily.     Historical Provider, MD  cephALEXin (KEFLEX) 500 MG capsule Take 1 capsule (500 mg total) by mouth 2 (two) times daily. 08/12/15   Quintella Reichert, MD  diltiazem (CARDIZEM CD) 240 MG 24 hr capsule TAKE 1 CAPSULE (240 MG TOTAL) BY MOUTH DAILY. 06/05/15   Thayer Headings, MD  lisinopril  (PRINIVIL,ZESTRIL) 40 MG tablet Take 40 mg by mouth daily.    Historical Provider, MD  metoprolol (LOPRESSOR) 50 MG tablet Take 25 mg by mouth 2 (two) times daily.    Historical Provider, MD  Multiple Vitamins-Minerals (CENTRUM SILVER ADULT 50+ PO) Take 1 tablet by mouth daily.    Historical Provider, MD  pantoprazole (PROTONIX) 40 MG tablet TAKE 1 TABLET (40 MG TOTAL) BY MOUTH DAILY. 09/17/14   Lanice Shirts, MD  pravastatin (PRAVACHOL) 80 MG tablet TAKE 1 TABLET (80 MG TOTAL) BY MOUTH DAILY. 05/25/15   Thayer Headings, MD  triamterene-hydrochlorothiazide (MAXZIDE-25) 37.5-25 MG per tablet TAKE ONE WHOLE TABLET BY MOUTH EVERY OTHER DAY 10/26/14   Lanice Shirts, MD   BP 149/69 mmHg  Pulse 69  Temp(Src) 98.9 F (37.2 C)  Resp 20  Ht 5\' 3"  (1.6 m)  Wt 134 lb 9 oz (61.037 kg)  BMI 23.84 kg/m2  SpO2 100% Physical Exam  Constitutional: She is oriented to person, place, and time. She appears well-developed.  Elderly, frail  HENT:  Head: Normocephalic and atraumatic.  Right Ear: External ear normal.  Left Ear: External ear normal.  Eyes: Conjunctivae and EOM are normal. Pupils are equal, round, and reactive to light.  Neck: Normal range of motion and phonation normal. Neck supple.  Cardiovascular: Normal rate, regular rhythm and normal heart sounds.   Pulmonary/Chest: Effort normal and breath sounds normal. She exhibits no bony tenderness.  Abdominal: Soft. There is no tenderness.  Musculoskeletal: Normal range of motion.  Neurological: She is alert and oriented to person, place, and time. No cranial nerve deficit or sensory deficit. She exhibits normal muscle tone. Coordination normal.  Skin: Skin is warm, dry and intact.  Psychiatric: She has a normal mood and affect. Her behavior is normal. Judgment and thought content normal.  Nursing note and vitals reviewed.   ED Course  Procedures (including critical care time)  Medications - No data to display  Patient Vitals for  the past 24 hrs:  BP Temp Pulse Resp SpO2 Height Weight  08/14/15 1815 149/69 mmHg - 69 20 100 % - -  08/14/15 1546 139/69 mmHg 98.9 F (37.2 C) 72 20 98 % 5\' 3"  (1.6 m) 134 lb 9 oz (61.037 kg)    6:41 PM Reevaluation with update and discussion. After initial assessment and treatment, an updated evaluation reveals no change in clinical status. Visual acuity has been performed and shows bilateral mild vision loss at distance. Findings discussed with patient, and son, all questions answered. [place] Review Labs Reviewed  CBC WITH DIFFERENTIAL/PLATELET - Abnormal; Notable for the following:    RBC 3.45 (*)    Hemoglobin 10.9 (*)    HCT 32.3 (*)  All other components within normal limits  COMPREHENSIVE METABOLIC PANEL - Abnormal; Notable for the following:    Sodium 132 (*)    Chloride 97 (*)    Glucose, Bld 101 (*)    BUN 27 (*)    Creatinine, Ser 1.26 (*)    GFR calc non Af Amer 37 (*)    GFR calc Af Amer 43 (*)    All other components within normal limits  URINALYSIS, ROUTINE W REFLEX MICROSCOPIC (NOT AT [facility]) - Abnormal; Notable for the following:    APPearance CLOUDY (*)    Leukocytes, UA SMALL (*)    All other components within normal limits  URINE MICROSCOPIC-ADD ON - Abnormal; Notable for the following:    Squamous Epithelial / LPF 0-5 (*)    Bacteria, UA FEW (*)    All other components within normal limits   SODIUM  Date Value Ref Range Status  08/14/2015 132* 135 - 145 mmol/L Final  08/12/2015 130* 135 - 145 mmol/L Final  04/07/2015 133* 135 - 145 mEq/L Final  09/24/2014 135 135 - 145 mEq/L Final   BUN  Date Value Ref Range Status  08/14/2015 27* 6 - 20 mg/dL Final  08/12/2015 28* 6 - 20 mg/dL Final  04/07/2015 35* 6 - 23 mg/dL Final  09/24/2014 26* 6 - 23 mg/dL Final   CREAT  Date Value Ref Range Status  09/24/2014 1.08 0.50 - 1.10 mg/dL Final  04/09/2014 1.14* 0.50 - 1.10 mg/dL Final  10/09/2013 1.09 0.50 - 1.10 mg/dL Final  06/18/2013  0.95 0.50 - 1.10 mg/dL Final   CREATININE, SER  Date Value Ref Range Status  08/14/2015 1.26* 0.44 - 1.00 mg/dL Final  08/12/2015 1.53* 0.44 - 1.00 mg/dL Final  04/07/2015 1.22* 0.40 - 1.20 mg/dL Final  09/12/2013 0.81 0.50 - 1.10 mg/dL Final      Imaging Review Ct Head Wo Contrast  08/14/2015  CLINICAL DATA:  Double vision after falling and hitting head 2 days ago. EXAM: CT HEAD WITHOUT CONTRAST TECHNIQUE: Contiguous axial images were obtained from the base of the skull through the vertex without intravenous contrast. COMPARISON:  CT scan of July 13, 2009. FINDINGS: Bony calvarium appears intact. Moderate diffuse cortical atrophy is noted. Mild chronic ischemic white matter disease is noted. No mass effect or midline shift is noted. Ventricular size is within normal limits. There is no evidence of mass lesion, hemorrhage or acute infarction. IMPRESSION: Mild chronic ischemic white matter disease. Moderate diffuse cortical atrophy. No acute intracranial abnormality seen. Electronically Signed   By: Marijo Conception, M.D.   On: 08/14/2015 16:34   I have personally reviewed and evaluated these images and lab results as part of my medical decision-making.   EKG Interpretation None      MDM   Final diagnoses:  Blurred vision    Blurred vision, not likely related to head injury. Incidental finding of mild renal insufficiency which has improved over the last 2 days, based on review of labs. Incidental anemia is likely delusional, and she has a normal MCV. Doubt serious bacterial infection. Metabolic instability or impending vascular collapse.  Nursing Notes Reviewed/ Care Coordinated Applicable Imaging Reviewed Interpretation of Laboratory Data incorporated into ED treatment  The patient appears reasonably screened and/or stabilized for discharge and I doubt any other medical condition or other [facility] requiring further screening, evaluation, or treatment in the ED at this time prior to  discharge.  Plan: Home Medications- usual; Home Treatments- rest; return here if the  recommended treatment, does not improve the symptoms; Recommended follow up- PCP prn     Daleen Bo, MD 08/15/15 [phone]

## 2015-08-14 NOTE — Discharge Instructions (Signed)
The CAT scan was reassuring that there was no injury causing your blurred vision. Your renal function is improving today. Your hemoglobin is somewhat low, as is your sodium. Consider seeing an ophthalmologist about your blurred vision. Your primary care doctor may be able to help you with that as well.   Blurred Vision Having blurred vision means that you cannot see things clearly. Your vision may seem fuzzy or out of focus. Blurred vision is a very common symptom of an eye or vision problem. Blurred vision is often a gradual blur that occurs in one eye or both eyes. There are many causes of blurred vision, including cataracts, macular degeneration, and diabetic retinopathy. Blurred vision can be diagnosed based on your symptoms and a physical exam. Tell your health care provider about any other health problems you have, any recent eye injury, and any prior surgeries. You may need to see a health care provider who specializes in eye problems (ophthalmologist). Your treatment depends on what is causing your blurred vision.  HOME CARE INSTRUCTIONS  Tell your health care provider about any changes in your blurred vision.  Do not drive or operate heavy machinery if your vision is blurry.  Keep all follow-up visits as directed by your health care provider. This is important. SEEK MEDICAL CARE IF:  Your symptoms get worse.  You have new symptoms.  You have trouble seeing at night.  You have trouble seeing up close or far away.  You have trouble noticing the difference between colors. SEEK IMMEDIATE MEDICAL CARE IF:  You have severe eye pain.  You have a severe headache.  You have flashing lights in your field of vision.  You have a sudden change in vision.  You have a sudden loss of vision.  You have vision change after an injury.  You notice drainage coming from your eyes.  You notice a rash around your eyes.   This information is not intended to replace advice given to you  by your health care provider. Make sure you discuss any questions you have with your health care provider.   Document Released: 08/04/2003 Document Revised: 12/16/2014 Document Reviewed: 06/25/2014 Elsevier Interactive Patient Education [employer].

## 2015-08-18 DIAGNOSIS — N3281 Overactive bladder: Secondary | ICD-10-CM | POA: Diagnosis not present

## 2015-08-18 DIAGNOSIS — E871 Hypo-osmolality and hyponatremia: Secondary | ICD-10-CM | POA: Diagnosis not present

## 2015-08-18 DIAGNOSIS — N312 Flaccid neuropathic bladder, not elsewhere classified: Secondary | ICD-10-CM | POA: Diagnosis not present

## 2015-08-18 DIAGNOSIS — N3941 Urge incontinence: Secondary | ICD-10-CM | POA: Diagnosis not present

## 2015-08-21 DIAGNOSIS — W19XXXD Unspecified fall, subsequent encounter: Secondary | ICD-10-CM | POA: Diagnosis not present

## 2015-08-21 DIAGNOSIS — E871 Hypo-osmolality and hyponatremia: Secondary | ICD-10-CM | POA: Diagnosis not present

## 2015-08-30 ENCOUNTER — Emergency Department (HOSPITAL_COMMUNITY): Payer: Medicare HMO

## 2015-08-30 ENCOUNTER — Encounter (HOSPITAL_COMMUNITY): Payer: Self-pay | Admitting: *Deleted

## 2015-08-30 ENCOUNTER — Emergency Department (HOSPITAL_COMMUNITY)
Admission: EM | Admit: 2015-08-30 | Discharge: 2015-08-30 | Disposition: A | Discharge status: Home / Self Care | Payer: Medicare HMO | Attending: Emergency Medicine | Admitting: Emergency Medicine

## 2015-08-30 DIAGNOSIS — Z792 Long term (current) use of antibiotics: Secondary | ICD-10-CM | POA: Insufficient documentation

## 2015-08-30 DIAGNOSIS — Z8669 Personal history of other diseases of the nervous system and sense organs: Secondary | ICD-10-CM | POA: Diagnosis not present

## 2015-08-30 DIAGNOSIS — Z951 Presence of aortocoronary bypass graft: Secondary | ICD-10-CM | POA: Diagnosis not present

## 2015-08-30 DIAGNOSIS — Y998 Other external cause status: Secondary | ICD-10-CM | POA: Diagnosis not present

## 2015-08-30 DIAGNOSIS — IMO0002 Reserved for concepts with insufficient information to code with codable children: Secondary | ICD-10-CM

## 2015-08-30 DIAGNOSIS — Y9289 Other specified places as the place of occurrence of the external cause: Secondary | ICD-10-CM | POA: Diagnosis not present

## 2015-08-30 DIAGNOSIS — Z8719 Personal history of other diseases of the digestive system: Secondary | ICD-10-CM | POA: Diagnosis not present

## 2015-08-30 DIAGNOSIS — Y9389 Activity, other specified: Secondary | ICD-10-CM | POA: Insufficient documentation

## 2015-08-30 DIAGNOSIS — S32010A Wedge compression fracture of first lumbar vertebra, initial encounter for closed fracture: Secondary | ICD-10-CM | POA: Diagnosis not present

## 2015-08-30 DIAGNOSIS — E785 Hyperlipidemia, unspecified: Secondary | ICD-10-CM | POA: Insufficient documentation

## 2015-08-30 DIAGNOSIS — Z7952 Long term (current) use of systemic steroids: Secondary | ICD-10-CM | POA: Diagnosis not present

## 2015-08-30 DIAGNOSIS — W108XXA Fall (on) (from) other stairs and steps, initial encounter: Secondary | ICD-10-CM | POA: Insufficient documentation

## 2015-08-30 DIAGNOSIS — I2510 Atherosclerotic heart disease of native coronary artery without angina pectoris: Secondary | ICD-10-CM | POA: Insufficient documentation

## 2015-08-30 DIAGNOSIS — Z7902 Long term (current) use of antithrombotics/antiplatelets: Secondary | ICD-10-CM | POA: Diagnosis not present

## 2015-08-30 DIAGNOSIS — W19XXXA Unspecified fall, initial encounter: Secondary | ICD-10-CM

## 2015-08-30 DIAGNOSIS — I10 Essential (primary) hypertension: Secondary | ICD-10-CM | POA: Insufficient documentation

## 2015-08-30 DIAGNOSIS — M1990 Unspecified osteoarthritis, unspecified site: Secondary | ICD-10-CM | POA: Insufficient documentation

## 2015-08-30 DIAGNOSIS — Z79899 Other long term (current) drug therapy: Secondary | ICD-10-CM | POA: Diagnosis not present

## 2015-08-30 DIAGNOSIS — Z950 Presence of cardiac pacemaker: Secondary | ICD-10-CM | POA: Insufficient documentation

## 2015-08-30 DIAGNOSIS — S3992XA Unspecified injury of lower back, initial encounter: Secondary | ICD-10-CM | POA: Diagnosis present

## 2015-08-30 DIAGNOSIS — I4891 Unspecified atrial fibrillation: Secondary | ICD-10-CM | POA: Diagnosis not present

## 2015-08-30 DIAGNOSIS — S32030A Wedge compression fracture of third lumbar vertebra, initial encounter for closed fracture: Secondary | ICD-10-CM | POA: Diagnosis not present

## 2015-08-30 DIAGNOSIS — M545 Low back pain: Secondary | ICD-10-CM | POA: Diagnosis not present

## 2015-08-30 DIAGNOSIS — S22070A Wedge compression fracture of T9-T10 vertebra, initial encounter for closed fracture: Secondary | ICD-10-CM | POA: Diagnosis not present

## 2015-08-30 DIAGNOSIS — S22080A Wedge compression fracture of T11-T12 vertebra, initial encounter for closed fracture: Secondary | ICD-10-CM | POA: Diagnosis not present

## 2015-08-30 DIAGNOSIS — S32040A Wedge compression fracture of fourth lumbar vertebra, initial encounter for closed fracture: Secondary | ICD-10-CM | POA: Diagnosis not present

## 2015-08-30 DIAGNOSIS — M533 Sacrococcygeal disorders, not elsewhere classified: Secondary | ICD-10-CM | POA: Diagnosis not present

## 2015-08-30 LAB — CBC WITH DIFFERENTIAL/PLATELET
Basophils Absolute: 0 10*3/uL (ref 0.0–0.1)
Basophils Relative: 0 %
EOS PCT: 0 %
Eosinophils Absolute: 0 10*3/uL (ref 0.0–0.7)
HEMATOCRIT: 35.2 % — AB (ref 36.0–46.0)
Hemoglobin: 11.5 g/dL — ABNORMAL LOW (ref 12.0–15.0)
LYMPHS ABS: 0.4 10*3/uL — AB (ref 0.7–4.0)
LYMPHS PCT: 5 %
MCH: 30.9 pg (ref 26.0–34.0)
MCHC: 32.7 g/dL (ref 30.0–36.0)
MCV: 94.6 fL (ref 78.0–100.0)
Monocytes Absolute: 0.3 10*3/uL (ref 0.1–1.0)
Monocytes Relative: 4 %
Neutro Abs: 7.3 10*3/uL (ref 1.7–7.7)
Neutrophils Relative %: 91 %
PLATELETS: 233 10*3/uL (ref 150–400)
RBC: 3.72 MIL/uL — AB (ref 3.87–5.11)
RDW: 12.7 % (ref 11.5–15.5)
WBC: 8 10*3/uL (ref 4.0–10.5)

## 2015-08-30 LAB — BASIC METABOLIC PANEL
Anion gap: 10 (ref 5–15)
BUN: 28 mg/dL — AB (ref 6–20)
CALCIUM: 9.3 mg/dL (ref 8.9–10.3)
CHLORIDE: 100 mmol/L — AB (ref 101–111)
CO2: 24 mmol/L (ref 22–32)
Creatinine, Ser: 1.03 mg/dL — ABNORMAL HIGH (ref 0.44–1.00)
GFR calc Af Amer: 55 mL/min — ABNORMAL LOW (ref >60)
GFR, EST NON AFRICAN AMERICAN: 47 mL/min — AB (ref >60)
GLUCOSE: 173 mg/dL — AB (ref 65–99)
POTASSIUM: 4.7 mmol/L (ref 3.5–5.1)
Sodium: 134 mmol/L — ABNORMAL LOW (ref 135–145)

## 2015-08-30 MED ORDER — HYDROCODONE-ACETAMINOPHEN 5-325 MG PO TABS
1.0000 | ORAL_TABLET | Freq: Once | ORAL | Status: AC
  Administered 2015-08-30: 1 via ORAL
  Filled 2015-08-30: qty 1

## 2015-08-30 MED ORDER — METOPROLOL TARTRATE 25 MG PO TABS
50.0000 mg | ORAL_TABLET | Freq: Once | ORAL | Status: AC
  Administered 2015-08-30: 50 mg via ORAL
  Filled 2015-08-30: qty 2

## 2015-08-30 NOTE — ED Provider Notes (Signed)
CSN: [id number]     Arrival date & time 08/30/15  1326 History   First MD Initiated Contact with Patient 08/30/15 1516     Chief Complaint  Patient presents with  . Back Pain     (Consider location/radiation/quality/duration/timing/severity/associated sxs/prior Treatment) HPI   11 y F w PMH afib, cad, palpitations, PPM, multiple spinal fractures who comes in with back pain.  Back pain is chronic but 2.5 weeks ago she had a fall where she fell on her bottom down three steps.  For the past few days she has had worsening of her back pain.  The pain she is feeling now is in her lower back.  Past Medical History  Diagnosis Date  . Coronary artery disease 2007    3 vessel CABG with LIMA-LAD, SVG to diag, and SVG - Ramus  . Hyperlipidemia   . Hypertension   . History of atrial fibrillation; review of all ECG are most consistent with atrial tachycardia     had rash with amiodarone; no anticoagulation due to GI bleed in December 2012  . History of hyperkalemia   . Hyponatremia   . Diverticulitis   . Palpitations   . History of GI bleed Dec 2012  . Hearing loss of left ear 07/2013  . GI bleeding 09/09/2013  . Arthritis     osteo   in back  . Pacemaker-[employer]tr[employer] 02/09/2012   Past Surgical History  Procedure Laterality Date  . [place]    . Coronary artery bypass graft  Feb 2007    Dr. Cyndia Bent;   Marland Kitchen Partial hysterectomy    . Esophagogastroduodenoscopy  08/04/2011    Procedure: ESOPHAGOGASTRODUODENOSCOPY (EGD);  Surgeon: Cleotis Nipper, MD;  Location[facility] ENDOSCOPY;  Service: Endoscopy;  Laterality: N/A;  do at bedside  . Abdominal hysterectomy    . Psychologist, forensic    . Esophagogastroduodenoscopy N/A 09/11/2013    Procedure: ESOPHAGOGASTRODUODENOSCOPY (EGD);  Surgeon: Jeryl Columbia, MD;  Location: [facility] ENDOSCOPY;  Service: Endoscopy;  Laterality: N/A;  . Colonoscopy N/A 09/12/2013    Procedure: COLONOSCOPY;  Surgeon: Wonda Horner, MD;  Location: [facility] ENDOSCOPY;  Service: Endoscopy;  Laterality: N/A;   . Permanent pacemaker insertion N/A 02/08/2012    Procedure: PERMANENT PACEMAKER INSERTION;  Surgeon: Deboraha Sprang, MD;  Location: [facility] CATH LAB;  Service: Cardiovascular;  Laterality: N/A;   Family History  Problem Relation Age of Onset  . Heart failure Father   . Kidney failure Father   . Bone cancer Brother   . Kidney failure Sister   . Colon cancer Sister   . Cancer Sister    Social History  Substance Use Topics  . Smoking status: Never Smoker   . Smokeless tobacco: Never Used  . Alcohol Use: No   OB History    No data available     Review of Systems  Constitutional: Negative for fever and chills.  HENT: Negative for nosebleeds.   Eyes: Negative for visual disturbance.  Respiratory: Negative for cough and shortness of breath.   Cardiovascular: Negative for chest pain.  Gastrointestinal: Negative for nausea, vomiting, abdominal pain, diarrhea and constipation.  Genitourinary: Negative for dysuria.  Musculoskeletal: Positive for back pain.  Skin: Negative for rash.  Neurological: Negative for weakness.  All other systems reviewed and are negative.     Allergies  Amiodarone; Aspirin; Lipitor; Nsaids; Tolmetin; Naproxen; Niacin; and Niaspan  Home Medications   Prior to Admission medications   Medication Sig Start Date End Date Taking? Authorizing Provider  alendronate (FOSAMAX) 70 MG tablet TAKE 1 TABLET (70 MG TOTAL) BY MOUTH EVERY SEVEN   DAYS. TAKE WITH A FULL GLASS OF WATER ON AT NOON TIME   EMPTY STOMACH. Patient taking differently: TAKE 1 TABLET (70 MG TOTAL) BY MOUTH EVERY SEVEN  DAYS ON THURSDAY. TAKE WITH A FULL GLASS OF WATER ON AT NOON TIME   EMPTY STOMACH. 08/21/14  Yes Lanice Shirts, MD  apixaban (ELIQUIS) 2.5 MG TABS tablet Take 1 tablet (2.5 mg total) by mouth 2 (two) times daily. 03/02/15  Yes Thayer Headings, MD  Calcium Carbonate-Vitamin D (CALTRATE 600+D PO) Take 1 tablet by mouth daily.    Yes Historical Provider, MD  diltiazem (CARDIZEM CD)  240 MG 24 hr capsule TAKE 1 CAPSULE (240 MG TOTAL) BY MOUTH DAILY. 06/05/15  Yes Thayer Headings, MD  diphenhydramine-acetaminophen (TYLENOL PM) 25-500 MG TABS tablet Take 2 tablets by mouth at bedtime.   Yes Historical Provider, MD  HYDROcodone-acetaminophen (NORCO/VICODIN) 5-325 MG tablet Take 1 tablet by mouth daily as needed for moderate pain.  08/27/15  Yes Historical Provider, MD  lisinopril (PRINIVIL,ZESTRIL) 40 MG tablet Take 40 mg by mouth daily.   Yes Historical Provider, MD  metoprolol (LOPRESSOR) 50 MG tablet Take 25 mg by mouth 2 (two) times daily.   Yes Historical Provider, MD  Multiple Vitamins-Minerals (CENTRUM SILVER ADULT 50+ PO) Take 1 tablet by mouth daily.   Yes Historical Provider, MD  pantoprazole (PROTONIX) 40 MG tablet TAKE 1 TABLET (40 MG TOTAL) BY MOUTH DAILY. 09/17/14  Yes Lanice Shirts, MD  pravastatin (PRAVACHOL) 80 MG tablet TAKE 1 TABLET (80 MG TOTAL) BY MOUTH DAILY. 05/25/15  Yes Thayer Headings, MD  predniSONE (DELTASONE) 5 MG tablet Take 5 mg by mouth daily. 08/28/15  Yes Historical Provider, MD  cephALEXin (KEFLEX) 500 MG capsule Take 1 capsule (500 mg total) by mouth 2 (two) times daily. 08/12/15   Quintella Reichert, MD  triamterene-hydrochlorothiazide (MAXZIDE-25) 37.5-25 MG per tablet TAKE ONE WHOLE TABLET BY MOUTH EVERY OTHER DAY 10/26/14   Lanice Shirts, MD   BP 118/89 mmHg  Pulse 122  Temp(Src) 99.2 F (37.3 C) (Oral)  Resp 26  Ht 5\' 3"  (1.6 m)  Wt 60.782 kg  BMI 23.74 kg/m2  SpO2 96% Physical Exam  Constitutional: She is oriented to person, place, and time. No distress.  HENT:  Head: Normocephalic and atraumatic.  Eyes: EOM are normal. Pupils are equal, round, and reactive to light.  Neck: Normal range of motion. Neck supple.  Cardiovascular: Normal rate and intact distal pulses.   Pulmonary/Chest: No respiratory distress.  Abdominal: Soft. There is no tenderness.  Musculoskeletal: Normal range of motion.  ttp over the lower L spine and  sacrum Intact strength/sensation in bilateral lower extremities  Neurological: She is alert and oriented to person, place, and time.  Skin: No rash noted. She is not diaphoretic.  Psychiatric: She has a normal mood and affect.    ED Course  Procedures (including critical care time) Labs Review Labs Reviewed  CBC WITH DIFFERENTIAL/PLATELET - Abnormal; Notable for the following:    RBC 3.72 (*)    Hemoglobin 11.5 (*)    HCT 35.2 (*)    Lymphs Abs 0.4 (*)    All other components within normal limits  BASIC METABOLIC PANEL - Abnormal; Notable for the following:    Sodium 134 (*)    Chloride 100 (*)    Glucose, Bld 173 (*)    BUN 28 (*)  Creatinine, Ser 1.03 (*)    GFR calc non Af Amer 47 (*)    GFR calc Af Amer 55 (*)    All other components within normal limits    Imaging Review Dg Lumbar Spine Complete  08/30/2015  CLINICAL DATA:  Fall 2 weeks ago down steps with back pain worsening. EXAM: LUMBAR SPINE - COMPLETE 4+ VIEW COMPARISON:  Lumbar spine 08/05/2011 and chest x-ray 10/16/2013 FINDINGS: Vertebral body alignment is within normal. There is spondylosis throughout the visualized thoracic and lumbar spine. Mild disc space narrowing at the L2-3, L3-4 L4-5 levels. Subtle grade 1 anterolisthesis of L4 on L5 unchanged. L1 compression fracture post kyphoplasty unchanged. Severe L3 compression fracture unchanged. Mild loss of height of L4 new since the previous exams, but age indeterminate. Mild stable T12 compression fracture. Moderate to severe T10 compression fracture unchanged. Possible mild T8 compression deformity new since the previous exams but age indeterminate. Calcified plaque over the abdominal aorta and iliac arteries. IMPRESSION: Severe compression fractures of T10, L1 post kyphoplasty at L3 unchanged. Mild L4 compression deformity and possible mild T8 compression deformity new since the previous exams, but age indeterminate. Mild spondylosis throughout the visualized  thoracolumbar spine with disc disease over the lumbar spine as described unchanged. Mild grade 1 anterolisthesis of L4 on L5 unchanged. Electronically Signed   By: Marin Olp M.D.   On: 08/30/2015 15:16   Dg Sacrum/coccyx  08/30/2015  CLINICAL DATA:  Pain following fall 2 weeks prior with progressive pain EXAM: SACRUM AND COCCYX - 2+ VIEW COMPARISON:  August 05, 2011 FINDINGS: Frontal and lateral views obtained. There is no demonstrable sacrum or coccyx fracture or diastases. Marked collapse of the L3 vertebral body is stable. Mild anterolisthesis of L4 on L5 is stable. There is mild osteoarthritic change in each sacroiliac joint. There are foci of atherosclerotic vascular calcification in the aorta and common iliac arteries. IMPRESSION: Chronic collapse of the L3 vertebral body, stable from 2012. Mild spondylolisthesis at L4-5 is likewise stable. No sacrum or coccyx fracture or diastases. Osteoarthritic change in each sacroiliac joint is noted. Electronically Signed   By: Lowella Grip III M.D.   On: 08/30/2015 15:13   Ct Thoracic Spine Wo Contrast  08/30/2015  CLINICAL DATA:  Progressive low back and hip pain after falling down steps 3 weeks ago. History of thoracolumbar compression deformities and spinal augmentation at L1. EXAM: CT THORACIC AND LUMBAR SPINE WITHOUT CONTRAST TECHNIQUE: Multidetector CT imaging of the thoracic and lumbar spine was performed without contrast. Multiplanar CT image reconstructions were also generated. COMPARISON:  Chest CT 10/25/2013, lumbar spine radiographs 08/05/2011 and 08/30/2015 and cervical spine CT 08/12/2015. FINDINGS: CT THORACIC SPINE FINDINGS There is a mild convex right scoliosis. The lateral alignment is normal. No acute findings are seen within the upper thoracic spine. There is a Schmorl's node involving the superior endplate of T7 which does not appear acute. There is a severe biconcave T8 fracture resulting in 75% loss vertebral body height and 5 mm  of osseous retropulsion. The pedicles are intact. There is no subluxation. This vertebral body is sclerotic, probably related to partial fracture healing. No surrounding paraspinal hematoma identified. There is vacuum disc phenomenon at T9-10. This fracture is new compared with the chest CT of 2 years ago. Severe biconcave compression fracture at T10 is unchanged from the 2012 radiographs. There is minimal osseous retropulsion which appears unchanged. There is a stable chronic Schmorl's node involving the superior endplate of 624THL. Cardiomegaly, cardiac pacemaker, atherosclerosis  and chronic lung disease are noted. CT LUMBAR SPINE FINDINGS There is 5 mm of anterolisthesis at L4-5 secondary to facet disease. The alignment is otherwise normal. There is no evidence of pars defect. Chronic L1 compression fracture appears unchanged status post spinal augmentation. There is approximately 60% loss of vertebral body height and chronic osseous retropulsion. The pedicles appears intact. L2 appears intact. There is a severe chronic burst fracture at L3 which appears unchanged from the 2012 radiographs. There is greater than 75% loss vertebral body height and approximately 6 mm of osseous retropulsion. The osseous retropulsion results in moderate mass effect on the thecal sac. There is also moderate multifactorial spinal stenosis at the L2-3 and L3-4 disc space levels due to disc bulging, facet and ligamentous hypertrophy. There is a mild superior endplate compression fracture at L4 which is associated with sharp cortical margins and a small amount of paraspinal hemorrhage, suggesting acuity. This fracture demonstrates no significant osseous retropulsion. There is less than 25% loss of vertebral body height. L5 and the upper sacrum are intact. There is severe multifactorial spinal stenosis at L4-5 due to disc bulging, facet and ligamentous hypertrophy. IMPRESSION: 1. No definite acute findings in the thoracic spine. 2. Severe  biconcave T8 fracture is new from 2012, but sclerotic and without paraspinal changes to suggest acuity. This fracture is associated with osseous retropulsion. 3. Superior endplate compression deformity at L4 appears acute. 4. Stable chronic fractures at L1 and L3. 5. Degenerative disc disease, facet and ligamentous hypertrophy contribute to multifactorial spinal stenosis in the lower lumbar spine, greatest at L4-5 where it is severe. Electronically Signed   By: Richardean Sale M.D.   On: 08/30/2015 17:41   Ct Lumbar Spine Wo Contrast  08/30/2015  CLINICAL DATA:  Progressive low back and hip pain after falling down steps 3 weeks ago. History of thoracolumbar compression deformities and spinal augmentation at L1. EXAM: CT THORACIC AND LUMBAR SPINE WITHOUT CONTRAST TECHNIQUE: Multidetector CT imaging of the thoracic and lumbar spine was performed without contrast. Multiplanar CT image reconstructions were also generated. COMPARISON:  Chest CT 10/25/2013, lumbar spine radiographs 08/05/2011 and 08/30/2015 and cervical spine CT 08/12/2015. FINDINGS: CT THORACIC SPINE FINDINGS There is a mild convex right scoliosis. The lateral alignment is normal. No acute findings are seen within the upper thoracic spine. There is a Schmorl's node involving the superior endplate of T7 which does not appear acute. There is a severe biconcave T8 fracture resulting in 75% loss vertebral body height and 5 mm of osseous retropulsion. The pedicles are intact. There is no subluxation. This vertebral body is sclerotic, probably related to partial fracture healing. No surrounding paraspinal hematoma identified. There is vacuum disc phenomenon at T9-10. This fracture is new compared with the chest CT of 2 years ago. Severe biconcave compression fracture at T10 is unchanged from the 2012 radiographs. There is minimal osseous retropulsion which appears unchanged. There is a stable chronic Schmorl's node involving the superior endplate of 624THL.  Cardiomegaly, cardiac pacemaker, atherosclerosis and chronic lung disease are noted. CT LUMBAR SPINE FINDINGS There is 5 mm of anterolisthesis at L4-5 secondary to facet disease. The alignment is otherwise normal. There is no evidence of pars defect. Chronic L1 compression fracture appears unchanged status post spinal augmentation. There is approximately 60% loss of vertebral body height and chronic osseous retropulsion. The pedicles appears intact. L2 appears intact. There is a severe chronic burst fracture at L3 which appears unchanged from the 2012 radiographs. There is greater than 75%  loss vertebral body height and approximately 6 mm of osseous retropulsion. The osseous retropulsion results in moderate mass effect on the thecal sac. There is also moderate multifactorial spinal stenosis at the L2-3 and L3-4 disc space levels due to disc bulging, facet and ligamentous hypertrophy. There is a mild superior endplate compression fracture at L4 which is associated with sharp cortical margins and a small amount of paraspinal hemorrhage, suggesting acuity. This fracture demonstrates no significant osseous retropulsion. There is less than 25% loss of vertebral body height. L5 and the upper sacrum are intact. There is severe multifactorial spinal stenosis at L4-5 due to disc bulging, facet and ligamentous hypertrophy. IMPRESSION: 1. No definite acute findings in the thoracic spine. 2. Severe biconcave T8 fracture is new from 2012, but sclerotic and without paraspinal changes to suggest acuity. This fracture is associated with osseous retropulsion. 3. Superior endplate compression deformity at L4 appears acute. 4. Stable chronic fractures at L1 and L3. 5. Degenerative disc disease, facet and ligamentous hypertrophy contribute to multifactorial spinal stenosis in the lower lumbar spine, greatest at L4-5 where it is severe. Electronically Signed   By: Richardean Sale M.D.   On: 08/30/2015 17:41   I have personally  reviewed and evaluated these images and lab results as part of my medical decision-making.   EKG Interpretation None      MDM   Final diagnoses:  Fall    89 y F w PMH afib, cad, palpitations, PPM, multiple spinal fractures who comes in with back pain.  Exam as above.  Some ttp in the lower back but no neuro deficits  Will obtain basic labs and xray of the lower back.  Cbc/bmp unremarkable.  Xray seems to show possible new compression frx.  Will obtain ct t and l spine to further evaluate  Ct of t/l spine show multiple old compression frx and acute compression of L4.  Spoke with on-call ortho for [facility] who recommend TLSO and close f/u this week.  TLSO applied and will have her f/u  I have discussed the results, Dx and Tx plan with the patient. They expressed understanding and agree with the plan and were told to return to ED with any worsening of condition or concern.    Disposition: Discharge  Condition: Good  Discharge Medication List as of 08/30/2015  8:33 PM      Follow Up: Latanya Maudlin, MD [address] [place] [address] [place] [address] [phone]  In 3 days    Pt seen in conjunction with Dr. Donnella Bi, MD 08/31/15 0003  Carmin Muskrat, MD 08/31/15 [phone]

## 2015-08-30 NOTE — ED Notes (Signed)
Patient transported to CT 

## 2015-08-30 NOTE — ED Notes (Signed)
Per pts son the pt had a fall on 08/10/15 down 3 carpeted steps & was evaluated here, pt has had back pain since that time with hx of osteo arthritis & osteoperosis, pt seen by her orthopedic MD before the fall & told to resume wearing her back  Brace, pt presents today with worsening back pain, ambulatory with pain, pt in back brace, pt reported to also have hyponatremia, pt A&O x4, pt denies bowel & bladder incontinence

## 2015-08-30 NOTE — ED Notes (Signed)
Ortho at bedside.

## 2015-08-30 NOTE — Discharge Instructions (Signed)
Please wear brace at all times while up   Thoracolumbosacral Orthosis Brace A thoracolumbosacral orthosis (TLSO) brace can be worn for different purposes. A TLSO brace can be worn to support the back. Your back may need more support if you had a broken bone in your back (fractured vertebrae), back surgery, or you cannot move (paralysis) your torso muscles. A TLSO brace can also be worn by a child to help prevent curving back problems from getting worse as the child grows. TLSO braces are used to limit bending and twisting. The braces are made from a hard plastic. They are custom fit for each wearer. The TLSO brace covers both the front and back of the torso. On the back, the brace reaches from just above the tailbone to just below the shoulder blades. On the front, the brace covers the ribs and often reaches past the waist and over the hips. The brace can be worn under clothing.  HOME CARE INSTRUCTIONS  Ask your caregiver if you can remove your brace when showering or sleeping.  Follow your caregiver's instructions for putting on your brace.  Follow your caregiver's instructions about how much weight you can lift.  Always wear a T-shirt under the brace.  Make sure the brace is always well-aligned and fits you closely.  Check your skin daily for sores and redness caused by rubbing or pressure.  If you feel unsteady, use a cane or walker for support until you feel more steady.  Sit in high, firm chairs. It may be difficult to stand up from low, soft chairs.  Keep all follow-up appointments as directed by your caregiver. SEEK IMMEDIATE MEDICAL CARE IF:  You have a fever.  You have shortness of breath.  You have chest pain.  You have numbness, tingling, or pain. Developed in conjunction with the [employer] at [facility].   This information is not intended to replace advice given to you by your health care provider. Make sure you discuss  any questions you have with your health care provider.   Document Released: 07/21/2011 Document Revised: 10/24/2011 Document Reviewed: 07/21/2011 Elsevier Interactive Patient Education [employer].

## 2015-08-30 NOTE — ED Notes (Signed)
[employer] tech rep is aware of brace need; states it will be about an hour.

## 2015-08-30 NOTE — ED Notes (Signed)
Pt left without having vitals due to "tired of waiting" ambulatory with cane, resp e/u, nad noted, gait steady, skin warm and dry, abc intact.

## 2015-08-30 NOTE — ED Notes (Signed)
Patient returned from CT

## 2015-09-03 ENCOUNTER — Ambulatory Visit: Payer: Medicare HMO | Admitting: Cardiovascular Disease

## 2015-09-03 DIAGNOSIS — S22060D Wedge compression fracture of T7-T8 vertebra, subsequent encounter for fracture with routine healing: Secondary | ICD-10-CM | POA: Diagnosis not present

## 2015-09-18 DIAGNOSIS — I10 Essential (primary) hypertension: Secondary | ICD-10-CM | POA: Diagnosis not present

## 2015-09-18 DIAGNOSIS — E871 Hypo-osmolality and hyponatremia: Secondary | ICD-10-CM | POA: Diagnosis not present

## 2015-09-24 DIAGNOSIS — S32030D Wedge compression fracture of third lumbar vertebra, subsequent encounter for fracture with routine healing: Secondary | ICD-10-CM | POA: Diagnosis not present

## 2015-09-24 DIAGNOSIS — S22060D Wedge compression fracture of T7-T8 vertebra, subsequent encounter for fracture with routine healing: Secondary | ICD-10-CM | POA: Diagnosis not present

## 2015-09-28 ENCOUNTER — Ambulatory Visit (INDEPENDENT_AMBULATORY_CARE_PROVIDER_SITE_OTHER): Payer: Medicare HMO | Admitting: Cardiovascular Disease

## 2015-09-28 ENCOUNTER — Encounter: Payer: Self-pay | Admitting: Cardiovascular Disease

## 2015-09-28 VITALS — BP 122/70 | HR 70 | Ht 63.0 in | Wt 134.6 lb

## 2015-09-28 DIAGNOSIS — I2510 Atherosclerotic heart disease of native coronary artery without angina pectoris: Secondary | ICD-10-CM | POA: Diagnosis not present

## 2015-09-28 DIAGNOSIS — I480 Paroxysmal atrial fibrillation: Secondary | ICD-10-CM | POA: Diagnosis not present

## 2015-09-28 DIAGNOSIS — E785 Hyperlipidemia, unspecified: Secondary | ICD-10-CM

## 2015-09-28 LAB — LIPID PANEL
CHOL/HDL RATIO: 2.4 ratio (ref <=5.0)
CHOLESTEROL: 193 mg/dL (ref 125–200)
HDL: 81 mg/dL (ref >=46)
LDL Cholesterol: 98 mg/dL (ref <130)
Triglycerides: 69 mg/dL (ref <150)
VLDL: 14 mg/dL (ref <30)

## 2015-09-28 NOTE — Patient Instructions (Signed)

## 2015-09-28 NOTE — Progress Notes (Signed)
Donna Ortega Date of Birth  1926/08/11 [place] [address]    [place]   [place], [place]  [address]    [place], [place]  [address] [phone]  Fax  [phone]  [phone]  Fax [phone]  Problems:  1. CAD - s/p CABG 2007, 2. PACs 3. Drug Rash - amiodarone 4. GI Bleed 5. Anemia 6. hyponatremia   History of Present Illness:  Donna Ortega is an 82 year old female with a history of coronary artery disease and atrial fibrillation. She was admitted to the hospital in December with a GI bleed, anemia, and rapid atrial fibrillation. She was discharged with the additional medications of amiodarone and Cardizem CD.  She was seen in mid January and was doing fairly well.  She stopped her Amiodarone due to drug rash.  She still has generalized fatigue and generalized weakness. She wonders if it may be due to the Lasix and potassium.  April 17, 2012 She has been more fatigued recently.  She played golf this past weekend and still is not over the exertion.  Dec. 3, 2013 :  She is doing well.  Dr. Caryl Comes stopped her Pradaxa due to GI bleed.    She is doing very well.   January 15, 2013:  Naw is doing OK.  She does not have as much energy as she would like.   She has continued to have problems with hyponatremia.  She is on maxzide which is likely exacerbating this.  No CP, no dyspnea.    Dec. 1, 2014:  Donna Ortega is doing well.  No CP or dyspnea.  Rhythm is stable.   Her Hb was a bit low when last checked by her medical doctor.  Has not started on the iron tabs.   No CP, not playing golf any more because of back pain ( s/p kyphoplasty)  .  She is still walking around the block on occasion.   She is now in her own apartment.   Sept. 9, 2015:  Donna Ortega is doing well.  She is not having any problems   March 02, 2015: No CP , no dyspnea.   Feb. 13, 2017:  Fell 3 weeks ago .  Now is using a cane for stability  She has occasional episodes  of atrial fib - last 5 minutes or so.   Is on Eliquis 2.5 BID  Has had some bladder issues     Current Outpatient Prescriptions on File Prior to Visit  Medication Sig Dispense Refill  . alendronate (FOSAMAX) 70 MG tablet TAKE 1 TABLET (70 MG TOTAL) BY MOUTH EVERY SEVEN   DAYS. TAKE WITH A FULL GLASS OF WATER ON AT NOON TIME   EMPTY STOMACH. (Patient taking differently: TAKE 1 TABLET (70 MG TOTAL) BY MOUTH EVERY SEVEN  DAYS ON THURSDAY. TAKE WITH A FULL GLASS OF WATER ON AT NOON TIME   EMPTY STOMACH.) 12 tablet 1  . apixaban (ELIQUIS) 2.5 MG TABS tablet Take 1 tablet (2.5 mg total) by mouth 2 (two) times daily. 60 tablet 11  . Calcium Carbonate-Vitamin D (CALTRATE 600+D PO) Take 1 tablet by mouth daily.     . cephALEXin (KEFLEX) 500 MG capsule Take 1 capsule (500 mg total) by mouth 2 (two) times daily. 28 capsule 0  . diltiazem (CARDIZEM CD) 240 MG 24 hr capsule TAKE 1 CAPSULE (240 MG TOTAL) BY MOUTH DAILY. 30 capsule 5  . diphenhydramine-acetaminophen (TYLENOL PM)  25-500 MG TABS tablet Take 2 tablets by mouth at bedtime.    Marland Kitchen HYDROcodone-acetaminophen (NORCO/VICODIN) 5-325 MG tablet Take 1 tablet by mouth daily as needed for moderate pain.     Marland Kitchen lisinopril (PRINIVIL,ZESTRIL) 40 MG tablet Take 20 mg by mouth daily.     . metoprolol (LOPRESSOR) 50 MG tablet Take 25 mg by mouth 2 (two) times daily.    . Multiple Vitamins-Minerals (CENTRUM SILVER ADULT 50+ PO) Take 1 tablet by mouth daily.    . pantoprazole (PROTONIX) 40 MG tablet TAKE 1 TABLET (40 MG TOTAL) BY MOUTH DAILY. 90 tablet 1  . pravastatin (PRAVACHOL) 80 MG tablet TAKE 1 TABLET (80 MG TOTAL) BY MOUTH DAILY. 90 tablet 0  . predniSONE (DELTASONE) 5 MG tablet Take 5 mg by mouth daily.    Marland Kitchen triamterene-hydrochlorothiazide (MAXZIDE-25) 37.5-25 MG per tablet TAKE ONE WHOLE TABLET BY MOUTH EVERY OTHER DAY 15 tablet 2   No current facility-administered medications on file prior to visit.    Allergies  Allergen Reactions  . Amiodarone Hives     ALL OVER BODY HIVES/ ITCH   . Aspirin Other (See Comments)    Gi bleed   . Lipitor [Atorvastatin Calcium] Other (See Comments)    MUSCLE ACHES  . Nsaids Other (See Comments)    GI bleed  . Tolmetin Other (See Comments)    GI bleed  . Naproxen Other (See Comments)  . Niacin Rash  . Niaspan [Niacin Er] Rash    Past Medical History  Diagnosis Date  . Coronary artery disease 2007    3 vessel CABG with LIMA-LAD, SVG to diag, and SVG - Ramus  . Hyperlipidemia   . Hypertension   . History of atrial fibrillation; review of all ECG are most consistent with atrial tachycardia     had rash with amiodarone; no anticoagulation due to GI bleed in December 2012  . History of hyperkalemia   . Hyponatremia   . Diverticulitis   . Palpitations   . History of GI bleed Dec 2012  . Hearing loss of left ear 07/2013  . GI bleeding 09/09/2013  . Arthritis     osteo   in back  . Pacemaker-Medtronic 02/09/2012    Past Surgical History  Procedure Laterality Date  . Maze    . Coronary artery bypass graft  Feb 2007    Dr. Cyndia Bent;   Marland Kitchen Partial hysterectomy    . Esophagogastroduodenoscopy  08/04/2011    Procedure: ESOPHAGOGASTRODUODENOSCOPY (EGD);  Surgeon: Cleotis Nipper, MD;  Location[facility] ENDOSCOPY;  Service: Endoscopy;  Laterality: N/A;  do at bedside  . Abdominal hysterectomy    . Psychologist, forensic    . Esophagogastroduodenoscopy N/A 09/11/2013    Procedure: ESOPHAGOGASTRODUODENOSCOPY (EGD);  Surgeon: Jeryl Columbia, MD;  Location: [facility] ENDOSCOPY;  Service: Endoscopy;  Laterality: N/A;  . Colonoscopy N/A 09/12/2013    Procedure: COLONOSCOPY;  Surgeon: Wonda Horner, MD;  Location: [facility] ENDOSCOPY;  Service: Endoscopy;  Laterality: N/A;  . Permanent pacemaker insertion N/A 02/08/2012    Procedure: PERMANENT PACEMAKER INSERTION;  Surgeon: Deboraha Sprang, MD;  Location[facility] CATH LAB;  Service: Cardiovascular;  Laterality: N/A;    History  Smoking status  . Never Smoker   Smokeless tobacco  . Never Used     History  Alcohol Use No    Family History  Problem Relation Age of Onset  . Heart failure Father   . Kidney failure Father   . Bone cancer Brother   .  Kidney failure Sister   . Colon cancer Sister   . Cancer Sister     Reviw of Systems:  Reviewed in the HPI.  All other systems are negative.  Physical Exam: Blood pressure 122/70, pulse 70, height 5\' 3"  (1.6 m), weight 134 lb 9.6 oz (61.054 kg). General: Well developed, well nourished, in no acute distress. Head: Normocephalic, atraumatic, sclera non-icteric, mucus membranes are moist,  Neck: Supple. Negative for carotid bruits. JVD not elevated. Lungs: Clear bilaterally to auscultation without wheezes, rales, or rhonchi. Breathing is unlabored. Heart: RR.   normal S1 S2.   Abdomen: Soft, non-tender, non-distended with normoactive bowel sounds. No hepatomegaly. No rebound/guarding. No obvious abdominal masses. Msk:  Strength and tone appear normal for age. Extremities: No clubbing or cyanosis. No edema.  Distal pedal pulses are 2+ and equal bilaterally. Neuro: Alert and oriented X 3. Moves all extremities spontaneously. Psych:  Responds to questions appropriately with a normal affect.   ECG:   Assessment / Plan:   1. CAD - s/p CABG 2007,   - no angina ,  Doing well , able to do all of her usual activities without issues    2. Atrial fib:  Has been on Eliquis. Has occasional episodes of PAF .     3. Drug Rash - amiodarone  4. GI Bleed  - will keep a close eye on her Hb since we are starting the Eliquis .   5. Anemia - will check Hb today .  6. Hyponatremia  7. Hyperlipidemia - will get fasting lipids today .  She had BMP and liver enz.  Recently    Xzavian Semmel, Wonda Cheng, MD  09/28/2015 10:55 AM    [address],  [place], [place]  [address] Pager [phone] Phone: [phone]; Fax: [phone]   [facility]  [address] [place], [place]  [address] [phone]    Fax [phone]

## 2015-10-08 ENCOUNTER — Ambulatory Visit (INDEPENDENT_AMBULATORY_CARE_PROVIDER_SITE_OTHER): Payer: Medicare HMO | Admitting: *Deleted

## 2015-10-08 DIAGNOSIS — I480 Paroxysmal atrial fibrillation: Secondary | ICD-10-CM | POA: Diagnosis not present

## 2015-10-08 DIAGNOSIS — I2510 Atherosclerotic heart disease of native coronary artery without angina pectoris: Secondary | ICD-10-CM | POA: Diagnosis not present

## 2015-10-08 NOTE — Patient Instructions (Addendum)
Pt was started on Eliquis   A full discussion of the nature of anticoagulants has been carried out.  A benefit/risk analysis has been presented to the patient, so that they understand the justification for choosing anticoagulation with Eliquis at this time.  The need for compliance is stressed.  Pt is aware to take the medication twice daily.  Side effects of potential bleeding are discussed, including unusual colored urine or stools, coughing up blood or coffee ground emesis, nose bleeds or serious fall or head trauma.  Discussed signs and symptoms of stroke. The patient should avoid any OTC items containing aspirin or ibuprofen.  Avoid alcohol consumption.   Call if any signs of abnormal bleeding.  Discussed financial obligations and resolved any difficulty in obtaining medication.  Next lab test test in 6 months.   No unusual bleeding or bruising No side effects from the eliquis

## 2015-10-27 ENCOUNTER — Ambulatory Visit (INDEPENDENT_AMBULATORY_CARE_PROVIDER_SITE_OTHER): Payer: Medicare HMO | Admitting: *Deleted

## 2015-10-27 ENCOUNTER — Telehealth: Payer: Self-pay | Admitting: Cardiology

## 2015-10-27 DIAGNOSIS — I495 Sick sinus syndrome: Secondary | ICD-10-CM | POA: Diagnosis not present

## 2015-10-27 NOTE — Telephone Encounter (Signed)
LMOVM reminding pt to send remote transmission.   

## 2015-10-27 NOTE — Progress Notes (Signed)
Remote pacemaker transmission.   

## 2015-11-06 LAB — CUP PACEART REMOTE DEVICE CHECK
Battery Voltage: 3 V
Brady Statistic AP VP Percent: 0.14 %
Brady Statistic RV Percent Paced: 1.11 %
Implantable Lead Implant Date: 20130626
Implantable Lead Location: 753859
Implantable Lead Model: 5086
Lead Channel Sensing Intrinsic Amplitude: 8.672 mV
Lead Channel Setting Pacing Amplitude: 2 V
Lead Channel Setting Pacing Pulse Width: 0.4 ms
Lead Channel Setting Sensing Sensitivity: 0.9 mV
MDC IDC LEAD IMPLANT DT: 20130626
MDC IDC LEAD LOCATION: 753860
MDC IDC LEAD MODEL: 5086
MDC IDC MSMT LEADCHNL RA IMPEDANCE VALUE: 408 Ohm
MDC IDC MSMT LEADCHNL RA SENSING INTR AMPL: 0.763 mV
MDC IDC MSMT LEADCHNL RV IMPEDANCE VALUE: 400 Ohm
MDC IDC SESS DTM: 20170314162521
MDC IDC SET LEADCHNL RV PACING AMPLITUDE: 2.5 V
MDC IDC STAT BRADY AP VS PERCENT: 45.76 %
MDC IDC STAT BRADY AS VP PERCENT: 0.97 %
MDC IDC STAT BRADY AS VS PERCENT: 53.14 %
MDC IDC STAT BRADY RA PERCENT PACED: 45.89 %

## 2015-11-09 ENCOUNTER — Encounter: Payer: Self-pay | Admitting: Cardiology

## 2015-11-10 ENCOUNTER — Other Ambulatory Visit: Payer: Self-pay | Admitting: Cardiovascular Disease

## 2015-11-16 DIAGNOSIS — R319 Hematuria, unspecified: Secondary | ICD-10-CM | POA: Diagnosis not present

## 2015-11-16 DIAGNOSIS — I10 Essential (primary) hypertension: Secondary | ICD-10-CM | POA: Diagnosis not present

## 2015-11-16 DIAGNOSIS — N183 Chronic kidney disease, stage 3 (moderate): Secondary | ICD-10-CM | POA: Diagnosis not present

## 2015-11-16 DIAGNOSIS — E871 Hypo-osmolality and hyponatremia: Secondary | ICD-10-CM | POA: Diagnosis not present

## 2016-01-02 ENCOUNTER — Other Ambulatory Visit: Payer: Self-pay | Admitting: Internal Medicine

## 2016-01-04 NOTE — Telephone Encounter (Signed)
Rx(s) sent to pharmacy electronically.  

## 2016-01-12 DIAGNOSIS — H903 Sensorineural hearing loss, bilateral: Secondary | ICD-10-CM | POA: Diagnosis not present

## 2016-01-12 DIAGNOSIS — H6121 Impacted cerumen, right ear: Secondary | ICD-10-CM | POA: Diagnosis not present

## 2016-01-12 DIAGNOSIS — J302 Other seasonal allergic rhinitis: Secondary | ICD-10-CM | POA: Diagnosis not present

## 2016-01-12 DIAGNOSIS — Z974 Presence of external hearing-aid: Secondary | ICD-10-CM | POA: Diagnosis not present

## 2016-02-12 ENCOUNTER — Other Ambulatory Visit: Payer: Self-pay | Admitting: Internal Medicine

## 2016-02-22 DIAGNOSIS — E871 Hypo-osmolality and hyponatremia: Secondary | ICD-10-CM | POA: Diagnosis not present

## 2016-02-22 DIAGNOSIS — I480 Paroxysmal atrial fibrillation: Secondary | ICD-10-CM | POA: Diagnosis not present

## 2016-02-22 DIAGNOSIS — N183 Chronic kidney disease, stage 3 (moderate): Secondary | ICD-10-CM | POA: Diagnosis not present

## 2016-02-22 DIAGNOSIS — E7800 Pure hypercholesterolemia, unspecified: Secondary | ICD-10-CM | POA: Diagnosis not present

## 2016-02-22 DIAGNOSIS — I10 Essential (primary) hypertension: Secondary | ICD-10-CM | POA: Diagnosis not present

## 2016-02-24 DIAGNOSIS — E871 Hypo-osmolality and hyponatremia: Secondary | ICD-10-CM | POA: Diagnosis not present

## 2016-02-24 DIAGNOSIS — I10 Essential (primary) hypertension: Secondary | ICD-10-CM | POA: Diagnosis not present

## 2016-02-29 DIAGNOSIS — E871 Hypo-osmolality and hyponatremia: Secondary | ICD-10-CM | POA: Diagnosis not present

## 2016-03-14 ENCOUNTER — Encounter: Payer: Self-pay | Admitting: Internal Medicine

## 2016-03-14 ENCOUNTER — Other Ambulatory Visit: Payer: Self-pay | Admitting: Cardiovascular Disease

## 2016-03-14 ENCOUNTER — Ambulatory Visit (INDEPENDENT_AMBULATORY_CARE_PROVIDER_SITE_OTHER): Payer: Medicare HMO | Admitting: Internal Medicine

## 2016-03-14 ENCOUNTER — Encounter (INDEPENDENT_AMBULATORY_CARE_PROVIDER_SITE_OTHER): Payer: Self-pay

## 2016-03-14 VITALS — BP 90/48 | HR 91 | Ht 60.5 in | Wt 136.0 lb

## 2016-03-14 DIAGNOSIS — I480 Paroxysmal atrial fibrillation: Secondary | ICD-10-CM

## 2016-03-14 DIAGNOSIS — I495 Sick sinus syndrome: Secondary | ICD-10-CM | POA: Diagnosis not present

## 2016-03-14 DIAGNOSIS — Z950 Presence of cardiac pacemaker: Secondary | ICD-10-CM

## 2016-03-14 DIAGNOSIS — I4589 Other specified conduction disorders: Secondary | ICD-10-CM | POA: Diagnosis not present

## 2016-03-14 LAB — CUP PACEART INCLINIC DEVICE CHECK
Battery Voltage: 2.99 V
Brady Statistic AP VP Percent: 0.15 %
Brady Statistic AP VS Percent: 47.69 %
Brady Statistic AS VP Percent: 0.29 %
Brady Statistic RA Percent Paced: 47.85 %
Implantable Lead Implant Date: 20130626
Implantable Lead Model: 5086
Implantable Lead Model: 5086
Lead Channel Impedance Value: 408 Ohm
Lead Channel Pacing Threshold Amplitude: 1 V
Lead Channel Pacing Threshold Amplitude: 1 V
Lead Channel Pacing Threshold Pulse Width: 0.4 ms
Lead Channel Pacing Threshold Pulse Width: 0.4 ms
Lead Channel Setting Pacing Amplitude: 2 V
Lead Channel Setting Sensing Sensitivity: 0.9 mV
MDC IDC LEAD IMPLANT DT: 20130626
MDC IDC LEAD LOCATION: 753859
MDC IDC LEAD LOCATION: 753860
MDC IDC MSMT LEADCHNL RA IMPEDANCE VALUE: 408 Ohm
MDC IDC MSMT LEADCHNL RA SENSING INTR AMPL: 1.188 mV
MDC IDC MSMT LEADCHNL RV SENSING INTR AMPL: 12.835 mV
MDC IDC SESS DTM: 20170731161338
MDC IDC SET LEADCHNL RV PACING AMPLITUDE: 2.5 V
MDC IDC SET LEADCHNL RV PACING PULSEWIDTH: 0.4 ms
MDC IDC STAT BRADY AS VS PERCENT: 51.87 %
MDC IDC STAT BRADY RV PERCENT PACED: 0.44 %

## 2016-03-14 NOTE — Patient Instructions (Signed)
Medication Instructions: - Your physician has recommended you make the following change in your medication:  1) Stop lisinopril  Labwork: - none  Procedures/Testing: - none  Follow-Up: - Remote monitoring is used to monitor your Pacemaker of ICD from home. This monitoring reduces the number of office visits required to check your device to one time per year. It allows [place] to keep an eye on the functioning of your device to ensure it is working properly. You are scheduled for a device check from home on 06/13/16. You may send your transmission at any time that day. If you have a wireless device, the transmission will be sent automatically. After your physician reviews your transmission, you will receive a postcard with your next transmission date.  - Your physician wants you to follow-up in: 1 year with Dr. Caryl Comes. You will receive a reminder letter in the mail two months in advance. If you don't receive a letter, please call our office to schedule the follow-up appointment.  Any Additional Special Instructions Will Be Listed Below (If Applicable). ** Please call [name], Dr. Caryl Comes nurse, to confirm your metoprolol dose    If you need a refill on your cardiac medications before your next appointment, please call your pharmacy.

## 2016-03-14 NOTE — Progress Notes (Signed)
Patient Care Team: Bernerd Limbo, MD as PCP - General (Family Medicine) Bernerd Limbo, MD (Family Medicine)   HPI  Donna Ortega is a 82 y.o. 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  Has history of atrial fibrillation dating back 5 years for which amiod was tried along the way but was not tolerated  She currently is taking apixoban   This patients CHA2DS2-VASc Score and unadjusted Ischemic Stroke Rate (% per year) is equal to 7.2 % stroke rate/year from a score of 5  Above score calculated as 1 point each if present [CHF, HTN, DM, Vascular=MI/PAD/Aortic Plaque, Age if 65-74, or Female] Above score calculated as 2 points each if present [Age > 75, or Stroke/TIA/TE  She is s/p CABG, has normal LV function   Echo 7/15 confirmed  She denies chest pain  She has scant edema. She does complain of being tired. When she goes up and down the stairs frequently she gets short of breath.   She is also having problems with palpitations.    Past Medical History:  Diagnosis Date  . Arthritis    osteo   in back  . Coronary artery disease 2007   3 vessel CABG with LIMA-LAD, SVG to diag, and SVG - Ramus  . Diverticulitis   . GI bleeding 09/09/2013  . Hearing loss of left ear 07/2013  . History of atrial fibrillation; review of all ECG are most consistent with atrial tachycardia    had rash with amiodarone; no anticoagulation due to GI bleed in December 2012  . History of GI bleed Dec 2012  . History of hyperkalemia   . Hyperlipidemia   . Hypertension   . Hyponatremia   . Pacemaker-Medtronic 02/09/2012  . Palpitations     Past Surgical History:  Procedure Laterality Date  . ABDOMINAL HYSTERECTOMY    . COLONOSCOPY N/A 09/12/2013   Procedure: COLONOSCOPY;  Surgeon: Wonda Horner, MD;  Location: [facility] ENDOSCOPY;  Service: Endoscopy;  Laterality: N/A;  . CORONARY ARTERY BYPASS GRAFT  Feb 2007   Dr. Cyndia Bent;   . ESOPHAGOGASTRODUODENOSCOPY   08/04/2011   Procedure: ESOPHAGOGASTRODUODENOSCOPY (EGD);  Surgeon: Cleotis Nipper, MD;  Location: [facility] ENDOSCOPY;  Service: Endoscopy;  Laterality: N/A;  do at bedside  . ESOPHAGOGASTRODUODENOSCOPY N/A 09/11/2013   Procedure: ESOPHAGOGASTRODUODENOSCOPY (EGD);  Surgeon: Jeryl Columbia, MD;  Location: [facility] ENDOSCOPY;  Service: Endoscopy;  Laterality: N/A;  . MAZE    . pace maker    . PARTIAL HYSTERECTOMY    . PERMANENT PACEMAKER INSERTION N/A 02/08/2012   Procedure: PERMANENT PACEMAKER INSERTION;  Surgeon: Deboraha Sprang, MD;  Location: [facility] CATH LAB;  Service: Cardiovascular;  Laterality: N/A;    Current Outpatient Prescriptions  Medication Sig Dispense Refill  . alendronate (FOSAMAX) 70 MG tablet Take 70 mg by mouth once a week. Take on Thursday with a full glass of water on an empty stomach.    Marland Kitchen apixaban (ELIQUIS) 2.5 MG TABS tablet Take 1 tablet (2.5 mg total) by mouth 2 (two) times daily. 60 tablet 11  . Calcium Carbonate-Vitamin D (CALTRATE 600+D PO) Take 1 tablet by mouth daily.     . cephALEXin (KEFLEX) 500 MG capsule Take 1 capsule (500 mg total) by mouth 2 (two) times daily. 28 capsule 0  . diltiazem (CARDIZEM CD) 240 MG 24 hr capsule TAKE 1 CAPSULE (240 MG TOTAL) BY MOUTH DAILY. 30 capsule 11  . diphenhydramine-acetaminophen (TYLENOL PM) 25-500 MG  TABS tablet Take 2 tablets by mouth at bedtime.    Marland Kitchen HYDROcodone-acetaminophen (NORCO/VICODIN) 5-325 MG tablet Take 1 tablet by mouth daily as needed for moderate pain.     Marland Kitchen lisinopril (PRINIVIL,ZESTRIL) 20 MG tablet TAKE 1 TABLET (20 MG TOTAL) BY MOUTH DAILY. 30 tablet 10  . metoprolol (LOPRESSOR) 50 MG tablet Take 25 mg by mouth 2 (two) times daily.    . metoprolol (LOPRESSOR) 50 MG tablet TAKE 1 TABLET (100 MG TOTAL) BY MOUTH TWO   TIMES DAILY. 180 tablet 1  . Multiple Vitamins-Minerals (CENTRUM SILVER ADULT 50+ PO) Take 1 tablet by mouth daily.    . pantoprazole (PROTONIX) 40 MG tablet TAKE 1 TABLET (40 MG TOTAL) BY MOUTH DAILY. 90 tablet 1   . pravastatin (PRAVACHOL) 80 MG tablet TAKE 1 TABLET (80 MG TOTAL) BY MOUTH DAILY. 90 tablet 0  . predniSONE (DELTASONE) 5 MG tablet Take 5 mg by mouth daily.    Marland Kitchen triamterene-hydrochlorothiazide (MAXZIDE-25) 37.5-25 MG per tablet TAKE ONE WHOLE TABLET BY MOUTH EVERY OTHER DAY 15 tablet 2   No current facility-administered medications for this visit.     Allergies  Allergen Reactions  . Amiodarone Hives    ALL OVER BODY HIVES/ ITCH   . Aspirin Other (See Comments)    Gi bleed   . Lipitor [Atorvastatin Calcium] Other (See Comments)    MUSCLE ACHES  . Nsaids Other (See Comments)    GI bleed  . Tolmetin Other (See Comments)    GI bleed  . Naproxen Other (See Comments)  . Niacin Rash  . Niaspan [Niacin Er] Rash    Review of Systems negative except from HPI and PMH  Physical Exam BP (!) 90/48   Pulse 91   Ht 5' 0.5" (1.537 m)   Wt 136 lb (61.7 kg)   SpO2 96%   BMI 26.12 kg/m  Well developed and well nourished in no acute distress HENT normal E scleral and icterus clear Neck Supple JVP flat; carotids brisk and full Clear to ausculation Rapid but regular rate and rhythm, no murmurs gallops or rub Soft with active bowel sounds No clubbing cyanosis tr+ Edema Alert and oriented, grossly normal motor and sensory function Skin Warm and Dry  ECG demonstrates slnus  tachycardia at 91  Intervals 22/08/38  Assessment and  Plan  Sinus node dysfunction/chronotropic incompetence  Atrial tachycardia  Hypertension    Atrial fibrillation     Pacemaker-Medtronic  The patient's device was interrogated.  The information was reviewed. No changes were made in the programming.       Her blood pressure is low. We will discontinue her lisinopril.  We will have her clarify her metoprolol dose. With ongoing problems of atrial tachycardia with rapid rates we will plan to continue metoprolol and Cardizem

## 2016-03-16 ENCOUNTER — Encounter: Payer: Self-pay | Admitting: Internal Medicine

## 2016-03-31 ENCOUNTER — Telehealth: Payer: Self-pay | Admitting: Cardiovascular Disease

## 2016-03-31 NOTE — Telephone Encounter (Signed)
Spoke with patient who wanted to review prescriptions that are covered by Dr. Acie Fredrickson.  I reviewed her medication list with her while she reviewed pill bottles.  I She verbalized understanding of the cardiac medications but has continued to take Lisinopril despite advice from Dr. Caryl Comes on 7/31 to d/c due to hypotension.  She states she thought he told her to take 1 daily.  I advised she stop the medication.  She verbalized understanding and agreement and thanked me for my help.

## 2016-03-31 NOTE — Telephone Encounter (Signed)
New message   Pt calling for rn to call her back and to give her a list of all the medications that she has been on over the years prescribed by Dr.Nasher

## 2016-04-01 ENCOUNTER — Ambulatory Visit: Payer: Medicare HMO | Admitting: Cardiovascular Disease

## 2016-04-06 ENCOUNTER — Telehealth: Payer: Self-pay | Admitting: Internal Medicine

## 2016-04-06 ENCOUNTER — Ambulatory Visit (INDEPENDENT_AMBULATORY_CARE_PROVIDER_SITE_OTHER): Payer: Medicare HMO | Admitting: Pharmacist

## 2016-04-06 DIAGNOSIS — I4891 Unspecified atrial fibrillation: Secondary | ICD-10-CM

## 2016-04-06 LAB — CBC
HEMATOCRIT: 37.6 % (ref 35.0–45.0)
Hemoglobin: 12.6 g/dL (ref 11.7–15.5)
MCH: 31.1 pg (ref 27.0–33.0)
MCHC: 33.5 g/dL (ref 32.0–36.0)
MCV: 92.8 fL (ref 80.0–100.0)
MPV: 9.3 fL (ref 7.5–12.5)
Platelets: 201 10*3/uL (ref 140–400)
RBC: 4.05 MIL/uL (ref 3.80–5.10)
RDW: 12.9 % (ref 11.0–15.0)
WBC: 6 10*3/uL (ref 3.8–10.8)

## 2016-04-06 LAB — BASIC METABOLIC PANEL
BUN: 19 mg/dL (ref 7–25)
CALCIUM: 9.1 mg/dL (ref 8.6–10.4)
CO2: 25 mmol/L (ref 20–31)
CREATININE: 1.1 mg/dL — AB (ref 0.60–0.88)
Chloride: 98 mmol/L (ref 98–110)
Glucose, Bld: 87 mg/dL (ref 65–99)
Potassium: 4.4 mmol/L (ref 3.5–5.3)
Sodium: 134 mmol/L — ABNORMAL LOW (ref 135–146)

## 2016-04-06 NOTE — Progress Notes (Signed)
Pt was started on Eliquis 2.5mg  q 12 hours for PAF on July 18,2016.    Reviewed patients medication list.  Pt is not  currently on any combined P-gp and strong CYP3A4 inhibitors/inducers (ketoconazole, traconazole, ritonavir, carbamazepine, phenytoin, rifampin, St. John's wort).  Reviewed labs: SCr-1.10, Weight 61 kg.  Dose is  appropriate based on age weight and SrCr.   Hgb and HCT 12.6/37.6  A full discussion of the nature of anticoagulants has been carried out.  A benefit/risk analysis has been presented to the patient, so that they understand the justification for choosing anticoagulation with Eliquis at this time.  The need for compliance is stressed.  Pt is aware to take the medication twice daily.  Side effects of potential bleeding are discussed, including unusual colored urine or stools, coughing up blood or coffee ground emesis, nose bleeds or serious fall or head trauma.  Discussed signs and symptoms of stroke. The patient should avoid any OTC items containing aspirin or ibuprofen.  Avoid alcohol consumption.   Call if any signs of abnormal bleeding.  Discussed financial obligations and states is not having  any difficulty in obtaining medication.  Next lab test test in 6 months.  Pt had several questions about her other medications.  She had her pill box with her and we corrected the medications so that our list and her pill box match.  She does not keep her Eliquis with her other medications so unable to confirm compliance today.

## 2016-04-06 NOTE — Telephone Encounter (Signed)
Walk in pt form-patient has questions about meds-[name] back Friday

## 2016-04-13 DIAGNOSIS — I4892 Unspecified atrial flutter: Secondary | ICD-10-CM | POA: Diagnosis not present

## 2016-04-13 DIAGNOSIS — M810 Age-related osteoporosis without current pathological fracture: Secondary | ICD-10-CM | POA: Diagnosis not present

## 2016-04-13 DIAGNOSIS — E7800 Pure hypercholesterolemia, unspecified: Secondary | ICD-10-CM | POA: Diagnosis not present

## 2016-04-13 DIAGNOSIS — K219 Gastro-esophageal reflux disease without esophagitis: Secondary | ICD-10-CM | POA: Diagnosis not present

## 2016-04-13 DIAGNOSIS — Z0000 Encounter for general adult medical examination without abnormal findings: Secondary | ICD-10-CM | POA: Diagnosis not present

## 2016-04-13 DIAGNOSIS — I4891 Unspecified atrial fibrillation: Secondary | ICD-10-CM | POA: Diagnosis not present

## 2016-04-13 DIAGNOSIS — I10 Essential (primary) hypertension: Secondary | ICD-10-CM | POA: Diagnosis not present

## 2016-04-15 DIAGNOSIS — R350 Frequency of micturition: Secondary | ICD-10-CM | POA: Diagnosis not present

## 2016-04-15 DIAGNOSIS — N3281 Overactive bladder: Secondary | ICD-10-CM | POA: Diagnosis not present

## 2016-05-10 DIAGNOSIS — M4850XD Collapsed vertebra, not elsewhere classified, site unspecified, subsequent encounter for fracture with routine healing: Secondary | ICD-10-CM | POA: Diagnosis not present

## 2016-05-10 DIAGNOSIS — K250 Acute gastric ulcer with hemorrhage: Secondary | ICD-10-CM | POA: Diagnosis not present

## 2016-05-10 DIAGNOSIS — Z23 Encounter for immunization: Secondary | ICD-10-CM | POA: Diagnosis not present

## 2016-05-10 DIAGNOSIS — I495 Sick sinus syndrome: Secondary | ICD-10-CM | POA: Diagnosis not present

## 2016-05-10 DIAGNOSIS — I10 Essential (primary) hypertension: Secondary | ICD-10-CM | POA: Diagnosis not present

## 2016-05-10 DIAGNOSIS — I480 Paroxysmal atrial fibrillation: Secondary | ICD-10-CM | POA: Diagnosis not present

## 2016-05-10 DIAGNOSIS — N183 Chronic kidney disease, stage 3 (moderate): Secondary | ICD-10-CM | POA: Diagnosis not present

## 2016-05-20 ENCOUNTER — Ambulatory Visit (INDEPENDENT_AMBULATORY_CARE_PROVIDER_SITE_OTHER): Payer: Medicare HMO | Admitting: Cardiovascular Disease

## 2016-05-20 ENCOUNTER — Encounter: Payer: Self-pay | Admitting: Cardiovascular Disease

## 2016-05-20 VITALS — BP 100/60 | HR 80 | Ht 60.5 in | Wt 136.8 lb

## 2016-05-20 DIAGNOSIS — I482 Chronic atrial fibrillation, unspecified: Secondary | ICD-10-CM

## 2016-05-20 DIAGNOSIS — I2510 Atherosclerotic heart disease of native coronary artery without angina pectoris: Secondary | ICD-10-CM | POA: Diagnosis not present

## 2016-05-20 DIAGNOSIS — I10 Essential (primary) hypertension: Secondary | ICD-10-CM | POA: Diagnosis not present

## 2016-05-20 NOTE — Progress Notes (Signed)
Donna Ortega Date of Birth  [birth year] [place] [address]    [place]   [place], [place]  [address]    [place], [place]  [address] [phone]  Fax  [phone]  [phone]  Fax [phone]  Problems:  1. CAD - s/p CABG 2007, 2. PACs 3. Drug Rash - amiodarone 4. GI Bleed 5. Anemia 6. hyponatremia   History of Present Illness:  Donna Ortega is an 82 year old female with a history of coronary artery disease and atrial fibrillation. She was admitted to the hospital in December with a GI bleed, anemia, and rapid atrial fibrillation. She was discharged with the additional medications of amiodarone and Cardizem CD.  She was seen in mid January and was doing fairly well.  She stopped her Amiodarone due to drug rash.  She still has generalized fatigue and generalized weakness. She wonders if it may be due to the Lasix and potassium.  April 17, 2012 She has been more fatigued recently.  She played golf this past weekend and still is not over the exertion.  Dec. 3, 2013 :  She is doing well.  Dr. Caryl Comes stopped her Pradaxa due to GI bleed.    She is doing very well.   January 15, 2013:  Donna Ortega is doing OK.  She does not have as much energy as she would like.   She has continued to have problems with hyponatremia.  She is on maxzide which is likely exacerbating this.  No CP, no dyspnea.    Dec. 1, 2014:  Donna Ortega is doing well.  No CP or dyspnea.  Rhythm is stable.   Her Hb was a bit low when last checked by her medical doctor.  Has not started on the iron tabs.   No CP, not playing golf any more because of back pain ( s/p kyphoplasty)  .  She is still walking around the block on occasion.   She is now in her own apartment.   Sept. 9, 2015:  Donna Ortega is doing well.  She is not having any problems   March 02, 2015: No CP , no dyspnea.   Feb. 13, 2017:  Fell 3 weeks ago .  Now is using a cane for stability  She has occasional episodes  of atrial fib - last 5 minutes or so.   Is on Eliquis 2.5 BID  Has had some bladder issues   Oct. 6, 2017:  Donna Ortega is seen back for her CAD and PAF  Has some leg swelling .  Was on Maxzide - this is off her list - perhaps because of hypotension . Lisinopril was stopped at that time     Current Outpatient Prescriptions on File Prior to Visit  Medication Sig Dispense Refill  . alendronate (FOSAMAX) 70 MG tablet Take 70 mg by mouth once a week. Take on Thursday with a full glass of water on an empty stomach.    . Calcium Carbonate-Vitamin D (CALTRATE 600+D PO) Take 1 tablet by mouth daily.     Marland Kitchen diltiazem (CARDIZEM CD) 240 MG 24 hr capsule TAKE 1 CAPSULE (240 MG TOTAL) BY MOUTH DAILY. 30 capsule 11  . diphenhydramine-acetaminophen (TYLENOL PM) 25-500 MG TABS tablet Take 2 tablets by mouth at bedtime.    Marland Kitchen ELIQUIS 2.5 MG TABS tablet TAKE 1 TABLET BY MOUTH TWO   TIMES DAILY. 180 tablet 1  . metoprolol (LOPRESSOR) 50 MG tablet Take 50  mg by mouth 2 (two) times daily.    . Multiple Vitamins-Minerals (CENTRUM SILVER ADULT 50+ PO) Take 1 tablet by mouth daily.    . pantoprazole (PROTONIX) 40 MG tablet TAKE 1 TABLET (40 MG TOTAL) BY MOUTH DAILY. 90 tablet 1  . pravastatin (PRAVACHOL) 80 MG tablet TAKE 1 TABLET (80 MG TOTAL) BY MOUTH DAILY. 90 tablet 0  . triamterene-hydrochlorothiazide (MAXZIDE-25) 37.5-25 MG per tablet TAKE ONE WHOLE TABLET BY MOUTH EVERY OTHER DAY (Patient not taking: Reported on 05/20/2016) 15 tablet 2   No current facility-administered medications on file prior to visit.     Allergies  Allergen Reactions  . Amiodarone Hives    ALL OVER BODY HIVES/ ITCH   . Aspirin Other (See Comments)    Gi bleed   . Lipitor [Atorvastatin Calcium] Other (See Comments)    MUSCLE ACHES  . Nsaids Other (See Comments)    GI bleed  . Tolmetin Other (See Comments)    GI bleed  . Naproxen Other (See Comments)  . Niacin Rash  . Niaspan [Niacin Er] Rash    Past Medical History:  Diagnosis  Date  . Arthritis    osteo   in back  . Coronary artery disease 2007   3 vessel CABG with LIMA-LAD, SVG to diag, and SVG - Ramus  . Diverticulitis   . GI bleeding 09/09/2013  . Hearing loss of left ear 07/2013  . History of atrial fibrillation; review of all ECG are most consistent with atrial tachycardia    had rash with amiodarone; no anticoagulation due to GI bleed in December 2012  . History of GI bleed Dec 2012  . History of hyperkalemia   . Hyperlipidemia   . Hypertension   . Hyponatremia   . Pacemaker-Medtronic 02/09/2012  . Palpitations     Past Surgical History:  Procedure Laterality Date  . ABDOMINAL HYSTERECTOMY    . COLONOSCOPY N/A 09/12/2013   Procedure: COLONOSCOPY;  Surgeon: Wonda Horner, MD;  Location: [facility] ENDOSCOPY;  Service: Endoscopy;  Laterality: N/A;  . CORONARY ARTERY BYPASS GRAFT  Feb 2007   Dr. Cyndia Bent;   . ESOPHAGOGASTRODUODENOSCOPY  08/04/2011   Procedure: ESOPHAGOGASTRODUODENOSCOPY (EGD);  Surgeon: Cleotis Nipper, MD;  Location: [facility] ENDOSCOPY;  Service: Endoscopy;  Laterality: N/A;  do at bedside  . ESOPHAGOGASTRODUODENOSCOPY N/A 09/11/2013   Procedure: ESOPHAGOGASTRODUODENOSCOPY (EGD);  Surgeon: Jeryl Columbia, MD;  Location: [facility] ENDOSCOPY;  Service: Endoscopy;  Laterality: N/A;  . MAZE    . pace maker    . PARTIAL HYSTERECTOMY    . PERMANENT PACEMAKER INSERTION N/A 02/08/2012   Procedure: PERMANENT PACEMAKER INSERTION;  Surgeon: Deboraha Sprang, MD;  Location[facility] CATH LAB;  Service: Cardiovascular;  Laterality: N/A;    History  Smoking Status  . Never Smoker  Smokeless Tobacco  . Never Used    History  Alcohol Use No    Family History  Problem Relation Age of Onset  . Heart failure Father   . Kidney failure Father   . Bone cancer Brother   . Kidney failure Sister   . Colon cancer Sister   . Cancer Sister     Reviw of Systems:  Reviewed in the HPI.  All other systems are negative.  Physical Exam: Blood pressure 100/60, pulse 80, height  5' 0.5" (1.537 m), weight 136 lb 12.8 oz (62.1 kg). General: Well developed, well nourished, in no acute distress. Head: Normocephalic, atraumatic, sclera non-icteric, mucus membranes are moist,  Neck: Supple. Negative for  carotid bruits. JVD not elevated. Lungs: Clear bilaterally to auscultation without wheezes, rales, or rhonchi. Breathing is unlabored. Heart:  Irreg. Irreg.    normal S1 S2.   Abdomen: Soft, non-tender, non-distended with normoactive bowel sounds. No hepatomegaly. No rebound/guarding. No obvious abdominal masses. Msk:  Strength and tone appear normal for age. Extremities: No clubbing or cyanosis.  Trace bilateral leg edema.  Distal pedal pulses are 2+ and equal bilaterally. Neuro: Alert and oriented X 3. Moves all extremities spontaneously. Psych:  Responds to questions appropriately with a normal affect.   ECG:   Assessment / Plan:   1. CAD - s/p CABG 2007,   - no angina ,  Doing well , able to do all of her usual activities without issues    2. Atrial fib:  Has been on Eliquis. Has occasional episodes of PAF .     3. Drug Rash - amiodarone  4. GI Bleed  - will keep a close eye on her Hb since we are starting the Eliquis .   5. Anemia - will check Hb today .  6. Hyponatremia- followed by primary   7. Hyperlipidemia - will get fasting lipids today .  She had BMP and liver enz.  Recently   8. HTN:  BP is actually low now. Lisiniopril has been stopped. She is also off the maxzide - will DC it since her BP is too low.   Have recommneded compression hose and leg elevation for her mild leg edema .   Will see her in 6 months   Mertie Moores, MD  05/20/2016 10:36 AM    [address],  [place], [place]  [address] Pager [phone] Phone: [phone]; Fax: [phone]

## 2016-05-20 NOTE — Patient Instructions (Signed)
Medication Instructions:  STOP Maxzide   Labwork: None Ordered   Testing/Procedures: None Ordered   Follow-Up: Your physician wants you to follow-up in: 6 months with Dr. Acie Fredrickson.  You will receive a reminder letter in the mail two months in advance. If you don't receive a letter, please call our office to schedule the follow-up appointment.   If you need a refill on your cardiac medications before your next appointment, please call your pharmacy.   Thank you for choosing [facility]! Christen Bame, RN [phone]

## 2016-06-13 ENCOUNTER — Telehealth: Payer: Self-pay | Admitting: Cardiovascular Disease

## 2016-06-13 ENCOUNTER — Ambulatory Visit (INDEPENDENT_AMBULATORY_CARE_PROVIDER_SITE_OTHER): Payer: Medicare HMO | Admitting: *Deleted

## 2016-06-13 DIAGNOSIS — I495 Sick sinus syndrome: Secondary | ICD-10-CM | POA: Diagnosis not present

## 2016-06-13 NOTE — Telephone Encounter (Signed)
error 

## 2016-06-13 NOTE — Progress Notes (Signed)
Remote pacemaker transmission.   

## 2016-06-14 DIAGNOSIS — Z0000 Encounter for general adult medical examination without abnormal findings: Secondary | ICD-10-CM | POA: Diagnosis not present

## 2016-06-14 DIAGNOSIS — R8299 Other abnormal findings in urine: Secondary | ICD-10-CM | POA: Diagnosis not present

## 2016-06-15 DIAGNOSIS — R69 Illness, unspecified: Secondary | ICD-10-CM | POA: Diagnosis not present

## 2016-06-17 ENCOUNTER — Encounter: Payer: Self-pay | Admitting: Cardiology

## 2016-06-28 LAB — CUP PACEART REMOTE DEVICE CHECK
Battery Voltage: 2.99 V
Brady Statistic AS VP Percent: 0.03 %
Date Time Interrogation Session: 20171030131000
Implantable Lead Implant Date: 20130626
Implantable Lead Location: 753859
Implantable Pulse Generator Implant Date: 20130626
Lead Channel Impedance Value: 408 Ohm
Lead Channel Sensing Intrinsic Amplitude: 9.366 mV
Lead Channel Setting Pacing Amplitude: 2.5 V
MDC IDC LEAD IMPLANT DT: 20130626
MDC IDC LEAD LOCATION: 753860
MDC IDC LEAD MODEL: 5086
MDC IDC LEAD MODEL: 5086
MDC IDC MSMT LEADCHNL RA IMPEDANCE VALUE: 416 Ohm
MDC IDC MSMT LEADCHNL RA SENSING INTR AMPL: 0.976 mV
MDC IDC SET LEADCHNL RA PACING AMPLITUDE: 2 V
MDC IDC SET LEADCHNL RV PACING PULSEWIDTH: 0.4 ms
MDC IDC SET LEADCHNL RV SENSING SENSITIVITY: 0.9 mV
MDC IDC STAT BRADY AP VP PERCENT: 0.05 %
MDC IDC STAT BRADY AP VS PERCENT: 23.53 %
MDC IDC STAT BRADY AS VS PERCENT: 76.39 %
MDC IDC STAT BRADY RA PERCENT PACED: 23.58 %
MDC IDC STAT BRADY RV PERCENT PACED: 0.08 %

## 2016-07-25 ENCOUNTER — Other Ambulatory Visit: Payer: Self-pay | Admitting: Cardiovascular Disease

## 2016-07-25 DIAGNOSIS — I480 Paroxysmal atrial fibrillation: Secondary | ICD-10-CM

## 2016-08-22 ENCOUNTER — Other Ambulatory Visit: Payer: Self-pay | Admitting: Cardiovascular Disease

## 2016-08-24 NOTE — Telephone Encounter (Signed)
°*  STAT* If patient is at the pharmacy, call can be transferred to refill team.   1. Which medications need to be refilled? (please list name of each medication and dose if known) metoprolol 50mg  2. Which pharmacy/location (including street and city if local pharmacy) is medication to be sent to? [facility] [place] [facility] 3. Do they need a 30 day or 90 day supply? [place]

## 2016-08-24 NOTE — Telephone Encounter (Signed)
Rx refill sent to pharmacy. 

## 2016-09-12 ENCOUNTER — Telehealth: Payer: Self-pay | Admitting: Cardiology

## 2016-09-12 ENCOUNTER — Ambulatory Visit (INDEPENDENT_AMBULATORY_CARE_PROVIDER_SITE_OTHER): Payer: Medicare HMO | Admitting: *Deleted

## 2016-09-12 DIAGNOSIS — I495 Sick sinus syndrome: Secondary | ICD-10-CM | POA: Diagnosis not present

## 2016-09-12 NOTE — Telephone Encounter (Signed)
Spoke with pt and reminded pt of remote transmission that is due today. Pt verbalized understanding.   

## 2016-09-13 ENCOUNTER — Encounter: Payer: Self-pay | Admitting: Cardiology

## 2016-09-13 LAB — CUP PACEART REMOTE DEVICE CHECK
Brady Statistic AP VP Percent: 0.1 % — CL
Brady Statistic AP VS Percent: 32.4 %
Brady Statistic AS VS Percent: 67.5 %
Date Time Interrogation Session: 20180130120806
Implantable Lead Implant Date: 20130626
Implantable Lead Location: 753859
Lead Channel Impedance Value: 416 Ohm
Lead Channel Sensing Intrinsic Amplitude: 1.1 mV
MDC IDC LEAD IMPLANT DT: 20130626
MDC IDC LEAD LOCATION: 753860
MDC IDC MSMT BATTERY VOLTAGE: 2.99 V
MDC IDC MSMT LEADCHNL RV IMPEDANCE VALUE: 400 Ohm
MDC IDC MSMT LEADCHNL RV SENSING INTR AMPL: 6.9 mV
MDC IDC PG IMPLANT DT: 20130626
MDC IDC STAT BRADY AS VP PERCENT: 0.1 % — AB

## 2016-09-13 NOTE — Progress Notes (Signed)
Remote pacemaker transmission.   

## 2016-09-15 LAB — CUP PACEART REMOTE DEVICE CHECK
Brady Statistic AP VP Percent: 0.07 %
Brady Statistic AS VP Percent: 0.02 %
Brady Statistic AS VS Percent: 67.48 %
Implantable Lead Implant Date: 20130626
Implantable Lead Location: 753859
Implantable Lead Location: 753860
Lead Channel Impedance Value: 400 Ohm
Lead Channel Sensing Intrinsic Amplitude: 1.06 mV
Lead Channel Sensing Intrinsic Amplitude: 6.938 mV
Lead Channel Setting Pacing Amplitude: 2.5 V
Lead Channel Setting Pacing Pulse Width: 0.4 ms
MDC IDC LEAD IMPLANT DT: 20130626
MDC IDC MSMT BATTERY VOLTAGE: 2.99 V
MDC IDC MSMT LEADCHNL RA IMPEDANCE VALUE: 416 Ohm
MDC IDC PG IMPLANT DT: 20130626
MDC IDC SESS DTM: 20180130164726
MDC IDC SET LEADCHNL RA PACING AMPLITUDE: 2 V
MDC IDC SET LEADCHNL RV SENSING SENSITIVITY: 0.9 mV
MDC IDC STAT BRADY AP VS PERCENT: 32.43 %
MDC IDC STAT BRADY RA PERCENT PACED: 32.22 %
MDC IDC STAT BRADY RV PERCENT PACED: 0.11 %

## 2016-10-10 DIAGNOSIS — I10 Essential (primary) hypertension: Secondary | ICD-10-CM | POA: Diagnosis not present

## 2016-10-10 DIAGNOSIS — Z1231 Encounter for screening mammogram for malignant neoplasm of breast: Secondary | ICD-10-CM | POA: Diagnosis not present

## 2016-10-10 DIAGNOSIS — M810 Age-related osteoporosis without current pathological fracture: Secondary | ICD-10-CM | POA: Diagnosis not present

## 2016-10-10 DIAGNOSIS — E871 Hypo-osmolality and hyponatremia: Secondary | ICD-10-CM | POA: Diagnosis not present

## 2016-11-25 ENCOUNTER — Other Ambulatory Visit: Payer: Self-pay | Admitting: Cardiovascular Disease

## 2016-11-29 ENCOUNTER — Ambulatory Visit (INDEPENDENT_AMBULATORY_CARE_PROVIDER_SITE_OTHER): Payer: Medicare HMO | Admitting: Cardiovascular Disease

## 2016-11-29 ENCOUNTER — Encounter (INDEPENDENT_AMBULATORY_CARE_PROVIDER_SITE_OTHER): Payer: Self-pay

## 2016-11-29 ENCOUNTER — Encounter: Payer: Self-pay | Admitting: Cardiovascular Disease

## 2016-11-29 VITALS — BP 158/84 | HR 65 | Ht 60.5 in | Wt 137.0 lb

## 2016-11-29 DIAGNOSIS — I480 Paroxysmal atrial fibrillation: Secondary | ICD-10-CM | POA: Diagnosis not present

## 2016-11-29 DIAGNOSIS — I2510 Atherosclerotic heart disease of native coronary artery without angina pectoris: Secondary | ICD-10-CM

## 2016-11-29 MED ORDER — TRIAMTERENE-HCTZ 37.5-25 MG PO TABS: 1.0000 | ORAL_TABLET | Freq: Every day | ORAL | 3 refills | Status: DC

## 2016-11-29 NOTE — Patient Instructions (Signed)
Medication Instructions:   START MAXZIDE 37.5-25 MG ONCE DAILY  Labwork:  Your physician recommends that you return for lab work in: [place]:  Your physician wants you to follow-up in: [place] will receive a reminder letter in the mail two months in advance. If you don't receive a letter, please call our office to schedule the follow-up appointment.   If you need a refill on your cardiac medications before your next appointment, please call your pharmacy.

## 2016-11-29 NOTE — Progress Notes (Signed)
Donna Ortega Date of Birth  [birth year] [facility]      [address]    [address]    [place], [place]  [address]      Problems:  1. CAD - s/p CABG 2007, 2. PACs 3. Drug Rash - amiodarone 4. GI Bleed 5. Anemia 6. hyponatremia  History of Present Illness:  Donna Ortega is an 83 year old female with a history of coronary artery disease and atrial fibrillation. She was admitted to the hospital in December with a GI bleed, anemia, and rapid atrial fibrillation. She was discharged with the additional medications of amiodarone and Cardizem CD.  She was seen in mid January and was doing fairly well.  She stopped her Amiodarone due to drug rash.  She still has generalized fatigue and generalized weakness. She wonders if it may be due to the Lasix and potassium.  April 17, 2012 She has been more fatigued recently.  She played golf this past weekend and still is not over the exertion.  Dec. 3, 2013 :  She is doing well.  Dr. Caryl Comes stopped her Pradaxa due to GI bleed.    She is doing very well.   January 15, 2013:  Donna Ortega is doing OK.  She does not have as much energy as she would like.   She has continued to have problems with hyponatremia.  She is on maxzide which is likely exacerbating this.  No CP, no dyspnea.    Dec. 1, 2014:  Donna Ortega is doing well.  No CP or dyspnea.  Rhythm is stable.   Her Hb was a bit low when last checked by her medical doctor.  Has not started on the iron tabs.   No CP, not playing golf any more because of back pain ( s/p kyphoplasty)  .  She is still walking around the block on occasion.   She is now in her own apartment.   Sept. 9, 2015:  Donna Ortega is doing well.  She is not having any problems   March 02, 2015: No CP , no dyspnea.   Feb. 13, 2017:  Fell 3 weeks ago .  Now is using a cane for stability  She has occasional episodes of atrial fib - last 5 minutes or so.   Is on Eliquis 2.5 BID  Has had some bladder issues   Oct. 6, 2017:  Donna Ortega is  seen back for her CAD and PAF  Has some leg swelling .  Was on Maxzide - this is off her list - perhaps because of hypotension . Lisinopril was stopped at that time    November 29, 2016:  Doing well, no angina  Knows that she is starting to forget things on occasion Has been having some swelling in her feet and ankles.  BP has been a bit higher She avoids salty foods.    Current Outpatient Prescriptions on File Prior to Visit  Medication Sig Dispense Refill  . alendronate (FOSAMAX) 70 MG tablet Take 70 mg by mouth once a week. Take on Thursday with a full glass of water on an empty stomach.    . Calcium Carbonate-Vitamin D (CALTRATE 600+D PO) Take 1 tablet by mouth daily.     Marland Kitchen diltiazem (CARDIZEM CD) 240 MG 24 hr capsule TAKE 1 CAPSULE (240 MG TOTAL) BY MOUTH DAILY. 30 capsule 5  . diphenhydramine-acetaminophen (TYLENOL PM) 25-500 MG TABS tablet Take 2 tablets by mouth at bedtime.    Marland Kitchen ELIQUIS  2.5 MG TABS tablet TAKE 1 TABLET BY MOUTH TWICE A DAY 180 tablet 2  . metoprolol (LOPRESSOR) 50 MG tablet TAKE 1 TABLET (100 MG TOTAL) BY MOUTH TWO   TIMES DAILY. 180 tablet 1  . Multiple Vitamins-Minerals (CENTRUM SILVER ADULT 50+ PO) Take 1 tablet by mouth daily.    . pantoprazole (PROTONIX) 40 MG tablet TAKE 1 TABLET (40 MG TOTAL) BY MOUTH DAILY. 90 tablet 1  . pravastatin (PRAVACHOL) 80 MG tablet TAKE 1 TABLET (80 MG TOTAL) BY MOUTH DAILY. 90 tablet 0   No current facility-administered medications on file prior to visit.     Allergies  Allergen Reactions  . Amiodarone Hives    ALL OVER BODY HIVES/ ITCH   . Aspirin Other (See Comments)    Gi bleed   . Lipitor [Atorvastatin Calcium] Other (See Comments)    MUSCLE ACHES  . Nsaids Other (See Comments)    GI bleed  . Tolmetin Other (See Comments)    GI bleed  . Naproxen Other (See Comments)  . Niacin Rash  . Niaspan [Niacin Er] Rash    Past Medical History:  Diagnosis Date  . Arthritis    osteo   in back  . Coronary artery  disease 2007   3 vessel CABG with LIMA-LAD, SVG to diag, and SVG - Ramus  . Diverticulitis   . GI bleeding 09/09/2013  . Hearing loss of left ear 07/2013  . History of atrial fibrillation; review of all ECG are most consistent with atrial tachycardia    had rash with amiodarone; no anticoagulation due to GI bleed in December 2012  . History of GI bleed Dec 2012  . History of hyperkalemia   . Hyperlipidemia   . Hypertension   . Hyponatremia   . Pacemaker-Medtronic 02/09/2012  . Palpitations     Past Surgical History:  Procedure Laterality Date  . ABDOMINAL HYSTERECTOMY    . COLONOSCOPY N/A 09/12/2013   Procedure: COLONOSCOPY;  Surgeon: Wonda Horner, MD;  Location: [facility] ENDOSCOPY;  Service: Endoscopy;  Laterality: N/A;  . CORONARY ARTERY BYPASS GRAFT  Feb 2007   Dr. Cyndia Bent;   . ESOPHAGOGASTRODUODENOSCOPY  08/04/2011   Procedure: ESOPHAGOGASTRODUODENOSCOPY (EGD);  Surgeon: Cleotis Nipper, MD;  Location: [facility] ENDOSCOPY;  Service: Endoscopy;  Laterality: N/A;  do at bedside  . ESOPHAGOGASTRODUODENOSCOPY N/A 09/11/2013   Procedure: ESOPHAGOGASTRODUODENOSCOPY (EGD);  Surgeon: Jeryl Columbia, MD;  Location: [facility] ENDOSCOPY;  Service: Endoscopy;  Laterality: N/A;  . MAZE    . pace maker    . PARTIAL HYSTERECTOMY    . PERMANENT PACEMAKER INSERTION N/A 02/08/2012   Procedure: PERMANENT PACEMAKER INSERTION;  Surgeon: Deboraha Sprang, MD;  Location: [facility] CATH LAB;  Service: Cardiovascular;  Laterality: N/A;    History  Smoking Status  . Never Smoker  Smokeless Tobacco  . Never Used    History  Alcohol Use No    Family History  Problem Relation Age of Onset  . Heart failure Father   . Kidney failure Father   . Bone cancer Brother   . Kidney failure Sister   . Colon cancer Sister   . Cancer Sister     Reviw of Systems:  Reviewed in the HPI.  All other systems are negative.  Physical Exam: Blood pressure (!) 158/84, pulse 65, height 5' 0.5" (1.537 m), weight 137 lb (62.1 kg), SpO2 98  %. General: Well developed, well nourished, in no acute distress. Head: Normocephalic, atraumatic, sclera non-icteric, mucus membranes are moist,  Neck: Supple. Negative for carotid bruits. JVD not elevated. Lungs: Clear bilaterally to auscultation without wheezes, rales, or rhonchi. Breathing is unlabored. Heart:  Irreg. Irreg.    normal S1 S2.   Abdomen: Soft, non-tender, non-distended with normoactive bowel sounds. No hepatomegaly. No rebound/guarding. No obvious abdominal masses. Msk:  Strength and tone appear normal for age. Extremities: No clubbing or cyanosis.  Trace bilateral leg edema.  Distal pedal pulses are 2+ and equal bilaterally. Neuro: Alert and oriented X 3. Moves all extremities spontaneously. Psych:  Responds to questions appropriately with a normal affect.   ECG: Atrial fib with HR of 65.  NS ST abn.   Assessment / Plan:   1. CAD - s/p CABG 2007,   - no angina ,  Doing well , able to do all of her usual activities without issues   2. Atrial fib:  Has been on Eliquis. Has occasional episodes of PAF .     3. Drug Rash - amiodarone  4. GI Bleed  - will keep a close eye on her Hb since we are starting the Eliquis .   5. Anemia - will check Hb today .  6. Hyponatremia- followed by primary   7. Hyperlipidemia - will get fasting lipids today .  She had BMP and liver enz.  Recently   8. HTN:  BP is  elevated now. Has trace leg edema . Will restart Maxzide 37.5/25 mg a day  Check BMP in 3 weeks .    Will see her in 6 months   Mertie Moores, MD  11/29/2016 9:54 AM    [address],  [place], [place]  [address] Pager [phone] Phone: [phone]; Fax: [phone]

## 2016-12-12 ENCOUNTER — Ambulatory Visit (INDEPENDENT_AMBULATORY_CARE_PROVIDER_SITE_OTHER): Payer: Medicare HMO | Admitting: *Deleted

## 2016-12-12 ENCOUNTER — Telehealth: Payer: Self-pay | Admitting: Cardiology

## 2016-12-12 DIAGNOSIS — I495 Sick sinus syndrome: Secondary | ICD-10-CM

## 2016-12-12 NOTE — Telephone Encounter (Signed)
Spoke with pt and reminded pt of remote transmission that is due today. Pt verbalized understanding.   

## 2016-12-12 NOTE — Progress Notes (Signed)
Remote pacemaker transmission.   

## 2016-12-13 ENCOUNTER — Encounter: Payer: Self-pay | Admitting: Cardiology

## 2016-12-13 LAB — CUP PACEART REMOTE DEVICE CHECK
Brady Statistic AP VP Percent: 0.39 %
Brady Statistic AS VP Percent: 0.05 %
Brady Statistic RA Percent Paced: 36.54 %
Brady Statistic RV Percent Paced: 0.46 %
Date Time Interrogation Session: 20180430192641
Implantable Lead Implant Date: 20130626
Implantable Pulse Generator Implant Date: 20130626
Lead Channel Setting Pacing Amplitude: 2.5 V
Lead Channel Setting Pacing Pulse Width: 0.4 ms
Lead Channel Setting Sensing Sensitivity: 0.9 mV
MDC IDC LEAD IMPLANT DT: 20130626
MDC IDC LEAD LOCATION: 753859
MDC IDC LEAD LOCATION: 753860
MDC IDC MSMT BATTERY VOLTAGE: 2.99 V
MDC IDC MSMT LEADCHNL RA IMPEDANCE VALUE: 408 Ohm
MDC IDC MSMT LEADCHNL RA SENSING INTR AMPL: 0.848 mV
MDC IDC MSMT LEADCHNL RV IMPEDANCE VALUE: 408 Ohm
MDC IDC MSMT LEADCHNL RV SENSING INTR AMPL: 8.325 mV
MDC IDC SET LEADCHNL RA PACING AMPLITUDE: 2 V
MDC IDC STAT BRADY AP VS PERCENT: 36.57 %
MDC IDC STAT BRADY AS VS PERCENT: 62.99 %

## 2016-12-20 ENCOUNTER — Other Ambulatory Visit: Payer: Medicare HMO | Admitting: *Deleted

## 2016-12-20 DIAGNOSIS — I2510 Atherosclerotic heart disease of native coronary artery without angina pectoris: Secondary | ICD-10-CM

## 2016-12-20 LAB — BASIC METABOLIC PANEL
BUN / CREAT RATIO: 21 (ref 12–28)
BUN: 22 mg/dL (ref 8–27)
CALCIUM: 9.3 mg/dL (ref 8.7–10.3)
CHLORIDE: 94 mmol/L — AB (ref 96–106)
CO2: 25 mmol/L (ref 18–29)
Creatinine, Ser: 1.03 mg/dL — ABNORMAL HIGH (ref 0.57–1.00)
GFR, EST AFRICAN AMERICAN: 56 mL/min/{1.73_m2} — AB (ref >59)
GFR, EST NON AFRICAN AMERICAN: 49 mL/min/{1.73_m2} — AB (ref >59)
Glucose: 85 mg/dL (ref 65–99)
POTASSIUM: 4.6 mmol/L (ref 3.5–5.2)
Sodium: 132 mmol/L — ABNORMAL LOW (ref 134–144)

## 2017-03-07 ENCOUNTER — Encounter: Payer: Medicare HMO | Admitting: Internal Medicine

## 2017-03-08 ENCOUNTER — Ambulatory Visit (INDEPENDENT_AMBULATORY_CARE_PROVIDER_SITE_OTHER): Payer: Medicare HMO | Admitting: Internal Medicine

## 2017-03-08 ENCOUNTER — Encounter: Payer: Self-pay | Admitting: Internal Medicine

## 2017-03-08 ENCOUNTER — Encounter (INDEPENDENT_AMBULATORY_CARE_PROVIDER_SITE_OTHER): Payer: Self-pay

## 2017-03-08 VITALS — BP 122/70 | HR 75 | Ht 64.0 in | Wt 135.0 lb

## 2017-03-08 DIAGNOSIS — I480 Paroxysmal atrial fibrillation: Secondary | ICD-10-CM | POA: Diagnosis not present

## 2017-03-08 DIAGNOSIS — I471 Supraventricular tachycardia: Secondary | ICD-10-CM | POA: Diagnosis not present

## 2017-03-08 DIAGNOSIS — I495 Sick sinus syndrome: Secondary | ICD-10-CM | POA: Diagnosis not present

## 2017-03-08 DIAGNOSIS — I4589 Other specified conduction disorders: Secondary | ICD-10-CM | POA: Diagnosis not present

## 2017-03-08 DIAGNOSIS — Z950 Presence of cardiac pacemaker: Secondary | ICD-10-CM

## 2017-03-08 LAB — CBC WITH DIFFERENTIAL/PLATELET
BASOS ABS: 0 10*3/uL (ref 0.0–0.2)
BASOS: 0 %
EOS (ABSOLUTE): 0 10*3/uL (ref 0.0–0.4)
Eos: 1 %
Hematocrit: 35.8 % (ref 34.0–46.6)
Hemoglobin: 12.5 g/dL (ref 11.1–15.9)
IMMATURE GRANULOCYTES: 0 %
Immature Grans (Abs): 0 10*3/uL (ref 0.0–0.1)
LYMPHS: 15 %
Lymphocytes Absolute: 1.2 10*3/uL (ref 0.7–3.1)
MCH: 31.4 pg (ref 26.6–33.0)
MCHC: 34.9 g/dL (ref 31.5–35.7)
MCV: 90 fL (ref 79–97)
MONOS ABS: 0.8 10*3/uL (ref 0.1–0.9)
Monocytes: 10 %
Neutrophils Absolute: 6 10*3/uL (ref 1.4–7.0)
Neutrophils: 74 %
PLATELETS: 221 10*3/uL (ref 150–379)
RBC: 3.98 x10E6/uL (ref 3.77–5.28)
RDW: 12.8 % (ref 12.3–15.4)
WBC: 8.1 10*3/uL (ref 3.4–10.8)

## 2017-03-08 MED ORDER — METOPROLOL TARTRATE 25 MG PO TABS: 25.0000 mg | ORAL_TABLET | Freq: Two times a day (BID) | ORAL | 3 refills | Status: DC

## 2017-03-08 NOTE — Progress Notes (Signed)
Patient Care Team: Bernerd Limbo, MD as PCP - General (Family Medicine) Bernerd Limbo, MD (Family Medicine)   HPI  Donna Ortega is a 83 y.o. 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  Has history of atrial fibrillation dating back 5 years for which amiod was tried along the way but was not tolerated  She currently is taking apixoban. She has ischemic heart disease is s/p CABG 2007, has normal LV function   Echo 7/15 confirmed    Date Cr Hgb  8/17 1.10 12.6  5/18 1.03      The patient denies chest pain, nocturnal dyspnea, orthopnea or peripheral edema.  There have been no palpitations, lightheadedness or syncope.   She has shortness of breath when she climbs up to the first floor. She lives in a basement apartment with her son. This is unassociated with chest pain. She's had no peripheral edema. There is no associated lightheadedness. She has more recently struggled with issues of balance.  No bleeding issues    This patients CHA2DS2-VASc Score and unadjusted Ischemic Stroke Rate (% per year) is equal to 7.2 % stroke rate/year from a score of 5  Above score calculated as 1 point each if present [CHF, HTN, DM, Vascular=MI/PAD/Aortic Plaque, Age if 65-74, or Female] Above score calculated as 2 points each if present [Age > 75, or Stroke/TIA/TE     Past Medical History:  Diagnosis Date  . Arthritis    osteo   in back  . Coronary artery disease 2007   3 vessel CABG with LIMA-LAD, SVG to diag, and SVG - Ramus  . Diverticulitis   . GI bleeding 09/09/2013  . Hearing loss of left ear 07/2013  . History of atrial fibrillation; review of all ECG are most consistent with atrial tachycardia    had rash with amiodarone; no anticoagulation due to GI bleed in December 2012  . History of GI bleed Dec 2012  . History of hyperkalemia   . Hyperlipidemia   . Hypertension   . Hyponatremia   . Pacemaker-Medtronic 02/09/2012  .  Palpitations     Past Surgical History:  Procedure Laterality Date  . ABDOMINAL HYSTERECTOMY    . COLONOSCOPY N/A 09/12/2013   Procedure: COLONOSCOPY;  Surgeon: Wonda Horner, MD;  Location: [facility] ENDOSCOPY;  Service: Endoscopy;  Laterality: N/A;  . CORONARY ARTERY BYPASS GRAFT  Feb 2007   Dr. Cyndia Bent;   . ESOPHAGOGASTRODUODENOSCOPY  08/04/2011   Procedure: ESOPHAGOGASTRODUODENOSCOPY (EGD);  Surgeon: Cleotis Nipper, MD;  Location: [facility] ENDOSCOPY;  Service: Endoscopy;  Laterality: N/A;  do at bedside  . ESOPHAGOGASTRODUODENOSCOPY N/A 09/11/2013   Procedure: ESOPHAGOGASTRODUODENOSCOPY (EGD);  Surgeon: Jeryl Columbia, MD;  Location[facility] ENDOSCOPY;  Service: Endoscopy;  Laterality: N/A;  . MAZE    . pace maker    . PARTIAL HYSTERECTOMY    . PERMANENT PACEMAKER INSERTION N/A 02/08/2012   Procedure: PERMANENT PACEMAKER INSERTION;  Surgeon: Deboraha Sprang, MD;  Location: [facility] CATH LAB;  Service: Cardiovascular;  Laterality: N/A;    Current Outpatient Prescriptions  Medication Sig Dispense Refill  . alendronate (FOSAMAX) 70 MG tablet Take 70 mg by mouth once a week. Take on Thursday with a full glass of water on an empty stomach.    . Calcium Carbonate-Vitamin D (CALTRATE 600+D PO) Take 1 tablet by mouth daily.     Marland Kitchen diltiazem (CARDIZEM CD) 240 MG 24 hr capsule TAKE 1 CAPSULE (240 MG TOTAL)  BY MOUTH DAILY. 30 capsule 5  . diphenhydramine-acetaminophen (TYLENOL PM) 25-500 MG TABS tablet Take 2 tablets by mouth at bedtime.    Marland Kitchen ELIQUIS 2.5 MG TABS tablet TAKE 1 TABLET BY MOUTH TWICE A DAY 180 tablet 2  . metoprolol (LOPRESSOR) 50 MG tablet TAKE 1 TABLET (100 MG TOTAL) BY MOUTH TWO   TIMES DAILY. 180 tablet 1  . Multiple Vitamins-Minerals (CENTRUM SILVER ADULT 50+ PO) Take 1 tablet by mouth daily.    . pantoprazole (PROTONIX) 40 MG tablet TAKE 1 TABLET (40 MG TOTAL) BY MOUTH DAILY. 90 tablet 1  . pravastatin (PRAVACHOL) 80 MG tablet TAKE 1 TABLET (80 MG TOTAL) BY MOUTH DAILY. 90 tablet 0   No current  facility-administered medications for this visit.     Allergies  Allergen Reactions  . Amiodarone Hives    ALL OVER BODY HIVES/ ITCH   . Aspirin Other (See Comments)    Gi bleed   . Lipitor [Atorvastatin Calcium] Other (See Comments)    MUSCLE ACHES  . Nsaids Other (See Comments)    GI bleed  . Tolmetin Other (See Comments)    GI bleed  . Naproxen Other (See Comments)  . Niacin Rash  . Niaspan [Niacin Er] Rash    Review of Systems negative except from HPI and PMH  Physical Exam BP 122/70   Pulse 75   Ht 5\' 4"  (1.626 m)   Wt 135 lb (61.2 kg)   SpO2 97%   BMI 23.17 kg/m  Well developed and nourished in no acute distress HENT normal Neck supple with JVP-flat Carotids brisk and full without bruits Clear Device pocket well healed; without hematoma or erythema.  There is no tethering  Regular rate and rhythm, no murmurs or gallops Abd-soft with active BS without hepatomegaly No Clubbing cyanosis edema Skin-warm and dry A & Oriented  Grossly normal sensory and motor function   ECG demonstrates apacing 724/08/40  Assessment and  Plan  Sinus node dysfunction/chronotropic incompetence  Atrial tachycardia  Hypertension    Atrial fibrillation  Pacemaker-Medtronic  The patient's device was interrogated.  The information was reviewed. No changes were made in the programming.     No intercurrent atrial fibrillation or flutter  On Anticoagulation;  No bleeding issues   Blood Pressure well controlled; indeed, may be a little bit on the low side. We will decrease her metoprolol from 50--25 mg twice daily

## 2017-03-08 NOTE — Patient Instructions (Signed)
Medication Instructions: Your physician has recommended you make the following change in your medication:  -1) DECREASE Metoprolol (Lopressor) - Take 1 tablet (25 mg) by mouth twice daily  Labwork: Your physician recommends that you have lab work today: CBC   Procedures/Testing: None Ordered  Follow-Up: Remote monitoring is used to monitor your Pacemaker from home. This monitoring reduces the number of office visits required to check your device to one time per year. It allows [place] to keep an eye on the functioning of your device to ensure it is working properly. You are scheduled for a device check from home on 06/07/17. You may send your transmission at any time that day. If you have a wireless device, the transmission will be sent automatically. After your physician reviews your transmission, you will receive a postcard with your next transmission date.   Your physician wants you to follow-up in 1 YEAR with Dr. Caryl Comes. You will receive a reminder letter in the mail two months in advance. If you don't receive a letter, please call our office to schedule the follow-up appointment.   If you need a refill on your cardiac medications before your next appointment, please call your pharmacy.

## 2017-03-14 ENCOUNTER — Telehealth: Payer: Self-pay

## 2017-03-14 LAB — CUP PACEART INCLINIC DEVICE CHECK
Battery Voltage: 2.99 V
Brady Statistic AP VP Percent: 0.19 %
Brady Statistic AP VS Percent: 38.92 %
Brady Statistic AS VS Percent: 60.86 %
Brady Statistic RA Percent Paced: 38.74 %
Brady Statistic RV Percent Paced: 0.25 %
Date Time Interrogation Session: 20180725140559
Implantable Lead Implant Date: 20130626
Implantable Lead Location: 753860
Lead Channel Pacing Threshold Amplitude: 1 V
Lead Channel Sensing Intrinsic Amplitude: 10.406 mV
Lead Channel Setting Pacing Amplitude: 2 V
Lead Channel Setting Pacing Amplitude: 2.5 V
Lead Channel Setting Pacing Pulse Width: 0.4 ms
Lead Channel Setting Sensing Sensitivity: 0.9 mV
MDC IDC LEAD IMPLANT DT: 20130626
MDC IDC LEAD LOCATION: 753859
MDC IDC MSMT LEADCHNL RA IMPEDANCE VALUE: 408 Ohm
MDC IDC MSMT LEADCHNL RA PACING THRESHOLD AMPLITUDE: 0.5 V
MDC IDC MSMT LEADCHNL RA PACING THRESHOLD PULSEWIDTH: 0.4 ms
MDC IDC MSMT LEADCHNL RA SENSING INTR AMPL: 0.933 mV
MDC IDC MSMT LEADCHNL RV IMPEDANCE VALUE: 416 Ohm
MDC IDC MSMT LEADCHNL RV PACING THRESHOLD PULSEWIDTH: 0.4 ms
MDC IDC PG IMPLANT DT: 20130626
MDC IDC STAT BRADY AS VP PERCENT: 0.03 %

## 2017-03-14 NOTE — Telephone Encounter (Signed)
Pt is aware and agreeable to normal results  

## 2017-03-22 DIAGNOSIS — N390 Urinary tract infection, site not specified: Secondary | ICD-10-CM | POA: Diagnosis not present

## 2017-03-22 DIAGNOSIS — M545 Low back pain: Secondary | ICD-10-CM | POA: Diagnosis not present

## 2017-03-24 DIAGNOSIS — Z79899 Other long term (current) drug therapy: Secondary | ICD-10-CM | POA: Diagnosis not present

## 2017-03-24 DIAGNOSIS — N3000 Acute cystitis without hematuria: Secondary | ICD-10-CM | POA: Diagnosis not present

## 2017-03-25 ENCOUNTER — Emergency Department (HOSPITAL_COMMUNITY)
Admission: EM | Admit: 2017-03-25 | Discharge: 2017-03-25 | Disposition: A | Discharge status: Home / Self Care | Payer: Medicare HMO | Attending: Emergency Medicine | Admitting: Emergency Medicine

## 2017-03-25 ENCOUNTER — Encounter (HOSPITAL_COMMUNITY): Payer: Self-pay

## 2017-03-25 ENCOUNTER — Emergency Department (HOSPITAL_COMMUNITY): Payer: Medicare HMO

## 2017-03-25 DIAGNOSIS — Z951 Presence of aortocoronary bypass graft: Secondary | ICD-10-CM | POA: Insufficient documentation

## 2017-03-25 DIAGNOSIS — S22080A Wedge compression fracture of T11-T12 vertebra, initial encounter for closed fracture: Secondary | ICD-10-CM | POA: Insufficient documentation

## 2017-03-25 DIAGNOSIS — I129 Hypertensive chronic kidney disease with stage 1 through stage 4 chronic kidney disease, or unspecified chronic kidney disease: Secondary | ICD-10-CM | POA: Diagnosis not present

## 2017-03-25 DIAGNOSIS — E785 Hyperlipidemia, unspecified: Secondary | ICD-10-CM | POA: Insufficient documentation

## 2017-03-25 DIAGNOSIS — S22000A Wedge compression fracture of unspecified thoracic vertebra, initial encounter for closed fracture: Secondary | ICD-10-CM

## 2017-03-25 DIAGNOSIS — Z79899 Other long term (current) drug therapy: Secondary | ICD-10-CM | POA: Diagnosis not present

## 2017-03-25 DIAGNOSIS — Y998 Other external cause status: Secondary | ICD-10-CM | POA: Diagnosis not present

## 2017-03-25 DIAGNOSIS — Z950 Presence of cardiac pacemaker: Secondary | ICD-10-CM | POA: Insufficient documentation

## 2017-03-25 DIAGNOSIS — N183 Chronic kidney disease, stage 3 (moderate): Secondary | ICD-10-CM | POA: Insufficient documentation

## 2017-03-25 DIAGNOSIS — M549 Dorsalgia, unspecified: Secondary | ICD-10-CM

## 2017-03-25 DIAGNOSIS — R1030 Lower abdominal pain, unspecified: Secondary | ICD-10-CM | POA: Diagnosis not present

## 2017-03-25 DIAGNOSIS — Y33XXXA Other specified events, undetermined intent, initial encounter: Secondary | ICD-10-CM | POA: Insufficient documentation

## 2017-03-25 DIAGNOSIS — M5136 Other intervertebral disc degeneration, lumbar region: Secondary | ICD-10-CM | POA: Diagnosis not present

## 2017-03-25 DIAGNOSIS — Y939 Activity, unspecified: Secondary | ICD-10-CM | POA: Diagnosis not present

## 2017-03-25 DIAGNOSIS — M545 Low back pain, unspecified: Secondary | ICD-10-CM

## 2017-03-25 DIAGNOSIS — I4891 Unspecified atrial fibrillation: Secondary | ICD-10-CM | POA: Insufficient documentation

## 2017-03-25 DIAGNOSIS — I2510 Atherosclerotic heart disease of native coronary artery without angina pectoris: Secondary | ICD-10-CM | POA: Insufficient documentation

## 2017-03-25 DIAGNOSIS — Y929 Unspecified place or not applicable: Secondary | ICD-10-CM | POA: Diagnosis not present

## 2017-03-25 DIAGNOSIS — R300 Dysuria: Secondary | ICD-10-CM | POA: Diagnosis present

## 2017-03-25 LAB — CBC WITH DIFFERENTIAL/PLATELET
BASOS ABS: 0 10*3/uL (ref 0.0–0.1)
BASOS PCT: 0 %
EOS ABS: 0 10*3/uL (ref 0.0–0.7)
EOS PCT: 0 %
HCT: 37.3 % (ref 36.0–46.0)
Hemoglobin: 12.5 g/dL (ref 12.0–15.0)
Lymphocytes Relative: 9 %
Lymphs Abs: 0.6 10*3/uL — ABNORMAL LOW (ref 0.7–4.0)
MCH: 30.9 pg (ref 26.0–34.0)
MCHC: 33.5 g/dL (ref 30.0–36.0)
MCV: 92.1 fL (ref 78.0–100.0)
Monocytes Absolute: 0.5 10*3/uL (ref 0.1–1.0)
Monocytes Relative: 7 %
Neutro Abs: 5.9 10*3/uL (ref 1.7–7.7)
Neutrophils Relative %: 84 %
PLATELETS: 176 10*3/uL (ref 150–400)
RBC: 4.05 MIL/uL (ref 3.87–5.11)
RDW: 12.9 % (ref 11.5–15.5)
WBC: 7 10*3/uL (ref 4.0–10.5)

## 2017-03-25 LAB — URINALYSIS, ROUTINE W REFLEX MICROSCOPIC
BACTERIA UA: NONE SEEN
Bilirubin Urine: NEGATIVE
Glucose, UA: NEGATIVE mg/dL
HGB URINE DIPSTICK: NEGATIVE
KETONES UR: 20 mg/dL — AB
Nitrite: NEGATIVE
PROTEIN: NEGATIVE mg/dL
RBC / HPF: NONE SEEN RBC/hpf (ref 0–5)
Specific Gravity, Urine: 1.016 (ref 1.005–1.030)
pH: 5 (ref 5.0–8.0)

## 2017-03-25 LAB — I-STAT CHEM 8, ED
BUN: 22 mg/dL — ABNORMAL HIGH (ref 6–20)
CALCIUM ION: 1.07 mmol/L — AB (ref 1.15–1.40)
Chloride: 93 mmol/L — ABNORMAL LOW (ref 101–111)
Creatinine, Ser: 0.9 mg/dL (ref 0.44–1.00)
GLUCOSE: 101 mg/dL — AB (ref 65–99)
HCT: 38 % (ref 36.0–46.0)
HEMOGLOBIN: 12.9 g/dL (ref 12.0–15.0)
Potassium: 4.2 mmol/L (ref 3.5–5.1)
SODIUM: 130 mmol/L — AB (ref 135–145)
TCO2: 26 mmol/L (ref 0–100)

## 2017-03-25 MED ORDER — ACETAMINOPHEN 500 MG PO TABS
1000.0000 mg | ORAL_TABLET | Freq: Once | ORAL | Status: DC
  Filled 2017-03-25: qty 2

## 2017-03-25 MED ORDER — MORPHINE SULFATE (PF) 4 MG/ML IV SOLN
2.0000 mg | Freq: Once | INTRAVENOUS | Status: AC
  Administered 2017-03-25: 2 mg via INTRAVENOUS
  Filled 2017-03-25: qty 1

## 2017-03-25 MED ORDER — MORPHINE SULFATE 15 MG PO TABS: 15.0000 mg | ORAL_TABLET | ORAL | 0 refills | Status: DC | PRN

## 2017-03-25 MED ORDER — ONDANSETRON HCL 4 MG/2ML IJ SOLN
4.0000 mg | Freq: Once | INTRAMUSCULAR | Status: AC
  Administered 2017-03-25: 4 mg via INTRAVENOUS
  Filled 2017-03-25: qty 2

## 2017-03-25 MED ORDER — SODIUM CHLORIDE 0.9 % IV BOLUS (SEPSIS)
1000.0000 mL | Freq: Once | INTRAVENOUS | Status: AC
  Administered 2017-03-25: 1000 mL via INTRAVENOUS

## 2017-03-25 NOTE — ED Notes (Signed)
Got patient hooked up to the monitor patient is resting with family at bedside call bell in reach

## 2017-03-25 NOTE — ED Triage Notes (Signed)
PT reports decreased urine out put over past few days.

## 2017-03-25 NOTE — ED Provider Notes (Signed)
[facility] DEPT Provider Note   CSN: [id number] Arrival date & time: 03/25/17  0258     History   Chief Complaint Chief Complaint  Patient presents with  . Dysuria    HPI Donna Ortega is a 83 y.o. female.  83 yo F with a cc of dysuria.  Going on for about 4 days.  Started on cefixime. Changed to cipro a couple days ago.  Continues to have back pain.  Denies fevers, chills.    The history is provided by the patient.  Illness  This is a new problem. The current episode started 2 days ago. The problem occurs constantly. The problem has not changed since onset.Pertinent negatives include no chest pain, no headaches and no shortness of breath. Nothing aggravates the symptoms. Nothing relieves the symptoms. She has tried nothing for the symptoms. The treatment provided no relief.    Past Medical History:  Diagnosis Date  . Arthritis    osteo   in back  . Coronary artery disease 2007   3 vessel CABG with LIMA-LAD, SVG to diag, and SVG - Ramus  . Diverticulitis   . GI bleeding 09/09/2013  . Hearing loss of left ear 07/2013  . History of atrial fibrillation; review of all ECG are most consistent with atrial tachycardia    had rash with amiodarone; no anticoagulation due to GI bleed in December 2012  . History of GI bleed Dec 2012  . History of hyperkalemia   . Hyperlipidemia   . Hypertension   . Hyponatremia   . Pacemaker-Medtronic 02/09/2012  . Palpitations     Patient Active Problem List   Diagnosis Date Noted  . Edema 04/09/2014  . Vertebral compression fracture ([place]) 10/29/2013  . Compressed spine fracture ([place]) 10/29/2013  . Gastric erosions 10/09/2013  . Decreased hearing of both ears 10/09/2013  . Anemia 06/26/2013  . Osteoporosis  Fosamax restarted 10/2013 06/18/2013  . Osteopenia 05/05/2013  . Palpitation 02/19/2013  . Insomnia 02/18/2013  . Candida esophagitis ([place]) 01/22/2013  . Gastric ulcer 01/22/2013  . UTI (urinary tract infection) 01/17/2013  .  HLD (hyperlipidemia) 06/19/2012  . Atrial tachycardia ([place]) 05/22/2012  . Pacemaker-Medtronic 02/09/2012  . Sinus node dysfunction ([place]) 02/06/2012  . Syncope and collapse 02/06/2012  . Benign hypertension 09/27/2011  . Rash 09/19/2011  . UTI (lower urinary tract infection) 08/21/2011  . Fatigue 08/21/2011  . Peptic ulcer disease with hemorrhage 08/14/2011  . S/P vertebroplasty 08/14/2011  . H/O Spinal surgery 08/14/2011  . Chronic kidney disease (CKD), stage III (moderate) 08/05/2011  . Anemia associated with acute blood loss 08/04/2011  . GI bleed 08/04/2011  . Dyslipidemia 08/04/2011  . Melena 08/03/2011  . CAD (coronary artery disease) s/p CABG 01/11/2011  . Atrial fibrillation/flutter 01/11/2011  . HTN (hypertension) 01/11/2011  . Arteriosclerosis of coronary artery 01/11/2011    Past Surgical History:  Procedure Laterality Date  . ABDOMINAL HYSTERECTOMY    . COLONOSCOPY N/A 09/12/2013   Procedure: COLONOSCOPY;  Surgeon: Wonda Horner, MD;  Location: [facility] ENDOSCOPY;  Service: Endoscopy;  Laterality: N/A;  . CORONARY ARTERY BYPASS GRAFT  Feb 2007   Dr. Cyndia Bent;   . ESOPHAGOGASTRODUODENOSCOPY  08/04/2011   Procedure: ESOPHAGOGASTRODUODENOSCOPY (EGD);  Surgeon: Cleotis Nipper, MD;  Location: [facility] ENDOSCOPY;  Service: Endoscopy;  Laterality: N/A;  do at bedside  . ESOPHAGOGASTRODUODENOSCOPY N/A 09/11/2013   Procedure: ESOPHAGOGASTRODUODENOSCOPY (EGD);  Surgeon: Jeryl Columbia, MD;  Location: [facility] ENDOSCOPY;  Service: Endoscopy;  Laterality: N/A;  . MAZE    .  pace Agricultural engineer    . PARTIAL HYSTERECTOMY    . PERMANENT PACEMAKER INSERTION N/A 02/08/2012   Procedure: PERMANENT PACEMAKER INSERTION;  Surgeon: Deboraha Sprang, MD;  Location: [facility] CATH LAB;  Service: Cardiovascular;  Laterality: N/A;    OB History    No data available       Home Medications    Prior to Admission medications   Medication Sig Start Date End Date Taking? Authorizing Provider  alendronate (FOSAMAX) 70 MG tablet  Take 70 mg by mouth once a week. Take on Thursday with a full glass of water on an empty stomach.    [provider]  Calcium Carbonate-Vitamin D (CALTRATE 600+D PO) Take 1 tablet by mouth daily.     [provider]  diltiazem (CARDIZEM CD) 240 MG 24 hr capsule TAKE 1 CAPSULE (240 MG TOTAL) BY MOUTH DAILY. 11/25/16   Nahser, Wonda Cheng, MD  diphenhydramine-acetaminophen (TYLENOL PM) 25-500 MG TABS tablet Take 2 tablets by mouth at bedtime.    [provider]  ELIQUIS 2.5 MG TABS tablet TAKE 1 TABLET BY MOUTH TWICE A DAY 07/26/16   Nahser, Wonda Cheng, MD  metoprolol tartrate (LOPRESSOR) 25 MG tablet Take 1 tablet (25 mg total) by mouth 2 (two) times daily. 03/08/17 06/06/17  Deboraha Sprang, MD  morphine (MSIR) 15 MG tablet Take 1 tablet (15 mg total) by mouth every 4 (four) hours as needed for severe pain. 03/25/17   Deno Etienne, DO  Multiple Vitamins-Minerals (CENTRUM SILVER ADULT 50+ PO) Take 1 tablet by mouth daily.    [provider]  pantoprazole (PROTONIX) 40 MG tablet TAKE 1 TABLET (40 MG TOTAL) BY MOUTH DAILY. 09/17/14   Schoenhoff, Altamese Cabal, MD  pravastatin (PRAVACHOL) 80 MG tablet TAKE 1 TABLET (80 MG TOTAL) BY MOUTH DAILY. 05/25/15   Nahser, Wonda Cheng, MD    Family History Family History  Problem Relation Age of Onset  . Heart failure Father   . Kidney failure Father   . Bone cancer Brother   . Kidney failure Sister   . Colon cancer Sister   . Cancer Sister     Social History Social History  Substance Use Topics  . Smoking status: Never Smoker  . Smokeless tobacco: Never Used  . Alcohol use No     Allergies   Amiodarone; Aspirin; Lipitor [atorvastatin calcium]; Nsaids; Tolmetin; Naproxen; Niacin; and Niaspan [niacin er]   Review of Systems Review of Systems  Constitutional: Negative for chills and fever.  HENT: Negative for congestion and rhinorrhea.   Eyes: Negative for redness and visual disturbance.  Respiratory: Negative for shortness  of breath and wheezing.   Cardiovascular: Negative for chest pain and palpitations.  Gastrointestinal: Negative for nausea and vomiting.  Genitourinary: Positive for flank pain. Negative for dysuria and urgency.  Musculoskeletal: Negative for arthralgias and myalgias.  Skin: Negative for pallor and wound.  Neurological: Negative for dizziness and headaches.     Physical Exam Updated Vital Signs BP 138/69 (BP Location: Right Arm)   Pulse 83   Temp 98.3 F (36.8 C) (Oral)   Resp 20   SpO2 96%   Physical Exam  Constitutional: She is oriented to person, place, and time. She appears well-developed and well-nourished. No distress.  HENT:  Head: Normocephalic and atraumatic.  Eyes: Pupils are equal, round, and reactive to light. EOM are normal.  Neck: Normal range of motion. Neck supple.  Cardiovascular: Normal rate and regular rhythm.  Exam reveals no gallop and no  friction rub.   No murmur heard. Pulmonary/Chest: Effort normal. She has no wheezes. She has no rales.  Abdominal: Soft. She exhibits no distension. There is no tenderness.  Musculoskeletal: She exhibits no edema or tenderness.  Mild tenderness about the midline lower T and upper L-spine.  No noted clonus. Reflexes are 2+ and equal bilaterally. Negative Babinski.  Neurological: She is alert and oriented to person, place, and time.  Skin: Skin is warm and dry. She is not diaphoretic.  Psychiatric: She has a normal mood and affect. Her behavior is normal.  Nursing note and vitals reviewed.    ED Treatments / Results  Labs (all labs ordered are listed, but only abnormal results are displayed) Labs Reviewed  URINALYSIS, ROUTINE W REFLEX MICROSCOPIC - Abnormal; Notable for the following:       Result Value   APPearance HAZY (*)    Ketones, ur 20 (*)    Leukocytes, UA LARGE (*)    Squamous Epithelial / LPF 6-30 (*)    Non Squamous Epithelial 0-5 (*)    All other components within normal limits  CBC WITH  DIFFERENTIAL/PLATELET - Abnormal; Notable for the following:    Lymphs Abs 0.6 (*)    All other components within normal limits  I-STAT CHEM 8, ED - Abnormal; Notable for the following:    Sodium 130 (*)    Chloride 93 (*)    BUN 22 (*)    Glucose, Bld 101 (*)    Calcium, Ion 1.07 (*)    All other components within normal limits  URINE CULTURE    EKG  EKG Interpretation None       Radiology Ct L-spine No Charge  Result Date: 03/25/2017 CLINICAL DATA:  Low back pain. History of multiple compression fractures. EXAM: CT LUMBAR SPINE WITHOUT CONTRAST TECHNIQUE: Multidetector CT imaging of the lumbar spine was performed without intravenous contrast administration. Multiplanar CT image reconstructions were also generated. COMPARISON:  CT scan 08/30/2015 FINDINGS: Segmentation: 5 lumbar type vertebral bodies. The last full intervertebral disc space is labeled L5-S1. This correlates with the prior CT scans. Alignment: Normal overall alignment. There is mild degenerative anterolisthesis of L4. Vertebrae: Remote severe compression fracture of L3 with vertebral plana appearance and stable retropulsion of approximately 7 mm. Stable vertebral augmentation changes at L1 without complicating features. There is a new compression fracture T12 when compared the prior CT scan. This could be acute or subacute. Mild paraspinal edema is noted. Remote compression fracture of T10 without progressive findings. The L2 and L5 vertebral bodies appear normal in stable. Mild stable compression deformity of L4. The facets are normally aligned. Stable facet disease but no pars defects. Paraspinal and other soft tissues: No significant findings. Stable advanced atherosclerotic calcifications involving the aorta and branch vessel ostia. Disc levels: T11-12: Minimal retropulsion of T12 but no canal compromise. No spinal or foraminal stenosis. T12-L1: No disc protrusion, spinal or foraminal stenosis. Stable retropulsion of L1  but no significant canal stenosis. L1-2:  Mild bulging annulus but no spinal or foraminal stenosis. The L2-3: Mild bulging annulus with a shallow rim calcified disc protrusion on the right. There is mild right lateral recess and right foraminal encroachment. Significant retropulsion of L3 with canal stenosis which is stable. L3-4: Annular bulge but no significant spinal or foraminal stenosis. L4-5: Bulging and uncovered disc and osteophytic ridging with mild spinal and lateral recess stenosis. No significant foraminal stenosis. L5-S1:  No significant spinal or foraminal stenosis. IMPRESSION: 1. Remote compression fractures of  T10, L1, L3 and L4. Stable vertebral augmentation changes at L1. 2. New compression fracture of T12 since the prior study. This could be acute or subacute. No significant retropulsion or canal compromise. 3. Stable multilevel disc disease and facet disease without significant spinal or foraminal stenosis. Electronically Signed   By: Marijo Sanes M.D.   On: 03/25/2017 12:25   Ct Renal Stone Study  Result Date: 03/25/2017 CLINICAL DATA:  Lower abdominal pain and back pain currently being treated for UTI. EXAM: CT ABDOMEN AND PELVIS WITHOUT CONTRAST TECHNIQUE: Multidetector CT imaging of the abdomen and pelvis was performed following the standard protocol without IV contrast. COMPARISON:  Chest CT 10/25/2013 and thoracolumbar spine CT 08/30/2015 FINDINGS: Lower chest: Lung bases are unremarkable. Median sternotomy wires are present. Cardiac pacer leads are present. Atherosclerotic coronary artery disease post CABG. Hepatobiliary: Liver and biliary tree are within normal. Gallbladder is unremarkable. Pancreas: Within normal. Spleen: Within normal. Adrenals/Urinary Tract: Adrenal glands are normal. Kidneys normal in size without hydronephrosis or nephrolithiasis. Ureters and bladder are normal. Stomach/Bowel: Stomach and small bowel are within normal. Appendix is normal. Moderate diverticulosis  throughout the colon most prominent over the sigmoid colon. Vascular/Lymphatic: Moderate calcified plaque over the abdominal aorta and iliac arteries. No adenopathy. Reproductive: Previous hysterectomy. Other: No free fluid or focal inflammatory change. Musculoskeletal: Degenerate changes spine. Stable severe compression fracture of T10 with mild interval progression of moderate T12 compression fracture. Stable moderate compression fracture of L1 post kyphoplasty. Stable severe L3 compression fracture and stable moderate L4 compression fracture. Stable retropulsion of the posterior aspect of the L3 compression fracture with mild resulting canal stenosis unchanged. Subtle grade 1 anterolisthesis of L4 on L5 unchanged. IMPRESSION: No acute findings in the abdomen/pelvis. Significant diverticulosis throughout the colon without active inflammation. Numerous stable compression fractures of the thoracolumbar spine as described with slight interval progression of the T12 fracture. Stable grade 1 anterolisthesis of L4 on L5. Stable mild canal stenosis at the L3 level due to retropulsion of the posterior border of the associated compression fracture. Post CABG. Aortic Atherosclerosis (ICD10-I70.0). Electronically Signed   By: Marin Olp M.D.   On: 03/25/2017 12:08    Procedures Procedures (including critical care time)  Medications Ordered in ED Medications  acetaminophen (TYLENOL) tablet 1,000 mg (1,000 mg Oral Not Given 03/25/17 1145)  sodium chloride 0.9 % bolus 1,000 mL (0 mLs Intravenous Stopped 03/25/17 1326)  ondansetron (ZOFRAN) injection 4 mg (4 mg Intravenous Given 03/25/17 1142)  morphine 4 MG/ML injection 2 mg (2 mg Intravenous Given 03/25/17 1143)     Initial Impression / Assessment and Plan / ED Course  I have reviewed the triage vital signs and the nursing notes.  Pertinent labs & imaging results that were available during my care of the patient were reviewed by me and considered in my medical  decision making (see chart for details).     83 yo F with a cc of dysuria.  Going on for past week.  Two different med changes without improvement.   The patient does have some midline spinal tenderness on my exam. I suspect that she has 2 separate issues once she is not taking enough by mouth and the other is that she may have some acute muscular skeletal pathology. Due to her advanced age and prior compression fractures will obtain a CT stone study as well as a CT of the L-spine.  Imaging with a acute versus subacute fracture of T12. I discussed results with the family. They  are to have orthopedic follow-up for this. They also are to have a brace. Given a small amount of pain medicine. We'll have her follow-up with her spine physician as well as her PCP.  2:51 PM:  I have discussed the diagnosis/risks/treatment options with the patient and family and believe the pt to be eligible for discharge home to follow-up with Ortho, PCP. We also discussed returning to the ED immediately if new or worsening sx occur. We discussed the sx which are most concerning (e.g., sudden worsening pain, fever, cauda equina s/sx) that necessitate immediate return. Medications administered to the patient during their visit and any new prescriptions provided to the patient are listed below.  Medications given during this visit Medications  acetaminophen (TYLENOL) tablet 1,000 mg (1,000 mg Oral Not Given 03/25/17 1145)  sodium chloride 0.9 % bolus 1,000 mL (0 mLs Intravenous Stopped 03/25/17 1326)  ondansetron (ZOFRAN) injection 4 mg (4 mg Intravenous Given 03/25/17 1142)  morphine 4 MG/ML injection 2 mg (2 mg Intravenous Given 03/25/17 1143)     The patient appears reasonably screen and/or stabilized for discharge and I doubt any other medical condition or other [facility] requiring further screening, evaluation, or treatment in the ED at this time prior to discharge.    Final Clinical Impressions(s) / ED Diagnoses   Final  diagnoses:  Acute midline low back pain without sciatica  Compression fracture of body of thoracic vertebra [facility])    New Prescriptions Discharge Medication List as of 03/25/2017  1:20 PM    START taking these medications   Details  morphine (MSIR) 15 MG tablet Take 1 tablet (15 mg total) by mouth every 4 (four) hours as needed for severe pain., Starting Sat 03/25/2017, Print         [place], Linna Hoff, DO 03/25/17 1451

## 2017-03-25 NOTE — Discharge Instructions (Signed)
Take tylenol 1000mg(2 extra strength) four times a day.  ° °Then take the pain medicine if you feel like you need it. Narcotics do not help with the pain, they only make you care about it less.  You can become addicted to this, people may break into your house to steal it.  It will constipate you.  If you drive under the influence of this medicine you can get a DUI.   ° °

## 2017-03-25 NOTE — ED Notes (Signed)
Patient is resting with call bell in reach family is at bedside

## 2017-03-25 NOTE — ED Triage Notes (Signed)
Patient complains of ongoing dysuria and bladder pressure. Has been seen by primary MD and a prime care for same and started on Cipro yesterday. Patient states that she feels as if she isn't emptying her bladder. Complains of back pain with movement

## 2017-03-25 NOTE — ED Notes (Signed)
Hooked patient up to the wick vacuum so patient want have to get up and down to the bathroom

## 2017-03-26 LAB — URINE CULTURE

## 2017-03-27 ENCOUNTER — Emergency Department (HOSPITAL_COMMUNITY): Payer: Medicare HMO

## 2017-03-27 ENCOUNTER — Emergency Department (HOSPITAL_COMMUNITY)
Admission: EM | Admit: 2017-03-27 | Discharge: 2017-03-27 | Disposition: A | Discharge status: Home / Self Care | Payer: Medicare HMO | Attending: Emergency Medicine | Admitting: Emergency Medicine

## 2017-03-27 DIAGNOSIS — R410 Disorientation, unspecified: Secondary | ICD-10-CM | POA: Diagnosis not present

## 2017-03-27 DIAGNOSIS — R531 Weakness: Secondary | ICD-10-CM | POA: Diagnosis not present

## 2017-03-27 DIAGNOSIS — I129 Hypertensive chronic kidney disease with stage 1 through stage 4 chronic kidney disease, or unspecified chronic kidney disease: Secondary | ICD-10-CM | POA: Diagnosis not present

## 2017-03-27 DIAGNOSIS — I2510 Atherosclerotic heart disease of native coronary artery without angina pectoris: Secondary | ICD-10-CM | POA: Insufficient documentation

## 2017-03-27 DIAGNOSIS — R404 Transient alteration of awareness: Secondary | ICD-10-CM | POA: Diagnosis not present

## 2017-03-27 DIAGNOSIS — Z79899 Other long term (current) drug therapy: Secondary | ICD-10-CM | POA: Insufficient documentation

## 2017-03-27 DIAGNOSIS — N183 Chronic kidney disease, stage 3 (moderate): Secondary | ICD-10-CM | POA: Diagnosis not present

## 2017-03-27 DIAGNOSIS — S32000A Wedge compression fracture of unspecified lumbar vertebra, initial encounter for closed fracture: Secondary | ICD-10-CM | POA: Diagnosis not present

## 2017-03-27 LAB — COMPREHENSIVE METABOLIC PANEL
ALBUMIN: 4 g/dL (ref 3.5–5.0)
ALT: 12 U/L — AB (ref 14–54)
AST: 24 U/L (ref 15–41)
Alkaline Phosphatase: 53 U/L (ref 38–126)
Anion gap: 11 (ref 5–15)
BUN: 15 mg/dL (ref 6–20)
CHLORIDE: 94 mmol/L — AB (ref 101–111)
CO2: 24 mmol/L (ref 22–32)
CREATININE: 1.01 mg/dL — AB (ref 0.44–1.00)
Calcium: 8.6 mg/dL — ABNORMAL LOW (ref 8.9–10.3)
GFR calc Af Amer: 56 mL/min — ABNORMAL LOW (ref >60)
GFR, EST NON AFRICAN AMERICAN: 48 mL/min — AB (ref >60)
GLUCOSE: 96 mg/dL (ref 65–99)
POTASSIUM: 4 mmol/L (ref 3.5–5.1)
Sodium: 129 mmol/L — ABNORMAL LOW (ref 135–145)
Total Bilirubin: 0.6 mg/dL (ref 0.3–1.2)
Total Protein: 6.7 g/dL (ref 6.5–8.1)

## 2017-03-27 LAB — CBC WITH DIFFERENTIAL/PLATELET
Basophils Absolute: 0 10*3/uL (ref 0.0–0.1)
Basophils Relative: 0 %
EOS ABS: 0 10*3/uL (ref 0.0–0.7)
EOS PCT: 0 %
HCT: 35.4 % — ABNORMAL LOW (ref 36.0–46.0)
Hemoglobin: 12.1 g/dL (ref 12.0–15.0)
LYMPHS ABS: 1.2 10*3/uL (ref 0.7–4.0)
LYMPHS PCT: 15 %
MCH: 31.2 pg (ref 26.0–34.0)
MCHC: 34.2 g/dL (ref 30.0–36.0)
MCV: 91.2 fL (ref 78.0–100.0)
MONO ABS: 1 10*3/uL (ref 0.1–1.0)
MONOS PCT: 12 %
Neutro Abs: 5.9 10*3/uL (ref 1.7–7.7)
Neutrophils Relative %: 73 %
PLATELETS: 199 10*3/uL (ref 150–400)
RBC: 3.88 MIL/uL (ref 3.87–5.11)
RDW: 12.7 % (ref 11.5–15.5)
WBC: 8.1 10*3/uL (ref 4.0–10.5)

## 2017-03-27 LAB — URINALYSIS, ROUTINE W REFLEX MICROSCOPIC
BILIRUBIN URINE: NEGATIVE
GLUCOSE, UA: NEGATIVE mg/dL
HGB URINE DIPSTICK: NEGATIVE
KETONES UR: 5 mg/dL — AB
Leukocytes, UA: NEGATIVE
NITRITE: NEGATIVE
PH: 5 (ref 5.0–8.0)
Protein, ur: NEGATIVE mg/dL
SPECIFIC GRAVITY, URINE: 1.01 (ref 1.005–1.030)

## 2017-03-27 LAB — I-STAT TROPONIN, ED: TROPONIN I, POC: 0.01 ng/mL (ref 0.00–0.08)

## 2017-03-27 MED ORDER — MORPHINE SULFATE (PF) 4 MG/ML IV SOLN: 2.0000 mg | Freq: Once | INTRAVENOUS | Status: DC

## 2017-03-27 MED ORDER — OXYCODONE HCL 5 MG PO TABS: 5.0000 mg | ORAL_TABLET | ORAL | 0 refills | Status: DC | PRN

## 2017-03-27 MED ORDER — MORPHINE SULFATE (PF) 4 MG/ML IV SOLN
4.0000 mg | Freq: Once | INTRAVENOUS | Status: AC
  Administered 2017-03-27: 4 mg via INTRAVENOUS
  Filled 2017-03-27: qty 1

## 2017-03-27 NOTE — ED Notes (Signed)
Patient transported to X-ray 

## 2017-03-27 NOTE — ED Notes (Signed)
Patient and family aware of game plan for home health and possible rehab.  Patient asked to get dressed.  Okay'd with MD.  Patient able to dress with 1 assist, able to ambulate with support from cane.  Patient able to use the bedside commode with this RN present.

## 2017-03-27 NOTE — Care Management Note (Signed)
Case Management Note  Patient Details  Name: Donna Ortega MRN: [id number] Date of Birth: 12-07-1925  Subjective/Objective:         Patient presented to [facility] ED with c/o severe back pain           Action/Plan: CM met with patient and son Donna Ortega [phone].  Patient was recently seen in the ED for the similar issue.  Patient was evaluated in the ED today no significant findings noted. CM discussed the recommendations for [facility] services RN,PT,OT,SW patient and son are agreeable. Offered choice [facility] selected.Marland Kitchen Referral faxed via [facility] hub. Patient and son informed  24-48 hours a nurse will contact patient at verified phone number. Patient and son verbalized understanding teach back done. No further questions or concerns verbalized. No additional ED CM needs noted  Expected Discharge Date:       03/27/2017          Expected Discharge Plan:  [place]  In-House Referral:     Discharge planning Services  CM Consult  Post Acute Care Choice:    Choice offered to:  Patient, Adult Children  DME Arranged:    DME Agency:  NA  HH Arranged:  RN, OT, PT, Social Work [employer] Agency:  [place]  Status of Service:  Completed, signed off  If discussed at [employer], dates discussed:    Additional CommentsLaurena Slimmer, RN 03/27/2017, 10:04 PM

## 2017-03-27 NOTE — ED Triage Notes (Addendum)
Was seen here for back pain (nontruamatic) recently; was supposed to see Dr. Cay Schillings for possible surgery (vertibral plasty). She saw PCP doc last Friday. She has hyponatremia and UTI. Daughter reports increased confusion. Nonambulatory bc of pain. Takes morphine at home for pain. EMS did not find patient was confused.

## 2017-03-27 NOTE — ED Notes (Signed)
Son at bedside states urine culture came back clean. She reports last BM was Saturday or before.

## 2017-03-27 NOTE — ED Notes (Addendum)
Pt returned from xray and used bedpan then taken to CT.

## 2017-03-27 NOTE — ED Notes (Signed)
Patient able to ambulate independently  

## 2017-03-27 NOTE — ED Notes (Signed)
Son reports he gave her 2 morphine pills yesterday. RN provided education regarding pain management.

## 2017-03-27 NOTE — Clinical Social Work Note (Signed)
Clinical Social Work Assessment  Patient Details  Name: Donna Ortega MRN: [id number] Date of Birth: 01-03-1926  Date of referral:  03/27/17               Reason for consult:  Facility Placement                Permission sought to share information with:  Family Supports Permission granted to share information::  Yes, Verbal Permission Granted  Name::        Agency::     Relationship::     Contact Information:     Housing/Transportation Living arrangements for the past 2 months:  Single Family Home (with son [name] and his wife and children.) Source of Information:  Adult Children (son Surveyor, minerals) Patient Interpreter Needed:  None Criminal Activity/Legal Involvement Pertinent to Current Situation/Hospitalization:  No - Comment as needed Significant Relationships:  Adult Children Lives with:  Adult Children Do you feel safe going back to the place where you live?  Yes Need for family participation in patient care:  Yes (Comment)  Care giving concerns:  CSW and RN CM spoke with pt and son [name]) at bedside. CSW and RN CM spoke with son and pt about the current needs of pt. Pt's son is currently concerns about pt's pain and ways to better manage it as pt was just seen here in the ED on Saturday 8/11.    Social Worker assessment / plan:  CSW and RN CM spoke with pt and pt's son at bedside. Son informed CSW that he and his wife are the primary care takers for pt. Pt lives in their basement apartment in their home. Pt's son went on to inform CSW that he is wanting to get pt's back pain and bladder back on track so that pt can get back to baseline in caring for self.   Employment status:  Retired Nurse, adult PT Recommendations:  Not assessed at this time Information / Referral to community resources:  Acute Rehab  Patient/Family's Response to care:  Pt's son is understanding and agreeable to [facility] at this time.   Patient/Family's Understanding of and Emotional Response  to Diagnosis, Current Treatment, and Prognosis:  No further concerns or comments have been presented to CSW at this time.  Emotional Assessment Appearance:  Appears stated age Attitude/Demeanor/Rapport:    Affect (typically observed):  Calm, Quiet Orientation:  Oriented to Self, Oriented to Place, Oriented to  Time, Oriented to Situation Alcohol / Substance use:  Not Applicable Psych involvement (Current and /or in the community):  No (Comment)  Discharge Needs  Concerns to be addressed:  No discharge needs identified Readmission within the last 30 days:  Yes Current discharge risk:  None Barriers to Discharge:  No Barriers Identified   Wetzel Bjornstad, [place] 03/27/2017, 6:10 PM

## 2017-03-27 NOTE — Progress Notes (Signed)
CSW and RN CM spoke with both pt and pt's son at bedside to discuss pt needs at this time. CSW was informed that pt has been living with son [name]) and his family as they have been primary care providers for pt over the past years. CM RN spoke with son particularly about [facility] coming to the home to assess pt needs further. Pt's son was agreeable to this as it may help the family of pt towards short term rehab. CSW explained barriers to placing pt from the ED at this time such as the potential wait period to hear back from facilities. Son was understanding and presented no further questions to CSW at this time.

## 2017-03-27 NOTE — ED Notes (Signed)
Case Manager at bedside

## 2017-03-27 NOTE — Discharge Instructions (Signed)
Please read and follow all provided instructions.  Your diagnoses today include:  1. Generalized weakness     Tests performed today include:  Blood counts and electrolytes - slightly low sodium, but not too low  Vital signs. See below for your results today.   Medications prescribed:   Oxycodone - narcotic pain medication  DO NOT drive or perform any activities that require you to be awake and alert because this medicine can make you drowsy.   Take any prescribed medications only as directed.  Home care instructions:  Follow any educational materials contained in this packet.  BE VERY CAREFUL not to take multiple medicines containing Tylenol (also called acetaminophen). Doing so can lead to an overdose which can damage your liver and cause liver failure and possibly death.   Follow-up instructions: Please follow-up with your primary care provider in the next 3 days for further evaluation of your symptoms.   Return instructions:   Please return to the Emergency Department if you experience worsening symptoms.   Please return if you have any other emergent concerns.  Additional Information:  Your vital signs today were: BP (!) 146/77    Pulse (!) 106    Temp 98.2 F (36.8 C) (Oral)    Resp 15    SpO2 95%  If your blood pressure (BP) was elevated above 135/85 this visit, please have this repeated by your doctor within one month. --------------

## 2017-03-27 NOTE — ED Notes (Signed)
EDP at bedside  

## 2017-03-27 NOTE — ED Notes (Addendum)
245ml post void residual, EDP made aware

## 2017-03-27 NOTE — ED Provider Notes (Signed)
[facility] DEPT Provider Note   CSN: [id number] Arrival date & time: 03/27/17  1345     History   Chief Complaint No chief complaint on file.   HPI Donna Ortega is a 83 y.o. female.  HPI  Patient presents to ED for evaluation of increased confusion, weakness, hyponatremia.  Patient was recently diagnosed with UTI 2 days ago. She was placed on ciprofloxacin but son states that her cultures came back as normal, so they were told to discontinue the antibiotics. She continues to have urinary urgency but is unable to urinate. She also reports no bowel movement in 2 days. She was seen at her PCP 3 days ago and was informed of the lab results today which showed hyponatremia at 126. Her son and daughter-in-law reports increased confusion. Patient has a history of nontraumatic compression fracture as diagnosed 5 days ago. She was given morphine for pain however son states that she only takes it sparingly as needed for severe pain. She continues to be nonambulatory due to the pain. She denies any falls, injuries, chest pain, shortness of breath, abdominal pain, nausea, vomiting, weight changes.  Past Medical History:  Diagnosis Date  . Arthritis    osteo   in back  . Coronary artery disease 2007   3 vessel CABG with LIMA-LAD, SVG to diag, and SVG - Ramus  . Diverticulitis   . GI bleeding 09/09/2013  . Hearing loss of left ear 07/2013  . History of atrial fibrillation; review of all ECG are most consistent with atrial tachycardia    had rash with amiodarone; no anticoagulation due to GI bleed in December 2012  . History of GI bleed Dec 2012  . History of hyperkalemia   . Hyperlipidemia   . Hypertension   . Hyponatremia   . Pacemaker-Medtronic 02/09/2012  . Palpitations     Patient Active Problem List   Diagnosis Date Noted  . Edema 04/09/2014  . Vertebral compression fracture ([place]) 10/29/2013  . Compressed spine fracture ([place]) 10/29/2013  . Gastric erosions 10/09/2013  .  Decreased hearing of both ears 10/09/2013  . Anemia 06/26/2013  . Osteoporosis  Fosamax restarted 10/2013 06/18/2013  . Osteopenia 05/05/2013  . Palpitation 02/19/2013  . Insomnia 02/18/2013  . Candida esophagitis ([place]) 01/22/2013  . Gastric ulcer 01/22/2013  . UTI (urinary tract infection) 01/17/2013  . HLD (hyperlipidemia) 06/19/2012  . Atrial tachycardia ([place]) 05/22/2012  . Pacemaker-Medtronic 02/09/2012  . Sinus node dysfunction ([place]) 02/06/2012  . Syncope and collapse 02/06/2012  . Benign hypertension 09/27/2011  . Rash 09/19/2011  . UTI (lower urinary tract infection) 08/21/2011  . Fatigue 08/21/2011  . Peptic ulcer disease with hemorrhage 08/14/2011  . S/P vertebroplasty 08/14/2011  . H/O Spinal surgery 08/14/2011  . Chronic kidney disease (CKD), stage III (moderate) 08/05/2011  . Anemia associated with acute blood loss 08/04/2011  . GI bleed 08/04/2011  . Dyslipidemia 08/04/2011  . Melena 08/03/2011  . CAD (coronary artery disease) s/p CABG 01/11/2011  . Atrial fibrillation/flutter 01/11/2011  . HTN (hypertension) 01/11/2011  . Arteriosclerosis of coronary artery 01/11/2011    Past Surgical History:  Procedure Laterality Date  . ABDOMINAL HYSTERECTOMY    . COLONOSCOPY N/A 09/12/2013   Procedure: COLONOSCOPY;  Surgeon: Wonda Horner, MD;  Location[facility];  Service: Endoscopy;  Laterality: N/A;  . CORONARY ARTERY BYPASS GRAFT  Feb 2007   Dr. Cyndia Bent;   . ESOPHAGOGASTRODUODENOSCOPY  08/04/2011   Procedure: ESOPHAGOGASTRODUODENOSCOPY (EGD);  Surgeon: Cleotis Nipper, MD;  Location:  [facility] ENDOSCOPY;  Service: Endoscopy;  Laterality: N/A;  do at bedside  . ESOPHAGOGASTRODUODENOSCOPY N/A 09/11/2013   Procedure: ESOPHAGOGASTRODUODENOSCOPY (EGD);  Surgeon: Jeryl Columbia, MD;  Location: [facility] ENDOSCOPY;  Service: Endoscopy;  Laterality: N/A;  . MAZE    . pace maker    . PARTIAL HYSTERECTOMY    . PERMANENT PACEMAKER INSERTION N/A 02/08/2012   Procedure: PERMANENT PACEMAKER  INSERTION;  Surgeon: Deboraha Sprang, MD;  Location: [facility] CATH LAB;  Service: Cardiovascular;  Laterality: N/A;    OB History    No data available       Home Medications    Prior to Admission medications   Medication Sig Start Date End Date Taking? Authorizing Provider  alendronate (FOSAMAX) 70 MG tablet Take 70 mg by mouth once a week. Take on Thursday with a full glass of water on an empty stomach.    [provider]  Calcium Carbonate-Vitamin D (CALTRATE 600+D PO) Take 1 tablet by mouth daily.     [provider]  diltiazem (CARDIZEM CD) 240 MG 24 hr capsule TAKE 1 CAPSULE (240 MG TOTAL) BY MOUTH DAILY. 11/25/16   Nahser, Wonda Cheng, MD  diphenhydramine-acetaminophen (TYLENOL PM) 25-500 MG TABS tablet Take 2 tablets by mouth at bedtime.    [provider]  ELIQUIS 2.5 MG TABS tablet TAKE 1 TABLET BY MOUTH TWICE A DAY 07/26/16   Nahser, Wonda Cheng, MD  metoprolol tartrate (LOPRESSOR) 25 MG tablet Take 1 tablet (25 mg total) by mouth 2 (two) times daily. 03/08/17 06/06/17  Deboraha Sprang, MD  morphine (MSIR) 15 MG tablet Take 1 tablet (15 mg total) by mouth every 4 (four) hours as needed for severe pain. 03/25/17   Deno Etienne, DO  Multiple Vitamins-Minerals (CENTRUM SILVER ADULT 50+ PO) Take 1 tablet by mouth daily.    [provider]  pantoprazole (PROTONIX) 40 MG tablet TAKE 1 TABLET (40 MG TOTAL) BY MOUTH DAILY. 09/17/14   Schoenhoff, Altamese Cabal, MD  pravastatin (PRAVACHOL) 80 MG tablet TAKE 1 TABLET (80 MG TOTAL) BY MOUTH DAILY. 05/25/15   Nahser, Wonda Cheng, MD    Family History Family History  Problem Relation Age of Onset  . Heart failure Father   . Kidney failure Father   . Bone cancer Brother   . Kidney failure Sister   . Colon cancer Sister   . Cancer Sister     Social History Social History  Substance Use Topics  . Smoking status: Never Smoker  . Smokeless tobacco: Never Used  . Alcohol use No     Allergies   Amiodarone; Aspirin; Lipitor  [atorvastatin calcium]; Nsaids; Tolmetin; Naproxen; Niacin; and Niaspan [niacin er]   Review of Systems Review of Systems  Constitutional: Positive for fatigue. Negative for appetite change, chills and fever.  HENT: Negative for ear pain, rhinorrhea, sneezing and sore throat.   Eyes: Negative for photophobia and visual disturbance.  Respiratory: Negative for cough, chest tightness, shortness of breath and wheezing.   Cardiovascular: Negative for chest pain and palpitations.  Gastrointestinal: Negative for abdominal pain, blood in stool, constipation, diarrhea, nausea and vomiting.  Genitourinary: Positive for urgency. Negative for dysuria and hematuria.  Musculoskeletal: Positive for back pain and gait problem. Negative for myalgias.  Skin: Negative for rash.  Neurological: Positive for weakness. Negative for dizziness, light-headedness and numbness.  Psychiatric/Behavioral: Positive for confusion.     Physical Exam Updated Vital Signs BP (!) 158/83 (BP Location: Right Arm)   Pulse (!) 115  Temp 98.2 F (36.8 C) (Oral)   SpO2 96%   Physical Exam  Constitutional: She appears well-developed and well-nourished. No distress.  Pleasant, nontoxic-appearing and not in acute distress. Able to answer questions and follow commands.  HENT:  Head: Normocephalic and atraumatic.  Nose: Nose normal.  Eyes: Conjunctivae and EOM are normal. Right eye exhibits no discharge. Left eye exhibits no discharge. No scleral icterus.  Neck: Normal range of motion. Neck supple.  Cardiovascular: Regular rhythm, normal heart sounds and intact distal pulses.  Tachycardia present.  Exam reveals no gallop and no friction rub.   No murmur heard. Pulmonary/Chest: Effort normal and breath sounds normal. No respiratory distress.  Abdominal: Soft. Bowel sounds are normal. She exhibits no distension. There is no tenderness. There is no guarding.  Musculoskeletal: Normal range of motion. She exhibits edema.    Tenderness to palpation around the L spine. 1+ pitting edema in bilateral lower extremities. No step-off palpated. No visible bruising or temperature change noted. No objective signs of numbness present. No saddle anesthesia. 2+ DP pulses bilaterally. Sensation intact to light touch.  Neurological: She is alert. No cranial nerve deficit or sensory deficit. She exhibits normal muscle tone. Coordination normal.  Pupils reactive. No facial asymmetry noted. Cranial nerves appear grossly intact. Sensation intact to light touch on face, BUE and BLE. Strength 5/5 in BUE and BLE. Normal patellar reflexes bilaterally.  Skin: Skin is warm and dry. No rash noted.  Psychiatric: She has a normal mood and affect.  Nursing note and vitals reviewed.    ED Treatments / Results  Labs (all labs ordered are listed, but only abnormal results are displayed) Labs Reviewed  COMPREHENSIVE METABOLIC PANEL  CBC WITH DIFFERENTIAL/PLATELET  URINALYSIS, ROUTINE W REFLEX MICROSCOPIC  I-STAT TROPONIN, ED    EKG  EKG Interpretation None       Radiology No results found.  Procedures Procedures (including critical care time)  Medications Ordered in ED Medications - No data to display   Initial Impression / Assessment and Plan / ED Course  I have reviewed the triage vital signs and the nursing notes.  Pertinent labs & imaging results that were available during my care of the patient were reviewed by me and considered in my medical decision making (see chart for details).     Patient presents to ED for evaluation of weakness, increased confusion, hyponatremia, increasing back pain for the past 2 days. She was seen 2 days ago dysuria, diagnosed with UTI and given cipro. States cultures returned as negative, and was told to d/c cipro. Her son and daughter in law state she is more confused. PCP told her that sodium was low at 126. Patient afebrile here with no history of fever. She is tachycardic at 113. She  does have TTP of lumbar spine. She has history lumbar compression fractures 5 days ago, and a history of similar symptoms prior. On physical exam patient is alert and oriented to person, place. No other focal findings on physical exam. She is tachycardic but afebrile with no history of fever.  CT head showed no acute intracranial abnormality. CXR shows no active acute cardiopulmonary disease, but showed osteoporosis with multiple thoracolumbar compression fractures. Urinalysis, CBC unremarkable. Troponin negative x1.  CMP showed sodium at 129. Will plan to complete post void residual and consult care management and social work. Will dispo accordingly. Can plan to optimize pain management with full dose Tylenol four times daily and oxycodone 5-10mg  as needed. Care signed off to [facility]  [place], PA-C pending consult and bladder scan.  Patient discussed with and seen by Dr. Billy Fischer.  Final Clinical Impressions(s) / ED Diagnoses   Final diagnoses:  None    New Prescriptions New Prescriptions   No medications on file     Delia Heady, PA-C 03/27/17 1649    Gareth Morgan, MD 04/01/17 [phone]

## 2017-03-27 NOTE — ED Notes (Signed)
Pt remains in imaging.

## 2017-03-28 ENCOUNTER — Telehealth: Payer: Self-pay | Admitting: Surgery

## 2017-03-28 DIAGNOSIS — R3915 Urgency of urination: Secondary | ICD-10-CM | POA: Diagnosis not present

## 2017-03-28 DIAGNOSIS — N312 Flaccid neuropathic bladder, not elsewhere classified: Secondary | ICD-10-CM | POA: Diagnosis not present

## 2017-03-28 NOTE — Telephone Encounter (Signed)
ED CM received message today from [facility] that will not be able to provide [facility] service to patient . CM made patient and  son aware that this may be a possibility due to insurance. Discussed we may have to send referral to   another [facility] agency he was agreeable. Referral made to [facility], referral accepted

## 2017-03-29 ENCOUNTER — Telehealth: Payer: Self-pay | Admitting: *Deleted

## 2017-03-29 ENCOUNTER — Telehealth: Payer: Self-pay | Admitting: Surgery

## 2017-03-29 NOTE — Telephone Encounter (Signed)
Pt son returned call from Lucia Bitter placed 8/14.  This EDCM relayed message of [facility] not being able to provide services at this time due to staffing constraints.  EDCM will search for home health agencies to provide home health services for his mom.  Pt son states that he can not care for mom in the home in this state without help from home health.   EDCM contacted [facility] to establish services and was asked to fax referral for their consideration.  EDCM will update son as information becomes available.

## 2017-03-29 NOTE — Telephone Encounter (Signed)
ED CM contacted  patient's son concerning [facility] services with referral accepted by [facility]. Provided him with contact information for [facility]. NO further questions or concerns verbalized.

## 2017-03-30 DIAGNOSIS — I129 Hypertensive chronic kidney disease with stage 1 through stage 4 chronic kidney disease, or unspecified chronic kidney disease: Secondary | ICD-10-CM | POA: Diagnosis not present

## 2017-03-30 DIAGNOSIS — I4891 Unspecified atrial fibrillation: Secondary | ICD-10-CM | POA: Diagnosis not present

## 2017-03-30 DIAGNOSIS — I495 Sick sinus syndrome: Secondary | ICD-10-CM | POA: Diagnosis not present

## 2017-03-30 DIAGNOSIS — N183 Chronic kidney disease, stage 3 (moderate): Secondary | ICD-10-CM | POA: Diagnosis not present

## 2017-03-30 DIAGNOSIS — E785 Hyperlipidemia, unspecified: Secondary | ICD-10-CM | POA: Diagnosis not present

## 2017-03-30 DIAGNOSIS — S22080A Wedge compression fracture of T11-T12 vertebra, initial encounter for closed fracture: Secondary | ICD-10-CM | POA: Diagnosis not present

## 2017-03-30 DIAGNOSIS — S22070A Wedge compression fracture of T9-T10 vertebra, initial encounter for closed fracture: Secondary | ICD-10-CM | POA: Diagnosis not present

## 2017-03-30 DIAGNOSIS — I4892 Unspecified atrial flutter: Secondary | ICD-10-CM | POA: Diagnosis not present

## 2017-03-30 DIAGNOSIS — M4690 Unspecified inflammatory spondylopathy, site unspecified: Secondary | ICD-10-CM | POA: Diagnosis not present

## 2017-03-30 DIAGNOSIS — Z7901 Long term (current) use of anticoagulants: Secondary | ICD-10-CM | POA: Diagnosis not present

## 2017-03-30 DIAGNOSIS — I2510 Atherosclerotic heart disease of native coronary artery without angina pectoris: Secondary | ICD-10-CM | POA: Diagnosis not present

## 2017-03-30 DIAGNOSIS — M8088XD Other osteoporosis with current pathological fracture, vertebra(e), subsequent encounter for fracture with routine healing: Secondary | ICD-10-CM | POA: Diagnosis not present

## 2017-03-31 ENCOUNTER — Emergency Department (HOSPITAL_COMMUNITY): Payer: Medicare HMO

## 2017-03-31 ENCOUNTER — Inpatient Hospital Stay (HOSPITAL_COMMUNITY): Payer: Medicare HMO

## 2017-03-31 ENCOUNTER — Encounter (HOSPITAL_COMMUNITY): Payer: Self-pay | Admitting: *Deleted

## 2017-03-31 ENCOUNTER — Inpatient Hospital Stay (HOSPITAL_COMMUNITY)
Admission: EM | Admit: 2017-03-31 | Discharge: 2017-04-02 | DRG: 389 | Disposition: A | Discharge status: Home Health | Payer: Medicare HMO | Attending: Internal Medicine | Admitting: Internal Medicine

## 2017-03-31 DIAGNOSIS — Z950 Presence of cardiac pacemaker: Secondary | ICD-10-CM | POA: Diagnosis not present

## 2017-03-31 DIAGNOSIS — M810 Age-related osteoporosis without current pathological fracture: Secondary | ICD-10-CM | POA: Diagnosis present

## 2017-03-31 DIAGNOSIS — Z951 Presence of aortocoronary bypass graft: Secondary | ICD-10-CM | POA: Diagnosis not present

## 2017-03-31 DIAGNOSIS — Z886 Allergy status to analgesic agent status: Secondary | ICD-10-CM

## 2017-03-31 DIAGNOSIS — K56609 Unspecified intestinal obstruction, unspecified as to partial versus complete obstruction: Secondary | ICD-10-CM | POA: Diagnosis present

## 2017-03-31 DIAGNOSIS — Z79899 Other long term (current) drug therapy: Secondary | ICD-10-CM

## 2017-03-31 DIAGNOSIS — Z90710 Acquired absence of both cervix and uterus: Secondary | ICD-10-CM | POA: Diagnosis not present

## 2017-03-31 DIAGNOSIS — R140 Abdominal distension (gaseous): Secondary | ICD-10-CM

## 2017-03-31 DIAGNOSIS — I2510 Atherosclerotic heart disease of native coronary artery without angina pectoris: Secondary | ICD-10-CM | POA: Diagnosis present

## 2017-03-31 DIAGNOSIS — Z8249 Family history of ischemic heart disease and other diseases of the circulatory system: Secondary | ICD-10-CM

## 2017-03-31 DIAGNOSIS — I10 Essential (primary) hypertension: Secondary | ICD-10-CM | POA: Diagnosis not present

## 2017-03-31 DIAGNOSIS — E785 Hyperlipidemia, unspecified: Secondary | ICD-10-CM | POA: Diagnosis present

## 2017-03-31 DIAGNOSIS — Z4682 Encounter for fitting and adjustment of non-vascular catheter: Secondary | ICD-10-CM | POA: Diagnosis not present

## 2017-03-31 DIAGNOSIS — D649 Anemia, unspecified: Secondary | ICD-10-CM | POA: Diagnosis present

## 2017-03-31 DIAGNOSIS — I482 Chronic atrial fibrillation: Secondary | ICD-10-CM | POA: Diagnosis present

## 2017-03-31 DIAGNOSIS — M4855XA Collapsed vertebra, not elsewhere classified, thoracolumbar region, initial encounter for fracture: Secondary | ICD-10-CM | POA: Diagnosis not present

## 2017-03-31 DIAGNOSIS — N179 Acute kidney failure, unspecified: Secondary | ICD-10-CM | POA: Diagnosis not present

## 2017-03-31 DIAGNOSIS — Z66 Do not resuscitate: Secondary | ICD-10-CM | POA: Diagnosis not present

## 2017-03-31 DIAGNOSIS — M4850XA Collapsed vertebra, not elsewhere classified, site unspecified, initial encounter for fracture: Secondary | ICD-10-CM | POA: Diagnosis present

## 2017-03-31 DIAGNOSIS — Z0189 Encounter for other specified special examinations: Secondary | ICD-10-CM

## 2017-03-31 DIAGNOSIS — M4850XD Collapsed vertebra, not elsewhere classified, site unspecified, subsequent encounter for fracture with routine healing: Secondary | ICD-10-CM | POA: Diagnosis not present

## 2017-03-31 DIAGNOSIS — H9192 Unspecified hearing loss, left ear: Secondary | ICD-10-CM | POA: Diagnosis not present

## 2017-03-31 DIAGNOSIS — R112 Nausea with vomiting, unspecified: Secondary | ICD-10-CM | POA: Diagnosis not present

## 2017-03-31 DIAGNOSIS — Z841 Family history of disorders of kidney and ureter: Secondary | ICD-10-CM | POA: Diagnosis not present

## 2017-03-31 DIAGNOSIS — J90 Pleural effusion, not elsewhere classified: Secondary | ICD-10-CM | POA: Diagnosis not present

## 2017-03-31 DIAGNOSIS — K56600 Partial intestinal obstruction, unspecified as to cause: Secondary | ICD-10-CM | POA: Diagnosis not present

## 2017-03-31 DIAGNOSIS — Z888 Allergy status to other drugs, medicaments and biological substances status: Secondary | ICD-10-CM | POA: Diagnosis not present

## 2017-03-31 DIAGNOSIS — Z7901 Long term (current) use of anticoagulants: Secondary | ICD-10-CM

## 2017-03-31 LAB — CBC WITH DIFFERENTIAL/PLATELET
Basophils Absolute: 0 10*3/uL (ref 0.0–0.1)
Basophils Relative: 0 %
Eosinophils Absolute: 0 10*3/uL (ref 0.0–0.7)
Eosinophils Relative: 0 %
HCT: 37.3 % (ref 36.0–46.0)
Hemoglobin: 12.7 g/dL (ref 12.0–15.0)
Lymphocytes Relative: 6 %
Lymphs Abs: 0.6 10*3/uL — ABNORMAL LOW (ref 0.7–4.0)
MCH: 31.5 pg (ref 26.0–34.0)
MCHC: 34 g/dL (ref 30.0–36.0)
MCV: 92.6 fL (ref 78.0–100.0)
Monocytes Absolute: 0.5 10*3/uL (ref 0.1–1.0)
Monocytes Relative: 5 %
Neutro Abs: 9.1 10*3/uL — ABNORMAL HIGH (ref 1.7–7.7)
Neutrophils Relative %: 89 %
Platelets: 249 10*3/uL (ref 150–400)
RBC: 4.03 MIL/uL (ref 3.87–5.11)
RDW: 12.9 % (ref 11.5–15.5)
WBC: 10.3 10*3/uL (ref 4.0–10.5)

## 2017-03-31 LAB — URINALYSIS, ROUTINE W REFLEX MICROSCOPIC
Bilirubin Urine: NEGATIVE
Glucose, UA: NEGATIVE mg/dL
Ketones, ur: 20 mg/dL — AB
Nitrite: NEGATIVE
Protein, ur: 30 mg/dL — AB
Specific Gravity, Urine: >1.046 — ABNORMAL HIGH (ref 1.005–1.030)
pH: 5 (ref 5.0–8.0)

## 2017-03-31 LAB — BASIC METABOLIC PANEL
Anion gap: 11 (ref 5–15)
BUN: 24 mg/dL — ABNORMAL HIGH (ref 6–20)
CO2: 25 mmol/L (ref 22–32)
Calcium: 9.5 mg/dL (ref 8.9–10.3)
Chloride: 95 mmol/L — ABNORMAL LOW (ref 101–111)
Creatinine, Ser: 1.02 mg/dL — ABNORMAL HIGH (ref 0.44–1.00)
GFR calc Af Amer: 55 mL/min — ABNORMAL LOW (ref >60)
GFR calc non Af Amer: 48 mL/min — ABNORMAL LOW (ref >60)
Glucose, Bld: 124 mg/dL — ABNORMAL HIGH (ref 65–99)
Potassium: 4.2 mmol/L (ref 3.5–5.1)
Sodium: 131 mmol/L — ABNORMAL LOW (ref 135–145)

## 2017-03-31 LAB — MAGNESIUM: MAGNESIUM: 1.8 mg/dL (ref 1.7–2.4)

## 2017-03-31 LAB — APTT: APTT: 33 s (ref 24–36)

## 2017-03-31 MED ORDER — FENTANYL CITRATE (PF) 100 MCG/2ML IJ SOLN
50.0000 ug | Freq: Once | INTRAMUSCULAR | Status: AC
  Administered 2017-03-31: 50 ug via INTRAVENOUS
  Filled 2017-03-31: qty 2

## 2017-03-31 MED ORDER — HEPARIN (PORCINE) IN NACL 100-0.45 UNIT/ML-% IJ SOLN
900.0000 [IU]/h | INTRAMUSCULAR | Status: DC
  Administered 2017-03-31: 900 [IU]/h via INTRAVENOUS
  Filled 2017-03-31: qty 250

## 2017-03-31 MED ORDER — IOPAMIDOL (ISOVUE-300) INJECTION 61%
100.0000 mL | Freq: Once | INTRAVENOUS | Status: AC | PRN
  Administered 2017-03-31: 80 mL via INTRAVENOUS

## 2017-03-31 MED ORDER — HEPARIN SODIUM (PORCINE) 5000 UNIT/ML IJ SOLN: 5000.0000 [IU] | Freq: Three times a day (TID) | INTRAMUSCULAR | Status: DC

## 2017-03-31 MED ORDER — ONDANSETRON HCL 4 MG/2ML IJ SOLN
4.0000 mg | Freq: Four times a day (QID) | INTRAMUSCULAR | Status: DC
  Administered 2017-03-31 – 2017-04-01 (×3): 4 mg via INTRAVENOUS
  Filled 2017-03-31 (×4): qty 2

## 2017-03-31 MED ORDER — ONDANSETRON HCL 4 MG/2ML IJ SOLN
4.0000 mg | Freq: Once | INTRAMUSCULAR | Status: AC
  Administered 2017-03-31: 4 mg via INTRAVENOUS
  Filled 2017-03-31: qty 2

## 2017-03-31 MED ORDER — SODIUM CHLORIDE 0.9 % IJ SOLN
INTRAMUSCULAR | Status: AC
  Filled 2017-03-31: qty 50

## 2017-03-31 MED ORDER — IOPAMIDOL (ISOVUE-300) INJECTION 61%
INTRAVENOUS | Status: AC
  Filled 2017-03-31: qty 100

## 2017-03-31 MED ORDER — SODIUM CHLORIDE 0.9 % IV SOLN
INTRAVENOUS | Status: DC
  Administered 2017-03-31 – 2017-04-01 (×2): via INTRAVENOUS

## 2017-03-31 MED ORDER — METOPROLOL TARTRATE 5 MG/5ML IV SOLN
2.5000 mg | Freq: Four times a day (QID) | INTRAVENOUS | Status: DC
  Administered 2017-04-01 (×2): 2.5 mg via INTRAVENOUS
  Filled 2017-03-31 (×2): qty 5

## 2017-03-31 MED ORDER — DIATRIZOATE MEGLUMINE & SODIUM 66-10 % PO SOLN
90.0000 mL | Freq: Once | ORAL | Status: AC
  Administered 2017-03-31: 90 mL via NASOGASTRIC
  Filled 2017-03-31: qty 90

## 2017-03-31 MED ORDER — MORPHINE SULFATE (PF) 2 MG/ML IV SOLN
1.0000 mg | Freq: Once | INTRAVENOUS | Status: AC
  Administered 2017-03-31: 1 mg via INTRAVENOUS
  Filled 2017-03-31: qty 1

## 2017-03-31 MED ORDER — SODIUM CHLORIDE 0.9 % IV SOLN
INTRAVENOUS | Status: DC
  Administered 2017-03-31: 10:00:00 via INTRAVENOUS

## 2017-03-31 MED ORDER — LIDOCAINE HCL 2 % EX GEL
1.0000 "application " | Freq: Once | CUTANEOUS | Status: AC | PRN
  Administered 2017-03-31: 1
  Filled 2017-03-31: qty 11

## 2017-03-31 MED ORDER — APIXABAN 2.5 MG PO TABS: 2.5000 mg | ORAL_TABLET | Freq: Two times a day (BID) | ORAL | Status: DC

## 2017-03-31 NOTE — H&P (Addendum)
History and Physical    Donna Ortega JSE:[id number] DOB: 1925-11-01 DOA: 03/31/2017  PCP: Lanice Shirts, MD Patient coming from: home  I have personally briefly reviewed patient's old medical records in [place]  Chief Complaint: abdominal pain and distension  HPI: Donna Ortega is a 83 y.o. female with medical history significant of multiple compression fractures history of hysterectomy in the past, no other abdominal surgeries,  no history of bowel obstruction, was being treated with narcotics for compression fracture. She is now admitted with small bowel obstruction. She has been having nausea vomiting and abdominal distention. Her last bowel movement was 8 days ago. She had a Foley catheter placed in the last 2 days to see if her abdominal distention will come down but that has not helped her with her abdominal distention. No fever or chills. No chest pain shortness of breath cough. No headache changes with her vision. However she does notice some edema in both of her legs in the last few days.  ED Course: Patient had a CT scan of the abdomen in the ER which was consistent for small bowel obstruction. There was no evidence of fecal stasis. She was started on IV fluids, NG tube was ordered. And patient was placed nothing by mouth. A surgical consult has been called by the ER doctor. Patient was also treated with Zofran.  Review of Systems: As per HPI otherwise 10 point review of systems negative.  Past Medical History:  Diagnosis Date  . Arthritis    osteo   in back  . Coronary artery disease 2007   3 vessel CABG with LIMA-LAD, SVG to diag, and SVG - Ramus  . Diverticulitis   . GI bleeding 09/09/2013  . Hearing loss of left ear 07/2013  . History of atrial fibrillation; review of all ECG are most consistent with atrial tachycardia    had rash with amiodarone; no anticoagulation due to GI bleed in December 2012  . History of GI bleed Dec 2012  . History of hyperkalemia     . Hyperlipidemia   . Hypertension   . Hyponatremia   . Pacemaker-[employer]tr[employer] 02/09/2012  . Palpitations     Past Surgical History:  Procedure Laterality Date  . ABDOMINAL HYSTERECTOMY    . COLONOSCOPY N/A 09/12/2013   Procedure: COLONOSCOPY;  Surgeon: Wonda Horner, MD;  Location: [facility] ENDOSCOPY;  Service: Endoscopy;  Laterality: N/A;  . CORONARY ARTERY BYPASS GRAFT  Feb 2007   Dr. Cyndia Bent;   . ESOPHAGOGASTRODUODENOSCOPY  08/04/2011   Procedure: ESOPHAGOGASTRODUODENOSCOPY (EGD);  Surgeon: Cleotis Nipper, MD;  Location: [facility] ENDOSCOPY;  Service: Endoscopy;  Laterality: N/A;  do at bedside  . ESOPHAGOGASTRODUODENOSCOPY N/A 09/11/2013   Procedure: ESOPHAGOGASTRODUODENOSCOPY (EGD);  Surgeon: Jeryl Columbia, MD;  Location: [facility] ENDOSCOPY;  Service: Endoscopy;  Laterality: N/A;  . MAZE    . pace maker    . PARTIAL HYSTERECTOMY    . PERMANENT PACEMAKER INSERTION N/A 02/08/2012   Procedure: PERMANENT PACEMAKER INSERTION;  Surgeon: Deboraha Sprang, MD;  Location: [facility] CATH LAB;  Service: Cardiovascular;  Laterality: N/A;     reports that she has never smoked. She has never used smokeless tobacco. She reports that she does not drink alcohol or use drugs.  Allergies  Allergen Reactions  . Amiodarone Hives    ALL OVER BODY HIVES/ ITCH   . Aspirin Other (See Comments)    Gi bleed   . Lipitor [Atorvastatin Calcium] Other (See Comments)    MUSCLE  ACHES  . Nsaids Other (See Comments)    GI bleed  . Tolmetin Other (See Comments)    GI bleed  . Naproxen Other (See Comments)  . Niacin Rash  . Niaspan [Niacin Er] Rash    Family History  Problem Relation Age of Onset  . Heart failure Father   . Kidney failure Father   . Bone cancer Brother   . Kidney failure Sister   . Colon cancer Sister   . Cancer Sister     Prior to Admission medications   Medication Sig Start Date End Date Taking? Authorizing Provider  alendronate (FOSAMAX) 70 MG tablet Take 70 mg by mouth once a week. Take on Thursday  with a full glass of water on an empty stomach.   Yes [provider]  Calcium Carbonate-Vitamin D (CALTRATE 600+D PO) Take 1 tablet by mouth daily.    Yes [provider]  diltiazem (CARDIZEM CD) 240 MG 24 hr capsule TAKE 1 CAPSULE (240 MG TOTAL) BY MOUTH DAILY. 11/25/16  Yes Nahser, Wonda Cheng, MD  diphenhydramine-acetaminophen (TYLENOL PM) 25-500 MG TABS tablet Take 2 tablets by mouth at bedtime.   Yes [provider]  ELIQUIS 2.5 MG TABS tablet TAKE 1 TABLET BY MOUTH TWICE A DAY Patient taking differently: TAKE 2.5MG  BY MOUTH TWICE A DAY 07/26/16  Yes Nahser, Wonda Cheng, MD  metoprolol tartrate (LOPRESSOR) 25 MG tablet Take 1 tablet (25 mg total) by mouth 2 (two) times daily. Patient taking differently: Take 12.5 mg by mouth 2 (two) times daily.  03/08/17 06/06/17 Yes Deboraha Sprang, MD  MOVANTIK 25 MG TABS tablet Take 25 mg by mouth daily as needed (constipation).  03/30/17  Yes [provider]  Multiple Vitamins-Minerals (CENTRUM SILVER ADULT 50+ PO) Take 1 tablet by mouth daily.   Yes [provider]  ondansetron (ZOFRAN) 4 MG tablet Take 4 mg by mouth every 8 (eight) hours as needed for nausea. 03/30/17  Yes [provider]  oxyCODONE (ROXICODONE) 5 MG immediate release tablet Take 1 tablet (5 mg total) by mouth every 4 (four) hours as needed for severe pain. 03/27/17  Yes Carlisle Cater, PA-C  pravastatin (PRAVACHOL) 80 MG tablet TAKE 1 TABLET (80 MG TOTAL) BY MOUTH DAILY. 05/25/15  Yes Nahser, Wonda Cheng, MD  morphine (MSIR) 15 MG tablet Take 1 tablet (15 mg total) by mouth every 4 (four) hours as needed for severe pain. Patient not taking: Reported on 03/31/2017 03/25/17   Deno Etienne, DO  pantoprazole (PROTONIX) 40 MG tablet TAKE 1 TABLET (40 MG TOTAL) BY MOUTH DAILY. Patient not taking: Reported on 03/27/2017 09/17/14   Lanice Shirts, MD    Physical Exam: Vitals:   03/31/17 0858 03/31/17 0859 03/31/17 1108  BP: 128/83  (!) 148/77   Pulse: 96  89  Resp: 17  16  Temp: 98.4 F (36.9 C)  98.3 F (36.8 C)  TempSrc: Oral  Oral  SpO2: 94%  93%  Weight:  63.5 kg (140 lb)   Height:  5\' 4"  (1.626 m)     Constitutional: NAD, calm, comfortable Vitals:   03/31/17 0858 03/31/17 0859 03/31/17 1108  BP: 128/83  (!) 148/77  Pulse: 96  89  Resp: 17  16  Temp: 98.4 F (36.9 C)  98.3 F (36.8 C)  TempSrc: Oral  Oral  SpO2: 94%  93%  Weight:  63.5 kg (140 lb)   Height:  5\' 4"  (1.626 m)    Eyes: PERRL, lids and conjunctivae  normal ENMT: Mucous membranes are moist. Posterior pharynx clear of any exudate or lesions.Normal dentition.  Neck: normal, supple, no masses, no thyromegaly Respiratory: clear to auscultation bilaterally, no wheezing, no crackles. Normal respiratory effort. No accessory muscle use.  Cardiovascular: Regular rate and rhythm, no murmurs / rubs / gallops. No extremity edema. 2+ pedal pulses. No carotid bruits.  Abdomen: distended.generalized tenderness.hyperactive bowel sounds. Musculoskeletal: no clubbing / cyanosis. No joint deformity upper and lower extremities. Good ROM, no contractures. Normal muscle tone.  Skin: no rashes, lesions, ulcers. No induration Neurologic: CN 2-12 grossly intact. Sensation intact, DTR normal. Strength 5/5 in all 4.  Psychiatric: Normal judgment and insight. Alert and oriented x 3. Normal mood.     Labs on Admission: I have personally reviewed following labs and imaging studies  CBC:  Recent Labs Lab 03/25/17 0952 03/25/17 1144 03/27/17 1517 03/31/17 1004  WBC  --  7.0 8.1 10.3  NEUTROABS  --  5.9 5.9 9.1*  HGB 12.9 12.5 12.1 12.7  HCT 38.0 37.3 35.4* 37.3  MCV  --  92.1 91.2 92.6  PLT  --  176 199 578   Basic Metabolic Panel:  Recent Labs Lab 03/25/17 0952 03/27/17 1517 03/31/17 1004  NA 130* 129* 131*  K 4.2 4.0 4.2  CL 93* 94* 95*  CO2  --  24 25  GLUCOSE 101* 96 124*  BUN 22* 15 24*  CREATININE 0.90 1.01* 1.02*  CALCIUM  --  8.6* 9.5    GFR: Estimated Creatinine Clearance: 32.9 mL/min (A) (by C-G formula based on SCr of 1.02 mg/dL (H)). Liver Function Tests:  Recent Labs Lab 03/27/17 1517  AST 24  ALT 12*  ALKPHOS 53  BILITOT 0.6  PROT 6.7  ALBUMIN 4.0   No results for input(s): LIPASE, AMYLASE in the last 168 hours. No results for input(s): AMMONIA in the last 168 hours. Coagulation Profile: No results for input(s): INR, PROTIME in the last 168 hours. Cardiac Enzymes: No results for input(s): CKTOTAL, CKMB, CKMBINDEX, TROPONINI in the last 168 hours. BNP (last 3 results) No results for input(s): PROBNP in the last 8760 hours. HbA1C: No results for input(s): HGBA1C in the last 72 hours. CBG: No results for input(s): GLUCAP in the last 168 hours. Lipid Profile: No results for input(s): CHOL, HDL, LDLCALC, TRIG, CHOLHDL, LDLDIRECT in the last 72 hours. Thyroid Function Tests: No results for input(s): TSH, T4TOTAL, FREET4, T3FREE, THYROIDAB in the last 72 hours. Anemia Panel: No results for input(s): VITAMINB12, FOLATE, FERRITIN, TIBC, IRON, RETICCTPCT in the last 72 hours. Urine analysis:    Component Value Date/Time   COLORURINE YELLOW 03/31/2017 1209   APPEARANCEUR HAZY (A) 03/31/2017 1209   LABSPEC >1.046 (H) 03/31/2017 1209   PHURINE 5.0 03/31/2017 1209   GLUCOSEU NEGATIVE 03/31/2017 1209   HGBUR LARGE (A) 03/31/2017 1209   BILIRUBINUR NEGATIVE 03/31/2017 1209   BILIRUBINUR neg 09/24/2014 0907   KETONESUR 20 (A) 03/31/2017 1209   PROTEINUR 30 (A) 03/31/2017 1209   UROBILINOGEN negative 09/24/2014 0907   UROBILINOGEN 0.2 08/20/2011 2000   NITRITE NEGATIVE 03/31/2017 1209   LEUKOCYTESUR TRACE (A) 03/31/2017 1209    Radiological Exams on Admission: Ct Abdomen Pelvis W Contrast  Result Date: 03/31/2017 CLINICAL DATA:  Abdominal distention. EXAM: CT ABDOMEN AND PELVIS WITH CONTRAST TECHNIQUE: Multidetector CT imaging of the abdomen and pelvis was performed using the standard protocol following  bolus administration of intravenous contrast. CONTRAST:  61mL ISOVUE-300 IOPAMIDOL (ISOVUE-300) INJECTION 61% COMPARISON:  03/25/2017 FINDINGS: Lower chest:  Small bilateral pleural effusions identified. Aortic atherosclerosis. Calcification in the RCA coronary artery noted. Hepatobiliary: No focal liver abnormality is seen. No gallstones, gallbladder wall thickening, or biliary dilatation. Pancreas: Unremarkable. No pancreatic ductal dilatation or surrounding inflammatory changes. Spleen: Normal in size without focal abnormality. Adrenals/Urinary Tract: The adrenal glands appear normal. Normal appearance of the kidneys. The urinary bladder is partially collapsed around a Foley catheter balloon. Stomach/Bowel: Moderate distension of the stomach. Small bowel loops are dilated and there are small bowel air-fluid levels. The bowel loops measure up to 3.5 cm in diameter. Transition to decreased caliber small bowel loops identified within the left lower quadrant of the abdomen, image 57 of series 2. The ileum appears decreased in caliber. Numerous colonic diverticula noted without evidence for acute diverticulitis. No pathologic dilatation of the colon. Vascular/Lymphatic: Aortic atherosclerosis. No aneurysm. No enlarged upper abdominal or pelvic lymph nodes. No inguinal adenopathy. Reproductive: Status post hysterectomy. No adnexal masses. Other: No free fluid or fluid collections. Musculoskeletal: Multi level compression fractures are identified throughout the thoracic and lumbar spine including: T10, T12, L1, L3 and L4. IMPRESSION: 1. Examination is positive for small bowel obstruction. Transition point is identified within the left lower quadrant of the abdomen. 2.  Aortic Atherosclerosis (ICD10-I70.0). 3. Multiple thoracic and lumbar compression deformities as detailed on 03/25/2017 CT. Electronically Signed   By: Kerby Moors M.D.   On: 03/31/2017 11:47    EKG: Independently reviewed.    Assessment/Plan Active Problems:   Small bowel obstruction ([place])  Patient will be placed nothing by mouth. IV fluids will be given at 100 mL an hour. KUB is ordered for tomorrow to follow-up on the small bowel obstruction. Will also obtain a urine culture to rule out urinary tract infection. I will not resume her narcotics since I feel this has can't attribute it to her mechanical small bowel obstruction. Follow-up electrolytes level calcium magnesium potassium tomorrow and replace as needed. NG tube to be placed in the ER. Surgical consult is pending at this time. Will order Zofran IV for nausea. I am not ordering any laxatives also stool softeners at this time as there is no evidence of stool by a CT scan of her abdomen. Follow-up KUB tomorrow.  Multiple compression fractures of the spine follow-up with surgery after discharge. Treat with Tylenol as needed.  Hypertension patient takes Cardizem and metoprolol at home patient is nothing by mouth at this time will give her metoprolol IV as needed.  Chronic atrial fibrillation patient takes Eliquis 2.5 mg twice a day at home.since she is NPO she will be on iv heparin which will need to be switched to Eliquis as soon as she can tolerate po.Pharmacy following for heparin protocol.  Chronic hyponatremia sodium 131 at this time continue to monitor.  Osteoporosis patient takes Fosamax at home which should be on hold at this time resume upon discharge.      DVT prophylaxis: heparin Code Status: dnr Family Communication:  Disposition Plan: home Consults called: surgery Admission status: in patient   Georgette Shell MD [facility]   If 7PM-7AM, please contact night-coverage [email or web address] Password [email or web address]  03/31/2017, 1:49 PM

## 2017-03-31 NOTE — ED Notes (Signed)
MADE AN UNSUCCESSFUL ATTEMPT TO COLLECT BLOOD SAMPLES

## 2017-03-31 NOTE — ED Provider Notes (Signed)
[facility] DEPT Provider Note   CSN: [id number] Arrival date & time: 03/31/17  0848   By signing my name below, I, Donna Ortega, attest that this documentation has been prepared under the direction and in the presence of Virgel Manifold, MD. Electronically signed, Donna Ortega, ED Scribe. 03/31/17. 9:43 AM.  History   Chief Complaint Chief Complaint  Patient presents with  . Altered Mental Status  . Constipation   The history is provided by the patient and medical records. No language interpreter was used.    Donna Ortega is a 83 y.o. female with h/o diverticulitis, HLD, GI bleeding, hemmorrhoid and HTN presenting to the Emergency Department concerning constipation x 8 days. Abdominal pain and distention, and confusion x ~3 days associated. Pt described 10/10, constant pressure to her abdomen that is worse with palpation in triage. Baseline back pain noted d/t compressed spine fracture. H/o hysterectomy reported; no other abdominal surgeries. No h/o bowel obstructions. No other complaints at this time.     Past Medical History:  Diagnosis Date  . Arthritis    osteo   in back  . Coronary artery disease 2007   3 vessel CABG with LIMA-LAD, SVG to diag, and SVG - Ramus  . Diverticulitis   . GI bleeding 09/09/2013  . Hearing loss of left ear 07/2013  . History of atrial fibrillation; review of all ECG are most consistent with atrial tachycardia    had rash with amiodarone; no anticoagulation due to GI bleed in December 2012  . History of GI bleed Dec 2012  . History of hyperkalemia   . Hyperlipidemia   . Hypertension   . Hyponatremia   . Pacemaker-Medtronic 02/09/2012  . Palpitations     Patient Active Problem List   Diagnosis Date Noted  . Edema 04/09/2014  . Vertebral compression fracture ([place]) 10/29/2013  . Compressed spine fracture ([place]) 10/29/2013  . Gastric erosions 10/09/2013  . Decreased hearing of both ears 10/09/2013  . Anemia 06/26/2013  . Osteoporosis   Fosamax restarted 10/2013 06/18/2013  . Osteopenia 05/05/2013  . Palpitation 02/19/2013  . Insomnia 02/18/2013  . Candida esophagitis ([place]) 01/22/2013  . Gastric ulcer 01/22/2013  . UTI (urinary tract infection) 01/17/2013  . HLD (hyperlipidemia) 06/19/2012  . Atrial tachycardia ([place]) 05/22/2012  . Pacemaker-Medtronic 02/09/2012  . Sinus node dysfunction ([place]) 02/06/2012  . Syncope and collapse 02/06/2012  . Benign hypertension 09/27/2011  . Rash 09/19/2011  . UTI (lower urinary tract infection) 08/21/2011  . Fatigue 08/21/2011  . Peptic ulcer disease with hemorrhage 08/14/2011  . S/P vertebroplasty 08/14/2011  . H/O Spinal surgery 08/14/2011  . Chronic kidney disease (CKD), stage III (moderate) 08/05/2011  . Anemia associated with acute blood loss 08/04/2011  . GI bleed 08/04/2011  . Dyslipidemia 08/04/2011  . Melena 08/03/2011  . CAD (coronary artery disease) s/p CABG 01/11/2011  . Atrial fibrillation/flutter 01/11/2011  . HTN (hypertension) 01/11/2011  . Arteriosclerosis of coronary artery 01/11/2011    Past Surgical History:  Procedure Laterality Date  . ABDOMINAL HYSTERECTOMY    . COLONOSCOPY N/A 09/12/2013   Procedure: COLONOSCOPY;  Surgeon: Wonda Horner, MD;  Location: [facility] ENDOSCOPY;  Service: Endoscopy;  Laterality: N/A;  . CORONARY ARTERY BYPASS GRAFT  Feb 2007   Dr. Cyndia Bent;   . ESOPHAGOGASTRODUODENOSCOPY  08/04/2011   Procedure: ESOPHAGOGASTRODUODENOSCOPY (EGD);  Surgeon: Cleotis Nipper, MD;  Location[facility] ENDOSCOPY;  Service: Endoscopy;  Laterality: N/A;  do at bedside  . ESOPHAGOGASTRODUODENOSCOPY N/A 09/11/2013   Procedure: ESOPHAGOGASTRODUODENOSCOPY (EGD);  Surgeon: Jeryl Columbia, MD;  Location: [facility] ENDOSCOPY;  Service: Endoscopy;  Laterality: N/A;  . MAZE    . pace maker    . PARTIAL HYSTERECTOMY    . PERMANENT PACEMAKER INSERTION N/A 02/08/2012   Procedure: PERMANENT PACEMAKER INSERTION;  Surgeon: Deboraha Sprang, MD;  Location: [facility] CATH LAB;  Service:  Cardiovascular;  Laterality: N/A;    OB History    No data available       Home Medications    Prior to Admission medications   Medication Sig Start Date End Date Taking? Authorizing Provider  alendronate (FOSAMAX) 70 MG tablet Take 70 mg by mouth once a week. Take on Thursday with a full glass of water on an empty stomach.    [provider]  Calcium Carbonate-Vitamin D (CALTRATE 600+D PO) Take 1 tablet by mouth daily.     [provider]  diltiazem (CARDIZEM CD) 240 MG 24 hr capsule TAKE 1 CAPSULE (240 MG TOTAL) BY MOUTH DAILY. 11/25/16   Nahser, Wonda Cheng, MD  diphenhydramine-acetaminophen (TYLENOL PM) 25-500 MG TABS tablet Take 2 tablets by mouth at bedtime.    [provider]  ELIQUIS 2.5 MG TABS tablet TAKE 1 TABLET BY MOUTH TWICE A DAY Patient taking differently: TAKE 2.5MG  BY MOUTH TWICE A DAY 07/26/16   Nahser, Wonda Cheng, MD  metoprolol tartrate (LOPRESSOR) 25 MG tablet Take 1 tablet (25 mg total) by mouth 2 (two) times daily. 03/08/17 06/06/17  Deboraha Sprang, MD  morphine (MSIR) 15 MG tablet Take 1 tablet (15 mg total) by mouth every 4 (four) hours as needed for severe pain. 03/25/17   Deno Etienne, DO  Multiple Vitamins-Minerals (CENTRUM SILVER ADULT 50+ PO) Take 1 tablet by mouth daily.    [provider]  oxyCODONE (ROXICODONE) 5 MG immediate release tablet Take 1 tablet (5 mg total) by mouth every 4 (four) hours as needed for severe pain. 03/27/17   Carlisle Cater, PA-C  pantoprazole (PROTONIX) 40 MG tablet TAKE 1 TABLET (40 MG TOTAL) BY MOUTH DAILY. Patient not taking: Reported on 03/27/2017 09/17/14   Lanice Shirts, MD  pravastatin (PRAVACHOL) 80 MG tablet TAKE 1 TABLET (80 MG TOTAL) BY MOUTH DAILY. 05/25/15   Nahser, Wonda Cheng, MD    Family History Family History  Problem Relation Age of Onset  . Heart failure Father   . Kidney failure Father   . Bone cancer Brother   . Kidney failure Sister   . Colon cancer Sister   . Cancer  Sister     Social History Social History  Substance Use Topics  . Smoking status: Never Smoker  . Smokeless tobacco: Never Used  . Alcohol use No     Allergies   Amiodarone; Aspirin; Lipitor [atorvastatin calcium]; Nsaids; Tolmetin; Naproxen; Niacin; and Niaspan [niacin er]   Review of Systems Review of Systems  Gastrointestinal: Positive for abdominal distention, abdominal pain and constipation.  Psychiatric/Behavioral: Positive for confusion.  All other systems reviewed and are negative.    Physical Exam Updated Vital Signs BP 128/83 (BP Location: Left Arm)   Pulse 96   Temp 98.4 F (36.9 C) (Oral)   Resp 17   Ht 5\' 4"  (1.626 m)   Wt 140 lb (63.5 kg)   SpO2 94%   BMI 24.03 kg/m   Physical Exam  Constitutional: She is oriented to person, place, and time. She appears well-developed and well-nourished. No distress.  HENT:  Head: Normocephalic and atraumatic.  Eyes: EOM are normal.  Neck: Normal range of motion.  Cardiovascular: Normal rate, regular rhythm and normal heart sounds.   Pulmonary/Chest: Effort normal and breath sounds normal.  Abdominal: Soft. She exhibits distension. There is generalized tenderness.  Tympany  Genitourinary:  Genitourinary Comments: Chaperone present: No stool in rectal vault.  Musculoskeletal: Normal range of motion.  Neurological: She is alert and oriented to person, place, and time.  Skin: Skin is warm and dry.  Psychiatric: She has a normal mood and affect. Judgment normal.  Nursing note and vitals reviewed.    ED Treatments / Results  DIAGNOSTIC STUDIES: Oxygen Saturation is 94% on RA, adequate by my interpretation.    COORDINATION OF CARE: 9:40 AM-Discussed next steps with pt. Pt verbalized understanding and is agreeable with the plan. Will order imaging, fluid and medications   Labs (all labs ordered are listed, but only abnormal results are displayed) Labs Reviewed  CBC WITH DIFFERENTIAL/PLATELET - Abnormal;  Notable for the following:       Result Value   Neutro Abs 9.1 (*)    Lymphs Abs 0.6 (*)    All other components within normal limits  BASIC METABOLIC PANEL - Abnormal; Notable for the following:    Sodium 131 (*)    Chloride 95 (*)    Glucose, Bld 124 (*)    BUN 24 (*)    Creatinine, Ser 1.02 (*)    GFR calc non Af Amer 48 (*)    GFR calc Af Amer 55 (*)    All other components within normal limits  URINALYSIS, ROUTINE W REFLEX MICROSCOPIC - Abnormal; Notable for the following:    APPearance HAZY (*)    Specific Gravity, Urine >1.046 (*)    Hgb urine dipstick LARGE (*)    Ketones, ur 20 (*)    Protein, ur 30 (*)    Leukocytes, UA TRACE (*)    Bacteria, UA RARE (*)    Squamous Epithelial / LPF 0-5 (*)    All other components within normal limits    EKG  EKG Interpretation None       Radiology Ct Abdomen Pelvis W Contrast  Result Date: 03/31/2017 CLINICAL DATA:  Abdominal distention. EXAM: CT ABDOMEN AND PELVIS WITH CONTRAST TECHNIQUE: Multidetector CT imaging of the abdomen and pelvis was performed using the standard protocol following bolus administration of intravenous contrast. CONTRAST:  10mL ISOVUE-300 IOPAMIDOL (ISOVUE-300) INJECTION 61% COMPARISON:  03/25/2017 FINDINGS: Lower chest: Small bilateral pleural effusions identified. Aortic atherosclerosis. Calcification in the RCA coronary artery noted. Hepatobiliary: No focal liver abnormality is seen. No gallstones, gallbladder wall thickening, or biliary dilatation. Pancreas: Unremarkable. No pancreatic ductal dilatation or surrounding inflammatory changes. Spleen: Normal in size without focal abnormality. Adrenals/Urinary Tract: The adrenal glands appear normal. Normal appearance of the kidneys. The urinary bladder is partially collapsed around a Foley catheter balloon. Stomach/Bowel: Moderate distension of the stomach. Small bowel loops are dilated and there are small bowel air-fluid levels. The bowel loops measure up to 3.5  cm in diameter. Transition to decreased caliber small bowel loops identified within the left lower quadrant of the abdomen, image 57 of series 2. The ileum appears decreased in caliber. Numerous colonic diverticula noted without evidence for acute diverticulitis. No pathologic dilatation of the colon. Vascular/Lymphatic: Aortic atherosclerosis. No aneurysm. No enlarged upper abdominal or pelvic lymph nodes. No inguinal adenopathy. Reproductive: Status post hysterectomy. No adnexal masses. Other: No free fluid or fluid collections. Musculoskeletal: Multi level compression fractures are identified throughout the thoracic and lumbar spine including: T10,  T12, L1, L3 and L4. IMPRESSION: 1. Examination is positive for small bowel obstruction. Transition point is identified within the left lower quadrant of the abdomen. 2.  Aortic Atherosclerosis (ICD10-I70.0). 3. Multiple thoracic and lumbar compression deformities as detailed on 03/25/2017 CT. Electronically Signed   By: Kerby Moors M.D.   On: 03/31/2017 11:47    Procedures Procedures (including critical care time)  Medications Ordered in ED Medications - No data to display   Initial Impression / Assessment and Plan / ED Course  I have reviewed the triage vital signs and the nursing notes.  Pertinent labs & imaging results that were available during my care of the patient were reviewed by me and considered in my medical decision making (see chart for details).  89yF with abdominal pain, distension and n/v. CT with SBO. NGT. Surgical consultation. Medicine admit.    Final Clinical Impressions(s) / ED Diagnoses   Final diagnoses:  SBO (small bowel obstruction) (HCC)    New Prescriptions New Prescriptions   No medications on file    I personally preformed the services scribed in my presence. The recorded information has been reviewed is accurate. Virgel Manifold, MD.    Virgel Manifold, MD 03/31/17 [phone]

## 2017-03-31 NOTE — ED Notes (Signed)
Called floor to give report but RN unavailable at this time. Awaiting a call back.

## 2017-03-31 NOTE — ED Triage Notes (Signed)
Patient is alert with confusion.  Patient family and care giver bring patient to be seen for abdominal distention with pain and confusion.  Patient care giver states that the patient has not had a BM in 8 days and has abdominal distention.  Patient also has had an increase in confusion over the last few days.  Currently the patient rates her pain 10 of 10 .

## 2017-03-31 NOTE — Consult Note (Signed)
Reason for Consult: small bowel obstruction  Referring Physician: Afrika Brick is an 83 y.o. female.  HPI: patient is an 83 year old female who was brought to the emergency department due to several days of persistent abdominal pain and nausea. Patient is unaccompanied at the time of my evaluation and she is a somewhat vague historian. However her chief complaint is mid abdominal pain which she says has been present for at least 4 days. She states she has not had a bowel movement during that time. He tried an enema with no results. A Foley catheter was placed by her caregiver at home with no improvement. She says she has been nauseated and has been heaving but not vomiting any large amount. Denies any history of similar problems. She is currently being treated for spinal compression fractures causing a lot of pain and is on pain medication. Denies fever or chills. Her only previous abdominal operation was a remote hysterectomy. She states the pain is fairly severe and nothing seems to help.  She is chronically anticoagulated due to atrial fibrillation.  Past Medical History:  Diagnosis Date  . Arthritis    osteo   in back  . Coronary artery disease 2007   3 vessel CABG with LIMA-LAD, SVG to diag, and SVG - Ramus  . Diverticulitis   . GI bleeding 09/09/2013  . Hearing loss of left ear 07/2013  . History of atrial fibrillation; review of all ECG are most consistent with atrial tachycardia    had rash with amiodarone; no anticoagulation due to GI bleed in December 2012  . History of GI bleed Dec 2012  . History of hyperkalemia   . Hyperlipidemia   . Hypertension   . Hyponatremia   . Pacemaker-Medtronic 02/09/2012  . Palpitations     Past Surgical History:  Procedure Laterality Date  . ABDOMINAL HYSTERECTOMY    . COLONOSCOPY N/A 09/12/2013   Procedure: COLONOSCOPY;  Surgeon: Wonda Horner, MD;  Location: [facility] ENDOSCOPY;  Service: Endoscopy;  Laterality: N/A;  . CORONARY ARTERY  BYPASS GRAFT  Feb 2007   Dr. Cyndia Bent;   . ESOPHAGOGASTRODUODENOSCOPY  08/04/2011   Procedure: ESOPHAGOGASTRODUODENOSCOPY (EGD);  Surgeon: Cleotis Nipper, MD;  Location: [facility] ENDOSCOPY;  Service: Endoscopy;  Laterality: N/A;  do at bedside  . ESOPHAGOGASTRODUODENOSCOPY N/A 09/11/2013   Procedure: ESOPHAGOGASTRODUODENOSCOPY (EGD);  Surgeon: Jeryl Columbia, MD;  Location: [facility] ENDOSCOPY;  Service: Endoscopy;  Laterality: N/A;  . MAZE    . pace maker    . PARTIAL HYSTERECTOMY    . PERMANENT PACEMAKER INSERTION N/A 02/08/2012   Procedure: PERMANENT PACEMAKER INSERTION;  Surgeon: Deboraha Sprang, MD;  Location: [facility] CATH LAB;  Service: Cardiovascular;  Laterality: N/A;    Family History  Problem Relation Age of Onset  . Heart failure Father   . Kidney failure Father   . Bone cancer Brother   . Kidney failure Sister   . Colon cancer Sister   . Cancer Sister     Social History:  reports that she has never smoked. She has never used smokeless tobacco. She reports that she does not drink alcohol or use drugs.  Allergies:  Allergies  Allergen Reactions  . Amiodarone Hives    ALL OVER BODY HIVES/ ITCH   . Aspirin Other (See Comments)    Gi bleed   . Lipitor [Atorvastatin Calcium] Other (See Comments)    MUSCLE ACHES  . Nsaids Other (See Comments)    GI bleed  . Tolmetin Other (  See Comments)    GI bleed  . Naproxen Other (See Comments)  . Niacin Rash  . Niaspan [Niacin Er] Rash    Current Facility-Administered Medications  Medication Dose Route Frequency Provider Last Rate Last Dose  . 0.9 %  sodium chloride infusion   Intravenous Continuous Virgel Manifold, MD   Stopped at 03/31/17 1321  . 0.9 %  sodium chloride infusion   Intravenous Continuous Georgette Shell, MD 100 mL/hr at 03/31/17 1321    . diatrizoate meglumine-sodium (GASTROGRAFIN) 66-10 % solution 90 mL  90 mL Per NG tube Once Excell Seltzer, MD      . metoprolol tartrate (LOPRESSOR) injection 2.5 mg  2.5 mg Intravenous  Q6H Georgette Shell, MD      . ondansetron [facility]) injection 4 mg  4 mg Intravenous Q6H Georgette Shell, MD       Current Outpatient Prescriptions  Medication Sig Dispense Refill  . alendronate (FOSAMAX) 70 MG tablet Take 70 mg by mouth once a week. Take on Thursday with a full glass of water on an empty stomach.    . Calcium Carbonate-Vitamin D (CALTRATE 600+D PO) Take 1 tablet by mouth daily.     Marland Kitchen diltiazem (CARDIZEM CD) 240 MG 24 hr capsule TAKE 1 CAPSULE (240 MG TOTAL) BY MOUTH DAILY. 30 capsule 5  . diphenhydramine-acetaminophen (TYLENOL PM) 25-500 MG TABS tablet Take 2 tablets by mouth at bedtime.    Marland Kitchen ELIQUIS 2.5 MG TABS tablet TAKE 1 TABLET BY MOUTH TWICE A DAY (Patient taking differently: TAKE 2.5MG BY MOUTH TWICE A DAY) 180 tablet 2  . metoprolol tartrate (LOPRESSOR) 25 MG tablet Take 1 tablet (25 mg total) by mouth 2 (two) times daily. (Patient taking differently: Take 12.5 mg by mouth 2 (two) times daily. ) 180 tablet 3  . MOVANTIK 25 MG TABS tablet Take 25 mg by mouth daily as needed (constipation).     . Multiple Vitamins-Minerals (CENTRUM SILVER ADULT 50+ PO) Take 1 tablet by mouth daily.    . ondansetron (ZOFRAN) 4 MG tablet Take 4 mg by mouth every 8 (eight) hours as needed for nausea.    Marland Kitchen oxyCODONE (ROXICODONE) 5 MG immediate release tablet Take 1 tablet (5 mg total) by mouth every 4 (four) hours as needed for severe pain. 15 tablet 0  . pravastatin (PRAVACHOL) 80 MG tablet TAKE 1 TABLET (80 MG TOTAL) BY MOUTH DAILY. 90 tablet 0  . morphine (MSIR) 15 MG tablet Take 1 tablet (15 mg total) by mouth every 4 (four) hours as needed for severe pain. (Patient not taking: Reported on 03/31/2017) 7 tablet 0  . pantoprazole (PROTONIX) 40 MG tablet TAKE 1 TABLET (40 MG TOTAL) BY MOUTH DAILY. (Patient not taking: Reported on 03/27/2017) 90 tablet 1     Results for orders placed or performed during the hospital encounter of 03/31/17 (from the past 48 hour(s))  CBC with  Differential     Status: Abnormal   Collection Time: 03/31/17 10:04 AM  Result Value Ref Range   WBC 10.3 4.0 - 10.5 K/uL   RBC 4.03 3.87 - 5.11 MIL/uL   Hemoglobin 12.7 12.0 - 15.0 g/dL   HCT 37.3 36.0 - 46.0 %   MCV 92.6 78.0 - 100.0 fL   MCH 31.5 26.0 - 34.0 pg   MCHC 34.0 30.0 - 36.0 g/dL   RDW 12.9 11.5 - 15.5 %   Platelets 249 150 - 400 K/uL   Neutrophils Relative % 89 %   Neutro  Abs 9.1 (H) 1.7 - 7.7 K/uL   Lymphocytes Relative 6 %   Lymphs Abs 0.6 (L) 0.7 - 4.0 K/uL   Monocytes Relative 5 %   Monocytes Absolute 0.5 0.1 - 1.0 K/uL   Eosinophils Relative 0 %   Eosinophils Absolute 0.0 0.0 - 0.7 K/uL   Basophils Relative 0 %   Basophils Absolute 0.0 0.0 - 0.1 K/uL  Basic metabolic panel     Status: Abnormal   Collection Time: 03/31/17 10:04 AM  Result Value Ref Range   Sodium 131 (L) 135 - 145 mmol/L   Potassium 4.2 3.5 - 5.1 mmol/L   Chloride 95 (L) 101 - 111 mmol/L   CO2 25 22 - 32 mmol/L   Glucose, Bld 124 (H) 65 - 99 mg/dL   BUN 24 (H) 6 - 20 mg/dL   Creatinine, Ser 1.02 (H) 0.44 - 1.00 mg/dL   Calcium 9.5 8.9 - 10.3 mg/dL   GFR calc non Af Amer 48 (L) >60 mL/min   GFR calc Af Amer 55 (L) >60 mL/min    Comment: (NOTE) The eGFR has been calculated using the CKD EPI equation. This calculation has not been validated in all clinical situations. eGFR's persistently <60 mL/min signify possible Chronic Kidney Disease.    Anion gap 11 5 - 15  Magnesium     Status: None   Collection Time: 03/31/17 10:04 AM  Result Value Ref Range   Magnesium 1.8 1.7 - 2.4 mg/dL  Urinalysis, Routine w reflex microscopic     Status: Abnormal   Collection Time: 03/31/17 12:09 PM  Result Value Ref Range   Color, Urine YELLOW YELLOW   APPearance HAZY (A) CLEAR   Specific Gravity, Urine >1.046 (H) 1.005 - 1.030   pH 5.0 5.0 - 8.0   Glucose, UA NEGATIVE NEGATIVE mg/dL   Hgb urine dipstick LARGE (A) NEGATIVE   Bilirubin Urine NEGATIVE NEGATIVE   Ketones, ur 20 (A) NEGATIVE mg/dL    Protein, ur 30 (A) NEGATIVE mg/dL   Nitrite NEGATIVE NEGATIVE   Leukocytes, UA TRACE (A) NEGATIVE   RBC / HPF TOO NUMEROUS TO COUNT 0 - 5 RBC/hpf   WBC, UA 6-30 0 - 5 WBC/hpf   Bacteria, UA RARE (A) NONE SEEN   Squamous Epithelial / LPF 0-5 (A) NONE SEEN   Mucous PRESENT    Ca Oxalate Crys, UA PRESENT     Dg Abd 1 View  Result Date: 03/31/2017 CLINICAL DATA:  Abdominal distention, nausea and vomiting. EXAM: ABDOMEN - 1 VIEW COMPARISON:  CT abdomen and pelvis from same day. FINDINGS: Multiple dilated loops of small bowel are seen. Contrast is noted within the bilateral renal collecting systems and the decompressed bladder. Foley catheter in the bladder. IMPRESSION: Small bowel obstruction as seen on CT from same day. Electronically Signed   By: Titus Dubin M.D.   On: 03/31/2017 13:46   Ct Abdomen Pelvis W Contrast  Result Date: 03/31/2017 CLINICAL DATA:  Abdominal distention. EXAM: CT ABDOMEN AND PELVIS WITH CONTRAST TECHNIQUE: Multidetector CT imaging of the abdomen and pelvis was performed using the standard protocol following bolus administration of intravenous contrast. CONTRAST:  88m ISOVUE-300 IOPAMIDOL (ISOVUE-300) INJECTION 61% COMPARISON:  03/25/2017 FINDINGS: Lower chest: Small bilateral pleural effusions identified. Aortic atherosclerosis. Calcification in the RCA coronary artery noted. Hepatobiliary: No focal liver abnormality is seen. No gallstones, gallbladder wall thickening, or biliary dilatation. Pancreas: Unremarkable. No pancreatic ductal dilatation or surrounding inflammatory changes. Spleen: Normal in size without focal abnormality. Adrenals/Urinary Tract:  The adrenal glands appear normal. Normal appearance of the kidneys. The urinary bladder is partially collapsed around a Foley catheter balloon. Stomach/Bowel: Moderate distension of the stomach. Small bowel loops are dilated and there are small bowel air-fluid levels. The bowel loops measure up to 3.5 cm in diameter.  Transition to decreased caliber small bowel loops identified within the left lower quadrant of the abdomen, image 57 of series 2. The ileum appears decreased in caliber. Numerous colonic diverticula noted without evidence for acute diverticulitis. No pathologic dilatation of the colon. Vascular/Lymphatic: Aortic atherosclerosis. No aneurysm. No enlarged upper abdominal or pelvic lymph nodes. No inguinal adenopathy. Reproductive: Status post hysterectomy. No adnexal masses. Other: No free fluid or fluid collections. Musculoskeletal: Multi level compression fractures are identified throughout the thoracic and lumbar spine including: T10, T12, L1, L3 and L4. IMPRESSION: 1. Examination is positive for small bowel obstruction. Transition point is identified within the left lower quadrant of the abdomen. 2.  Aortic Atherosclerosis (ICD10-I70.0). 3. Multiple thoracic and lumbar compression deformities as detailed on 03/25/2017 CT. Electronically Signed   By: Kerby Moors M.D.   On: 03/31/2017 11:47   Dg Chest Portable 1 View  Result Date: 03/31/2017 CLINICAL DATA:  Status post NG tube placement. EXAM: PORTABLE CHEST 1 VIEW COMPARISON:  PA and lateral chest 03/27/2017. FINDINGS: NG tube is in place with the tip in the distal esophagus. The tube should be advanced 11-12 cm for positioning of the side-port in the stomach. There is cardiomegaly without edema. Tiny left effusion is noted. There is some left basilar atelectasis. 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 IMPRESSION: NG tube tip is in the distal esophagus. The tube should be advanced 11-12 cm. Cardiomegaly without edema. Tiny left pleural effusion. Electronically Signed   By: Inge Rise M.D.   On: 03/31/2017 13:47    Review of Systems  Constitutional: Negative for chills and fever.  Respiratory: Negative.   Cardiovascular: Positive for leg swelling. Negative for chest pain and palpitations.  Gastrointestinal: Positive for  abdominal pain, nausea and vomiting. Negative for blood in stool and diarrhea.  Musculoskeletal: Positive for back pain and joint pain.   Blood pressure 140/75, pulse 94, temperature 98.3 F (36.8 C), temperature source Oral, resp. rate 17, height _0  (1.626 m), weight 63.5 kg (140 lb), SpO2 93 %. Physical Exam General: Alert, elderly Caucasian female, in no distress Skin: Warm and dry without rash or infection. HEENT: No palpable masses or thyromegaly. Sclera nonicteric. Pupils equal round and reactive. Oropharynx clear. Lymph nodes: No cervical, supraclavicular, or inguinal nodes palpable. Lungs: Breath sounds clear and equal without increased work of breathing Cardiovascular: irregular rhythm. No JVD, 1+ lower extremity edema.  Abdomen: quite distended and tympanitic. Some high-pitched bowel sounds. Mild diffuse tenderness but no guarding. No masses palpable. No organomegaly. No palpable hernias. Extremities: 1+ lower extremity edema. No chronic venous stasis changes. Neurologic: Alert and cooperative. Specifically oriented only to person but no she is in the hospital. No gross motor deficits.   Assessment/Plan: 83 year old female with a number of comorbidities, chronically anticoagulated for atrial fibrillation and recent severe back pain secondary to compression fractures. She now presents with what appears to be a mechanical bowel obstruction with a transition point in the left lower quadrant and removed hysterectomy her only previous operation. NG has been placed although currently is not draining and will need to be advanced. Anticoagulation held of course as she is nothing by mouth. Have ordered small  bowel protocol. No need for immediate surgical intervention. Will follow closely.  Braeleigh Pyper T 03/31/2017, 2:37 PM

## 2017-03-31 NOTE — ED Notes (Signed)
Family at bedside. 

## 2017-03-31 NOTE — ED Notes (Signed)
Pt. Vomited

## 2017-03-31 NOTE — Progress Notes (Signed)
ANTICOAGULATION CONSULT NOTE - Initial Consult  Pharmacy Consult for heparin Indication: atrial fibrillation  Allergies  Allergen Reactions  . Amiodarone Hives    ALL OVER BODY HIVES/ ITCH   . Aspirin Other (See Comments)    Gi bleed   . Lipitor [Atorvastatin Calcium] Other (See Comments)    MUSCLE ACHES  . Nsaids Other (See Comments)    GI bleed  . Tolmetin Other (See Comments)    GI bleed  . Naproxen Other (See Comments)  . Niacin Rash  . Niaspan [Niacin Er] Rash    Patient Measurements: Height: 5\' 4"  (162.6 cm) Weight: 140 lb (63.5 kg) IBW/kg (Calculated) : 54.7 Heparin Dosing Weight: using total body weight of 63.5 kg  Vital Signs: Temp: 98.3 F (36.8 C) (08/17 1108) Temp Source: Oral (08/17 1108) BP: 140/75 (08/17 1424) Pulse Rate: 94 (08/17 1424)  Labs:  Recent Labs  03/31/17 1004  HGB 12.7  HCT 37.3  PLT 249  CREATININE 1.02*    Estimated Creatinine Clearance: 32.9 mL/min (A) (by C-G formula based on SCr of 1.02 mg/dL (H)).   Medical History: Past Medical History:  Diagnosis Date  . Arthritis    osteo   in back  . Coronary artery disease 2007   3 vessel CABG with LIMA-LAD, SVG to diag, and SVG - Ramus  . Diverticulitis   . GI bleeding 09/09/2013  . Hearing loss of left ear 07/2013  . History of atrial fibrillation; review of all ECG are most consistent with atrial tachycardia    had rash with amiodarone; no anticoagulation due to GI bleed in December 2012  . History of GI bleed Dec 2012  . History of hyperkalemia   . Hyperlipidemia   . Hypertension   . Hyponatremia   . Pacemaker-Medtronic 02/09/2012  . Palpitations      Assessment: 83 yoF on apixaban PTA for history of atrial fibrillation admitted with SBO. Surgery consulted, patient will be NPO, and will begin heparin infusion while apixaban on hold.  Patient on apixaban 2.5 mg PO BID (of note, patient only meets one criteria of age >78yo for lower dose). Last dose reported at 0730  8/17.  CBC WNL CrCl~37 ml/min Baseline aPTT, HL, PT/INR ordered STAT  Goal of Therapy:  Heparin level 0.3-0.7 units/ml aPTT 66-102 seconds Monitor platelets by anticoagulation protocol: Yes   Plan:  Begin heparin tonight at 900 units/hr. No bolus due to recent apixaban use.  Anticoagulation lab collection significantly delayed but levels have been drawn.  Will not delay heparin infusion pending results.  Will be starting heparin infusion now, ~12 hours from last reported apixaban dose. Check aPTT in 8 hours from start of infusion.  Will monitor aPTT levels to titrate heparin, anticipating that heparin level will be elevated due to recent apixaban use.  Will check heparin levels at least once daily until aPTT and heparin level correlate, indicating that the effects of apixaban have diminished. Daily CBC. Monitor for bleeding.  Hershal Coria 03/31/2017,3:22 PM

## 2017-04-01 ENCOUNTER — Inpatient Hospital Stay (HOSPITAL_COMMUNITY): Payer: Medicare HMO

## 2017-04-01 DIAGNOSIS — M4850XD Collapsed vertebra, not elsewhere classified, site unspecified, subsequent encounter for fracture with routine healing: Secondary | ICD-10-CM

## 2017-04-01 DIAGNOSIS — N179 Acute kidney failure, unspecified: Secondary | ICD-10-CM

## 2017-04-01 DIAGNOSIS — D649 Anemia, unspecified: Secondary | ICD-10-CM

## 2017-04-01 DIAGNOSIS — K56609 Unspecified intestinal obstruction, unspecified as to partial versus complete obstruction: Principal | ICD-10-CM

## 2017-04-01 LAB — COMPREHENSIVE METABOLIC PANEL
ALK PHOS: 52 U/L (ref 38–126)
ALT: 23 U/L (ref 14–54)
AST: 32 U/L (ref 15–41)
Albumin: 3.4 g/dL — ABNORMAL LOW (ref 3.5–5.0)
Anion gap: 10 (ref 5–15)
BILIRUBIN TOTAL: 0.7 mg/dL (ref 0.3–1.2)
BUN: 27 mg/dL — ABNORMAL HIGH (ref 6–20)
CALCIUM: 8.6 mg/dL — AB (ref 8.9–10.3)
CO2: 25 mmol/L (ref 22–32)
CREATININE: 0.95 mg/dL (ref 0.44–1.00)
Chloride: 101 mmol/L (ref 101–111)
GFR, EST NON AFRICAN AMERICAN: 52 mL/min — AB (ref >60)
Glucose, Bld: 89 mg/dL (ref 65–99)
Potassium: 3.6 mmol/L (ref 3.5–5.1)
Sodium: 136 mmol/L (ref 135–145)
Total Protein: 5.9 g/dL — ABNORMAL LOW (ref 6.5–8.1)

## 2017-04-01 LAB — CBC
HCT: 33.3 % — ABNORMAL LOW (ref 36.0–46.0)
Hemoglobin: 11.3 g/dL — ABNORMAL LOW (ref 12.0–15.0)
MCH: 31.5 pg (ref 26.0–34.0)
MCHC: 33.9 g/dL (ref 30.0–36.0)
MCV: 92.8 fL (ref 78.0–100.0)
PLATELETS: 235 10*3/uL (ref 150–400)
RBC: 3.59 MIL/uL — AB (ref 3.87–5.11)
RDW: 13 % (ref 11.5–15.5)
WBC: 8.2 10*3/uL (ref 4.0–10.5)

## 2017-04-01 LAB — PROTIME-INR
INR: 1.2
PROTHROMBIN TIME: 15.2 s (ref 11.4–15.2)

## 2017-04-01 LAB — HEPARIN LEVEL (UNFRACTIONATED)

## 2017-04-01 LAB — APTT: APTT: 92 s — AB (ref 24–36)

## 2017-04-01 MED ORDER — MORPHINE SULFATE (PF) 2 MG/ML IV SOLN
1.0000 mg | Freq: Once | INTRAVENOUS | Status: AC
  Administered 2017-04-01: 1 mg via INTRAVENOUS
  Filled 2017-04-01: qty 1

## 2017-04-01 MED ORDER — APIXABAN 2.5 MG PO TABS
2.5000 mg | ORAL_TABLET | Freq: Two times a day (BID) | ORAL | Status: DC
  Administered 2017-04-01 – 2017-04-02 (×3): 2.5 mg via ORAL
  Filled 2017-04-01 (×3): qty 1

## 2017-04-01 MED ORDER — MORPHINE SULFATE (PF) 2 MG/ML IV SOLN: 1.0000 mg | INTRAVENOUS | Status: DC | PRN

## 2017-04-01 MED ORDER — ONDANSETRON 4 MG PO TBDP: 8.0000 mg | ORAL_TABLET | Freq: Three times a day (TID) | ORAL | Status: DC | PRN

## 2017-04-01 MED ORDER — PANTOPRAZOLE SODIUM 40 MG PO TBEC
40.0000 mg | DELAYED_RELEASE_TABLET | Freq: Every day | ORAL | Status: DC
  Administered 2017-04-01 – 2017-04-02 (×2): 40 mg via ORAL
  Filled 2017-04-01 (×2): qty 1

## 2017-04-01 MED ORDER — DILTIAZEM HCL ER COATED BEADS 240 MG PO CP24
240.0000 mg | ORAL_CAPSULE | Freq: Every day | ORAL | Status: DC
  Administered 2017-04-01 – 2017-04-02 (×2): 240 mg via ORAL
  Filled 2017-04-01 (×2): qty 1

## 2017-04-01 MED ORDER — MORPHINE SULFATE 15 MG PO TABS: 15.0000 mg | ORAL_TABLET | ORAL | Status: DC | PRN

## 2017-04-01 MED ORDER — PRAVASTATIN SODIUM 20 MG PO TABS
20.0000 mg | ORAL_TABLET | Freq: Every day | ORAL | Status: DC
  Administered 2017-04-01: 20 mg via ORAL
  Filled 2017-04-01: qty 1

## 2017-04-01 MED ORDER — METOPROLOL TARTRATE 25 MG PO TABS
25.0000 mg | ORAL_TABLET | Freq: Two times a day (BID) | ORAL | Status: DC
  Administered 2017-04-01 – 2017-04-02 (×3): 25 mg via ORAL
  Filled 2017-04-01 (×3): qty 1

## 2017-04-01 MED ORDER — OXYCODONE HCL 5 MG PO TABS
5.0000 mg | ORAL_TABLET | ORAL | Status: DC | PRN
  Administered 2017-04-01 – 2017-04-02 (×5): 5 mg via ORAL
  Filled 2017-04-01 (×5): qty 1

## 2017-04-01 NOTE — Progress Notes (Signed)
[facility] PROGRESS NOTE    Progress Note  Donna Ortega  YIF:[id number] DOB: Oct 25, 1925 DOA: 03/31/2017 PCP: Lanice Shirts, MD     Brief Narrative:   Donna Ortega is an 83 y.o. female past medical history of multiple compression fractures, treated with narcotics came into the hospital complaining of nausea vomiting and abdominal pain, her last bowel movement was a few days ago.  Assessment/Plan:   Small bowel obstruction [facility]): Surgery was consulted and was placed nothing by mouth with NG tube intermittent suction. She had a large bowel movement overnight. Surgery recommended to advance diet. Get a physical therapy consult. I will narcotics monitor electrode to repeat  Normocytic anemia At baseline.  Essential hypertension: Resume home medications.  Compressed spine fracture (HCC) These narcotics resume oral regimen. Start MiraLAX daily.   AKI (acute kidney injury) ([place]) Likely prerenal in etiology resolved with IV fluid hydration.    DVT prophylaxis: Eluquis Family Communication:None Disposition Plan/Barrier to D/C:home in am Code Status:     Code Status Orders        Start     Ordered   03/31/17 1303  Do not attempt resuscitation (DNR)  Continuous    Question Answer Comment  In the event of cardiac or respiratory ARREST Do not call a "code blue"   In the event of cardiac or respiratory ARREST Do not perform Intubation, CPR, defibrillation or ACLS   In the event of cardiac or respiratory ARREST Use medication by any route, position, wound care, and other measures to relive pain and suffering. May use oxygen, suction and manual treatment of airway obstruction as needed for comfort.      03/31/17 1303    Code Status History    Date Active Date Inactive Code Status Order ID Comments User Context   09/10/2013  4:48 PM 09/12/2013  6:57 PM Full Code [id number]  Elmarie Shiley, MD Inpatient   08/21/2011  1:46 AM 08/25/2011  5:51 PM DNR [id number]   Alanda Slim, RN Inpatient        IV Access:    Peripheral IV   Procedures and diagnostic studies:   Dg Abd 1 View  Result Date: 03/31/2017 CLINICAL DATA:  Abdominal distention, nausea and vomiting. EXAM: ABDOMEN - 1 VIEW COMPARISON:  CT abdomen and pelvis from same day. FINDINGS: Multiple dilated loops of small bowel are seen. Contrast is noted within the bilateral renal collecting systems and the decompressed bladder. Foley catheter in the bladder. IMPRESSION: Small bowel obstruction as seen on CT from same day. Electronically Signed   By: Titus Dubin M.D.   On: 03/31/2017 13:46   Ct Abdomen Pelvis W Contrast  Result Date: 03/31/2017 CLINICAL DATA:  Abdominal distention. EXAM: CT ABDOMEN AND PELVIS WITH CONTRAST TECHNIQUE: Multidetector CT imaging of the abdomen and pelvis was performed using the standard protocol following bolus administration of intravenous contrast. CONTRAST:  35mL ISOVUE-300 IOPAMIDOL (ISOVUE-300) INJECTION 61% COMPARISON:  03/25/2017 FINDINGS: Lower chest: Small bilateral pleural effusions identified. Aortic atherosclerosis. Calcification in the RCA coronary artery noted. Hepatobiliary: No focal liver abnormality is seen. No gallstones, gallbladder wall thickening, or biliary dilatation. Pancreas: Unremarkable. No pancreatic ductal dilatation or surrounding inflammatory changes. Spleen: Normal in size without focal abnormality. Adrenals/Urinary Tract: The adrenal glands appear normal. Normal appearance of the kidneys. The urinary bladder is partially collapsed around a Foley catheter balloon. Stomach/Bowel: Moderate distension of the stomach. Small bowel loops are dilated and there are small bowel air-fluid levels. The bowel loops  measure up to 3.5 cm in diameter. Transition to decreased caliber small bowel loops identified within the left lower quadrant of the abdomen, image 57 of series 2. The ileum appears decreased in caliber. Numerous colonic diverticula  noted without evidence for acute diverticulitis. No pathologic dilatation of the colon. Vascular/Lymphatic: Aortic atherosclerosis. No aneurysm. No enlarged upper abdominal or pelvic lymph nodes. No inguinal adenopathy. Reproductive: Status post hysterectomy. No adnexal masses. Other: No free fluid or fluid collections. Musculoskeletal: Multi level compression fractures are identified throughout the thoracic and lumbar spine including: T10, T12, L1, L3 and L4. IMPRESSION: 1. Examination is positive for small bowel obstruction. Transition point is identified within the left lower quadrant of the abdomen. 2.  Aortic Atherosclerosis (ICD10-I70.0). 3. Multiple thoracic and lumbar compression deformities as detailed on 03/25/2017 CT. Electronically Signed   By: Kerby Moors M.D.   On: 03/31/2017 11:47   Dg Chest Portable 1 View  Result Date: 03/31/2017 CLINICAL DATA:  Status post NG tube placement. EXAM: PORTABLE CHEST 1 VIEW COMPARISON:  PA and lateral chest 03/27/2017. FINDINGS: NG tube is in place with the tip in the distal esophagus. The tube should be advanced 11-12 cm for positioning of the side-port in the stomach. There is cardiomegaly without edema. Tiny left effusion is noted. There is some left basilar atelectasis. 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 IMPRESSION: NG tube tip is in the distal esophagus. The tube should be advanced 11-12 cm. Cardiomegaly without edema. Tiny left pleural effusion. Electronically Signed   By: Inge Rise M.D.   On: 03/31/2017 13:47   Dg Abd Portable 1v-small Bowel Obstruction Protocol-initial, 8 Hr Delay  Result Date: 04/01/2017 CLINICAL DATA:  Small bowel obstruction 8 hour delay EXAM: PORTABLE ABDOMEN - 1 VIEW COMPARISON:  03/31/2017 FINDINGS: Partially visualized cardiac pacing leads. Esophageal tube tip is in the left upper quadrant. Vertebroplasty changes. Decreased small bowel distention since the prior radiograph. Majority of the  contrast is in the colon and rectum. IMPRESSION: Decreased small bowel distention since the prior radiograph. Majority of the contrast is in the colon and rectum Electronically Signed   By: Donavan Foil M.D.   On: 04/01/2017 02:58   Dg Abd Portable 1v-small Bowel Protocol-position Verification  Result Date: 03/31/2017 CLINICAL DATA:  83 y/o  F; nasogastric tube placement. EXAM: PORTABLE ABDOMEN - 1 VIEW COMPARISON:  03/31/2017 abdomen radiograph. FINDINGS: Persistent dilated small bowel. Contrast is retained within the bladder. Multiple compression deformities with above lumbar kyphoplasty are grossly stable. Median sternotomy wires and 2 pacemaker leads noted. Enteric tube tip projects over the gastric body. IMPRESSION: Enteric tube tip projects over gastric body. Stable dilated small bowel compatible with obstruction. Electronically Signed   By: Kristine Garbe M.D.   On: 03/31/2017 15:59     Medical Consultants:    None.  Anti-Infectives:   None  Subjective:    PAETYN PIETRZAK no new complains. She relates she continues to have mild back pain.  Objective:    Vitals:   03/31/17 1900 03/31/17 1925 03/31/17 2041 04/01/17 0521  BP: (!) 144/72 (!) 144/72 136/79 (!) 148/76  Pulse: (!) 102 76 (!) 121 (!) 105  Resp:  16 18 16   Temp:   98.7 F (37.1 C) 98.9 F (37.2 C)  TempSrc:   Oral Oral  SpO2:  97% 94% 96%  Weight:   65.1 kg (143 lb 8.3 oz)   Height:   5\' 4"  (1.626 m)  Intake/Output Summary (Last 24 hours) at 04/01/17 1020 Last data filed at 04/01/17 0600  Gross per 24 hour  Intake             1200 ml  Output             1650 ml  Net             -450 ml   Filed Weights   03/31/17 0859 03/31/17 2041  Weight: 63.5 kg (140 lb) 65.1 kg (143 lb 8.3 oz)    Exam: General exam: In no acute distress. Respiratory system: Good air movement clear to auscultation. Cardiovascular system: Regular rate and rhythm with positive S1-S2. Gastrointestinal system: Positive  bowel sounds soft nontender nondistended. Central nervous system: Awake alert and oriented 3 nonfocal. Extremities: No pedal edema. Skin: No rashes, lesions or ulcers Psychiatry: Judgement and insight appear normal. Mood & affect appropriate.    Data Reviewed:    Labs: Basic Metabolic Panel:  Recent Labs Lab 03/27/17 1517 03/31/17 1004 04/01/17 0531  NA 129* 131* 136  K 4.0 4.2 3.6  CL 94* 95* 101  CO2 24 25 25   GLUCOSE 96 124* 89  BUN 15 24* 27*  CREATININE 1.01* 1.02* 0.95  CALCIUM 8.6* 9.5 8.6*  MG  --  1.8  --    GFR Estimated Creatinine Clearance: 35.3 mL/min (by C-G formula based on SCr of 0.95 mg/dL). Liver Function Tests:  Recent Labs Lab 03/27/17 1517 04/01/17 0531  AST 24 32  ALT 12* 23  ALKPHOS 53 52  BILITOT 0.6 0.7  PROT 6.7 5.9*  ALBUMIN 4.0 3.4*   No results for input(s): LIPASE, AMYLASE in the last 168 hours. No results for input(s): AMMONIA in the last 168 hours. Coagulation profile  Recent Labs Lab 04/01/17 0531  INR 1.20    CBC:  Recent Labs Lab 03/25/17 1144 03/27/17 1517 03/31/17 1004 04/01/17 0531  WBC 7.0 8.1 10.3 8.2  NEUTROABS 5.9 5.9 9.1*  --   HGB 12.5 12.1 12.7 11.3*  HCT 37.3 35.4* 37.3 33.3*  MCV 92.1 91.2 92.6 92.8  PLT 176 199 249 235   Cardiac Enzymes: No results for input(s): CKTOTAL, CKMB, CKMBINDEX, TROPONINI in the last 168 hours. BNP (last 3 results) No results for input(s): PROBNP in the last 8760 hours. CBG: No results for input(s): GLUCAP in the last 168 hours. D-Dimer: No results for input(s): DDIMER in the last 72 hours. Hgb A1c: No results for input(s): HGBA1C in the last 72 hours. Lipid Profile: No results for input(s): CHOL, HDL, LDLCALC, TRIG, CHOLHDL, LDLDIRECT in the last 72 hours. Thyroid function studies: No results for input(s): TSH, T4TOTAL, T3FREE, THYROIDAB in the last 72 hours.  Invalid input(s): FREET3 Anemia work up: No results for input(s): VITAMINB12, FOLATE, FERRITIN,  TIBC, IRON, RETICCTPCT in the last 72 hours. Sepsis Labs:  Recent Labs Lab 03/25/17 1144 03/27/17 1517 03/31/17 1004 04/01/17 0531  WBC 7.0 8.1 10.3 8.2   Microbiology Recent Results (from the past 240 hour(s))  Urine culture     Status: Abnormal   Collection Time: 03/25/17 11:25 AM  Result Value Ref Range Status   Specimen Description URINE, CLEAN CATCH  Final   Special Requests NONE  Final   Culture <10,000 COLONIES/mL INSIGNIFICANT GROWTH (A)  Final   Report Status 03/26/2017 FINAL  Final     Medications:   . metoprolol tartrate  2.5 mg Intravenous Q6H  . ondansetron (ZOFRAN) IV  4 mg Intravenous Q6H   Continuous  Infusions: . sodium chloride Stopped (03/31/17 1321)  . sodium chloride 100 mL/hr at 04/01/17 0921  . heparin 900 Units/hr (03/31/17 1958)     LOS: 1 day   Charlynne Cousins  [facility] Pager [phone]  *Please refer to [email or web address], password [email or web address] to get updated schedule on who will round on this patient, as hospitalists switch teams weekly. If 7PM-7AM, please contact night-coverage at [email or web address], password [email or web address] for any overnight needs.  04/01/2017, 10:20 AM

## 2017-04-01 NOTE — Evaluation (Signed)
Physical Therapy Evaluation Patient Details Name: Donna Ortega MRN: [id number] DOB: February 27, 1926 Today's Date: 04/01/2017   History of Present Illness  83 yo female admitted with SBO. hx of multiple compression fractures, CABG, A fib, pacemaker, osteoporosis.   Clinical Impression  On eval, pt required Min assist for mobility. She walked ~150 feet with a RW. Mod encouragement for OOB activity. Pt c/o 7/10 back pain 2* compression fractures. No family present during session. Recommend daily ambulation in hallway with nursing to increase activity. Recommend HHPT f/u.     Follow Up Recommendations Home health PT    Equipment Recommendations  Rolling walker with 5" wheels (if pt decides she wants one-she's unsure at time of eval)    Recommendations for Other Services       Precautions / Restrictions Precautions Precautions: Fall; HOH Restrictions Weight Bearing Restrictions: No      Mobility  Bed Mobility Overal bed mobility: Needs Assistance Bed Mobility: Sidelying to Sit;Rolling Rolling: Min guard Sidelying to sit: Min assist Sit to Supine: Min assist       General bed mobility comments: Assist for trunk to upright and LEs back onto bed. Increased time.   Transfers Overall transfer level: Needs assistance Equipment used: Rolling walker (2 wheeled) Transfers: Sit to/from Stand Sit to Stand: Min assist         General transfer comment: Small amont of assist. VCs hand placement. slow to rise.   Ambulation/Gait Ambulation/Gait assistance: Min assist Ambulation Distance (Feet): 150 Feet Assistive device: Rolling walker (2 wheeled) Gait Pattern/deviations: Step-through pattern;Decreased stride length;Trunk flexed     General Gait Details: Assist to maneuver RW. Pt tolerated distance fairly well.   Stairs            Wheelchair Mobility    Modified Rankin (Stroke Patients Only)       Balance                                              Pertinent Vitals/Pain Pain Assessment: 0-10 Pain Score: 7  Pain Location: back-chronic due to compression fractures Pain Descriptors / Indicators: Aching;Sore Pain Intervention(s): Limited activity within patient's tolerance;Repositioned    Home Living Family/patient expects to be discharged to:: Private residence Living Arrangements: Other relatives;Children (family lives upstairs; pt lives in basement) Available Help at Discharge: Family;Available PRN/intermittently Type of Home: House Home Access: Stairs to enter Entrance Stairs-Rails: None Entrance Stairs-Number of Steps: 2 Home Layout: Two level (pt lives in basement but climbs stairs to upper level of home) Home Equipment: Cane - quad      Prior Function Level of Independence: Independent with assistive device(s)         Comments: ambulates with a quad cane     Hand Dominance        Extremity/Trunk Assessment   Upper Extremity Assessment Upper Extremity Assessment: Generalized weakness    Lower Extremity Assessment Lower Extremity Assessment: Generalized weakness    Cervical / Trunk Assessment Cervical / Trunk Assessment: Kyphotic  Communication   Communication: No difficulties  Cognition Arousal/Alertness: Awake/alert Behavior During Therapy: WFL for tasks assessed/performed Overall Cognitive Status: Within Functional Limits for tasks assessed  General Comments      Exercises     Assessment/Plan    PT Assessment Patient needs continued PT services  PT Problem List Decreased mobility;Pain;Decreased activity tolerance;Decreased knowledge of use of DME;Decreased balance       PT Treatment Interventions DME instruction;Therapeutic activities;Therapeutic exercise;Gait training;Patient/family education;Balance training;Functional mobility training    PT Goals (Current goals can be found in the Care Plan section)  Acute Rehab PT  Goals Patient Stated Goal: home. less pain. PT Goal Formulation: With patient Time For Goal Achievement: 04/15/17 Potential to Achieve Goals: Good    Frequency Min 3X/week   Barriers to discharge        Co-evaluation               AM-PAC PT "6 Clicks" Daily Activity  Outcome Measure Difficulty turning over in bed (including adjusting bedclothes, sheets and blankets)?: A Little Difficulty moving from lying on back to sitting on the side of the bed? : Unable Difficulty sitting down on and standing up from a chair with arms (e.g., wheelchair, bedside commode, etc,.)?: Unable Help needed moving to and from a bed to chair (including a wheelchair)?: A Little Help needed walking in hospital room?: A Little Help needed climbing 3-5 steps with a railing? : A Little 6 Click Score: 14    End of Session Equipment Utilized During Treatment: Gait belt Activity Tolerance: Patient tolerated treatment well Patient left: in bed;with call bell/phone within reach;with family/visitor present   PT Visit Diagnosis: Muscle weakness (generalized) (M62.81);Difficulty in walking, not elsewhere classified (R26.2);Pain Pain - part of body:  (back)    Time: 0037-0488 PT Time Calculation (min) (ACUTE ONLY): 13 min   Charges:   PT Evaluation $PT Eval Low Complexity: 1 Low     PT G Codes:        Weston Anna, MPT Pager: [phone]

## 2017-04-01 NOTE — Progress Notes (Signed)
ANTICOAGULATION CONSULT NOTE - Follow Up Consult  Pharmacy Consult for Heparin Indication: atrial fibrillation  Allergies  Allergen Reactions  . Amiodarone Hives    ALL OVER BODY HIVES/ ITCH   . Aspirin Other (See Comments)    Gi bleed   . Lipitor [Atorvastatin Calcium] Other (See Comments)    MUSCLE ACHES  . Nsaids Other (See Comments)    GI bleed  . Tolmetin Other (See Comments)    GI bleed  . Naproxen Other (See Comments)  . Niacin Rash  . Niaspan [Niacin Er] Rash    Patient Measurements: Height: 5\' 4"  (162.6 cm) Weight: 143 lb 8.3 oz (65.1 kg) IBW/kg (Calculated) : 54.7 Heparin Dosing Weight:   Vital Signs: Temp: 98.9 F (37.2 C) (08/18 0521) Temp Source: Oral (08/18 0521) BP: 148/76 (08/18 0521) Pulse Rate: 105 (08/18 0521)  Labs:  Recent Labs  03/31/17 1004 03/31/17 1911 04/01/17 0531  HGB 12.7  --  11.3*  HCT 37.3  --  33.3*  PLT 249  --  235  APTT  --  33 92*  LABPROT  --   --  15.2  INR  --   --  1.20  HEPARINUNFRC  --  >2.00*  --   CREATININE 1.02*  --  0.95    Estimated Creatinine Clearance: 35.3 mL/min (by C-G formula based on SCr of 0.95 mg/dL).   Medications:  Infusions:  . sodium chloride Stopped (03/31/17 1321)  . sodium chloride 100 mL/hr at 03/31/17 1321  . heparin 900 Units/hr (03/31/17 1958)    Assessment: Patient with PTT at goal.  No heparin issues noted.  PTT ordered with Heparin level until both correlate due to possible drug-lab interaction between oral anticoagulant (rivaroxaban, edoxaban, or apixaban) and anti-Xa level (aka heparin level)   Goal of Therapy:  Heparin level 0.3-0.7 units/ml aPTT 66-102 seconds Monitor platelets by anticoagulation protocol: Yes   Plan:  Continue heparin drip at current rate Recheck 1200 PTT  Nani Skillern Crowford 04/01/2017,6:46 AM

## 2017-04-01 NOTE — Progress Notes (Signed)
Subjective/Chief Complaint: Had large BM.  SB protocol showed contrast in colon and rectum.  No n/v.  Pt reports pain and nausea resolved.     Objective: Vital signs in last 24 hours: Temp:  [98.3 F (36.8 C)-98.9 F (37.2 C)] 98.9 F (37.2 C) (08/18 0521) Pulse Rate:  [76-121] 105 (08/18 0521) Resp:  [16-18] 16 (08/18 0521) BP: (136-155)/(66-85) 148/76 (08/18 0521) SpO2:  [93 %-97 %] 96 % (08/18 0521) Weight:  [65.1 kg (143 lb 8.3 oz)] 65.1 kg (143 lb 8.3 oz) (08/17 2041) Last BM Date: 04/01/17  Intake/Output from previous day: 08/17 0701 - 08/18 0700 In: 1200 [I.V.:1200] Out: 1650 [Urine:300; Emesis/NG output:600] Intake/Output this shift: No intake/output data recorded.  General appearance: alert, cooperative and no distress Resp: breathing comfortably GI: soft, non distended, non tender.    Lab Results:   Recent Labs  03/31/17 1004 04/01/17 0531  WBC 10.3 8.2  HGB 12.7 11.3*  HCT 37.3 33.3*  PLT 249 235   BMET  Recent Labs  03/31/17 1004 04/01/17 0531  NA 131* 136  K 4.2 3.6  CL 95* 101  CO2 25 25  GLUCOSE 124* 89  BUN 24* 27*  CREATININE 1.02* 0.95  CALCIUM 9.5 8.6*   PT/INR  Recent Labs  04/01/17 0531  LABPROT 15.2  INR 1.20   ABG No results for input(s): PHART, HCO3 in the last 72 hours.  Invalid input(s): PCO2, PO2  Studies/Results: Dg Abd 1 View  Result Date: 03/31/2017 CLINICAL DATA:  Abdominal distention, nausea and vomiting. EXAM: ABDOMEN - 1 VIEW COMPARISON:  CT abdomen and pelvis from same day. FINDINGS: Multiple dilated loops of small bowel are seen. Contrast is noted within the bilateral renal collecting systems and the decompressed bladder. Foley catheter in the bladder. IMPRESSION: Small bowel obstruction as seen on CT from same day. Electronically Signed   By: Titus Dubin M.D.   On: 03/31/2017 13:46   Ct Abdomen Pelvis W Contrast  Result Date: 03/31/2017 CLINICAL DATA:  Abdominal distention. EXAM: CT ABDOMEN AND  PELVIS WITH CONTRAST TECHNIQUE: Multidetector CT imaging of the abdomen and pelvis was performed using the standard protocol following bolus administration of intravenous contrast. CONTRAST:  39mL ISOVUE-300 IOPAMIDOL (ISOVUE-300) INJECTION 61% COMPARISON:  03/25/2017 FINDINGS: Lower chest: Small bilateral pleural effusions identified. Aortic atherosclerosis. Calcification in the RCA coronary artery noted. Hepatobiliary: No focal liver abnormality is seen. No gallstones, gallbladder wall thickening, or biliary dilatation. Pancreas: Unremarkable. No pancreatic ductal dilatation or surrounding inflammatory changes. Spleen: Normal in size without focal abnormality. Adrenals/Urinary Tract: The adrenal glands appear normal. Normal appearance of the kidneys. The urinary bladder is partially collapsed around a Foley catheter balloon. Stomach/Bowel: Moderate distension of the stomach. Small bowel loops are dilated and there are small bowel air-fluid levels. The bowel loops measure up to 3.5 cm in diameter. Transition to decreased caliber small bowel loops identified within the left lower quadrant of the abdomen, image 57 of series 2. The ileum appears decreased in caliber. Numerous colonic diverticula noted without evidence for acute diverticulitis. No pathologic dilatation of the colon. Vascular/Lymphatic: Aortic atherosclerosis. No aneurysm. No enlarged upper abdominal or pelvic lymph nodes. No inguinal adenopathy. Reproductive: Status post hysterectomy. No adnexal masses. Other: No free fluid or fluid collections. Musculoskeletal: Multi level compression fractures are identified throughout the thoracic and lumbar spine including: T10, T12, L1, L3 and L4. IMPRESSION: 1. Examination is positive for small bowel obstruction. Transition point is identified within the left lower quadrant of the abdomen.  2.  Aortic Atherosclerosis (ICD10-I70.0). 3. Multiple thoracic and lumbar compression deformities as detailed on 03/25/2017  CT. Electronically Signed   By: Kerby Moors M.D.   On: 03/31/2017 11:47   Dg Chest Portable 1 View  Result Date: 03/31/2017 CLINICAL DATA:  Status post NG tube placement. EXAM: PORTABLE CHEST 1 VIEW COMPARISON:  PA and lateral chest 03/27/2017. FINDINGS: NG tube is in place with the tip in the distal esophagus. The tube should be advanced 11-12 cm for positioning of the side-port in the stomach. There is cardiomegaly without edema. Tiny left effusion is noted. There is some left basilar atelectasis. 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 IMPRESSION: NG tube tip is in the distal esophagus. The tube should be advanced 11-12 cm. Cardiomegaly without edema. Tiny left pleural effusion. Electronically Signed   By: Inge Rise M.D.   On: 03/31/2017 13:47   Dg Abd Portable 1v-small Bowel Obstruction Protocol-initial, 8 Hr Delay  Result Date: 04/01/2017 CLINICAL DATA:  Small bowel obstruction 8 hour delay EXAM: PORTABLE ABDOMEN - 1 VIEW COMPARISON:  03/31/2017 FINDINGS: Partially visualized cardiac pacing leads. Esophageal tube tip is in the left upper quadrant. Vertebroplasty changes. Decreased small bowel distention since the prior radiograph. Majority of the contrast is in the colon and rectum. IMPRESSION: Decreased small bowel distention since the prior radiograph. Majority of the contrast is in the colon and rectum Electronically Signed   By: Donavan Foil M.D.   On: 04/01/2017 02:58   Dg Abd Portable 1v-small Bowel Protocol-position Verification  Result Date: 03/31/2017 CLINICAL DATA:  83 y/o  F; nasogastric tube placement. EXAM: PORTABLE ABDOMEN - 1 VIEW COMPARISON:  03/31/2017 abdomen radiograph. FINDINGS: Persistent dilated small bowel. Contrast is retained within the bladder. Multiple compression deformities with above lumbar kyphoplasty are grossly stable. Median sternotomy wires and 2 pacemaker leads noted. Enteric tube tip projects over the gastric body. IMPRESSION:  Enteric tube tip projects over gastric body. Stable dilated small bowel compatible with obstruction. Electronically Signed   By: Kristine Garbe M.D.   On: 03/31/2017 15:59    Anti-infectives: Anti-infectives    None      Assessment/Plan: p SBO. Improving.  Advance diet d/c NGT    LOS: 1 day    [facility] 04/01/2017

## 2017-04-02 LAB — CBC
HCT: 33.9 % — ABNORMAL LOW (ref 36.0–46.0)
Hemoglobin: 11.2 g/dL — ABNORMAL LOW (ref 12.0–15.0)
MCH: 30.3 pg (ref 26.0–34.0)
MCHC: 33 g/dL (ref 30.0–36.0)
MCV: 91.6 fL (ref 78.0–100.0)
PLATELETS: 227 10*3/uL (ref 150–400)
RBC: 3.7 MIL/uL — ABNORMAL LOW (ref 3.87–5.11)
RDW: 12.9 % (ref 11.5–15.5)
WBC: 7 10*3/uL (ref 4.0–10.5)

## 2017-04-02 LAB — URINE CULTURE: Culture: >=100000 — AB

## 2017-04-02 MED ORDER — OXYCODONE HCL 5 MG PO TABS: 5.0000 mg | ORAL_TABLET | ORAL | 0 refills | Status: DC | PRN

## 2017-04-02 MED ORDER — MORPHINE SULFATE 15 MG PO TABS: 15.0000 mg | ORAL_TABLET | ORAL | 0 refills | Status: DC | PRN

## 2017-04-02 NOTE — Care Management Note (Signed)
Case Management Note  Patient Details  Name: Donna Ortega MRN: [id number] Date of Birth: 1925-10-19  Subjective/Objective:  83 y.o. F to be discharged today with resumption of [facility] services.   Denies need for DME has RW                Action/Plan: CM will sign off for now but will be available should additional discharge needs arise or disposition change.    Expected Discharge Date:  04/02/17               Expected Discharge Plan:  [place]  In-House Referral:     Discharge planning Services  CM Consult  Post Acute Care Choice:  Home Health Choice offered to:  Patient  DME Arranged:   (Refused DME states has RW) DME Agency:     HH Arranged:  RN, PT, OT, Social Work [employer] Agency:  [place]  Status of Service:  Completed, signed off  If discussed at [employer], dates discussed:    Additional Comments:  Delrae Sawyers, RN 04/02/2017, 11:03 AM

## 2017-04-02 NOTE — Progress Notes (Signed)
Subjective/Chief Complaint: Tolerated diet advance.  Passing gas, had stools.  No n/v.     Objective: Vital signs in last 24 hours: Temp:  [97.8 F (36.6 C)-98.6 F (37 C)] 97.8 F (36.6 C) (08/19 0550) Pulse Rate:  [98-103] 102 (08/19 0550) Resp:  [16-18] 18 (08/19 0550) BP: (122-152)/(66-87) 152/87 (08/19 0550) SpO2:  [94 %-95 %] 94 % (08/19 0550) Last BM Date: 04/02/17  Intake/Output from previous day: 08/18 0701 - 08/19 0700 In: 360 [P.O.:360] Out: -  Intake/Output this shift: No intake/output data recorded.  General appearance: alert, cooperative and no distress Resp: breathing comfortably GI: soft, trace distention, non tender.    Lab Results:   Recent Labs  04/01/17 0531 04/02/17 0523  WBC 8.2 7.0  HGB 11.3* 11.2*  HCT 33.3* 33.9*  PLT 235 227   BMET  Recent Labs  03/31/17 1004 04/01/17 0531  NA 131* 136  K 4.2 3.6  CL 95* 101  CO2 25 25  GLUCOSE 124* 89  BUN 24* 27*  CREATININE 1.02* 0.95  CALCIUM 9.5 8.6*   PT/INR  Recent Labs  04/01/17 0531  LABPROT 15.2  INR 1.20   ABG No results for input(s): PHART, HCO3 in the last 72 hours.  Invalid input(s): PCO2, PO2  Studies/Results: Dg Abd 1 View  Result Date: 03/31/2017 CLINICAL DATA:  Abdominal distention, nausea and vomiting. EXAM: ABDOMEN - 1 VIEW COMPARISON:  CT abdomen and pelvis from same day. FINDINGS: Multiple dilated loops of small bowel are seen. Contrast is noted within the bilateral renal collecting systems and the decompressed bladder. Foley catheter in the bladder. IMPRESSION: Small bowel obstruction as seen on CT from same day. Electronically Signed   By: Titus Dubin M.D.   On: 03/31/2017 13:46   Ct Abdomen Pelvis W Contrast  Result Date: 03/31/2017 CLINICAL DATA:  Abdominal distention. EXAM: CT ABDOMEN AND PELVIS WITH CONTRAST TECHNIQUE: Multidetector CT imaging of the abdomen and pelvis was performed using the standard protocol following bolus administration of  intravenous contrast. CONTRAST:  12mL ISOVUE-300 IOPAMIDOL (ISOVUE-300) INJECTION 61% COMPARISON:  03/25/2017 FINDINGS: Lower chest: Small bilateral pleural effusions identified. Aortic atherosclerosis. Calcification in the RCA coronary artery noted. Hepatobiliary: No focal liver abnormality is seen. No gallstones, gallbladder wall thickening, or biliary dilatation. Pancreas: Unremarkable. No pancreatic ductal dilatation or surrounding inflammatory changes. Spleen: Normal in size without focal abnormality. Adrenals/Urinary Tract: The adrenal glands appear normal. Normal appearance of the kidneys. The urinary bladder is partially collapsed around a Foley catheter balloon. Stomach/Bowel: Moderate distension of the stomach. Small bowel loops are dilated and there are small bowel air-fluid levels. The bowel loops measure up to 3.5 cm in diameter. Transition to decreased caliber small bowel loops identified within the left lower quadrant of the abdomen, image 57 of series 2. The ileum appears decreased in caliber. Numerous colonic diverticula noted without evidence for acute diverticulitis. No pathologic dilatation of the colon. Vascular/Lymphatic: Aortic atherosclerosis. No aneurysm. No enlarged upper abdominal or pelvic lymph nodes. No inguinal adenopathy. Reproductive: Status post hysterectomy. No adnexal masses. Other: No free fluid or fluid collections. Musculoskeletal: Multi level compression fractures are identified throughout the thoracic and lumbar spine including: T10, T12, L1, L3 and L4. IMPRESSION: 1. Examination is positive for small bowel obstruction. Transition point is identified within the left lower quadrant of the abdomen. 2.  Aortic Atherosclerosis (ICD10-I70.0). 3. Multiple thoracic and lumbar compression deformities as detailed on 03/25/2017 CT. Electronically Signed   By: Kerby Moors M.D.   On:  03/31/2017 11:47   Dg Chest Portable 1 View  Result Date: 03/31/2017 CLINICAL DATA:  Status post  NG tube placement. EXAM: PORTABLE CHEST 1 VIEW COMPARISON:  PA and lateral chest 03/27/2017. FINDINGS: NG tube is in place with the tip in the distal esophagus. The tube should be advanced 11-12 cm for positioning of the side-port in the stomach. There is cardiomegaly without edema. Tiny left effusion is noted. There is some left basilar atelectasis. 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 IMPRESSION: NG tube tip is in the distal esophagus. The tube should be advanced 11-12 cm. Cardiomegaly without edema. Tiny left pleural effusion. Electronically Signed   By: Inge Rise M.D.   On: 03/31/2017 13:47   Dg Abd Portable 1v-small Bowel Obstruction Protocol-initial, 8 Hr Delay  Result Date: 04/01/2017 CLINICAL DATA:  Small bowel obstruction 8 hour delay EXAM: PORTABLE ABDOMEN - 1 VIEW COMPARISON:  03/31/2017 FINDINGS: Partially visualized cardiac pacing leads. Esophageal tube tip is in the left upper quadrant. Vertebroplasty changes. Decreased small bowel distention since the prior radiograph. Majority of the contrast is in the colon and rectum. IMPRESSION: Decreased small bowel distention since the prior radiograph. Majority of the contrast is in the colon and rectum Electronically Signed   By: Donavan Foil M.D.   On: 04/01/2017 02:58   Dg Abd Portable 1v-small Bowel Protocol-position Verification  Result Date: 03/31/2017 CLINICAL DATA:  83 y/o  F; nasogastric tube placement. EXAM: PORTABLE ABDOMEN - 1 VIEW COMPARISON:  03/31/2017 abdomen radiograph. FINDINGS: Persistent dilated small bowel. Contrast is retained within the bladder. Multiple compression deformities with above lumbar kyphoplasty are grossly stable. Median sternotomy wires and 2 pacemaker leads noted. Enteric tube tip projects over the gastric body. IMPRESSION: Enteric tube tip projects over gastric body. Stable dilated small bowel compatible with obstruction. Electronically Signed   By: Kristine Garbe M.D.    On: 03/31/2017 15:59    Anti-infectives: Anti-infectives    None      Assessment/Plan: p SBO. Improving.  Continue diet advance. Home later today if tolerates.   LOS: 2 days    [facility] 04/02/2017

## 2017-04-02 NOTE — Progress Notes (Signed)
Reviewed AVS and discharge summary with pt and personal sitter, Otila Kluver. They verbalized understanding. Pt discharged home in stable condition w/pain controlled, prescriptions, and all belongings via [place].

## 2017-04-02 NOTE — Discharge Summary (Signed)
Physician Discharge Summary  Donna Ortega:[id number] DOB: 1925-09-07 DOA: 03/31/2017  PCP: Lanice Shirts, MD  Admit date: 03/31/2017 Discharge date: 04/02/2017  Admitted From: home Disposition:  Home  Recommendations for Outpatient Follow-up:  1. Follow up with PCP in 1-2 weeks 2. Follow-up with her for kyphoplasty as an outpatient.  Home Health:No Equipment/Devices: Rolling walker  Discharge Condition:Stable CODE STATUS:full Diet recommendation: Heart Healthy  Brief/Interim Summary: 83 y.o. female past medical history of multiple compression fractures, treated with narcotics came into the hospital complaining of nausea vomiting and abdominal pain, her last bowel movement was a few days ago.   Discharge Diagnoses:  Active Problems:   Normocytic anemia   Compressed spine fracture (HCC)   Small bowel obstruction (HCC)   AKI (acute kidney injury) ([place])  Small bowel obstruction: Surgery was consulted she was treated conservatively, by the next day she started having bowel movements and passing gas. She was tolerating her diet. Her diet was advanced which she tolerated. Physical therapy was consulted who recommended a rolling walker.  Normocytic anemia: At baseline.  Essential hypertension: No changes were made.  Compression fracture of a spine: She was continue narcotic which controlled her pain she'll continue MiraLAX as an outpatient.  Acute kidney injury: Likely prerenal in etiology resolved with IV fluid hydration.  Discharge Instructions  Discharge Instructions    Diet - low sodium heart healthy    Complete by:  As directed    Increase activity slowly    Complete by:  As directed      Allergies as of 04/02/2017      Reactions   Amiodarone Hives   ALL OVER BODY HIVES/ ITCH   Aspirin Other (See Comments)   Gi bleed   Lipitor [atorvastatin Calcium] Other (See Comments)   MUSCLE ACHES   Nsaids Other (See Comments)   GI bleed   Tolmetin Other  (See Comments)   GI bleed   Naproxen Other (See Comments)   Niacin Rash   Niaspan [niacin Er] Rash      Medication List    TAKE these medications   alendronate 70 MG tablet Commonly known as:  FOSAMAX Take 70 mg by mouth once a week. Take on Thursday with a full glass of water on an empty stomach.   CALTRATE 600+D PO Take 1 tablet by mouth daily.   CENTRUM SILVER ADULT 50+ PO Take 1 tablet by mouth daily.   diltiazem 240 MG 24 hr capsule Commonly known as:  CARDIZEM CD TAKE 1 CAPSULE (240 MG TOTAL) BY MOUTH DAILY.   diphenhydramine-acetaminophen 25-500 MG Tabs tablet Commonly known as:  TYLENOL PM Take 2 tablets by mouth at bedtime.   ELIQUIS 2.5 MG Tabs tablet Generic drug:  apixaban TAKE 1 TABLET BY MOUTH TWICE A DAY What changed:  See the new instructions.   metoprolol tartrate 25 MG tablet Commonly known as:  LOPRESSOR Take 1 tablet (25 mg total) by mouth 2 (two) times daily. What changed:  how much to take   morphine 15 MG tablet Commonly known as:  MSIR Take 1 tablet (15 mg total) by mouth every 4 (four) hours as needed for severe pain.   MOVANTIK 25 MG Tabs tablet Generic drug:  naloxegol oxalate Take 25 mg by mouth daily as needed (constipation).   ondansetron 4 MG tablet Commonly known as:  ZOFRAN Take 4 mg by mouth every 8 (eight) hours as needed for nausea.   oxyCODONE 5 MG immediate release tablet Commonly known as:  ROXICODONE Take 1 tablet (5 mg total) by mouth every 4 (four) hours as needed for severe pain.   pantoprazole 40 MG tablet Commonly known as:  PROTONIX TAKE 1 TABLET (40 MG TOTAL) BY MOUTH DAILY.   pravastatin 80 MG tablet Commonly known as:  PRAVACHOL TAKE 1 TABLET (80 MG TOTAL) BY MOUTH DAILY.       Allergies  Allergen Reactions  . Amiodarone Hives    ALL OVER BODY HIVES/ ITCH   . Aspirin Other (See Comments)    Gi bleed   . Lipitor [Atorvastatin Calcium] Other (See Comments)    MUSCLE ACHES  . Nsaids Other (See  Comments)    GI bleed  . Tolmetin Other (See Comments)    GI bleed  . Naproxen Other (See Comments)  . Niacin Rash  . Niaspan [Niacin Er] Rash    Consultations:  Surgery   Procedures/Studies: Dg Chest 2 View  Result Date: 03/27/2017 CLINICAL DATA:  Hypernatremia, UTI, increased confusion, back pain, hypertension, coronary artery disease EXAM: CHEST  2 VIEW COMPARISON:  10/16/2013 FINDINGS: LEFT subclavian sequential pacemaker leads project over RIGHT atrium and RIGHT ventricle. Enlargement of cardiac silhouette post CABG. Atherosclerotic calcification aorta. Mediastinal contours and pulmonary vascularity otherwise normal. Bibasilar atelectasis. Lungs otherwise clear. No infiltrate, pleural effusion or pneumothorax. Bones demineralized with multiple thoracolumbar compression fractures and prior vertebroplasty at a single level. IMPRESSION: Enlargement of cardiac silhouette post CABG and pacemaker. Bibasilar atelectasis. Osteoporosis with multiple thoracolumbar compression fractures. Electronically Signed   By: Lavonia Dana M.D.   On: 03/27/2017 14:42   Dg Abd 1 View  Result Date: 03/31/2017 CLINICAL DATA:  Abdominal distention, nausea and vomiting. EXAM: ABDOMEN - 1 VIEW COMPARISON:  CT abdomen and pelvis from same day. FINDINGS: Multiple dilated loops of small bowel are seen. Contrast is noted within the bilateral renal collecting systems and the decompressed bladder. Foley catheter in the bladder. IMPRESSION: Small bowel obstruction as seen on CT from same day. Electronically Signed   By: Titus Dubin M.D.   On: 03/31/2017 13:46   Ct Head Wo Contrast  Result Date: 03/27/2017 CLINICAL DATA:  Confusion. EXAM: CT HEAD WITHOUT CONTRAST TECHNIQUE: Contiguous axial images were obtained from the base of the skull through the vertex without intravenous contrast. COMPARISON:  CT head dated August 14, 2015. FINDINGS: Brain: No evidence of acute infarction, hemorrhage, hydrocephalus, extra-axial  collection or mass lesion/mass effect. Right frontal encephalomalacia, unchanged. Moderate age-related cerebral atrophy with compensatory dilatation of the ventricles. Periventricular white matter and corona radiata hypodensities favor chronic ischemic microvascular white matter disease. Vascular: Atherosclerotic vascular calcification of the carotid siphons. No hyperdense vessel Skull: Osteopenia.  No fracture or focal lesion. Sinuses/Orbits: The bilateral paranasal sinuses and mastoid air cells are clear. Bilateral pseudophakia. Other: None. IMPRESSION: 1.  No acute intracranial abnormality. 2. Unchanged right frontal encephalomalacia and chronic ischemic microvascular white matter disease. Electronically Signed   By: Titus Dubin M.D.   On: 03/27/2017 15:12   Ct Abdomen Pelvis W Contrast  Result Date: 03/31/2017 CLINICAL DATA:  Abdominal distention. EXAM: CT ABDOMEN AND PELVIS WITH CONTRAST TECHNIQUE: Multidetector CT imaging of the abdomen and pelvis was performed using the standard protocol following bolus administration of intravenous contrast. CONTRAST:  41mL ISOVUE-300 IOPAMIDOL (ISOVUE-300) INJECTION 61% COMPARISON:  03/25/2017 FINDINGS: Lower chest: Small bilateral pleural effusions identified. Aortic atherosclerosis. Calcification in the RCA coronary artery noted. Hepatobiliary: No focal liver abnormality is seen. No gallstones, gallbladder wall thickening, or biliary dilatation. Pancreas: Unremarkable. No pancreatic ductal  dilatation or surrounding inflammatory changes. Spleen: Normal in size without focal abnormality. Adrenals/Urinary Tract: The adrenal glands appear normal. Normal appearance of the kidneys. The urinary bladder is partially collapsed around a Foley catheter balloon. Stomach/Bowel: Moderate distension of the stomach. Small bowel loops are dilated and there are small bowel air-fluid levels. The bowel loops measure up to 3.5 cm in diameter. Transition to decreased caliber small  bowel loops identified within the left lower quadrant of the abdomen, image 57 of series 2. The ileum appears decreased in caliber. Numerous colonic diverticula noted without evidence for acute diverticulitis. No pathologic dilatation of the colon. Vascular/Lymphatic: Aortic atherosclerosis. No aneurysm. No enlarged upper abdominal or pelvic lymph nodes. No inguinal adenopathy. Reproductive: Status post hysterectomy. No adnexal masses. Other: No free fluid or fluid collections. Musculoskeletal: Multi level compression fractures are identified throughout the thoracic and lumbar spine including: T10, T12, L1, L3 and L4. IMPRESSION: 1. Examination is positive for small bowel obstruction. Transition point is identified within the left lower quadrant of the abdomen. 2.  Aortic Atherosclerosis (ICD10-I70.0). 3. Multiple thoracic and lumbar compression deformities as detailed on 03/25/2017 CT. Electronically Signed   By: Kerby Moors M.D.   On: 03/31/2017 11:47   Ct L-spine No Charge  Result Date: 03/25/2017 CLINICAL DATA:  Low back pain. History of multiple compression fractures. EXAM: CT LUMBAR SPINE WITHOUT CONTRAST TECHNIQUE: Multidetector CT imaging of the lumbar spine was performed without intravenous contrast administration. Multiplanar CT image reconstructions were also generated. COMPARISON:  CT scan 08/30/2015 FINDINGS: Segmentation: 5 lumbar type vertebral bodies. The last full intervertebral disc space is labeled L5-S1. This correlates with the prior CT scans. Alignment: Normal overall alignment. There is mild degenerative anterolisthesis of L4. Vertebrae: Remote severe compression fracture of L3 with vertebral plana appearance and stable retropulsion of approximately 7 mm. Stable vertebral augmentation changes at L1 without complicating features. There is a new compression fracture T12 when compared the prior CT scan. This could be acute or subacute. Mild paraspinal edema is noted. Remote compression  fracture of T10 without progressive findings. The L2 and L5 vertebral bodies appear normal in stable. Mild stable compression deformity of L4. The facets are normally aligned. Stable facet disease but no pars defects. Paraspinal and other soft tissues: No significant findings. Stable advanced atherosclerotic calcifications involving the aorta and branch vessel ostia. Disc levels: T11-12: Minimal retropulsion of T12 but no canal compromise. No spinal or foraminal stenosis. T12-L1: No disc protrusion, spinal or foraminal stenosis. Stable retropulsion of L1 but no significant canal stenosis. L1-2:  Mild bulging annulus but no spinal or foraminal stenosis. The L2-3: Mild bulging annulus with a shallow rim calcified disc protrusion on the right. There is mild right lateral recess and right foraminal encroachment. Significant retropulsion of L3 with canal stenosis which is stable. L3-4: Annular bulge but no significant spinal or foraminal stenosis. L4-5: Bulging and uncovered disc and osteophytic ridging with mild spinal and lateral recess stenosis. No significant foraminal stenosis. L5-S1:  No significant spinal or foraminal stenosis. IMPRESSION: 1. Remote compression fractures of T10, L1, L3 and L4. Stable vertebral augmentation changes at L1. 2. New compression fracture of T12 since the prior study. This could be acute or subacute. No significant retropulsion or canal compromise. 3. Stable multilevel disc disease and facet disease without significant spinal or foraminal stenosis. Electronically Signed   By: Marijo Sanes M.D.   On: 03/25/2017 12:25   Dg Chest Portable 1 View  Result Date: 03/31/2017 CLINICAL DATA:  Status  post NG tube placement. EXAM: PORTABLE CHEST 1 VIEW COMPARISON:  PA and lateral chest 03/27/2017. FINDINGS: NG tube is in place with the tip in the distal esophagus. The tube should be advanced 11-12 cm for positioning of the side-port in the stomach. There is cardiomegaly without edema. Tiny left  effusion is noted. There is some left basilar atelectasis. 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 IMPRESSION: NG tube tip is in the distal esophagus. The tube should be advanced 11-12 cm. Cardiomegaly without edema. Tiny left pleural effusion. Electronically Signed   By: Inge Rise M.D.   On: 03/31/2017 13:47   Dg Abd Portable 1v-small Bowel Obstruction Protocol-initial, 8 Hr Delay  Result Date: 04/01/2017 CLINICAL DATA:  Small bowel obstruction 8 hour delay EXAM: PORTABLE ABDOMEN - 1 VIEW COMPARISON:  03/31/2017 FINDINGS: Partially visualized cardiac pacing leads. Esophageal tube tip is in the left upper quadrant. Vertebroplasty changes. Decreased small bowel distention since the prior radiograph. Majority of the contrast is in the colon and rectum. IMPRESSION: Decreased small bowel distention since the prior radiograph. Majority of the contrast is in the colon and rectum Electronically Signed   By: Donavan Foil M.D.   On: 04/01/2017 02:58   Dg Abd Portable 1v-small Bowel Protocol-position Verification  Result Date: 03/31/2017 CLINICAL DATA:  83 y/o  F; nasogastric tube placement. EXAM: PORTABLE ABDOMEN - 1 VIEW COMPARISON:  03/31/2017 abdomen radiograph. FINDINGS: Persistent dilated small bowel. Contrast is retained within the bladder. Multiple compression deformities with above lumbar kyphoplasty are grossly stable. Median sternotomy wires and 2 pacemaker leads noted. Enteric tube tip projects over the gastric body. IMPRESSION: Enteric tube tip projects over gastric body. Stable dilated small bowel compatible with obstruction. Electronically Signed   By: Kristine Garbe M.D.   On: 03/31/2017 15:59   Ct Renal Stone Study  Result Date: 03/25/2017 CLINICAL DATA:  Lower abdominal pain and back pain currently being treated for UTI. EXAM: CT ABDOMEN AND PELVIS WITHOUT CONTRAST TECHNIQUE: Multidetector CT imaging of the abdomen and pelvis was performed following the  standard protocol without IV contrast. COMPARISON:  Chest CT 10/25/2013 and thoracolumbar spine CT 08/30/2015 FINDINGS: Lower chest: Lung bases are unremarkable. Median sternotomy wires are present. Cardiac pacer leads are present. Atherosclerotic coronary artery disease post CABG. Hepatobiliary: Liver and biliary tree are within normal. Gallbladder is unremarkable. Pancreas: Within normal. Spleen: Within normal. Adrenals/Urinary Tract: Adrenal glands are normal. Kidneys normal in size without hydronephrosis or nephrolithiasis. Ureters and bladder are normal. Stomach/Bowel: Stomach and small bowel are within normal. Appendix is normal. Moderate diverticulosis throughout the colon most prominent over the sigmoid colon. Vascular/Lymphatic: Moderate calcified plaque over the abdominal aorta and iliac arteries. No adenopathy. Reproductive: Previous hysterectomy. Other: No free fluid or focal inflammatory change. Musculoskeletal: Degenerate changes spine. Stable severe compression fracture of T10 with mild interval progression of moderate T12 compression fracture. Stable moderate compression fracture of L1 post kyphoplasty. Stable severe L3 compression fracture and stable moderate L4 compression fracture. Stable retropulsion of the posterior aspect of the L3 compression fracture with mild resulting canal stenosis unchanged. Subtle grade 1 anterolisthesis of L4 on L5 unchanged. IMPRESSION: No acute findings in the abdomen/pelvis. Significant diverticulosis throughout the colon without active inflammation. Numerous stable compression fractures of the thoracolumbar spine as described with slight interval progression of the T12 fracture. Stable grade 1 anterolisthesis of L4 on L5. Stable mild canal stenosis at the L3 level due to retropulsion of the posterior border of the associated  compression fracture. Post CABG. Aortic Atherosclerosis (ICD10-I70.0). Electronically Signed   By: Marin Olp M.D.   On: 03/25/2017 12:08      Subjective: No new complaints. Pain is controlled.  Discharge Exam: Vitals:   04/01/17 2049 04/02/17 0550  BP: 127/74 (!) 152/87  Pulse: (!) 103 (!) 102  Resp: 16 18  Temp: 98.2 F (36.8 C) 97.8 F (36.6 C)  SpO2: 95% 94%   Vitals:   04/01/17 0521 04/01/17 1411 04/01/17 2049 04/02/17 0550  BP: (!) 148/76 122/66 127/74 (!) 152/87  Pulse: (!) 105 98 (!) 103 (!) 102  Resp: 16 16 16 18   Temp: 98.9 F (37.2 C) 98.6 F (37 C) 98.2 F (36.8 C) 97.8 F (36.6 C)  TempSrc: Oral Oral Oral Oral  SpO2: 96% 94% 95% 94%  Weight:      Height:        General: Pt is alert, awake, not in acute distress Cardiovascular: RRR, S1/S2 +, no rubs, no gallops Respiratory: CTA bilaterally, no wheezing, no rhonchi Abdominal: Soft, NT, ND, bowel sounds + Extremities: no edema, no cyanosis    The results of significant diagnostics from this hospitalization (including imaging, microbiology, ancillary and laboratory) are listed below for reference.     Microbiology: Recent Results (from the past 240 hour(s))  Urine culture     Status: Abnormal   Collection Time: 03/25/17 11:25 AM  Result Value Ref Range Status   Specimen Description URINE, CLEAN CATCH  Final   Special Requests NONE  Final   Culture <10,000 COLONIES/mL INSIGNIFICANT GROWTH (A)  Final   Report Status 03/26/2017 FINAL  Final  Urine culture     Status: Abnormal   Collection Time: 03/31/17 12:09 PM  Result Value Ref Range Status   Specimen Description URINE, RANDOM  Final   Special Requests NONE  Final   Culture (A)  Final    >=100,000 COLONIES/mL STAPHYLOCOCCUS SPECIES (COAGULASE NEGATIVE)   Report Status 04/02/2017 FINAL  Final   Organism ID, Bacteria STAPHYLOCOCCUS SPECIES (COAGULASE NEGATIVE) (A)  Final      Susceptibility   Staphylococcus species (coagulase negative) - MIC*    CIPROFLOXACIN >=8 RESISTANT Resistant     GENTAMICIN <=0.5 SENSITIVE Sensitive     NITROFURANTOIN <=16 SENSITIVE Sensitive     OXACILLIN  >=4 RESISTANT Resistant     TETRACYCLINE <=1 SENSITIVE Sensitive     VANCOMYCIN 1 SENSITIVE Sensitive     TRIMETH/SULFA 80 RESISTANT Resistant     CLINDAMYCIN <=0.25 SENSITIVE Sensitive     RIFAMPIN <=0.5 SENSITIVE Sensitive     Inducible Clindamycin NEGATIVE Sensitive     * >=100,000 COLONIES/mL STAPHYLOCOCCUS SPECIES (COAGULASE NEGATIVE)     Labs: BNP (last 3 results) No results for input(s): BNP in the last 8760 hours. Basic Metabolic Panel:  Recent Labs Lab 03/27/17 1517 03/31/17 1004 04/01/17 0531  NA 129* 131* 136  K 4.0 4.2 3.6  CL 94* 95* 101  CO2 24 25 25   GLUCOSE 96 124* 89  BUN 15 24* 27*  CREATININE 1.01* 1.02* 0.95  CALCIUM 8.6* 9.5 8.6*  MG  --  1.8  --    Liver Function Tests:  Recent Labs Lab 03/27/17 1517 04/01/17 0531  AST 24 32  ALT 12* 23  ALKPHOS 53 52  BILITOT 0.6 0.7  PROT 6.7 5.9*  ALBUMIN 4.0 3.4*   No results for input(s): LIPASE, AMYLASE in the last 168 hours. No results for input(s): AMMONIA in the last 168 hours. CBC:  Recent  Labs Lab 03/27/17 1517 03/31/17 1004 04/01/17 0531 04/02/17 0523  WBC 8.1 10.3 8.2 7.0  NEUTROABS 5.9 9.1*  --   --   HGB 12.1 12.7 11.3* 11.2*  HCT 35.4* 37.3 33.3* 33.9*  MCV 91.2 92.6 92.8 91.6  PLT 199 249 235 227   Cardiac Enzymes: No results for input(s): CKTOTAL, CKMB, CKMBINDEX, TROPONINI in the last 168 hours. BNP: Invalid input(s): POCBNP CBG: No results for input(s): GLUCAP in the last 168 hours. D-Dimer No results for input(s): DDIMER in the last 72 hours. Hgb A1c No results for input(s): HGBA1C in the last 72 hours. Lipid Profile No results for input(s): CHOL, HDL, LDLCALC, TRIG, CHOLHDL, LDLDIRECT in the last 72 hours. Thyroid function studies No results for input(s): TSH, T4TOTAL, T3FREE, THYROIDAB in the last 72 hours.  Invalid input(s): FREET3 Anemia work up No results for input(s): VITAMINB12, FOLATE, FERRITIN, TIBC, IRON, RETICCTPCT in the last 72 hours. Urinalysis     Component Value Date/Time   COLORURINE YELLOW 03/31/2017 1209   APPEARANCEUR HAZY (A) 03/31/2017 1209   LABSPEC >1.046 (H) 03/31/2017 1209   PHURINE 5.0 03/31/2017 1209   GLUCOSEU NEGATIVE 03/31/2017 1209   HGBUR LARGE (A) 03/31/2017 1209   BILIRUBINUR NEGATIVE 03/31/2017 1209   BILIRUBINUR neg 09/24/2014 0907   KETONESUR 20 (A) 03/31/2017 1209   PROTEINUR 30 (A) 03/31/2017 1209   UROBILINOGEN negative 09/24/2014 0907   UROBILINOGEN 0.2 08/20/2011 2000   NITRITE NEGATIVE 03/31/2017 1209   LEUKOCYTESUR TRACE (A) 03/31/2017 1209   Sepsis Labs Invalid input(s): PROCALCITONIN,  WBC,  LACTICIDVEN Microbiology Recent Results (from the past 240 hour(s))  Urine culture     Status: Abnormal   Collection Time: 03/25/17 11:25 AM  Result Value Ref Range Status   Specimen Description URINE, CLEAN CATCH  Final   Special Requests NONE  Final   Culture <10,000 COLONIES/mL INSIGNIFICANT GROWTH (A)  Final   Report Status 03/26/2017 FINAL  Final  Urine culture     Status: Abnormal   Collection Time: 03/31/17 12:09 PM  Result Value Ref Range Status   Specimen Description URINE, RANDOM  Final   Special Requests NONE  Final   Culture (A)  Final    >=100,000 COLONIES/mL STAPHYLOCOCCUS SPECIES (COAGULASE NEGATIVE)   Report Status 04/02/2017 FINAL  Final   Organism ID, Bacteria STAPHYLOCOCCUS SPECIES (COAGULASE NEGATIVE) (A)  Final      Susceptibility   Staphylococcus species (coagulase negative) - MIC*    CIPROFLOXACIN >=8 RESISTANT Resistant     GENTAMICIN <=0.5 SENSITIVE Sensitive     NITROFURANTOIN <=16 SENSITIVE Sensitive     OXACILLIN >=4 RESISTANT Resistant     TETRACYCLINE <=1 SENSITIVE Sensitive     VANCOMYCIN 1 SENSITIVE Sensitive     TRIMETH/SULFA 80 RESISTANT Resistant     CLINDAMYCIN <=0.25 SENSITIVE Sensitive     RIFAMPIN <=0.5 SENSITIVE Sensitive     Inducible Clindamycin NEGATIVE Sensitive     * >=100,000 COLONIES/mL STAPHYLOCOCCUS SPECIES (COAGULASE NEGATIVE)     Time  coordinating discharge: Over 30 minutes  SIGNED:   Charlynne Cousins, MD  [facility] 04/02/2017, 9:16 AM Pager   If 7PM-7AM, please contact night-coverage [email or web address] Password [email or web address]

## 2017-04-03 ENCOUNTER — Other Ambulatory Visit: Payer: Medicare HMO

## 2017-04-03 ENCOUNTER — Other Ambulatory Visit: Payer: Self-pay | Admitting: Orthopedic Surgery

## 2017-04-03 DIAGNOSIS — S22000A Wedge compression fracture of unspecified thoracic vertebra, initial encounter for closed fracture: Secondary | ICD-10-CM

## 2017-04-04 ENCOUNTER — Ambulatory Visit
Admission: RE | Admit: 2017-04-04 | Discharge: 2017-04-04 | Disposition: A | Discharge status: Home / Self Care | Payer: Medicare HMO | Source: Ambulatory Visit | Attending: Orthopedic Surgery | Admitting: Orthopedic Surgery

## 2017-04-04 ENCOUNTER — Ambulatory Visit
Admission: RE | Admit: 2017-04-04 | Discharge: 2017-04-04 | Disposition: A | Discharge status: Home / Self Care | Payer: Self-pay | Source: Ambulatory Visit | Attending: Orthopedic Surgery | Admitting: Orthopedic Surgery

## 2017-04-04 ENCOUNTER — Other Ambulatory Visit: Payer: Self-pay | Admitting: Orthopedic Surgery

## 2017-04-04 ENCOUNTER — Other Ambulatory Visit (HOSPITAL_COMMUNITY): Payer: Self-pay | Admitting: Interventional Radiology

## 2017-04-04 DIAGNOSIS — S22000A Wedge compression fracture of unspecified thoracic vertebra, initial encounter for closed fracture: Secondary | ICD-10-CM

## 2017-04-04 DIAGNOSIS — S22080A Wedge compression fracture of T11-T12 vertebra, initial encounter for closed fracture: Secondary | ICD-10-CM | POA: Diagnosis not present

## 2017-04-04 DIAGNOSIS — M545 Low back pain: Secondary | ICD-10-CM | POA: Diagnosis not present

## 2017-04-04 HISTORY — PX: IR RADIOLOGIST EVAL & MGMT: IMG5224

## 2017-04-04 NOTE — Consult Note (Signed)
Chief Complaint: I have new back pain.   Referring Physician(s): Gioffre,Ronald  History of Present Illness: Donna Ortega is a 83 y.o. female presenting today as a scheduled appointment to [facility], kindly referred by Dr. Gladstone Lighter, for new lifestyle limiting lower back pain.    She is here with her son for the consultation.   Her son and her tell me that although she did not have a trauma or a fall, and there was no event that she remembers that started the pain, she clearly remembers new terrible back pain starting about 2 weeks ago.    She describes the pain as a deep pain, of at least 8-9/10 intensity, which is present at most times.  She has been taking a combination of over the counter medication as well as prescription medication.  She was previously taking oxycodone, but developed obstipation and was admitted with GI obstructive symptoms recently.  Her discharge was 04/02/2017.  Now she is only taking occasional tramadol for break-though pain, but continues to have symptoms of back pain.   She is unable to participate in her activities of daily living like previously.  She lives with her family in an in-law suite, and has difficulty with stairs, ambulating without her rolling walker (which is new), and other activities.    She has a history of multiple compression fractures.  She had L1 fracture treated with vertebral augmentation about 2-3 years ago.  She remembers having good relief from that treatment.    She has prior imaging of her lumbar spine performed January of 2017 which shows irregular superior endplate of J28, but no fracture line.  Her newest CT imaging shows a new fracture line of the superior endplate of N86, with increasing height loss compared to 18 months ago.   She has not had MRI.    I had a discussion with her and her son regarding the anatomy, pathology, pathophysiology of compression fractures, and the logistics of vertebral augmentation, which they are  familiar.  Specific risks discussed include bleeding, infection, need for further procedure/surgery, ongoing pain, embolization, non-target cement, cardiopulmonary collapse, death.    The goals of our therapy are to provide pain relief, with decreased need for strong pain medications, and to return to comfortable activities.    Past Medical History:  Diagnosis Date  . Arthritis    osteo   in back  . Coronary artery disease 2007   3 vessel CABG with LIMA-LAD, SVG to diag, and SVG - Ramus  . Diverticulitis   . GI bleeding 09/09/2013  . Hearing loss of left ear 07/2013  . History of atrial fibrillation; review of all ECG are most consistent with atrial tachycardia    had rash with amiodarone; no anticoagulation due to GI bleed in December 2012  . History of GI bleed Dec 2012  . History of hyperkalemia   . Hyperlipidemia   . Hypertension   . Hyponatremia   . Pacemaker-[employer] 02/09/2012  . Palpitations     Past Surgical History:  Procedure Laterality Date  . ABDOMINAL HYSTERECTOMY    . COLONOSCOPY N/A 09/12/2013   Procedure: COLONOSCOPY;  Surgeon: Wonda Horner, MD;  Location: [facility] ENDOSCOPY;  Service: Endoscopy;  Laterality: N/A;  . CORONARY ARTERY BYPASS GRAFT  Feb 2007   Dr. Cyndia Bent;   . ESOPHAGOGASTRODUODENOSCOPY  08/04/2011   Procedure: ESOPHAGOGASTRODUODENOSCOPY (EGD);  Surgeon: Cleotis Nipper, MD;  Location: [facility] ENDOSCOPY;  Service: Endoscopy;  Laterality: N/A;  do at bedside  .  ESOPHAGOGASTRODUODENOSCOPY N/A 09/11/2013   Procedure: ESOPHAGOGASTRODUODENOSCOPY (EGD);  Surgeon: Jeryl Columbia, MD;  Location: [facility] ENDOSCOPY;  Service: Endoscopy;  Laterality: N/A;  . MAZE    . pace maker    . PARTIAL HYSTERECTOMY    . PERMANENT PACEMAKER INSERTION N/A 02/08/2012   Procedure: PERMANENT PACEMAKER INSERTION;  Surgeon: Deboraha Sprang, MD;  Location: [facility] CATH LAB;  Service: Cardiovascular;  Laterality: N/A;    Allergies: Amiodarone; Aspirin; Lipitor [atorvastatin calcium]; Nsaids;  Tolmetin; Naproxen; Niacin; and Niaspan [niacin er]  Medications: Prior to Admission medications   Medication Sig Start Date End Date Taking? Authorizing Provider  acetaminophen (TYLENOL) 500 MG tablet Take 1,000 mg by mouth every 4 (four) hours as needed.   Yes [provider]  alendronate (FOSAMAX) 70 MG tablet Take 70 mg by mouth once a week. Take on Thursday with a full glass of water on an empty stomach.   Yes [provider]  Calcium Carbonate-Vitamin D (CALTRATE 600+D PO) Take 1 tablet by mouth daily.    Yes [provider]  diltiazem (CARDIZEM CD) 240 MG 24 hr capsule TAKE 1 CAPSULE (240 MG TOTAL) BY MOUTH DAILY. 11/25/16  Yes Nahser, Wonda Cheng, MD  diphenhydramine-acetaminophen (TYLENOL PM) 25-500 MG TABS tablet Take 2 tablets by mouth at bedtime.   Yes [provider]  ELIQUIS 2.5 MG TABS tablet TAKE 1 TABLET BY MOUTH TWICE A DAY Patient taking differently: TAKE 2.5MG  BY MOUTH TWICE A DAY 07/26/16  Yes Nahser, Wonda Cheng, MD  metoprolol tartrate (LOPRESSOR) 25 MG tablet Take 1 tablet (25 mg total) by mouth 2 (two) times daily. Patient taking differently: Take 12.5 mg by mouth 2 (two) times daily.  03/08/17 06/06/17 Yes Deboraha Sprang, MD  MOVANTIK 25 MG TABS tablet Take 25 mg by mouth daily as needed (constipation).  03/30/17  Yes [provider]  Multiple Vitamins-Minerals (CENTRUM SILVER ADULT 50+ PO) Take 1 tablet by mouth daily.   Yes [provider]  ondansetron (ZOFRAN) 4 MG tablet Take 4 mg by mouth every 8 (eight) hours as needed for nausea. 03/30/17  Yes [provider]  pantoprazole (PROTONIX) 40 MG tablet TAKE 1 TABLET (40 MG TOTAL) BY MOUTH DAILY. 09/17/14  Yes Schoenhoff, Altamese Cabal, MD  pravastatin (PRAVACHOL) 80 MG tablet TAKE 1 TABLET (80 MG TOTAL) BY MOUTH DAILY. 05/25/15  Yes Nahser, Wonda Cheng, MD  morphine (MSIR) 15 MG tablet Take 1 tablet (15 mg total) by mouth every 4 (four) hours as needed for severe  pain. Patient not taking: Reported on 04/04/2017 04/02/17   Charlynne Cousins, MD  oxyCODONE (ROXICODONE) 5 MG immediate release tablet Take 1 tablet (5 mg total) by mouth every 4 (four) hours as needed for severe pain. Patient not taking: Reported on 04/04/2017 04/02/17   Charlynne Cousins, MD     Family History  Problem Relation Age of Onset  . Heart failure Father   . Kidney failure Father   . Bone cancer Brother   . Kidney failure Sister   . Colon cancer Sister   . Cancer Sister     Social History   Social History  . Marital status: Widowed    Spouse name: N/A  . Number of children: N/A  . Years of education: N/A   Social History Main Topics  . Smoking status: Never Smoker  . Smokeless tobacco: Never Used  . Alcohol use No  . Drug use: No  . Sexual activity: No   Other Topics  Concern  . Not on file   Social History Narrative   Ambulatory usually independently, plays golf   Lives at home with family   Daughter in law Natasja Niday [phone]     Review of Systems: A 12 point ROS discussed and pertinent positives are indicated in the HPI above.  All other systems are negative.  Review of Systems  Vital Signs: BP 134/83   Pulse 99   Temp 98.1 F (36.7 C) (Oral)   Resp 15   Ht 5\' 4"  (1.626 m)   Wt 140 lb (63.5 kg)   SpO2 96%   BMI 24.03 kg/m   Physical Exam General: 83 yo female appearing   stated age.  Well-developed, well-nourished.  No distress. Well-kempt HEENT: Atraumatic, normocephalic.  Conjugate gaze, extra-ocular motor intact. No scleral icterus or scleral injection. No lesions on external ears, nose, lips, or gums.  Oral mucosa moist, pink.  Neck: Symmetric with no goiter enlargement.  Chest/Lungs:  Symmetric chest with inspiration/expiration.  No labored breathing.  Clear to auscultation with no wheezes, rhonchi, or rales.  Heart:  RRR, with no third heart sounds appreciated. No JVD appreciated.  Abdomen:  Soft, NT/ND, with + bowel sounds.    Genito-urinary: Deferred Neurologic: Alert & Oriented to person, place, and time.   Normal affect and insight.  Appropriate questions.  Moving all 4 extremities with gross sensory intact.  Hip flexors, knee flexors/extensors, and ankle flexors/extensors intact with no asymmetry.  Using a rolling walker for ambulation.   Targeted exam of the spine shows typical kyphotic curvature.  Reproducible pain at the thoracolumbar junction.    Mallampati Score:  2 Imaging: Dg Chest 2 View  Result Date: 03/27/2017 CLINICAL DATA:  Hypernatremia, UTI, increased confusion, back pain, hypertension, coronary artery disease EXAM: CHEST  2 VIEW COMPARISON:  10/16/2013 FINDINGS: LEFT subclavian sequential pacemaker leads project over RIGHT atrium and RIGHT ventricle. Enlargement of cardiac silhouette post CABG. Atherosclerotic calcification aorta. Mediastinal contours and pulmonary vascularity otherwise normal. Bibasilar atelectasis. Lungs otherwise clear. No infiltrate, pleural effusion or pneumothorax. Bones demineralized with multiple thoracolumbar compression fractures and prior vertebroplasty at a single level. IMPRESSION: Enlargement of cardiac silhouette post CABG and pacemaker. Bibasilar atelectasis. Osteoporosis with multiple thoracolumbar compression fractures. Electronically Signed   By: Lavonia Dana M.D.   On: 03/27/2017 14:42   Dg Abd 1 View  Result Date: 03/31/2017 CLINICAL DATA:  Abdominal distention, nausea and vomiting. EXAM: ABDOMEN - 1 VIEW COMPARISON:  CT abdomen and pelvis from same day. FINDINGS: Multiple dilated loops of small bowel are seen. Contrast is noted within the bilateral renal collecting systems and the decompressed bladder. Foley catheter in the bladder. IMPRESSION: Small bowel obstruction as seen on CT from same day. Electronically Signed   By: Titus Dubin M.D.   On: 03/31/2017 13:46   Ct Head Wo Contrast  Result Date: 03/27/2017 CLINICAL DATA:  Confusion. EXAM: CT HEAD WITHOUT  CONTRAST TECHNIQUE: Contiguous axial images were obtained from the base of the skull through the vertex without intravenous contrast. COMPARISON:  CT head dated August 14, 2015. FINDINGS: Brain: No evidence of acute infarction, hemorrhage, hydrocephalus, extra-axial collection or mass lesion/mass effect. Right frontal encephalomalacia, unchanged. Moderate age-related cerebral atrophy with compensatory dilatation of the ventricles. Periventricular white matter and corona radiata hypodensities favor chronic ischemic microvascular white matter disease. Vascular: Atherosclerotic vascular calcification of the carotid siphons. No hyperdense vessel Skull: Osteopenia.  No fracture or focal lesion. Sinuses/Orbits: The bilateral paranasal sinuses and mastoid air cells  are clear. Bilateral pseudophakia. Other: None. IMPRESSION: 1.  No acute intracranial abnormality. 2. Unchanged right frontal encephalomalacia and chronic ischemic microvascular white matter disease. Electronically Signed   By: Titus Dubin M.D.   On: 03/27/2017 15:12   Ct Abdomen Pelvis W Contrast  Result Date: 03/31/2017 CLINICAL DATA:  Abdominal distention. EXAM: CT ABDOMEN AND PELVIS WITH CONTRAST TECHNIQUE: Multidetector CT imaging of the abdomen and pelvis was performed using the standard protocol following bolus administration of intravenous contrast. CONTRAST:  76mL ISOVUE-300 IOPAMIDOL (ISOVUE-300) INJECTION 61% COMPARISON:  03/25/2017 FINDINGS: Lower chest: Small bilateral pleural effusions identified. Aortic atherosclerosis. Calcification in the RCA coronary artery noted. Hepatobiliary: No focal liver abnormality is seen. No gallstones, gallbladder wall thickening, or biliary dilatation. Pancreas: Unremarkable. No pancreatic ductal dilatation or surrounding inflammatory changes. Spleen: Normal in size without focal abnormality. Adrenals/Urinary Tract: The adrenal glands appear normal. Normal appearance of the kidneys. The urinary bladder is  partially collapsed around a Foley catheter balloon. Stomach/Bowel: Moderate distension of the stomach. Small bowel loops are dilated and there are small bowel air-fluid levels. The bowel loops measure up to 3.5 cm in diameter. Transition to decreased caliber small bowel loops identified within the left lower quadrant of the abdomen, image 57 of series 2. The ileum appears decreased in caliber. Numerous colonic diverticula noted without evidence for acute diverticulitis. No pathologic dilatation of the colon. Vascular/Lymphatic: Aortic atherosclerosis. No aneurysm. No enlarged upper abdominal or pelvic lymph nodes. No inguinal adenopathy. Reproductive: Status post hysterectomy. No adnexal masses. Other: No free fluid or fluid collections. Musculoskeletal: Multi level compression fractures are identified throughout the thoracic and lumbar spine including: T10, T12, L1, L3 and L4. IMPRESSION: 1. Examination is positive for small bowel obstruction. Transition point is identified within the left lower quadrant of the abdomen. 2.  Aortic Atherosclerosis (ICD10-I70.0). 3. Multiple thoracic and lumbar compression deformities as detailed on 03/25/2017 CT. Electronically Signed   By: Kerby Moors M.D.   On: 03/31/2017 11:47   Ct L-spine No Charge  Result Date: 03/25/2017 CLINICAL DATA:  Low back pain. History of multiple compression fractures. EXAM: CT LUMBAR SPINE WITHOUT CONTRAST TECHNIQUE: Multidetector CT imaging of the lumbar spine was performed without intravenous contrast administration. Multiplanar CT image reconstructions were also generated. COMPARISON:  CT scan 08/30/2015 FINDINGS: Segmentation: 5 lumbar type vertebral bodies. The last full intervertebral disc space is labeled L5-S1. This correlates with the prior CT scans. Alignment: Normal overall alignment. There is mild degenerative anterolisthesis of L4. Vertebrae: Remote severe compression fracture of L3 with vertebral plana appearance and stable  retropulsion of approximately 7 mm. Stable vertebral augmentation changes at L1 without complicating features. There is a new compression fracture T12 when compared the prior CT scan. This could be acute or subacute. Mild paraspinal edema is noted. Remote compression fracture of T10 without progressive findings. The L2 and L5 vertebral bodies appear normal in stable. Mild stable compression deformity of L4. The facets are normally aligned. Stable facet disease but no pars defects. Paraspinal and other soft tissues: No significant findings. Stable advanced atherosclerotic calcifications involving the aorta and branch vessel ostia. Disc levels: T11-12: Minimal retropulsion of T12 but no canal compromise. No spinal or foraminal stenosis. T12-L1: No disc protrusion, spinal or foraminal stenosis. Stable retropulsion of L1 but no significant canal stenosis. L1-2:  Mild bulging annulus but no spinal or foraminal stenosis. The L2-3: Mild bulging annulus with a shallow rim calcified disc protrusion on the right. There is mild right lateral recess and right  foraminal encroachment. Significant retropulsion of L3 with canal stenosis which is stable. L3-4: Annular bulge but no significant spinal or foraminal stenosis. L4-5: Bulging and uncovered disc and osteophytic ridging with mild spinal and lateral recess stenosis. No significant foraminal stenosis. L5-S1:  No significant spinal or foraminal stenosis. IMPRESSION: 1. Remote compression fractures of T10, L1, L3 and L4. Stable vertebral augmentation changes at L1. 2. New compression fracture of T12 since the prior study. This could be acute or subacute. No significant retropulsion or canal compromise. 3. Stable multilevel disc disease and facet disease without significant spinal or foraminal stenosis. Electronically Signed   By: Marijo Sanes M.D.   On: 03/25/2017 12:25   Dg Chest Portable 1 View  Result Date: 03/31/2017 CLINICAL DATA:  Status post NG tube placement. EXAM:  PORTABLE CHEST 1 VIEW COMPARISON:  PA and lateral chest 03/27/2017. FINDINGS: NG tube is in place with the tip in the distal esophagus. The tube should be advanced 11-12 cm for positioning of the side-port in the stomach. There is cardiomegaly without edema. Tiny left effusion is noted. There is some left basilar atelectasis. 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 IMPRESSION: NG tube tip is in the distal esophagus. The tube should be advanced 11-12 cm. Cardiomegaly without edema. Tiny left pleural effusion. Electronically Signed   By: Inge Rise M.D.   On: 03/31/2017 13:47   Dg Abd Portable 1v-small Bowel Obstruction Protocol-initial, 8 Hr Delay  Result Date: 04/01/2017 CLINICAL DATA:  Small bowel obstruction 8 hour delay EXAM: PORTABLE ABDOMEN - 1 VIEW COMPARISON:  03/31/2017 FINDINGS: Partially visualized cardiac pacing leads. Esophageal tube tip is in the left upper quadrant. Vertebroplasty changes. Decreased small bowel distention since the prior radiograph. Majority of the contrast is in the colon and rectum. IMPRESSION: Decreased small bowel distention since the prior radiograph. Majority of the contrast is in the colon and rectum Electronically Signed   By: Donavan Foil M.D.   On: 04/01/2017 02:58   Dg Abd Portable 1v-small Bowel Protocol-position Verification  Result Date: 03/31/2017 CLINICAL DATA:  83 y/o  F; nasogastric tube placement. EXAM: PORTABLE ABDOMEN - 1 VIEW COMPARISON:  03/31/2017 abdomen radiograph. FINDINGS: Persistent dilated small bowel. Contrast is retained within the bladder. Multiple compression deformities with above lumbar kyphoplasty are grossly stable. Median sternotomy wires and 2 pacemaker leads noted. Enteric tube tip projects over the gastric body. IMPRESSION: Enteric tube tip projects over gastric body. Stable dilated small bowel compatible with obstruction. Electronically Signed   By: Kristine Garbe M.D.   On: 03/31/2017 15:59    Ct Renal Stone Study  Result Date: 03/25/2017 CLINICAL DATA:  Lower abdominal pain and back pain currently being treated for UTI. EXAM: CT ABDOMEN AND PELVIS WITHOUT CONTRAST TECHNIQUE: Multidetector CT imaging of the abdomen and pelvis was performed following the standard protocol without IV contrast. COMPARISON:  Chest CT 10/25/2013 and thoracolumbar spine CT 08/30/2015 FINDINGS: Lower chest: Lung bases are unremarkable. Median sternotomy wires are present. Cardiac pacer leads are present. Atherosclerotic coronary artery disease post CABG. Hepatobiliary: Liver and biliary tree are within normal. Gallbladder is unremarkable. Pancreas: Within normal. Spleen: Within normal. Adrenals/Urinary Tract: Adrenal glands are normal. Kidneys normal in size without hydronephrosis or nephrolithiasis. Ureters and bladder are normal. Stomach/Bowel: Stomach and small bowel are within normal. Appendix is normal. Moderate diverticulosis throughout the colon most prominent over the sigmoid colon. Vascular/Lymphatic: Moderate calcified plaque over the abdominal aorta and iliac arteries. No adenopathy. Reproductive: Previous hysterectomy.  Other: No free fluid or focal inflammatory change. Musculoskeletal: Degenerate changes spine. Stable severe compression fracture of T10 with mild interval progression of moderate T12 compression fracture. Stable moderate compression fracture of L1 post kyphoplasty. Stable severe L3 compression fracture and stable moderate L4 compression fracture. Stable retropulsion of the posterior aspect of the L3 compression fracture with mild resulting canal stenosis unchanged. Subtle grade 1 anterolisthesis of L4 on L5 unchanged. IMPRESSION: No acute findings in the abdomen/pelvis. Significant diverticulosis throughout the colon without active inflammation. Numerous stable compression fractures of the thoracolumbar spine as described with slight interval progression of the T12 fracture. Stable grade 1  anterolisthesis of L4 on L5. Stable mild canal stenosis at the L3 level due to retropulsion of the posterior border of the associated compression fracture. Post CABG. Aortic Atherosclerosis (ICD10-I70.0). Electronically Signed   By: Marin Olp M.D.   On: 03/25/2017 12:08    Labs:  CBC:  Recent Labs  03/27/17 1517 03/31/17 1004 04/01/17 0531 04/02/17 0523  WBC 8.1 10.3 8.2 7.0  HGB 12.1 12.7 11.3* 11.2*  HCT 35.4* 37.3 33.3* 33.9*  PLT 199 249 235 227    COAGS:  Recent Labs  03/31/17 1911 04/01/17 0531  INR  --  1.20  APTT 33 92*    BMP:  Recent Labs  12/20/16 0851 03/25/17 0952 03/27/17 1517 03/31/17 1004 04/01/17 0531  NA 132* 130* 129* 131* 136  K 4.6 4.2 4.0 4.2 3.6  CL 94* 93* 94* 95* 101  CO2 25  --  24 25 25   GLUCOSE 85 101* 96 124* 89  BUN 22 22* 15 24* 27*  CALCIUM 9.3  --  8.6* 9.5 8.6*  CREATININE 1.03* 0.90 1.01* 1.02* 0.95  GFRNONAA 49*  --  48* 48* 52*  GFRAA 56*  --  56* 55* >60    LIVER FUNCTION TESTS:  Recent Labs  03/27/17 1517 04/01/17 0531  BILITOT 0.6 0.7  AST 24 32  ALT 12* 23  ALKPHOS 53 52  PROT 6.7 5.9*  ALBUMIN 4.0 3.4*    TUMOR MARKERS: No results for input(s): AFPTM, CEA, CA199, CHROMGRNA in the last 8760 hours.  Assessment and Plan:  Donna Ortega is an 83 yo female with a new superior endplate fracture of T65, with non-remitting and lifestyle limiting pain.  Although we have no MRI imaging, there is clearly a fracture on recent CT with associated new back pain.    I do think she is a candidate for vertebral augmentation, which I shared with the family, with the discussion that MRI is typically used for confirmation of edema at the site.  They would prefer to defer the MRI.   We also discussed the T10 level, which they inquired about given the near vertebra plana configuration.  I told them appears to be chronic, and we would usually not prophylactic/empirically treat such a finding without confirmatory MRI.  They  understand.    After our discussion, they would like to proceed with vertebral augmentation of T12, ASAP, and either myself or any of my partners are acceptable.   Plan: - Proceed with T12 vertebral augmentation ASAP at [facility] or [facility].   - Will need to hold blood thinners appropriately before proceeding  Thank you for this interesting consult.  I greatly enjoyed meeting Donna Ortega and look forward to participating in their care.  A copy of this report was sent to the requesting provider on this date.  Electronically Signed: Corrie Mckusick 04/04/2017, 1:14 PM  I spent a total of  40 Minutes   in face to face in clinical consultation, greater than 50% of which was counseling/coordinating care for T12 compression fracture, life-style limiting pain, possible vertebral augmentation.

## 2017-04-05 ENCOUNTER — Telehealth (HOSPITAL_COMMUNITY): Payer: Self-pay

## 2017-04-05 ENCOUNTER — Other Ambulatory Visit (HOSPITAL_COMMUNITY): Payer: Self-pay | Admitting: Interventional Radiology

## 2017-04-05 DIAGNOSIS — S22080A Wedge compression fracture of T11-T12 vertebra, initial encounter for closed fracture: Secondary | ICD-10-CM

## 2017-04-05 NOTE — Telephone Encounter (Signed)
Left message for son to return call to schedule kyphoplasty. AW

## 2017-04-06 ENCOUNTER — Other Ambulatory Visit (HOSPITAL_COMMUNITY): Payer: Self-pay | Admitting: Interventional Radiology

## 2017-04-06 DIAGNOSIS — S22080A Wedge compression fracture of T11-T12 vertebra, initial encounter for closed fracture: Secondary | ICD-10-CM

## 2017-04-07 ENCOUNTER — Ambulatory Visit (HOSPITAL_COMMUNITY): Payer: Medicare HMO

## 2017-04-10 ENCOUNTER — Other Ambulatory Visit: Payer: Self-pay | Admitting: Radiology

## 2017-04-11 ENCOUNTER — Encounter (HOSPITAL_COMMUNITY): Payer: Self-pay

## 2017-04-11 ENCOUNTER — Ambulatory Visit (HOSPITAL_COMMUNITY)
Admission: RE | Admit: 2017-04-11 | Discharge: 2017-04-11 | Disposition: A | Discharge status: Home / Self Care | Payer: Medicare HMO | Source: Ambulatory Visit | Attending: Interventional Radiology | Admitting: Interventional Radiology

## 2017-04-11 DIAGNOSIS — Z7901 Long term (current) use of anticoagulants: Secondary | ICD-10-CM | POA: Insufficient documentation

## 2017-04-11 DIAGNOSIS — I2510 Atherosclerotic heart disease of native coronary artery without angina pectoris: Secondary | ICD-10-CM | POA: Diagnosis not present

## 2017-04-11 DIAGNOSIS — I10 Essential (primary) hypertension: Secondary | ICD-10-CM | POA: Insufficient documentation

## 2017-04-11 DIAGNOSIS — S22080A Wedge compression fracture of T11-T12 vertebra, initial encounter for closed fracture: Secondary | ICD-10-CM

## 2017-04-11 DIAGNOSIS — Z950 Presence of cardiac pacemaker: Secondary | ICD-10-CM | POA: Diagnosis not present

## 2017-04-11 DIAGNOSIS — M4690 Unspecified inflammatory spondylopathy, site unspecified: Secondary | ICD-10-CM | POA: Diagnosis not present

## 2017-04-11 DIAGNOSIS — E785 Hyperlipidemia, unspecified: Secondary | ICD-10-CM | POA: Insufficient documentation

## 2017-04-11 DIAGNOSIS — M545 Low back pain: Secondary | ICD-10-CM | POA: Diagnosis not present

## 2017-04-11 DIAGNOSIS — M4854XA Collapsed vertebra, not elsewhere classified, thoracic region, initial encounter for fracture: Secondary | ICD-10-CM | POA: Insufficient documentation

## 2017-04-11 DIAGNOSIS — Z951 Presence of aortocoronary bypass graft: Secondary | ICD-10-CM | POA: Insufficient documentation

## 2017-04-11 HISTORY — PX: IR KYPHO THORACIC WITH BONE BIOPSY: IMG5518

## 2017-04-11 LAB — BASIC METABOLIC PANEL
Anion gap: 11 (ref 5–15)
BUN: 11 mg/dL (ref 6–20)
CHLORIDE: 96 mmol/L — AB (ref 101–111)
CO2: 23 mmol/L (ref 22–32)
CREATININE: 0.85 mg/dL (ref 0.44–1.00)
Calcium: 9.1 mg/dL (ref 8.9–10.3)
GFR calc non Af Amer: 59 mL/min — ABNORMAL LOW (ref >60)
Glucose, Bld: 100 mg/dL — ABNORMAL HIGH (ref 65–99)
Potassium: 4.2 mmol/L (ref 3.5–5.1)
Sodium: 130 mmol/L — ABNORMAL LOW (ref 135–145)

## 2017-04-11 LAB — PROTIME-INR
INR: 1.02
PROTHROMBIN TIME: 13.4 s (ref 11.4–15.2)

## 2017-04-11 LAB — CBC
HCT: 37.6 % (ref 36.0–46.0)
Hemoglobin: 13.1 g/dL (ref 12.0–15.0)
MCH: 31.6 pg (ref 26.0–34.0)
MCHC: 34.8 g/dL (ref 30.0–36.0)
MCV: 90.6 fL (ref 78.0–100.0)
PLATELETS: 259 10*3/uL (ref 150–400)
RBC: 4.15 MIL/uL (ref 3.87–5.11)
RDW: 13.2 % (ref 11.5–15.5)
WBC: 6.5 10*3/uL (ref 4.0–10.5)

## 2017-04-11 LAB — APTT: APTT: 30 s (ref 24–36)

## 2017-04-11 MED ORDER — MIDAZOLAM HCL 2 MG/2ML IJ SOLN
INTRAMUSCULAR | Status: AC
  Filled 2017-04-11: qty 2

## 2017-04-11 MED ORDER — SODIUM CHLORIDE 0.9 % IV SOLN: INTRAVENOUS | Status: AC

## 2017-04-11 MED ORDER — TOBRAMYCIN SULFATE 1.2 G IJ SOLR
INTRAMUSCULAR | Status: AC
  Filled 2017-04-11: qty 1.2

## 2017-04-11 MED ORDER — BUPIVACAINE HCL (PF) 0.5 % IJ SOLN
INTRAMUSCULAR | Status: AC
  Filled 2017-04-11: qty 30

## 2017-04-11 MED ORDER — SODIUM CHLORIDE 0.9 % IV SOLN
INTRAVENOUS | Status: DC
  Administered 2017-04-11: 09:00:00 via INTRAVENOUS

## 2017-04-11 MED ORDER — IOPAMIDOL (ISOVUE-300) INJECTION 61%
INTRAVENOUS | Status: AC
  Administered 2017-04-11: 10 mL
  Filled 2017-04-11: qty 50

## 2017-04-11 MED ORDER — FENTANYL CITRATE (PF) 100 MCG/2ML IJ SOLN
INTRAMUSCULAR | Status: AC
  Filled 2017-04-11: qty 2

## 2017-04-11 MED ORDER — CEFAZOLIN SODIUM-DEXTROSE 2-4 GM/100ML-% IV SOLN
INTRAVENOUS | Status: AC
  Filled 2017-04-11: qty 100

## 2017-04-11 MED ORDER — BUPIVACAINE HCL (PF) 0.5 % IJ SOLN
INTRAMUSCULAR | Status: AC | PRN
  Administered 2017-04-11: 20 mL

## 2017-04-11 MED ORDER — FENTANYL CITRATE (PF) 100 MCG/2ML IJ SOLN
INTRAMUSCULAR | Status: AC | PRN
  Administered 2017-04-11 (×2): 25 ug via INTRAVENOUS

## 2017-04-11 MED ORDER — TOBRAMYCIN SULFATE 1.2 G IJ SOLR
INTRAMUSCULAR | Status: AC | PRN
  Administered 2017-04-11: .001 g via TOPICAL

## 2017-04-11 MED ORDER — MIDAZOLAM HCL 2 MG/2ML IJ SOLN
INTRAMUSCULAR | Status: AC | PRN
  Administered 2017-04-11 (×2): 1 mg via INTRAVENOUS

## 2017-04-11 MED ORDER — CEFAZOLIN SODIUM-DEXTROSE 2-4 GM/100ML-% IV SOLN
2.0000 g | INTRAVENOUS | Status: AC
  Administered 2017-04-11: 2 g via INTRAVENOUS

## 2017-04-11 NOTE — Discharge Instructions (Signed)
1. No stooping,bending or lifting more than 10 lbs for 2 weeks. 2.Use walker to ambulate for 2 weeks. 3.RTC PRN 2 weeks.    RESUME ELIQUIS TOMORROW WITH EVENING DOSE   KYPHOPLASTY/VERTEBROPLASTY DISCHARGE INSTRUCTIONS                      Continue your pain medications as prescribed as needed.  Over the next 3-5 days, decrease your pain medication as tolerated.  Over the counter medications (i.e. Tylenol, ibuprofen, and aleve) may be substituted once severe/moderate pain symptoms have subsided.   Wound Care: - Bandages may be removed the day following your procedure.  You may get your incision wet once bandages are removed.  Bandaids may be used to cover the incisions until scab formation.  Topical ointments are optional.  - If you develop a fever greater than 101 degrees, have increased skin redness at the incision sites or pus-like oozing from incisions occurring within 1 week of the procedure, contact radiology at [phone] or [phone].  - Ice pack to back for 15-20 minutes 2-3 time per day for first 2-3 days post procedure.  The ice will expedite muscle healing and help with the pain from the incisions.   Activity: - Bedrest today with limited activity for 24 hours post procedure.  - No driving for 48 hours.  - Increase your activity as tolerated after bedrest (with assistance if necessary).  - Refrain from any strenuous activity or heavy lifting (greater than 10 lbs.).   Follow up: - Contact radiology at [phone] or [phone] if any questions/concerns.  - A physician assistant from radiology will contact you in approximately 1 week.  - If a biopsy was performed at the time of your procedure, your referring physician should receive the results in usually 2-3 days.

## 2017-04-11 NOTE — Sedation Documentation (Signed)
Threasa Beards, RN given information to share with the pts. family. Dr. Estanislado Pandy said to restart prescribed Eliquist tomorrow, as scheduled.

## 2017-04-11 NOTE — H&P (Signed)
Chief Complaint: T12 compression fracture  Referring Physician:Dr. Latanya Maudlin  Supervising Physician: Luanne Bras  Patient Status: [facility] - Out-pt  HPI: KARENNA Ortega is a 83 y.o. female who was seen in the office by Dr. Earleen Newport on 04-04-17 for back pain referred from Dr. Charlestine Night office.   "Her son and her tell me that although she did not have a trauma or a fall, and there was no event that she remembers that started the pain, she clearly remembers new terrible back pain starting about 2 weeks ago.    She describes the pain as a deep pain, of at least 8-9/10 intensity, which is present at most times.  She has been taking a combination of over the counter medication as well as prescription medication.  She was previously taking oxycodone, but developed obstipation and was admitted with GI obstructive symptoms recently.  Her discharge was 04/02/2017.  Now she is only taking occasional tramadol for break-though pain, but continues to have symptoms of back pain.   She is unable to participate in her activities of daily living like previously.  She lives with her family in an in-law suite, and has difficulty with stairs, ambulating without her rolling walker (which is new), and other activities.    She has a history of multiple compression fractures.  She had L1 fracture treated with vertebral augmentation about 2-3 years ago.  She remembers having good relief from that treatment.    She has prior imaging of her lumbar spine performed January of 2017 which shows irregular superior endplate of B63, but no fracture line.  Her newest CT imaging shows a new fracture line of the superior endplate of S93, with increasing height loss compared to 18 months ago.   She has not had MRI."  She presents today for this procedure with Dr. Estanislado Pandy.  She has resolved her pSBO and is feeling better from this. She denies any urinary symptoms, CP, SOB, fevers, or chills.   Past Medical History:    Past Medical History:  Diagnosis Date  . Arthritis    osteo   in back  . Coronary artery disease 2007   3 vessel CABG with LIMA-LAD, SVG to diag, and SVG - Ramus  . Diverticulitis   . GI bleeding 09/09/2013  . Hearing loss of left ear 07/2013  . History of atrial fibrillation; review of all ECG are most consistent with atrial tachycardia    had rash with amiodarone; no anticoagulation due to GI bleed in December 2012  . History of GI bleed Dec 2012  . History of hyperkalemia   . Hyperlipidemia   . Hypertension   . Hyponatremia   . Pacemaker-Medtronic 02/09/2012  . Palpitations     Past Surgical History:  Past Surgical History:  Procedure Laterality Date  . ABDOMINAL HYSTERECTOMY    . COLONOSCOPY N/A 09/12/2013   Procedure: COLONOSCOPY;  Surgeon: Wonda Horner, MD;  Location: [facility] ENDOSCOPY;  Service: Endoscopy;  Laterality: N/A;  . CORONARY ARTERY BYPASS GRAFT  Feb 2007   Dr. Cyndia Bent;   . ESOPHAGOGASTRODUODENOSCOPY  08/04/2011   Procedure: ESOPHAGOGASTRODUODENOSCOPY (EGD);  Surgeon: Cleotis Nipper, MD;  Location: [facility] ENDOSCOPY;  Service: Endoscopy;  Laterality: N/A;  do at bedside  . ESOPHAGOGASTRODUODENOSCOPY N/A 09/11/2013   Procedure: ESOPHAGOGASTRODUODENOSCOPY (EGD);  Surgeon: Jeryl Columbia, MD;  Location: [facility] ENDOSCOPY;  Service: Endoscopy;  Laterality: N/A;  . MAZE    . pace maker    . PARTIAL HYSTERECTOMY    .  PERMANENT PACEMAKER INSERTION N/A 02/08/2012   Procedure: PERMANENT PACEMAKER INSERTION;  Surgeon: Deboraha Sprang, MD;  Location: [facility] CATH LAB;  Service: Cardiovascular;  Laterality: N/A;    Family History:  Family History  Problem Relation Age of Onset  . Heart failure Father   . Kidney failure Father   . Bone cancer Brother   . Kidney failure Sister   . Colon cancer Sister   . Cancer Sister     Social History:  reports that she has never smoked. She has never used smokeless tobacco. She reports that she does not drink alcohol or use drugs.  Allergies:   Allergies  Allergen Reactions  . Amiodarone Hives    ALL OVER BODY HIVES/ ITCH   . Aspirin Other (See Comments)    Gi bleed   . Lipitor [Atorvastatin Calcium] Other (See Comments)    MUSCLE ACHES  . Nsaids Other (See Comments)    GI bleed  . Tolmetin Other (See Comments)    GI bleed  . Naproxen Other (See Comments)  . Niacin Rash  . Niaspan [Niacin Er] Rash    Medications: Medications reviewed in epic.  Eliquis was last given Sunday am.  Please HPI for pertinent positives, otherwise complete 10 system ROS negative.  Mallampati Score: MD Evaluation Airway: WNL Heart: WNL Abdomen: WNL Chest/ Lungs: WNL ASA  Classification: 3 Mallampati/Airway Score: Two  Physical Exam: BP 140/77   Pulse 95   Temp 98.1 F (36.7 C) (Oral)   Ht 5' 4"  (1.626 m)   Wt 140 lb (63.5 kg)   SpO2 98%   BMI 24.03 kg/m  Body mass index is 24.03 kg/m. General: pleasant, WD, WN, elderly white female who is laying in bed in NAD HEENT: head is normocephalic, atraumatic.  Sclera are noninjected.  PERRL.  Ears and nose without any masses or lesions.  Mouth is pink and moist Heart: regular, rate, and rhythm.  Normal s1,s2. No obvious murmurs, gallops, or rubs noted.  Palpable radial pulses bilaterally Lungs: CTAB, no wheezes, rhonchi, or rales noted.  Respiratory effort nonlabored Abd: soft, NT, ND, +BS, no masses, hernias, or organomegaly Psych: A&Ox3 with an appropriate affect.   Labs: Results for orders placed or performed during the hospital encounter of 04/11/17 (from the past 48 hour(s))  APTT     Status: None   Collection Time: 04/11/17  8:32 AM  Result Value Ref Range   aPTT 30 24 - 36 seconds  Basic metabolic panel     Status: Abnormal   Collection Time: 04/11/17  8:32 AM  Result Value Ref Range   Sodium 130 (L) 135 - 145 mmol/L   Potassium 4.2 3.5 - 5.1 mmol/L    Comment: HEMOLYSIS AT THIS LEVEL MAY AFFECT RESULT   Chloride 96 (L) 101 - 111 mmol/L   CO2 23 22 - 32 mmol/L    Glucose, Bld 100 (H) 65 - 99 mg/dL   BUN 11 6 - 20 mg/dL   Creatinine, Ser 0.85 0.44 - 1.00 mg/dL   Calcium 9.1 8.9 - 10.3 mg/dL   GFR calc non Af Amer 59 (L) >60 mL/min   GFR calc Af Amer >60 >60 mL/min    Comment: (NOTE) The eGFR has been calculated using the CKD EPI equation. This calculation has not been validated in all clinical situations. eGFR's persistently <60 mL/min signify possible Chronic Kidney Disease.    Anion gap 11 5 - 15  CBC     Status: None   Collection  Time: 04/11/17  8:32 AM  Result Value Ref Range   WBC 6.5 4.0 - 10.5 K/uL   RBC 4.15 3.87 - 5.11 MIL/uL   Hemoglobin 13.1 12.0 - 15.0 g/dL   HCT 37.6 36.0 - 46.0 %   MCV 90.6 78.0 - 100.0 fL   MCH 31.6 26.0 - 34.0 pg   MCHC 34.8 30.0 - 36.0 g/dL   RDW 13.2 11.5 - 15.5 %   Platelets 259 150 - 400 K/uL  Protime-INR     Status: None   Collection Time: 04/11/17  8:32 AM  Result Value Ref Range   Prothrombin Time 13.4 11.4 - 15.2 seconds   INR 1.02     Imaging: No results found.  Assessment/Plan 1. T12 compression fracture  We will plan to proceed today with a KP vs VP of this fracture.  Because she did not get an MRI, other fractures contributing to her pain can not be ruled out.  We have discussed this so she is aware that proceeding may correct her pain if the T12 fx is her only fx; however, if it is not her only one, then this may not resolve all of her pain and she will then need to get an MRI.  She and her son understand this.  She would like to proceed.   Risks and benefits discussed with the patient including, but not limited to education regarding the natural healing process of compression fractures without intervention, bleeding, infection, cement migration which may cause spinal cord damage, paralysis, pulmonary embolism or even death. All of the patient's questions were answered, patient is agreeable to proceed. Consent signed and in chart.   Thank you for this interesting consult.  I greatly  enjoyed meeting VYLET MAFFIA and look forward to participating in their care.  A copy of this report was sent to the requesting provider on this date.  Electronically Signed: Henreitta Cea 04/11/2017, 9:54 AM   I spent a total of    25 Minutes in face to face in clinical consultation, greater than 50% of which was counseling/coordinating care for T12 compression fracture

## 2017-04-11 NOTE — Progress Notes (Signed)
Per Dr Estanislado Pandy client and her son notified to resume eliquis tomorrow with evening dose

## 2017-04-11 NOTE — Procedures (Signed)
S/p T 12 balloon KP

## 2017-04-12 ENCOUNTER — Encounter (HOSPITAL_COMMUNITY): Payer: Self-pay | Admitting: Interventional Radiology

## 2017-05-04 ENCOUNTER — Telehealth (HOSPITAL_COMMUNITY): Payer: Self-pay

## 2017-05-04 NOTE — Telephone Encounter (Signed)
Pt's son returned call. Pt is back to normal and feeling better. He will give [place] a call if pt starts to have any back pain. AW

## 2017-05-04 NOTE — Telephone Encounter (Signed)
Left message for son to return call. AW

## 2017-05-05 DIAGNOSIS — N183 Chronic kidney disease, stage 3 (moderate): Secondary | ICD-10-CM | POA: Diagnosis not present

## 2017-05-05 DIAGNOSIS — I4891 Unspecified atrial fibrillation: Secondary | ICD-10-CM | POA: Diagnosis not present

## 2017-05-05 DIAGNOSIS — I4892 Unspecified atrial flutter: Secondary | ICD-10-CM | POA: Diagnosis not present

## 2017-05-05 DIAGNOSIS — E785 Hyperlipidemia, unspecified: Secondary | ICD-10-CM | POA: Diagnosis not present

## 2017-05-05 DIAGNOSIS — I129 Hypertensive chronic kidney disease with stage 1 through stage 4 chronic kidney disease, or unspecified chronic kidney disease: Secondary | ICD-10-CM | POA: Diagnosis not present

## 2017-05-05 DIAGNOSIS — M8088XD Other osteoporosis with current pathological fracture, vertebra(e), subsequent encounter for fracture with routine healing: Secondary | ICD-10-CM | POA: Diagnosis not present

## 2017-05-05 DIAGNOSIS — Z7901 Long term (current) use of anticoagulants: Secondary | ICD-10-CM | POA: Diagnosis not present

## 2017-05-05 DIAGNOSIS — M4690 Unspecified inflammatory spondylopathy, site unspecified: Secondary | ICD-10-CM | POA: Diagnosis not present

## 2017-05-05 DIAGNOSIS — I495 Sick sinus syndrome: Secondary | ICD-10-CM | POA: Diagnosis not present

## 2017-05-05 DIAGNOSIS — I2510 Atherosclerotic heart disease of native coronary artery without angina pectoris: Secondary | ICD-10-CM | POA: Diagnosis not present

## 2017-05-13 DIAGNOSIS — R69 Illness, unspecified: Secondary | ICD-10-CM | POA: Diagnosis not present

## 2017-05-29 ENCOUNTER — Encounter: Payer: Self-pay | Admitting: Interventional Radiology

## 2017-06-05 DIAGNOSIS — I10 Essential (primary) hypertension: Secondary | ICD-10-CM | POA: Diagnosis not present

## 2017-06-05 DIAGNOSIS — E559 Vitamin D deficiency, unspecified: Secondary | ICD-10-CM | POA: Diagnosis not present

## 2017-06-05 DIAGNOSIS — G3184 Mild cognitive impairment, so stated: Secondary | ICD-10-CM | POA: Diagnosis not present

## 2017-06-05 DIAGNOSIS — I480 Paroxysmal atrial fibrillation: Secondary | ICD-10-CM | POA: Diagnosis not present

## 2017-06-05 DIAGNOSIS — M4850XD Collapsed vertebra, not elsewhere classified, site unspecified, subsequent encounter for fracture with routine healing: Secondary | ICD-10-CM | POA: Diagnosis not present

## 2017-06-06 ENCOUNTER — Telehealth: Payer: Self-pay | Admitting: Cardiovascular Disease

## 2017-06-06 NOTE — Telephone Encounter (Signed)
New message     1. Has your device fired? No   2. Is you device beeping? no  3. Are you experiencing draining or swelling at device site? No   4. Are you calling to see if we received your device transmission? yes  5. Have you passed out? NO    Please route to [place]

## 2017-06-07 ENCOUNTER — Telehealth: Payer: Self-pay | Admitting: Internal Medicine

## 2017-06-07 ENCOUNTER — Ambulatory Visit (INDEPENDENT_AMBULATORY_CARE_PROVIDER_SITE_OTHER): Payer: Medicare HMO | Admitting: *Deleted

## 2017-06-07 ENCOUNTER — Telehealth: Payer: Self-pay | Admitting: Cardiology

## 2017-06-07 DIAGNOSIS — I495 Sick sinus syndrome: Secondary | ICD-10-CM | POA: Diagnosis not present

## 2017-06-07 NOTE — Telephone Encounter (Signed)
LMOVM reminding pt to send remote transmission.   

## 2017-06-07 NOTE — Telephone Encounter (Signed)
F/u message   Pt returning device call. Please call back to discuss

## 2017-06-07 NOTE — Telephone Encounter (Signed)
LM informing patient that her remote was received. Encouraged patient to call back if she has any additional questions. Device clinic phone number provided.

## 2017-06-08 LAB — CUP PACEART REMOTE DEVICE CHECK
Brady Statistic AP VP Percent: 0.02 %
Brady Statistic AP VS Percent: 1.26 %
Brady Statistic AS VP Percent: 0.04 %
Brady Statistic RA Percent Paced: 1.24 %
Brady Statistic RV Percent Paced: 0.06 %
Date Time Interrogation Session: 20181023163707
Implantable Lead Location: 753859
Implantable Lead Location: 753860
Lead Channel Impedance Value: 400 Ohm
Lead Channel Sensing Intrinsic Amplitude: 7.284 mV
Lead Channel Setting Pacing Amplitude: 2 V
Lead Channel Setting Pacing Amplitude: 2.5 V
Lead Channel Setting Pacing Pulse Width: 0.4 ms
Lead Channel Setting Sensing Sensitivity: 0.9 mV
MDC IDC LEAD IMPLANT DT: 20130626
MDC IDC LEAD IMPLANT DT: 20130626
MDC IDC MSMT BATTERY VOLTAGE: 2.99 V
MDC IDC MSMT LEADCHNL RA SENSING INTR AMPL: 0.806 mV
MDC IDC MSMT LEADCHNL RV IMPEDANCE VALUE: 392 Ohm
MDC IDC PG IMPLANT DT: 20130626
MDC IDC STAT BRADY AS VS PERCENT: 98.67 %

## 2017-06-08 NOTE — Telephone Encounter (Signed)
LMOM. Transmission received 06/06/17. No need to call back.

## 2017-06-08 NOTE — Progress Notes (Signed)
Remote pacemaker transmission.   

## 2017-06-09 ENCOUNTER — Encounter: Payer: Self-pay | Admitting: Cardiology

## 2017-06-27 DIAGNOSIS — R69 Illness, unspecified: Secondary | ICD-10-CM | POA: Diagnosis not present

## 2017-06-29 ENCOUNTER — Other Ambulatory Visit: Payer: Self-pay | Admitting: Cardiovascular Disease

## 2017-06-29 ENCOUNTER — Ambulatory Visit: Payer: Medicare HMO | Admitting: Cardiovascular Disease

## 2017-07-05 ENCOUNTER — Ambulatory Visit: Payer: Medicare HMO | Admitting: Cardiovascular Disease

## 2017-07-05 ENCOUNTER — Encounter: Payer: Self-pay | Admitting: Cardiovascular Disease

## 2017-07-05 ENCOUNTER — Encounter (INDEPENDENT_AMBULATORY_CARE_PROVIDER_SITE_OTHER): Payer: Self-pay

## 2017-07-05 VITALS — BP 120/70 | HR 80 | Ht 64.0 in | Wt 134.8 lb

## 2017-07-05 DIAGNOSIS — I10 Essential (primary) hypertension: Secondary | ICD-10-CM

## 2017-07-05 DIAGNOSIS — I2510 Atherosclerotic heart disease of native coronary artery without angina pectoris: Secondary | ICD-10-CM

## 2017-07-05 DIAGNOSIS — E782 Mixed hyperlipidemia: Secondary | ICD-10-CM | POA: Diagnosis not present

## 2017-07-05 DIAGNOSIS — I480 Paroxysmal atrial fibrillation: Secondary | ICD-10-CM

## 2017-07-05 NOTE — Progress Notes (Signed)
Donna Ortega Date of Birth  [birth year] [facility]      [address]    [address]    [place], [place]  [address]      Problems:  1. CAD - s/p CABG 2007, 2. PACs 3. Drug Rash - amiodarone 4. GI Bleed 5. Anemia 6. hyponatremia  History of Present Illness:  Donna Ortega is an 83 year old female with a history of coronary artery disease and atrial fibrillation. She was admitted to the hospital in December with a GI bleed, anemia, and rapid atrial fibrillation. She was discharged with the additional medications of amiodarone and Cardizem CD.  She was seen in mid January and was doing fairly well.  She stopped her Amiodarone due to drug rash.  She still has generalized fatigue and generalized weakness. She wonders if it may be due to the Lasix and potassium.  April 17, 2012 She has been more fatigued recently.  She played golf this past weekend and still is not over the exertion.  Dec. 3, 2013 :  She is doing well.  Dr. Caryl Comes stopped her Pradaxa due to GI bleed.    She is doing very well.   January 15, 2013:  Donna Ortega is doing OK.  She does not have as much energy as she would like.   She has continued to have problems with hyponatremia.  She is on maxzide which is likely exacerbating this.  No CP, no dyspnea.    Dec. 1, 2014:  Donna Ortega is doing well.  No CP or dyspnea.  Rhythm is stable.   Her Hb was a bit low when last checked by her medical doctor.  Has not started on the iron tabs.   No CP, not playing golf any more because of back pain ( s/p kyphoplasty)  .  She is still walking around the block on occasion.   She is now in her own apartment.   Sept. 9, 2015:  Donna Ortega is doing well.  She is not having any problems   March 02, 2015: No CP , no dyspnea.   Feb. 13, 2017:  Fell 3 weeks ago .  Now is using a cane for stability  She has occasional episodes of atrial fib - last 5 minutes or so.   Is on Eliquis 2.5 BID  Has had some bladder issues   Oct. 6, 2017:  Donna Ortega is  seen back for her CAD and PAF  Has some leg swelling .  Was on Maxzide - this is off her list - perhaps because of hypotension . Lisinopril was stopped at that time    November 29, 2016:  Doing well, no angina  Knows that she is starting to forget things on occasion Has been having some swelling in her feet and ankles.  BP has been a bit higher She avoids salty foods.   Nov. 21, 2018:  Doing well.  No CP or dyspnea Had kyphoplasty this past summer.  Very active.    Still driving     Current Outpatient Medications on File Prior to Visit  Medication Sig Dispense Refill  . acetaminophen (TYLENOL) 500 MG tablet Take 1,000 mg by mouth every 4 (four) hours as needed.    Marland Kitchen alendronate (FOSAMAX) 70 MG tablet Take 70 mg by mouth once a week. Take on Thursday with a full glass of water on an empty stomach.    . Calcium Carbonate-Vitamin D (CALTRATE 600+D PO) Take 1 tablet by mouth  daily.     . diltiazem (CARDIZEM CD) 240 MG 24 hr capsule TAKE 1 CAPSULE (240 MG TOTAL) BY MOUTH DAILY. 30 capsule 5  . diphenhydramine-acetaminophen (TYLENOL PM) 25-500 MG TABS tablet Take 2 tablets by mouth at bedtime.    Marland Kitchen ELIQUIS 2.5 MG TABS tablet TAKE 1 TABLET BY MOUTH TWICE A DAY 180 tablet 2  . metoprolol tartrate (LOPRESSOR) 25 MG tablet Take 1 tablet (25 mg total) by mouth 2 (two) times daily. 180 tablet 3  . morphine (MSIR) 15 MG tablet Take 1 tablet (15 mg total) by mouth every 4 (four) hours as needed for severe pain. 7 tablet 0  . Multiple Vitamins-Minerals (CENTRUM SILVER ADULT 50+ PO) Take 1 tablet by mouth daily.    . pantoprazole (PROTONIX) 40 MG tablet TAKE 1 TABLET (40 MG TOTAL) BY MOUTH DAILY. 90 tablet 1  . pravastatin (PRAVACHOL) 80 MG tablet TAKE 1 TABLET (80 MG TOTAL) BY MOUTH DAILY. 90 tablet 0   No current facility-administered medications on file prior to visit.     Allergies  Allergen Reactions  . Amiodarone Hives    ALL OVER BODY HIVES/ ITCH   . Aspirin Other (See Comments)    Gi  bleed   . Lipitor [Atorvastatin Calcium] Other (See Comments)    MUSCLE ACHES  . Nsaids Other (See Comments)    GI bleed  . Tolmetin Other (See Comments)    GI bleed  . Naproxen Other (See Comments)  . Niacin Rash  . Niaspan [Niacin Er] Rash    Past Medical History:  Diagnosis Date  . Arthritis    osteo   in back  . Coronary artery disease 2007   3 vessel CABG with LIMA-LAD, SVG to diag, and SVG - Ramus  . Diverticulitis   . GI bleeding 09/09/2013  . Hearing loss of left ear 07/2013  . History of atrial fibrillation; review of all ECG are most consistent with atrial tachycardia    had rash with amiodarone; no anticoagulation due to GI bleed in December 2012  . History of GI bleed Dec 2012  . History of hyperkalemia   . Hyperlipidemia   . Hypertension   . Hyponatremia   . Pacemaker-Medtronic 02/09/2012  . Palpitations     Past Surgical History:  Procedure Laterality Date  . ABDOMINAL HYSTERECTOMY    . COLONOSCOPY N/A 09/12/2013   Procedure: COLONOSCOPY;  Surgeon: Wonda Horner, MD;  Location: [facility] ENDOSCOPY;  Service: Endoscopy;  Laterality: N/A;  . CORONARY ARTERY BYPASS GRAFT  Feb 2007   Dr. Cyndia Bent;   . ESOPHAGOGASTRODUODENOSCOPY  08/04/2011   Procedure: ESOPHAGOGASTRODUODENOSCOPY (EGD);  Surgeon: Cleotis Nipper, MD;  Location: [facility] ENDOSCOPY;  Service: Endoscopy;  Laterality: N/A;  do at bedside  . ESOPHAGOGASTRODUODENOSCOPY N/A 09/11/2013   Procedure: ESOPHAGOGASTRODUODENOSCOPY (EGD);  Surgeon: Jeryl Columbia, MD;  Location: [facility] ENDOSCOPY;  Service: Endoscopy;  Laterality: N/A;  . IR KYPHO THORACIC WITH BONE BIOPSY  04/11/2017  . IR RADIOLOGIST EVAL & MGMT  04/04/2017  . MAZE    . pace maker    . PARTIAL HYSTERECTOMY    . PERMANENT PACEMAKER INSERTION N/A 02/08/2012   Procedure: PERMANENT PACEMAKER INSERTION;  Surgeon: Deboraha Sprang, MD;  Location: [facility] CATH LAB;  Service: Cardiovascular;  Laterality: N/A;    Social History   Tobacco Use  Smoking Status Never Smoker    Smokeless Tobacco Never Used    Social History   Substance and Sexual Activity  Alcohol Use No  Family History  Problem Relation Age of Onset  . Heart failure Father   . Kidney failure Father   . Bone cancer Brother   . Kidney failure Sister   . Colon cancer Sister   . Cancer Sister     Reviw of Systems:  Reviewed in the HPI.  All other systems are negative.   Physical Exam: Blood pressure 120/70, pulse 80, height 5\' 4"  (1.626 m), weight 134 lb 12.8 oz (61.1 kg), SpO2 94 %.  GEN:  Well nourished, well developed in no acute distress HEENT: Normal NECK: No JVD; No carotid bruits LYMPHATICS: No lymphadenopathy CARDIAC: RR, no murmurs, rubs, gallops RESPIRATORY:  Clear to auscultation without rales, wheezing or rhonchi  ABDOMEN: Soft, non-tender, non-distended MUSCULOSKELETAL:  No edema; No deformity  SKIN: Warm and dry NEUROLOGIC:  Alert and oriented x 3  ECG:  Assessment / Plan:   1. CAD - s/p CABG 2007,   -   Doing well.  No angina   2. Atrial fib:    Remains in NSR  Was on amiodarone but developed a drug rash   4. GI Bleed  -    No further signs of bleeding   5. Anemia -    6. Hyponatremia-  followed by her primary MD   7. Hyperlipidemia -  Check lipids today     8. HTN:  .   BP is well controlled.   Will see her in 6 months   Mertie Moores, MD  07/05/2017 2:02 PM    [facility] [place],  [place], [place]  [address] Pager [phone] Phone: [phone]; Fax: [phone]

## 2017-07-05 NOTE — Patient Instructions (Signed)
Medication Instructions:  Your physician recommends that you continue on your current medications as directed. Please refer to the Current Medication list given to you today.   Labwork: TODAY - cholesterol, liver panel, basic metabolic panel   Testing/Procedures: None Ordered   Follow-Up: Your physician wants you to follow-up in: 6 months with Dr. Nahser. You will receive a reminder letter in the mail two months in advance. If you don't receive a letter, please call our office to schedule the follow-up appointment.   If you need a refill on your cardiac medications before your next appointment, please call your pharmacy.   Thank you for choosing [facility]! Ben Habermann, RN [phone]    

## 2017-07-06 LAB — LIPID PANEL
CHOL/HDL RATIO: 2.1 ratio (ref 0.0–4.4)
Cholesterol, Total: 209 mg/dL — ABNORMAL HIGH (ref 100–199)
HDL: 99 mg/dL (ref >39)
LDL Calculated: 89 mg/dL (ref 0–99)
Triglycerides: 106 mg/dL (ref 0–149)
VLDL Cholesterol Cal: 21 mg/dL (ref 5–40)

## 2017-07-06 LAB — BASIC METABOLIC PANEL
BUN / CREAT RATIO: 20 (ref 12–28)
BUN: 21 mg/dL (ref 8–27)
CALCIUM: 9.6 mg/dL (ref 8.7–10.3)
CHLORIDE: 96 mmol/L (ref 96–106)
CO2: 24 mmol/L (ref 20–29)
Creatinine, Ser: 1.04 mg/dL — ABNORMAL HIGH (ref 0.57–1.00)
GFR calc Af Amer: 55 mL/min/{1.73_m2} — ABNORMAL LOW (ref >59)
GFR calc non Af Amer: 48 mL/min/{1.73_m2} — ABNORMAL LOW (ref >59)
GLUCOSE: 123 mg/dL — AB (ref 65–99)
Potassium: 4 mmol/L (ref 3.5–5.2)
Sodium: 134 mmol/L (ref 134–144)

## 2017-07-06 LAB — HEPATIC FUNCTION PANEL
ALBUMIN: 4.6 g/dL (ref 3.5–4.7)
ALT: 9 IU/L (ref 0–32)
AST: 17 IU/L (ref 0–40)
Alkaline Phosphatase: 64 IU/L (ref 39–117)
BILIRUBIN TOTAL: 0.2 mg/dL (ref 0.0–1.2)
BILIRUBIN, DIRECT: 0.08 mg/dL (ref 0.00–0.40)
TOTAL PROTEIN: 6.8 g/dL (ref 6.0–8.5)

## 2017-07-10 ENCOUNTER — Telehealth: Payer: Self-pay | Admitting: Nurse Practitioner

## 2017-07-10 ENCOUNTER — Telehealth: Payer: Self-pay | Admitting: Cardiovascular Disease

## 2017-07-10 DIAGNOSIS — E782 Mixed hyperlipidemia: Secondary | ICD-10-CM

## 2017-07-10 MED ORDER — ROSUVASTATIN CALCIUM 10 MG PO TABS: 10.0000 mg | ORAL_TABLET | Freq: Every day | ORAL | 3 refills | Status: DC

## 2017-07-10 NOTE — Telephone Encounter (Signed)
New Message  Pt c/o medication issue:  1. Name of Medication: rosuvastatin   2. How are you currently taking this medication (dosage and times per day)? ?  3. Are you having a reaction (difficulty breathing--STAT)? no  4. What is your medication issue? Per pt would like to know what dose she needs to take. Please call back to discuss

## 2017-07-10 NOTE — Telephone Encounter (Signed)
-----   Message from Thayer Headings, MD sent at 07/09/2017  8:24 PM EST ----- LDL levels are still a bit too high If she agrees, lets DC Pravachol. Start Crestor 10 mg a day  Check lipids, liver , BMP om 3 months

## 2017-07-10 NOTE — Telephone Encounter (Signed)
Reviewed lab results and plan of care with patient. She verbalized understanding and agreement with plan to stop pravastatin and start rosuvastatin. I had her get her pill bottles so that she would know which medication to d/c. She is scheduled for repeat lab appointment on 10/11/17 and I advised her to call sooner with questions or concerns. She thanked me for the call.

## 2017-07-10 NOTE — Telephone Encounter (Signed)
Spoke with patient and reviewed the information regarding the new Rx for Crestor 10 mg once daily. She verbalized understanding and thanked me for the call.

## 2017-08-02 ENCOUNTER — Telehealth: Payer: Self-pay | Admitting: Cardiovascular Disease

## 2017-08-02 MED ORDER — METOPROLOL TARTRATE 25 MG PO TABS: 25.0000 mg | ORAL_TABLET | Freq: Two times a day (BID) | ORAL | 3 refills | Status: DC

## 2017-08-02 NOTE — Telephone Encounter (Signed)
Pt called to conf her METOPROLOL 25 mg is being filled.  Per her Pharm they are waiting on this office to reply.

## 2017-08-02 NOTE — Telephone Encounter (Signed)
Pt's medication was sent to pt's pharmacy as requested. Confirmation received.  °

## 2017-08-25 IMAGING — CT CT HEAD W/O CM
1 series · 15 of 30 positions shown, 19 images · non-contrast
Comparison: CT head 07/13/2009

CLINICAL DATA: Patient fell today going down steps. Patient does
not remember falling. Head pain. Confusion. Neck pain.

EXAM:
CT HEAD WITHOUT CONTRAST
CT CERVICAL SPINE WITHOUT CONTRAST
TECHNIQUE: Multidetector CT imaging of the head and cervical spine was
performed following the standard protocol without intravenous
contrast. Multiplanar CT image reconstructions of the cervical spine
were also generated.

[Series 2: headseq 4.8 h30s · axial · 0.46mm/px · z∈[-50,+90]mm · 15 of 33 slices shown, 19 images]
[im 2/33  brain]
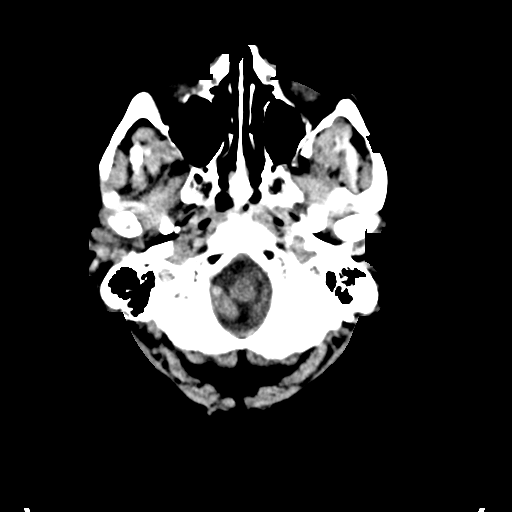
[im 2/33  bone]
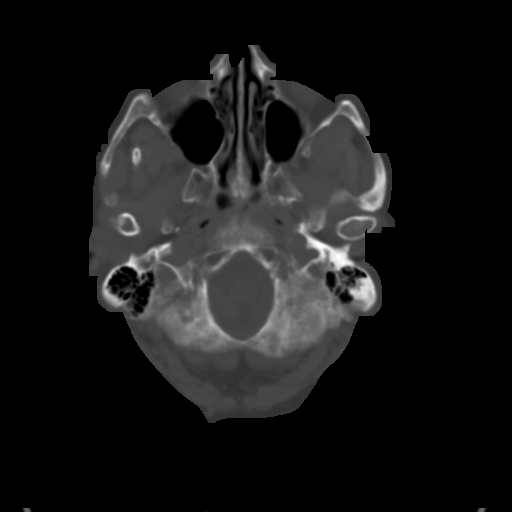
[im 4/33  brain]
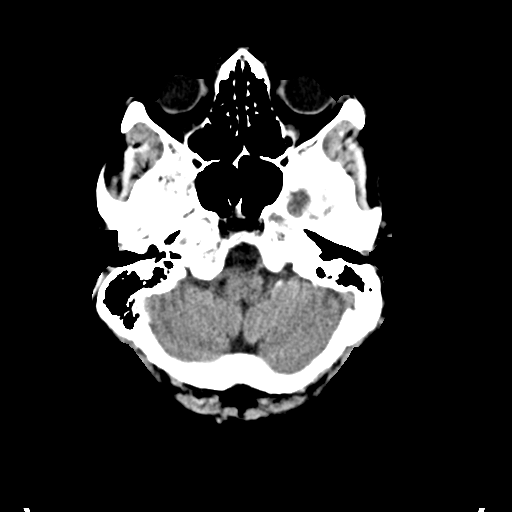
[im 6/33  brain]
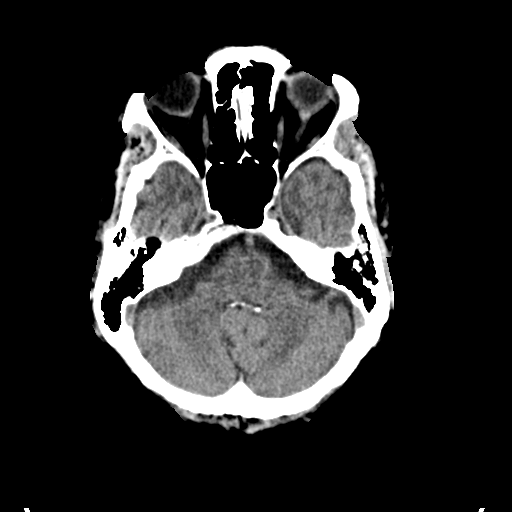
[im 8/33  brain]
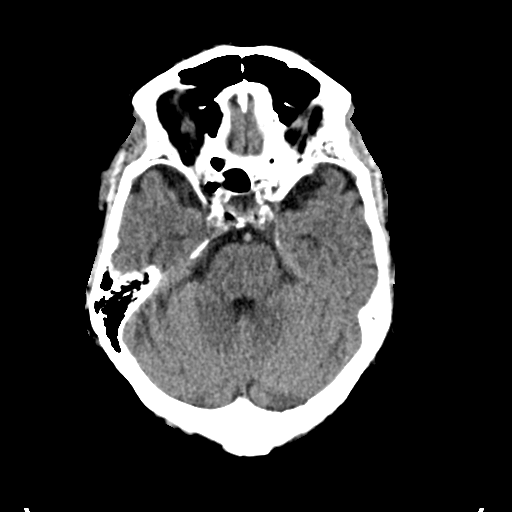
[im 10/33  brain]
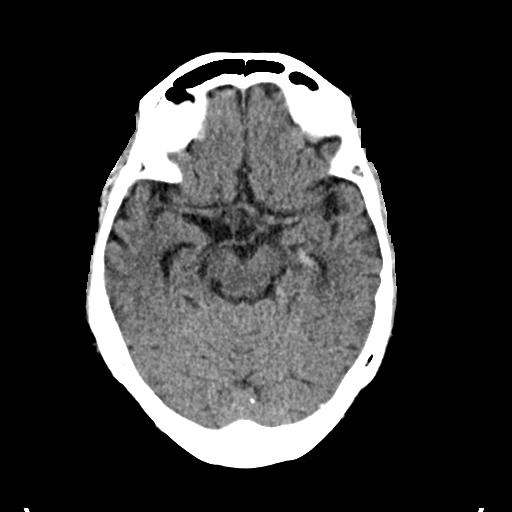
[im 10/33  bone]
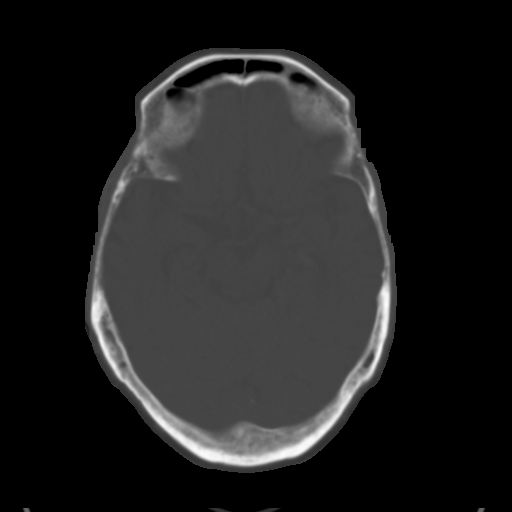
[im 13/33  brain]
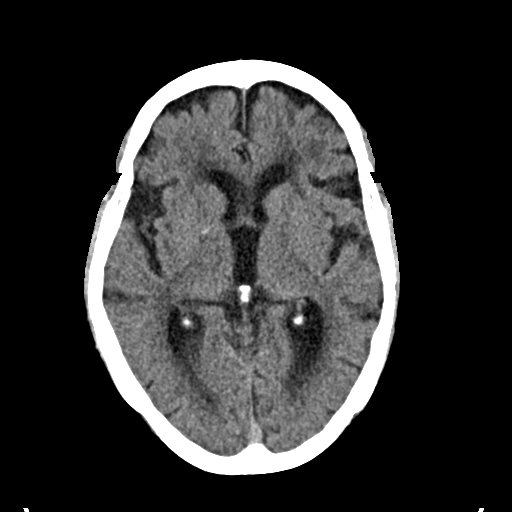
[im 15/33  brain]
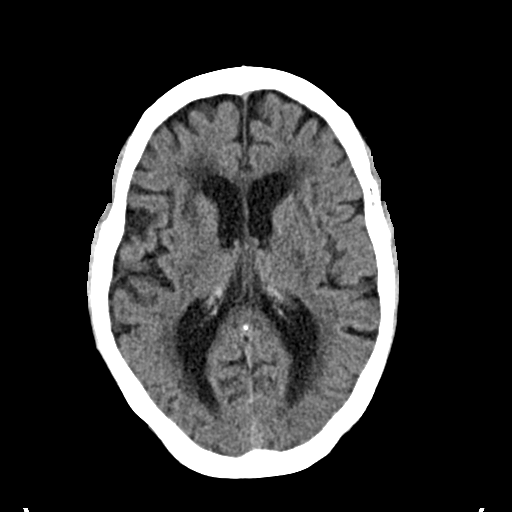
[im 17/33  brain]
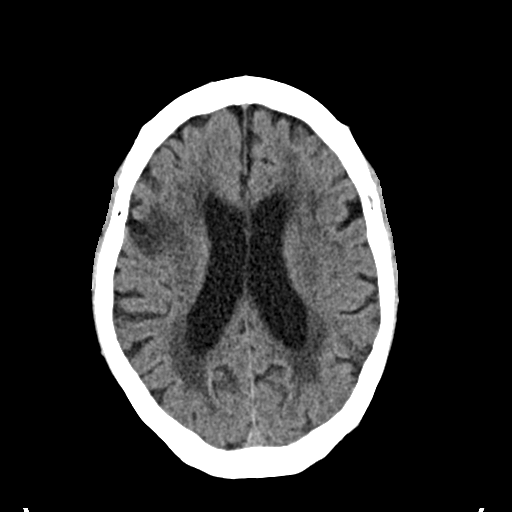
[im 18/33  brain]
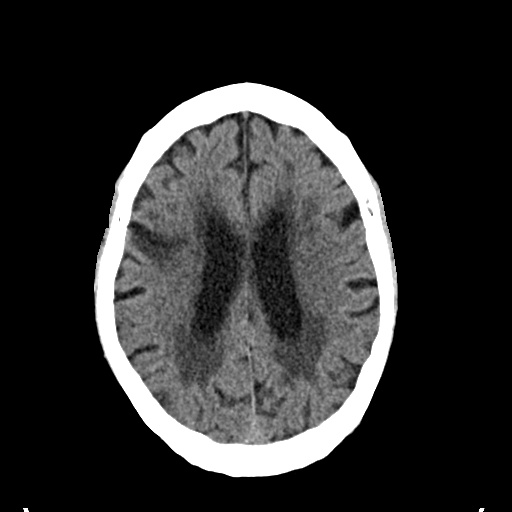
[im 18/33  bone]
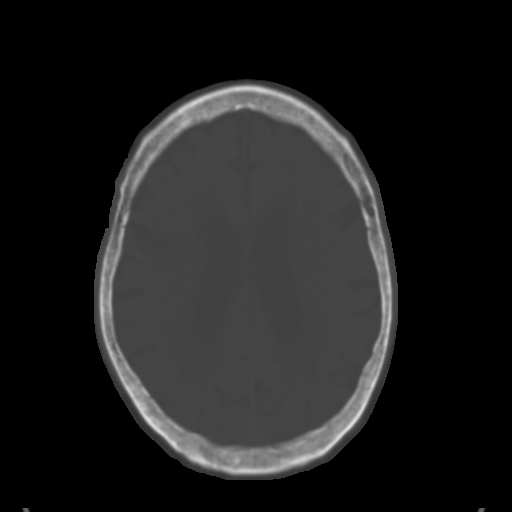
[im 20/33  brain]
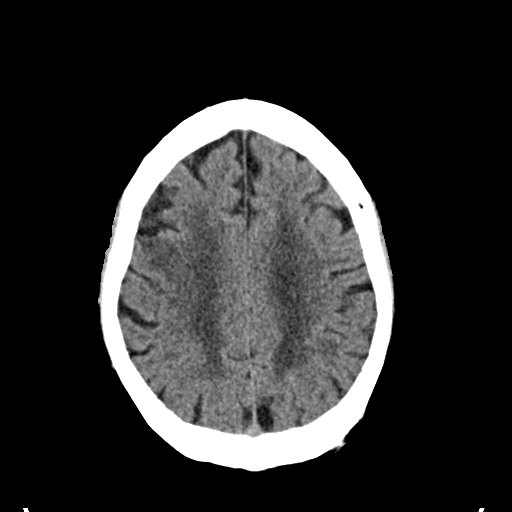
[im 23/33  brain]
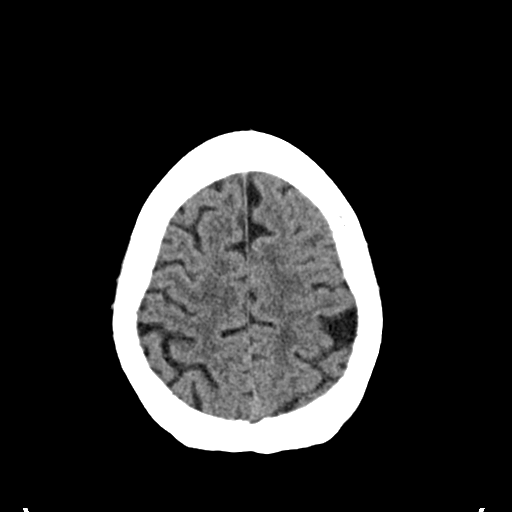
[im 25/33  brain]
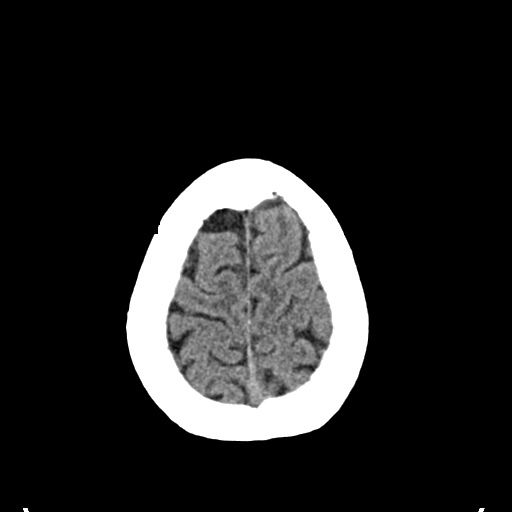
[im 27/33  brain]
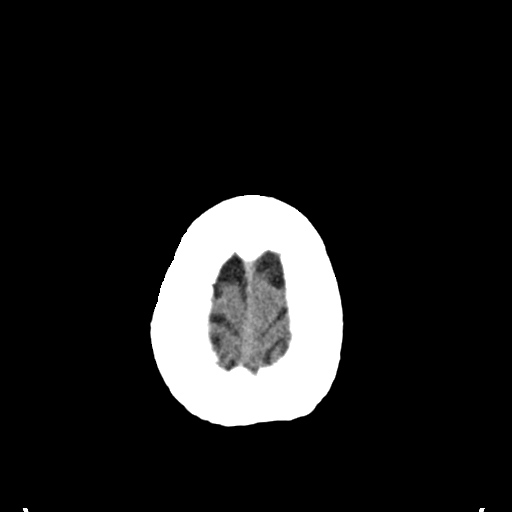
[im 27/33  bone]
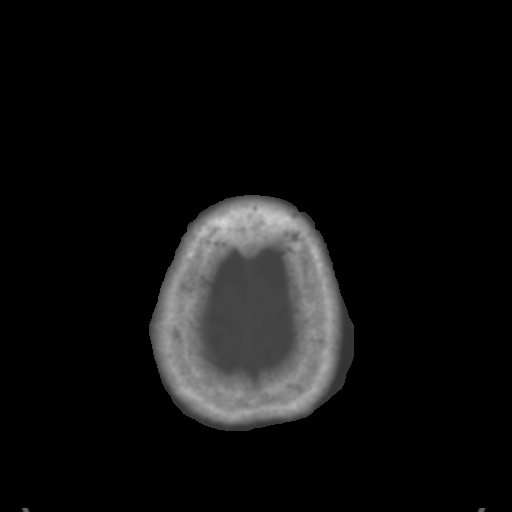
[im 29/33  brain]
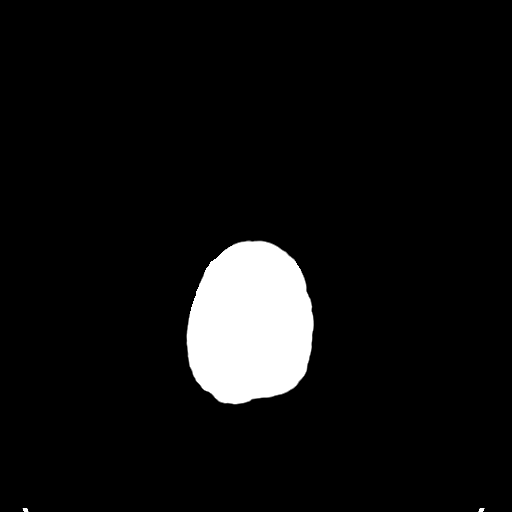
[im 31/33  brain]
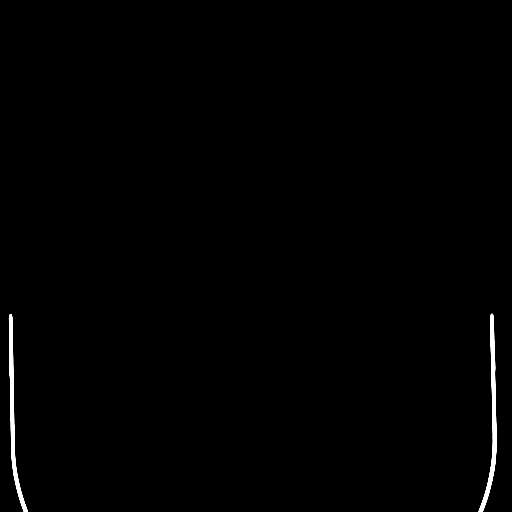

[15 of 30 positions shown; findings below may reference images not displayed]

FINDINGS: CT HEAD FINDINGS

No evidence for acute infarction, hemorrhage, mass lesion,
hydrocephalus, or extra-axial fluid. Advanced atrophy. Chronic
microvascular ischemic change. Remote RIGHT frontal cortical and
subcortical infarct. Vascular calcification. No significant scalp
hematoma or laceration. Negative sinuses and mastoids. BILATERAL
cataract extraction. Compared with 6888, atrophy and small vessel
disease have progressed. RIGHT frontal infarct was not present at
that time.

CT CERVICAL SPINE FINDINGS

There is no visible cervical spine fracture, traumatic subluxation,
prevertebral soft tissue swelling, or intraspinal hematoma. Mild
degenerative anterolisthesis at C3-4, C4-5, and C5-6 is facet
mediated. There is skeletal osteopenia. There is significant disc
space narrowing at C5-6 and C6-7 of a chronic nature. Advanced facet
arthropathy is worst at C5-6. Advanced atherosclerosis with
dolichoectasia of the subclavians. No lung apex lesion. No neck
masses. Airway midline. No upper rib fractures.
IMPRESSION: Atrophy and small vessel disease.  Old RIGHT frontal infarct.

No skull fracture or intracranial hemorrhage.

No cervical spine fracture or traumatic subluxation. Multilevel
spondylosis, worst at C5-6 and C6-7.

## 2017-09-06 ENCOUNTER — Telehealth: Payer: Self-pay | Admitting: Cardiology

## 2017-09-06 ENCOUNTER — Ambulatory Visit (INDEPENDENT_AMBULATORY_CARE_PROVIDER_SITE_OTHER): Payer: Medicare HMO | Admitting: *Deleted

## 2017-09-06 DIAGNOSIS — I495 Sick sinus syndrome: Secondary | ICD-10-CM | POA: Diagnosis not present

## 2017-09-06 NOTE — Progress Notes (Signed)
Remote pacemaker transmission.   

## 2017-09-06 NOTE — Telephone Encounter (Signed)
Spoke with pt and reminded pt of remote transmission that is due today. Pt verbalized understanding.   

## 2017-09-07 ENCOUNTER — Encounter: Payer: Self-pay | Admitting: Cardiology

## 2017-09-11 ENCOUNTER — Other Ambulatory Visit: Payer: Self-pay | Admitting: Cardiovascular Disease

## 2017-09-11 DIAGNOSIS — I480 Paroxysmal atrial fibrillation: Secondary | ICD-10-CM

## 2017-09-12 IMAGING — CT CT L SPINE W/O CM
1 of 8 series · 2 of 33 positions shown · non-contrast
Comparison: Chest CT 10/25/2013, lumbar spine radiographs
08/05/2011 and 08/30/2015 and cervical spine CT 08/12/2015.

CLINICAL DATA: Progressive low back and hip pain after falling down
steps 3 weeks ago. History of thoracolumbar compression deformities
and spinal augmentation at L1.

EXAM:
CT THORACIC AND LUMBAR SPINE WITHOUT CONTRAST
TECHNIQUE: Multidetector CT imaging of the thoracic and lumbar spine was
performed without contrast. Multiplanar CT image reconstructions
were also generated.

[Series 4010: sag bone · sagittal · 0.35mm/px · 2 of 48 slices shown]
[im 16/48  bone]
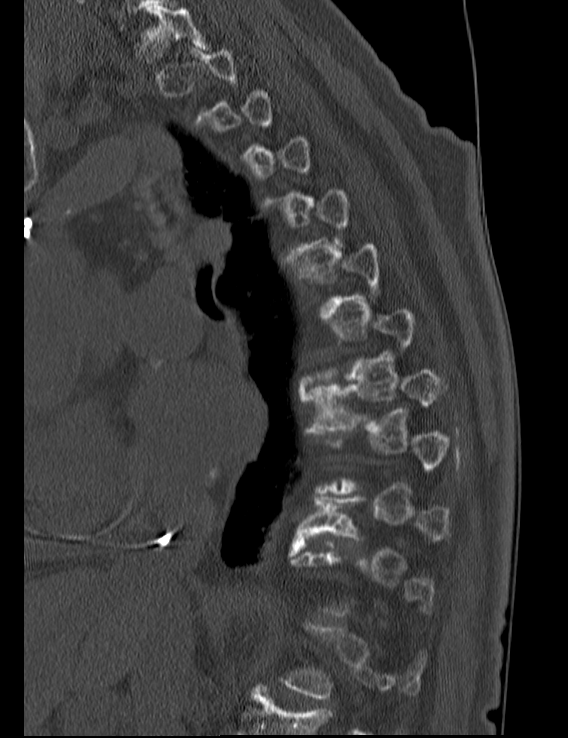
[im 32/48  bone]
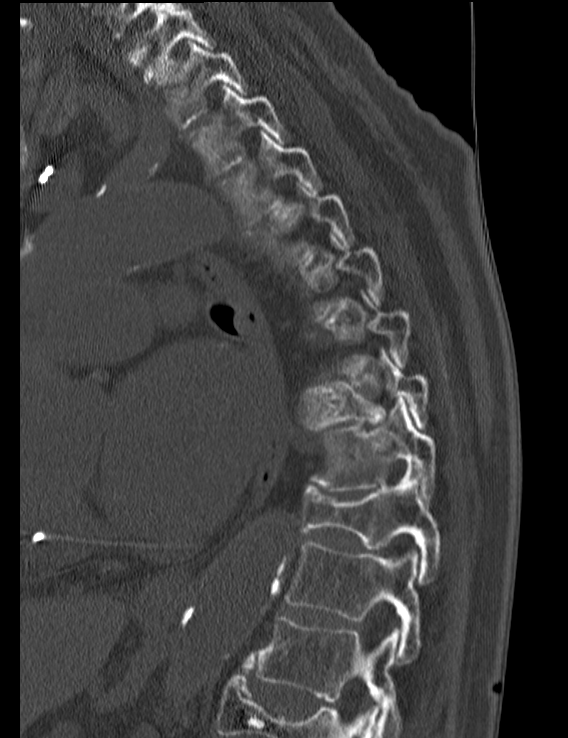

[2 of 33 positions shown; findings below may reference images not displayed]

FINDINGS: CT THORACIC SPINE FINDINGS

There is a mild convex right scoliosis. The lateral alignment is
normal.

No acute findings are seen within the upper thoracic spine. There is
a Schmorl's node involving the superior endplate of T7 which does
not appear acute.

There is a severe biconcave T8 fracture resulting in 75% loss
vertebral body height and 5 mm of osseous retropulsion. The pedicles
are intact. There is no subluxation. This vertebral body is
sclerotic, probably related to partial fracture healing. No
surrounding paraspinal hematoma identified. There is vacuum disc
phenomenon at T9-10. This fracture is new compared with the chest CT

Severe biconcave compression fracture at T10 is unchanged from the
0020 radiographs. There is minimal osseous retropulsion which
appears unchanged. There is a stable chronic Schmorl's node
involving the superior endplate of T12.

Cardiomegaly, cardiac pacemaker, atherosclerosis and chronic lung
disease are noted.

CT LUMBAR SPINE FINDINGS

There is 5 mm of anterolisthesis at L4-5 secondary to facet disease.
The alignment is otherwise normal. There is no evidence of pars
defect.

Chronic L1 compression fracture appears unchanged status post spinal
augmentation. There is approximately 60% loss of vertebral body
height and chronic osseous retropulsion. The pedicles appears
intact.

L2 appears intact.

There is a severe chronic burst fracture at L3 which appears
unchanged from the 0020 radiographs. There is greater than 75% loss
vertebral body height and approximately 6 mm of osseous
retropulsion. The osseous retropulsion results in moderate mass
effect on the thecal sac. There is also moderate multifactorial
spinal stenosis at the L2-3 and L3-4 disc space levels due to disc
bulging, facet and ligamentous hypertrophy.

There is a mild superior endplate compression fracture at L4 which
is associated with sharp cortical margins and a small amount of
paraspinal hemorrhage, suggesting acuity. This fracture demonstrates
no significant osseous retropulsion. There is less than 25% loss of
vertebral body height.

L5 and the upper sacrum are intact. There is severe multifactorial
spinal stenosis at L4-5 due to disc bulging, facet and ligamentous
hypertrophy.
IMPRESSION: 1. No definite acute findings in the thoracic spine.
2. Severe biconcave T8 fracture is new from 0020, but sclerotic and
without paraspinal changes to suggest acuity. This fracture is
associated with osseous retropulsion.
3. Superior endplate compression deformity at L4 appears acute.
4. Stable chronic fractures at L1 and L3.
5. Degenerative disc disease, facet and ligamentous hypertrophy
contribute to multifactorial spinal stenosis in the lower lumbar
spine, greatest at L4-5 where it is severe.

## 2017-09-12 IMAGING — DX DG SACRUM/COCCYX 2+V
3 series · 3 of 3 positions shown · non-contrast
Comparison: August 05, 2011

CLINICAL DATA: Pain following fall 2 weeks prior with progressive
pain

EXAM:
SACRUM AND COCCYX - 2+ VIEW

[t sacrum ap]
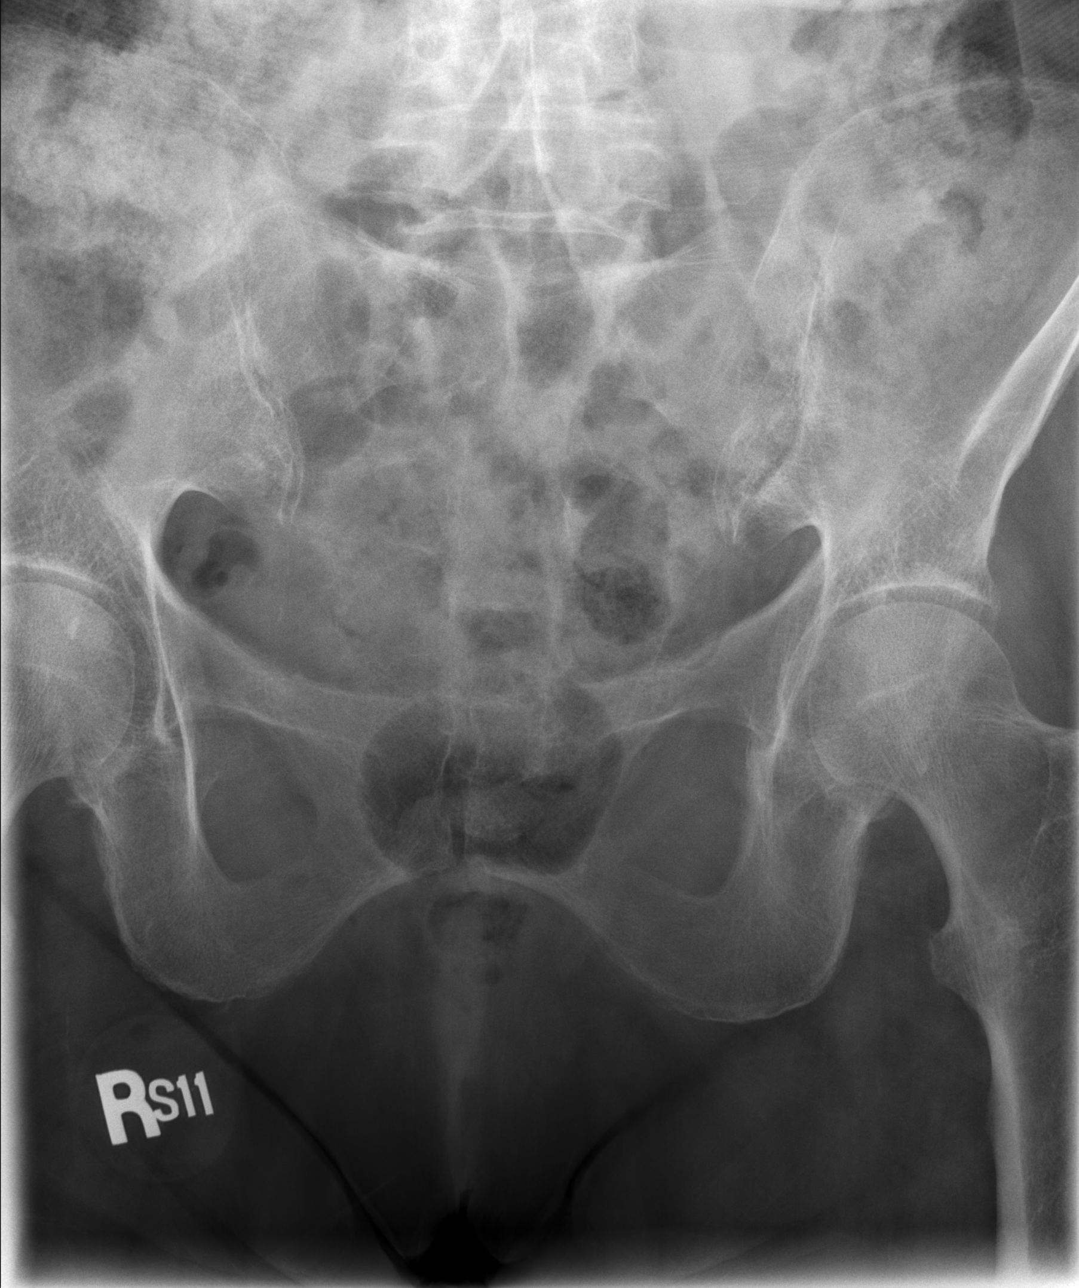

[t coccyx ap]
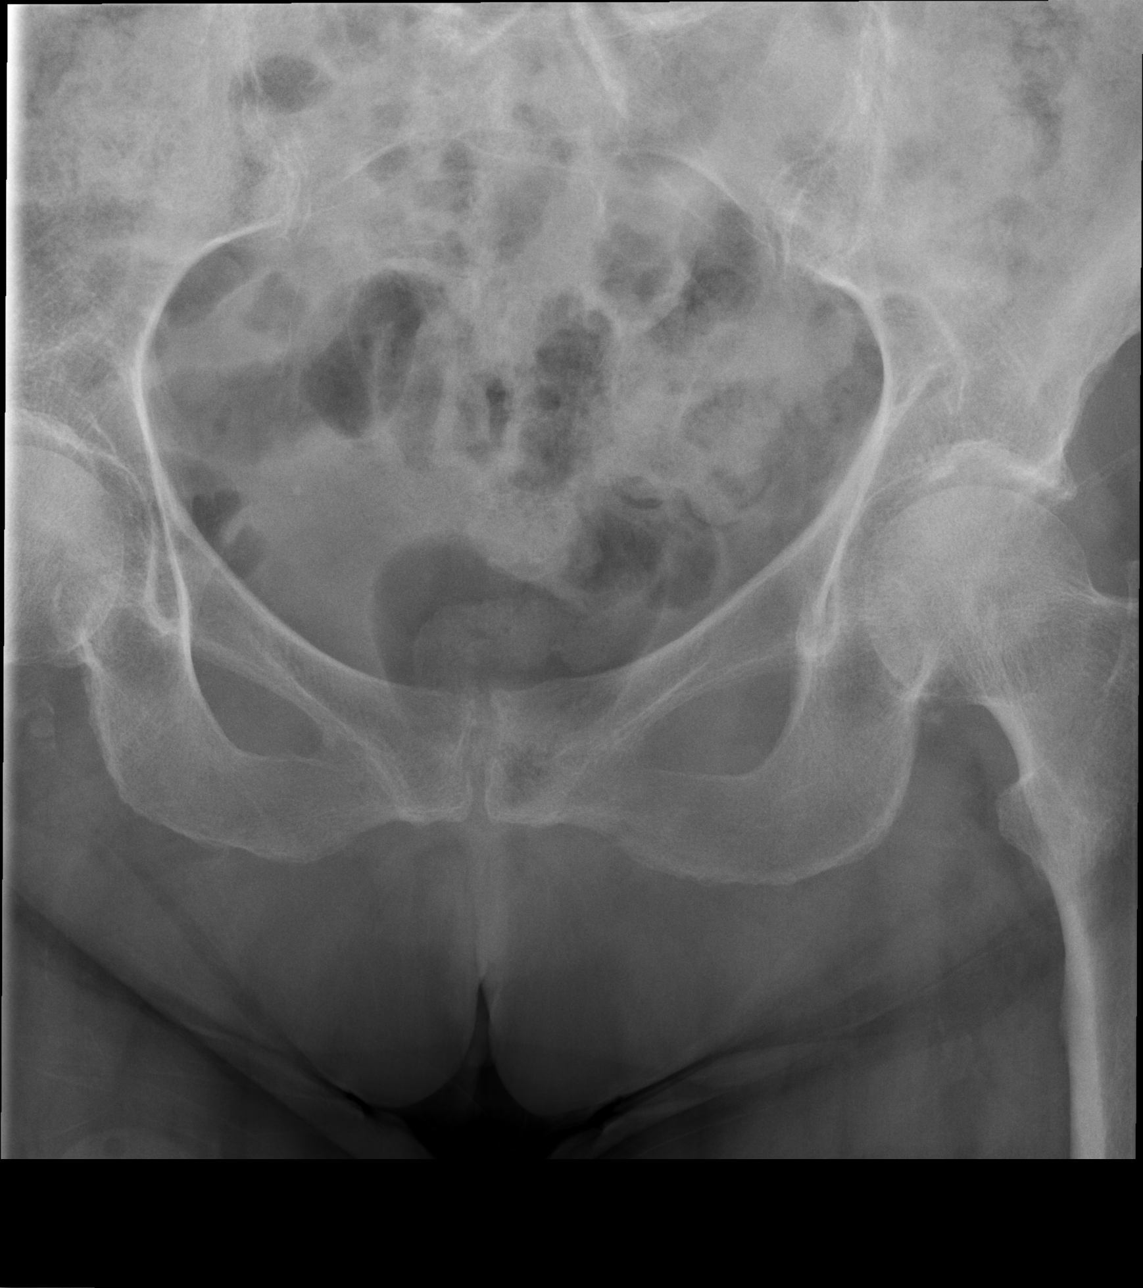

[t sacrum coccyx lat]
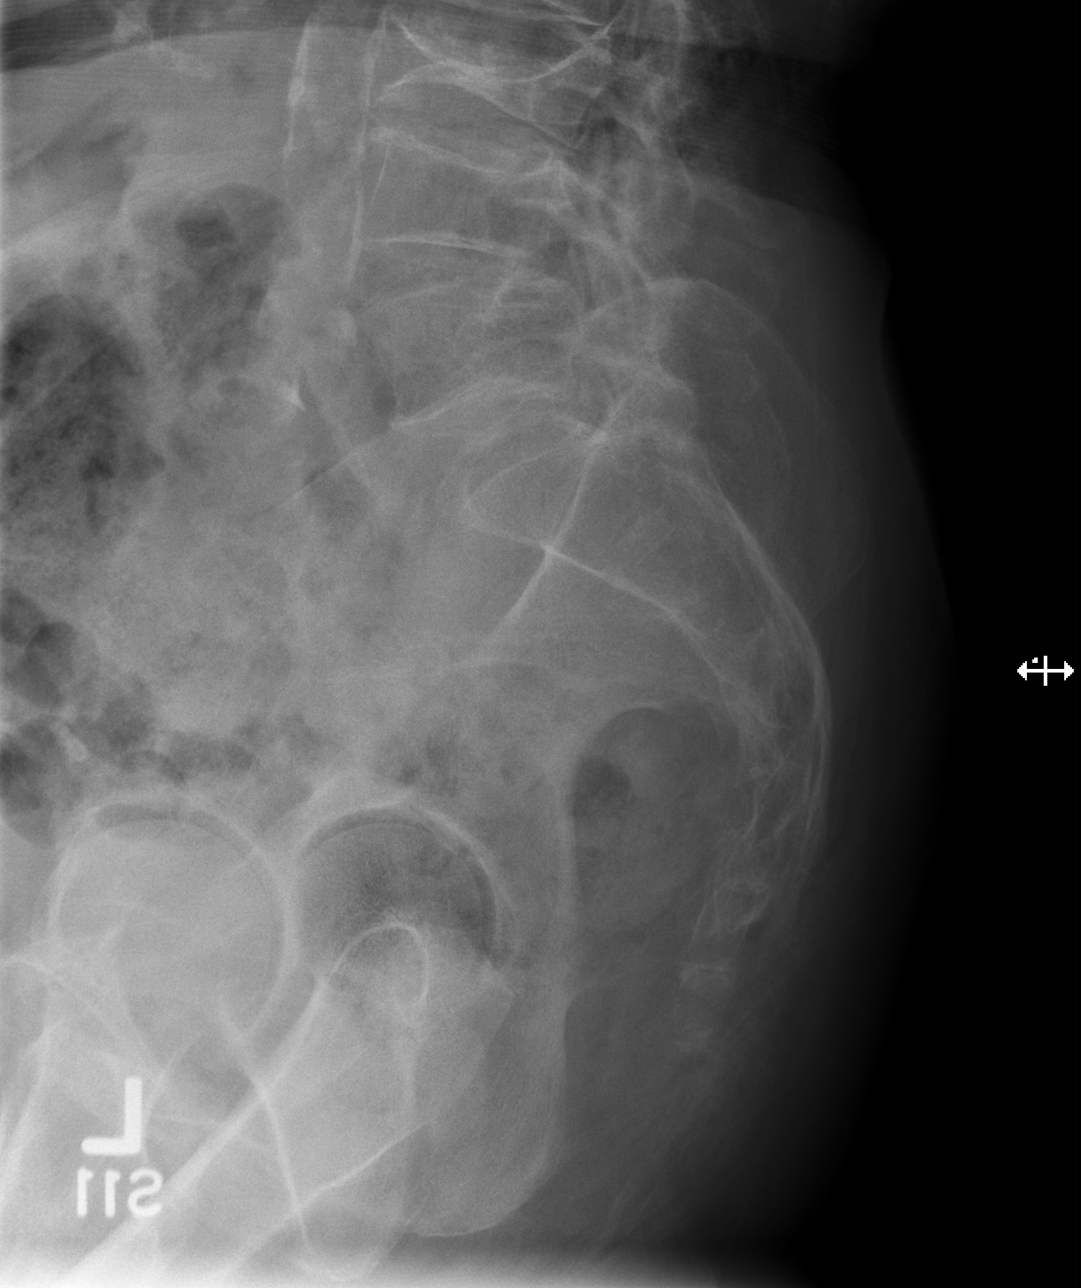

[3 of 3 positions shown; findings below may reference images not displayed]

FINDINGS: Frontal and lateral views obtained. There is no demonstrable sacrum
or coccyx fracture or diastases. Marked collapse of the L3 vertebral
body is stable. Mild anterolisthesis of L4 on L5 is stable. There is
mild osteoarthritic change in each sacroiliac joint. There are foci
of atherosclerotic vascular calcification in the aorta and common
iliac arteries.
IMPRESSION: Chronic collapse of the L3 vertebral body, stable from 9609. Mild
spondylolisthesis at L4-5 is likewise stable. No sacrum or coccyx
fracture or diastases. Osteoarthritic change in each sacroiliac
joint is noted.

## 2017-09-14 ENCOUNTER — Telehealth: Payer: Self-pay | Admitting: *Deleted

## 2017-09-14 NOTE — Telephone Encounter (Signed)
-----   Message from Thayer Headings, MD sent at 09/12/2017  8:57 PM EST ----- Regarding: RE: Eliquis Dosage  It seems like she has had GI bleeding issues in the past. I'll have to look through the chart. She is very elderly,   Weight is very close to the 60 kg I think the Eliquis 2.5 BID is the best dose for her  Thank you for bringing this up  ----- Message ----- From: Zenovia Jarred, RN Sent: 09/11/2017   4:14 PM To: Thayer Headings, MD Subject: Eliquis Dosage                                 Pt is currently on Eliquis 2.5mg  BID. She is 84 yrs old, her last SCr 06/2017 was 1.04 and the previous results were less than 1.5, her last OV weight was above 60Kg at 61.1Kg, please advise if you want to continue the 2.5mg  dosage or increase to 5mg  dosage? Thank you.  Drayden Lukas

## 2017-09-21 DIAGNOSIS — I480 Paroxysmal atrial fibrillation: Secondary | ICD-10-CM | POA: Diagnosis not present

## 2017-09-21 DIAGNOSIS — M810 Age-related osteoporosis without current pathological fracture: Secondary | ICD-10-CM | POA: Diagnosis not present

## 2017-09-21 DIAGNOSIS — N183 Chronic kidney disease, stage 3 (moderate): Secondary | ICD-10-CM | POA: Diagnosis not present

## 2017-09-21 DIAGNOSIS — I10 Essential (primary) hypertension: Secondary | ICD-10-CM | POA: Diagnosis not present

## 2017-09-26 LAB — CUP PACEART REMOTE DEVICE CHECK
Brady Statistic AP VS Percent: 1.27 %
Brady Statistic RA Percent Paced: 1.27 %
Brady Statistic RV Percent Paced: 0.02 %
Implantable Lead Implant Date: 20130626
Implantable Lead Location: 753860
Lead Channel Impedance Value: 400 Ohm
Lead Channel Impedance Value: 416 Ohm
Lead Channel Setting Pacing Amplitude: 2 V
Lead Channel Setting Pacing Pulse Width: 0.4 ms
Lead Channel Setting Sensing Sensitivity: 0.9 mV
MDC IDC LEAD IMPLANT DT: 20130626
MDC IDC LEAD LOCATION: 753859
MDC IDC MSMT BATTERY VOLTAGE: 2.97 V
MDC IDC MSMT LEADCHNL RA SENSING INTR AMPL: 0.891 mV
MDC IDC MSMT LEADCHNL RV SENSING INTR AMPL: 6.244 mV
MDC IDC PG IMPLANT DT: 20130626
MDC IDC SESS DTM: 20190123202628
MDC IDC SET LEADCHNL RV PACING AMPLITUDE: 2.5 V
MDC IDC STAT BRADY AP VP PERCENT: 0 %
MDC IDC STAT BRADY AS VP PERCENT: 0.01 %
MDC IDC STAT BRADY AS VS PERCENT: 98.71 %

## 2017-10-11 ENCOUNTER — Other Ambulatory Visit: Payer: Medicare HMO | Admitting: *Deleted

## 2017-10-11 DIAGNOSIS — E782 Mixed hyperlipidemia: Secondary | ICD-10-CM | POA: Diagnosis not present

## 2017-10-11 LAB — HEPATIC FUNCTION PANEL
ALBUMIN: 4.7 g/dL (ref 3.5–4.7)
ALT: 13 IU/L (ref 0–32)
AST: 19 IU/L (ref 0–40)
Alkaline Phosphatase: 67 IU/L (ref 39–117)
Bilirubin Total: 0.3 mg/dL (ref 0.0–1.2)
Bilirubin, Direct: 0.11 mg/dL (ref 0.00–0.40)
Total Protein: 6.9 g/dL (ref 6.0–8.5)

## 2017-10-11 LAB — LIPID PANEL
CHOL/HDL RATIO: 2 ratio (ref 0.0–4.4)
Cholesterol, Total: 213 mg/dL — ABNORMAL HIGH (ref 100–199)
HDL: 109 mg/dL (ref >39)
LDL Calculated: 86 mg/dL (ref 0–99)
TRIGLYCERIDES: 88 mg/dL (ref 0–149)
VLDL Cholesterol Cal: 18 mg/dL (ref 5–40)

## 2017-10-11 LAB — BASIC METABOLIC PANEL
BUN/Creatinine Ratio: 21 (ref 12–28)
BUN: 20 mg/dL (ref 8–27)
CALCIUM: 9.4 mg/dL (ref 8.7–10.3)
CO2: 25 mmol/L (ref 20–29)
Chloride: 95 mmol/L — ABNORMAL LOW (ref 96–106)
Creatinine, Ser: 0.97 mg/dL (ref 0.57–1.00)
GFR calc Af Amer: 60 mL/min/{1.73_m2} (ref >59)
GFR, EST NON AFRICAN AMERICAN: 52 mL/min/{1.73_m2} — AB (ref >59)
Glucose: 78 mg/dL (ref 65–99)
POTASSIUM: 4.5 mmol/L (ref 3.5–5.2)
Sodium: 134 mmol/L (ref 134–144)

## 2017-11-08 DIAGNOSIS — M4850XD Collapsed vertebra, not elsewhere classified, site unspecified, subsequent encounter for fracture with routine healing: Secondary | ICD-10-CM | POA: Diagnosis not present

## 2017-11-08 DIAGNOSIS — Z23 Encounter for immunization: Secondary | ICD-10-CM | POA: Diagnosis not present

## 2017-11-08 DIAGNOSIS — I480 Paroxysmal atrial fibrillation: Secondary | ICD-10-CM | POA: Diagnosis not present

## 2017-11-08 DIAGNOSIS — Z1211 Encounter for screening for malignant neoplasm of colon: Secondary | ICD-10-CM | POA: Diagnosis not present

## 2017-11-08 DIAGNOSIS — E559 Vitamin D deficiency, unspecified: Secondary | ICD-10-CM | POA: Diagnosis not present

## 2017-11-08 DIAGNOSIS — E2839 Other primary ovarian failure: Secondary | ICD-10-CM | POA: Diagnosis not present

## 2017-11-08 DIAGNOSIS — I10 Essential (primary) hypertension: Secondary | ICD-10-CM | POA: Diagnosis not present

## 2017-11-08 DIAGNOSIS — Z1389 Encounter for screening for other disorder: Secondary | ICD-10-CM | POA: Diagnosis not present

## 2017-11-08 DIAGNOSIS — Z0000 Encounter for general adult medical examination without abnormal findings: Secondary | ICD-10-CM | POA: Diagnosis not present

## 2017-11-15 DIAGNOSIS — M810 Age-related osteoporosis without current pathological fracture: Secondary | ICD-10-CM | POA: Diagnosis not present

## 2017-11-15 DIAGNOSIS — M8589 Other specified disorders of bone density and structure, multiple sites: Secondary | ICD-10-CM | POA: Diagnosis not present

## 2017-11-23 DIAGNOSIS — N183 Chronic kidney disease, stage 3 (moderate): Secondary | ICD-10-CM | POA: Diagnosis not present

## 2017-11-23 DIAGNOSIS — M810 Age-related osteoporosis without current pathological fracture: Secondary | ICD-10-CM | POA: Diagnosis not present

## 2017-11-23 DIAGNOSIS — I10 Essential (primary) hypertension: Secondary | ICD-10-CM | POA: Diagnosis not present

## 2017-11-23 DIAGNOSIS — I2510 Atherosclerotic heart disease of native coronary artery without angina pectoris: Secondary | ICD-10-CM | POA: Diagnosis not present

## 2017-11-23 DIAGNOSIS — I480 Paroxysmal atrial fibrillation: Secondary | ICD-10-CM | POA: Diagnosis not present

## 2017-11-28 DIAGNOSIS — M8000XD Age-related osteoporosis with current pathological fracture, unspecified site, subsequent encounter for fracture with routine healing: Secondary | ICD-10-CM | POA: Diagnosis not present

## 2017-12-06 ENCOUNTER — Encounter: Payer: Medicare HMO | Admitting: *Deleted

## 2017-12-08 ENCOUNTER — Encounter: Payer: Self-pay | Admitting: Cardiology

## 2017-12-13 DIAGNOSIS — S61411A Laceration without foreign body of right hand, initial encounter: Secondary | ICD-10-CM | POA: Diagnosis not present

## 2017-12-13 DIAGNOSIS — W540XXA Bitten by dog, initial encounter: Secondary | ICD-10-CM | POA: Diagnosis not present

## 2017-12-18 ENCOUNTER — Ambulatory Visit (INDEPENDENT_AMBULATORY_CARE_PROVIDER_SITE_OTHER): Payer: Medicare HMO | Admitting: *Deleted

## 2017-12-18 ENCOUNTER — Telehealth: Payer: Self-pay | Admitting: Internal Medicine

## 2017-12-18 DIAGNOSIS — I495 Sick sinus syndrome: Secondary | ICD-10-CM | POA: Diagnosis not present

## 2017-12-18 NOTE — Telephone Encounter (Signed)
New message ° ° ° ° °1. Has your device fired? NO ° °2. Is you device beeping? NO ° °3. Are you experiencing draining or swelling at device site? NO ° °4. Are you calling to see if we received your device transmission? YES ° °5. Have you passed out? NO ° ° ° °Please route to Device Clinic Pool °

## 2017-12-18 NOTE — Progress Notes (Signed)
Remote pacemaker transmission.   

## 2017-12-18 NOTE — Telephone Encounter (Signed)
Returned patient's call.  Advised we did receive her transmission today.  Next transmission will be scheduled for 03/19/18.  She is appreciative of call and denies additional questions or concerns at this time.

## 2017-12-20 ENCOUNTER — Encounter: Payer: Self-pay | Admitting: Cardiology

## 2017-12-20 DIAGNOSIS — M810 Age-related osteoporosis without current pathological fracture: Secondary | ICD-10-CM | POA: Diagnosis not present

## 2017-12-20 DIAGNOSIS — W540XXD Bitten by dog, subsequent encounter: Secondary | ICD-10-CM | POA: Diagnosis not present

## 2017-12-20 DIAGNOSIS — S61411D Laceration without foreign body of right hand, subsequent encounter: Secondary | ICD-10-CM | POA: Diagnosis not present

## 2017-12-20 DIAGNOSIS — M1990 Unspecified osteoarthritis, unspecified site: Secondary | ICD-10-CM | POA: Diagnosis not present

## 2017-12-26 DIAGNOSIS — R351 Nocturia: Secondary | ICD-10-CM | POA: Diagnosis not present

## 2017-12-26 DIAGNOSIS — R3914 Feeling of incomplete bladder emptying: Secondary | ICD-10-CM | POA: Diagnosis not present

## 2018-01-02 ENCOUNTER — Encounter: Payer: Self-pay | Admitting: Cardiovascular Disease

## 2018-01-02 ENCOUNTER — Ambulatory Visit: Payer: Medicare HMO | Admitting: Cardiovascular Disease

## 2018-01-02 VITALS — BP 110/82 | HR 77 | Ht 63.0 in | Wt 136.8 lb

## 2018-01-02 DIAGNOSIS — Z951 Presence of aortocoronary bypass graft: Secondary | ICD-10-CM

## 2018-01-02 DIAGNOSIS — E782 Mixed hyperlipidemia: Secondary | ICD-10-CM

## 2018-01-02 NOTE — Patient Instructions (Signed)
Medication Instructions:  Your physician recommends that you continue on your current medications as directed. Please refer to the Current Medication list given to you today.   Labwork: None Ordered   Testing/Procedures: None Ordered   Follow-Up: Your physician wants you to follow-up in: 6 months with Dr. Nahser.  You will receive a reminder letter in the mail two months in advance. If you don't receive a letter, please call our office to schedule the follow-up appointment.   If you need a refill on your cardiac medications before your next appointment, please call your pharmacy.   Thank you for choosing [facility]! Marrietta Thunder, RN [phone]    

## 2018-01-02 NOTE — Progress Notes (Signed)
Donna Ortega Date of Birth  Sep 19, 1925 [facility]      [address]    [address]    [place], [place]  [address]      Problems:  1. CAD - s/p CABG 2007, 2. PACs 3. Drug Rash - amiodarone 4. GI Bleed 5. Anemia 6. hyponatremia  History of Present Illness:  Donna Ortega is an 84 year old female with a history of coronary artery disease and atrial fibrillation. She was admitted to the hospital in December with a GI bleed, anemia, and rapid atrial fibrillation. She was discharged with the additional medications of amiodarone and Cardizem CD.  She was seen in mid January and was doing fairly well.  She stopped her Amiodarone due to drug rash.  She still has generalized fatigue and generalized weakness. She wonders if it may be due to the Lasix and potassium.  April 17, 2012 She has been more fatigued recently.  She played golf this past weekend and still is not over the exertion.  Dec. 3, 2013 :  She is doing well.  Dr. Caryl Comes stopped her Pradaxa due to GI bleed.    She is doing very well.   January 15, 2013:  Donna Ortega is doing OK.  She does not have as much energy as she would like.   She has continued to have problems with hyponatremia.  She is on maxzide which is likely exacerbating this.  No CP, no dyspnea.    Dec. 1, 2014:  Donna Ortega is doing well.  No CP or dyspnea.  Rhythm is stable.   Her Hb was a bit low when last checked by her medical doctor.  Has not started on the iron tabs.   No CP, not playing golf any more because of back pain ( s/p kyphoplasty)  .  She is still walking around the block on occasion.   She is now in her own apartment.   Sept. 9, 2015:  Donna Ortega is doing well.  She is not having any problems   March 02, 2015: No CP , no dyspnea.   Feb. 13, 2017:  Fell 3 weeks ago .  Now is using a cane for stability  She has occasional episodes of atrial fib - last 5 minutes or so.   Is on Eliquis 2.5 BID  Has had some bladder issues   Oct. 6, 2017:  Donna Ortega is  seen back for her CAD and PAF  Has some leg swelling .  Was on Maxzide - this is off her list - perhaps because of hypotension . Lisinopril was stopped at that time    November 29, 2016:  Doing well, no angina  Knows that she is starting to forget things on occasion Has been having some swelling in her feet and ankles.  BP has been a bit higher She avoids salty foods.   Nov. 21, 2018:  Doing well.  No CP or dyspnea Had kyphoplasty this past summer.  Very active.    Still driving    Jan 02, 4097:  Donna Ortega is seen today for follow-up of her coronary artery disease and hx atrial fibrillation. Still driving - not driving to [facility] anymore  No CP or dyspnea   Current Outpatient Medications on File Prior to Visit  Medication Sig Dispense Refill  . acetaminophen (TYLENOL) 500 MG tablet Take 1,000 mg by mouth every 4 (four) hours as needed.    Marland Kitchen alendronate (FOSAMAX) 70 MG tablet Take 70 mg  by mouth once a week. Take on Thursday with a full glass of water on an empty stomach.    . Calcium Carbonate-Vitamin D (CALTRATE 600+D PO) Take 1 tablet by mouth daily.     Marland Kitchen diltiazem (CARDIZEM CD) 240 MG 24 hr capsule TAKE 1 CAPSULE (240 MG TOTAL) BY MOUTH DAILY. 30 capsule 5  . diphenhydramine-acetaminophen (TYLENOL PM) 25-500 MG TABS tablet Take 2 tablets by mouth at bedtime.    Marland Kitchen ELIQUIS 2.5 MG TABS tablet TAKE 1 TABLET BY MOUTH TWICE A DAY 180 tablet 3  . metoprolol tartrate (LOPRESSOR) 25 MG tablet Take 1 tablet (25 mg total) by mouth 2 (two) times daily. 180 tablet 3  . morphine (MSIR) 15 MG tablet Take 1 tablet (15 mg total) by mouth every 4 (four) hours as needed for severe pain. 7 tablet 0  . Multiple Vitamins-Minerals (CENTRUM SILVER ADULT 50+ PO) Take 1 tablet by mouth daily.    . pantoprazole (PROTONIX) 40 MG tablet TAKE 1 TABLET (40 MG TOTAL) BY MOUTH DAILY. 90 tablet 1  . rosuvastatin (CRESTOR) 10 MG tablet Take 1 tablet (10 mg total) by mouth daily. 90 tablet 3   No current  facility-administered medications on file prior to visit.     Allergies  Allergen Reactions  . Amiodarone Hives    ALL OVER BODY HIVES/ ITCH   . Aspirin Other (See Comments)    Gi bleed   . Lipitor [Atorvastatin Calcium] Other (See Comments)    MUSCLE ACHES  . Nsaids Other (See Comments)    GI bleed  . Tolmetin Other (See Comments)    GI bleed  . Naproxen Other (See Comments)  . Niacin Rash  . Niaspan [Niacin Er] Rash    Past Medical History:  Diagnosis Date  . Arthritis    osteo   in back  . Coronary artery disease 2007   3 vessel CABG with LIMA-LAD, SVG to diag, and SVG - Ramus  . Diverticulitis   . GI bleeding 09/09/2013  . Hearing loss of left ear 07/2013  . History of atrial fibrillation; review of all ECG are most consistent with atrial tachycardia    had rash with amiodarone; no anticoagulation due to GI bleed in December 2012  . History of GI bleed Dec 2012  . History of hyperkalemia   . Hyperlipidemia   . Hypertension   . Hyponatremia   . Pacemaker-Medtronic 02/09/2012  . Palpitations     Past Surgical History:  Procedure Laterality Date  . ABDOMINAL HYSTERECTOMY    . COLONOSCOPY N/A 09/12/2013   Procedure: COLONOSCOPY;  Surgeon: Wonda Horner, MD;  Location[facility] ENDOSCOPY;  Service: Endoscopy;  Laterality: N/A;  . CORONARY ARTERY BYPASS GRAFT  Feb 2007   Dr. Cyndia Bent;   . ESOPHAGOGASTRODUODENOSCOPY  08/04/2011   Procedure: ESOPHAGOGASTRODUODENOSCOPY (EGD);  Surgeon: Cleotis Nipper, MD;  Location[facility] ENDOSCOPY;  Service: Endoscopy;  Laterality: N/A;  do at bedside  . ESOPHAGOGASTRODUODENOSCOPY N/A 09/11/2013   Procedure: ESOPHAGOGASTRODUODENOSCOPY (EGD);  Surgeon: Jeryl Columbia, MD;  Location[facility] ENDOSCOPY;  Service: Endoscopy;  Laterality: N/A;  . IR KYPHO THORACIC WITH BONE BIOPSY  04/11/2017  . IR RADIOLOGIST EVAL & MGMT  04/04/2017  . MAZE    . pace maker    . PARTIAL HYSTERECTOMY    . PERMANENT PACEMAKER INSERTION N/A 02/08/2012   Procedure: PERMANENT  PACEMAKER INSERTION;  Surgeon: Deboraha Sprang, MD;  Location: [facility] CATH LAB;  Service: Cardiovascular;  Laterality: N/A;  Social History   Tobacco Use  Smoking Status Never Smoker  Smokeless Tobacco Never Used    Social History   Substance and Sexual Activity  Alcohol Use No    Family History  Problem Relation Age of Onset  . Heart failure Father   . Kidney failure Father   . Bone cancer Brother   . Kidney failure Sister   . Colon cancer Sister   . Cancer Sister     Reviw of Systems:  Reviewed in the HPI.  All other systems are negative.   Physical Exam: Blood pressure 110/82, pulse 77, height 5\' 3"  (1.6 m), weight 136 lb 12.8 oz (62.1 kg), SpO2 98 %.  GEN:  Well nourished, well developed in no acute distress HEENT: Normal NECK: No JVD; No carotid bruits LYMPHATICS: No lymphadenopathy CARDIAC: RRR , no murmurs, rubs, gallops RESPIRATORY:  Clear to auscultation without rales, wheezing or rhonchi  ABDOMEN: Soft, non-tender, non-distended MUSCULOSKELETAL:  No edema; No deformity  SKIN: Warm and dry NEUROLOGIC:  Alert and oriented x 3   ECG: Jan 02, 2018:   NSR with 77.  1st degree AV block    Assessment / Plan:   1. CAD - s/p CABG 2007,   -  Doing well.    2. Atrial fib:    Remains in NSR , contiue diltiazem    4. GI Bleed  -    Stable   5. Anemia -    6. Hyponatremia-  followed by her primary MD   7. Hyperlipidemia -  Recent labs by primary md look good     8. HTN:  .   BP is well controlled.   Will see her in 6 months   Mertie Moores, MD  01/02/2018 9:59 AM    [address],  [place], [place]  [address] Pager [phone] Phone: [phone]; Fax: [phone]

## 2018-01-05 LAB — CUP PACEART REMOTE DEVICE CHECK
Battery Voltage: 2.97 V
Brady Statistic AP VS Percent: 13.19 %
Brady Statistic AS VP Percent: 0.45 %
Brady Statistic RA Percent Paced: 12.6 %
Date Time Interrogation Session: 20190506111851
Implantable Lead Implant Date: 20130626
Implantable Lead Location: 753859
Implantable Pulse Generator Implant Date: 20130626
Lead Channel Sensing Intrinsic Amplitude: 4.856 mV
Lead Channel Setting Sensing Sensitivity: 0.9 mV
MDC IDC LEAD IMPLANT DT: 20130626
MDC IDC LEAD LOCATION: 753860
MDC IDC MSMT LEADCHNL RA IMPEDANCE VALUE: 392 Ohm
MDC IDC MSMT LEADCHNL RA SENSING INTR AMPL: 0.806 mV
MDC IDC MSMT LEADCHNL RV IMPEDANCE VALUE: 392 Ohm
MDC IDC SET LEADCHNL RA PACING AMPLITUDE: 2 V
MDC IDC SET LEADCHNL RV PACING AMPLITUDE: 2.5 V
MDC IDC SET LEADCHNL RV PACING PULSEWIDTH: 0.4 ms
MDC IDC STAT BRADY AP VP PERCENT: 0.2 %
MDC IDC STAT BRADY AS VS PERCENT: 86.17 %
MDC IDC STAT BRADY RV PERCENT PACED: 0.52 %

## 2018-01-15 DIAGNOSIS — M6281 Muscle weakness (generalized): Secondary | ICD-10-CM | POA: Diagnosis not present

## 2018-01-15 DIAGNOSIS — R3914 Feeling of incomplete bladder emptying: Secondary | ICD-10-CM | POA: Diagnosis not present

## 2018-01-15 DIAGNOSIS — R351 Nocturia: Secondary | ICD-10-CM | POA: Diagnosis not present

## 2018-01-15 DIAGNOSIS — M6289 Other specified disorders of muscle: Secondary | ICD-10-CM | POA: Diagnosis not present

## 2018-01-22 ENCOUNTER — Ambulatory Visit: Payer: Medicare HMO | Admitting: Physician Assistant

## 2018-01-22 ENCOUNTER — Telehealth: Payer: Self-pay

## 2018-01-22 ENCOUNTER — Encounter: Payer: Self-pay | Admitting: Physician Assistant

## 2018-01-22 VITALS — BP 132/72 | HR 85 | Ht 63.0 in | Wt 130.0 lb

## 2018-01-22 DIAGNOSIS — Z8679 Personal history of other diseases of the circulatory system: Secondary | ICD-10-CM

## 2018-01-22 DIAGNOSIS — I480 Paroxysmal atrial fibrillation: Secondary | ICD-10-CM | POA: Diagnosis not present

## 2018-01-22 DIAGNOSIS — I10 Essential (primary) hypertension: Secondary | ICD-10-CM

## 2018-01-22 NOTE — Progress Notes (Signed)
Cardiology Office Note    Date:  01/22/2018   ID:  Donna Ortega, DOB 1926-04-08, MRN [id number]  PCP:  Lanice Shirts, MD  Cardiologist:  Dr. Acie Fredrickson  Chief Complaint: Afib  History of Present Illness:   Donna Ortega is a 84 y.o. female CAD s/p CABG, HTN, HLD, PAF and prior GI bleed added to my schedule for tachycardia.   Hx of Drug rash on amiodarone. Last echo in 2015 showed LVEF of 55-60%, severely dilated LA, PA pressure of 70mm HG.   She was in sinus rhythm when last seen by Dr. Acie Fredrickson 01/02/18.  For the past two days patient has intermittent SOB with fatigue. Patient did not felt any palpitations. This morning her granddaughter noted HR of 113 with BP of 140/100. She took extra 25mg  of metoprolol as advised by Dr. Acie Fredrickson and added to my schedule. Since taking extra dose of metoprolol, she is feeling well. Dyspnea resolved, fatigue improved. EKG showed sinus rhythm. Has intermittent LE edema at home. She does eats food high in salt. No syncope, orthopnea, PND, chest pain or melena. She was treated for UTI above 3-4 weeks ago.   Past Medical History:  Diagnosis Date  . Arthritis    osteo   in back  . Coronary artery disease 2007   3 vessel CABG with LIMA-LAD, SVG to diag, and SVG - Ramus  . Diverticulitis   . GI bleeding 09/09/2013  . Hearing loss of left ear 07/2013  . History of atrial fibrillation; review of all ECG are most consistent with atrial tachycardia    had rash with amiodarone; no anticoagulation due to GI bleed in December 2012  . History of GI bleed Dec 2012  . History of hyperkalemia   . Hyperlipidemia   . Hypertension   . Hyponatremia   . Pacemaker-Medtronic 02/09/2012  . Palpitations     Past Surgical History:  Procedure Laterality Date  . ABDOMINAL HYSTERECTOMY    . COLONOSCOPY N/A 09/12/2013   Procedure: COLONOSCOPY;  Surgeon: Wonda Horner, MD;  Location[facility] ENDOSCOPY;  Service: Endoscopy;  Laterality: N/A;  . CORONARY ARTERY BYPASS GRAFT   Feb 2007   Dr. Cyndia Bent;   . ESOPHAGOGASTRODUODENOSCOPY  08/04/2011   Procedure: ESOPHAGOGASTRODUODENOSCOPY (EGD);  Surgeon: Cleotis Nipper, MD;  Location: [facility] ENDOSCOPY;  Service: Endoscopy;  Laterality: N/A;  do at bedside  . ESOPHAGOGASTRODUODENOSCOPY N/A 09/11/2013   Procedure: ESOPHAGOGASTRODUODENOSCOPY (EGD);  Surgeon: Jeryl Columbia, MD;  Location: [facility] ENDOSCOPY;  Service: Endoscopy;  Laterality: N/A;  . IR KYPHO THORACIC WITH BONE BIOPSY  04/11/2017  . IR RADIOLOGIST EVAL & MGMT  04/04/2017  . MAZE    . pace maker    . PARTIAL HYSTERECTOMY    . PERMANENT PACEMAKER INSERTION N/A 02/08/2012   Procedure: PERMANENT PACEMAKER INSERTION;  Surgeon: Deboraha Sprang, MD;  Location: [facility] CATH LAB;  Service: Cardiovascular;  Laterality: N/A;    Current Medications: Prior to Admission medications   Medication Sig Start Date End Date Taking? Authorizing Provider  acetaminophen (TYLENOL) 500 MG tablet Take 1,000 mg by mouth every 4 (four) hours as needed.    [provider]  Calcium Carbonate-Vitamin D (CALTRATE 600+D PO) Take 1 tablet by mouth daily.     [provider]  diltiazem (CARDIZEM CD) 240 MG 24 hr capsule TAKE 1 CAPSULE (240 MG TOTAL) BY MOUTH DAILY. 06/29/17   Nahser, Wonda Cheng, MD  diphenhydramine-acetaminophen (TYLENOL PM) 25-500 MG TABS tablet Take 2 tablets by  mouth at bedtime.    [provider]  ELIQUIS 2.5 MG TABS tablet TAKE 1 TABLET BY MOUTH TWICE A DAY 09/14/17   Nahser, Wonda Cheng, MD  metoprolol tartrate (LOPRESSOR) 25 MG tablet Take 1 tablet (25 mg total) by mouth 2 (two) times daily. 08/02/17   Nahser, Wonda Cheng, MD  morphine (MSIR) 15 MG tablet Take 1 tablet (15 mg total) by mouth every 4 (four) hours as needed for severe pain. 04/02/17   Charlynne Cousins, MD  Multiple Vitamins-Minerals (CENTRUM SILVER ADULT 50+ PO) Take 1 tablet by mouth daily.    [provider]  pantoprazole (PROTONIX) 40 MG tablet TAKE 1 TABLET (40 MG TOTAL) BY MOUTH DAILY.  09/17/14   Schoenhoff, Altamese Cabal, MD  rosuvastatin (CRESTOR) 10 MG tablet Take 1 tablet (10 mg total) by mouth daily. 07/10/17 10/08/17  Nahser, Wonda Cheng, MD    Allergies:   Amiodarone; Aspirin; Lipitor [atorvastatin calcium]; Nsaids; Tolmetin; Naproxen; Niacin; and Niaspan [niacin er]   Social History   Socioeconomic History  . Marital status: Widowed    Spouse name: Not on file  . Number of children: Not on file  . Years of education: Not on file  . Highest education level: Not on file  Occupational History  . Not on file  Social Needs  . Financial resource strain: Not on file  . Food insecurity:    Worry: Not on file    Inability: Not on file  . Transportation needs:    Medical: Not on file    Non-medical: Not on file  Tobacco Use  . Smoking status: Never Smoker  . Smokeless tobacco: Never Used  Substance and Sexual Activity  . Alcohol use: No  . Drug use: No  . Sexual activity: Never  Lifestyle  . Physical activity:    Days per week: Not on file    Minutes per session: Not on file  . Stress: Not on file  Relationships  . Social connections:    Talks on phone: Not on file    Gets together: Not on file    Attends religious service: Not on file    Active member of club or organization: Not on file    Attends meetings of clubs or organizations: Not on file    Relationship status: Not on file  Other Topics Concern  . Not on file  Social History Narrative   Ambulatory usually independently, plays golf   Lives at home with family   Daughter in law Maira Christon [phone]     Family History:  The patient's family history includes Bone cancer in her brother; Cancer in her sister; Colon cancer in her sister; Heart failure in her father; Kidney failure in her father and sister.   ROS:   Please see the history of present illness.    ROS All other systems reviewed and are negative.   PHYSICAL EXAM:   VS:  BP 132/72   Pulse 85   Ht 5\' 3"  (1.6 m)   Wt 130 lb (59 kg)    BMI 23.03 kg/m    GEN: Elderly female in no acute distress  HEENT: normal  Neck: no JVD, carotid bruits, or masses Cardiac: RRR; no murmurs, rubs, or gallops,no edema  Respiratory:  clear to auscultation bilaterally, normal work of breathing GI: soft, nontender, nondistended, + BS MS: no deformity or atrophy  Skin: warm and dry, no rash Neuro:  Alert and Oriented x 3, Strength and sensation are intact  Psych: euthymic mood, full affect  Wt Readings from Last 3 Encounters:  01/22/18 130 lb (59 kg)  01/02/18 136 lb 12.8 oz (62.1 kg)  07/05/17 134 lb 12.8 oz (61.1 kg)      Studies/Labs Reviewed:   EKG:  EKG is ordered today.  The ekg ordered today demonstrates NSR at rate of 85 bpm  Recent Labs: 03/31/2017: Magnesium 1.8 04/11/2017: Hemoglobin 13.1; Platelets 259 10/11/2017: ALT 13; BUN 20; Creatinine, Ser 0.97; Potassium 4.5; Sodium 134   Lipid Panel    Component Value Date/Time   CHOL 213 (H) 10/11/2017 0951   TRIG 88 10/11/2017 0951   HDL 109 10/11/2017 0951   CHOLHDL 2.0 10/11/2017 0951   CHOLHDL 2.4 09/28/2015 1116   VLDL 14 09/28/2015 1116   LDLCALC 86 10/11/2017 0951   LDLDIRECT 98.6 07/15/2013 1031    Additional studies/ records that were reviewed today include:   Echocardiogram: 02/2014 Study Conclusions  - Left ventricle: The cavity size was normal. Wall thickness was increased in a pattern of mild LVH. Systolic function was normal. The estimated ejection fraction was in the range of 55% to 60%. - Mitral valve: There was mild regurgitation. - Left atrium: The atrium was severely dilated. - Pulmonary arteries: PA peak pressure: 39 mm Hg (S).  ASSESSMENT & PLAN:    1. PAF - Patient feels fatigue and dyspnea when goes in afib. No palpitations. She converted to sinus rhythm after one extra dose of metoprolol. No specific etiology except tx for UTI few weeks ago. Will continue BB and CCB at current dose. She will get extra metoprolol as need. If goes in  afib frequently she will call us at that time.   2. Intermittent lower extremity edema - likely related to excess salt intake. Encouraged to cut back. euvolemic today.   3. Recurrent URI - Last tx few weeks ago. Asymptomatic currently. Followed by urologist.   Medication Adjustments/Labs and Tests Ordered: Current medicines are reviewed at length with the patient today.  Concerns regarding medicines are outlined above.  Medication changes, Labs and Tests ordered today are listed in the Patient Instructions below. Patient Instructions  Your physician recommends that you continue on your current medications as directed. Please refer to the Current Medication list given to you today.   Your physician recommends that you schedule a follow-up appointment in: AS SCHEDULED WITH DR Burnis Kingfisher, [place]  01/22/2018 10:52 AM    [address], [place], [place]  [address] Phone: [phone]; Fax: [phone]

## 2018-01-22 NOTE — Telephone Encounter (Signed)
-----   Message from Thayer Headings, MD sent at 01/22/2018  8:51 AM EDT ----- Good morning.  Ms. Brines is likely back in atrial fib this am Is there an opening with an APP or DOD to see her and schedule her for a cardioverson tomorrow   I could potentially see her as a work in some time - possibly at 10 am or at noon or at 3 PM  Call her daughter in [place] to schedule  [phone]  And text me back if that's going to work  Thanks [employer]

## 2018-01-22 NOTE — Telephone Encounter (Signed)
DCCV WAS CANCELLED PT WAS IN SINUS [email or web address]

## 2018-01-22 NOTE — Telephone Encounter (Signed)
Scheduled patient with B. Bhagat today at 1030.  Amy notified and agrees with treatment plan.   Per Dr. Acie Fredrickson, DCCV has already been scheduled for tomorrow at 1100.

## 2018-01-22 NOTE — Patient Instructions (Signed)
Your physician recommends that you continue on your current medications as directed. Please refer to the Current Medication list given to you today.   Your physician recommends that you schedule a follow-up appointment in: AS SCHEDULED WITH DR Acie Fredrickson

## 2018-01-23 ENCOUNTER — Ambulatory Visit (HOSPITAL_COMMUNITY): Admit: 2018-01-23 | Payer: Medicare HMO | Admitting: Cardiovascular Disease

## 2018-01-23 ENCOUNTER — Encounter (HOSPITAL_COMMUNITY): Payer: Self-pay

## 2018-01-23 SURGERY — CARDIOVERSION
Anesthesia: General

## 2018-01-26 ENCOUNTER — Other Ambulatory Visit (HOSPITAL_COMMUNITY): Payer: Self-pay | Admitting: Interventional Radiology

## 2018-01-26 DIAGNOSIS — M549 Dorsalgia, unspecified: Secondary | ICD-10-CM

## 2018-01-28 DIAGNOSIS — N390 Urinary tract infection, site not specified: Secondary | ICD-10-CM | POA: Diagnosis not present

## 2018-01-29 ENCOUNTER — Encounter (HOSPITAL_COMMUNITY): Payer: Self-pay | Admitting: Emergency Medicine

## 2018-01-29 ENCOUNTER — Inpatient Hospital Stay (HOSPITAL_COMMUNITY)
Admission: EM | Admit: 2018-01-29 | Discharge: 2018-02-08 | DRG: 643 | Disposition: A | Discharge status: Skilled Nursing Facility | Payer: Medicare HMO | Attending: Internal Medicine | Admitting: Internal Medicine

## 2018-01-29 ENCOUNTER — Emergency Department (HOSPITAL_COMMUNITY): Payer: Medicare HMO

## 2018-01-29 DIAGNOSIS — I480 Paroxysmal atrial fibrillation: Secondary | ICD-10-CM | POA: Diagnosis present

## 2018-01-29 DIAGNOSIS — M6281 Muscle weakness (generalized): Secondary | ICD-10-CM | POA: Diagnosis not present

## 2018-01-29 DIAGNOSIS — R5383 Other fatigue: Secondary | ICD-10-CM

## 2018-01-29 DIAGNOSIS — R410 Disorientation, unspecified: Secondary | ICD-10-CM | POA: Diagnosis not present

## 2018-01-29 DIAGNOSIS — H9192 Unspecified hearing loss, left ear: Secondary | ICD-10-CM | POA: Diagnosis present

## 2018-01-29 DIAGNOSIS — I482 Chronic atrial fibrillation, unspecified: Secondary | ICD-10-CM

## 2018-01-29 DIAGNOSIS — R269 Unspecified abnormalities of gait and mobility: Secondary | ICD-10-CM | POA: Diagnosis present

## 2018-01-29 DIAGNOSIS — Z6823 Body mass index (BMI) 23.0-23.9, adult: Secondary | ICD-10-CM

## 2018-01-29 DIAGNOSIS — Z888 Allergy status to other drugs, medicaments and biological substances status: Secondary | ICD-10-CM | POA: Diagnosis not present

## 2018-01-29 DIAGNOSIS — Z8249 Family history of ischemic heart disease and other diseases of the circulatory system: Secondary | ICD-10-CM

## 2018-01-29 DIAGNOSIS — N390 Urinary tract infection, site not specified: Secondary | ICD-10-CM | POA: Diagnosis present

## 2018-01-29 DIAGNOSIS — Z7901 Long term (current) use of anticoagulants: Secondary | ICD-10-CM | POA: Diagnosis not present

## 2018-01-29 DIAGNOSIS — E860 Dehydration: Secondary | ICD-10-CM | POA: Diagnosis present

## 2018-01-29 DIAGNOSIS — Z743 Need for continuous supervision: Secondary | ICD-10-CM | POA: Diagnosis not present

## 2018-01-29 DIAGNOSIS — E876 Hypokalemia: Secondary | ICD-10-CM | POA: Diagnosis not present

## 2018-01-29 DIAGNOSIS — E861 Hypovolemia: Secondary | ICD-10-CM

## 2018-01-29 DIAGNOSIS — Z886 Allergy status to analgesic agent status: Secondary | ICD-10-CM | POA: Diagnosis not present

## 2018-01-29 DIAGNOSIS — I1310 Hypertensive heart and chronic kidney disease without heart failure, with stage 1 through stage 4 chronic kidney disease, or unspecified chronic kidney disease: Secondary | ICD-10-CM | POA: Diagnosis present

## 2018-01-29 DIAGNOSIS — F0390 Unspecified dementia without behavioral disturbance: Secondary | ICD-10-CM | POA: Diagnosis not present

## 2018-01-29 DIAGNOSIS — Z79899 Other long term (current) drug therapy: Secondary | ICD-10-CM

## 2018-01-29 DIAGNOSIS — Z66 Do not resuscitate: Secondary | ICD-10-CM | POA: Diagnosis present

## 2018-01-29 DIAGNOSIS — N179 Acute kidney failure, unspecified: Secondary | ICD-10-CM | POA: Diagnosis not present

## 2018-01-29 DIAGNOSIS — E871 Hypo-osmolality and hyponatremia: Secondary | ICD-10-CM | POA: Diagnosis not present

## 2018-01-29 DIAGNOSIS — E222 Syndrome of inappropriate secretion of antidiuretic hormone: Principal | ICD-10-CM | POA: Diagnosis present

## 2018-01-29 DIAGNOSIS — R279 Unspecified lack of coordination: Secondary | ICD-10-CM | POA: Diagnosis not present

## 2018-01-29 DIAGNOSIS — R2689 Other abnormalities of gait and mobility: Secondary | ICD-10-CM | POA: Diagnosis not present

## 2018-01-29 DIAGNOSIS — N182 Chronic kidney disease, stage 2 (mild): Secondary | ICD-10-CM | POA: Diagnosis present

## 2018-01-29 DIAGNOSIS — G9341 Metabolic encephalopathy: Secondary | ICD-10-CM | POA: Diagnosis present

## 2018-01-29 DIAGNOSIS — E785 Hyperlipidemia, unspecified: Secondary | ICD-10-CM | POA: Diagnosis present

## 2018-01-29 DIAGNOSIS — R69 Illness, unspecified: Secondary | ICD-10-CM | POA: Diagnosis not present

## 2018-01-29 DIAGNOSIS — I4581 Long QT syndrome: Secondary | ICD-10-CM | POA: Diagnosis not present

## 2018-01-29 DIAGNOSIS — K219 Gastro-esophageal reflux disease without esophagitis: Secondary | ICD-10-CM | POA: Diagnosis present

## 2018-01-29 DIAGNOSIS — I10 Essential (primary) hypertension: Secondary | ICD-10-CM | POA: Diagnosis not present

## 2018-01-29 DIAGNOSIS — R627 Adult failure to thrive: Secondary | ICD-10-CM | POA: Diagnosis not present

## 2018-01-29 DIAGNOSIS — Z950 Presence of cardiac pacemaker: Secondary | ICD-10-CM

## 2018-01-29 DIAGNOSIS — I517 Cardiomegaly: Secondary | ICD-10-CM | POA: Diagnosis not present

## 2018-01-29 DIAGNOSIS — R41841 Cognitive communication deficit: Secondary | ICD-10-CM | POA: Diagnosis not present

## 2018-01-29 DIAGNOSIS — I2510 Atherosclerotic heart disease of native coronary artery without angina pectoris: Secondary | ICD-10-CM | POA: Diagnosis present

## 2018-01-29 DIAGNOSIS — I4891 Unspecified atrial fibrillation: Secondary | ICD-10-CM | POA: Diagnosis not present

## 2018-01-29 DIAGNOSIS — R739 Hyperglycemia, unspecified: Secondary | ICD-10-CM | POA: Diagnosis not present

## 2018-01-29 LAB — CBC WITH DIFFERENTIAL/PLATELET
BASOS ABS: 0 10*3/uL (ref 0.0–0.1)
BASOS PCT: 0 %
EOS ABS: 0 10*3/uL (ref 0.0–0.7)
EOS PCT: 0 %
HCT: 38.1 % (ref 36.0–46.0)
Hemoglobin: 14.1 g/dL (ref 12.0–15.0)
LYMPHS ABS: 1.2 10*3/uL (ref 0.7–4.0)
Lymphocytes Relative: 13 %
MCH: 31.8 pg (ref 26.0–34.0)
MCHC: 37 g/dL — ABNORMAL HIGH (ref 30.0–36.0)
MCV: 85.8 fL (ref 78.0–100.0)
Monocytes Absolute: 0.8 10*3/uL (ref 0.1–1.0)
Monocytes Relative: 9 %
NEUTROS PCT: 78 %
Neutro Abs: 6.9 10*3/uL (ref 1.7–7.7)
PLATELETS: 248 10*3/uL (ref 150–400)
RBC: 4.44 MIL/uL (ref 3.87–5.11)
RDW: 12.1 % (ref 11.5–15.5)
WBC: 8.9 10*3/uL (ref 4.0–10.5)

## 2018-01-29 LAB — PROTIME-INR
INR: 0.96
PROTHROMBIN TIME: 12.7 s (ref 11.4–15.2)

## 2018-01-29 LAB — URINALYSIS, ROUTINE W REFLEX MICROSCOPIC
BILIRUBIN URINE: NEGATIVE
BILIRUBIN URINE: NEGATIVE
GLUCOSE, UA: NEGATIVE mg/dL
Glucose, UA: NEGATIVE mg/dL
Hgb urine dipstick: NEGATIVE
Hgb urine dipstick: NEGATIVE
KETONES UR: 5 mg/dL — AB
KETONES UR: 5 mg/dL — AB
Leukocytes, UA: NEGATIVE
Leukocytes, UA: NEGATIVE
NITRITE: NEGATIVE
Nitrite: NEGATIVE
PH: 7 (ref 5.0–8.0)
PROTEIN: NEGATIVE mg/dL
PROTEIN: NEGATIVE mg/dL
Specific Gravity, Urine: 1.01 (ref 1.005–1.030)
Specific Gravity, Urine: 1.01 (ref 1.005–1.030)
pH: 7 (ref 5.0–8.0)

## 2018-01-29 LAB — BASIC METABOLIC PANEL
ANION GAP: 7 (ref 5–15)
BUN: 24 mg/dL — AB (ref 6–20)
CALCIUM: 8.2 mg/dL — AB (ref 8.9–10.3)
CO2: 24 mmol/L (ref 22–32)
Chloride: 85 mmol/L — ABNORMAL LOW (ref 101–111)
Creatinine, Ser: 0.71 mg/dL (ref 0.44–1.00)
GFR calc Af Amer: >60 mL/min (ref >60)
GFR calc non Af Amer: >60 mL/min (ref >60)
GLUCOSE: 125 mg/dL — AB (ref 65–99)
Potassium: 3.7 mmol/L (ref 3.5–5.1)
Sodium: 116 mmol/L — CL (ref 135–145)

## 2018-01-29 LAB — COMPREHENSIVE METABOLIC PANEL
ALK PHOS: 54 U/L (ref 38–126)
ALT: 19 U/L (ref 14–54)
ANION GAP: 13 (ref 5–15)
AST: 31 U/L (ref 15–41)
Albumin: 4.3 g/dL (ref 3.5–5.0)
BUN: 29 mg/dL — ABNORMAL HIGH (ref 6–20)
CALCIUM: 8.9 mg/dL (ref 8.9–10.3)
CO2: 22 mmol/L (ref 22–32)
Chloride: 78 mmol/L — ABNORMAL LOW (ref 101–111)
Creatinine, Ser: 1.01 mg/dL — ABNORMAL HIGH (ref 0.44–1.00)
GFR, EST AFRICAN AMERICAN: 55 mL/min — AB (ref >60)
GFR, EST NON AFRICAN AMERICAN: 48 mL/min — AB (ref >60)
Glucose, Bld: 128 mg/dL — ABNORMAL HIGH (ref 65–99)
Potassium: 4 mmol/L (ref 3.5–5.1)
SODIUM: 113 mmol/L — AB (ref 135–145)
Total Bilirubin: 0.9 mg/dL (ref 0.3–1.2)
Total Protein: 7.2 g/dL (ref 6.5–8.1)

## 2018-01-29 LAB — OSMOLALITY: Osmolality: 254 mOsm/kg — ABNORMAL LOW (ref 275–295)

## 2018-01-29 LAB — I-STAT CG4 LACTIC ACID, ED: LACTIC ACID, VENOUS: 1.68 mmol/L (ref 0.5–1.9)

## 2018-01-29 LAB — OSMOLALITY, URINE: Osmolality, Ur: 316 mOsm/kg (ref 300–900)

## 2018-01-29 LAB — I-STAT TROPONIN, ED: Troponin i, poc: 0 ng/mL (ref 0.00–0.08)

## 2018-01-29 LAB — CBG MONITORING, ED: GLUCOSE-CAPILLARY: 147 mg/dL — AB (ref 65–99)

## 2018-01-29 LAB — SODIUM, URINE, RANDOM: Sodium, Ur: 42 mmol/L

## 2018-01-29 LAB — MRSA PCR SCREENING: MRSA by PCR: NEGATIVE

## 2018-01-29 MED ORDER — SODIUM CHLORIDE 0.9 % IV BOLUS
500.0000 mL | Freq: Once | INTRAVENOUS | Status: AC
  Administered 2018-01-29: 500 mL via INTRAVENOUS

## 2018-01-29 MED ORDER — SODIUM CHLORIDE 0.9 % IV SOLN: INTRAVENOUS | Status: AC

## 2018-01-29 MED ORDER — METOPROLOL TARTRATE 25 MG PO TABS
25.0000 mg | ORAL_TABLET | Freq: Two times a day (BID) | ORAL | Status: DC
  Administered 2018-01-29 – 2018-01-30 (×2): 25 mg via ORAL
  Filled 2018-01-29 (×2): qty 1

## 2018-01-29 MED ORDER — PANTOPRAZOLE SODIUM 40 MG PO TBEC
40.0000 mg | DELAYED_RELEASE_TABLET | Freq: Every day | ORAL | Status: DC
  Administered 2018-01-30 – 2018-02-08 (×10): 40 mg via ORAL
  Filled 2018-01-29 (×10): qty 1

## 2018-01-29 MED ORDER — ROSUVASTATIN CALCIUM 10 MG PO TABS
10.0000 mg | ORAL_TABLET | Freq: Every day | ORAL | Status: DC
  Administered 2018-01-30 – 2018-02-08 (×10): 10 mg via ORAL
  Filled 2018-01-29 (×10): qty 1

## 2018-01-29 MED ORDER — DILTIAZEM HCL ER COATED BEADS 240 MG PO CP24
240.0000 mg | ORAL_CAPSULE | Freq: Every day | ORAL | Status: DC
  Administered 2018-01-30: 240 mg via ORAL
  Filled 2018-01-29: qty 2
  Filled 2018-01-29 (×2): qty 1

## 2018-01-29 MED ORDER — APIXABAN 2.5 MG PO TABS
2.5000 mg | ORAL_TABLET | Freq: Two times a day (BID) | ORAL | Status: DC
  Administered 2018-01-29 – 2018-02-08 (×20): 2.5 mg via ORAL
  Filled 2018-01-29 (×20): qty 1

## 2018-01-29 MED ORDER — TRAMADOL HCL 50 MG PO TABS
50.0000 mg | ORAL_TABLET | Freq: Once | ORAL | Status: AC | PRN
  Administered 2018-01-30: 50 mg via ORAL
  Filled 2018-01-29: qty 1

## 2018-01-29 MED ORDER — ENOXAPARIN SODIUM 30 MG/0.3ML ~~LOC~~ SOLN: 30.0000 mg | SUBCUTANEOUS | Status: DC

## 2018-01-29 MED ORDER — ACETAMINOPHEN 325 MG PO TABS
650.0000 mg | ORAL_TABLET | Freq: Four times a day (QID) | ORAL | Status: DC | PRN
  Administered 2018-01-29 – 2018-02-06 (×5): 650 mg via ORAL
  Filled 2018-01-29 (×5): qty 2

## 2018-01-29 MED ORDER — SODIUM CHLORIDE 0.9 % IV SOLN
Freq: Once | INTRAVENOUS | Status: AC
  Administered 2018-01-29: 15:00:00 via INTRAVENOUS

## 2018-01-29 MED ORDER — SODIUM CHLORIDE 0.9 % IV SOLN: INTRAVENOUS | Status: DC

## 2018-01-29 MED ORDER — DIPHENHYDRAMINE HCL 25 MG PO CAPS
25.0000 mg | ORAL_CAPSULE | Freq: Every evening | ORAL | Status: DC | PRN
  Administered 2018-01-29 – 2018-02-02 (×5): 25 mg via ORAL
  Filled 2018-01-29 (×5): qty 1

## 2018-01-29 NOTE — ED Provider Notes (Signed)
Medical screening examination/treatment/procedure(s) were conducted as a shared visit with non-physician practitioner(s) and myself.  I personally evaluated the patient during the encounter.  None 84 year old female here with increasing altered mental status x4 days.  History of similar symptoms associated with hyponatremia.  Lower care shows a sodium of 113.  Will start IV fluids and patient will be admitted to the hospital   Lacretia Leigh, MD 01/29/18 1439

## 2018-01-29 NOTE — ED Provider Notes (Signed)
[facility] DEPT Provider Note   CSN: [id number] Arrival date & time: 01/29/18  1300     History   Chief Complaint Chief Complaint  Patient presents with  . Altered Mental Status    HPI Donna Ortega is a 84 y.o. female.  HPI  84 y.o. female with a hx of CAD, Afib on Eliquis, HTN, HLD, Pacemaker-Medtronic, presents to the Emergency Department today due to altered mental status. Patient's daughter states that over the past 4 days she has been noticing increase in lethargy as well as feeling her mother not looking like herself. They went to an urgent care yesterday and was diagnosed with a UTI. Patient is given prescription of antibiotic has taken 3 doses. Nose worsening symptoms this morning decided come to the ER today. Patient denies any pain currently. No chest pain or shortness of breath. No abdominal pain. No nausea, vomiting, diarrhea. No fevers. Denies any headache or visual changes. No numbness. No unilateral weakness. Patient states she just does not feel like herself feels weaker than normal.  No other symptoms noted   Past Medical History:  Diagnosis Date  . Arthritis    osteo   in back  . Coronary artery disease 2007   3 vessel CABG with LIMA-LAD, SVG to diag, and SVG - Ramus  . Diverticulitis   . GI bleeding 09/09/2013  . Hearing loss of left ear 07/2013  . History of atrial fibrillation; review of all ECG are most consistent with atrial tachycardia    had rash with amiodarone; no anticoagulation due to GI bleed in December 2012  . History of GI bleed Dec 2012  . History of hyperkalemia   . Hyperlipidemia   . Hypertension   . Hyponatremia   . Pacemaker-Medtronic 02/09/2012  . Palpitations     Patient Active Problem List   Diagnosis Date Noted  . AKI (acute kidney injury) ([place]) 04/01/2017  . Small bowel obstruction ([place]) 03/31/2017  . Edema 04/09/2014  . Vertebral compression fracture ([place]) 10/29/2013  . Compressed spine fracture  ([place]) 10/29/2013  . Gastric erosions 10/09/2013  . Decreased hearing of both ears 10/09/2013  . Normocytic anemia 06/26/2013  . Osteoporosis  Fosamax restarted 10/2013 06/18/2013  . Osteopenia 05/05/2013  . Palpitation 02/19/2013  . Insomnia 02/18/2013  . Candida esophagitis ([place]) 01/22/2013  . Gastric ulcer 01/22/2013  . UTI (urinary tract infection) 01/17/2013  . HLD (hyperlipidemia) 06/19/2012  . Atrial tachycardia ([place]) 05/22/2012  . Pacemaker-Medtronic 02/09/2012  . Sinus node dysfunction ([place]) 02/06/2012  . Syncope and collapse 02/06/2012  . Benign hypertension 09/27/2011  . Rash 09/19/2011  . UTI (lower urinary tract infection) 08/21/2011  . Fatigue 08/21/2011  . Peptic ulcer disease with hemorrhage 08/14/2011  . S/P vertebroplasty 08/14/2011  . H/O Spinal surgery 08/14/2011  . Chronic kidney disease (CKD), stage III (moderate) ([place]) 08/05/2011  . Anemia associated with acute blood loss 08/04/2011  . Hyponatremia 08/04/2011  . GI bleed 08/04/2011  . Dyslipidemia 08/04/2011  . Melena 08/03/2011  . CAD (coronary artery disease) s/p CABG 01/11/2011  . Atrial fibrillation/flutter 01/11/2011  . HTN (hypertension) 01/11/2011  . Arteriosclerosis of coronary artery 01/11/2011    Past Surgical History:  Procedure Laterality Date  . ABDOMINAL HYSTERECTOMY    . COLONOSCOPY N/A 09/12/2013   Procedure: COLONOSCOPY;  Surgeon: Wonda Horner, MD;  Location: [facility] ENDOSCOPY;  Service: Endoscopy;  Laterality: N/A;  . CORONARY ARTERY BYPASS GRAFT  Feb 2007   Dr. Cyndia Bent;   Marland Kitchen  ESOPHAGOGASTRODUODENOSCOPY  08/04/2011   Procedure: ESOPHAGOGASTRODUODENOSCOPY (EGD);  Surgeon: Cleotis Nipper, MD;  Location: [facility] ENDOSCOPY;  Service: Endoscopy;  Laterality: N/A;  do at bedside  . ESOPHAGOGASTRODUODENOSCOPY N/A 09/11/2013   Procedure: ESOPHAGOGASTRODUODENOSCOPY (EGD);  Surgeon: Jeryl Columbia, MD;  Location[facility] ENDOSCOPY;  Service: Endoscopy;  Laterality: N/A;  . IR KYPHO THORACIC WITH BONE BIOPSY   04/11/2017  . IR RADIOLOGIST EVAL & MGMT  04/04/2017  . MAZE    . pace maker    . PARTIAL HYSTERECTOMY    . PERMANENT PACEMAKER INSERTION N/A 02/08/2012   Procedure: PERMANENT PACEMAKER INSERTION;  Surgeon: Deboraha Sprang, MD;  Location: [facility] CATH LAB;  Service: Cardiovascular;  Laterality: N/A;     OB History   None      Home Medications    Prior to Admission medications   Medication Sig Start Date End Date Taking? Authorizing Provider  acetaminophen (TYLENOL) 500 MG tablet Take 1,000 mg by mouth every 4 (four) hours as needed.    [provider]  Calcium Carbonate-Vitamin D (CALTRATE 600+D PO) Take 1 tablet by mouth daily.     [provider]  diltiazem (CARDIZEM CD) 240 MG 24 hr capsule TAKE 1 CAPSULE (240 MG TOTAL) BY MOUTH DAILY. 06/29/17   Nahser, Wonda Cheng, MD  diphenhydramine-acetaminophen (TYLENOL PM) 25-500 MG TABS tablet Take 2 tablets by mouth at bedtime.    [provider]  ELIQUIS 2.5 MG TABS tablet TAKE 1 TABLET BY MOUTH TWICE A DAY 09/14/17   Nahser, Wonda Cheng, MD  metoprolol tartrate (LOPRESSOR) 25 MG tablet Take 1 tablet (25 mg total) by mouth 2 (two) times daily. 08/02/17   Nahser, Wonda Cheng, MD  Multiple Vitamins-Minerals (CENTRUM SILVER ADULT 50+ PO) Take 1 tablet by mouth daily.    [provider]  pantoprazole (PROTONIX) 40 MG tablet TAKE 1 TABLET (40 MG TOTAL) BY MOUTH DAILY. 09/17/14   Schoenhoff, Altamese Cabal, MD  rosuvastatin (CRESTOR) 10 MG tablet Take 1 tablet (10 mg total) by mouth daily. 07/10/17   Nahser, Wonda Cheng, MD    Family History Family History  Problem Relation Age of Onset  . Heart failure Father   . Kidney failure Father   . Bone cancer Brother   . Kidney failure Sister   . Colon cancer Sister   . Cancer Sister     Social History Social History   Tobacco Use  . Smoking status: Never Smoker  . Smokeless tobacco: Never Used  Substance Use Topics  . Alcohol use: No  . Drug use: No     Allergies     Amiodarone; Aspirin; Lipitor [atorvastatin calcium]; Nsaids; Tolmetin; Naproxen; Niacin; and Niaspan [niacin er]   Review of Systems Review of Systems  ROS reviewed and all are negative for acute change except as noted in the HPI.  Physical Exam Updated Vital Signs BP 107/65   Pulse 95   Temp (!) 97.5 F (36.4 C) (Oral)   Resp 18   Ht 5\' 3"  (1.6 m)   Wt 59 kg (130 lb)   SpO2 99%   BMI 23.03 kg/m   Physical Exam  Constitutional: She is oriented to person, place, and time. Vital signs are normal. She appears well-developed and well-nourished. No distress.  HENT:  Head: Normocephalic and atraumatic. Head is without raccoon's eyes and without Battle's sign.  Right Ear: Tympanic membrane, external ear and ear canal normal. No hemotympanum.  Left Ear: Tympanic membrane, external ear and ear canal normal. No  hemotympanum.  Nose: Nose normal.  Mouth/Throat: Uvula is midline, oropharynx is clear and moist and mucous membranes are normal. No trismus in the jaw. No oropharyngeal exudate, posterior oropharyngeal erythema or tonsillar abscesses.  Eyes: Pupils are equal, round, and reactive to light. EOM are normal.  Neck: Trachea normal and normal range of motion. Neck supple. No spinous process tenderness and no muscular tenderness present. No tracheal deviation and normal range of motion present.  Cardiovascular: Normal rate, regular rhythm, S1 normal, S2 normal, normal heart sounds, intact distal pulses and normal pulses.  Pulmonary/Chest: Effort normal and breath sounds normal. No respiratory distress. She has no decreased breath sounds. She has no wheezes. She has no rhonchi. She has no rales.  Abdominal: Normal appearance and bowel sounds are normal. There is no tenderness. There is no rigidity, no rebound, no guarding, no CVA tenderness, no tenderness at McBurney's point and negative Murphy's sign.  Musculoskeletal: Normal range of motion.  Neurological: She is alert and oriented to  person, place, and time. She has normal strength. No cranial nerve deficit or sensory deficit.  Skin: Skin is warm and dry.  Psychiatric: She has a normal mood and affect. Her speech is normal and behavior is normal. Thought content normal.  Nursing note and vitals reviewed.   ED Treatments / Results  Labs (all labs ordered are listed, but only abnormal results are displayed) Labs Reviewed  COMPREHENSIVE METABOLIC PANEL - Abnormal; Notable for the following components:      Result Value   Sodium 113 (*)    Chloride 78 (*)    Glucose, Bld 128 (*)    BUN 29 (*)    Creatinine, Ser 1.01 (*)    GFR calc non Af Amer 48 (*)    GFR calc Af Amer 55 (*)    All other components within normal limits  CBC WITH DIFFERENTIAL/PLATELET - Abnormal; Notable for the following components:   MCHC 37.0 (*)    All other components within normal limits  CBG MONITORING, ED - Abnormal; Notable for the following components:   Glucose-Capillary 147 (*)    All other components within normal limits  CULTURE, BLOOD (ROUTINE X 2)  CULTURE, BLOOD (ROUTINE X 2)  PROTIME-INR  URINALYSIS, ROUTINE W REFLEX MICROSCOPIC  I-STAT CG4 LACTIC ACID, ED  I-STAT TROPONIN, ED    EKG None  Radiology No results found.  Procedures Procedures (including critical care time) CRITICAL CARE Performed by: Ozella Rocks   Total critical care time: 35 minutes  Critical care time was exclusive of separately billable procedures and treating other patients.  Critical care was necessary to treat or prevent imminent or life-threatening deterioration.  Critical care was time spent personally by me on the following activities: development of treatment plan with patient and/or surrogate as well as nursing, discussions with consultants, evaluation of patient's response to treatment, examination of patient, obtaining history from patient or surrogate, ordering and performing treatments and interventions, ordering and review of  laboratory studies, ordering and review of radiographic studies, pulse oximetry and re-evaluation of patient's condition.   Medications Ordered in ED Medications  0.9 %  sodium chloride infusion (has no administration in time range)  sodium chloride 0.9 % bolus 500 mL (500 mLs Intravenous New Bag/Given 01/29/18 1357)    Initial Impression / Assessment and Plan / ED Course  I have reviewed the triage vital signs and the nursing notes.  Pertinent labs & imaging results that were available during my care of the patient  were reviewed by me and considered in my medical decision making (see chart for details).   Final Clinical Impressions(s) / ED Diagnoses  {I have reviewed and evaluated the relevant laboratory values. {I have reviewed and evaluated the relevant imaging studies. {I have interpreted the relevant EKG. {I have reviewed the relevant previous healthcare records.  {I obtained HPI from historian. {Patient discussed with supervising physician.  ED Course:  Assessment: Pt is a 84 y.o. female with a hx of CAD, Afib on Eliquis, HTN, HLD, Pacemaker-Medtronic, presents to the Emergency Department today due to altered mental status. Patient's daughter states that over the past 4 days she has been noticing increase in lethargy as well as feeling her mother not looking like herself. They went to an urgent care yesterday and was diagnosed with a UTI. Patient is given prescription of antibiotic has taken 3 doses. Nose worsening symptoms this morning decided come to the ER today. Patient denies any pain currently. No chest pain or shortness of breath. No abdominal pain. No nausea, vomiting, diarrhea. No fevers. Denies any headache or visual changes. No numbness. No unilateral weakness. Patient states she just does not feel like herself feels weaker than normal. Daughter states that sometimes she'll have low sodium with the symptoms. On exam, pt in NAD. Nontoxic/nonseptic appearing. Patient on initial  presentation in triage with temperature 97.18F. Heart rate 101. Blood pressure 77/53. On my evaluation repeat blood pressure 107/65 without fluids. Heart rate 95. Afebrile. Lungs CTA. Heart RRR. Abdomen nontender soft. Patient alert and oriented and answering questions appropriately. EKG unremarkable. Lactic 1.68. CBC without elevated leukocytosis. CMP with sodium 113. Placed on sodium infusion. Trop negative. CXR unremarkable. UA pending. Seen by attending physician.   Plan is to [facility].   Disposition/Plan:  Admit Pt acknowledges and agrees with plan  Supervising Physician Lacretia Leigh, MD  Final diagnoses:  Hypovolemia    ED Discharge Orders    None       Shary Decamp, PA-C 01/29/18 1441    Lacretia Leigh, MD 01/31/18 1238

## 2018-01-29 NOTE — ED Notes (Signed)
ED TO INPATIENT HANDOFF REPORT  Name/Age/Gender Donna Ortega 84 y.o. female  Code Status    Code Status Orders  (From admission, onward)        Start     Ordered   01/29/18 1719  Do not attempt resuscitation (DNR)  Continuous    Question Answer Comment  In the event of cardiac or respiratory ARREST Do not call a "code blue"   In the event of cardiac or respiratory ARREST Do not perform Intubation, CPR, defibrillation or ACLS   In the event of cardiac or respiratory ARREST Use medication by any route, position, wound care, and other measures to relive pain and suffering. May use oxygen, suction and manual treatment of airway obstruction as needed for comfort.      01/29/18 1719    Code Status History    Date Active Date Inactive Code Status Order ID Comments User Context   03/31/2017 1303 04/02/2017 1734 DNR [id number]  Georgette Shell, MD ED   09/10/2013 1648 09/12/2013 1857 Full Code [id number]  Elmarie Shiley, MD Inpatient   08/21/2011 0146 08/25/2011 1751 DNR [id number]  Alanda Slim, RN Inpatient    Advance Directive Documentation     Most Recent Value  Type of Advance Directive  Healthcare Power of Attorney, Living will  Pre-existing out of facility DNR order (yellow form or pink MOST form)  -  "MOST" Form in Place?  -      Home/SNF/Other Home  Chief Complaint mental status altered  Level of Care/Admitting Diagnosis ED Disposition    ED Disposition Condition [place]: [facility] [100102]  Level of Care: Stepdown [14]  Admit to SDU based on following criteria: Hemodynamic compromise or significant risk of instability:  Patient requiring short term acute titration and management of vasoactive drips, and invasive monitoring (i.e., CVP and Arterial line).  Diagnosis: Hyponatremia [500370]  Admitting Physician: Georgette Shell [[id number]]  Attending Physician: Georgette Shell [[id number]]  Estimated length of  stay: 3 - 4 days  Certification:: I certify this patient will need inpatient services for at least 2 midnights  PT Class (Do Not Modify): Inpatient [101]  PT Acc Code (Do Not Modify): Private [1]       Medical History Past Medical History:  Diagnosis Date  . Arthritis    osteo   in back  . Coronary artery disease 2007   3 vessel CABG with LIMA-LAD, SVG to diag, and SVG - Ramus  . Diverticulitis   . GI bleeding 09/09/2013  . Hearing loss of left ear 07/2013  . History of atrial fibrillation; review of all ECG are most consistent with atrial tachycardia    had rash with amiodarone; no anticoagulation due to GI bleed in December 2012  . History of GI bleed Dec 2012  . History of hyperkalemia   . Hyperlipidemia   . Hypertension   . Hyponatremia   . Pacemaker-Medtronic 02/09/2012  . Palpitations     Allergies Allergies  Allergen Reactions  . Amiodarone Hives    ALL OVER BODY HIVES/ ITCH   . Aspirin Other (See Comments)    Gi bleed   . Lipitor [Atorvastatin Calcium] Other (See Comments)    MUSCLE ACHES  . Nsaids Other (See Comments)    GI bleed  . Tolmetin Other (See Comments)    GI bleed  . Naproxen Other (See Comments)  . Niacin Rash  . Niaspan [Niacin Er] Rash  IV Location/Drains/Wounds Patient Lines/Drains/Airways Status   Active Line/Drains/Airways    Name:   Placement date:   Placement time:   Site:   Days:   Peripheral IV 01/29/18 Right Hand   01/29/18    1344    Hand   less than 1   Peripheral IV 01/29/18 Left Antecubital   01/29/18    1428    Antecubital   less than 1          Labs/Imaging Results for orders placed or performed during the hospital encounter of 01/29/18 (from the past 48 hour(s))  CBG monitoring, ED     Status: Abnormal   Collection Time: 01/29/18  1:23 PM  Result Value Ref Range   Glucose-Capillary 147 (H) 65 - 99 mg/dL  Comprehensive metabolic panel     Status: Abnormal   Collection Time: 01/29/18  1:43 PM  Result Value Ref  Range   Sodium 113 (LL) 135 - 145 mmol/L    Comment: CRITICAL RESULT CALLED TO, READ BACK BY AND VERIFIED WITH: WEST,S. RN AT 1434 01/29/18 MULLINS,T    Potassium 4.0 3.5 - 5.1 mmol/L   Chloride 78 (L) 101 - 111 mmol/L   CO2 22 22 - 32 mmol/L   Glucose, Bld 128 (H) 65 - 99 mg/dL   BUN 29 (H) 6 - 20 mg/dL   Creatinine, Ser 1.01 (H) 0.44 - 1.00 mg/dL   Calcium 8.9 8.9 - 10.3 mg/dL   Total Protein 7.2 6.5 - 8.1 g/dL   Albumin 4.3 3.5 - 5.0 g/dL   AST 31 15 - 41 U/L   ALT 19 14 - 54 U/L   Alkaline Phosphatase 54 38 - 126 U/L   Total Bilirubin 0.9 0.3 - 1.2 mg/dL   GFR calc non Af Amer 48 (L) >60 mL/min   GFR calc Af Amer 55 (L) >60 mL/min    Comment: (NOTE) The eGFR has been calculated using the CKD EPI equation. This calculation has not been validated in all clinical situations. eGFR's persistently <60 mL/min signify possible Chronic Kidney Disease.    Anion gap 13 5 - 15    Comment: Performed at [facility], [place] [address] [place], [place] [address]  CBC with Differential     Status: Abnormal   Collection Time: 01/29/18  1:43 PM  Result Value Ref Range   WBC 8.9 4.0 - 10.5 K/uL   RBC 4.44 3.87 - 5.11 MIL/uL   Hemoglobin 14.1 12.0 - 15.0 g/dL   HCT 38.1 36.0 - 46.0 %   MCV 85.8 78.0 - 100.0 fL   MCH 31.8 26.0 - 34.0 pg   MCHC 37.0 (H) 30.0 - 36.0 g/dL   RDW 12.1 11.5 - 15.5 %   Platelets 248 150 - 400 K/uL   Neutrophils Relative % 78 %   Neutro Abs 6.9 1.7 - 7.7 K/uL   Lymphocytes Relative 13 %   Lymphs Abs 1.2 0.7 - 4.0 K/uL   Monocytes Relative 9 %   Monocytes Absolute 0.8 0.1 - 1.0 K/uL   Eosinophils Relative 0 %   Eosinophils Absolute 0.0 0.0 - 0.7 K/uL   Basophils Relative 0 %   Basophils Absolute 0.0 0.0 - 0.1 K/uL    Comment: Performed at [facility], [place] [address], [place], [place] [address]  Protime-INR     Status: None   Collection Time: 01/29/18  1:43 PM  Result Value Ref Range   Prothrombin Time 12.7 11.4 - 15.2  seconds  INR 0.96     Comment: Performed at [facility], [place] [address], [place], [place] [address]  I-Stat CG4 Lactic Acid, ED     Status: None   Collection Time: 01/29/18  1:45 PM  Result Value Ref Range   Lactic Acid, Venous 1.68 0.5 - 1.9 mmol/L  I-Stat Troponin, ED (not at [facility])     Status: None   Collection Time: 01/29/18  1:51 PM  Result Value Ref Range   Troponin i, poc 0.00 0.00 - 0.08 ng/mL   Comment 3            Comment: Due to the release kinetics of cTnI, a negative result within the first hours of the onset of symptoms does not rule out myocardial infarction with certainty. If myocardial infarction is still suspected, repeat the test at appropriate intervals.   Urinalysis, Routine w reflex microscopic     Status: Abnormal   Collection Time: 01/29/18  3:57 PM  Result Value Ref Range   Color, Urine YELLOW YELLOW   APPearance CLEAR CLEAR   Specific Gravity, Urine 1.010 1.005 - 1.030   pH 7.0 5.0 - 8.0   Glucose, UA NEGATIVE NEGATIVE mg/dL   Hgb urine dipstick NEGATIVE NEGATIVE   Bilirubin Urine NEGATIVE NEGATIVE   Ketones, ur 5 (A) NEGATIVE mg/dL   Protein, ur NEGATIVE NEGATIVE mg/dL   Nitrite NEGATIVE NEGATIVE   Leukocytes, UA NEGATIVE NEGATIVE    Comment: Performed at [facility] [address] [place], [place] [address]  Basic metabolic panel     Status: Abnormal   Collection Time: 01/29/18  4:30 PM  Result Value Ref Range   Sodium 116 (LL) 135 - 145 mmol/L    Comment: CRITICAL RESULT CALLED TO, READ BACK BY AND VERIFIED WITH: S.WEST AT 1723 ON 01/29/18 BY N.Dayna Geurts    Potassium 3.7 3.5 - 5.1 mmol/L   Chloride 85 (L) 101 - 111 mmol/L   CO2 24 22 - 32 mmol/L   Glucose, Bld 125 (H) 65 - 99 mg/dL   BUN 24 (H) 6 - 20 mg/dL   Creatinine, Ser 0.71 0.44 - 1.00 mg/dL   Calcium 8.2 (L) 8.9 - 10.3 mg/dL   GFR calc non Af Amer >60 >60 mL/min   GFR calc Af Amer >60 >60 mL/min    Comment: (NOTE) The eGFR has been calculated  using the CKD EPI equation. This calculation has not been validated in all clinical situations. eGFR's persistently <60 mL/min signify possible Chronic Kidney Disease.    Anion gap 7 5 - 15    Comment: Performed at [facility], [place] [address], [place],  [address]   Dg Chest 2 View  Result Date: 01/29/2018 CLINICAL DATA:  UTI.  Altered mental status. EXAM: CHEST - 2 VIEW COMPARISON:  03/31/2017. FINDINGS: The heart is enlarged. Dual lead pacer unchanged. No consolidation or edema. No effusion or pneumothorax. Osteopenia with evidence of previous vertebral augmentation. Chronic appearing T10 and T8 vertebral body fractures. IMPRESSION: Cardiomegaly.  No active disease. Electronically Signed   By: Staci Righter M.D.   On: 01/29/2018 14:38    Pending Labs Unresulted Labs (From admission, onward)   Start     Ordered   01/30/18 9774  Basic metabolic panel  Daily,   R     01/29/18 1744   01/29/18 1730  Osmolality, urine  Once,   R     01/29/18 1729   01/29/18 1730  Osmolality  Once,  R     01/29/18 1729   01/29/18 1730  Sodium, urine, random  Once,   R     01/29/18 1729   01/29/18 1728  Urinalysis, Routine w reflex microscopic  Once,   R     01/29/18 1728   01/29/18 1728  Culture, Urine  Once,   R     01/29/18 1728   01/29/18 1321  Culture, blood (Routine x 2)  BLOOD CULTURE X 2,   STAT     01/29/18 1320      Vitals/Pain Today's Vitals   01/29/18 1724 01/29/18 1730 01/29/18 1759 01/29/18 1800  BP:  (!) 118/95 (!) 118/95 (!) 138/97  Pulse: 89 88 94 (!) 103  Resp: _0 Temp:      TempSrc:      SpO2: 99% 100% 100% 98%  Weight:      Height:      PainSc:        Isolation Precautions No active isolations  Medications Medications  enoxaparin (LOVENOX) injection 30 mg (has no administration in time range)  diltiazem (CARDIZEM CD) 24 hr capsule 240 mg (has no administration in time range)  apixaban (ELIQUIS) tablet 2.5 mg (has no  administration in time range)  metoprolol tartrate (LOPRESSOR) tablet 25 mg (has no administration in time range)  pantoprazole (PROTONIX) EC tablet 40 mg (has no administration in time range)  rosuvastatin (CRESTOR) tablet 10 mg (has no administration in time range)  0.9 %  sodium chloride infusion (has no administration in time range)  sodium chloride 0.9 % bolus 500 mL (0 mLs Intravenous Stopped 01/29/18 1456)  0.9 %  sodium chloride infusion ( Intravenous New Bag/Given 01/29/18 1459)    Mobility walks with device

## 2018-01-29 NOTE — H&P (Addendum)
History and Physical    ALLYN BARTELSON QMV:[id number] DOB: [birth year] DOA: 01/29/2018  PCP: Lanice Shirts, MD Patient coming from: Home  Chief Complaint: Altered mental status,  HPI: Donna Ortega is a 84 y.o. female with medical history significant of chronic hyponatremia, CAD, three-vessel CABG, GI bleed, diverticulitis, history of A. fib, hypertension admitted with altered mental status.  Patient was brought in by her daughter-in-law who lives with the patient.  According to daughter-in-law patient had altered mental status few days ago to go to urgent care diagnosed with UTI was given antibiotics with results.  She has taken 3 doses of her antibiotic.  Patient has been more and more lethargic over the last few days.  She has to climb 15 steps to get out of the house she was very scared to take her upstairs because of falls.  She denied any chest pain shortness of breath abdominal pain nausea vomiting or diarrhea.  No fever no abdominal pain.  No headaches.  Have a generalized weakness. Was found to be 113.  Family reports that she has never been that low in the past. Family noticed patient with decreased p.o. intake.  His appetite.   ED Course: IV fluids at 135 cc an hour.  Her sodium was found to be 113.   Sodium 09/13/1962 pulse 89 respiration 18 sats 100% on room air. Review of Systems: Significant for generalized weakness increasing lethargy.  No fever chills nausea vomiting. Ambulatory Status: At baseline she ambulates in the house.  Past Medical History:  Diagnosis Date  . Arthritis    osteo   in back  . Coronary artery disease 2007   3 vessel CABG with LIMA-LAD, SVG to diag, and SVG - Ramus  . Diverticulitis   . GI bleeding 09/09/2013  . Hearing loss of left ear 07/2013  . History of atrial fibrillation; review of all ECG are most consistent with atrial tachycardia    had rash with amiodarone; no anticoagulation due to GI bleed in December 2012  . History of GI bleed Dec  2012  . History of hyperkalemia   . Hyperlipidemia   . Hypertension   . Hyponatremia   . Pacemaker-Medtronic 02/09/2012  . Palpitations     Past Surgical History:  Procedure Laterality Date  . ABDOMINAL HYSTERECTOMY    . COLONOSCOPY N/A 09/12/2013   Procedure: COLONOSCOPY;  Surgeon: Wonda Horner, MD;  Location: [facility] ENDOSCOPY;  Service: Endoscopy;  Laterality: N/A;  . CORONARY ARTERY BYPASS GRAFT  Feb 2007   Dr. Cyndia Bent;   . ESOPHAGOGASTRODUODENOSCOPY  08/04/2011   Procedure: ESOPHAGOGASTRODUODENOSCOPY (EGD);  Surgeon: Cleotis Nipper, MD;  Location: [facility] ENDOSCOPY;  Service: Endoscopy;  Laterality: N/A;  do at bedside  . ESOPHAGOGASTRODUODENOSCOPY N/A 09/11/2013   Procedure: ESOPHAGOGASTRODUODENOSCOPY (EGD);  Surgeon: Jeryl Columbia, MD;  Location: [facility] ENDOSCOPY;  Service: Endoscopy;  Laterality: N/A;  . IR KYPHO THORACIC WITH BONE BIOPSY  04/11/2017  . IR RADIOLOGIST EVAL & MGMT  04/04/2017  . MAZE    . pace maker    . PARTIAL HYSTERECTOMY    . PERMANENT PACEMAKER INSERTION N/A 02/08/2012   Procedure: PERMANENT PACEMAKER INSERTION;  Surgeon: Deboraha Sprang, MD;  Location: [facility] CATH LAB;  Service: Cardiovascular;  Laterality: N/A;    Social History   Socioeconomic History  . Marital status: Widowed    Spouse name: Not on file  . Number of children: Not on file  . Years of education: Not on file  . Highest  education level: Not on file  Occupational History  . Not on file  Social Needs  . Financial resource strain: Not on file  . Food insecurity:    Worry: Not on file    Inability: Not on file  . Transportation needs:    Medical: Not on file    Non-medical: Not on file  Tobacco Use  . Smoking status: Never Smoker  . Smokeless tobacco: Never Used  Substance and Sexual Activity  . Alcohol use: No  . Drug use: No  . Sexual activity: Never  Lifestyle  . Physical activity:    Days per week: Not on file    Minutes per session: Not on file  . Stress: Not on file  Relationships    . Social connections:    Talks on phone: Not on file    Gets together: Not on file    Attends religious service: Not on file    Active member of club or organization: Not on file    Attends meetings of clubs or organizations: Not on file    Relationship status: Not on file  . Intimate partner violence:    Fear of current or ex partner: Not on file    Emotionally abused: Not on file    Physically abused: Not on file    Forced sexual activity: Not on file  Other Topics Concern  . Not on file  Social History Narrative   Ambulatory usually independently, plays golf   Lives at home with family   Daughter in law Ranae Casebier [phone]    Allergies  Allergen Reactions  . Amiodarone Hives    ALL OVER BODY HIVES/ ITCH   . Aspirin Other (See Comments)    Gi bleed   . Lipitor [Atorvastatin Calcium] Other (See Comments)    MUSCLE ACHES  . Nsaids Other (See Comments)    GI bleed  . Tolmetin Other (See Comments)    GI bleed  . Naproxen Other (See Comments)  . Niacin Rash  . Niaspan [Niacin Er] Rash    Family History  Problem Relation Age of Onset  . Heart failure Father   . Kidney failure Father   . Bone cancer Brother   . Kidney failure Sister   . Colon cancer Sister   . Cancer Sister       Prior to Admission medications   Medication Sig Start Date End Date Taking? Authorizing Provider  Calcium Carbonate-Vitamin D (CALTRATE 600+D PO) Take 1 tablet by mouth daily.    Yes [provider]  cefdinir (OMNICEF) 300 MG capsule TAKE ONE CAPSULE BY MOUTH BID FOR 5 DAYS 01/28/18  Yes [provider]  diltiazem (CARDIZEM CD) 240 MG 24 hr capsule TAKE 1 CAPSULE (240 MG TOTAL) BY MOUTH DAILY. 06/29/17  Yes Nahser, Wonda Cheng, MD  diphenhydramine-acetaminophen (TYLENOL PM) 25-500 MG TABS tablet Take 2 tablets by mouth at bedtime.   Yes [provider]  ELIQUIS 2.5 MG TABS tablet TAKE 1 TABLET BY MOUTH TWICE A DAY 09/14/17  Yes Nahser, Wonda Cheng, MD  metoprolol  tartrate (LOPRESSOR) 25 MG tablet Take 1 tablet (25 mg total) by mouth 2 (two) times daily. 08/02/17  Yes Nahser, Wonda Cheng, MD  pantoprazole (PROTONIX) 40 MG tablet TAKE 1 TABLET (40 MG TOTAL) BY MOUTH DAILY. 09/17/14  Yes Schoenhoff, Altamese Cabal, MD  rosuvastatin (CRESTOR) 10 MG tablet Take 1 tablet (10 mg total) by mouth daily. 07/10/17  Yes Nahser, Wonda Cheng, MD  Physical Exam: Elderly female in no acute distress answers all the questions appropriately. Vitals:   01/29/18 1456 01/29/18 1600 01/29/18 1604 01/29/18 1630  BP:  124/80 124/80 130/66  Pulse:  92 90 89  Resp:   15 18  Temp: (!) 97 F (36.1 C)     TempSrc: Rectal     SpO2:  100% 100% 100%  Weight:      Height:         General:  Appears calm and comfortable.AWAKE ALERT ORIENTED Eyes:  PERRL, EOMI, normal lids, iris ENT:  grossly normal hearing, lips & tongue, mmm Neck:  no LAD, masses or thyromegaly Cardiovascular:  RRR, no m/r/g. No LE edema.  Respiratory:  CTA bilaterally, no w/r/r. Normal respiratory effort. Abdomen: soft, ntnd, NABS Skin:  no rash or induration seen on limited exam Musculoskeletal: grossly normal tone BUE/BLE, good ROM, no bony abnormality Psychiatric:  grossly normal mood and affect, speech fluent and appropriate, AOx3 Neurologic: CN 2-12 grossly intact, moves all extremities in coordinated fashion, sensation intact  Labs on Admission: I have personally reviewed following labs and imaging studies  CBC: Recent Labs  Lab 01/29/18 1343  WBC 8.9  NEUTROABS 6.9  HGB 14.1  HCT 38.1  MCV 85.8  PLT 329   Basic Metabolic Panel: Recent Labs  Lab 01/29/18 1343  NA 113*  K 4.0  CL 78*  CO2 22  GLUCOSE 128*  BUN 29*  CREATININE 1.01*  CALCIUM 8.9   GFR: Estimated Creatinine Clearance: 31.2 mL/min (A) (by C-G formula based on SCr of 1.01 mg/dL (H)). Liver Function Tests: Recent Labs  Lab 01/29/18 1343  AST 31  ALT 19  ALKPHOS 54  BILITOT 0.9  PROT 7.2  ALBUMIN 4.3   No results for  input(s): LIPASE, AMYLASE in the last 168 hours. No results for input(s): AMMONIA in the last 168 hours. Coagulation Profile: Recent Labs  Lab 01/29/18 1343  INR 0.96   Cardiac Enzymes: No results for input(s): CKTOTAL, CKMB, CKMBINDEX, TROPONINI in the last 168 hours. BNP (last 3 results) No results for input(s): PROBNP in the last 8760 hours. HbA1C: No results for input(s): HGBA1C in the last 72 hours. CBG: Recent Labs  Lab 01/29/18 1323  GLUCAP 147*   Lipid Profile: No results for input(s): CHOL, HDL, LDLCALC, TRIG, CHOLHDL, LDLDIRECT in the last 72 hours. Thyroid Function Tests: No results for input(s): TSH, T4TOTAL, FREET4, T3FREE, THYROIDAB in the last 72 hours. Anemia Panel: No results for input(s): VITAMINB12, FOLATE, FERRITIN, TIBC, IRON, RETICCTPCT in the last 72 hours. Urine analysis:    Component Value Date/Time   COLORURINE YELLOW 01/29/2018 [place] 01/29/2018 1557   LABSPEC 1.010 01/29/2018 1557   PHURINE 7.0 01/29/2018 1557   GLUCOSEU NEGATIVE 01/29/2018 1557   HGBUR NEGATIVE 01/29/2018 1557   BILIRUBINUR NEGATIVE 01/29/2018 1557   BILIRUBINUR neg 09/24/2014 0907   KETONESUR 5 (A) 01/29/2018 1557   PROTEINUR NEGATIVE 01/29/2018 1557   UROBILINOGEN negative 09/24/2014 0907   UROBILINOGEN 0.2 08/20/2011 2000   NITRITE NEGATIVE 01/29/2018 1557   LEUKOCYTESUR NEGATIVE 01/29/2018 1557    Creatinine Clearance: Estimated Creatinine Clearance: 31.2 mL/min (A) (by C-G formula based on SCr of 1.01 mg/dL (H)).  Sepsis Labs: @LABRCNTIP (procalcitonin:4,lacticidven:4) )No results found for this or any previous visit (from the past 240 hour(s)).   Radiological Exams on Admission: Dg Chest 2 View  Result Date: 01/29/2018 CLINICAL DATA:  UTI.  Altered mental status. EXAM: CHEST - 2 VIEW COMPARISON:  03/31/2017.  FINDINGS: The heart is enlarged. Dual lead pacer unchanged. No consolidation or edema. No effusion or pneumothorax. Osteopenia with  evidence of previous vertebral augmentation. Chronic appearing T10 and T8 vertebral body fractures. IMPRESSION: Cardiomegaly.  No active disease. Electronically Signed   By: Staci Righter M.D.   On: 01/29/2018 14:38     Assessment/Plan Active Problems:   * No active hospital problems. *   1] hyponatremia patient with history of chronic hyponatremia-patient presented with complaints of generalized weakness and lethargy.  She is awake and alert and oriented in the hospital.  She has been started on normal saline.  Stat BMP pending at this time.  Discussed with Dr. Hollie Salk with nephrology.  Urine and serum osmolality and urine sodium.  Continue normal saline for now.  If the stat BMP comes back with a low sodium nephrology will start the patient on 3% normal saline.  2]RECURRENT UTI got 3 dose of antibiotics as an outpatient recheck UA and culture.  3] generalized weakness-patient's family is finding hard to take care of the patient at home 24 hours a day.  They are requesting nursing home placement PT OT evaluation social worker consult.  There are times when patient is alone at home.  4] chronic A. fib restart Eliquis.  5] hypertension restart Lopressor.  6] hyperlipidemia continue Crestor.   DVT prophylaxis: Lovenox Code Status: DO NOT RESUSCITATE Family Communication: Discussed with daughter-in-law Disposition Plan: Plan discharge to skilled nursing versus nursing home Consults called: None Admission status: Inpatient   Georgette Shell MD [facility]  If 7PM-7AM, please contact night-coverage [email or web address] Password [email or web address]  01/29/2018, 5:15 PM

## 2018-01-29 NOTE — ED Triage Notes (Signed)
Family brought in pt for AMS over 4 days. Was seen at [facility] yesterday and diagnosed with UTI and started on antibiotics had 3 doses. family reports today that today still altered and stumbling with gait and not her normal.  Family adds that she has had issues with low sodium in past.

## 2018-01-30 DIAGNOSIS — I4891 Unspecified atrial fibrillation: Secondary | ICD-10-CM

## 2018-01-30 DIAGNOSIS — N179 Acute kidney failure, unspecified: Secondary | ICD-10-CM

## 2018-01-30 DIAGNOSIS — G9341 Metabolic encephalopathy: Secondary | ICD-10-CM

## 2018-01-30 DIAGNOSIS — I480 Paroxysmal atrial fibrillation: Secondary | ICD-10-CM

## 2018-01-30 DIAGNOSIS — E871 Hypo-osmolality and hyponatremia: Secondary | ICD-10-CM

## 2018-01-30 DIAGNOSIS — R627 Adult failure to thrive: Secondary | ICD-10-CM

## 2018-01-30 LAB — BASIC METABOLIC PANEL
Anion gap: 11 (ref 5–15)
BUN: 21 mg/dL — ABNORMAL HIGH (ref 6–20)
CHLORIDE: 83 mmol/L — AB (ref 101–111)
CO2: 23 mmol/L (ref 22–32)
Calcium: 8.4 mg/dL — ABNORMAL LOW (ref 8.9–10.3)
Creatinine, Ser: <0.3 mg/dL — ABNORMAL LOW (ref 0.44–1.00)
Glucose, Bld: 102 mg/dL — ABNORMAL HIGH (ref 65–99)
POTASSIUM: 3.8 mmol/L (ref 3.5–5.1)
Sodium: 117 mmol/L — CL (ref 135–145)

## 2018-01-30 MED ORDER — METOPROLOL TARTRATE 25 MG PO TABS
37.5000 mg | ORAL_TABLET | Freq: Two times a day (BID) | ORAL | Status: DC
  Administered 2018-01-30: 37.5 mg via ORAL
  Filled 2018-01-30: qty 1

## 2018-01-30 MED ORDER — SODIUM CHLORIDE 0.9 % IV SOLN
INTRAVENOUS | Status: AC
  Administered 2018-01-30: 08:00:00 via INTRAVENOUS

## 2018-01-30 NOTE — Progress Notes (Signed)
PROGRESS NOTE  Donna Ortega NGE:[id number] DOB: 10/22/1925 DOA: 01/29/2018 PCP: Lanice Shirts, MD  HPI/Recap of past 24 hours:  Sitting up in chair, she answers questions slowly but aaox3, no edema,  Son at bedside  Assessment/Plan: Active Problems:   Atrial fibrillation, chronic (HCC)   Hyponatremia   Metabolic encephalopathy/ Acute on chron hyponatremia -appear dry, with reported poor oral intake -sodium on presentation is 113, she improved to 117 this am, she was only on ivf briefly last night -will resume ivf, repeat labs in am, per admitting MD nephrology will not formally consult at this time   paf with sick sinus syndrome s/p pace maker -On lopressor /apixaban -Currently in afib -Cardiology consulted, may consider cardioversion if sodium improves, meds adjustment per cardiology   AKI on CKDII -likely from dehydration -she was Recently treated for uti with omnicef, -ua on presentation clean -bun/cr 29/1 on presentation, bun 21/cr less than 0.3 this am with hydration -continue hydration  FTT: will get PT eval, likely will need SNF placement  Code Status: DNR  Family Communication: patient and son at bedside  Disposition Plan: family hope that patient can be discharged to snf, PT eval pending   Consultants:  cardiology  Procedures:  none  Antibiotics:  none   Objective: BP (!) 160/79   Pulse 91   Temp 98.1 F (36.7 C) (Oral)   Resp (!) 23   Ht 5\' 3"  (1.6 m)   Wt 59.6 kg (131 lb 6.3 oz)   SpO2 100%   BMI 23.28 kg/m   Intake/Output Summary (Last 24 hours) at 01/30/2018 0801 Last data filed at 01/30/2018 0200 Gross per 24 hour  Intake -  Output 525 ml  Net -525 ml   Filed Weights   01/29/18 1318 01/29/18 1900  Weight: 59 kg (130 lb) 59.6 kg (131 lb 6.3 oz)    Exam: Patient is examined daily including today on 01/30/2018, exams remain the same as of yesterday except that has changed    General:  Frail, NAD, slow in answering  questions but aaox3  Cardiovascular: IRRR  Respiratory: CTABL  Abdomen: Soft/ND/NT, positive BS  Musculoskeletal: No Edema  Neuro: alert, oriented   Data Reviewed: Basic Metabolic Panel: Recent Labs  Lab 01/29/18 1343 01/29/18 1630 01/30/18 0302  NA 113* 116* 117*  K 4.0 3.7 3.8  CL 78* 85* 83*  CO2 22 24 23   GLUCOSE 128* 125* 102*  BUN 29* 24* 21*  CREATININE 1.01* 0.71 <0.30*  CALCIUM 8.9 8.2* 8.4*   Liver Function Tests: Recent Labs  Lab 01/29/18 1343  AST 31  ALT 19  ALKPHOS 54  BILITOT 0.9  PROT 7.2  ALBUMIN 4.3   No results for input(s): LIPASE, AMYLASE in the last 168 hours. No results for input(s): AMMONIA in the last 168 hours. CBC: Recent Labs  Lab 01/29/18 1343  WBC 8.9  NEUTROABS 6.9  HGB 14.1  HCT 38.1  MCV 85.8  PLT 248   Cardiac Enzymes:   No results for input(s): CKTOTAL, CKMB, CKMBINDEX, TROPONINI in the last 168 hours. BNP (last 3 results) No results for input(s): BNP in the last 8760 hours.  ProBNP (last 3 results) No results for input(s): PROBNP in the last 8760 hours.  CBG: Recent Labs  Lab 01/29/18 1323  GLUCAP 147*    Recent Results (from the past 240 hour(s))  MRSA PCR Screening     Status: None   Collection Time: 01/29/18  6:56 PM  Result Value  Ref Range Status   MRSA by PCR NEGATIVE NEGATIVE Final    Comment:        The GeneXpert MRSA Assay (FDA approved for NASAL specimens only), is one component of a comprehensive MRSA colonization surveillance program. It is not intended to diagnose MRSA infection nor to guide or monitor treatment for MRSA infections. Performed at [facility], [place] [address] [place], [place] [address]      Studies: Dg Chest 2 View  Result Date: 01/29/2018 CLINICAL DATA:  UTI.  Altered mental status. EXAM: CHEST - 2 VIEW COMPARISON:  03/31/2017. FINDINGS: The heart is enlarged. Dual lead pacer unchanged. No consolidation or edema. No effusion or pneumothorax.  Osteopenia with evidence of previous vertebral augmentation. Chronic appearing T10 and T8 vertebral body fractures. IMPRESSION: Cardiomegaly.  No active disease. Electronically Signed   By: Staci Righter M.D.   On: 01/29/2018 14:38    Scheduled Meds: . apixaban  2.5 mg Oral BID  . diltiazem  240 mg Oral Daily  . metoprolol tartrate  25 mg Oral BID  . pantoprazole  40 mg Oral Daily  . rosuvastatin  10 mg Oral Daily    Continuous Infusions: . sodium chloride       Time spent: 35 mins I have personally reviewed and interpreted on  01/30/2018 daily labs, tele strips, imagings as discussed above under date review session and assessment and plans.  I reviewed all nursing notes, pharmacy notes, consultant notes,  vitals, pertinent old records  I have discussed plan of care as described above with RN , patient and family on 01/30/2018   Florencia Reasons MD, PhD  [facility] Pager [phone]. If 7PM-7AM, please contact night-coverage at [email or web address], password [email or web address] 01/30/2018, 8:01 AM  LOS: 1 day

## 2018-01-30 NOTE — Evaluation (Addendum)
Physical Therapy Evaluation Patient Details Name: Donna Ortega MRN: [id number] DOB: 1925-10-20 Today's Date: 01/30/2018   History of Present Illness  Donna Ortega is a 84 y.o. female with medical history significant of chronic hyponatremia, CAD, three-vessel CABG, GI bleed, diverticulitis, history of A. fib, hypertension admitted with altered mental status on 01/29/18, found to have sodium 113, recent  urgent care visit forUTI.  Clinical Impression  No family present to confirm prior level of function( driving, etc). Patient's  Sodium 117 this AM. Patient mobilized to [facility]. BP standing 85/45. Pt admitted with above diagnosis. Pt currently with functional limitations due to the deficits listed below (see PT Problem List).  Pt will benefit from skilled PT to increase their independence and safety with mobility to allow discharge to the venue listed below.    Patient may be able to return home with supervision once sodium normalizes.      Follow Up Recommendations Home health PT;SNF(need family input)    Equipment Recommendations  (tba)    Recommendations for Other Services       Precautions / Restrictions Precautions Precautions: Fall Precaution Comments: Na remmains low 117, monito low BP Restrictions Weight Bearing Restrictions: No      Mobility  Bed Mobility Overal bed mobility: Needs Assistance Bed Mobility: Supine to Sit;Sit to Supine     Supine to sit: Min guard Sit to supine: Min assist   General bed mobility comments: assist wwith legs onto bed  Transfers Overall transfer level: Needs assistance Equipment used: Rolling walker (2 wheeled) Transfers: Sit to/from [facility] Sit to Stand: Min assist Stand pivot transfers: Min assist       General transfer comment: steady assist to rise. Pivot to [facility] and back to bed. BP 85/45 standing  Ambulation/Gait             General Gait Details: NT due to low BP  Stairs            Wheelchair  Mobility    Modified Rankin (Stroke Patients Only)       Balance                                             Pertinent Vitals/Pain Pain Assessment: No/denies pain    Home Living Family/patient expects to be discharged to:: Private residence Living Arrangements: Children;Other relatives Available Help at Discharge: Family;Available PRN/intermittently Type of Home: House Home Access: Stairs to enter   [employer] of Steps: 2 Home Layout: Two level Home Equipment: Cane - quad      Prior Function           Comments: patient reports driving, plays bridge recently, no family present to confirm     Hand Dominance        Extremity/Trunk Assessment        Lower Extremity Assessment Lower Extremity Assessment: Generalized weakness    Cervical / Trunk Assessment Cervical / Trunk Assessment: Normal  Communication   Communication: No difficulties  Cognition Arousal/Alertness: Awake/alert Behavior During Therapy: WFL for tasks assessed/performed Overall Cognitive Status: No family/caregiver present to determine baseline cognitive functioning Area of Impairment: Orientation                 Orientation Level: Time             General Comments: referred to calendar  General Comments      Exercises     Assessment/Plan    PT Assessment Patient needs continued PT services  PT Problem List Decreased strength;Decreased activity tolerance;Decreased mobility;Decreased cognition       PT Treatment Interventions DME instruction;Gait training;Stair training;Functional mobility training;Therapeutic activities;Patient/family education    PT Goals (Current goals can be found in the Care Plan section)  Acute Rehab PT Goals Patient Stated Goal: to drive PT Goal Formulation: With patient Time For Goal Achievement: 02/13/18 Potential to Achieve Goals: Good    Frequency Min 3X/week   Barriers to discharge   per RN,  daughter in law is hesitant  for return home, son is wanting  home will see how pt. progresses    Co-evaluation               AM-PAC PT "6 Clicks" Daily Activity  Outcome Measure Difficulty turning over in bed (including adjusting bedclothes, sheets and blankets)?: A Little Difficulty moving from lying on back to sitting on the side of the bed? : A Little Difficulty sitting down on and standing up from a chair with arms (e.g., wheelchair, bedside commode, etc,.)?: A Lot Help needed moving to and from a bed to chair (including a wheelchair)?: A Lot Help needed walking in hospital room?: Total Help needed climbing 3-5 steps with a railing? : Total 6 Click Score: 12    End of Session   Activity Tolerance: Patient limited by fatigue Patient left: in bed;with call bell/phone within reach;with bed alarm set Nurse Communication: Mobility status PT Visit Diagnosis: Unsteadiness on feet (R26.81)    Time: 1224-8250 PT Time Calculation (min) (ACUTE ONLY): 24 min   Charges:   PT Evaluation $PT Eval Low Complexity: 1 Low PT Treatments $Therapeutic Activity: 8-22 mins   PT G CodesTresa Ortega PT [phone]   Donna Ortega 01/30/2018, 4:48 PM

## 2018-01-30 NOTE — Consult Note (Addendum)
Cardiology Consultation:   Patient ID: Donna Ortega; [id number]; 08/17/1925   Admit date: 01/29/2018 Date of Consult: 01/30/2018  Primary Care Provider: Lanice Shirts, MD Primary Cardiologist: Dr. Mertie Moores, MD  Patient Profile:   Donna Ortega is a 84 y.o. female with a hx of CAD s/p CABG (2007), paroxysmal atrial fibrillation/atrial tachycardia, history of tachybradycardia syndrome s/p PPM, GI bleed, diverticulitis, HTN, HLD and chronic hyponatremia who is being seen today for the evaluation of atrial fibrillation at the request of Dr. Erlinda Hong.   Known history of amiodarone allergy. On Eliquis despite history of GI bleed 2012 per primary cardiologist.  History of Present Illness:   Ms. Southgate is an 84 year old female with a history stated above who presented to [facility] on 01/29/2018 with altered mental status.  Patient was brought in by her daughter-in-law who lives with the patient. History obtained from chart review as the patient does not remember events prior to admission. According to the chart review, patient had altered mental status for several days. She was recently taken to urgent care and diagnosed with a UTI and was given antibiotics. At the time of admission, patient had taken approximately 3 doses of her antibiotic and was noted to be more lethargic with decreased appetite and PO intake during that time. She denied any chest pain, shortness of breath, orthopnea, palpitations, N/V, dizziness or syncope.  In the ED, her sodium was found to be 113 (history of chronic hyponatremia with baseline of 130 however with remote hx in the  120s). 113 is unusually low for her. Unfortunately, patient was found to be back in atrial fibrillation with rate control (HR 100's). Cardiology has been consulted given her history of paroxysmal atrial fibrillation.  Of note, she was last seen in the office on 01/22/2018 for symptomatic atrial fibrillation. At that time, patient reported a 2-day  history of intermittent shortness of breath and fatigue with no evidence of palpitations. On the morning of the office visit, granddaughter noted HR of 113 and a BP of 140/100.  The patient took an extra 25 mg of metoprolol as advised by Dr. Acie Fredrickson with symptom resolution and conversion to NSR.  EKG at the time was performed which confirmed sinus rhythm. She denied orthopnea, PND, chest pain or syncope. She had recently been treated for UTI. Recommendations were made to continue BB and CCB at the current dose with extra metoprolol as needed if symptomatic Afib recurred.  Past Medical History:  Diagnosis Date  . Arthritis    osteo   in back  . Coronary artery disease 2007   3 vessel CABG with LIMA-LAD, SVG to diag, and SVG - Ramus  . Diverticulitis   . GI bleeding 09/09/2013  . Hearing loss of left ear 07/2013  . History of atrial fibrillation; review of all ECG are most consistent with atrial tachycardia    had rash with amiodarone; no anticoagulation due to GI bleed in December 2012  . History of GI bleed Dec 2012  . History of hyperkalemia   . Hyperlipidemia   . Hypertension   . Hyponatremia   . Pacemaker-[employer]tr[employer] 02/09/2012  . Palpitations     Past Surgical History:  Procedure Laterality Date  . ABDOMINAL HYSTERECTOMY    . COLONOSCOPY N/A 09/12/2013   Procedure: COLONOSCOPY;  Surgeon: Wonda Horner, MD;  Location: [facility] ENDOSCOPY;  Service: Endoscopy;  Laterality: N/A;  . CORONARY ARTERY BYPASS GRAFT  Feb 2007   Dr. Cyndia Bent;   . ESOPHAGOGASTRODUODENOSCOPY  08/04/2011   Procedure: ESOPHAGOGASTRODUODENOSCOPY (EGD);  Surgeon: Cleotis Nipper, MD;  Location: [facility] ENDOSCOPY;  Service: Endoscopy;  Laterality: N/A;  do at bedside  . ESOPHAGOGASTRODUODENOSCOPY N/A 09/11/2013   Procedure: ESOPHAGOGASTRODUODENOSCOPY (EGD);  Surgeon: Jeryl Columbia, MD;  Location: [facility] ENDOSCOPY;  Service: Endoscopy;  Laterality: N/A;  . IR KYPHO THORACIC WITH BONE BIOPSY  04/11/2017  . IR RADIOLOGIST EVAL & MGMT   04/04/2017  . MAZE    . pace maker    . PARTIAL HYSTERECTOMY    . PERMANENT PACEMAKER INSERTION N/A 02/08/2012   Procedure: PERMANENT PACEMAKER INSERTION;  Surgeon: Deboraha Sprang, MD;  Location: [facility] CATH LAB;  Service: Cardiovascular;  Laterality: N/A;     Prior to Admission medications   Medication Sig Start Date End Date Taking? Authorizing Provider  Calcium Carbonate-Vitamin D (CALTRATE 600+D PO) Take 1 tablet by mouth daily.    Yes [provider]  cefdinir (OMNICEF) 300 MG capsule TAKE ONE CAPSULE BY MOUTH BID FOR 5 DAYS 01/28/18  Yes [provider]  diltiazem (CARDIZEM CD) 240 MG 24 hr capsule TAKE 1 CAPSULE (240 MG TOTAL) BY MOUTH DAILY. 06/29/17  Yes Nahser, Wonda Cheng, MD  diphenhydramine-acetaminophen (TYLENOL PM) 25-500 MG TABS tablet Take 2 tablets by mouth at bedtime.   Yes [provider]  ELIQUIS 2.5 MG TABS tablet TAKE 1 TABLET BY MOUTH TWICE A DAY 09/14/17  Yes Nahser, Wonda Cheng, MD  metoprolol tartrate (LOPRESSOR) 25 MG tablet Take 1 tablet (25 mg total) by mouth 2 (two) times daily. 08/02/17  Yes Nahser, Wonda Cheng, MD  pantoprazole (PROTONIX) 40 MG tablet TAKE 1 TABLET (40 MG TOTAL) BY MOUTH DAILY. 09/17/14  Yes Schoenhoff, Altamese Cabal, MD  rosuvastatin (CRESTOR) 10 MG tablet Take 1 tablet (10 mg total) by mouth daily. 07/10/17  Yes Nahser, Wonda Cheng, MD    Inpatient Medications: Scheduled Meds: . apixaban  2.5 mg Oral BID  . diltiazem  240 mg Oral Daily  . metoprolol tartrate  25 mg Oral BID  . pantoprazole  40 mg Oral Daily  . rosuvastatin  10 mg Oral Daily   Continuous Infusions: . sodium chloride 75 mL/hr at 01/30/18 0816   PRN Meds: acetaminophen, diphenhydrAMINE  Allergies:    Allergies  Allergen Reactions  . Amiodarone Hives    ALL OVER BODY HIVES/ ITCH   . Aspirin Other (See Comments)    Gi bleed   . Lipitor [Atorvastatin Calcium] Other (See Comments)    MUSCLE ACHES  . Nsaids Other (See Comments)    GI bleed  . Tolmetin Other  (See Comments)    GI bleed  . Naproxen Other (See Comments)  . Niacin Rash  . Niaspan [Niacin Er] Rash   Social History:   Social History   Socioeconomic History  . Marital status: Widowed    Spouse name: Not on file  . Number of children: Not on file  . Years of education: Not on file  . Highest education level: Not on file  Occupational History  . Not on file  Social Needs  . Financial resource strain: Not on file  . Food insecurity:    Worry: Not on file    Inability: Not on file  . Transportation needs:    Medical: Not on file    Non-medical: Not on file  Tobacco Use  . Smoking status: Never Smoker  . Smokeless tobacco: Never Used  Substance and Sexual Activity  . Alcohol use: No  . Drug use:  No  . Sexual activity: Never  Lifestyle  . Physical activity:    Days per week: Not on file    Minutes per session: Not on file  . Stress: Not on file  Relationships  . Social connections:    Talks on phone: Not on file    Gets together: Not on file    Attends religious service: Not on file    Active member of club or organization: Not on file    Attends meetings of clubs or organizations: Not on file    Relationship status: Not on file  . Intimate partner violence:    Fear of current or ex partner: Not on file    Emotionally abused: Not on file    Physically abused: Not on file    Forced sexual activity: Not on file  Other Topics Concern  . Not on file  Social History Narrative   Ambulatory usually independently, plays golf   Lives at home with family   Daughter in law Alyna Stensland [phone]    Family History:   Family History  Problem Relation Age of Onset  . Heart failure Father   . Kidney failure Father   . Bone cancer Brother   . Kidney failure Sister   . Colon cancer Sister   . Cancer Sister    Family Status:  Family Status  Relation Name Status  . Mother  Deceased at age 85  . Father  Deceased at age 14  . Sister  Alive  . Brother  Deceased at  age 90  . Sister  Alive  . Sister  Alive  . Sister  Deceased  . Sister  Deceased  . Sister  Deceased  . Sister  Deceased  . MGM  Deceased  . MGF  Deceased  . PGM  Deceased  . PGF  Deceased   ROS:  Please see the history of present illness.  All other ROS reviewed and negative.     Physical Exam/Data:   Vitals:   01/29/18 2300 01/30/18 0000 01/30/18 0400 01/30/18 0738  BP: (!) 160/79     Pulse: 91     Resp: (!) 23     Temp:  98.5 F (36.9 C) 98.7 F (37.1 C) 98.1 F (36.7 C)  TempSrc:  Oral Oral Oral  SpO2: 100%     Weight:      Height:        Intake/Output Summary (Last 24 hours) at 01/30/2018 0920 Last data filed at 01/30/2018 0200 Gross per 24 hour  Intake -  Output 525 ml  Net -525 ml   Filed Weights   01/29/18 1318 01/29/18 1900  Weight: 130 lb (59 kg) 131 lb 6.3 oz (59.6 kg)   Body mass index is 23.28 kg/m.  General: Elderly,  NAD Skin: Warm, dry, intact  Head: Normocephalic, atraumatic,clear, moist mucus membranes. Neck: Negative for carotid bruits. No JVD Lungs:Clear to ausculation bilaterally. No wheezes, rales, or rhonchi. Breathing is unlabored. Cardiovascular: Irregularly irregular with S1 S2. No murmurs, rubs or gallops. Abdomen: Soft, non-tender, non-distended with normoactive bowel sounds. No obvious abdominal masses. MSK: Strength and tone appear normal for age. 5/5 in all extremities Extremities: No edema. No clubbing or cyanosis. DP/PT pulses 2+ bilaterally Neuro: Alert and oriented. No focal deficits. No facial asymmetry. MAE spontaneously. Psych: Responds to questions appropriately with normal affect.    EKG:  The EKG was personally reviewed and demonstrates: 01/29/18 NSR HR 93 Telemetry:  Telemetry was personally reviewed  and demonstrates: 01/30/18 Atrial fibrillation with intermittent sinus tachycardia HR 100's  Relevant CV Studies:  ECHO: 02/28/2014 Study Conclusions  - Left ventricle: The cavity size was normal. Wall thickness  was increased in a pattern of mild LVH. Systolic function was normal. The estimated ejection fraction was in the range of 55% to 60%. - Mitral valve: There was mild regurgitation. - Left atrium: The atrium was severely dilated. - Pulmonary arteries: PA peak pressure: 39 mm Hg (S).  CATH: None  Laboratory Data:  Chemistry Recent Labs  Lab 01/29/18 1343 01/29/18 1630 01/30/18 0302  NA 113* 116* 117*  K 4.0 3.7 3.8  CL 78* 85* 83*  CO2 22 24 23   GLUCOSE 128* 125* 102*  BUN 29* 24* 21*  CREATININE 1.01* 0.71 <0.30*  CALCIUM 8.9 8.2* 8.4*  GFRNONAA 48* >60 NOT CALCULATED  GFRAA 55* >60 NOT CALCULATED  ANIONGAP 13 7 11     Total Protein  Date Value Ref Range Status  01/29/2018 7.2 6.5 - 8.1 g/dL Final  10/11/2017 6.9 6.0 - 8.5 g/dL Final   Albumin  Date Value Ref Range Status  01/29/2018 4.3 3.5 - 5.0 g/dL Final  10/11/2017 4.7 3.5 - 4.7 g/dL Final   AST  Date Value Ref Range Status  01/29/2018 31 15 - 41 U/L Final   ALT  Date Value Ref Range Status  01/29/2018 19 14 - 54 U/L Final   Alkaline Phosphatase  Date Value Ref Range Status  01/29/2018 54 38 - 126 U/L Final   Total Bilirubin  Date Value Ref Range Status  01/29/2018 0.9 0.3 - 1.2 mg/dL Final   Bilirubin Total  Date Value Ref Range Status  10/11/2017 0.3 0.0 - 1.2 mg/dL Final   Hematology Recent Labs  Lab 01/29/18 1343  WBC 8.9  RBC 4.44  HGB 14.1  HCT 38.1  MCV 85.8  MCH 31.8  MCHC 37.0*  RDW 12.1  PLT 248   Cardiac EnzymesNo results for input(s): TROPONINI in the last 168 hours.  Recent Labs  Lab 01/29/18 1351  TROPIPOC 0.00    BNPNo results for input(s): BNP, PROBNP in the last 168 hours.  DDimer No results for input(s): DDIMER in the last 168 hours. TSH:  Lab Results  Component Value Date   TSH 1.491 09/24/2014   Lipids: Lab Results  Component Value Date   CHOL 213 (H) 10/11/2017   HDL 109 10/11/2017   LDLCALC 86 10/11/2017   LDLDIRECT 98.6 07/15/2013   TRIG 88  10/11/2017   CHOLHDL 2.0 10/11/2017   HgbA1c: Lab Results  Component Value Date   HGBA1C 5.6 10/09/2013    Radiology/Studies:  Dg Chest 2 View  Result Date: 01/29/2018 CLINICAL DATA:  UTI.  Altered mental status. EXAM: CHEST - 2 VIEW COMPARISON:  03/31/2017. FINDINGS: The heart is enlarged. Dual lead pacer unchanged. No consolidation or edema. No effusion or pneumothorax. Osteopenia with evidence of previous vertebral augmentation. Chronic appearing T10 and T8 vertebral body fractures. IMPRESSION: Cardiomegaly.  No active disease. Electronically Signed   By: Staci Righter M.D.   On: 01/29/2018 14:38   Assessment and Plan:   1.  Paroxysmal atrial fibrillation: -Patient has a known history of PAF. She was last seen in the office on 01/22/2018 for symptomatic atrial fibrillation.  At that time, patient reported a 2-day history of intermittent shortness of breath and fatigue with no evidence of palpitations. On the morning of the office visit, granddaughter noted an irregular HR of 113 and a BP  of 140/100. The patient took an extra 25 mg of metoprolol as advised by Dr. Acie Fredrickson with symptom resolution and conversion to NSR.  EKG at the time was performed which confirmed sinus rhythm. Recommendations were made to continue BB and CCB at the current dose with extra metoprolol as needed if symptomatic Afib recurred. -Presented during this admission for severe hyponatremia likely the cause of her intermittent atrial fibrillation. -Heart rate controlled, 90's-100's with intermittent periods of NSR/PAC's/AF -Continue Eliquis 2.5 mg twice daily -Continue diltiazem CD 240 mg daily, metoprolol tartrate 25 mg twice daily -CHA2DS2VASc =5 (age+2, sex, HTN, vascular disease)  2.  Hyponatremia with history of chronic hyponatremia: -Patient presented with complaints of generalized weakness and lethargy -Alert and oriented today  -Na+ today, 117, up from 113 on admission -NS infusing today, if BMP with low  sodium>>nephrology to start 3% normal saline per chart review  3.  Hypertension: -Elevated, 160/79, 161/108, 168/75 -Diltiazem CD 240 mg, metoprolol tartrate 25 mg  4.  HLD: -Stable, continue Crestor   For questions or updates, please contact [facility] Please consult [email or web address] for contact info under Cardiology/STEMI.   SignedKathyrn Drown NP-C [place] Pager: [phone] 01/30/2018 9:20 AM  The patient was seen, examined and discussed with Kathyrn Drown, NP  and I agree with the above.   84 y.o. female with a hx of CAD s/p CABG (2007), paroxysmal atrial fibrillation/atrial tachycardia, history of tachybradycardia syndrome s/p PPM, GI bleed, diverticulitis, HTN, HLD and chronic hyponatremia who is being seen today for the evaluation of atrial fibrillation at the request of Dr. Erlinda Hong. Known history of amiodarone allergy. On Eliquis despite history of GI bleed 2012 per primary cardiologist. No falls.  Patient of Dr Acie Fredrickson, last seen on 6/10 when found in a-fib with RVR, admitted on 01/29/2018 with altered mental status for several days. Diagnosed with UTI,decreased appetite and PO intake during that time. Found to be in hyponatremia of 110, cardiology has been consulted given her history of paroxysmal atrial fibrillation. She is currently receiving NaCl infusion, Na is improving, now 117, she is more oriented and feels better. BP is elevated, I would continue cardizem CD 240 mg po daily, increase metoprolol to 37.5 mg PO BID, continue replace fluids and NaCl infusion, continue anticoagulation with Eliquis 2.5 mg po BID, we wil consider cardioversion once she recovers from UTI and hyponatremia.   Ena Dawley, MD 01/30/2018

## 2018-01-31 ENCOUNTER — Other Ambulatory Visit: Payer: Self-pay

## 2018-01-31 DIAGNOSIS — I482 Chronic atrial fibrillation: Secondary | ICD-10-CM

## 2018-01-31 DIAGNOSIS — R410 Disorientation, unspecified: Secondary | ICD-10-CM

## 2018-01-31 DIAGNOSIS — I10 Essential (primary) hypertension: Secondary | ICD-10-CM

## 2018-01-31 DIAGNOSIS — E876 Hypokalemia: Secondary | ICD-10-CM

## 2018-01-31 LAB — MAGNESIUM: Magnesium: 1.6 mg/dL — ABNORMAL LOW (ref 1.7–2.4)

## 2018-01-31 LAB — BASIC METABOLIC PANEL
ANION GAP: 8 (ref 5–15)
BUN: 21 mg/dL — ABNORMAL HIGH (ref 6–20)
CALCIUM: 8.4 mg/dL — AB (ref 8.9–10.3)
CO2: 24 mmol/L (ref 22–32)
Chloride: 89 mmol/L — ABNORMAL LOW (ref 101–111)
Creatinine, Ser: 0.75 mg/dL (ref 0.44–1.00)
Glucose, Bld: 105 mg/dL — ABNORMAL HIGH (ref 65–99)
Potassium: 3.4 mmol/L — ABNORMAL LOW (ref 3.5–5.1)
SODIUM: 121 mmol/L — AB (ref 135–145)

## 2018-01-31 LAB — URINE CULTURE: Culture: NO GROWTH

## 2018-01-31 LAB — TSH: TSH: 0.846 u[IU]/mL (ref 0.350–4.500)

## 2018-01-31 MED ORDER — TRAMADOL HCL 50 MG PO TABS
50.0000 mg | ORAL_TABLET | Freq: Once | ORAL | Status: AC
  Administered 2018-01-31: 50 mg via ORAL
  Filled 2018-01-31: qty 1

## 2018-01-31 MED ORDER — SODIUM CHLORIDE 0.9 % IV SOLN
INTRAVENOUS | Status: DC
  Administered 2018-01-31: 75 mL/h via INTRAVENOUS
  Administered 2018-01-31: 22:00:00 via INTRAVENOUS

## 2018-01-31 MED ORDER — DILTIAZEM HCL ER COATED BEADS 180 MG PO CP24
360.0000 mg | ORAL_CAPSULE | Freq: Every day | ORAL | Status: DC
  Administered 2018-01-31 – 2018-02-08 (×9): 360 mg via ORAL
  Filled 2018-01-31 (×9): qty 2

## 2018-01-31 MED ORDER — POTASSIUM CHLORIDE CRYS ER 20 MEQ PO TBCR
40.0000 meq | EXTENDED_RELEASE_TABLET | Freq: Once | ORAL | Status: AC
  Administered 2018-01-31: 40 meq via ORAL
  Filled 2018-01-31: qty 2

## 2018-01-31 MED ORDER — MAGNESIUM SULFATE 2 GM/50ML IV SOLN
2.0000 g | Freq: Once | INTRAVENOUS | Status: AC
  Administered 2018-01-31: 2 g via INTRAVENOUS
  Filled 2018-01-31: qty 50

## 2018-01-31 MED ORDER — METOPROLOL TARTRATE 25 MG PO TABS
50.0000 mg | ORAL_TABLET | Freq: Two times a day (BID) | ORAL | Status: DC
  Administered 2018-01-31 – 2018-02-01 (×3): 50 mg via ORAL
  Filled 2018-01-31 (×3): qty 2

## 2018-01-31 NOTE — Progress Notes (Signed)
PROGRESS NOTE    Donna Ortega  GEX:[id number] DOB: 1926/05/27 DOA: 01/29/2018 PCP: Lanice Shirts, MD   Brief Narrative:  Donna Ortega is a 84 y.o. female with medical history significant of chronic hyponatremia, CAD, three-vessel CABG, GI bleed, diverticulitis, history of A. fib, hypertension and other comorbidites who was admitted with altered mental status.  Patient was brought in by her daughter-in-law who lives with the patient.  According to daughter-in-law patient had altered mental status few days ago to go to urgent care diagnosed with UTI was given antibiotics with results.  She has taken 3 doses of her antibiotic.  Patient had been more and more lethargic over the last few days.  She had to climb 15 steps to get out of the house she was very scared to take her upstairs because of falls. Has been having generalized weakness and was found to have a Na+ of 113. On Admission. Admitted for Metabolic Encephalopathy, Acute on Chronic Hyponatremia, and Atrial Fibrillation. Cardiology consulted and adjusting medications and considering Cardioversion once she recovers from Hyponatremia. Currently receiving IVF Hydration with NS at 75 mL/hr.   Assessment & Plan:   Active Problems:   Atrial fibrillation, chronic (HCC)   Hyponatremia  Metabolic Encephalopathy in the setting of Acute on Chronic Hyponatremia, improving -Appeared dry on Admission, with reported poor oral intake -Sodium on presentation was 113 and improving with gentle IVF Hydration with NS at 75 mL/hr; Sodium is now 121 -Will continue IVF for another 24 hours and re-evaluate -Urinalysis Unremarkable and Urine Cx Negative  -Per admitting MD discussion with Dr. Hollie Salk, Nephrology will not formally consult at this time but if patient worsens or Sodium drops will discuss with Nephrology further  Paroxysmal Atrial Fibrillation with slight RVR Hx of Atrial Tachycardia Hc of SSS s/p PPM -On BB and Anticoagulation with  Apixaban -Currently in A Fib -Cardiology consulted, may consider cardioversion if sodium improves, meds adjustment per cardiology and have increased Diltaizem to 360 mg po daily and Increased Metoprolol Tartrate to 50 mg po BID -C/w with Telemetry   AKI on CKDII, improving  -Likely from dehydration -She was Recently treated for uti with Omnicef,  -Urinalysis on presentation was unimpressive -BUN/Cr 29/1.01 on presentation and now BUN/Cr is 21/0.75 -Continue Gentle IVF Hydration with NS at 75 mL/hr  FTT/Generalized Weakness -Has reported poor oral intake; Consult Nutritionist  -PT/OT evaluated and recommending SNF  HTN -Continue with Diltiazem 360 mill grams p.o. daily along with Metoprolol Tartrate 50 mg p.o. twice daily  HLD -Continue with Rosuvastatin 10 mg p.o. daily  Hypokalemia -Patient's potassium this morning was 3.4 -Replete with p.o. potassium chloride 40 mEq x1 dose -Continue to monitor and replete as necessary -Repeat CMP in a.m.  Hypomagnesemia -Patient's Mag Level this AM was 1.6 -Replete with IV Mag Sulfate 2 grams  -Continue to Monitor and Replete as Necessary -Repeat Mag Level in AM   Recent UTI, s/p treatment -Received 3 Doses of Abx as an outpatient -Urinalysis and Urine Cx unremarkable here.   DVT prophylaxis: Anticoagulated with Apxiaban 2.5 mg po BID  Code Status: DO NOT RESUSCITATE Family Communication: Discussed with Son at Bedside Disposition Plan: Anticipate D/C to SNF in next 48 hours if Medically Stable and no other recommendations by Cardiology   Consultants:   Cardiology Dr. Meda Coffee   Procedures: None   Antimicrobials: Anti-infectives (From admission, onward)   None     Subjective: Seen and examined at bedside and was awake and alert and oriented  x3.  Still complained of some generalized weakness and was afraid to ambulate.  Family very concerned about her safety at home and are inquiring about skilled nursing facility placement.   No chest pain, shortness breath, nausea, vomiting.  No other concerns or complaints at this time.  Objective: Vitals:   01/31/18 0300 01/31/18 0400 01/31/18 0500 01/31/18 0600  BP:  (!) 175/76  (!) 184/90  Pulse: 80 81 84 91  Resp: 15 17  19   Temp:  98.2 F (36.8 C)    TempSrc:  Oral    SpO2: 100% 100% 100% 99%  Weight:      Height:        Intake/Output Summary (Last 24 hours) at 01/31/2018 0739 Last data filed at 01/31/2018 0600 Gross per 24 hour  Intake 1630 ml  Output -  Net 1630 ml   Filed Weights   01/29/18 1318 01/29/18 1900  Weight: 59 kg (130 lb) 59.6 kg (131 lb 6.3 oz)   Examination: Physical Exam:  Constitutional: Elderly Caucasian female in NAD and appears calm and comfortable sitting in chair bedside eating breakfast Eyes: Lids and conjunctivae normal, sclerae anicteric  ENMT: External Ears, Nose appear normal. Grossly normal hearing. Neck: Appears normal, supple, no cervical masses, normal ROM, no appreciable thyromegaly, no JVD Respiratory: Diminished to auscultation bilaterally, no wheezing, rales, rhonchi or crackles. Normal respiratory effort and patient is not tachypenic. No accessory muscle use.  Cardiovascular: Irregularly Irregular and tachycardic, no murmurs / rubs / gallops. S1 and S2 auscultated. Trace extremity edema. Abdomen: Soft, non-tender, non-distended. No masses palpated. No appreciable hepatosplenomegaly. Bowel sounds positive x4.  GU: Deferred. Musculoskeletal: No clubbing / cyanosis of digits/nails. No joint deformity upper and lower extremities.  Skin: No rashes, lesions, ulcers on a limited skin evaluation. No induration; Warm and dry.  Neurologic: CN 2-12 grossly intact with no focal deficits.  Romberg sign and cerebellar reflexes not assessed.  Psychiatric: Normal judgment and insight. Awake, Alert, and oriented x 3. Normal mood and appropriate affect.   Data Reviewed: I have personally reviewed following labs and imaging  studies  CBC: Recent Labs  Lab 01/29/18 1343  WBC 8.9  NEUTROABS 6.9  HGB 14.1  HCT 38.1  MCV 85.8  PLT 664   Basic Metabolic Panel: Recent Labs  Lab 01/29/18 1343 01/29/18 1630 01/30/18 0302 01/31/18 0315  NA 113* 116* 117* 121*  K 4.0 3.7 3.8 3.4*  CL 78* 85* 83* 89*  CO2 22 24 23 24   GLUCOSE 128* 125* 102* 105*  BUN 29* 24* 21* 21*  CREATININE 1.01* 0.71 <0.30* 0.75  CALCIUM 8.9 8.2* 8.4* 8.4*  MG  --   --   --  1.6*   GFR: Estimated Creatinine Clearance: 39.4 mL/min (by C-G formula based on SCr of 0.75 mg/dL). Liver Function Tests: Recent Labs  Lab 01/29/18 1343  AST 31  ALT 19  ALKPHOS 54  BILITOT 0.9  PROT 7.2  ALBUMIN 4.3   No results for input(s): LIPASE, AMYLASE in the last 168 hours. No results for input(s): AMMONIA in the last 168 hours. Coagulation Profile: Recent Labs  Lab 01/29/18 1343  INR 0.96   Cardiac Enzymes: No results for input(s): CKTOTAL, CKMB, CKMBINDEX, TROPONINI in the last 168 hours. BNP (last 3 results) No results for input(s): PROBNP in the last 8760 hours. HbA1C: No results for input(s): HGBA1C in the last 72 hours. CBG: Recent Labs  Lab 01/29/18 1323  GLUCAP 147*   Lipid Profile: No results for  input(s): CHOL, HDL, LDLCALC, TRIG, CHOLHDL, LDLDIRECT in the last 72 hours. Thyroid Function Tests: Recent Labs    01/31/18 0315  TSH 0.846   Anemia Panel: No results for input(s): VITAMINB12, FOLATE, FERRITIN, TIBC, IRON, RETICCTPCT in the last 72 hours. Sepsis Labs: Recent Labs  Lab 01/29/18 1345  LATICACIDVEN 1.68    Recent Results (from the past 240 hour(s))  Culture, blood (Routine x 2)     Status: None (Preliminary result)   Collection Time: 01/29/18  1:43 PM  Result Value Ref Range Status   Specimen Description   Final    BLOOD RIGHT HAND Performed at [facility] [address], [place], [place] [address]    Special Requests   Final    BOTTLES DRAWN AEROBIC AND ANAEROBIC Blood  Culture results may not be optimal due to an inadequate volume of blood received in culture bottles Performed at [facility] [address], [place], [place] [address]    Culture   Final    NO GROWTH < 24 HOURS Performed at [facility] [address], [place], [place] [address]    Report Status PENDING  Incomplete  Culture, blood (Routine x 2)     Status: None (Preliminary result)   Collection Time: 01/29/18  1:43 PM  Result Value Ref Range Status   Specimen Description   Final    BLOOD LEFT ANTECUBITAL Performed at [facility] [address], [place], [place] [address]    Special Requests   Final    BOTTLES DRAWN AEROBIC ONLY Blood Culture results may not be optimal due to an inadequate volume of blood received in culture bottles Performed at [facility] [address], [place], [place] [address]    Culture   Final    NO GROWTH < 24 HOURS Performed at [facility] [address] [place], [place] [address]    Report Status PENDING  Incomplete  MRSA PCR Screening     Status: None   Collection Time: 01/29/18  6:56 PM  Result Value Ref Range Status   MRSA by PCR NEGATIVE NEGATIVE Final    Comment:        The GeneXpert MRSA Assay (FDA approved for NASAL specimens only), is one component of a comprehensive MRSA colonization surveillance program. It is not intended to diagnose MRSA infection nor to guide or monitor treatment for MRSA infections. Performed at [facility], [place] [address], [place], [place] [address]     Radiology Studies: Dg Chest 2 View  Result Date: 01/29/2018 CLINICAL DATA:  UTI.  Altered mental status. EXAM: CHEST - 2 VIEW COMPARISON:  03/31/2017. FINDINGS: The heart is enlarged. Dual lead pacer unchanged. No consolidation or edema. No effusion or pneumothorax. Osteopenia with evidence of previous vertebral augmentation. Chronic appearing T10 and T8 vertebral body  fractures. IMPRESSION: Cardiomegaly.  No active disease. Electronically Signed   By: Staci Righter M.D.   On: 01/29/2018 14:38   Scheduled Meds: . apixaban  2.5 mg Oral BID  . diltiazem  240 mg Oral Daily  . metoprolol tartrate  37.5 mg Oral BID  . pantoprazole  40 mg Oral Daily  . rosuvastatin  10 mg Oral Daily   Continuous Infusions: . sodium chloride 75 mL/hr at 01/31/18 0600    LOS: 2 days   Kerney Elbe, DO [facility] Pager [phone]  If 7PM-7AM, please contact night-coverage [email or web address] Password [facility] 01/31/2018, 7:39 AM

## 2018-01-31 NOTE — Progress Notes (Signed)
CSW met with the patient at bedside to discuss his discharge plan to SNF.Patient oriented to place and self. Patient states she prefers to go home but will go to rehab "if the doctor thinks that is best." Patient gave CSW permission to contact her son to start planning discharge to rehab at [facility]. CSW called son [name], and left voicemail. Waiting for a return phone call.  Kathrin Greathouse, Latanya Presser, MSW Clinical Social Worker  [phone] 01/31/2018  3:07 PM

## 2018-01-31 NOTE — Progress Notes (Signed)
SON AT BEDSIDE; UPDATED TO PATIENT STATUS WITH ALLOWANCE FOR ALL QUESTIONS TO BE ASKED AND ANSWERED. INCLUDING CONFUSION REGARDING SITUATION, TIME, AND PLACE. AGREES WITH POC.

## 2018-01-31 NOTE — Clinical Social Work Note (Signed)
Clinical Social Work Assessment  Patient Details  Name: Donna Ortega MRN: [id number] Date of Birth: 1925-09-02  Date of referral:  01/31/18               Reason for consult:  Facility Placement, Discharge Planning                Permission sought to share information with:  Family Supports Permission granted to share information::  Yes, Verbal Permission Granted  Name::        Agency::  SNF  Relationship::  Son-Donna Ortega  Contact Information:     Housing/Transportation Living arrangements for the past 2 months:  [place] of Information:  Patient, Adult Children Patient Interpreter Needed:  None Criminal Activity/Legal Involvement Pertinent to Current Situation/Hospitalization:  No - Comment as needed Significant Relationships:  Adult Children Lives with:  Self Do you feel safe going back to the place where you live?  Yes Need for family participation in patient care:  Yes (Comment)  Care giving concerns:   Patient chief complaint: Altered Mental Status   According to the daughter-in-law patient had altered mental status few days ago to go to urgent care diagnosed with UTI was given antibiotics with results.  She has taken 3 doses of her antibiotic.  Patient has been more and more lethargic over the last few days.  She has to climb 15 steps to get out of the house she was very scared to take her upstairs because of falls.   PT evaluated current level of function and recommends SNF for short rehab.   Social Worker assessment / plan:  CSW discussed discharge with patient son per patient request. Patient son-Donna Ortega states the patient lives alone and will need addition support with rehab before returning home. Patient son reports prior to this hospitalization the patient was very independent and still driving. Patient son reports this is the first time she will have to go to a facility. He prefers the patient go to a facility that is in network with her insurance. He  requested CSW inquire about placement at [facility]  CSW informed patient son of the SNF process and insurance authorization process. He reports understanding.    Plan: SNF placement for short rehab.   Employment status:  Retired Nurse, adult PT Recommendations:  [place] / Referral to community resources:  [place]  Patient/Family's Response to care: Agreeable and responding well to care.   Patient/Family's Understanding of and Emotional Response to Diagnosis, Current Treatment, and Prognosis: Patient son reports "this is not her usual self, she is a very independent person." Patient currently has an altered state of mind but son reports she is usually "with it."Patient son has a good understanding of patient diagnosis and current treatment plan.   Emotional Assessment Appearance:  Appears younger than stated age Attitude/Demeanor/Rapport:    Affect (typically observed):  Calm Orientation:  Oriented to Self, Oriented to Place Alcohol / Substance use:  Not Applicable Psych involvement (Current and /or in the community):  No (Comment)  Discharge Needs  Concerns to be addressed:  Discharge Planning Concerns Readmission within the last 30 days:  No Current discharge risk:  Dependent with Mobility Barriers to Discharge:  Continued Medical Work up   [employer], LCSW 01/31/2018, 3:37 PM

## 2018-01-31 NOTE — Evaluation (Signed)
Occupational Therapy Evaluation Patient Details Name: Donna Ortega MRN: [id number] DOB: October 30, 1925 Today's Date: 01/31/2018    History of Present Illness Donna Ortega is a 84 y.o. female with medical history significant of chronic hyponatremia, CAD, three-vessel CABG, GI bleed, diverticulitis, history of A. fib, hypertension admitted with altered mental status on 01/29/18, found to have sodium 113, recent  urgent care visit forUTI.   Clinical Impression   Pt was admitted for the above.  Unsure of PLOF.  Pt states she lives in basement apt of son's house and is independent with adls.  She needs min A for adls and cues for safety at this time. Goals are for supervision level goals in acute. Will follow with the goals listed below    Follow Up Recommendations  SNF;Supervision/Assistance - 24 hour    Equipment Recommendations  (to be further assessed)    Recommendations for Other Services       Precautions / Restrictions Precautions Precautions: Fall Precaution Comments: monitor BP Restrictions Weight Bearing Restrictions: No      Mobility Bed Mobility           Sit to supine: Min assist   General bed mobility comments: for legs  Transfers   Equipment used: Rolling walker (2 wheeled)   Sit to Stand: Min assist Stand pivot transfers: Min assist       General transfer comment: steadying assistance especially when backing up    Balance                                           ADL either performed or assessed with clinical judgement   ADL Overall ADL's : Needs assistance/impaired Eating/Feeding: Independent   Grooming: Oral care;Wash/dry hands;Set up;Sitting   Upper Body Bathing: Set up;Sitting   Lower Body Bathing: Minimal assistance;Sit to/from stand   Upper Body Dressing : Minimal assistance;Sitting(due to lines)   Lower Body Dressing: Minimal assistance;Sit to/from stand   Toilet Transfer: Minimal assistance;Stand-pivot;BSC;RW    Toileting- Clothing Manipulation and Hygiene: Minimal assistance;Sit to/from stand         General ADL Comments: steadying assistance for adls.  Cues for safety     Vision         Perception     Praxis      Pertinent Vitals/Pain Pain Assessment: No/denies pain     Hand Dominance     Extremity/Trunk Assessment Upper Extremity Assessment Upper Extremity Assessment: Generalized weakness           Communication Communication Communication: No difficulties   Cognition Arousal/Alertness: Awake/alert Behavior During Therapy: Impulsive Overall Cognitive Status: No family/caregiver present to determine baseline cognitive functioning                                 General Comments: cues for safety   General Comments       Exercises     Shoulder Instructions      Home Living Family/patient expects to be discharged to:: Unsure               Alternate Level Stairs-Number of Steps: 15 to mother inlaw apartment             Home Equipment: Kasandra Knudsen - quad   Additional Comments: states son lives upstairs.      Prior Functioning/Environment Level of Independence:  Independent with assistive device(s)        Comments: Pt states that she is independent; no family available to verify        OT Problem List: Decreased strength;Decreased activity tolerance;Impaired balance (sitting and/or standing);Decreased safety awareness      OT Treatment/Interventions: Self-care/ADL training;DME and/or AE instruction;Patient/family education;Balance training;Therapeutic activities    OT Goals(Current goals can be found in the care plan section) Acute Rehab OT Goals Patient Stated Goal: none stated OT Goal Formulation: With patient Time For Goal Achievement: 02/14/18 Potential to Achieve Goals: Good ADL Goals Pt Will Perform Grooming: with supervision;standing Pt Will Transfer to Toilet: with supervision;ambulating;bedside commode Additional ADL Goal  #1: pt will complete adl with supervision, sit to stand  OT Frequency: Min 2X/week   Barriers to D/C:            Co-evaluation              AM-PAC PT "6 Clicks" Daily Activity     Outcome Measure Help from another person eating meals?: None Help from another person taking care of personal grooming?: A Little Help from another person toileting, which includes using toliet, bedpan, or urinal?: A Little Help from another person bathing (including washing, rinsing, drying)?: A Little Help from another person to put on and taking off regular upper body clothing?: A Little Help from another person to put on and taking off regular lower body clothing?: A Little 6 Click Score: 19   End of Session    Activity Tolerance: Patient tolerated treatment well Patient left: in bed;with call bell/phone within reach;with bed alarm set  OT Visit Diagnosis: Muscle weakness (generalized) (M62.81)                Time: 8937-3428 OT Time Calculation (min): 20 min Charges:  OT General Charges $OT Visit: 1 Visit OT Evaluation $OT Eval Low Complexity: 1 Low G-Codes:     [place], OTR/L [phone] 01/31/2018  Yaritza Leist 01/31/2018, 2:50 PM

## 2018-01-31 NOTE — Progress Notes (Signed)
Progress Note  Patient Name: Donna Ortega Date of Encounter: 01/31/2018  Primary Cardiologist: Dr. Grayland Jack, MD   Subjective   She feels confused, doesn't understand why she is here but knows where she is. Denies palpitations, SOB or chest pain.  Inpatient Medications    Scheduled Meds: . apixaban  2.5 mg Oral BID  . diltiazem  240 mg Oral Daily  . metoprolol tartrate  37.5 mg Oral BID  . pantoprazole  40 mg Oral Daily  . rosuvastatin  10 mg Oral Daily   Continuous Infusions: . sodium chloride 75 mL/hr at 01/31/18 0600   PRN Meds: acetaminophen, diphenhydrAMINE   Vital Signs    Vitals:   01/31/18 0300 01/31/18 0400 01/31/18 0500 01/31/18 0600  BP:  (!) 175/76  (!) 184/90  Pulse: 80 81 84 91  Resp: 15 17  19   Temp:  98.2 F (36.8 C)    TempSrc:  Oral    SpO2: 100% 100% 100% 99%  Weight:      Height:        Intake/Output Summary (Last 24 hours) at 01/31/2018 0735 Last data filed at 01/31/2018 0600 Gross per 24 hour  Intake 1630 ml  Output -  Net 1630 ml   Filed Weights   01/29/18 1318 01/29/18 1900  Weight: 130 lb (59 kg) 131 lb 6.3 oz (59.6 kg)   Physical Exam   General: Elderly, frail, NAD Skin: Warm, dry, intact  Head: Normocephalic, atraumatic, clear, moist mucus membranes. Neck: Negative for carotid bruits. No JVD Lungs:Clear to ausculation bilaterally. No wheezes, rales, or rhonchi. Breathing is unlabored. Cardiovascular: Irregularly irregular with S1 S2. No murmurs, rubs or gallops Abdomen: Soft, non-tender, non-distended with normoactive bowel sounds. No obvious abdominal masses. MSK: Strength and tone appear normal for age. 5/5 in all extremities Extremities: No edema. No clubbing or cyanosis. DP/PT pulses 2+ bilaterally Neuro: Alert and oriented. No focal deficits. No facial asymmetry. MAE spontaneously. Psych: Responds to questions appropriately with normal affect.    Labs    Chemistry Recent Labs  Lab 01/29/18 1343  01/29/18 1630 01/30/18 0302 01/31/18 0315  NA 113* 116* 117* 121*  K 4.0 3.7 3.8 3.4*  CL 78* 85* 83* 89*  CO2 22 24 23 24   GLUCOSE 128* 125* 102* 105*  BUN 29* 24* 21* 21*  CREATININE 1.01* 0.71 <0.30* 0.75  CALCIUM 8.9 8.2* 8.4* 8.4*  PROT 7.2  --   --   --   ALBUMIN 4.3  --   --   --   AST 31  --   --   --   ALT 19  --   --   --   ALKPHOS 54  --   --   --   BILITOT 0.9  --   --   --   GFRNONAA 48* >60 NOT CALCULATED >60  GFRAA 55* >60 NOT CALCULATED >60  ANIONGAP 13 7 11 8     Hematology Recent Labs  Lab 01/29/18 1343  WBC 8.9  RBC 4.44  HGB 14.1  HCT 38.1  MCV 85.8  MCH 31.8  MCHC 37.0*  RDW 12.1  PLT 248   Cardiac EnzymesNo results for input(s): TROPONINI in the last 168 hours.  Recent Labs  Lab 01/29/18 1351  TROPIPOC 0.00    BNPNo results for input(s): BNP, PROBNP in the last 168 hours.   DDimer No results for input(s): DDIMER in the last 168 hours.   Radiology    Dg  Chest 2 View  Result Date: 01/29/2018 CLINICAL DATA:  UTI.  Altered mental status. EXAM: CHEST - 2 VIEW COMPARISON:  03/31/2017. FINDINGS: The heart is enlarged. Dual lead pacer unchanged. No consolidation or edema. No effusion or pneumothorax. Osteopenia with evidence of previous vertebral augmentation. Chronic appearing T10 and T8 vertebral body fractures. IMPRESSION: Cardiomegaly.  No active disease. Electronically Signed   By: Staci Righter M.D.   On: 01/29/2018 14:38   Telemetry    A-fib, rate controlled 80-95 BPM - Personally Reviewed  ECG    No new tracings as of 01/31/18 - Personally Reviewed  Cardiac Studies   ECHO: 02/28/2014 Study Conclusions  - Left ventricle: The cavity size was normal. Wall thickness was increased in a pattern of mild LVH. Systolic function was normal. The estimated ejection fraction was in the range of 55% to 60%. - Mitral valve: There was mild regurgitation. - Left atrium: The atrium was severely dilated. - Pulmonary arteries: PA peak  pressure: 39 mm Hg (S).  CATH: None  Patient Profile     84 y.o. female with a hx of CAD s/p CABG (2007), paroxysmal atrial fibrillation/atrial tachycardia, history of tachybradycardia syndrome s/p PPM, GI bleed, diverticulitis, HTN, HLD and chronic hyponatremia who is being seen today for the evaluation of atrial fibrillation at the request of Dr. Erlinda Hong.   Known history of amiodarone allergy. On Eliquis despite history of GI bleed 2012 per primary cardiologist.  Assessment & Plan    1.  Paroxysmal atrial fibrillation: -Patient has a known history of PAF. She was last seen in the office on 01/22/2018 for symptomatic atrial fibrillation.  At that time, patient reported a 2-day history of intermittent shortness of breath and fatigue with no evidence of palpitations. On the morning of the office visit, granddaughter noted an irregular HR of 113 and a BP of 140/100. The patient took an extra 25 mg of metoprolol as advised by Dr. Acie Fredrickson with symptom resolution and conversion to NSR.  EKG at the time was performed which confirmed sinus rhythm. Recommendations were made to continue BB and CCB at the current dose with extra metoprolol as needed if symptomatic Afib recurred. -Presented during this admission for severe hyponatremia likely the cause of her intermittent atrial fibrillation. -Heart rate controlled, 90's-100's with intermittent periods of NSR/PAC's/AF -Continue Eliquis 2.5 mg twice daily -Continue diltiazem CD 240 mg daily, metoprolol tartrate increased yesterday to 37.5 mg twice daily>>will consider increasing to 50 mg twice daily given her persistent hypertension  -CHA2DS2VASc =5 (age+2, sex, HTN, vascular disease)  2.  Hyponatremia with history of chronic hyponatremia: -Patient presented with complaints of generalized weakness and lethargy -Alert and oriented today  -Na+ 121 today, up from 113 on admission -NS infusing today  3.  Hypertension: -Elevated,  184/90>175/76>159/81 -Diltiazem CD 240 mg, will increase metoprolol to 50mg  twice daily and monitor   4.  HLD: -Stable, continue Crestor  5. Hypokalemia: -K+ 3.4 today. Will replete with KDUR 31meq x one today  -Continue daily monitoring   Signed, Kathyrn Drown NP-C [facility] Pager: [phone] 01/31/2018, 7:35 AM     For questions or updates, please contact   Please consult [email or web address] for contact info under Cardiology/STEMI.  The patient was seen, examined and discussed with Kathyrn Drown, NP  and I agree with the above.   84 y.o. female with a hx of CAD s/p CABG (2007), paroxysmal atrial fibrillation/atrial tachycardia, history of tachybradycardia syndrome s/p PPM, GI bleed, diverticulitis, HTN, HLD and chronic hyponatremia who is  being seen today for the evaluation of atrial fibrillation at the request of Dr. Erlinda Hong. Known history of amiodarone allergy. On Eliquis despite history of GI bleed 2012 per primary cardiologist. No falls.  Patient of Dr Acie Fredrickson, last seen on 6/10 when found in a-fib with RVR, admitted on 01/29/2018 with altered mental status for several days. Diagnosed with UTI,decreased appetite and PO intake during that time. Found to be in hyponatremia of 110, cardiology has been consulted given her history of paroxysmal atrial fibrillation.  She is currently still receiving NaCl infusion, Na is improving, now 121, she is more oriented and feels better. BP is very elevated, HR better controlled but still in 80-95 BPM, I would increase cardizem CD 360 mg po daily, increase metoprolol to 50 mg PO BID, continue replace fluids and NaCl infusion, continue anticoagulation with Eliquis 2.5 mg po BID, we wil consider cardioversion once she recovers from UTI and hyponatremia.   Ena Dawley, MD 01/31/2018

## 2018-01-31 NOTE — Care Management Note (Addendum)
Case Management Note  Patient Details  Name: Donna Ortega MRN: [id number] Date of Birth: 05/06/1926  Subjective/Objective:                  Son state that patient lives alone and can not take care of herself.  Action/Plan: Referral to csw done for possible snf placement SON-Don Awan at [phone] Expected Discharge Date:  (unknown)               Expected Discharge Plan:     In-House Referral:     Discharge planning Services     Post Acute Care Choice:    Choice offered to:     DME Arranged:    DME Agency:     HH Arranged:    HH Agency:     Status of Service:     If discussed at [employer], dates discussed:    Additional Comments:  Leeroy Cha, RN 01/31/2018, 9:21 AM

## 2018-01-31 NOTE — Progress Notes (Signed)
OT Cancellation Note  Patient Details Name: Donna Ortega MRN: [id number] DOB: Feb 20, 1926   Cancelled Treatment:    Reason Eval/Treat Not Completed: Other (comment). Pt just got lunch.  Will check back later this afternoon.    [place] 01/31/2018, 1:15 PM  Lesle Chris, OTR/L [phone] 01/31/2018

## 2018-01-31 NOTE — NC FL2 (Signed)
[place] LEVEL OF CARE SCREENING TOOL     IDENTIFICATION  Patient Name: Donna Ortega Birthdate: 04/04/1926 Sex: female Admission Date (Current Location): 01/29/2018  [facility] and [place] Number:  Herbalist and Address:  [facility],  [address] [place], [place]      Provider Number: [id number]  Attending Physician Name and Address:  Kerney Elbe, DO  Relative Name and Phone Number:       Current Level of Care: Hospital Recommended Level of Care: [place] Prior Approval Number:    Date Approved/Denied:   PASRR Number:    Discharge Plan:      Current Diagnoses: Patient Active Problem List   Diagnosis Date Noted  . Hypovolemia   . AKI (acute kidney injury) ([place]) 04/01/2017  . Small bowel obstruction ([place]) 03/31/2017  . Edema 04/09/2014  . Vertebral compression fracture ([place]) 10/29/2013  . Compressed spine fracture ([place]) 10/29/2013  . Gastric erosions 10/09/2013  . Decreased hearing of both ears 10/09/2013  . Normocytic anemia 06/26/2013  . Osteoporosis  Fosamax restarted 10/2013 06/18/2013  . Osteopenia 05/05/2013  . Palpitation 02/19/2013  . Insomnia 02/18/2013  . Candida esophagitis ([place]) 01/22/2013  . Gastric ulcer 01/22/2013  . UTI (urinary tract infection) 01/17/2013  . HLD (hyperlipidemia) 06/19/2012  . Atrial tachycardia ([place]) 05/22/2012  . Pacemaker-Medtronic 02/09/2012  . Sinus node dysfunction ([place]) 02/06/2012  . Syncope and collapse 02/06/2012  . Benign hypertension 09/27/2011  . Rash 09/19/2011  . UTI (lower urinary tract infection) 08/21/2011  . Fatigue 08/21/2011  . Peptic ulcer disease with hemorrhage 08/14/2011  . S/P vertebroplasty 08/14/2011  . H/O Spinal surgery 08/14/2011  . Chronic kidney disease (CKD), stage III (moderate) ([place]) 08/05/2011  . Anemia associated with acute blood loss 08/04/2011  . Hyponatremia 08/04/2011  . GI bleed 08/04/2011  . Dyslipidemia 08/04/2011   . Melena 08/03/2011  . CAD (coronary artery disease) s/p CABG 01/11/2011  . Atrial fibrillation, chronic ([place]) 01/11/2011  . HTN (hypertension) 01/11/2011  . Arteriosclerosis of coronary artery 01/11/2011    Orientation RESPIRATION BLADDER Height & Weight     Self, Place  Normal Continent Weight: 131 lb 6.3 oz (59.6 kg) Height:  5\' 3"  (160 cm)  BEHAVIORAL SYMPTOMS/MOOD NEUROLOGICAL BOWEL NUTRITION STATUS      Continent Diet(Regular Diet )  AMBULATORY STATUS COMMUNICATION OF NEEDS Skin   Extensive Assist Verbally Normal                       Personal Care Assistance Level of Assistance  Bathing, Dressing, Feeding Bathing Assistance: Limited assistance Feeding assistance: Independent Dressing Assistance: Limited assistance     Functional Limitations Info  Sight, Hearing, Speech Sight Info: Adequate Hearing Info: Adequate Speech Info: Adequate    SPECIAL CARE FACTORS FREQUENCY  PT (By licensed PT), OT (By licensed OT)     PT Frequency: 5x/week OT Frequency: 5x/week             Contractures Contractures Info: Not present    Additional Factors Info  Code Status, Allergies Code Status Info: DNR Allergies Info: Allergies: Amiodarone, Aspirin, Lipitor Atorvastatin Calcium, Nsaids, Tolmetin, Naproxen, Niacin, Niaspan Niacin Er           Current Medications (01/31/2018):  This is the current hospital active medication list Current Facility-Administered Medications  Medication Dose Route Frequency Provider Last Rate Last Dose  . 0.9 %  sodium chloride infusion   Intravenous Continuous Raiford Noble [place],  DO      . acetaminophen (TYLENOL) tablet 650 mg  650 mg Oral Q6H PRN Gardiner Barefoot, NP   650 mg at 01/29/18 2310  . apixaban (ELIQUIS) tablet 2.5 mg  2.5 mg Oral BID Georgette Shell, MD   2.5 mg at 01/31/18 0948  . diltiazem (CARDIZEM CD) 24 hr capsule 360 mg  360 mg Oral Daily Dorothy Spark, MD   360 mg at 01/31/18 0947  . diphenhydrAMINE  (BENADRYL) capsule 25 mg  25 mg Oral QHS PRN Gardiner Barefoot, NP   25 mg at 01/31/18 0108  . metoprolol tartrate (LOPRESSOR) tablet 50 mg  50 mg Oral BID Kathyrn Drown D, NP   50 mg at 01/31/18 0946  . pantoprazole (PROTONIX) EC tablet 40 mg  40 mg Oral Daily Georgette Shell, MD   40 mg at 01/31/18 0946  . rosuvastatin (CRESTOR) tablet 10 mg  10 mg Oral Daily Georgette Shell, MD   10 mg at 01/31/18 9702     Discharge Medications: Please see discharge summary for a list of discharge medications.  Relevant Imaging Results:  Relevant Lab Results:   Additional Information ssn:[id number]. [id number]  Lia Hopping, LCSW

## 2018-02-01 ENCOUNTER — Ambulatory Visit (HOSPITAL_COMMUNITY): Admission: RE | Admit: 2018-02-01 | Payer: Medicare HMO | Source: Ambulatory Visit

## 2018-02-01 LAB — CBC WITH DIFFERENTIAL/PLATELET
BASOS PCT: 0 %
Basophils Absolute: 0 10*3/uL (ref 0.0–0.1)
EOS ABS: 0.1 10*3/uL (ref 0.0–0.7)
EOS PCT: 1 %
HCT: 34 % — ABNORMAL LOW (ref 36.0–46.0)
Hemoglobin: 12.1 g/dL (ref 12.0–15.0)
LYMPHS ABS: 1.2 10*3/uL (ref 0.7–4.0)
Lymphocytes Relative: 15 %
MCH: 31.4 pg (ref 26.0–34.0)
MCHC: 35.6 g/dL (ref 30.0–36.0)
MCV: 88.3 fL (ref 78.0–100.0)
MONO ABS: 0.7 10*3/uL (ref 0.1–1.0)
MONOS PCT: 9 %
NEUTROS PCT: 75 %
Neutro Abs: 6 10*3/uL (ref 1.7–7.7)
PLATELETS: 182 10*3/uL (ref 150–400)
RBC: 3.85 MIL/uL — ABNORMAL LOW (ref 3.87–5.11)
RDW: 12.4 % (ref 11.5–15.5)
WBC: 8 10*3/uL (ref 4.0–10.5)

## 2018-02-01 LAB — COMPREHENSIVE METABOLIC PANEL
ALBUMIN: 3.4 g/dL — AB (ref 3.5–5.0)
ALT: 16 U/L (ref 14–54)
ANION GAP: 6 (ref 5–15)
AST: 22 U/L (ref 15–41)
Alkaline Phosphatase: 54 U/L (ref 38–126)
BUN: 20 mg/dL (ref 6–20)
CALCIUM: 8.1 mg/dL — AB (ref 8.9–10.3)
CO2: 24 mmol/L (ref 22–32)
Chloride: 92 mmol/L — ABNORMAL LOW (ref 101–111)
Creatinine, Ser: 0.8 mg/dL (ref 0.44–1.00)
GFR calc non Af Amer: >60 mL/min (ref >60)
GLUCOSE: 134 mg/dL — AB (ref 65–99)
Potassium: 4.3 mmol/L (ref 3.5–5.1)
SODIUM: 122 mmol/L — AB (ref 135–145)
Total Bilirubin: 0.6 mg/dL (ref 0.3–1.2)
Total Protein: 5.7 g/dL — ABNORMAL LOW (ref 6.5–8.1)

## 2018-02-01 LAB — MAGNESIUM: Magnesium: 2 mg/dL (ref 1.7–2.4)

## 2018-02-01 LAB — PHOSPHORUS: PHOSPHORUS: 2.3 mg/dL — AB (ref 2.5–4.6)

## 2018-02-01 MED ORDER — METOPROLOL TARTRATE 50 MG PO TABS
75.0000 mg | ORAL_TABLET | Freq: Two times a day (BID) | ORAL | Status: DC
  Administered 2018-02-01 – 2018-02-08 (×14): 75 mg via ORAL
  Filled 2018-02-01 (×14): qty 1

## 2018-02-01 MED ORDER — ENSURE ENLIVE PO LIQD
237.0000 mL | ORAL | Status: DC
  Administered 2018-02-01 – 2018-02-07 (×7): 237 mL via ORAL

## 2018-02-01 MED ORDER — SODIUM PHOSPHATES 45 MMOLE/15ML IV SOLN
10.0000 mmol | Freq: Once | INTRAVENOUS | Status: AC
  Administered 2018-02-01: 10 mmol via INTRAVENOUS
  Filled 2018-02-01: qty 3.33

## 2018-02-01 MED ORDER — ADULT MULTIVITAMIN W/MINERALS CH
1.0000 | ORAL_TABLET | Freq: Every day | ORAL | Status: DC
  Administered 2018-02-01 – 2018-02-08 (×8): 1 via ORAL
  Filled 2018-02-01 (×7): qty 1

## 2018-02-01 MED ORDER — TRAMADOL HCL 50 MG PO TABS
50.0000 mg | ORAL_TABLET | Freq: Once | ORAL | Status: AC
  Administered 2018-02-01: 50 mg via ORAL
  Filled 2018-02-01: qty 1

## 2018-02-01 MED ORDER — SODIUM CHLORIDE 0.9 % IV SOLN
INTRAVENOUS | Status: AC
  Administered 2018-02-01: 75 mL/h via INTRAVENOUS
  Administered 2018-02-01 – 2018-02-02 (×2): via INTRAVENOUS

## 2018-02-01 MED ORDER — K PHOS MONO-SOD PHOS DI & MONO 155-852-130 MG PO TABS
500.0000 mg | ORAL_TABLET | Freq: Two times a day (BID) | ORAL | Status: DC
  Filled 2018-02-01: qty 2

## 2018-02-01 NOTE — Progress Notes (Signed)
OT Cancellation Note  Patient Details Name: Donna Ortega MRN: [id number] DOB: [birth year]   Cancelled Treatment:    Reason Eval/Treat Not Completed: Other (comment).  Pt just back to bed. Will return another day  [facility] 02/01/2018, 2:33 PM  Lesle Chris, OTR/L [phone] 02/01/2018

## 2018-02-01 NOTE — Progress Notes (Signed)
PROGRESS NOTE    Donna Ortega  FTD:[id number] DOB: 03/18/1926 DOA: 01/29/2018 PCP: Lanice Shirts, MD   Brief Narrative:  Donna Ortega is a 84 y.o. female with medical history significant of chronic hyponatremia, CAD, three-vessel CABG, GI bleed, diverticulitis, history of A. fib, hypertension and other comorbidites who was admitted with altered mental status.  Patient was brought in by her daughter-in-law who lives with the patient.  According to daughter-in-law patient had altered mental status few days ago to go to urgent care diagnosed with UTI was given antibiotics with results.  She has taken 3 doses of her antibiotic.  Patient had been more and more lethargic over the last few days.  She had to climb 15 steps to get out of the house she was very scared to take her upstairs because of falls. Has been having generalized weakness and was found to have a Na+ of 113. On Admission. Admitted for Metabolic Encephalopathy, Acute on Chronic Hyponatremia, and Atrial Fibrillation. Cardiology consulted and adjusting medications and considering Cardioversion once she recovers from Hyponatremia. Currently receiving IVF Hydration with NS at 75 mL/hr and will continue for now.   Assessment & Plan:   Active Problems:   Atrial fibrillation, chronic (HCC)   Hyponatremia  Metabolic Encephalopathy in the setting of Acute on Chronic Hyponatremia -Appeared dry on Admission, with reported poor oral intake -Sodium on presentation was 113 and improving with gentle IVF Hydration with NS at 75 mL/hr; Sodium is now 122 -Will continue IVF for another 24 hours again and re-evaluate -Urinalysis Unremarkable and Urine Cx Negative  -Per admitting MD discussion with Dr. Hollie Salk, Nephrology will not formally consult at this time but if patient worsens or Sodium drops will discuss with Nephrology further; May discuss with Nephrology again as Sodium did not increase very much from yesterday -Patient remains Intermittently  confused and is only Oriented x1 -If Na+ is corrected and still having confusion will obtain Head CT w/o Contrast vs. MRI -Place on Delirium Precautions  -Unlikely UTI as she was recently treated for it and Urinalysis and Urine Cx Negative  -Likley has some underlying Dementia   Paroxysmal Atrial Fibrillation with slight RVR Hx of Atrial Tachycardia Hc of SSS s/p PPM -On BB and Anticoagulation with Apixaban -Currently in A Fib -Cardiology consulted, may consider cardioversion if sodium improves, meds adjustment per cardiology and have increased Diltaizem to 360 mg po daily and Increased Metoprolol Tartrate to 75mg  po BID today; They are recommending continuing current regimen and now stating possible Cardioversion as an outpatient  -C/w with Telemetry   AKI on CKDII, improving  -Likely from dehydration -She was Recently treated for uti with Omnicef,  -Urinalysis on presentation was unimpressive -BUN/Cr 29/1.01 on presentation and now BUN/Cr is 20/0.80 -Continue Gentle IVF Hydration with NS at 75 mL/hr as above  FTT/Generalized Weakness -Has reported poor oral intake; Consult Nutritionist  -PT/OT evaluated and recommending SNF -Social Worker consulted for assistance with placement   HTN -Continue with Diltiazem 360 mill grams p.o. daily along with Metoprolol Tartrate 75 mg p.o. twice daily  HLD -Continue with Rosuvastatin 10 mg p.o. daily  Hypokalemia, improved -Patient's potassium was 3.4 and improved to 4.3 -Replete with p.o. potassium chloride 40 mEq x1 dose yesterday -Continue to monitor and replete as necessary -Repeat CMP in a.m.  Hypomagnesemia -Patient's Mag Level was 1.6 and improved to 2.0 -Replete with IV Mag Sulfate 2 grams yesterday -Continue to Monitor and Replete as Necessary -Repeat Mag Level in AM  Hypophosphatemia  -His phosphorus level this morning is 2.3 -Replete with p.o. K-Phos Neutral 500 mg p.o. twice daily x2 doses -Continue to monitor and  replete as necessary -Repeat phosphorus level in a.m.  Recent UTI, s/p treatment -Received 3 Doses of Abx as an outpatient -Urinalysis and Urine Cx unremarkable here and not indicative of UTI -Continue to Monitor for Urinary Symptoms    DVT prophylaxis: Anticoagulated with Apxiaban 2.5 mg po BID  Code Status: DO NOT RESUSCITATE Family Communication: Discussed with Son at Bedside Disposition Plan: Anticipate D/C to SNF in next 48 hours if Medically Stable and no other recommendations by Cardiology; Transfer to Telemetry    Consultants:   Cardiology Dr. Meda Coffee   Procedures: None   Antimicrobials: Anti-infectives (From admission, onward)   None     Subjective: Patient was seen and examined at bedside and in her chair eating breakfast however was slightly more confused today than she was yesterday.  Only alert and oriented to herself and knew the president was as well as the year but could not tell me the month or where she was at.  No chest pain, shortness breath, nausea, vomiting.  No other complaints or concerns at this time.  Objective: Vitals:   02/01/18 0328 02/01/18 0400 02/01/18 0500 02/01/18 0600  BP:  (!) 159/72    Pulse: 96 89 88 92  Resp: (!) 21 17 14  (!) 22  Temp:  98.7 F (37.1 C)    TempSrc:  Oral    SpO2: 100% 99% 100% 99%  Weight:      Height:        Intake/Output Summary (Last 24 hours) at 02/01/2018 0741 Last data filed at 02/01/2018 0600 Gross per 24 hour  Intake 1727.5 ml  Output 760 ml  Net 967.5 ml   Filed Weights   01/29/18 1318 01/29/18 1900  Weight: 59 kg (130 lb) 59.6 kg (131 lb 6.3 oz)   Examination: Physical Exam:  Constitutional: Elderly Caucasian female who is currently in no acute distress and appears, comfortable sitting in chair bedside; is more slightly confused today. Eyes: Lids and conjunctive are normal.  Sclera anicteric. ENMT: External ears and nose appear normal.  Grossly normal hearing Neck: Supple with no  JVD Respiratory: Diminished to auscultation bilaterally with no appreciable wheezing, rales, rhonchi.  Patient is not tachypneic or using any accessory muscle breathe Cardiovascular: Irregularly irregular.  No appreciable murmurs, rubs, gallops.  Mild lower extremity edema Abdomen: Soft, nontender, nondistended.  Bowel sounds present all 4 quadrants GU: Deferred Musculoskeletal: No contractures or cyanosis.  No joint deformities noted Skin: Skin is warm and dry with no appreciable rashes or lesions on limited skin evaluation Neurologic: Cranial nerves II through XII grossly intact no appreciable focal deficits. Psychiatric: Impaired judgment insight today.  Awake and alert and oriented x1.  Normal pleasant mood and affect.  Slightly confused  Data Reviewed: I have personally reviewed following labs and imaging studies  CBC: Recent Labs  Lab 01/29/18 1343 02/01/18 0313  WBC 8.9 8.0  NEUTROABS 6.9 6.0  HGB 14.1 12.1  HCT 38.1 34.0*  MCV 85.8 88.3  PLT 248 517   Basic Metabolic Panel: Recent Labs  Lab 01/29/18 1343 01/29/18 1630 01/30/18 0302 01/31/18 0315 02/01/18 0313  NA 113* 116* 117* 121* 122*  K 4.0 3.7 3.8 3.4* 4.3  CL 78* 85* 83* 89* 92*  CO2 22 24 23 24 24   GLUCOSE 128* 125* 102* 105* 134*  BUN 29* 24* 21* 21* 20  CREATININE 1.01* 0.71 <0.30* 0.75 0.80  CALCIUM 8.9 8.2* 8.4* 8.4* 8.1*  MG  --   --   --  1.6* 2.0  PHOS  --   --   --   --  2.3*   GFR: Estimated Creatinine Clearance: 39.4 mL/min (by C-G formula based on SCr of 0.8 mg/dL). Liver Function Tests: Recent Labs  Lab 01/29/18 1343 02/01/18 0313  AST 31 22  ALT 19 16  ALKPHOS 54 54  BILITOT 0.9 0.6  PROT 7.2 5.7*  ALBUMIN 4.3 3.4*   No results for input(s): LIPASE, AMYLASE in the last 168 hours. No results for input(s): AMMONIA in the last 168 hours. Coagulation Profile: Recent Labs  Lab 01/29/18 1343  INR 0.96   Cardiac Enzymes: No results for input(s): CKTOTAL, CKMB, CKMBINDEX, TROPONINI  in the last 168 hours. BNP (last 3 results) No results for input(s): PROBNP in the last 8760 hours. HbA1C: No results for input(s): HGBA1C in the last 72 hours. CBG: Recent Labs  Lab 01/29/18 1323  GLUCAP 147*   Lipid Profile: No results for input(s): CHOL, HDL, LDLCALC, TRIG, CHOLHDL, LDLDIRECT in the last 72 hours. Thyroid Function Tests: Recent Labs    01/31/18 0315  TSH 0.846   Anemia Panel: No results for input(s): VITAMINB12, FOLATE, FERRITIN, TIBC, IRON, RETICCTPCT in the last 72 hours. Sepsis Labs: Recent Labs  Lab 01/29/18 1345  LATICACIDVEN 1.68    Recent Results (from the past 240 hour(s))  Culture, blood (Routine x 2)     Status: None (Preliminary result)   Collection Time: 01/29/18  1:43 PM  Result Value Ref Range Status   Specimen Description BLOOD RIGHT HAND  Final   Special Requests   Final    BOTTLES DRAWN AEROBIC AND ANAEROBIC Blood Culture results may not be optimal due to an inadequate volume of blood received in culture bottles Performed at [facility], [place] [address], [place], [place] [address]    Culture NO GROWTH 2 DAYS  Final   Report Status PENDING  Incomplete  Culture, blood (Routine x 2)     Status: None (Preliminary result)   Collection Time: 01/29/18  1:43 PM  Result Value Ref Range Status   Specimen Description BLOOD LEFT ANTECUBITAL  Final   Special Requests   Final    BOTTLES DRAWN AEROBIC ONLY Blood Culture results may not be optimal due to an inadequate volume of blood received in culture bottles Performed at [facility], [place] [address] [place], [place] [address]    Culture NO GROWTH 2 DAYS  Final   Report Status PENDING  Incomplete  Culture, Urine     Status: None   Collection Time: 01/29/18  6:06 PM  Result Value Ref Range Status   Specimen Description   Final    URINE, CLEAN CATCH Performed at [facility], [place] [address], [place], [place] [address]    Special  Requests   Final    NONE Performed at [facility], [place] [address] [place], [place] [address]    Culture   Final    NO GROWTH Performed at [facility], [place] [address] [place],  [address]    Report Status 01/31/2018 FINAL  Final  MRSA PCR Screening     Status: None   Collection Time: 01/29/18  6:56 PM  Result Value Ref Range Status   MRSA by PCR NEGATIVE NEGATIVE Final    Comment:  The GeneXpert MRSA Assay (FDA approved for NASAL specimens only), is one component of a comprehensive MRSA colonization surveillance program. It is not intended to diagnose MRSA infection nor to guide or monitor treatment for MRSA infections. Performed at [facility], [place] [address] [place], [place] [address]     Radiology Studies: No results found. Scheduled Meds: . apixaban  2.5 mg Oral BID  . diltiazem  360 mg Oral Daily  . metoprolol tartrate  50 mg Oral BID  . pantoprazole  40 mg Oral Daily  . rosuvastatin  10 mg Oral Daily   Continuous Infusions: . sodium chloride 75 mL/hr at 02/01/18 0600    LOS: 3 days   Kerney Elbe, DO [facility] Pager [phone]  If 7PM-7AM, please contact night-coverage [email or web address] Password [facility] 02/01/2018, 7:41 AM

## 2018-02-01 NOTE — Progress Notes (Signed)
Initial Nutrition Assessment  DOCUMENTATION CODES:   Not applicable  INTERVENTION:  - Will order Ensure Enlive once/day, this supplement provides 350 kcal and 20 grams of protein. - Will order daily multivitamin with minerals. - Continue to encourage PO intakes.   NUTRITION DIAGNOSIS:   Inadequate oral intake related to lethargy/confusion as evidenced by per patient/family report.  GOAL:   Patient will meet greater than or equal to 90% of their needs  MONITOR:   PO intake, Supplement acceptance, Weight trends, Labs, I & O's  REASON FOR ASSESSMENT:   Consult Assessment of nutrition requirement/status, Poor PO  ASSESSMENT:   84 y.o. female with medical history significant of chronic hyponatremia, CAD, three-vessel CABG, GI bleed, diverticulitis, history of A. fib, hypertension and other comorbidities. She was brought to the ED for AMS for several days. She was diagnosed with a UTI at Urgent Care and subsequently took 3 doses (across 3 days) of prescribed antibiotics. Patient had been more and more lethargic over the last few days PTA. Has been having generalized weakness and was found to have a Na+ of 113 mmol/L on admission. Admitted for metabolic encephalopathy, acute on chronic hyponatremia, and atrial fibrillation. Cardiology consulted and adjusting medications and considering cardioversion once she recovers from hyponatremia. Currently receiving IVF Hydration with NS at 75 mL/hr.   BMI indicates normal weight. Patient noted to be a/o to self only and no family/visitors present at this time. Per chart review, patient consumed 100% of breakfast this AM (550 kcal, 30 grams of protein). No other intakes documented since admission. Unable to obtain PTA information but notes indicate daughter-in-law, whom pt lives with, reported decreased/poor intakes for the few days PTA. Suspect 2/2 AMS and severe hyponatremia.   metabolic encephalopathy  In the setting of hyponPer Dr. Weston Settle note  yesterday morning: atremia (improving), afib with slight RVR, AKI, FTT and generalized weakness, hypokalemia, hypomagnesemia, recent UTI s/p treatment.    Medications reviewed; 40 mg oral Protonix/day, 10 mmol IV NaPhos x1 today.  Labs reviewed; Na: 122 mmol/L, Cl: 92 mmol/L, Ca: 8.1 mg/dL, Phos: 2.3 mg/dL.   IVF: NS @ 75 mL/hr.     NUTRITION - FOCUSED PHYSICAL EXAM:  Completed; no muscle and no fat wasting noted.   Diet Order:   Diet Order           Diet regular Room service appropriate? Yes; Fluid consistency: Thin  Diet effective now          EDUCATION NEEDS:   No education needs have been identified at this time  Skin:  Skin Assessment: Reviewed RN Assessment  Last BM:  PTA/unknown  Height:   Ht Readings from Last 1 Encounters:  01/29/18 5\' 3"  (1.6 m)    Weight:   Wt Readings from Last 1 Encounters:  01/29/18 131 lb 6.3 oz (59.6 kg)    Ideal Body Weight:  52.27 kg  BMI:  Body mass index is 23.28 kg/m.  Estimated Nutritional Needs:   Kcal:  1370-1610 (23-27 kcal/kg)  Protein:  60-70 grams (1-1.2 grams/kg)  Fluid:  >/= 1.8 L/day     Jarome Matin, MS, RD, LDN, [facility] Inpatient Clinical Dietitian Pager # [phone] After hours/weekend pager # [phone]

## 2018-02-01 NOTE — Progress Notes (Signed)
[facility] REPORT CALLED TO 4 WEST ROOM, [phone] WIITH ALLOWANCE FOR ALL QUESTIONS TO BE ASKED AND ANSWERED. PATIENT EATING DINNER - WILL TRANSPORT WHEN THE PATIENT IS FINISHED EATING.

## 2018-02-01 NOTE — Progress Notes (Addendum)
Progress Note  Patient Name: Donna Ortega Date of Encounter: 02/01/2018  Primary Cardiologist: Dr. Grayland Jack, MD  Subjective   Pt up to chair today eating breakfast. HR appears to be regular, junctional? Obtaining 12 lead for confirmation   Inpatient Medications    Scheduled Meds: . apixaban  2.5 mg Oral BID  . diltiazem  360 mg Oral Daily  . metoprolol tartrate  50 mg Oral BID  . pantoprazole  40 mg Oral Daily  . rosuvastatin  10 mg Oral Daily   Continuous Infusions: . sodium chloride 75 mL/hr at 02/01/18 0600   PRN Meds: acetaminophen, diphenhydrAMINE   Vital Signs    Vitals:   02/01/18 0328 02/01/18 0400 02/01/18 0500 02/01/18 0600  BP:  (!) 159/72    Pulse: 96 89 88 92  Resp: (!) 21 17 14  (!) 22  Temp:  98.7 F (37.1 C)    TempSrc:  Oral    SpO2: 100% 99% 100% 99%  Weight:      Height:        Intake/Output Summary (Last 24 hours) at 02/01/2018 0734 Last data filed at 02/01/2018 0600 Gross per 24 hour  Intake 1727.5 ml  Output 760 ml  Net 967.5 ml   Filed Weights   01/29/18 1318 01/29/18 1900  Weight: 130 lb (59 kg) 131 lb 6.3 oz (59.6 kg)   Physical Exam   General: Frail,elderly, NAD Skin: Warm, dry, intact  Head: Normocephalic, atraumatic, clear, moist mucus membranes. Neck: Negative for carotid bruits. No JVD Lungs:Clear to ausculation bilaterally. No wheezes, rales, or rhonchi. Breathing is unlabored. Cardiovascular: Regular with S1 S2. No murmurs, rubs or gallops Abdomen: Soft, non-tender, non-distended with normoactive bowel sounds. No obvious abdominal masses. MSK: Strength and tone appear normal for age. 5/5 in all extremities Extremities: No edema. No clubbing or cyanosis. DP/PT pulses 2+ bilaterally Neuro: Alert and oriented. No focal deficits. No facial asymmetry. MAE spontaneously. Psych: Responds to questions appropriately with normal affect.    Labs    Chemistry Recent Labs  Lab 01/29/18 1343  01/30/18 0302  01/31/18 0315 02/01/18 0313  NA 113*   < > 117* 121* 122*  K 4.0   < > 3.8 3.4* 4.3  CL 78*   < > 83* 89* 92*  CO2 22   < > 23 24 24   GLUCOSE 128*   < > 102* 105* 134*  BUN 29*   < > 21* 21* 20  CREATININE 1.01*   < > <0.30* 0.75 0.80  CALCIUM 8.9   < > 8.4* 8.4* 8.1*  PROT 7.2  --   --   --  5.7*  ALBUMIN 4.3  --   --   --  3.4*  AST 31  --   --   --  22  ALT 19  --   --   --  16  ALKPHOS 54  --   --   --  54  BILITOT 0.9  --   --   --  0.6  GFRNONAA 48*   < > NOT CALCULATED >60 >60  GFRAA 55*   < > NOT CALCULATED >60 >60  ANIONGAP 13   < > 11 8 6    < > = values in this interval not displayed.    Hematology Recent Labs  Lab 01/29/18 1343 02/01/18 0313  WBC 8.9 8.0  RBC 4.44 3.85*  HGB 14.1 12.1  HCT 38.1 34.0*  MCV 85.8 88.3  MCH  31.8 31.4  MCHC 37.0* 35.6  RDW 12.1 12.4  PLT 248 182    Cardiac EnzymesNo results for input(s): TROPONINI in the last 168 hours.  Recent Labs  Lab 01/29/18 1351  TROPIPOC 0.00    BNPNo results for input(s): BNP, PROBNP in the last 168 hours.   DDimer No results for input(s): DDIMER in the last 168 hours.   Radiology    No results found.  Telemetry    02/01/18 NSR? Junctional? WIll order 12-lead to confirm - Personally Reviewed  ECG    No new tracings as of 02/01/18- Personally Reviewed  Cardiac Studies   ECHO:02/28/2014 Study Conclusions  - Left ventricle: The cavity size was normal. Wall thickness was increased in a pattern of mild LVH. Systolic function was normal. The estimated ejection fraction was in the range of 55% to 60%. - Mitral valve: There was mild regurgitation. - Left atrium: The atrium was severely dilated. - Pulmonary arteries: PA peak pressure: 39 mm Hg (S).  CATH:None  Patient Profile     84 y.o. female with a hx of CADs/pCABG(2007), paroxysmal atrial fibrillation/atrial tachycardia, history of tachybradycardia syndromes/pPPM,GI bleed, diverticulitis, HTN,HLD and chronic  hyponatremiawho is being seen today for the evaluation of atrial fibrillationat the request of Dr.Xu.  Known history of amiodarone allergy. On Eliquis despite history of GI bleed 2012per primary cardiologist.  [place]    1.Paroxysmal atrial fibrillation: -Patient has a known history of PAF. She was last seen in the office on 01/22/2018 for symptomatic atrial fibrillation. At that time, patient reported a 2-day history of intermittent shortness of breath and fatigue with no evidence of palpitations. On the morning of the office visit, granddaughter noted an irregularHR of 113 and a BP of 140/100. The patient took an extra 25 mg of metoprolol as advised by Dr. Acie Fredrickson with symptom resolution and conversion to NSR. EKG at the time was performed which confirmed sinus rhythm. Recommendations were made to continue BB and CCB at the current dose with extra metoprolol as needed if symptomatic Afib recurred. -Presented during this admission for severe hyponatremia likely the cause of herintermittentatrial fibrillation. -Heart rate controlled, appears to be in fast junctional with rates in the 80's-90's>>>will order 12-lead to confirm  -Continue Eliquis 2.5 mg twice daily -Diltiazem CD increased to 360 mg daily with metoprolol tartrate increased to 50 mg twice daily>>BP today  120/81>137/68>139/67 -CHA2DS2VASc =5 (age+2, sex, HTN, vascular disease)  2. Hyponatremia with history of chronic hyponatremia: -Patient presented with complaints of generalized weakness and lethargy -Alert and oriented today -Na+ 122 today,up from 113 on admission -NaCl continues to infuse with improvement  3. Hypertension: -Stabilizing, Elevated, 120/81>137/68>139/67 -Diltiazem CD 360 mg, metoprolol increased to 50 mg twice daily  4. HLD: -Stable, continue Crestor  5. Hypokalemia: -K+ 4.3 today -Continue daily monitoring   Signed, Kathyrn Drown NP-C [facility] Pager:  [phone] 02/01/2018, 7:34 AM    For questions or updates, please contact   Please consult [email or web address] for contact info under Cardiology/STEMI.  The patient was seen, examined and discussed with Kathyrn Drown, NP  and I agree with the above.   84 y.o.femalewith a hx of CADs/pCABG(2007), paroxysmal atrial fibrillation/atrial tachycardia, history of tachybradycardia syndromes/pPPM,GI bleed, diverticulitis, HTN,HLD and chronic hyponatremiawho is being seen today for the evaluation of atrial fibrillationat the request of Dr.Xu.Known history of amiodarone allergy. On Eliquis despite history of GI bleed 2012per primary cardiologist.No falls.  Patient of Dr Acie Fredrickson, last seen on 6/10 when found in a-fib with RVR,  admittedon 01/29/2018 with altered mental status for several days.Diagnosed with UTI,decreased appetite andPOintake during thattime. Found to be in hyponatremia of 110, cardiologyhas been consulted given her history of paroxysmal atrial fibrillation.  She is currently still receiving NaCl infusion, Na is improving, now 122, she is more oriented and feels better. BP and HR has improved.  I would continue cardizem CD 360 mg po daily, increase metoprolol to 75 mg PO BID, continue replace fluids and NaCl infusion, weight is stable, no signs of CHF, continue anticoagulation with Eliquis 2.5 mg po BID, we wil consider cardioversion once she recovers from UTI and hyponatremia, can be done as outpatient.  Ena Dawley, MD 02/01/2018

## 2018-02-01 NOTE — Progress Notes (Signed)
PT Cancellation Note  Patient Details Name: Donna Ortega MRN: [id number] DOB: 06/20/1926   Cancelled Treatment:    Reason Eval/Treat Not Completed: Fatigue/lethargy limiting ability to participate, just ambulated to BR and back into bed.    Claretha Cooper 02/01/2018, 3:05 PM Tresa Endo PT [phone]

## 2018-02-02 DIAGNOSIS — F0390 Unspecified dementia without behavioral disturbance: Secondary | ICD-10-CM

## 2018-02-02 LAB — COMPREHENSIVE METABOLIC PANEL
ALBUMIN: 3.3 g/dL — AB (ref 3.5–5.0)
ALK PHOS: 51 U/L (ref 38–126)
ALT: 17 U/L (ref 14–54)
ANION GAP: 6 (ref 5–15)
AST: 18 U/L (ref 15–41)
BILIRUBIN TOTAL: 0.4 mg/dL (ref 0.3–1.2)
BUN: 21 mg/dL — AB (ref 6–20)
CALCIUM: 8.2 mg/dL — AB (ref 8.9–10.3)
CO2: 24 mmol/L (ref 22–32)
Chloride: 92 mmol/L — ABNORMAL LOW (ref 101–111)
Creatinine, Ser: 0.74 mg/dL (ref 0.44–1.00)
GFR calc Af Amer: >60 mL/min (ref >60)
GFR calc non Af Amer: >60 mL/min (ref >60)
GLUCOSE: 112 mg/dL — AB (ref 65–99)
Potassium: 4 mmol/L (ref 3.5–5.1)
SODIUM: 122 mmol/L — AB (ref 135–145)
TOTAL PROTEIN: 5.5 g/dL — AB (ref 6.5–8.1)

## 2018-02-02 LAB — CBC WITH DIFFERENTIAL/PLATELET
BASOS ABS: 0 10*3/uL (ref 0.0–0.1)
BASOS PCT: 0 %
EOS ABS: 0.1 10*3/uL (ref 0.0–0.7)
Eosinophils Relative: 1 %
HEMATOCRIT: 32.4 % — AB (ref 36.0–46.0)
HEMOGLOBIN: 11.6 g/dL — AB (ref 12.0–15.0)
Lymphocytes Relative: 14 %
Lymphs Abs: 1.1 10*3/uL (ref 0.7–4.0)
MCH: 31.6 pg (ref 26.0–34.0)
MCHC: 35.8 g/dL (ref 30.0–36.0)
MCV: 88.3 fL (ref 78.0–100.0)
Monocytes Absolute: 0.8 10*3/uL (ref 0.1–1.0)
Monocytes Relative: 10 %
NEUTROS ABS: 6.3 10*3/uL (ref 1.7–7.7)
NEUTROS PCT: 75 %
Platelets: 184 10*3/uL (ref 150–400)
RBC: 3.67 MIL/uL — ABNORMAL LOW (ref 3.87–5.11)
RDW: 12.4 % (ref 11.5–15.5)
WBC: 8.4 10*3/uL (ref 4.0–10.5)

## 2018-02-02 LAB — OSMOLALITY, URINE: OSMOLALITY UR: 658 mosm/kg (ref 300–900)

## 2018-02-02 LAB — PHOSPHORUS: Phosphorus: 3 mg/dL (ref 2.5–4.6)

## 2018-02-02 LAB — SODIUM, URINE, RANDOM: SODIUM UR: 93 mmol/L

## 2018-02-02 LAB — MAGNESIUM: Magnesium: 1.7 mg/dL (ref 1.7–2.4)

## 2018-02-02 MED ORDER — SODIUM CHLORIDE 1 G PO TABS
2.0000 g | ORAL_TABLET | Freq: Three times a day (TID) | ORAL | Status: DC
  Administered 2018-02-02 – 2018-02-07 (×16): 2 g via ORAL
  Filled 2018-02-02 (×15): qty 2

## 2018-02-02 MED ORDER — FUROSEMIDE 10 MG/ML IJ SOLN
40.0000 mg | Freq: Once | INTRAMUSCULAR | Status: AC
  Administered 2018-02-02: 40 mg via INTRAVENOUS
  Filled 2018-02-02: qty 4

## 2018-02-02 MED ORDER — SODIUM CHLORIDE 0.9 % IV SOLN
INTRAVENOUS | Status: AC
Start: 2018-02-02 — End: 2018-02-03
  Administered 2018-02-02 (×2): via INTRAVENOUS

## 2018-02-02 NOTE — Progress Notes (Signed)
PROGRESS NOTE    ASA FATH  CBJ:[id number] DOB: [birth year] DOA: 01/29/2018 PCP: Lanice Shirts, MD   Brief Narrative:  Donna Ortega is a 84 y.o. female with medical history significant of chronic hyponatremia, CAD, three-vessel CABG, GI bleed, diverticulitis, history of A. fib, hypertension and other comorbidites who was admitted with altered mental status.  Patient was brought in by her daughter-in-law who lives with the patient.  According to daughter-in-law patient had altered mental status few days ago to go to urgent care diagnosed with UTI was given antibiotics with results.  She has taken 3 doses of her antibiotic.  Patient had been more and more lethargic over the last few days.  She had to climb 15 steps to get out of the house she was very scared to take her upstairs because of falls. Has been having generalized weakness and was found to have a Na+ of 113. On Admission. Admitted for Metabolic Encephalopathy, Acute on Chronic Hyponatremia, and Atrial Fibrillation. Cardiology consulted and adjusting medications and considering Cardioversion once she recovers from Hyponatremia. Currently receiving IVF Hydration with NS at 75 mL/hr and will continue for now and because Na+ not significantly improving will consult Nephrology for further evaluation and recommendations and they are recommending Fluid Restriction, Salt tablets 2 grams TID, and rechecking Urine Studies. Per my discussion with Dr. Justin Mend this AM he recommended continuing IVF for now and giving 40 mg of IV Lasix.   Assessment & Plan:   Active Problems:   Atrial fibrillation, chronic (HCC)   Hyponatremia  Metabolic Encephalopathy in the setting of Acute on Chronic Hyponatremia and likely underlying chronic undiagnosed Dementia -Appeared dry on Admission, with reported poor oral intake -Sodium on presentation was 113 and improving with gentle IVF Hydration with NS at 75 mL/hr; Sodium is still 122 -Will continue IVF for another  24 hours again and re-evaluate at the recommendations of Nephrology; Nephrology also recommended given a dose of IV Lasix 40 mg x1 today  -Urine Studies are being resent  -Urinalysis Unremarkable and Urine Cx Negative  -Patient remains Intermittently confused and disoriented and likely from Underlying Dementia  -If Na+ is corrected and still having confusion will obtain Head CT w/o Contrast vs. MRI -Placed on Delirium Precautions; Avoid Sundowning  -Unlikely UTI as she was recently treated for it and Urinalysis and Urine Cx Negative  -Likley has some underlying Dementia  -Nephrology starting Fluid Restriction Diet (1500 mL) and Salt Tablets 2grams po TID; Dr. Justin Mend does not feel as if case is consistent with SIADH   Paroxysmal Atrial Fibrillation with slight RVR Hx of Atrial Tachycardia Hc of SSS s/p PPM -On BB and Anticoagulation with Apixaban -Currently in Rate controlled A Fib on Telemetry  -Cardiology consulted, may consider cardioversion if sodium improves, meds adjustment per cardiology and have increased Diltaizem to 360 mg po daily and Increased Metoprolol Tartrate to 75mg  po BID yesterday; They are recommending continuing current regimen and now stating possible Cardioversion as an outpatient  -C/w with Telemetry   AKI on CKDII, improving  -Likely from dehydration -She was Recently treated for uti with Omnicef,  -Urinalysis on presentation was unimpressive -BUN/Cr 29/1.01 on presentation and now BUN/Cr is 21/0.74 -Continue Gentle IVF Hydration with NS at 75 mL/hr as above  FTT/Generalized Weakness -Has reported poor oral intake; Consult Nutritionist  -PT/OT evaluated and recommending SNF still and Recommending Rolling Walker with 5" Wheels  -Education officer, museum consulted for assistance with placement   HTN -Continue with Diltiazem 360  mill grams p.o. daily along with Metoprolol Tartrate 75 mg p.o. twice daily  HLD -Continue with Rosuvastatin 10 mg p.o. daily  Hypokalemia,  improved -Patient's potassium was 3.4 and improved to 4.0 -Continue to monitor and replete as necessary -Repeat CMP in a.m.  Hypomagnesemia -Patient's Mag Level was 1.6 and improved to 1.7 -Continue to Monitor and Replete as Necessary -Repeat Mag Level in AM   Hypophosphatemia  -His phosphorus level this morning is 3.0 -Continue to monitor and replete as necessary -Repeat phosphorus level in a.m.  Recent UTI, s/p treatment -Received 3 Doses of Abx as an outpatient -Urinalysis and Urine Cx unremarkable here and not indicative of UTI -Continue to Monitor for Urinary Symptoms    DVT prophylaxis: Anticoagulated with Apxiaban 2.5 mg po BID  Code Status: DO NOT RESUSCITATE Family Communication: Discussed with Son at Bedside Disposition Plan: Anticipate D/C to SNF in next 48 hours if Medically Stable and no other recommendations by Cardiology or Nephrology; Transferred to Telemetry a few days ago   Consultants:   Cardiology Dr. Meda Coffee  Nephrology Dr. Edrick Oh   Procedures: None   Antimicrobials: Anti-infectives (From admission, onward)   None     Subjective: Patient was seen and examined at bedside and still somewhat confused because she did not know where she was but knew the month, the year, and the date and knew the President and her name. States she was "disoriented" and not doing well. No CP or SOB. No Nausea or Vomiting. No other concerns or complaints at this time.  Objective: Vitals:   02/01/18 1838 02/01/18 2039 02/02/18 0514 02/02/18 0944  BP: 120/71 136/71 (!) 141/78 138/64  Pulse: 90 92 85 85  Resp: 14 16 14    Temp: 98.7 F (37.1 C) 97.7 F (36.5 C) 98.3 F (36.8 C)   TempSrc: Oral Oral Oral   SpO2: 100% 99% 98%   Weight:      Height:        Intake/Output Summary (Last 24 hours) at 02/02/2018 1405 Last data filed at 02/02/2018 0944 Gross per 24 hour  Intake 2785.6 ml  Output 230 ml  Net 2555.6 ml   Filed Weights   01/29/18 1318 01/29/18 1900    Weight: 59 kg (130 lb) 59.6 kg (131 lb 6.3 oz)   Examination: Physical Exam:  Constitutional: Thin elderly Caucasian female who is currently in no acute distress however appears disoriented and still slightly confused Eyes: Sclera anicteric.  Lids normal. ENMT: External ears and nose appear normal.  Grossly normal hearing Neck: Supple with no JVD Respiratory: To auscultation bilaterally with no appreciable wheezing, rhonchi, or rales.  Not wearing any supplemental oxygen via nasal cannula and has unlabored breathing Cardiovascular: Irregularly irregular but rate controlled.  No appreciable murmurs, rubs, gallops. Abdomen: Soft, nontender, nondistended.  Bowel sounds present all 4 quadrants GU: Deferred Musculoskeletal: No contractures cyanosis.  No joint deformities noted Skin: Skin is warm and dry with no appreciable rashes or lesions on limited skin evaluation Neurologic: Cranial nerves II through XII grossly intact no appreciable focal deficits Psychiatric: She is awake and alert but does not know if she is not slightly disoriented.  Knows the month the date and the year along with her full name and President of the [place].  Normal mood and affect  Data Reviewed: I have personally reviewed following labs and imaging studies  CBC: Recent Labs  Lab 01/29/18 1343 02/01/18 0313 02/02/18 0418  WBC 8.9 8.0 8.4  NEUTROABS 6.9 6.0  6.3  HGB 14.1 12.1 11.6*  HCT 38.1 34.0* 32.4*  MCV 85.8 88.3 88.3  PLT 248 182 268   Basic Metabolic Panel: Recent Labs  Lab 01/29/18 1630 01/30/18 0302 01/31/18 0315 02/01/18 0313 02/02/18 0418  NA 116* 117* 121* 122* 122*  K 3.7 3.8 3.4* 4.3 4.0  CL 85* 83* 89* 92* 92*  CO2 24 23 24 24 24   GLUCOSE 125* 102* 105* 134* 112*  BUN 24* 21* 21* 20 21*  CREATININE 0.71 <0.30* 0.75 0.80 0.74  CALCIUM 8.2* 8.4* 8.4* 8.1* 8.2*  MG  --   --  1.6* 2.0 1.7  PHOS  --   --   --  2.3* 3.0   GFR: Estimated Creatinine Clearance: 39.4 mL/min (by C-G  formula based on SCr of 0.74 mg/dL). Liver Function Tests: Recent Labs  Lab 01/29/18 1343 02/01/18 0313 02/02/18 0418  AST 31 22 18   ALT 19 16 17   ALKPHOS 54 54 51  BILITOT 0.9 0.6 0.4  PROT 7.2 5.7* 5.5*  ALBUMIN 4.3 3.4* 3.3*   No results for input(s): LIPASE, AMYLASE in the last 168 hours. No results for input(s): AMMONIA in the last 168 hours. Coagulation Profile: Recent Labs  Lab 01/29/18 1343  INR 0.96   Cardiac Enzymes: No results for input(s): CKTOTAL, CKMB, CKMBINDEX, TROPONINI in the last 168 hours. BNP (last 3 results) No results for input(s): PROBNP in the last 8760 hours. HbA1C: No results for input(s): HGBA1C in the last 72 hours. CBG: Recent Labs  Lab 01/29/18 1323  GLUCAP 147*   Lipid Profile: No results for input(s): CHOL, HDL, LDLCALC, TRIG, CHOLHDL, LDLDIRECT in the last 72 hours. Thyroid Function Tests: Recent Labs    01/31/18 0315  TSH 0.846   Anemia Panel: No results for input(s): VITAMINB12, FOLATE, FERRITIN, TIBC, IRON, RETICCTPCT in the last 72 hours. Sepsis Labs: Recent Labs  Lab 01/29/18 1345  LATICACIDVEN 1.68    Recent Results (from the past 240 hour(s))  Culture, blood (Routine x 2)     Status: None (Preliminary result)   Collection Time: 01/29/18  1:43 PM  Result Value Ref Range Status   Specimen Description   Final    BLOOD RIGHT HAND Performed at [facility] [address], [place], [place] [address]    Special Requests   Final    BOTTLES DRAWN AEROBIC AND ANAEROBIC Blood Culture results may not be optimal due to an inadequate volume of blood received in culture bottles Performed at [facility] [address] [place], [place] [address]    Culture   Final    NO GROWTH 4 DAYS Performed at [facility], [place] [address] [place], [place] [address]    Report Status PENDING  Incomplete  Culture, blood (Routine x 2)     Status: None (Preliminary result)   Collection Time:  01/29/18  1:43 PM  Result Value Ref Range Status   Specimen Description   Final    BLOOD LEFT ANTECUBITAL Performed at [facility] [address], [place], [place] [address]    Special Requests   Final    BOTTLES DRAWN AEROBIC ONLY Blood Culture results may not be optimal due to an inadequate volume of blood received in culture bottles Performed at [facility] [address], [place], [place] [address]    Culture   Final    NO GROWTH 4 DAYS Performed at [facility], [place] [address]  [place], [place] [address]    Report Status PENDING  Incomplete  Culture, Urine     Status: None   Collection Time: 01/29/18  6:06 PM  Result Value Ref Range Status   Specimen Description   Final    URINE, CLEAN CATCH Performed at [facility], [place] [address], [place], [place] [address]    Special Requests   Final    NONE Performed at [facility], [place] [address], [place], [place] [address]    Culture   Final    NO GROWTH Performed at [facility], [place] [address] [place], [place] [address]    Report Status 01/31/2018 FINAL  Final  MRSA PCR Screening     Status: None   Collection Time: 01/29/18  6:56 PM  Result Value Ref Range Status   MRSA by PCR NEGATIVE NEGATIVE Final    Comment:        The GeneXpert MRSA Assay (FDA approved for NASAL specimens only), is one component of a comprehensive MRSA colonization surveillance program. It is not intended to diagnose MRSA infection nor to guide or monitor treatment for MRSA infections. Performed at [facility], [place] [address] [place],  [address]     Radiology Studies: No results found. Scheduled Meds: . apixaban  2.5 mg Oral BID  . diltiazem  360 mg Oral Daily  . feeding supplement (ENSURE ENLIVE)  237 mL Oral Q24H  . furosemide  40 mg Intravenous Once  . metoprolol tartrate  75 mg Oral BID  . multivitamin with minerals  1  tablet Oral Daily  . pantoprazole  40 mg Oral Daily  . rosuvastatin  10 mg Oral Daily  . sodium chloride  2 g Oral TID WC   Continuous Infusions: . sodium chloride 75 mL/hr at 02/02/18 0806    LOS: 4 days   Kerney Elbe, DO [facility] Pager [phone]  If 7PM-7AM, please contact night-coverage [email or web address] Password [facility] 02/02/2018, 2:05 PM

## 2018-02-02 NOTE — Progress Notes (Signed)
Progress Note  Patient Name: Donna Ortega Date of Encounter: 02/02/2018  Primary Cardiologist: Dr. Grayland Jack, MD   Subjective   She is more oriented.  Inpatient Medications    Scheduled Meds: . apixaban  2.5 mg Oral BID  . diltiazem  360 mg Oral Daily  . feeding supplement (ENSURE ENLIVE)  237 mL Oral Q24H  . metoprolol tartrate  75 mg Oral BID  . multivitamin with minerals  1 tablet Oral Daily  . pantoprazole  40 mg Oral Daily  . rosuvastatin  10 mg Oral Daily   Continuous Infusions: . sodium chloride 75 mL/hr at 02/01/18 1838   PRN Meds: acetaminophen, diphenhydrAMINE   Vital Signs    Vitals:   02/01/18 1700 02/01/18 1838 02/01/18 2039 02/02/18 0514  BP: 97/62 120/71 136/71 (!) 141/78  Pulse:  90 92 85  Resp:  14 16 14   Temp:  98.7 F (37.1 C) 97.7 F (36.5 C) 98.3 F (36.8 C)  TempSrc:  Oral Oral Oral  SpO2:  100% 99% 98%  Weight:      Height:        Intake/Output Summary (Last 24 hours) at 02/02/2018 0618 Last data filed at 02/01/2018 2047 Gross per 24 hour  Intake 2314.35 ml  Output 630 ml  Net 1684.35 ml   Filed Weights   01/29/18 1318 01/29/18 1900  Weight: 130 lb (59 kg) 131 lb 6.3 oz (59.6 kg)   Physical Exam   General: Elderly, frail, NAD Skin: Warm, dry, intact  Head: Normocephalic, atraumatic, clear, moist mucus membranes. Neck: Negative for carotid bruits. No JVD Lungs:Clear to ausculation bilaterally. No wheezes, rales, or rhonchi. Breathing is unlabored. Cardiovascular: Irregularly irregular with S1 S2. No murmurs, rubs or gallops Abdomen: Soft, non-tender, non-distended with normoactive bowel sounds. No obvious abdominal masses. MSK: Strength and tone appear normal for age. 5/5 in all extremities Extremities: No edema. No clubbing or cyanosis. DP/PT pulses 2+ bilaterally Neuro: Alert and oriented. No focal deficits. No facial asymmetry. MAE spontaneously. Psych: Responds to questions appropriately with normal affect.     Labs    Chemistry Recent Labs  Lab 01/29/18 1343  01/31/18 0315 02/01/18 0313 02/02/18 0418  NA 113*   < > 121* 122* 122*  K 4.0   < > 3.4* 4.3 4.0  CL 78*   < > 89* 92* 92*  CO2 22   < > 24 24 24   GLUCOSE 128*   < > 105* 134* 112*  BUN 29*   < > 21* 20 21*  CREATININE 1.01*   < > 0.75 0.80 0.74  CALCIUM 8.9   < > 8.4* 8.1* 8.2*  PROT 7.2  --   --  5.7* 5.5*  ALBUMIN 4.3  --   --  3.4* 3.3*  AST 31  --   --  22 18  ALT 19  --   --  16 17  ALKPHOS 54  --   --  54 51  BILITOT 0.9  --   --  0.6 0.4  GFRNONAA 48*   < > >60 >60 >60  GFRAA 55*   < > >60 >60 >60  ANIONGAP 13   < > 8 6 6    < > = values in this interval not displayed.    Hematology Recent Labs  Lab 01/29/18 1343 02/01/18 0313 02/02/18 0418  WBC 8.9 8.0 8.4  RBC 4.44 3.85* 3.67*  HGB 14.1 12.1 11.6*  HCT 38.1 34.0* 32.4*  MCV 85.8 88.3 88.3  MCH 31.8 31.4 31.6  MCHC 37.0* 35.6 35.8  RDW 12.1 12.4 12.4  PLT 248 182 184   Cardiac EnzymesNo results for input(s): TROPONINI in the last 168 hours.  Recent Labs  Lab 01/29/18 1351  TROPIPOC 0.00    BNPNo results for input(s): BNP, PROBNP in the last 168 hours.   DDimer No results for input(s): DDIMER in the last 168 hours.   Radiology    No results found. Telemetry    A-fib, rate controlled 80-95 BPM - Personally Reviewed  ECG    No new tracings as of 01/31/18 - Personally Reviewed  Cardiac Studies   ECHO: 02/28/2014 Study Conclusions  - Left ventricle: The cavity size was normal. Wall thickness was increased in a pattern of mild LVH. Systolic function was normal. The estimated ejection fraction was in the range of 55% to 60%. - Mitral valve: There was mild regurgitation. - Left atrium: The atrium was severely dilated. - Pulmonary arteries: PA peak pressure: 39 mm Hg (S).  CATH: None  Patient Profile     84 y.o. female with a hx of CAD s/p CABG (2007), paroxysmal atrial fibrillation/atrial tachycardia, history of tachybradycardia  syndrome s/p PPM, GI bleed, diverticulitis, HTN, HLD and chronic hyponatremia who is being seen today for the evaluation of atrial fibrillation at the request of Dr. Erlinda Hong.   Known history of amiodarone allergy. On Eliquis despite history of GI bleed 2012 per primary cardiologist.  Assessment & Plan    1.  Paroxysmal atrial fibrillation: -Patient has a known history of PAF. She was last seen in the office on 01/22/2018 for symptomatic atrial fibrillation.  At that time, patient reported a 2-day history of intermittent shortness of breath and fatigue with no evidence of palpitations. On the morning of the office visit, granddaughter noted an irregular HR of 113 and a BP of 140/100. The patient took an extra 25 mg of metoprolol as advised by Dr. Acie Fredrickson with symptom resolution and conversion to NSR.  EKG at the time was performed which confirmed sinus rhythm. Recommendations were made to continue BB and CCB at the current dose with extra metoprolol as needed if symptomatic Afib recurred. -Presented during this admission for severe hyponatremia likely the cause of her intermittent atrial fibrillation. -Heart rate controlled, 90's-100's with intermittent periods of NSR/PAC's/AF -Continue Eliquis 2.5 mg twice daily -Continue diltiazem CD 240 mg daily, metoprolol tartrate increased yesterday to 37.5 mg twice daily>>will consider increasing to 50 mg twice daily given her persistent hypertension  -CHA2DS2VASc =5 (age+2, sex, HTN, vascular disease)  2.  Hyponatremia with history of chronic hyponatremia: -Patient presented with complaints of generalized weakness and lethargy -Alert and oriented today  -Na+ 121 today, up from 113 on admission -NS infusing today  3.  Hypertension: -Elevated, 184/90>175/76>159/81 -Diltiazem CD 240 mg, will increase metoprolol to 50mg  twice daily and monitor   4.  HLD: -Stable, continue Crestor  5. Hypokalemia: -K+ 3.4 today. Will replete with KDUR 44meq x one today    -Continue daily monitoring   She is currently still receiving NaCl infusion, no signs of fluid overload, Na is improving, now 122, she is more oriented and feels better. BP is better controlled, HR now controlled in 80-90 BPM, I wouldcontinue cardizem CD 360 mg po daily and metoprolol to 50 mg PO BID, continue anticoagulation with Eliquis 2.5 mg po BID, we wil consider cardioversion once she recovers from UTI and hyponatremia, possibly as outpatient.   Ena Dawley,  MD 02/02/2018

## 2018-02-02 NOTE — Progress Notes (Signed)
OT Cancellation Note  Patient Details Name: Donna Ortega MRN: [id number] DOB: Feb 15, 1926   Cancelled Treatment:    Reason Eval/Treat Not Completed: Other (comment). Pt is sleeping soundly; she was up with PT earlier. Will return next week.  Vernon Ariel 02/02/2018, 2:44 PM  Lesle Chris, OTR/L [phone] 02/02/2018

## 2018-02-02 NOTE — Care Management Important Message (Signed)
Important Message  Patient Details  Name: Donna Ortega MRN: [id number] Date of Birth: [birth year]   Medicare Important Message Given:  Yes    Kerin Salen 02/02/2018, 11:23 AMImportant Message  Patient Details  Name: Donna Ortega MRN: [id number] Date of Birth: 1926-01-27   Medicare Important Message Given:  Yes    Kerin Salen 02/02/2018, 11:22 AM

## 2018-02-02 NOTE — Progress Notes (Signed)
Physical Therapy Treatment Patient Details Name: Donna Ortega MRN: [id number] DOB: [birth year] Today's Date: 02/02/2018    History of Present Illness ASHEY TRAMONTANA is a 84 y.o. female with medical history significant of chronic hyponatremia, CAD, three-vessel CABG, GI bleed, diverticulitis, history of A. fib, hypertension admitted with altered mental status on 01/29/18, found to have sodium 113, recent  urgent care visit forUTI.    PT Comments    Patient progressing slowly due to eating lunch and prefers to get up to eat so PT assisted pt OOB to chair.  Reported no c/o dizziness/lighe headedness this session.  She continues to have some decreased cognition and remains appropriate for skilled rehab as she lived alone previously.  PT to follow acutely.   Follow Up Recommendations  SNF     Equipment Recommendations  Rolling walker with 5" wheels    Recommendations for Other Services       Precautions / Restrictions Precautions Precautions: Fall Precaution Comments: monitor BP    Mobility  Bed Mobility Overal bed mobility: Needs Assistance Bed Mobility: Supine to Sit     Supine to sit: Supervision;HOB elevated     General bed mobility comments: increased time  Transfers Overall transfer level: Needs assistance Equipment used: Rolling walker (2 wheeled) Transfers: Sit to/from [facility] Sit to Stand: Min assist Stand pivot transfers: Min assist       General transfer comment: used RW to stand and take steps to [facility], no c/o dizziness, but limited to chair due to pt eating lunch and wanted to get up to finish due to difficulty eating in the bed  Ambulation/Gait                 Stairs             Wheelchair Mobility    Modified Rankin (Stroke Patients Only)       Balance Overall balance assessment: Mild deficits observed, not formally tested                                          Cognition Arousal/Alertness:  Awake/alert Behavior During Therapy: Anxious Overall Cognitive Status: No family/caregiver present to determine baseline cognitive functioning Area of Impairment: Orientation                 Orientation Level: Time;Situation             General Comments: voiced feeling more confused today "I don't know what's going on, just have been poked a lot, but no longer having diarrhea".      Exercises      General Comments        Pertinent Vitals/Pain Pain Assessment: No/denies pain    Home Living                      Prior Function            PT Goals (current goals can now be found in the care plan section) Progress towards PT goals: Progressing toward goals(slowly)    Frequency    Min 3X/week      PT Plan Current plan remains appropriate    Co-evaluation              AM-PAC PT "6 Clicks" Daily Activity  Outcome Measure  Difficulty turning over in bed (including adjusting bedclothes, sheets and blankets)?: A Little  Difficulty moving from lying on back to sitting on the side of the bed? : A Little Difficulty sitting down on and standing up from a chair with arms (e.g., wheelchair, bedside commode, etc,.)?: Unable Help needed moving to and from a bed to chair (including a wheelchair)?: A Little Help needed walking in hospital room?: Total Help needed climbing 3-5 steps with a railing? : Total 6 Click Score: 12    End of Session Equipment Utilized During Treatment: Gait belt Activity Tolerance: Patient limited by fatigue Patient left: in chair;with chair alarm set;with call bell/phone within reach Nurse Communication: Mobility status PT Visit Diagnosis: Unsteadiness on feet (R26.81)     Time: 0300-9233 PT Time Calculation (min) (ACUTE ONLY): 14 min  Charges:  $Therapeutic Activity: 8-22 mins                    G CodesMagda Kiel, [name] [phone] 02/02/2018    Reginia Naas 02/02/2018, 1:29 PM

## 2018-02-02 NOTE — Consult Note (Signed)
Referring Provider: No ref. provider found Primary Care Physician:  Lanice Shirts, MD Primary Nephrologist:     Reason for Consultation:   Hyponatremia with  Urine osm 316 Sodium 42    Appears to be volume depleted with increased fluid intake and poor solute intake      HPI: 84 y.o.femalewith medical history significant ofchronic hyponatremia, CAD, three-vessel CABG, GI bleed, diverticulitis, history of A. fib, hypertension  She was admitted form home with altered mental status and increased lethargy and falls   It was noted that her sodium was 113   This responded to IV saline    Past Medical History:  Diagnosis Date  . Arthritis    osteo   in back  . Coronary artery disease 2007   3 vessel CABG with LIMA-LAD, SVG to diag, and SVG - Ramus  . Diverticulitis   . GI bleeding 09/09/2013  . Hearing loss of left ear 07/2013  . History of atrial fibrillation; review of all ECG are most consistent with atrial tachycardia    had rash with amiodarone; no anticoagulation due to GI bleed in December 2012  . History of GI bleed Dec 2012  . History of hyperkalemia   . Hyperlipidemia   . Hypertension   . Hyponatremia   . Pacemaker-Medtronic 02/09/2012  . Palpitations     Past Surgical History:  Procedure Laterality Date  . ABDOMINAL HYSTERECTOMY    . COLONOSCOPY N/A 09/12/2013   Procedure: COLONOSCOPY;  Surgeon: Wonda Horner, MD;  Location: [facility] ENDOSCOPY;  Service: Endoscopy;  Laterality: N/A;  . CORONARY ARTERY BYPASS GRAFT  Feb 2007   Dr. Cyndia Bent;   . ESOPHAGOGASTRODUODENOSCOPY  08/04/2011   Procedure: ESOPHAGOGASTRODUODENOSCOPY (EGD);  Surgeon: Cleotis Nipper, MD;  Location: [facility] ENDOSCOPY;  Service: Endoscopy;  Laterality: N/A;  do at bedside  . ESOPHAGOGASTRODUODENOSCOPY N/A 09/11/2013   Procedure: ESOPHAGOGASTRODUODENOSCOPY (EGD);  Surgeon: Jeryl Columbia, MD;  Location: [facility] ENDOSCOPY;  Service: Endoscopy;  Laterality: N/A;  . IR KYPHO THORACIC WITH BONE BIOPSY  04/11/2017  . IR  RADIOLOGIST EVAL & MGMT  04/04/2017  . MAZE    . pace maker    . PARTIAL HYSTERECTOMY    . PERMANENT PACEMAKER INSERTION N/A 02/08/2012   Procedure: PERMANENT PACEMAKER INSERTION;  Surgeon: Deboraha Sprang, MD;  Location: [facility] CATH LAB;  Service: Cardiovascular;  Laterality: N/A;    Prior to Admission medications   Medication Sig Start Date End Date Taking? Authorizing Provider  Calcium Carbonate-Vitamin D (CALTRATE 600+D PO) Take 1 tablet by mouth daily.    Yes [provider]  cefdinir (OMNICEF) 300 MG capsule TAKE ONE CAPSULE BY MOUTH BID FOR 5 DAYS 01/28/18  Yes [provider]  diltiazem (CARDIZEM CD) 240 MG 24 hr capsule TAKE 1 CAPSULE (240 MG TOTAL) BY MOUTH DAILY. 06/29/17  Yes Nahser, Wonda Cheng, MD  diphenhydramine-acetaminophen (TYLENOL PM) 25-500 MG TABS tablet Take 2 tablets by mouth at bedtime.   Yes [provider]  ELIQUIS 2.5 MG TABS tablet TAKE 1 TABLET BY MOUTH TWICE A DAY 09/14/17  Yes Nahser, Wonda Cheng, MD  metoprolol tartrate (LOPRESSOR) 25 MG tablet Take 1 tablet (25 mg total) by mouth 2 (two) times daily. 08/02/17  Yes Nahser, Wonda Cheng, MD  pantoprazole (PROTONIX) 40 MG tablet TAKE 1 TABLET (40 MG TOTAL) BY MOUTH DAILY. 09/17/14  Yes Schoenhoff, Altamese Cabal, MD  rosuvastatin (CRESTOR) 10 MG tablet Take 1 tablet (10 mg total) by mouth daily. 07/10/17  Yes Nahser, Arnette Norris  J, MD    Current Facility-Administered Medications  Medication Dose Route Frequency Provider Last Rate Last Dose  . 0.9 %  sodium chloride infusion   Intravenous Continuous Raiford Noble [place], [place] 75 mL/hr at 02/02/18 3419    . acetaminophen (TYLENOL) tablet 650 mg  650 mg Oral Q6H PRN Gardiner Barefoot, NP   650 mg at 02/01/18 2045  . apixaban (ELIQUIS) tablet 2.5 mg  2.5 mg Oral BID Georgette Shell, MD   2.5 mg at 02/02/18 0944  . diltiazem (CARDIZEM CD) 24 hr capsule 360 mg  360 mg Oral Daily Dorothy Spark, MD   360 mg at 02/02/18 0944  . diphenhydrAMINE (BENADRYL) capsule  25 mg  25 mg Oral QHS PRN Gardiner Barefoot, NP   25 mg at 02/01/18 2045  . feeding supplement (ENSURE ENLIVE) (ENSURE ENLIVE) liquid 237 mL  237 mL Oral Q24H Sheikh, Omair Latif, DO   237 mL at 02/01/18 1414  . furosemide (LASIX) injection 40 mg  40 mg Intravenous Once [place], Omair Latif, DO      . metoprolol tartrate (LOPRESSOR) tablet 75 mg  75 mg Oral BID Dorothy Spark, MD   75 mg at 02/02/18 0944  . multivitamin with minerals tablet 1 tablet  1 tablet Oral Daily Raiford Noble [place], [place]   1 tablet at 02/02/18 0944  . pantoprazole (PROTONIX) EC tablet 40 mg  40 mg Oral Daily Georgette Shell, MD   40 mg at 02/02/18 0944  . rosuvastatin (CRESTOR) tablet 10 mg  10 mg Oral Daily Georgette Shell, MD   10 mg at 02/02/18 0944    Allergies as of 01/29/2018 - Review Complete 01/29/2018  Allergen Reaction Noted  . Amiodarone Hives 09/19/2011  . Aspirin Other (See Comments) 02/06/2012  . Lipitor [atorvastatin calcium] Other (See Comments) 01/06/2011  . Nsaids Other (See Comments) 10/09/2013  . Tolmetin Other (See Comments) 01/27/2015  . Naproxen Other (See Comments) 08/30/2015  . Niacin Rash 01/27/2015  . Niaspan [niacin er] Rash 01/06/2011    Family History  Problem Relation Age of Onset  . Heart failure Father   . Kidney failure Father   . Bone cancer Brother   . Kidney failure Sister   . Colon cancer Sister   . Cancer Sister     Social History   Socioeconomic History  . Marital status: Widowed    Spouse name: Not on file  . Number of children: Not on file  . Years of education: Not on file  . Highest education level: Not on file  Occupational History  . Not on file  Social Needs  . Financial resource strain: Not on file  . Food insecurity:    Worry: Not on file    Inability: Not on file  . Transportation needs:    Medical: Not on file    Non-medical: Not on file  Tobacco Use  . Smoking status: Never Smoker  . Smokeless tobacco: Never Used  Substance  and Sexual Activity  . Alcohol use: No  . Drug use: No  . Sexual activity: Never  Lifestyle  . Physical activity:    Days per week: Not on file    Minutes per session: Not on file  . Stress: Not on file  Relationships  . Social connections:    Talks on phone: Not on file    Gets together: Not on file    Attends religious service: Not on file    Active member  of club or organization: Not on file    Attends meetings of clubs or organizations: Not on file    Relationship status: Not on file  . Intimate partner violence:    Fear of current or ex partner: Not on file    Emotionally abused: Not on file    Physically abused: Not on file    Forced sexual activity: Not on file  Other Topics Concern  . Not on file  Social History Narrative   Ambulatory usually independently, plays golf   Lives at home with family   Daughter in law Veneta Sliter [phone]    Review of Systems: Gen: Denies any fever, chills, sweats, + anorexia, + fatigue, + weakness  HEENT: No visual complaints, No history of Retinopathy. Normal external appearance No Epistaxis or Sore throat. No sinusitis.   CV: Denies chest pain, angina, palpitations, syncope, orthopnea, PND, peripheral edema, and claudication. Resp: Denies dyspnea at rest, dyspnea with exercise, cough, sputum, wheezing, coughing up blood, and pleurisy. GI: Poor oral intake no pain underlying dementia  GU : Denies urinary burning, blood in urine, urinary frequency, urinary hesitancy, nocturnal urination, and urinary incontinence.  No renal calculi. MS: Denies joint pain, limitation of movement, and swelling, stiffness, low back pain, extremity pain. Denies muscle weakness, cramps, atrophy.  No use of non steroidal antiinflammatory drugs. Derm: Denies rash, itching, dry skin, hives, moles, warts, or unhealing ulcers.  Psych: Denies depression, anxiety, memory loss, suicidal ideation, hallucinations, paranoia, and confusion. Heme: Denies bruising,  bleeding, and enlarged lymph nodes.    Physical Exam: Vital signs in last 24 hours: Temp:  [97.7 F (36.5 C)-98.7 F (37.1 C)] 98.3 F (36.8 C) (06/21 0514) Pulse Rate:  [76-92] 85 (06/21 0944) Resp:  [14-18] 14 (06/21 0514) BP: (95-141)/(61-81) 138/64 (06/21 0944) SpO2:  [97 %-100 %] 98 % (06/21 0514) Last BM Date: 01/28/18 General:   Alert,  Elderly  HEENT normal  No JVP  Lungs:  Clear throughout to auscultation.   No wheezes, crackles, or rhonchi. No acute distress. Heart:  Regular rate and rhythm; no murmurs, clicks, rubs,  or gallops. Abdomen:  Soft, nontender and nondistended. No masses, hepatosplenomegaly or hernias noted. Normal bowel sounds, without guarding, and without rebound.   Msk:  Symmetrical without gross deformities. Normal posture. Pulses:  No carotid, renal, femoral bruits. DP and PT symmetrical and equal Extremities:  Without clubbing or edema. Neurologic:  Alert and  oriented x4;  grossly normal neurologically. Skin:  Intact without significant lesions or rashes.    Intake/Output from previous day: 06/20 0701 - 06/21 0700 In: 3283.1 [P.O.:1507; I.V.:1585; IV Piggyback:191.1] Out: 630 [Urine:630] Intake/Output this shift: Total I/O In: 120 [P.O.:120] Out: -   Lab Results: Recent Labs    02/01/18 0313 02/02/18 0418  WBC 8.0 8.4  HGB 12.1 11.6*  HCT 34.0* 32.4*  PLT 182 184   BMET Recent Labs    01/31/18 0315 02/01/18 0313 02/02/18 0418  NA 121* 122* 122*  K 3.4* 4.3 4.0  CL 89* 92* 92*  CO2 24 24 24   GLUCOSE 105* 134* 112*  BUN 21* 20 21*  CREATININE 0.75 0.80 0.74  CALCIUM 8.4* 8.1* 8.2*  PHOS  --  2.3* 3.0   LFT Recent Labs    02/02/18 0418  PROT 5.5*  ALBUMIN 3.3*  AST 18  ALT 17  ALKPHOS 51  BILITOT 0.4   PT/INR No results for input(s): LABPROT, INR in the last 72 hours. Hepatitis Panel No results for  input(s): HEPBSAG, HCVAB, HEPAIGM, HEPBIGM in the last 72 hours.  Studies/Results: No results  found.  Assessment/Plan:  Hyponatremia  Labs initially look like volume depletion and patient clearly got better with normal saline  This would not be the case with SIADH   There is no evidence of volume overloaded state  No nephrosis no CHF and no cirrhosis   I think that there is more than likely very poor oral solute intake and I would proceed with salt tabs  9 g a day   I think fluid restrict as well. We shall see how she does with follow up labs  Although there is nothing critical  I will also recheck urine studies   LOS: 4 Donna Ortega W @TODAY @11 :43 AM

## 2018-02-03 LAB — CBC WITH DIFFERENTIAL/PLATELET
Basophils Absolute: 0 10*3/uL (ref 0.0–0.1)
Basophils Relative: 0 %
Eosinophils Absolute: 0.1 10*3/uL (ref 0.0–0.7)
Eosinophils Relative: 1 %
HCT: 31.6 % — ABNORMAL LOW (ref 36.0–46.0)
Hemoglobin: 11.1 g/dL — ABNORMAL LOW (ref 12.0–15.0)
Lymphocytes Relative: 17 %
Lymphs Abs: 1.4 10*3/uL (ref 0.7–4.0)
MCH: 31.4 pg (ref 26.0–34.0)
MCHC: 35.1 g/dL (ref 30.0–36.0)
MCV: 89.5 fL (ref 78.0–100.0)
Monocytes Absolute: 0.7 10*3/uL (ref 0.1–1.0)
Monocytes Relative: 9 %
Neutro Abs: 5.9 10*3/uL (ref 1.7–7.7)
Neutrophils Relative %: 73 %
Platelets: 189 10*3/uL (ref 150–400)
RBC: 3.53 MIL/uL — ABNORMAL LOW (ref 3.87–5.11)
RDW: 12.6 % (ref 11.5–15.5)
WBC: 8.1 10*3/uL (ref 4.0–10.5)

## 2018-02-03 LAB — COMPREHENSIVE METABOLIC PANEL
ALBUMIN: 3.2 g/dL — AB (ref 3.5–5.0)
ALT: 16 U/L (ref 14–54)
ANION GAP: 9 (ref 5–15)
AST: 18 U/L (ref 15–41)
Alkaline Phosphatase: 47 U/L (ref 38–126)
BUN: 24 mg/dL — ABNORMAL HIGH (ref 6–20)
CO2: 26 mmol/L (ref 22–32)
Calcium: 8.2 mg/dL — ABNORMAL LOW (ref 8.9–10.3)
Chloride: 90 mmol/L — ABNORMAL LOW (ref 101–111)
Creatinine, Ser: 0.76 mg/dL (ref 0.44–1.00)
GFR calc Af Amer: >60 mL/min (ref >60)
GLUCOSE: 111 mg/dL — AB (ref 65–99)
POTASSIUM: 3.7 mmol/L (ref 3.5–5.1)
Sodium: 125 mmol/L — ABNORMAL LOW (ref 135–145)
TOTAL PROTEIN: 5.5 g/dL — AB (ref 6.5–8.1)
Total Bilirubin: 0.3 mg/dL (ref 0.3–1.2)

## 2018-02-03 LAB — CULTURE, BLOOD (ROUTINE X 2)
CULTURE: NO GROWTH
Culture: NO GROWTH

## 2018-02-03 LAB — MAGNESIUM: MAGNESIUM: 1.6 mg/dL — AB (ref 1.7–2.4)

## 2018-02-03 LAB — PHOSPHORUS: PHOSPHORUS: 2.5 mg/dL (ref 2.5–4.6)

## 2018-02-03 MED ORDER — DEMECLOCYCLINE HCL 150 MG PO TABS
150.0000 mg | ORAL_TABLET | Freq: Two times a day (BID) | ORAL | Status: DC
  Administered 2018-02-03 – 2018-02-08 (×11): 150 mg via ORAL
  Filled 2018-02-03 (×12): qty 1

## 2018-02-03 MED ORDER — SODIUM CHLORIDE 0.9 % IV SOLN
INTRAVENOUS | Status: AC
Start: 2018-02-03 — End: 2018-02-04
  Administered 2018-02-03 – 2018-02-04 (×2): via INTRAVENOUS

## 2018-02-03 MED ORDER — MAGNESIUM SULFATE 2 GM/50ML IV SOLN
2.0000 g | Freq: Once | INTRAVENOUS | Status: AC
  Administered 2018-02-03: 2 g via INTRAVENOUS
  Filled 2018-02-03: qty 50

## 2018-02-03 MED ORDER — ALPRAZOLAM 0.25 MG PO TABS
0.2500 mg | ORAL_TABLET | Freq: Once | ORAL | Status: AC
  Administered 2018-02-04: 0.25 mg via ORAL
  Filled 2018-02-03: qty 1

## 2018-02-03 MED ORDER — FUROSEMIDE 10 MG/ML IJ SOLN
40.0000 mg | Freq: Once | INTRAMUSCULAR | Status: AC
  Administered 2018-02-03: 40 mg via INTRAVENOUS
  Filled 2018-02-03: qty 4

## 2018-02-03 NOTE — Consult Note (Signed)
Referring Provider: No ref. provider found Primary Care Physician:  Lanice Shirts, MD Primary Nephrologist:     Reason for Consultation:   Hyponatremia with  Urine osm 316 Sodium 42    Appears to be volume depleted with increased fluid intake and poor solute intake      HPI: 84 y.o.femalewith medical history significant ofchronic hyponatremia, CAD, three-vessel CABG, GI bleed, diverticulitis, history of A. fib, hypertension  She was admitted form home with altered mental status and increased lethargy and falls   It was noted that her sodium was 113   This responded to IV saline   She has continued to have a good response to IV saline and lasix   Sodium has now increased to 125   Urine osm 658   Urine sodium 93   This looks like component of SIADH     I would continue lasix  IV  --- > 20 mg daily oral 6/23  Continue salt tabs and fluid restriction and will add demeclocycline 150 mg twice a daily    Past Medical History:  Diagnosis Date  . Arthritis    osteo   in back  . Coronary artery disease 2007   3 vessel CABG with LIMA-LAD, SVG to diag, and SVG - Ramus  . Diverticulitis   . GI bleeding 09/09/2013  . Hearing loss of left ear 07/2013  . History of atrial fibrillation; review of all ECG are most consistent with atrial tachycardia    had rash with amiodarone; no anticoagulation due to GI bleed in December 2012  . History of GI bleed Dec 2012  . History of hyperkalemia   . Hyperlipidemia   . Hypertension   . Hyponatremia   . Pacemaker-Medtronic 02/09/2012  . Palpitations     Past Surgical History:  Procedure Laterality Date  . ABDOMINAL HYSTERECTOMY    . COLONOSCOPY N/A 09/12/2013   Procedure: COLONOSCOPY;  Surgeon: Wonda Horner, MD;  Location: [facility] ENDOSCOPY;  Service: Endoscopy;  Laterality: N/A;  . CORONARY ARTERY BYPASS GRAFT  Feb 2007   Dr. Cyndia Bent;   . ESOPHAGOGASTRODUODENOSCOPY  08/04/2011   Procedure: ESOPHAGOGASTRODUODENOSCOPY (EGD);  Surgeon: Cleotis Nipper, MD;  Location: [facility] ENDOSCOPY;  Service: Endoscopy;  Laterality: N/A;  do at bedside  . ESOPHAGOGASTRODUODENOSCOPY N/A 09/11/2013   Procedure: ESOPHAGOGASTRODUODENOSCOPY (EGD);  Surgeon: Jeryl Columbia, MD;  Location: [facility] ENDOSCOPY;  Service: Endoscopy;  Laterality: N/A;  . IR KYPHO THORACIC WITH BONE BIOPSY  04/11/2017  . IR RADIOLOGIST EVAL & MGMT  04/04/2017  . MAZE    . pace maker    . PARTIAL HYSTERECTOMY    . PERMANENT PACEMAKER INSERTION N/A 02/08/2012   Procedure: PERMANENT PACEMAKER INSERTION;  Surgeon: Deboraha Sprang, MD;  Location: [facility] CATH LAB;  Service: Cardiovascular;  Laterality: N/A;    Prior to Admission medications   Medication Sig Start Date End Date Taking? Authorizing Provider  Calcium Carbonate-Vitamin D (CALTRATE 600+D PO) Take 1 tablet by mouth daily.    Yes [provider]  cefdinir (OMNICEF) 300 MG capsule TAKE ONE CAPSULE BY MOUTH BID FOR 5 DAYS 01/28/18  Yes [provider]  diltiazem (CARDIZEM CD) 240 MG 24 hr capsule TAKE 1 CAPSULE (240 MG TOTAL) BY MOUTH DAILY. 06/29/17  Yes Nahser, Wonda Cheng, MD  diphenhydramine-acetaminophen (TYLENOL PM) 25-500 MG TABS tablet Take 2 tablets by mouth at bedtime.   Yes [provider]  ELIQUIS 2.5 MG TABS tablet TAKE 1 TABLET BY MOUTH TWICE A DAY  09/14/17  Yes Nahser, Wonda Cheng, MD  metoprolol tartrate (LOPRESSOR) 25 MG tablet Take 1 tablet (25 mg total) by mouth 2 (two) times daily. 08/02/17  Yes Nahser, Wonda Cheng, MD  pantoprazole (PROTONIX) 40 MG tablet TAKE 1 TABLET (40 MG TOTAL) BY MOUTH DAILY. 09/17/14  Yes Schoenhoff, Altamese Cabal, MD  rosuvastatin (CRESTOR) 10 MG tablet Take 1 tablet (10 mg total) by mouth daily. 07/10/17  Yes Nahser, Wonda Cheng, MD    Current Facility-Administered Medications  Medication Dose Route Frequency Provider Last Rate Last Dose  . acetaminophen (TYLENOL) tablet 650 mg  650 mg Oral Q6H PRN Gardiner Barefoot, NP   650 mg at 02/01/18 2045  . apixaban (ELIQUIS) tablet 2.5 mg   2.5 mg Oral BID Georgette Shell, MD   2.5 mg at 02/02/18 2109  . diltiazem (CARDIZEM CD) 24 hr capsule 360 mg  360 mg Oral Daily Dorothy Spark, MD   360 mg at 02/02/18 0944  . diphenhydrAMINE (BENADRYL) capsule 25 mg  25 mg Oral QHS PRN Gardiner Barefoot, NP   25 mg at 02/02/18 2114  . feeding supplement (ENSURE ENLIVE) (ENSURE ENLIVE) liquid 237 mL  237 mL Oral Q24H Sheikh, Omair Latif, DO   237 mL at 02/02/18 1325  . metoprolol tartrate (LOPRESSOR) tablet 75 mg  75 mg Oral BID Dorothy Spark, MD   75 mg at 02/02/18 2109  . multivitamin with minerals tablet 1 tablet  1 tablet Oral Daily Raiford Noble [place], [place]   1 tablet at 02/02/18 0944  . pantoprazole (PROTONIX) EC tablet 40 mg  40 mg Oral Daily Georgette Shell, MD   40 mg at 02/02/18 0944  . rosuvastatin (CRESTOR) tablet 10 mg  10 mg Oral Daily Georgette Shell, MD   10 mg at 02/02/18 0944  . sodium chloride tablet 2 g  2 g Oral TID WC Edrick Oh, MD   2 g at 02/03/18 2831    Allergies as of 01/29/2018 - Review Complete 01/29/2018  Allergen Reaction Noted  . Amiodarone Hives 09/19/2011  . Aspirin Other (See Comments) 02/06/2012  . Lipitor [atorvastatin calcium] Other (See Comments) 01/06/2011  . Nsaids Other (See Comments) 10/09/2013  . Tolmetin Other (See Comments) 01/27/2015  . Naproxen Other (See Comments) 08/30/2015  . Niacin Rash 01/27/2015  . Niaspan [niacin er] Rash 01/06/2011    Family History  Problem Relation Age of Onset  . Heart failure Father   . Kidney failure Father   . Bone cancer Brother   . Kidney failure Sister   . Colon cancer Sister   . Cancer Sister     Social History   Socioeconomic History  . Marital status: Widowed    Spouse name: Not on file  . Number of children: Not on file  . Years of education: Not on file  . Highest education level: Not on file  Occupational History  . Not on file  Social Needs  . Financial resource strain: Not on file  . Food insecurity:     Worry: Not on file    Inability: Not on file  . Transportation needs:    Medical: Not on file    Non-medical: Not on file  Tobacco Use  . Smoking status: Never Smoker  . Smokeless tobacco: Never Used  Substance and Sexual Activity  . Alcohol use: No  . Drug use: No  . Sexual activity: Never  Lifestyle  . Physical activity:    Days per week:  Not on file    Minutes per session: Not on file  . Stress: Not on file  Relationships  . Social connections:    Talks on phone: Not on file    Gets together: Not on file    Attends religious service: Not on file    Active member of club or organization: Not on file    Attends meetings of clubs or organizations: Not on file    Relationship status: Not on file  . Intimate partner violence:    Fear of current or ex partner: Not on file    Emotionally abused: Not on file    Physically abused: Not on file    Forced sexual activity: Not on file  Other Topics Concern  . Not on file  Social History Narrative   Ambulatory usually independently, plays golf   Lives at home with family   Daughter in law Noorah Giammona [phone]    Review of Systems: Gen: Denies any fever, chills, sweats, + anorexia, + fatigue, + weakness  HEENT: No visual complaints, No history of Retinopathy. Normal external appearance No Epistaxis or Sore throat. No sinusitis.   CV: Denies chest pain, angina, palpitations, syncope, orthopnea, PND, peripheral edema, and claudication. Resp: Denies dyspnea at rest, dyspnea with exercise, cough, sputum, wheezing, coughing up blood, and pleurisy. GI: Poor oral intake no pain underlying dementia  GU : Denies urinary burning, blood in urine, urinary frequency, urinary hesitancy, nocturnal urination, and urinary incontinence.  No renal calculi. MS: Denies joint pain, limitation of movement, and swelling, stiffness, low back pain, extremity pain. Denies muscle weakness, cramps, atrophy.  No use of non steroidal antiinflammatory  drugs. Derm: Denies rash, itching, dry skin, hives, moles, warts, or unhealing ulcers.  Psych: Denies depression, anxiety, memory loss, suicidal ideation, hallucinations, paranoia, and confusion. Heme: Denies bruising, bleeding, and enlarged lymph nodes.    Physical Exam: Vital signs in last 24 hours: Temp:  [97.6 F (36.4 C)-98.9 F (37.2 C)] 98.4 F (36.9 C) (06/22 0451) Pulse Rate:  [77-83] 77 (06/22 0451) Resp:  [18] 18 (06/22 0451) BP: (118-149)/(58-65) 149/64 (06/22 0451) SpO2:  [97 %-100 %] 97 % (06/22 0451) Last BM Date: 02/02/18 General:   Alert,  Elderly  HEENT normal  No JVP  Lungs:  Clear throughout to auscultation.   No wheezes, crackles, or rhonchi. No acute distress. Heart:  Regular rate and rhythm; no murmurs, clicks, rubs,  or gallops. Abdomen:  Soft, nontender and nondistended. No masses, hepatosplenomegaly or hernias noted. Normal bowel sounds, without guarding, and without rebound.   Msk:  Symmetrical without gross deformities. Normal posture. Pulses:  No carotid, renal, femoral bruits. DP and PT symmetrical and equal Extremities:  Without clubbing or edema. Neurologic:  Alert and  oriented x4;  grossly normal neurologically. Skin:  Intact without significant lesions or rashes.    Intake/Output from previous day: 06/21 0701 - 06/22 0700 In: 2095 [P.O.:440; I.V.:1655] Out: -  Intake/Output this shift: Total I/O In: -  Out: 100 [Urine:100]  Lab Results: Recent Labs    02/01/18 0313 02/02/18 0418 02/03/18 0427  WBC 8.0 8.4 8.1  HGB 12.1 11.6* 11.1*  HCT 34.0* 32.4* 31.6*  PLT 182 184 189   BMET Recent Labs    02/01/18 0313 02/02/18 0418 02/03/18 0427  NA 122* 122* 125*  K 4.3 4.0 3.7  CL 92* 92* 90*  CO2 24 24 26   GLUCOSE 134* 112* 111*  BUN 20 21* 24*  CREATININE 0.80 0.74 0.76  CALCIUM 8.1* 8.2* 8.2*  PHOS 2.3* 3.0 2.5   LFT Recent Labs    02/03/18 0427  PROT 5.5*  ALBUMIN 3.2*  AST 18  ALT 16  ALKPHOS 47  BILITOT 0.3    PT/INR No results for input(s): LABPROT, INR in the last 72 hours. Hepatitis Panel No results for input(s): HEPBSAG, HCVAB, HEPAIGM, HEPBIGM in the last 72 hours.  Studies/Results: No results found.  Assessment/Plan:  Hyponatremia  Labs initially look like volume depletion and patient clearly got better with normal saline  This would not be the case with SIADH   There is no evidence of volume overloaded state  No nephrosis no CHF and no cirrhosis   I think that there is more than likely very poor oral solute intake and I would proceed with salt tabs  9 g a day   I think fluid restrict as well.  Repeat urine studies show a pattern of high osm and high sodium this is consistent with SIADH and would continue lasix and salt and would also add demeclocycline 150 mg bid   LOS: 5 Loreley Schwall W @TODAY @10 :11 AM

## 2018-02-03 NOTE — Progress Notes (Cosign Needed)
Patient has been intermittently confused today. She was A&O x 1 and had a difficult time remembering how to call for help when she needed to use the bathroom.   Patient complied with all of her medications.  Patient is pleasant.

## 2018-02-03 NOTE — Progress Notes (Signed)
PROGRESS NOTE    Donna Ortega  YIR:[id number] DOB: [birth year] DOA: 01/29/2018 PCP: Lanice Shirts, MD   Brief Narrative:  Donna Ortega is a 84 y.o. female with medical history significant of chronic hyponatremia, CAD, three-vessel CABG, GI bleed, diverticulitis, history of A. fib, hypertension and other comorbidites who was admitted with altered mental status.  Patient was brought in by her daughter-in-law who lives with the patient.  According to daughter-in-law patient had altered mental status few days ago to go to urgent care diagnosed with UTI was given antibiotics with results.  She has taken 3 doses of her antibiotic.  Patient had been more and more lethargic over the last few days.  She had to climb 15 steps to get out of the house she was very scared to take her upstairs because of falls. Has been having generalized weakness and was found to have a Na+ of 113. On Admission. Admitted for Metabolic Encephalopathy, Acute on Chronic Hyponatremia, and Atrial Fibrillation. Cardiology consulted and adjusting medications and considering Cardioversion once she recovers from Hyponatremia. Currently receiving IVF Hydration with NS at 75 mL/hr and will continue for now and because Na+ not significantly improving will consult Nephrology for further evaluation and recommendations and they are recommending Fluid Restriction, Salt tablets 2 grams TID, and rechecking Urine Studies. Per my discussion with Dr. Justin Mend this AM he recommended continuing IVF for now and giving 40 mg of IV Lasix again and switching to po Lasix 20 mg.   Assessment & Plan:   Active Problems:   Atrial fibrillation, chronic (HCC)   Hyponatremia  Metabolic Encephalopathy in the setting of Acute on Chronic Hyponatremia and likely underlying chronic undiagnosed Dementia -Appeared dry on Admission, with reported poor oral intake -Sodium on presentation was 113 and improving with gentle IVF Hydration with NS at 75 mL/hr; Sodium is  improved at 125 -Will continue IVF for another 24 hours and re-evaluate at the recommendations of Nephrology; Nephrology also recommended giving another dose of IV Lasix 40 mg x1 today and changing to po Lasix 20 mg tomorrow  -Nephrology  -Urine Studies are being resent and Urine Osmolality 658, and Urine Sodium 93 and likely component of SIADH -Urinalysis Unremarkable and Urine Cx Negative  -Patient remains Intermittently confused and disoriented and likely from Underlying Dementia but has not changed from yesterday's examination and is not worse -Placed on Delirium Precautions; Avoid Sundowning  -Unlikely UTI as she was recently treated for it and Urinalysis and Urine Cx Negative  -Likley has some underlying Dementia as she is forgetful and continues to not know that she is in the hospital  -Nephrology starting Fluid Restriction Diet (1500 mL) and Salt Tablets 2grams po TID; They have also started Demeclocycline 150 mg po q12h -Discussed the case with Dr. Justin Mend with Nephrology who recommends patient will be okay to discharge after her sodium is above 128  Hyponatremia -See Above  Paroxysmal Atrial Fibrillation with slight RVR Hx of Atrial Tachycardia Hc of SSS s/p PPM -On BB and Anticoagulation with Apixaban -Currently in Rate controlled A Fib on Telemetry with rates in the 80's  -Cardiology consulted, may consider cardioversion if sodium improves, meds adjustment per cardiology and have increased Diltaizem to 360 mg po daily and Increased Metoprolol Tartrate to 75mg  po BID yesterday; They are recommending continuing current regimen and now stating possible Cardioversion as an outpatient  -C/w with Telemetry   AKI on CKDII, improving  -Likely from dehydration -She was Recently treated for uti with  Omnicef,  -Urinalysis on presentation was unimpressive -BUN/Cr 29/1.01 on presentation and now BUN/Cr is 24/0.76 -Continue Gentle IVF Hydration with NS at 75 mL/hr as above for another 24  hours   FTT/Generalized Weakness -Has reported poor oral intake; Consult Nutritionist  -PT/OT evaluated and recommending SNF still and Recommending Rolling Walker with 5" Wheels  -Education officer, museum consulted for assistance with placement and anticipate D/C once Na+ is improved more   HTN -BP was 151/76 -Continue with Diltiazem 360 mill grams p.o. daily along with Metoprolol Tartrate 75 mg p.o. twice daily -Given another Dose of IV Lasix 40 mg x1 -Continue to Monitor closely and if necessary add IV Hydralazine prn  HLD -Continue with Rosuvastatin 10 mg p.o. daily  Hypokalemia, improved -Patient's potassium was 3.4 and improved to 3.7 -Continue to monitor and replete as necessary -Repeat CMP in a.m.  Hypomagnesemia -Patient's Mag Level was 1.6 -Replete with IV mag sulfate 2 g x1 -Continue to Monitor and Replete as Necessary -Repeat Mag Level in AM   Hypophosphatemia  -Her phosphorus level this morning is 2.5 -Continue to monitor and replete as necessary -Repeat phosphorus level in a.m.  Recent UTI, s/p treatment -Received 3 Doses of Abx as an outpatient -Urinalysis and Urine Cx unremarkable here and not indicative of UTI -Continue to Monitor for Urinary Symptoms  Hyperglycemia -Patient's Blood Sugars have been ranging from 102-134 on Daily BMP/CMP's -Check HbA1c; Last HbA1c was 5.6 -Continue to Monitor Blood Sugars and if consistently elevated will consider adding Sensistive Novolog SSI AC  DVT prophylaxis: Anticoagulated with Apxiaban 2.5 mg po BID  Code Status: DO NOT RESUSCITATE Family Communication: Discussed with Son at Bedside Disposition Plan: Anticipate D/C to SNF in next 48 hours if Medically Stable and no other recommendations by Cardiology or Nephrology; Transferred to Telemetry a few days ago   Consultants:   Cardiology Dr. Meda Coffee  Nephrology Dr. Edrick Oh   Procedures: None   Antimicrobials: Anti-infectives (From admission, onward)   None      Subjective: Patient was seen and examined and states that she had a long morning but had no other complaints or concerns.  Still remains intermittently confused and did not know she was in the hospital but did recognize me as her physician.  Patient knows the date and the year along with the president but again did not know where she lived.  No other concerns or complaints at this time and is doing okay.  Objective: Vitals:   02/02/18 0944 02/02/18 1515 02/02/18 2025 02/03/18 0451  BP: 138/64 118/65 (!) 123/58 (!) 149/64  Pulse: 85 83 82 77  Resp:  18 18 18   Temp:  97.6 F (36.4 C) 98.9 F (37.2 C) 98.4 F (36.9 C)  TempSrc:  Oral Oral Oral  SpO2:  99% 100% 97%  Weight:      Height:        Intake/Output Summary (Last 24 hours) at 02/03/2018 0757 Last data filed at 02/03/2018 1610 Gross per 24 hour  Intake 2095 ml  Output -  Net 2095 ml   Filed Weights   01/29/18 1318 01/29/18 1900  Weight: 59 kg (130 lb) 59.6 kg (131 lb 6.3 oz)   Examination: Physical Exam:  Constitutional: Thin pleasantly demented elderly Caucasian female who is currently in no acute distress and remains slightly confused still she does not know where she is but knows the date, the year, the president and does recognize me as her physician. Eyes: Sclera are anicteric.  Lids normal.  ENMT: External ears and nose appear normal.  Grossly normal hearing Neck: Supple with no JVD Respiratory: Diminished to auscultation bilaterally with no appreciable wheezing, rales, rhonchi.  She is not wearing any supplemental oxygen via nasal cannula and has unlabored breathing Cardiovascular: Irregularly irregular but rate controlled.  No appreciable murmurs, rubs, gallops.  No lower extremity edema appreciated Abdomen: Soft, nontender, nondistended.  Bowel sounds present all 4 quadrants GU: Deferred. Musculoskeletal: No contractures or cyanosis.  No joint deformities noted Skin: Skin is warm and dry with no appreciable  rashes or lesions limited skin evaluation Neurologic: Cranial nerves II through XII grossly intact no appreciable focal deficits Psychiatric: She is awake and alert but still does not know where she is.  Does not know the month, the date, along with the year and her full name and president of [place].  Does not know where she lives.  Normal mood and affect.  Pleasantly demented  Data Reviewed: I have personally reviewed following labs and imaging studies  CBC: Recent Labs  Lab 01/29/18 1343 02/01/18 0313 02/02/18 0418 02/03/18 0427  WBC 8.9 8.0 8.4 8.1  NEUTROABS 6.9 6.0 6.3 5.9  HGB 14.1 12.1 11.6* 11.1*  HCT 38.1 34.0* 32.4* 31.6*  MCV 85.8 88.3 88.3 89.5  PLT 248 182 184 867   Basic Metabolic Panel: Recent Labs  Lab 01/30/18 0302 01/31/18 0315 02/01/18 0313 02/02/18 0418 02/03/18 0427  NA 117* 121* 122* 122* 125*  K 3.8 3.4* 4.3 4.0 3.7  CL 83* 89* 92* 92* 90*  CO2 23 24 24 24 26   GLUCOSE 102* 105* 134* 112* 111*  BUN 21* 21* 20 21* 24*  CREATININE <0.30* 0.75 0.80 0.74 0.76  CALCIUM 8.4* 8.4* 8.1* 8.2* 8.2*  MG  --  1.6* 2.0 1.7 1.6*  PHOS  --   --  2.3* 3.0 2.5   GFR: Estimated Creatinine Clearance: 39.4 mL/min (by C-G formula based on SCr of 0.76 mg/dL). Liver Function Tests: Recent Labs  Lab 01/29/18 1343 02/01/18 0313 02/02/18 0418 02/03/18 0427  AST 31 22 18 18   ALT 19 16 17 16   ALKPHOS 54 54 51 47  BILITOT 0.9 0.6 0.4 0.3  PROT 7.2 5.7* 5.5* 5.5*  ALBUMIN 4.3 3.4* 3.3* 3.2*   No results for input(s): LIPASE, AMYLASE in the last 168 hours. No results for input(s): AMMONIA in the last 168 hours. Coagulation Profile: Recent Labs  Lab 01/29/18 1343  INR 0.96   Cardiac Enzymes: No results for input(s): CKTOTAL, CKMB, CKMBINDEX, TROPONINI in the last 168 hours. BNP (last 3 results) No results for input(s): PROBNP in the last 8760 hours. HbA1C: No results for input(s): HGBA1C in the last 72 hours. CBG: Recent Labs  Lab 01/29/18 1323   GLUCAP 147*   Lipid Profile: No results for input(s): CHOL, HDL, LDLCALC, TRIG, CHOLHDL, LDLDIRECT in the last 72 hours. Thyroid Function Tests: No results for input(s): TSH, T4TOTAL, FREET4, T3FREE, THYROIDAB in the last 72 hours. Anemia Panel: No results for input(s): VITAMINB12, FOLATE, FERRITIN, TIBC, IRON, RETICCTPCT in the last 72 hours. Sepsis Labs: Recent Labs  Lab 01/29/18 1345  LATICACIDVEN 1.68    Recent Results (from the past 240 hour(s))  Culture, blood (Routine x 2)     Status: None (Preliminary result)   Collection Time: 01/29/18  1:43 PM  Result Value Ref Range Status   Specimen Description   Final    BLOOD RIGHT HAND Performed at [facility] [place], [place]  [address]    Special Requests   Final    BOTTLES DRAWN AEROBIC AND ANAEROBIC Blood Culture results may not be optimal due to an inadequate volume of blood received in culture bottles Performed at [facility], [place] [address], [place], [place] [address]    Culture   Final    NO GROWTH 4 DAYS Performed at [facility], [place] [address], [place], [place] [address]    Report Status PENDING  Incomplete  Culture, blood (Routine x 2)     Status: None (Preliminary result)   Collection Time: 01/29/18  1:43 PM  Result Value Ref Range Status   Specimen Description   Final    BLOOD LEFT ANTECUBITAL Performed at [facility] [address], [place], [place] [address]    Special Requests   Final    BOTTLES DRAWN AEROBIC ONLY Blood Culture results may not be optimal due to an inadequate volume of blood received in culture bottles Performed at [place] [address] [place], [place] [address]    Culture   Final    NO GROWTH 4 DAYS Performed at [facility], [place] [address], [place], [place] [address]    Report Status PENDING  Incomplete  Culture, Urine     Status: None   Collection Time: 01/29/18  6:06  PM  Result Value Ref Range Status   Specimen Description   Final    URINE, CLEAN CATCH Performed at [facility], [place] [address] [place], [place] [address]    Special Requests   Final    NONE Performed at [facility], [place] [address], [place], [place] [address]    Culture   Final    NO GROWTH Performed at [facility], [place] [address] [place], [place] [address]    Report Status 01/31/2018 FINAL  Final  MRSA PCR Screening     Status: None   Collection Time: 01/29/18  6:56 PM  Result Value Ref Range Status   MRSA by PCR NEGATIVE NEGATIVE Final    Comment:        The GeneXpert MRSA Assay (FDA approved for NASAL specimens only), is one component of a comprehensive MRSA colonization surveillance program. It is not intended to diagnose MRSA infection nor to guide or monitor treatment for MRSA infections. Performed at [facility], [place] [address], [place],  [address]     Radiology Studies: No results found. Scheduled Meds: . apixaban  2.5 mg Oral BID  . diltiazem  360 mg Oral Daily  . feeding supplement (ENSURE ENLIVE)  237 mL Oral Q24H  . metoprolol tartrate  75 mg Oral BID  . multivitamin with minerals  1 tablet Oral Daily  . pantoprazole  40 mg Oral Daily  . rosuvastatin  10 mg Oral Daily  . sodium chloride  2 g Oral TID WC   Continuous Infusions: . sodium chloride 75 mL/hr at 02/02/18 2230  . magnesium sulfate 1 - 4 g bolus IVPB      LOS: 5 days   Kerney Elbe, DO [facility] Pager [phone]  If 7PM-7AM, please contact night-coverage [email or web address] Password [facility] 02/03/2018, 7:57 AM

## 2018-02-04 LAB — COMPREHENSIVE METABOLIC PANEL
ALK PHOS: 45 U/L (ref 38–126)
ALT: 17 U/L (ref 14–54)
AST: 19 U/L (ref 15–41)
Albumin: 3.4 g/dL — ABNORMAL LOW (ref 3.5–5.0)
Anion gap: 7 (ref 5–15)
BILIRUBIN TOTAL: 0.7 mg/dL (ref 0.3–1.2)
BUN: 25 mg/dL — ABNORMAL HIGH (ref 6–20)
CHLORIDE: 93 mmol/L — AB (ref 101–111)
CO2: 28 mmol/L (ref 22–32)
Calcium: 8.2 mg/dL — ABNORMAL LOW (ref 8.9–10.3)
Creatinine, Ser: 0.87 mg/dL (ref 0.44–1.00)
GFR calc Af Amer: >60 mL/min (ref >60)
GFR, EST NON AFRICAN AMERICAN: 57 mL/min — AB (ref >60)
GLUCOSE: 110 mg/dL — AB (ref 65–99)
Potassium: 3.6 mmol/L (ref 3.5–5.1)
Sodium: 128 mmol/L — ABNORMAL LOW (ref 135–145)
Total Protein: 5.9 g/dL — ABNORMAL LOW (ref 6.5–8.1)

## 2018-02-04 LAB — CBC WITH DIFFERENTIAL/PLATELET
BASOS ABS: 0 10*3/uL (ref 0.0–0.1)
BASOS PCT: 0 %
Eosinophils Absolute: 0.1 10*3/uL (ref 0.0–0.7)
Eosinophils Relative: 1 %
HEMATOCRIT: 34 % — AB (ref 36.0–46.0)
Hemoglobin: 11.9 g/dL — ABNORMAL LOW (ref 12.0–15.0)
LYMPHS PCT: 15 %
Lymphs Abs: 1.3 10*3/uL (ref 0.7–4.0)
MCH: 31.6 pg (ref 26.0–34.0)
MCHC: 35 g/dL (ref 30.0–36.0)
MCV: 90.2 fL (ref 78.0–100.0)
MONO ABS: 0.8 10*3/uL (ref 0.1–1.0)
Monocytes Relative: 9 %
NEUTROS ABS: 6.4 10*3/uL (ref 1.7–7.7)
Neutrophils Relative %: 75 %
Platelets: 192 10*3/uL (ref 150–400)
RBC: 3.77 MIL/uL — AB (ref 3.87–5.11)
RDW: 12.6 % (ref 11.5–15.5)
WBC: 8.6 10*3/uL (ref 4.0–10.5)

## 2018-02-04 LAB — PHOSPHORUS: Phosphorus: 2.9 mg/dL (ref 2.5–4.6)

## 2018-02-04 LAB — MAGNESIUM: Magnesium: 2 mg/dL (ref 1.7–2.4)

## 2018-02-04 MED ORDER — FUROSEMIDE 20 MG PO TABS
20.0000 mg | ORAL_TABLET | Freq: Every day | ORAL | Status: DC
  Administered 2018-02-04 – 2018-02-08 (×5): 20 mg via ORAL
  Filled 2018-02-04 (×4): qty 1

## 2018-02-04 NOTE — Progress Notes (Signed)
Progress Note  Patient Name: Donna Ortega Date of Encounter: 02/04/2018  Primary Cardiologist: Mertie Moores, MD   Subjective   Confusion noted.  Renal notes reviewed.  No chest pain. Sitting in chair. Pleasant.   Inpatient Medications    Scheduled Meds: . apixaban  2.5 mg Oral BID  . demeclocycline  150 mg Oral Q12H  . diltiazem  360 mg Oral Daily  . feeding supplement (ENSURE ENLIVE)  237 mL Oral Q24H  . metoprolol tartrate  75 mg Oral BID  . multivitamin with minerals  1 tablet Oral Daily  . pantoprazole  40 mg Oral Daily  . rosuvastatin  10 mg Oral Daily  . sodium chloride  2 g Oral TID WC   Continuous Infusions: . sodium chloride 75 mL/hr at 02/04/18 0130   PRN Meds: acetaminophen, diphenhydrAMINE   Vital Signs    Vitals:   02/03/18 1125 02/03/18 1335 02/03/18 2038 02/04/18 0431  BP: (!) 151/76 (!) 141/62 131/62 (!) 153/76  Pulse: 84 70 77 75  Resp:  18 (!) 22 14  Temp:  98.7 F (37.1 C) 98.9 F (37.2 C) 98.4 F (36.9 C)  TempSrc:  Oral Oral Oral  SpO2:  100% 98% 98%  Weight:      Height:        Intake/Output Summary (Last 24 hours) at 02/04/2018 0913 Last data filed at 02/03/2018 1439 Gross per 24 hour  Intake 265 ml  Output 1500 ml  Net -1235 ml   Filed Weights   01/29/18 1318 01/29/18 1900  Weight: 130 lb (59 kg) 131 lb 6.3 oz (59.6 kg)    Telemetry    Sinus rhythm with first-degree AV block- Personally Reviewed  ECG    Prior, atrial fibrillation- Personally Reviewed  Physical Exam   GEN: No acute distress confusion.   Neck: No JVD Cardiac: RRR, no murmurs, rubs, or gallops.  Respiratory: Clear to auscultation bilaterally. GI: Soft, nontender, non-distended  MS: No edema; No deformity. Neuro:  Nonfocal  Psych: Confusion  Labs    Chemistry Recent Labs  Lab 02/02/18 0418 02/03/18 0427 02/04/18 0359  NA 122* 125* 128*  K 4.0 3.7 3.6  CL 92* 90* 93*  CO2 24 26 28   GLUCOSE 112* 111* 110*  BUN 21* 24* 25*  CREATININE 0.74  0.76 0.87  CALCIUM 8.2* 8.2* 8.2*  PROT 5.5* 5.5* 5.9*  ALBUMIN 3.3* 3.2* 3.4*  AST 18 18 19   ALT 17 16 17   ALKPHOS 51 47 45  BILITOT 0.4 0.3 0.7  GFRNONAA >60 >60 57*  GFRAA >60 >60 >60  ANIONGAP 6 9 7      Hematology Recent Labs  Lab 02/02/18 0418 02/03/18 0427 02/04/18 0359  WBC 8.4 8.1 8.6  RBC 3.67* 3.53* 3.77*  HGB 11.6* 11.1* 11.9*  HCT 32.4* 31.6* 34.0*  MCV 88.3 89.5 90.2  MCH 31.6 31.4 31.6  MCHC 35.8 35.1 35.0  RDW 12.4 12.6 12.6  PLT 184 189 192    Cardiac EnzymesNo results for input(s): TROPONINI in the last 168 hours.  Recent Labs  Lab 01/29/18 1351  TROPIPOC 0.00     BNPNo results for input(s): BNP, PROBNP in the last 168 hours.   DDimer No results for input(s): DDIMER in the last 168 hours.   Radiology    No results found.  Cardiac Studies   Normal EF 2015 echocardiogram  Patient Profile     84 y.o. female with paroxysmal atrial fibrillation, hyponatremia, delirium  Assessment & Plan  Paroxysmal atrial fibrillation -She has converted.  She is currently in sinus rhythm with first-degree AV block.  No evidence of pauses.  I think for now, I would continue with current AV nodal blocking regimen.  We do have to watch for any evidence of syncope or significant bradycardia especially given her first-degree AV block.  Chronic anticoagulation -Continue with low-dose Eliquis.  Hyponatremia -Seems to be improving.  Dr. Jason Nest note reviewed.  We will sign off.  Please let us know if we can be of further assistance.  For questions or updates, please contact [place] Please consult [email or web address] for contact info under Cardiology/STEMI.      Signed, Candee Furbish, MD  02/04/2018, 9:13 AM

## 2018-02-04 NOTE — Progress Notes (Signed)
Assumed care of pt from off going RN. Condition stable. No changes in initial assessment at this time.Comt with plan of care

## 2018-02-04 NOTE — Progress Notes (Signed)
[facility] ROUNDING NOTE   Subjective:     84 y.o.femalewith medical history significant ofchronic hyponatremia, CAD, three-vessel CABG, GI bleed, diverticulitis, history of A. fib, hypertension  She was admitted form home with altered mental status and increased lethargy and falls   It was noted that her sodium was 113   This responded to IV saline   She has continued to have a good response to IV saline and lasix  Sodium has now increased to 125   Urine osm 658   Urine sodium 93  This looks like component of SIADH     Paroxysmal AFib converted to sinus rhythm  BP acceptable for now  Afebrile with great urine out put with lasix   I would continue lasix  IV  --- > 20 mg daily oral 6/23       Objective:  Vital signs in last 24 hours:  Temp:  [98.4 F (36.9 C)-98.9 F (37.2 C)] 98.4 F (36.9 C) (06/23 0431) Pulse Rate:  [70-84] 75 (06/23 0431) Resp:  [14-22] 14 (06/23 0431) BP: (131-153)/(62-76) 153/76 (06/23 0431) SpO2:  [98 %-100 %] 98 % (06/23 0431)  Weight change:  Filed Weights   01/29/18 1318 01/29/18 1900  Weight: 130 lb (59 kg) 131 lb 6.3 oz (59.6 kg)    Intake/Output: I/O last 3 completed shifts: In: 1806.3 [P.O.:610; I.V.:1146.3; IV Piggyback:50] Out: 1600 [Urine:1600]   Intake/Output this shift:  Total I/O In: 350 [P.O.:350] Out: 725 [Urine:725]  No acute confusion  CVS- RRR no murmurs rubs or gallops  RS- CTA  Slightly decreased  At base  ABD- BS present soft non-distended EXT- no edema   Basic Metabolic Panel: Recent Labs  Lab 01/31/18 0315 02/01/18 0313 02/02/18 0418 02/03/18 0427 02/04/18 0359  NA 121* 122* 122* 125* 128*  K 3.4* 4.3 4.0 3.7 3.6  CL 89* 92* 92* 90* 93*  CO2 24 24 24 26 28   GLUCOSE 105* 134* 112* 111* 110*  BUN 21* 20 21* 24* 25*  CREATININE 0.75 0.80 0.74 0.76 0.87  CALCIUM 8.4* 8.1* 8.2* 8.2* 8.2*  MG 1.6* 2.0 1.7 1.6* 2.0  PHOS  --  2.3* 3.0 2.5 2.9    Liver Function Tests: Recent Labs  Lab  01/29/18 1343 02/01/18 0313 02/02/18 0418 02/03/18 0427 02/04/18 0359  AST 31 22 18 18 19   ALT 19 16 17 16 17   ALKPHOS 54 54 51 47 45  BILITOT 0.9 0.6 0.4 0.3 0.7  PROT 7.2 5.7* 5.5* 5.5* 5.9*  ALBUMIN 4.3 3.4* 3.3* 3.2* 3.4*   No results for input(s): LIPASE, AMYLASE in the last 168 hours. No results for input(s): AMMONIA in the last 168 hours.  CBC: Recent Labs  Lab 01/29/18 1343 02/01/18 0313 02/02/18 0418 02/03/18 0427 02/04/18 0359  WBC 8.9 8.0 8.4 8.1 8.6  NEUTROABS 6.9 6.0 6.3 5.9 6.4  HGB 14.1 12.1 11.6* 11.1* 11.9*  HCT 38.1 34.0* 32.4* 31.6* 34.0*  MCV 85.8 88.3 88.3 89.5 90.2  PLT 248 182 184 189 192    Cardiac Enzymes: No results for input(s): CKTOTAL, CKMB, CKMBINDEX, TROPONINI in the last 168 hours.  BNP: Invalid input(s): POCBNP  CBG: Recent Labs  Lab 01/29/18 1323  GLUCAP 147*    Microbiology: Results for orders placed or performed during the hospital encounter of 01/29/18  Culture, blood (Routine x 2)     Status: None   Collection Time: 01/29/18  1:43 PM  Result Value Ref Range Status   Specimen Description  Final    BLOOD RIGHT HAND Performed at [facility], [place] [address] [place], [place] [address]    Special Requests   Final    BOTTLES DRAWN AEROBIC AND ANAEROBIC Blood Culture results may not be optimal due to an inadequate volume of blood received in culture bottles Performed at [place] [address], [place], [place] [address]    Culture   Final    NO GROWTH 5 DAYS Performed at [facility], [place] [address] [place], [place] [address]    Report Status 02/03/2018 FINAL  Final  Culture, blood (Routine x 2)     Status: None   Collection Time: 01/29/18  1:43 PM  Result Value Ref Range Status   Specimen Description   Final    BLOOD LEFT ANTECUBITAL Performed at [place] [address], [place], [place] [address]    Special Requests   Final    BOTTLES  DRAWN AEROBIC ONLY Blood Culture results may not be optimal due to an inadequate volume of blood received in culture bottles Performed at [place] [place] [address], [place], [place] [address]    Culture   Final    NO GROWTH 5 DAYS Performed at [facility], [place] [address] [place], [place] [address]    Report Status 02/03/2018 FINAL  Final  Culture, Urine     Status: None   Collection Time: 01/29/18  6:06 PM  Result Value Ref Range Status   Specimen Description   Final    URINE, CLEAN CATCH Performed at [facility], [place] [address] [place], [place] [address]    Special Requests   Final    NONE Performed at [facility], [place] [address], [place], [place] [address]    Culture   Final    NO GROWTH Performed at [facility], [place] [address], [place], [place] [address]    Report Status 01/31/2018 FINAL  Final  MRSA PCR Screening     Status: None   Collection Time: 01/29/18  6:56 PM  Result Value Ref Range Status   MRSA by PCR NEGATIVE NEGATIVE Final    Comment:        The GeneXpert MRSA Assay (FDA approved for NASAL specimens only), is one component of a comprehensive MRSA colonization surveillance program. It is not intended to diagnose MRSA infection nor to guide or monitor treatment for MRSA infections. Performed at [facility], [place] [address], [place], [place] [address]     Coagulation Studies: No results for input(s): LABPROT, INR in the last 72 hours.  Urinalysis: No results for input(s): COLORURINE, LABSPEC, PHURINE, GLUCOSEU, HGBUR, BILIRUBINUR, KETONESUR, PROTEINUR, UROBILINOGEN, NITRITE, LEUKOCYTESUR in the last 72 hours.  Invalid input(s): APPERANCEUR    Imaging: No results found.   Medications:   . sodium chloride 75 mL/hr at 02/04/18 0130   . apixaban  2.5 mg Oral BID  . demeclocycline  150 mg Oral Q12H  . diltiazem  360 mg Oral Daily  . feeding supplement  (ENSURE ENLIVE)  237 mL Oral Q24H  . metoprolol tartrate  75 mg Oral BID  . multivitamin with minerals  1 tablet Oral Daily  . pantoprazole  40 mg Oral Daily  . rosuvastatin  10 mg Oral Daily  . sodium chloride  2 g Oral TID WC   acetaminophen, diphenhydrAMINE  Assessment/ Plan:    Hyponatremia  Labs initially look like  volume depletion and patient clearly got better with normal saline  This would not be the case with SIADH   There is no evidence of volume overloaded state  No nephrosis no CHF and no cirrhosis   I think that there is more than likely very poor oral solute intake and I would proceed with salt tabs would d/c IV saline now   Repeat urine studies show a pattern of high osm and high sodium this is consistent with SIADH and would continue lasix and salt and  demeclocycline 150 mg bid   Paroxysmal atrial fibrillation now in sinus rhythm  She continues on diltiazem and eliquis   She is ready for discharge today      LOS: 6 Norena Bratton W @TODAY @11 :21 AM

## 2018-02-04 NOTE — Progress Notes (Signed)
PROGRESS NOTE    Donna Ortega  JJO:[id number] DOB: [birth year] DOA: 01/29/2018 PCP: Lanice Shirts, MD   Brief Narrative:  Donna Ortega is a 84 y.o. female with medical history significant of chronic hyponatremia, CAD, three-vessel CABG, GI bleed, diverticulitis, history of A. fib, hypertension and other comorbidites who was admitted with altered mental status.  Patient was brought in by her daughter-in-law who lives with the patient.  According to daughter-in-law patient had altered mental status few days ago to go to urgent care diagnosed with UTI was given antibiotics with results.  She has taken 3 doses of her antibiotic.  Patient had been more and more lethargic over the last few days.  She had to climb 15 steps to get out of the house she was very scared to take her upstairs because of falls. Has been having generalized weakness and was found to have a Na+ of 113 on Admission.   She was Admitted for Metabolic Encephalopathy, Acute on Chronic Hyponatremia, and Atrial Fibrillation. Cardiology consulted and adjusted medications and were considering Cardioversion once she recovers from Hyponatremia but she converted to NSR with First Degree AV Block. Currently she was receiving IVF Hydration with NS at 75 mL/hr and because Na+ was not significantly improving  Nephrology was consulted for further evaluation and recommendations. Nephrology ecommending Fluid Restriction, Salt tablets 2 grams TID, and rechecked Urine Studies. Per my discussion with Dr. Justin Mend will stop IVF today and continue po Lasix 20 mg po Daily, Salt Tablets and Demeclocycline 150 mg po BID indefinitely. She has been deemed medically stable to D/C to SNF however after my discussion with Social Work cannot be discharged as Donna Ortega is pending along with Ship broker.   Assessment & Plan:   Active Problems:   Atrial fibrillation, chronic (HCC)   Hyponatremia  Metabolic Encephalopathy in the setting of Acute on Chronic  Hyponatremia and likely underlying chronic undiagnosed Dementia -Appeared dry on Admission, with reported poor oral intake -Sodium on presentation was 113 and Sodium is improved at 128 -Nephrology recommended changing to po Lasix 20 mg today and was started  -Nephrology also recommending stopping IVF Hydration today  -Urine Studies are being resent and Urine Osmolality 658, and Urine Sodium 93 and likely component of SIADH -Urinalysis Unremarkable and Urine Cx Negative  -Patient remains Intermittently confused and disoriented and likely from Underlying Dementia but has not changed from yesterday's examination and is not worse -Placed on Delirium Precautions; Avoid Sundowning  -Unlikely UTI as she was recently treated for it and Urinalysis and Urine Cx Negative  -Likley has some underlying Dementia as she is forgetful and continues to not know that she is in the hospital  -Nephrology continuing Fluid Restriction Diet (1500 mL) and Salt Tablets 2grams po TID; They have also started Demeclocycline 150 mg po q12h and after my discussion with Dr. Justin Mend recommends to continue it indefinitely  -Discussed the case with Dr. Justin Mend with Nephrology who recommends patient will be okay to discharge after her sodium is above 128 and she is also Clear from a Cardiac Standpoint.   -Currently she is unable to be discharged at this time given lack of insurance authorization and PASARR number  Hyponatremia -See Above  Paroxysmal Atrial Fibrillation with slight RVR; s/p Conversion to NSR Hx of Atrial Tachycardia Hc of SSS s/p PPM -On BB and Anticoagulation with Apixaban -Currently converted to NSR with First Degree AV Block -Cardiology consulted and were consider cardioversion if sodium improved but no need for  it now.  -Medication adjustment per Cardiology and have increased Diltaizem to 360 mg po daily and Increased Metoprolol Tartrate to 75mg  po BID yesterday; They are recommending continuing current regimen  and commending watching closely for any syncope or significant bradycardia especially given her first-degree AV block -C/w with Telemetry   AKI on CKDII, improving  -Likely from dehydration -She was Recently treated for uti with Omnicef,  -Urinalysis on presentation was unimpressive -BUN/Cr 29/1.01 on presentation and now BUN/Cr is 25/0.87 -We will now discontinue gentle IVF Hydration with NS at 75 mL/hr   FTT/Generalized Weakness -Has reported poor oral intake; Consult Nutritionist  -PT/OT evaluated and recommending SNF still and Recommending Rolling Walker with 5" Wheels  -Education officer, museum consulted for assistance with placement and anticipate D/C once insurance authorization has been confirmed and PASARR number has been received   HTN -BP was 153/70 -Continue with Diltiazem 360 mill grams p.o. daily along with Metoprolol Tartrate 75 mg p.o. twice daily -Started on po Furosemide 20 mg po Daily  -Continue to Monitor closely and if necessary add IV Hydralazine prn  HLD -Continue with Rosuvastatin 10 mg p.o. daily  Hypokalemia, improved -Patient's potassium was 3.4 and improved to 36 -Continue to monitor and replete as necessary -Repeat CMP in a.m.  Hypomagnesemia -Patient's Mag Level was 1.6 and improved to 2.0 -Replete with IV mag sulfate 2 g x1 yesterday  -Continue to Monitor and Replete as Necessary -Repeat Mag Level in AM   Hypophosphatemia  -Her phosphorus level this morning is 2.9 -Continue to monitor and replete as necessary -Repeat phosphorus level in a.m.  Recent UTI, s/p treatment -Received 3 Doses of Abx as an outpatient -Urinalysis and Urine Cx unremarkable here and not indicative of UTI -Continue to Monitor for Urinary Symptoms  Hyperglycemia -Patient's Blood Sugars have been ranging from 102-134 on Daily BMP/CMP's -Checking HbA1c; Last HbA1c was 5.6 -Continue to Monitor Blood Sugars and if consistently elevated will consider adding Sensistive Novolog SSI  AC  DVT prophylaxis: Anticoagulated with Apxiaban 2.5 mg po BID  Code Status: DO NOT RESUSCITATE Family Communication: Discussed with Son at Bedside Disposition Plan: Anticipate D/C to SNF when PASARR and Ship broker obtained. Patient is MEDICALLY STABLE to D/C at this time  Consultants:   Cardiology Dr. Meda Coffee  Nephrology Dr. Edrick Oh   Procedures: None   Antimicrobials: Anti-infectives (From admission, onward)   Start     Dose/Rate Route Frequency Ordered Stop   02/03/18 1100  demeclocycline (DECLOMYCIN) tablet 150 mg     150 mg Oral Every 12 hours 02/03/18 1016       Subjective: Patient was seen and examined and she was laying in bed and states she was not doing well because she  did not sleep very much last night.  No chest pain, shortness breath, nausea, vomiting.  Still slightly confused but knew she was in the hospital today.  No other concerns or complaints at this time.  Objective: Vitals:   02/03/18 1335 02/03/18 2038 02/04/18 0431 02/04/18 1306  BP: (!) 141/62 131/62 (!) 153/76 (!) 153/70  Pulse: 70 77 75 74  Resp: 18 (!) 22 14 20   Temp: 98.7 F (37.1 C) 98.9 F (37.2 C) 98.4 F (36.9 C) 98.5 F (36.9 C)  TempSrc: Oral Oral Oral Oral  SpO2: 100% 98% 98% 99%  Weight:      Height:        Intake/Output Summary (Last 24 hours) at 02/04/2018 1330 Last data filed at 02/04/2018 1021 Gross  per 24 hour  Intake 395 ml  Output 1925 ml  Net -1530 ml   Filed Weights   01/29/18 1318 01/29/18 1900  Weight: 59 kg (130 lb) 59.6 kg (131 lb 6.3 oz)   Examination: Physical Exam:  Constitutional: Thin pleasantly demented elderly Caucasian female is currently in no acute distress laying in bed. She remains slightly confused Eyes: Sclera anicteric.  Lids normal ENMT: External ears and nose appear normal.  Grossly normal hearing Neck: Supple with no JVD Respiratory: Diminished to auscultation bilaterally with no appreciable wheezing, rales, rhonchi.  Not  wearing any supplemental oxygen via nasal cannula and does not have any labored breathing Cardiovascular: Regular rate and rhythm.  No appreciable murmurs, rubs, gallops.  No lower extremity edema noted Abdomen: Soft, nontender, nondistended.  Bowel sounds present all 4 quadrants GU: Deferred Musculoskeletal: No contractures or cyanosis.  No joint deformities noted Skin: Skin is warm and dry with no appreciable rashes or lesions on limited skin evaluation Neurologic: Cranial nerves II through XII grossly intact no appreciable focal deficits Psychiatric: Awake and alert and knows she is in the hospital today.  Knows the month and the year along with the president but cannot remember where she lives.  Has a a pleasant normal mood and affect; continues to be pleasantly demented  Data Reviewed: I have personally reviewed following labs and imaging studies  CBC: Recent Labs  Lab 01/29/18 1343 02/01/18 0313 02/02/18 0418 02/03/18 0427 02/04/18 0359  WBC 8.9 8.0 8.4 8.1 8.6  NEUTROABS 6.9 6.0 6.3 5.9 6.4  HGB 14.1 12.1 11.6* 11.1* 11.9*  HCT 38.1 34.0* 32.4* 31.6* 34.0*  MCV 85.8 88.3 88.3 89.5 90.2  PLT 248 182 184 189 789   Basic Metabolic Panel: Recent Labs  Lab 01/31/18 0315 02/01/18 0313 02/02/18 0418 02/03/18 0427 02/04/18 0359  NA 121* 122* 122* 125* 128*  K 3.4* 4.3 4.0 3.7 3.6  CL 89* 92* 92* 90* 93*  CO2 24 24 24 26 28   GLUCOSE 105* 134* 112* 111* 110*  BUN 21* 20 21* 24* 25*  CREATININE 0.75 0.80 0.74 0.76 0.87  CALCIUM 8.4* 8.1* 8.2* 8.2* 8.2*  MG 1.6* 2.0 1.7 1.6* 2.0  PHOS  --  2.3* 3.0 2.5 2.9   GFR: Estimated Creatinine Clearance: 36.3 mL/min (by C-G formula based on SCr of 0.87 mg/dL). Liver Function Tests: Recent Labs  Lab 01/29/18 1343 02/01/18 0313 02/02/18 0418 02/03/18 0427 02/04/18 0359  AST 31 22 18 18 19   ALT 19 16 17 16 17   ALKPHOS 54 54 51 47 45  BILITOT 0.9 0.6 0.4 0.3 0.7  PROT 7.2 5.7* 5.5* 5.5* 5.9*  ALBUMIN 4.3 3.4* 3.3* 3.2* 3.4*     No results for input(s): LIPASE, AMYLASE in the last 168 hours. No results for input(s): AMMONIA in the last 168 hours. Coagulation Profile: Recent Labs  Lab 01/29/18 1343  INR 0.96   Cardiac Enzymes: No results for input(s): CKTOTAL, CKMB, CKMBINDEX, TROPONINI in the last 168 hours. BNP (last 3 results) No results for input(s): PROBNP in the last 8760 hours. HbA1C: No results for input(s): HGBA1C in the last 72 hours. CBG: Recent Labs  Lab 01/29/18 1323  GLUCAP 147*   Lipid Profile: No results for input(s): CHOL, HDL, LDLCALC, TRIG, CHOLHDL, LDLDIRECT in the last 72 hours. Thyroid Function Tests: No results for input(s): TSH, T4TOTAL, FREET4, T3FREE, THYROIDAB in the last 72 hours. Anemia Panel: No results for input(s): VITAMINB12, FOLATE, FERRITIN, TIBC, IRON, RETICCTPCT in the last  72 hours. Sepsis Labs: Recent Labs  Lab 01/29/18 1345  LATICACIDVEN 1.68    Recent Results (from the past 240 hour(s))  Culture, blood (Routine x 2)     Status: None   Collection Time: 01/29/18  1:43 PM  Result Value Ref Range Status   Specimen Description   Final    BLOOD RIGHT HAND Performed at [facility] [address] [place], [place] [address]    Special Requests   Final    BOTTLES DRAWN AEROBIC AND ANAEROBIC Blood Culture results may not be optimal due to an inadequate volume of blood received in culture bottles Performed at [facility] [address] [place], [place] [address]    Culture   Final    NO GROWTH 5 DAYS Performed at [facility], [place] [address], [place], [place] [address]    Report Status 02/03/2018 FINAL  Final  Culture, blood (Routine x 2)     Status: None   Collection Time: 01/29/18  1:43 PM  Result Value Ref Range Status   Specimen Description   Final    BLOOD LEFT ANTECUBITAL Performed at [facility] [address], [place], [place] [address]    Special Requests   Final    BOTTLES  DRAWN AEROBIC ONLY Blood Culture results may not be optimal due to an inadequate volume of blood received in culture bottles Performed at [facility] [address], [place], [place] [address]    Culture   Final    NO GROWTH 5 DAYS Performed at [facility], [place] [address], [place], [place] [address]    Report Status 02/03/2018 FINAL  Final  Culture, Urine     Status: None   Collection Time: 01/29/18  6:06 PM  Result Value Ref Range Status   Specimen Description   Final    URINE, CLEAN CATCH Performed at [facility], [place] [address], [place], [place] [address]    Special Requests   Final    NONE Performed at [facility], [place] [address], [place], [place] [address]    Culture   Final    NO GROWTH Performed at [facility], [place] [address] [place], [place] [address]    Report Status 01/31/2018 FINAL  Final  MRSA PCR Screening     Status: None   Collection Time: 01/29/18  6:56 PM  Result Value Ref Range Status   MRSA by PCR NEGATIVE NEGATIVE Final    Comment:        The GeneXpert MRSA Assay (FDA approved for NASAL specimens only), is one component of a comprehensive MRSA colonization surveillance program. It is not intended to diagnose MRSA infection nor to guide or monitor treatment for MRSA infections. Performed at [facility], [place] [address] [place], [place] [address]     Radiology Studies: No results found. Scheduled Meds: . apixaban  2.5 mg Oral BID  . demeclocycline  150 mg Oral Q12H  . diltiazem  360 mg Oral Daily  . feeding supplement (ENSURE ENLIVE)  237 mL Oral Q24H  . metoprolol tartrate  75 mg Oral BID  . multivitamin with minerals  1 tablet Oral Daily  . pantoprazole  40 mg Oral Daily  . rosuvastatin  10 mg Oral Daily  . sodium chloride  2 g Oral TID WC   Continuous Infusions:   LOS: 6 days   Kerney Elbe, DO [facility] Pager  [phone]  If 7PM-7AM, please contact night-coverage [email or web address] Password [email or web address] 02/04/2018, 1:30 PM

## 2018-02-05 ENCOUNTER — Other Ambulatory Visit: Payer: Self-pay | Admitting: Cardiovascular Disease

## 2018-02-05 LAB — PHOSPHORUS: Phosphorus: 3 mg/dL (ref 2.5–4.6)

## 2018-02-05 LAB — COMPREHENSIVE METABOLIC PANEL
ALBUMIN: 3.5 g/dL (ref 3.5–5.0)
ALT: 20 U/L (ref 14–54)
AST: 20 U/L (ref 15–41)
Alkaline Phosphatase: 51 U/L (ref 38–126)
Anion gap: 8 (ref 5–15)
BILIRUBIN TOTAL: 0.4 mg/dL (ref 0.3–1.2)
BUN: 21 mg/dL — ABNORMAL HIGH (ref 6–20)
CO2: 27 mmol/L (ref 22–32)
Calcium: 8.6 mg/dL — ABNORMAL LOW (ref 8.9–10.3)
Chloride: 89 mmol/L — ABNORMAL LOW (ref 101–111)
Creatinine, Ser: 0.72 mg/dL (ref 0.44–1.00)
GFR calc Af Amer: >60 mL/min (ref >60)
GFR calc non Af Amer: >60 mL/min (ref >60)
GLUCOSE: 116 mg/dL — AB (ref 65–99)
POTASSIUM: 3.6 mmol/L (ref 3.5–5.1)
Sodium: 124 mmol/L — ABNORMAL LOW (ref 135–145)
TOTAL PROTEIN: 6.2 g/dL — AB (ref 6.5–8.1)

## 2018-02-05 LAB — BASIC METABOLIC PANEL
Anion gap: 9 (ref 5–15)
BUN: 25 mg/dL — ABNORMAL HIGH (ref 6–20)
CHLORIDE: 88 mmol/L — AB (ref 101–111)
CO2: 27 mmol/L (ref 22–32)
CREATININE: 0.94 mg/dL (ref 0.44–1.00)
Calcium: 8.8 mg/dL — ABNORMAL LOW (ref 8.9–10.3)
GFR calc non Af Amer: 52 mL/min — ABNORMAL LOW (ref >60)
Glucose, Bld: 189 mg/dL — ABNORMAL HIGH (ref 65–99)
POTASSIUM: 3.4 mmol/L — AB (ref 3.5–5.1)
Sodium: 124 mmol/L — ABNORMAL LOW (ref 135–145)

## 2018-02-05 LAB — CBC WITH DIFFERENTIAL/PLATELET
BASOS PCT: 0 %
Basophils Absolute: 0 10*3/uL (ref 0.0–0.1)
Eosinophils Absolute: 0.1 10*3/uL (ref 0.0–0.7)
Eosinophils Relative: 1 %
HEMATOCRIT: 34.3 % — AB (ref 36.0–46.0)
HEMOGLOBIN: 12.1 g/dL (ref 12.0–15.0)
LYMPHS ABS: 1.3 10*3/uL (ref 0.7–4.0)
Lymphocytes Relative: 14 %
MCH: 31.8 pg (ref 26.0–34.0)
MCHC: 35.3 g/dL (ref 30.0–36.0)
MCV: 90 fL (ref 78.0–100.0)
MONOS PCT: 9 %
Monocytes Absolute: 0.8 10*3/uL (ref 0.1–1.0)
NEUTROS ABS: 6.9 10*3/uL (ref 1.7–7.7)
NEUTROS PCT: 76 %
Platelets: 215 10*3/uL (ref 150–400)
RBC: 3.81 MIL/uL — AB (ref 3.87–5.11)
RDW: 12.6 % (ref 11.5–15.5)
WBC: 9.1 10*3/uL (ref 4.0–10.5)

## 2018-02-05 LAB — MAGNESIUM: Magnesium: 1.7 mg/dL (ref 1.7–2.4)

## 2018-02-05 LAB — HEMOGLOBIN A1C
Hgb A1c MFr Bld: 5.7 % — ABNORMAL HIGH (ref 4.8–5.6)
Mean Plasma Glucose: 116.89 mg/dL

## 2018-02-05 MED ORDER — POTASSIUM CHLORIDE CRYS ER 20 MEQ PO TBCR
40.0000 meq | EXTENDED_RELEASE_TABLET | Freq: Two times a day (BID) | ORAL | Status: DC
  Administered 2018-02-05 – 2018-02-06 (×2): 40 meq via ORAL
  Filled 2018-02-05 (×2): qty 2

## 2018-02-05 MED ORDER — ONDANSETRON HCL 4 MG/2ML IJ SOLN
4.0000 mg | Freq: Four times a day (QID) | INTRAMUSCULAR | Status: DC | PRN
  Administered 2018-02-05: 4 mg via INTRAVENOUS
  Filled 2018-02-05 (×2): qty 2

## 2018-02-05 MED ORDER — ZOLPIDEM TARTRATE 5 MG PO TABS
5.0000 mg | ORAL_TABLET | Freq: Once | ORAL | Status: AC
  Administered 2018-02-05: 5 mg via ORAL
  Filled 2018-02-05: qty 1

## 2018-02-05 NOTE — Progress Notes (Signed)
PROGRESS NOTE    Donna Ortega  PNT:[id number] DOB: 06/22/1926 DOA: 01/29/2018 PCP: Lanice Shirts, MD   Brief Narrative:  Donna Ortega is a 84 y.o. female with medical history significant of chronic hyponatremia, CAD, three-vessel CABG, GI bleed, diverticulitis, history of A. fib, hypertension and other comorbidites who was admitted with altered mental status.  Patient was brought in by her daughter-in-law who lives with the patient.  According to daughter-in-law patient had altered mental status few days ago to go to urgent care diagnosed with UTI was given antibiotics with results.  She has taken 3 doses of her antibiotic.  Patient had been more and more lethargic over the last few days.  She had to climb 15 steps to get out of the house she was very scared to take her upstairs because of falls. Has been having generalized weakness and was found to have a Na+ of 113 on Admission.   She was Admitted for Metabolic Encephalopathy, Acute on Chronic Hyponatremia, and Atrial Fibrillation. Cardiology consulted and adjusted medications and were considering Cardioversion once she recovers from Hyponatremia but she converted to NSR with First Degree AV Block. Currently she was receiving IVF Hydration with NS at 75 mL/hr and because Na+ was not significantly improving  Nephrology was consulted for further evaluation and recommendations. Nephrology ecommending Fluid Restriction, Salt tablets 2 grams TID, and rechecked Urine Studies. Per my discussion with Dr. Justin Mend will stop IVF today and continue po Lasix 20 mg po Daily, Salt Tablets and Demeclocycline 150 mg po BID indefinitely. She had been deemed medically stable to D/C to SNF however her sodium dropped again so will have Nephrology re-evaluate  Assessment & Plan:   Active Problems:   Atrial fibrillation, chronic (HCC)   Hyponatremia  Metabolic Encephalopathy in the setting of Acute on Chronic Hyponatremia and likely underlying chronic undiagnosed  Dementia -Appeared dry on Admission, with reported poor oral intake -Sodium on presentation was 113 and Sodium was improved at 128 but now Dropped to 124 -Nephrology recommended changing to po Lasix 20 mg today and was started  -Nephrology also recommending stopping IVF Hydration yesterday  -Urine Studies are being resent and Urine Osmolality 658, and Urine Sodium 93 and likely component of SIADH -Urinalysis Unremarkable and Urine Cx Negative  -Patient remains Intermittently confused and disoriented and likely from Underlying Dementia but has not changed from yesterday's examination and is not worse -Placed on Delirium Precautions; Avoid Sundowning  -Unlikely UTI as she was recently treated for it and Urinalysis and Urine Cx Negative  -Likley has some underlying Dementia as she is forgetful and continues to not know that she is in the hospital  -Nephrology continuing Fluid Restriction Diet and titrated now from 1500 mL to a Fluid Restriction of [phone] mL and Salt Tablets 2grams po TID; They have also started Demeclocycline 150 mg po q12h and after my discussion with Dr. Justin Mend recommends to continue it indefinitely  -Nephrology felt Patient would be okay to discharge after her sodium is above 128 and she is also Clear from a Cardiac Standpoint.   -Nephrology repeated Sodium and was still 124 this Afternoon -Nephrology recommending repeating labs and discontinuing all gatorade -Currently she is unable to be discharged at this time given lack of insurance authorization and because Sodium fell again   Hyponatremia -See Above  Paroxysmal Atrial Fibrillation with slight RVR; s/p Conversion to NSR Hx of Atrial Tachycardia Hc of SSS s/p PPM -On BB and Anticoagulation with Apixaban -Currently converted to  NSR with First Degree AV Block -Cardiology consulted and were consider cardioversion if sodium improved but no need for it now.  -Medication adjustment per Cardiology and have increased  Diltaizem to 360 mg po daily and Increased Metoprolol Tartrate to 75mg  po BID yesterday; They are recommending continuing current regimen and commending watching closely for any syncope or significant bradycardia especially given her first-degree AV block -C/w with Telemetry for now   AKI on CKDII, improving  -Likely from dehydration -She was Recently treated for uti with Omnicef,  -Urinalysis on presentation was unimpressive -BUN/Cr 29/1.01 on presentation and now BUN/Cr is 25/0.94 -We will now discontinue gentle IVF Hydration with NS at 75 mL/hr   FTT/Generalized Weakness -Has reported poor oral intake; Consult Nutritionist  -PT/OT evaluated and recommending SNF still and Recommending Rolling Walker with 5" Wheels  -Education officer, museum consulted for assistance with placement and anticipate D/C once insurance authorization has been confirmed and PASARR number has been received   HTN -BP was 107/68 this AM  -Continue with Diltiazem 360 mill grams p.o. daily along with Metoprolol Tartrate 75 mg p.o. twice daily -Started on po Furosemide 20 mg po Daily and will continue  -Continue to Monitor closely and if necessary add IV Hydralazine prn  HLD -Continue with Rosuvastatin 10 mg p.o. daily  Hypokalemia, improved -Patient's potassium was 3.4 -Replete with potassium chloride 40 mEq twice daily x2 doses -Continue to monitor and replete as necessary -Repeat CMP in a.m.  Hypomagnesemia -Patient's Mag Level was 1.7 -Continue to Monitor and Replete as Necessary -Repeat Mag Level in AM   Hypophosphatemia  -Her phosphorus level this morning is 2.9 -Continue to monitor and replete as necessary -Repeat phosphorus level in a.m.  Recent UTI, s/p treatment -Received 3 Doses of Abx as an outpatient -Urinalysis and Urine Cx unremarkable here and not indicative of UTI -Continue to Monitor for Urinary Symptoms  Hyperglycemia -Patient's Blood Sugars have been ranging from 102-134 on Daily  BMP/CMP's -Checking HbA1c; Last HbA1c was 5.6 -Continue to Monitor Blood Sugars and if consistently elevated will consider adding Sensistive Novolog SSI AC  DVT prophylaxis: Anticoagulated with Apxiaban 2.5 mg po BID  Code Status: DO NOT RESUSCITATE Family Communication: No family present at bedside  Disposition Plan: Anticipate D/C to SNF when PASARR and Insurance Authorization obtained and Sodium is improved.   Consultants:   Cardiology Dr. Meda Coffee  Nephrology Dr. Hassell Done Webb/Dr. Roney Jaffe   Procedures: None   Antimicrobials: Anti-infectives (From admission, onward)   Start     Dose/Rate Route Frequency Ordered Stop   02/03/18 1100  demeclocycline (DECLOMYCIN) tablet 150 mg     150 mg Oral Every 12 hours 02/03/18 1016       Subjective: Patient was seen and examined and she was laying in bed again and she did not get much sleep last night.  No chest pain, shortness breath, lightheadedness or dizziness but did complain of nausea later this afternoon to the nurse.  No other concerns or complaints at this time  Objective: Vitals:   02/04/18 1306 02/04/18 2029 02/05/18 0353 02/05/18 1358  BP: (!) 153/70 (!) 155/75 (!) 147/81 107/68  Pulse: 74 80 79 74  Resp: 20 18 18 18   Temp: 98.5 F (36.9 C) 98.2 F (36.8 C) 97.9 F (36.6 C) 98.3 F (36.8 C)  TempSrc: Oral Oral Oral Oral  SpO2: 99% 100% 99% 98%  Weight:      Height:        Intake/Output Summary (Last 24 hours)  at 02/05/2018 1945 Last data filed at 02/05/2018 1358 Gross per 24 hour  Intake 480 ml  Output 2700 ml  Net -2220 ml   Filed Weights   01/29/18 1318 01/29/18 1900  Weight: 59 kg (130 lb) 59.6 kg (131 lb 6.3 oz)   Examination: Physical Exam:  Constitutional: Thin pleasantly demented elderly Caucasian female who is currently laying in bed and still remains slightly confused but states she does not feel well. Eyes: Sclera anicteric.  Lids and conjunctive are normal ENMT: External ears and nose appear  normal.  Grossly normal hearing  Neck: Supple with no JVD Respiratory: Diminished to auscultation bilaterally no appreciable wheezing, rales, rhonchi.  She is not wearing any supplemental oxygen via nasal cannula and does not have any labored breathing Cardiovascular: Regular rate and rhythm.  No appreciable murmurs, rubs, gallops.  No lower extremity edema noted. Abdomen: Soft, nontender, nondistended.  Bowel sounds present all 4 quadrants GU: Deferred Musculoskeletal: No contractures cyanosis.  No joint deformities noted Skin: Skin is warm and dry with no appreciable rashes lesions limited skin evaluation Neurologic: Cranial nerves II through XII grossly intact no appreciable focal deficits Psychiatric: She is awake and alert but states she does not feel well.  Has a depressed appearing mood but a normal affect.  Data Reviewed: I have personally reviewed following labs and imaging studies  CBC: Recent Labs  Lab 02/01/18 0313 02/02/18 0418 02/03/18 0427 02/04/18 0359 02/05/18 0422  WBC 8.0 8.4 8.1 8.6 9.1  NEUTROABS 6.0 6.3 5.9 6.4 6.9  HGB 12.1 11.6* 11.1* 11.9* 12.1  HCT 34.0* 32.4* 31.6* 34.0* 34.3*  MCV 88.3 88.3 89.5 90.2 90.0  PLT 182 184 189 192 427   Basic Metabolic Panel: Recent Labs  Lab 02/01/18 0313 02/02/18 0418 02/03/18 0427 02/04/18 0359 02/05/18 0422 02/05/18 1648  NA 122* 122* 125* 128* 124* 124*  K 4.3 4.0 3.7 3.6 3.6 3.4*  CL 92* 92* 90* 93* 89* 88*  CO2 24 24 26 28 27 27   GLUCOSE 134* 112* 111* 110* 116* 189*  BUN 20 21* 24* 25* 21* 25*  CREATININE 0.80 0.74 0.76 0.87 0.72 0.94  CALCIUM 8.1* 8.2* 8.2* 8.2* 8.6* 8.8*  MG 2.0 1.7 1.6* 2.0 1.7  --   PHOS 2.3* 3.0 2.5 2.9 3.0  --    GFR: Estimated Creatinine Clearance: 33.6 mL/min (by C-G formula based on SCr of 0.94 mg/dL). Liver Function Tests: Recent Labs  Lab 02/01/18 0313 02/02/18 0418 02/03/18 0427 02/04/18 0359 02/05/18 0422  AST 22 18 18 19 20   ALT 16 17 16 17 20   ALKPHOS 54 51 47  45 51  BILITOT 0.6 0.4 0.3 0.7 0.4  PROT 5.7* 5.5* 5.5* 5.9* 6.2*  ALBUMIN 3.4* 3.3* 3.2* 3.4* 3.5   No results for input(s): LIPASE, AMYLASE in the last 168 hours. No results for input(s): AMMONIA in the last 168 hours. Coagulation Profile: No results for input(s): INR, PROTIME in the last 168 hours. Cardiac Enzymes: No results for input(s): CKTOTAL, CKMB, CKMBINDEX, TROPONINI in the last 168 hours. BNP (last 3 results) No results for input(s): PROBNP in the last 8760 hours. HbA1C: Recent Labs    02/05/18 0422  HGBA1C 5.7*   CBG: No results for input(s): GLUCAP in the last 168 hours. Lipid Profile: No results for input(s): CHOL, HDL, LDLCALC, TRIG, CHOLHDL, LDLDIRECT in the last 72 hours. Thyroid Function Tests: No results for input(s): TSH, T4TOTAL, FREET4, T3FREE, THYROIDAB in the last 72 hours. Anemia Panel: No results  for input(s): VITAMINB12, FOLATE, FERRITIN, TIBC, IRON, RETICCTPCT in the last 72 hours. Sepsis Labs: No results for input(s): PROCALCITON, LATICACIDVEN in the last 168 hours.  Recent Results (from the past 240 hour(s))  Culture, blood (Routine x 2)     Status: None   Collection Time: 01/29/18  1:43 PM  Result Value Ref Range Status   Specimen Description   Final    BLOOD RIGHT HAND Performed at [place] [address] [place], [place] [address]    Special Requests   Final    BOTTLES DRAWN AEROBIC AND ANAEROBIC Blood Culture results may not be optimal due to an inadequate volume of blood received in culture bottles Performed at [place] [address], [place], [place] [address]    Culture   Final    NO GROWTH 5 DAYS Performed at [facility], [place] [address], [place], [place] [address]    Report Status 02/03/2018 FINAL  Final  Culture, blood (Routine x 2)     Status: None   Collection Time: 01/29/18  1:43 PM  Result Value Ref Range Status   Specimen Description   Final    BLOOD LEFT  ANTECUBITAL Performed at [place] [address] [place], [place] [address]    Special Requests   Final    BOTTLES DRAWN AEROBIC ONLY Blood Culture results may not be optimal due to an inadequate volume of blood received in culture bottles Performed at [place] [address] [place], [place] [address]    Culture   Final    NO GROWTH 5 DAYS Performed at [facility], [place] [address] [place], [place] [address]    Report Status 02/03/2018 FINAL  Final  Culture, Urine     Status: None   Collection Time: 01/29/18  6:06 PM  Result Value Ref Range Status   Specimen Description   Final    URINE, CLEAN CATCH Performed at [facility], [place] [address], [place], [place] [address]    Special Requests   Final    NONE Performed at [facility], [place] [address] [place], [place] [address]    Culture   Final    NO GROWTH Performed at [facility], [place] [address], [place], [place] [address]    Report Status 01/31/2018 FINAL  Final  MRSA PCR Screening     Status: None   Collection Time: 01/29/18  6:56 PM  Result Value Ref Range Status   MRSA by PCR NEGATIVE NEGATIVE Final    Comment:        The GeneXpert MRSA Assay (FDA approved for NASAL specimens only), is one component of a comprehensive MRSA colonization surveillance program. It is not intended to diagnose MRSA infection nor to guide or monitor treatment for MRSA infections. Performed at [facility], [place] [address], [place], [place] [address]     Radiology Studies: No results found. Scheduled Meds: . apixaban  2.5 mg Oral BID  . demeclocycline  150 mg Oral Q12H  . diltiazem  360 mg Oral Daily  . feeding supplement (ENSURE ENLIVE)  237 mL Oral Q24H  . furosemide  20 mg Oral Daily  . metoprolol tartrate  75 mg Oral BID  . multivitamin with minerals  1 tablet Oral Daily  . pantoprazole  40 mg Oral Daily  .  rosuvastatin  10 mg Oral Daily  . sodium chloride  2  g Oral TID WC   Continuous Infusions:   LOS: 7 days   Kerney Elbe, DO [facility] Pager [phone]  If 7PM-7AM, please contact night-coverage [email or web address] Password [facility] 02/05/2018, 7:45 PM

## 2018-02-05 NOTE — Progress Notes (Signed)
Physical Therapy Treatment Patient Details Name: Donna Ortega MRN: [id number] DOB: [birth year] Today's Date: 02/05/2018    History of Present Illness 84 y.o. female with medical history significant of chronic hyponatremia, CAD, three-vessel CABG, GI bleed, diverticulitis, history of A. fib, hypertension admitted with altered mental status on 01/29/18, found to have sodium 113, recent  urgent care visit forUTI.    PT Comments    Progressing with mobility. Pt c/o not feeling well on today so she was agreeable to ambulate a short distance. Mobility seems to have improved a bit since last session. Intermittent assist given for safe ambulation. She walked ~40 feet with a RW. Will continue to follow and progress activity as tolerated. Continue to recommend SNF unless pt will have significant assistance available at home.     Follow Up Recommendations  SNF     Equipment Recommendations  Rolling walker with 5" wheels    Recommendations for Other Services       Precautions / Restrictions Precautions Precautions: Fall Precaution Comments: monitor BP Restrictions Weight Bearing Restrictions: No    Mobility  Bed Mobility Overal bed mobility: Modified Independent Bed Mobility: Supine to Sit;Sit to Supine              Transfers Overall transfer level: Needs assistance Equipment used: Rolling walker (2 wheeled) Transfers: Sit to/from Stand Sit to Stand: Min guard         General transfer comment: close guard for safety. VCs hand placement  Ambulation/Gait Ambulation/Gait assistance: Min assist Gait Distance (Feet): 40 Feet Assistive device: Rolling walker (2 wheeled) Gait Pattern/deviations: Step-through pattern     General Gait Details: Intermittent assist to stabilize and maneuver with walker (pt is not familiar with using a walker). She tolerated distance well.    Stairs             Wheelchair Mobility    Modified Rankin (Stroke Patients Only)        Balance Overall balance assessment: Mild deficits observed, not formally tested                                          Cognition Arousal/Alertness: Awake/alert Behavior During Therapy: WFL for tasks assessed/performed Overall Cognitive Status: Within Functional Limits for tasks assessed                                 General Comments: appeared [facility]      Exercises      General Comments        Pertinent Vitals/Pain Pain Assessment: No/denies pain    Home Living                      Prior Function            PT Goals (current goals can now be found in the care plan section) Progress towards PT goals: Progressing toward goals    Frequency    Min 3X/week      PT Plan Current plan remains appropriate    Co-evaluation              AM-PAC PT "6 Clicks" Daily Activity  Outcome Measure  Difficulty turning over in bed (including adjusting bedclothes, sheets and blankets)?: None Difficulty moving from lying on back to sitting on the side of the bed? :  None Difficulty sitting down on and standing up from a chair with arms (e.g., wheelchair, bedside commode, etc,.)?: A Little Help needed moving to and from a bed to chair (including a wheelchair)?: A Little Help needed walking in hospital room?: A Little Help needed climbing 3-5 steps with a railing? : A Little 6 Click Score: 20    End of Session Equipment Utilized During Treatment: Gait belt Activity Tolerance: Patient limited by fatigue Patient left: in bed;with call bell/phone within reach;with bed alarm set   PT Visit Diagnosis: Unsteadiness on feet (R26.81);Muscle weakness (generalized) (M62.81)     Time: 6244-6950 PT Time Calculation (min) (ACUTE ONLY): 8 min  Charges:  $Gait Training: 8-22 mins                    G Codes:          [place]on Anna, MPT Pager: [phone]

## 2018-02-05 NOTE — Progress Notes (Signed)
[facility] ROUNDING NOTE   Subjective:     84 y.o.femalewith medical history significant ofchronic hyponatremia, CAD, three-vessel CABG, GI bleed, diverticulitis, history of A. fib, hypertension  She was admitted form home with altered mental status and increased lethargy and falls   It was noted that her sodium was 113   This responded to IV saline and Na improved to 128.  but then Na fell to 124 today.    Paroxysmal AFib converted to sinus rhythm  BP acceptable for now  Afebrile with great urine out put with lasix        Objective:  Vital signs in last 24 hours:  Temp:  [97.9 F (36.6 C)-98.3 F (36.8 C)] 98.3 F (36.8 C) (06/24 1358) Pulse Rate:  [74-80] 74 (06/24 1358) Resp:  [18] 18 (06/24 1358) BP: (107-155)/(68-81) 107/68 (06/24 1358) SpO2:  [98 %-100 %] 98 % (06/24 1358)  Weight change:  Filed Weights   01/29/18 1318 01/29/18 1900  Weight: 59 kg (130 lb) 59.6 kg (131 lb 6.3 oz)    Intake/Output: I/O last 3 completed shifts: In: 850 [P.O.:850] Out: 2825 [Urine:2825]   Intake/Output this shift:  Total I/O In: 480 [P.O.:480] Out: 925 [Urine:925]  No acute confusion  CVS- RRR no murmurs rubs or gallops  RS- CTA  Slightly decreased  At base  ABD- BS present soft non-distended EXT- no edema   Basic Metabolic Panel: Recent Labs  Lab 02/01/18 0313 02/02/18 0418 02/03/18 0427 02/04/18 0359 02/05/18 0422  NA 122* 122* 125* 128* 124*  K 4.3 4.0 3.7 3.6 3.6  CL 92* 92* 90* 93* 89*  CO2 24 24 26 28 27   GLUCOSE 134* 112* 111* 110* 116*  BUN 20 21* 24* 25* 21*  CREATININE 0.80 0.74 0.76 0.87 0.72  CALCIUM 8.1* 8.2* 8.2* 8.2* 8.6*  MG 2.0 1.7 1.6* 2.0 1.7  PHOS 2.3* 3.0 2.5 2.9 3.0    Liver Function Tests: Recent Labs  Lab 02/01/18 0313 02/02/18 0418 02/03/18 0427 02/04/18 0359 02/05/18 0422  AST 22 18 18 19 20   ALT 16 17 16 17 20   ALKPHOS 54 51 47 45 51  BILITOT 0.6 0.4 0.3 0.7 0.4  PROT 5.7* 5.5* 5.5* 5.9* 6.2*  ALBUMIN  3.4* 3.3* 3.2* 3.4* 3.5   No results for input(s): LIPASE, AMYLASE in the last 168 hours. No results for input(s): AMMONIA in the last 168 hours.  CBC: Recent Labs  Lab 02/01/18 0313 02/02/18 0418 02/03/18 0427 02/04/18 0359 02/05/18 0422  WBC 8.0 8.4 8.1 8.6 9.1  NEUTROABS 6.0 6.3 5.9 6.4 6.9  HGB 12.1 11.6* 11.1* 11.9* 12.1  HCT 34.0* 32.4* 31.6* 34.0* 34.3*  MCV 88.3 88.3 89.5 90.2 90.0  PLT 182 184 189 192 215    Cardiac Enzymes: No results for input(s): CKTOTAL, CKMB, CKMBINDEX, TROPONINI in the last 168 hours.  BNP: Invalid input(s): POCBNP  CBG: No results for input(s): GLUCAP in the last 168 hours.  Microbiology: Results for orders placed or performed during the hospital encounter of 01/29/18  Culture, blood (Routine x 2)     Status: None   Collection Time: 01/29/18  1:43 PM  Result Value Ref Range Status   Specimen Description   Final    BLOOD RIGHT HAND Performed at [facility], [place] [address], [place], [place] [address]    Special Requests   Final    BOTTLES DRAWN AEROBIC AND ANAEROBIC Blood Culture results may not be optimal due to an inadequate  volume of blood received in culture bottles Performed at [place] [address] [place], [place] [address]    Culture   Final    NO GROWTH 5 DAYS Performed at [facility], [place] [address], [place], [place] [address]    Report Status 02/03/2018 FINAL  Final  Culture, blood (Routine x 2)     Status: None   Collection Time: 01/29/18  1:43 PM  Result Value Ref Range Status   Specimen Description   Final    BLOOD LEFT ANTECUBITAL Performed at [place] [address], [place], [place] [address]    Special Requests   Final    BOTTLES DRAWN AEROBIC ONLY Blood Culture results may not be optimal due to an inadequate volume of blood received in culture bottles Performed at [place] [address] [place], [place]  [address]    Culture   Final    NO GROWTH 5 DAYS Performed at [facility], [place] [address] [place], [place] [address]    Report Status 02/03/2018 FINAL  Final  Culture, Urine     Status: None   Collection Time: 01/29/18  6:06 PM  Result Value Ref Range Status   Specimen Description   Final    URINE, CLEAN CATCH Performed at [facility], [place] [address], [place], [place] [address]    Special Requests   Final    NONE Performed at [facility], [place] [address] [place], [place] [address]    Culture   Final    NO GROWTH Performed at [facility], [place] [address] [place], [place] [address]    Report Status 01/31/2018 FINAL  Final  MRSA PCR Screening     Status: None   Collection Time: 01/29/18  6:56 PM  Result Value Ref Range Status   MRSA by PCR NEGATIVE NEGATIVE Final    Comment:        The GeneXpert MRSA Assay (FDA approved for NASAL specimens only), is one component of a comprehensive MRSA colonization surveillance program. It is not intended to diagnose MRSA infection nor to guide or monitor treatment for MRSA infections. Performed at [facility], [place] [address] [place],  [address]     Coagulation Studies: No results for input(s): LABPROT, INR in the last 72 hours.  Urinalysis: No results for input(s): COLORURINE, LABSPEC, PHURINE, GLUCOSEU, HGBUR, BILIRUBINUR, KETONESUR, PROTEINUR, UROBILINOGEN, NITRITE, LEUKOCYTESUR in the last 72 hours.  Invalid input(s): APPERANCEUR    Imaging: No results found.   Medications:    . apixaban  2.5 mg Oral BID  . demeclocycline  150 mg Oral Q12H  . diltiazem  360 mg Oral Daily  . feeding supplement (ENSURE ENLIVE)  237 mL Oral Q24H  . furosemide  20 mg Oral Daily  . metoprolol tartrate  75 mg Oral BID  . multivitamin with minerals  1 tablet Oral Daily  . pantoprazole  40 mg Oral Daily  . rosuvastatin  10 mg Oral Daily  . sodium chloride  2 g  Oral TID WC   acetaminophen, diphenhydrAMINE, ondansetron (ZOFRAN) IV  Assessment/ Plan:    Hyponatremia: initially felt hypovolemic   Labs initially look like volume depletion and patient clearly got better with normal saline.  There is no evidence of volume overloaded state  No nephrosis no CHF and no cirrhosis.   Suspect there is more than likely very poor oral solute intake and  would continue on salt tabs/ low dose po lasix/ demeclo and fluid restriction. I have tightened fluid restriction (all fluids) and dc'd Gatorade recommendation.   Paroxysmal atrial fibrillation now in sinus rhythm  She continues on diltiazem and eliquis   Will repeat labs in am    Kelly Splinter MD [employer] pgr [phone]   02/05/2018, 2:59 PM

## 2018-02-05 NOTE — Care Management Important Message (Signed)
Important Message  Patient Details  Name: LILINOE ACKLIN MRN: [id number] Date of Birth: December 03, 1925   Medicare Important Message Given:  Yes    Kerin Salen 02/05/2018, 12:04 [place] Message  Patient Details  Name: JONICE CERRA MRN: [id number] Date of Birth: Jul 10, 1926   Medicare Important Message Given:  Yes    Kerin Salen 02/05/2018, 12:03 PM

## 2018-02-05 NOTE — Progress Notes (Signed)
CSW following to assist with discharge planning to SNF.  CSW contacted [facility] and confirmed patient's PASRR [id number].  CSW followed up with patient's son Donna Ortega [phone]) who confirmed plan for patient to dc to [facility] for [place] rehab. CSW and patient's son discussed insurance authorization, patient's son reported that they are unable to private pay for SNF.   CSW followed up with [facility] staff confirmed bed offer and agreed to start insurance authorization.   CSW will continue to follow and assist with discharge planning.  Abundio Miu, [place] Social Worker [facility] Cell#: [phone]

## 2018-02-06 LAB — COMPREHENSIVE METABOLIC PANEL
ALK PHOS: 59 U/L (ref 38–126)
ALT: 22 U/L (ref 0–44)
AST: 22 U/L (ref 15–41)
Albumin: 3.9 g/dL (ref 3.5–5.0)
Anion gap: 10 (ref 5–15)
BILIRUBIN TOTAL: 0.5 mg/dL (ref 0.3–1.2)
BUN: 23 mg/dL (ref 8–23)
CALCIUM: 9.1 mg/dL (ref 8.9–10.3)
CHLORIDE: 89 mmol/L — AB (ref 98–111)
CO2: 25 mmol/L (ref 22–32)
CREATININE: 0.71 mg/dL (ref 0.44–1.00)
Glucose, Bld: 109 mg/dL — ABNORMAL HIGH (ref 70–99)
Potassium: 4.5 mmol/L (ref 3.5–5.1)
Sodium: 124 mmol/L — ABNORMAL LOW (ref 135–145)
TOTAL PROTEIN: 6.9 g/dL (ref 6.5–8.1)

## 2018-02-06 LAB — CBC WITH DIFFERENTIAL/PLATELET
Basophils Absolute: 0 10*3/uL (ref 0.0–0.1)
Basophils Relative: 0 %
EOS PCT: 1 %
Eosinophils Absolute: 0.1 10*3/uL (ref 0.0–0.7)
HEMATOCRIT: 38.6 % (ref 36.0–46.0)
Hemoglobin: 13.9 g/dL (ref 12.0–15.0)
LYMPHS ABS: 1.2 10*3/uL (ref 0.7–4.0)
LYMPHS PCT: 11 %
MCH: 32.4 pg (ref 26.0–34.0)
MCHC: 36 g/dL (ref 30.0–36.0)
MCV: 90 fL (ref 78.0–100.0)
Monocytes Absolute: 1 10*3/uL (ref 0.1–1.0)
Monocytes Relative: 9 %
NEUTROS ABS: 8.5 10*3/uL — AB (ref 1.7–7.7)
Neutrophils Relative %: 79 %
PLATELETS: 243 10*3/uL (ref 150–400)
RBC: 4.29 MIL/uL (ref 3.87–5.11)
RDW: 12.7 % (ref 11.5–15.5)
WBC: 10.7 10*3/uL — AB (ref 4.0–10.5)

## 2018-02-06 LAB — PHOSPHORUS: PHOSPHORUS: 3.1 mg/dL (ref 2.5–4.6)

## 2018-02-06 LAB — SODIUM: Sodium: 129 mmol/L — ABNORMAL LOW (ref 135–145)

## 2018-02-06 LAB — MAGNESIUM: MAGNESIUM: 1.7 mg/dL (ref 1.7–2.4)

## 2018-02-06 MED ORDER — TOLVAPTAN 15 MG PO TABS
15.0000 mg | ORAL_TABLET | ORAL | Status: DC
  Administered 2018-02-06: 15 mg via ORAL
  Filled 2018-02-06 (×3): qty 1

## 2018-02-06 NOTE — Progress Notes (Signed)
PROGRESS NOTE    VIOLETA LECOUNT  BJY:[id number] DOB: 1926/07/25 DOA: 01/29/2018 PCP: Lanice Shirts, MD   Brief Narrative:  AURIELLA WIEAND is a 84 y.o. female with medical history significant of chronic hyponatremia, CAD, three-vessel CABG, GI bleed, diverticulitis, history of A. fib, hypertension and other comorbidites who was admitted with altered mental status.  Patient was brought in by her daughter-in-law who lives with the patient.  According to daughter-in-law patient had altered mental status few days ago to go to urgent care diagnosed with UTI was given antibiotics with results.  She has taken 3 doses of her antibiotic.  Patient had been more and more lethargic over the last few days.  She had to climb 15 steps to get out of the house she was very scared to take her upstairs because of falls. Has been having generalized weakness and was found to have a Na+ of 113 on Admission.   She was Admitted for Metabolic Encephalopathy, Acute on Chronic Hyponatremia, and Atrial Fibrillation. Cardiology consulted and adjusted medications and were considering Cardioversion once she recovers from Hyponatremia but she converted to NSR with First Degree AV Block. Currently she was receiving IVF Hydration with NS at 75 mL/hr and because Na+ was not significantly improving  Nephrology was consulted for further evaluation and recommendations. Nephrology ecommending Fluid Restriction, Salt tablets 2 grams TID, and rechecked Urine Studies. Per my discussion with Dr. Justin Mend will stop IVF today and continue po Lasix 20 mg po Daily, Salt Tablets and Demeclocycline 150 mg po BID indefinitely. She had been deemed medically stable to D/C to SNF however her sodium dropped again and is remaining low so Nephrology was re-consulted and are now Trying Tolvaptan x1 today in addition to Lasix, Salt Tabs, Demeclocycline .   Assessment & Plan:   Active Problems:   Atrial fibrillation, chronic (HCC)   Hyponatremia  Metabolic  Encephalopathy in the setting of Acute on Chronic Hyponatremia and likely underlying chronic undiagnosed Dementia -Appeared dry on Admission, with reported poor oral intake -Sodium on presentation was 113 and Sodium was improved at 128 but now Dropped to 124 and remains 37  -Nephrology recommended changing to po Lasix 20 mg today and was started  -Nephrology stopped IVF Hydration  -Urine Studies are being resent and Urine Osmolality 658, and Urine Sodium 93 and likely component of SIADH -Urinalysis Unremarkable and Urine Cx Negative  -Patient remains Intermittently confused and disoriented and likely from Underlying Dementia but has not changed from yesterday's examination and is not worse -Placed on Delirium Precautions; Avoid Sundowning  -Unlikely UTI as she was recently treated for it and Urinalysis and Urine Cx Negative  -Likley has some underlying Dementia as she is forgetful and continues to not know that she is in the hospital  -Nephrology continuing Salt Tablets 2grams po TID; They have also started Demeclocycline 150 mg po q12h and after my discussion with Dr. Justin Mend recommends to continue it indefinitely  -Nephrology felt Patient would be okay to discharge after her sodium is above 128 and she is also Clear from a Cardiac Standpoint.   -Nephrology repeated Sodium and was still 124 this Afternoon and this AM -Nephrology recommending repeating labs and discontinuing all gatorade -Currently she is unable to be discharged at this time given lack of insurance authorization and because Sodium remaining low -Nephrology starting Tolvaptan x1 and recommending no Fluid Restriction while taking Tolvaptan -Need to watch Sodium carefully given interaction of Tolvaptan and Diltiazem causing increased concentrations of Tolvaptan  Hyponatremia -See Above  Paroxysmal Atrial Fibrillation with slight RVR; s/p Conversion to NSR Hx of Atrial Tachycardia Hc of SSS s/p PPM -On BB and Anticoagulation  with Apixaban -Currently converted to NSR with First Degree AV Block and continues to be in Sinus Rhythm  -Cardiology consulted and were consider cardioversion if sodium improved but no need for it now.  -Medication adjustment per Cardiology and have increased Diltaizem to 360 mg po daily and Increased Metoprolol Tartrate to 75mg  po BID yesterday; They are recommending continuing current regimen and commending watching closely for any syncope or significant bradycardia especially given her first-degree AV block -C/w with Telemetry for now   AKI on CKDII, improving  -Likely from dehydration -She was Recently treated for uti with Omnicef,  -Urinalysis on presentation was unimpressive -BUN/Cr 29/1.01 on presentation and now BUN/Cr is 23/0.71 -Discontinued gentle IVF Hydration with NS at 75 mL/hr   FTT/Generalized Weakness -Has reported poor oral intake; Consult Nutritionist  -PT/OT evaluated and recommending SNF still and Recommending Rolling Walker with 5" Wheels  -Education officer, museum consulted for assistance with placement and anticipate D/C once insurance authorization has been confirmed and PASARR number has been received and Sodium has improved   HTN -BP was 106/66 this AM  -Continue with Diltiazem 360 mill grams p.o. daily along with Metoprolol Tartrate 75 mg p.o. twice daily -Started on po Furosemide 20 mg po Daily and will continue  -Continue to Monitor closely and if necessary add IV Hydralazine prn  HLD -Continue with Rosuvastatin 10 mg p.o. daily  Hypokalemia, improved -Patient's potassium was now 4.5 -Will stop po Potassium Supplementation -Continue to monitor and replete as necessary -Repeat CMP in a.m.  Hypomagnesemia -Patient's Mag Level was 1.7 -Continue to Monitor and Replete as Necessary -Repeat Mag Level in AM   Hypophosphatemia  -Her phosphorus level this morning is 3.1 -Continue to monitor and replete as necessary -Repeat phosphorus level in a.m.  Recent UTI,  s/p treatment -Received 3 Doses of Abx as an outpatient -Urinalysis and Urine Cx unremarkable here and not indicative of UTI -Continue to Monitor for Urinary Symptoms  Hyperglycemia in the setting of Pre-Diabetes/IFG -Patient's Blood Sugars have been ranging from 102-134 on Daily BMP/CMP's -Checking HbA1c and was 5.7; Last HbA1c was 5.6 -Continue to Monitor Blood Sugars and if consistently elevated will consider adding Sensistive Novolog SSI AC  DVT prophylaxis: Anticoagulated with Apxiaban 2.5 mg po BID  Code Status: DO NOT RESUSCITATE Family Communication: No family present at bedside  Disposition Plan: Anticipate D/C to SNF when PASARR and Insurance Authorization obtained and Sodium is improved.  Consultants:   Cardiology Dr. Meda Coffee  Nephrology Dr. Hassell Done Webb/Dr. Roney Jaffe   Procedures: None   Antimicrobials: Anti-infectives (From admission, onward)   Start     Dose/Rate Route Frequency Ordered Stop   02/03/18 1100  demeclocycline (DECLOMYCIN) tablet 150 mg     150 mg Oral Every 12 hours 02/03/18 1016       Subjective: Patient was seen and examined and stated she was cold but had just ambulated around the unit. Still slightly confused but knows she is in the hospital. States she was feeling good today. Denied any CP, SOB, Nausea or Vomiting. No other concerns or complaints at this time.   Objective: Vitals:   02/05/18 1358 02/05/18 2108 02/06/18 0458 02/06/18 1351  BP: 107/68 (!) 151/78 (!) 157/85 106/66  Pulse: 74 84 87 94  Resp: 18 12 20 17   Temp: 98.3 F (36.8 C) 99.1 F (  37.3 C) 99.1 F (37.3 C) 98.7 F (37.1 C)  TempSrc: Oral Oral Oral   SpO2: 98% 97% 100% 97%  Weight:      Height:        Intake/Output Summary (Last 24 hours) at 02/06/2018 1652 Last data filed at 02/06/2018 0749 Gross per 24 hour  Intake 60 ml  Output 925 ml  Net -865 ml   Filed Weights   01/29/18 1318 01/29/18 1900  Weight: 59 kg (130 lb) 59.6 kg (131 lb 6.3 oz)    Examination: Physical Exam:  Constitutional: Thin pleasantly demented elderly Caucasian female is currently in no acute distress is feeling better today and not as confused but is still somewhat Eyes: Sclera anicteric.  Lids and conjunctive are normal ENMT: External ears and nose appear normal.  Grossly normal hearing Neck: Appears supple with no JVD Respiratory: Diminished to auscultation bilaterally no appreciable wheezing, rales, rhonchi.  She is not wearing any supplemental oxygen via nasal cannula and is not have any labored breathing Cardiovascular: Regular rate and rhythm.  No appreciable murmurs, rubs, gallops.  No lower extremity edema noted Abdomen: Soft, nontender, nondistended.  Bowel sounds present all 4 quadrants GU: Deferred Musculoskeletal: No contractures or cyanosis.  No joint deformities Skin: Skin is warm and dry with no appreciable rashes or lesions on limited skin evaluation Neurologic: Cranial nerves II through XII grossly intact no appreciable focal deficits Psychiatric: Awake and alert and feels better today.  Has a depressed appearing mood normal affect.  Still slightly confused  Data Reviewed: I have personally reviewed following labs and imaging studies  CBC: Recent Labs  Lab 02/02/18 0418 02/03/18 0427 02/04/18 0359 02/05/18 0422 02/06/18 0448  WBC 8.4 8.1 8.6 9.1 10.7*  NEUTROABS 6.3 5.9 6.4 6.9 8.5*  HGB 11.6* 11.1* 11.9* 12.1 13.9  HCT 32.4* 31.6* 34.0* 34.3* 38.6  MCV 88.3 89.5 90.2 90.0 90.0  PLT 184 189 192 215 096   Basic Metabolic Panel: Recent Labs  Lab 02/02/18 0418 02/03/18 0427 02/04/18 0359 02/05/18 0422 02/05/18 1648 02/06/18 0448  NA 122* 125* 128* 124* 124* 124*  K 4.0 3.7 3.6 3.6 3.4* 4.5  CL 92* 90* 93* 89* 88* 89*  CO2 24 26 28 27 27 25   GLUCOSE 112* 111* 110* 116* 189* 109*  BUN 21* 24* 25* 21* 25* 23  CREATININE 0.74 0.76 0.87 0.72 0.94 0.71  CALCIUM 8.2* 8.2* 8.2* 8.6* 8.8* 9.1  MG 1.7 1.6* 2.0 1.7  --  1.7   PHOS 3.0 2.5 2.9 3.0  --  3.1   GFR: Estimated Creatinine Clearance: 39.4 mL/min (by C-G formula based on SCr of 0.71 mg/dL). Liver Function Tests: Recent Labs  Lab 02/02/18 0418 02/03/18 0427 02/04/18 0359 02/05/18 0422 02/06/18 0448  AST 18 18 19 20 22   ALT 17 16 17 20 22   ALKPHOS 51 47 45 51 59  BILITOT 0.4 0.3 0.7 0.4 0.5  PROT 5.5* 5.5* 5.9* 6.2* 6.9  ALBUMIN 3.3* 3.2* 3.4* 3.5 3.9   No results for input(s): LIPASE, AMYLASE in the last 168 hours. No results for input(s): AMMONIA in the last 168 hours. Coagulation Profile: No results for input(s): INR, PROTIME in the last 168 hours. Cardiac Enzymes: No results for input(s): CKTOTAL, CKMB, CKMBINDEX, TROPONINI in the last 168 hours. BNP (last 3 results) No results for input(s): PROBNP in the last 8760 hours. HbA1C: Recent Labs    02/05/18 0422  HGBA1C 5.7*   CBG: No results for input(s): GLUCAP in the  last 168 hours. Lipid Profile: No results for input(s): CHOL, HDL, LDLCALC, TRIG, CHOLHDL, LDLDIRECT in the last 72 hours. Thyroid Function Tests: No results for input(s): TSH, T4TOTAL, FREET4, T3FREE, THYROIDAB in the last 72 hours. Anemia Panel: No results for input(s): VITAMINB12, FOLATE, FERRITIN, TIBC, IRON, RETICCTPCT in the last 72 hours. Sepsis Labs: No results for input(s): PROCALCITON, LATICACIDVEN in the last 168 hours.  Recent Results (from the past 240 hour(s))  Culture, blood (Routine x 2)     Status: None   Collection Time: 01/29/18  1:43 PM  Result Value Ref Range Status   Specimen Description   Final    BLOOD RIGHT HAND Performed at [facility] [address], [place], [place] [address]    Special Requests   Final    BOTTLES DRAWN AEROBIC AND ANAEROBIC Blood Culture results may not be optimal due to an inadequate volume of blood received in culture bottles Performed at [facility] [address], [place], [place] [address]    Culture   Final    NO  GROWTH 5 DAYS Performed at [facility], [place] [address] [place], [place] [address]    Report Status 02/03/2018 FINAL  Final  Culture, blood (Routine x 2)     Status: None   Collection Time: 01/29/18  1:43 PM  Result Value Ref Range Status   Specimen Description   Final    BLOOD LEFT ANTECUBITAL Performed at [facility] [address] [place], [place] [address]    Special Requests   Final    BOTTLES DRAWN AEROBIC ONLY Blood Culture results may not be optimal due to an inadequate volume of blood received in culture bottles Performed at [facility] [address], [place], [place] [address]    Culture   Final    NO GROWTH 5 DAYS Performed at [facility], [place] [address], [place], [place] [address]    Report Status 02/03/2018 FINAL  Final  Culture, Urine     Status: None   Collection Time: 01/29/18  6:06 PM  Result Value Ref Range Status   Specimen Description   Final    URINE, CLEAN CATCH Performed at [facility], [place], [place] [address] [place], [place] [address]    Special Requests   Final    NONE Performed at [facility], [place] [address] [place], [place] [address]    Culture   Final    NO GROWTH Performed at [facility], [place] [address], [place], [place] [address]    Report Status 01/31/2018 FINAL  Final  MRSA PCR Screening     Status: None   Collection Time: 01/29/18  6:56 PM  Result Value Ref Range Status   MRSA by PCR NEGATIVE NEGATIVE Final    Comment:        The GeneXpert MRSA Assay (FDA approved for NASAL specimens only), is one component of a comprehensive MRSA colonization surveillance program. It is not intended to diagnose MRSA infection nor to guide or monitor treatment for MRSA infections. Performed at [facility], [place] [address] [place], [place] [address]     Radiology Studies: No results found. Scheduled Meds: . apixaban  2.5 mg Oral  BID  . demeclocycline  150 mg Oral Q12H  . diltiazem  360 mg Oral Daily  . feeding supplement (ENSURE ENLIVE)  237 mL Oral Q24H  . furosemide  20 mg Oral Daily  .  metoprolol tartrate  75 mg Oral BID  . multivitamin with minerals  1 tablet Oral Daily  . pantoprazole  40 mg Oral Daily  . potassium chloride  40 mEq Oral BID  . rosuvastatin  10 mg Oral Daily  . sodium chloride  2 g Oral TID WC  . tolvaptan  15 mg Oral Q24H   Continuous Infusions:   LOS: 8 days   Kerney Elbe, DO [facility] Pager [phone]  If 7PM-7AM, please contact night-coverage [email or web address] Password [facility] 02/06/2018, 4:52 PM

## 2018-02-06 NOTE — Progress Notes (Signed)
Occupational Therapy Treatment Patient Details Name: Donna Ortega MRN: [id number] DOB: 1925/09/22 Today's Date: 02/06/2018    History of present illness 84 y.o. female with medical history significant of chronic hyponatremia, CAD, three-vessel CABG, GI bleed, diverticulitis, history of A. fib, hypertension admitted with altered mental status on 01/29/18, found to have sodium 113, recent  urgent care visit forUTI.   OT comments  Pt with improvements in functional mobility and activity tolerance today; able to perform lap around unit with min guard assist throughout. Pt A&O x3, follows commands consistently, and answers questions appropriately. Pt would continue to benefit from short-term SNF to maximize independence and safety with ADL and functional mobility prior to return home. Will continue to follow acutely.   Follow Up Recommendations  SNF;Supervision/Assistance - 24 hour    Equipment Recommendations  Other (comment)(TBD at next venue)    Recommendations for Other Services      Precautions / Restrictions Precautions Precautions: Fall Restrictions Weight Bearing Restrictions: No       Mobility Bed Mobility               General bed mobility comments: Pt OOB in chair upon arrival  Transfers Overall transfer level: Needs assistance Equipment used: Rolling walker (2 wheeled) Transfers: Sit to/from Stand Sit to Stand: Min guard         General transfer comment: For safety, cues for hand placement with RW    Balance Overall balance assessment: Mild deficits observed, not formally tested                                         ADL either performed or assessed with clinical judgement   ADL Overall ADL's : Needs assistance/impaired Eating/Feeding: Independent                       Toilet Transfer: Min guard;Ambulation;RW Toilet Transfer Details (indicate cue type and reason): Simulated by sit to stand from chair with functional  mobility         Functional mobility during ADLs: Min guard;Rolling walker General ADL Comments: Pt declining ADL at this time, reports she just wants to walk. Able to perform one whole lap in unit with min guard assist throughout     Vision       Perception     Praxis      Cognition Arousal/Alertness: Awake/alert Behavior During Therapy: [name] for tasks assessed/performed Overall Cognitive Status: No family/caregiver present to determine baseline cognitive functioning Area of Impairment: Orientation                 Orientation Level: Situation             General Comments: Follows commands appropriately.        Exercises     Shoulder Instructions       General Comments      Pertinent Vitals/ Pain       Pain Assessment: No/denies pain  Home Living                                          Prior Functioning/Environment              Frequency  Min 2X/week        Progress Toward Goals  OT  Goals(current goals can now be found in the care plan section)  Progress towards OT goals: Progressing toward goals  Acute Rehab OT Goals Patient Stated Goal: none stated OT Goal Formulation: With patient  Plan Discharge plan remains appropriate    Co-evaluation                 AM-PAC PT "6 Clicks" Daily Activity     Outcome Measure   Help from another person eating meals?: None Help from another person taking care of personal grooming?: A Little Help from another person toileting, which includes using toliet, bedpan, or urinal?: A Little Help from another person bathing (including washing, rinsing, drying)?: A Little Help from another person to put on and taking off regular upper body clothing?: A Little Help from another person to put on and taking off regular lower body clothing?: A Little 6 Click Score: 19    End of Session Equipment Utilized During Treatment: Rolling walker  OT Visit Diagnosis: Muscle weakness  (generalized) (M62.81)   Activity Tolerance Patient tolerated treatment well   Patient Left in chair;with call bell/phone within reach;with chair alarm set   Nurse Communication          Time: 8251-8984 OT Time Calculation (min): 11 min  Charges: OT General Charges $OT Visit: 1 Visit OT Treatments $Therapeutic Activity: 8-22 mins  Donna Ortega A. Donna Ortega, M.S., OTR/L Acute Rehab Department: [phone]   Donna Ortega 02/06/2018, 10:31 AM

## 2018-02-06 NOTE — Progress Notes (Signed)
[facility] ROUNDING NOTE   Subjective:     84 y.o.femalewith medical history significant ofchronic hyponatremia, CAD, three-vessel CABG, GI bleed, diverticulitis, history of A. fib, hypertension  She was admitted form home with altered mental status and increased lethargy and falls   It was noted that her sodium was 113   This responded to IV saline and Na improved to 128.  but then Na fell to 124 and today is still 124.  Still confused   Objective:  Vital signs in last 24 hours:  Temp:  [98.7 F (37.1 C)-99.1 F (37.3 C)] 98.7 F (37.1 C) (06/25 1351) Pulse Rate:  [84-94] 94 (06/25 1351) Resp:  [12-20] 17 (06/25 1351) BP: (106-157)/(66-85) 106/66 (06/25 1351) SpO2:  [97 %-100 %] 97 % (06/25 1351)  Weight change:  Filed Weights   01/29/18 1318 01/29/18 1900  Weight: 59 kg (130 lb) 59.6 kg (131 lb 6.3 oz)    Intake/Output: I/O last 3 completed shifts: In: 480 [P.O.:480] Out: 3625 [Urine:3625]   Intake/Output this shift:  Total I/O In: 77 [P.O.:60] Out: -   No acute confusion  CVS- RRR no murmurs rubs or gallops  RS- CTA  Slightly decreased  At base  ABD- BS present soft non-distended EXT- no edema   Basic Metabolic Panel: Recent Labs  Lab 02/02/18 0418 02/03/18 0427 02/04/18 0359 02/05/18 0422 02/05/18 1648 02/06/18 0448  NA 122* 125* 128* 124* 124* 124*  K 4.0 3.7 3.6 3.6 3.4* 4.5  CL 92* 90* 93* 89* 88* 89*  CO2 24 26 28 27 27 25   GLUCOSE 112* 111* 110* 116* 189* 109*  BUN 21* 24* 25* 21* 25* 23  CREATININE 0.74 0.76 0.87 0.72 0.94 0.71  CALCIUM 8.2* 8.2* 8.2* 8.6* 8.8* 9.1  MG 1.7 1.6* 2.0 1.7  --  1.7  PHOS 3.0 2.5 2.9 3.0  --  3.1    Liver Function Tests: Recent Labs  Lab 02/02/18 0418 02/03/18 0427 02/04/18 0359 02/05/18 0422 02/06/18 0448  AST 18 18 19 20 22   ALT 17 16 17 20 22   ALKPHOS 51 47 45 51 59  BILITOT 0.4 0.3 0.7 0.4 0.5  PROT 5.5* 5.5* 5.9* 6.2* 6.9  ALBUMIN 3.3* 3.2* 3.4* 3.5 3.9   No results for  input(s): LIPASE, AMYLASE in the last 168 hours. No results for input(s): AMMONIA in the last 168 hours.  CBC: Recent Labs  Lab 02/02/18 0418 02/03/18 0427 02/04/18 0359 02/05/18 0422 02/06/18 0448  WBC 8.4 8.1 8.6 9.1 10.7*  NEUTROABS 6.3 5.9 6.4 6.9 8.5*  HGB 11.6* 11.1* 11.9* 12.1 13.9  HCT 32.4* 31.6* 34.0* 34.3* 38.6  MCV 88.3 89.5 90.2 90.0 90.0  PLT 184 189 192 215 243    Cardiac Enzymes: No results for input(s): CKTOTAL, CKMB, CKMBINDEX, TROPONINI in the last 168 hours.  BNP: Invalid input(s): POCBNP  CBG: No results for input(s): GLUCAP in the last 168 hours.  Microbiology: Results for orders placed or performed during the hospital encounter of 01/29/18  Culture, blood (Routine x 2)     Status: None   Collection Time: 01/29/18  1:43 PM  Result Value Ref Range Status   Specimen Description   Final    BLOOD RIGHT HAND Performed at [facility], [place] [address] [place], [place] [address]    Special Requests   Final    BOTTLES DRAWN AEROBIC AND ANAEROBIC Blood Culture results may not be optimal due to an inadequate volume of blood received in  culture bottles Performed at [place] [address], [place], [place] [address]    Culture   Final    NO GROWTH 5 DAYS Performed at [facility], [place] [address], [place], [place] [address]    Report Status 02/03/2018 FINAL  Final  Culture, blood (Routine x 2)     Status: None   Collection Time: 01/29/18  1:43 PM  Result Value Ref Range Status   Specimen Description   Final    BLOOD LEFT ANTECUBITAL Performed at [place] [address], [place], [place] [address]    Special Requests   Final    BOTTLES DRAWN AEROBIC ONLY Blood Culture results may not be optimal due to an inadequate volume of blood received in culture bottles Performed at [place] [address], [place], [place] [address]    Culture   Final    NO GROWTH 5  DAYS Performed at [facility], [place] [address], [place], [place] [address]    Report Status 02/03/2018 FINAL  Final  Culture, Urine     Status: None   Collection Time: 01/29/18  6:06 PM  Result Value Ref Range Status   Specimen Description   Final    URINE, CLEAN CATCH Performed at [facility], [place] [address] [place], [place] [address]    Special Requests   Final    NONE Performed at [facility], [place] [address], [place], [place] [address]    Culture   Final    NO GROWTH Performed at [facility], [place] [address] [place],  [address]    Report Status 01/31/2018 FINAL  Final  MRSA PCR Screening     Status: None   Collection Time: 01/29/18  6:56 PM  Result Value Ref Range Status   MRSA by PCR NEGATIVE NEGATIVE Final    Comment:        The GeneXpert MRSA Assay (FDA approved for NASAL specimens only), is one component of a comprehensive MRSA colonization surveillance program. It is not intended to diagnose MRSA infection nor to guide or monitor treatment for MRSA infections. Performed at [facility], [place] [address], [place],  [address]     Coagulation Studies: No results for input(s): LABPROT, INR in the last 72 hours.  Urinalysis: No results for input(s): COLORURINE, LABSPEC, PHURINE, GLUCOSEU, HGBUR, BILIRUBINUR, KETONESUR, PROTEINUR, UROBILINOGEN, NITRITE, LEUKOCYTESUR in the last 72 hours.  Invalid input(s): APPERANCEUR    Imaging: No results found.   Medications:    . apixaban  2.5 mg Oral BID  . demeclocycline  150 mg Oral Q12H  . diltiazem  360 mg Oral Daily  . feeding supplement (ENSURE ENLIVE)  237 mL Oral Q24H  . furosemide  20 mg Oral Daily  . metoprolol tartrate  75 mg Oral BID  . multivitamin with minerals  1 tablet Oral Daily  . pantoprazole  40 mg Oral Daily  . potassium chloride  40 mEq Oral BID  . rosuvastatin  10 mg Oral Daily  . sodium chloride  2 g Oral  TID WC  . tolvaptan  15 mg Oral Q24H   acetaminophen, diphenhydrAMINE, ondansetron (ZOFRAN) IV  Assessment/ Plan:    Hyponatremia: initially felt hypovolemic w Na 113.  Patient got better with normal saline, then worsened again from 128 down to Na 124; suspect there is underlying poor oral solute intake.  Will give tolvaptan x 1 today.  For now also will continue on salt tabs/ low dose po lasix/ demeclocycline. No fluid restrict while getting vaptan.   PAF/ sp PPM/ hx atrial tach - now in sinus rhythm  She continues on diltiazem and eliquis   Dementia  Altered mental status - confusion didn't resolve w/ correction of serum na  FTT/ gen weakness  DNR  HTN - on po dilt/ metoprolol and now lasix po 20 qd for #1  Dispo - for SNFP when medically ready    Kelly Splinter MD [employer] pgr [phone]   02/06/2018, 2:33 PM

## 2018-02-07 LAB — CBC WITH DIFFERENTIAL/PLATELET
Basophils Absolute: 0 10*3/uL (ref 0.0–0.1)
Basophils Relative: 0 %
EOS PCT: 1 %
Eosinophils Absolute: 0.1 10*3/uL (ref 0.0–0.7)
HEMATOCRIT: 36.6 % (ref 36.0–46.0)
Hemoglobin: 12.6 g/dL (ref 12.0–15.0)
LYMPHS ABS: 1.2 10*3/uL (ref 0.7–4.0)
LYMPHS PCT: 14 %
MCH: 31.3 pg (ref 26.0–34.0)
MCHC: 34.4 g/dL (ref 30.0–36.0)
MCV: 91 fL (ref 78.0–100.0)
MONO ABS: 1 10*3/uL (ref 0.1–1.0)
Monocytes Relative: 11 %
NEUTROS ABS: 6.5 10*3/uL (ref 1.7–7.7)
Neutrophils Relative %: 74 %
PLATELETS: 228 10*3/uL (ref 150–400)
RBC: 4.02 MIL/uL (ref 3.87–5.11)
RDW: 12.9 % (ref 11.5–15.5)
WBC: 8.8 10*3/uL (ref 4.0–10.5)

## 2018-02-07 LAB — COMPREHENSIVE METABOLIC PANEL
ALT: 22 U/L (ref 0–44)
AST: 21 U/L (ref 15–41)
Albumin: 3.6 g/dL (ref 3.5–5.0)
Alkaline Phosphatase: 53 U/L (ref 38–126)
Anion gap: 8 (ref 5–15)
BILIRUBIN TOTAL: 0.4 mg/dL (ref 0.3–1.2)
BUN: 30 mg/dL — AB (ref 8–23)
CHLORIDE: 97 mmol/L — AB (ref 98–111)
CO2: 25 mmol/L (ref 22–32)
CREATININE: 0.99 mg/dL (ref 0.44–1.00)
Calcium: 9 mg/dL (ref 8.9–10.3)
GFR, EST AFRICAN AMERICAN: 57 mL/min — AB (ref >60)
GFR, EST NON AFRICAN AMERICAN: 49 mL/min — AB (ref >60)
Glucose, Bld: 118 mg/dL — ABNORMAL HIGH (ref 70–99)
POTASSIUM: 4.5 mmol/L (ref 3.5–5.1)
Sodium: 130 mmol/L — ABNORMAL LOW (ref 135–145)
TOTAL PROTEIN: 6.3 g/dL — AB (ref 6.5–8.1)

## 2018-02-07 LAB — SODIUM, URINE, RANDOM: Sodium, Ur: 110 mmol/L

## 2018-02-07 LAB — MAGNESIUM: MAGNESIUM: 2.1 mg/dL (ref 1.7–2.4)

## 2018-02-07 LAB — OSMOLALITY, URINE: Osmolality, Ur: 534 mOsm/kg (ref 300–900)

## 2018-02-07 LAB — PHOSPHORUS: Phosphorus: 4.3 mg/dL (ref 2.5–4.6)

## 2018-02-07 MED ORDER — SODIUM CHLORIDE 1 G PO TABS
2.0000 g | ORAL_TABLET | Freq: Two times a day (BID) | ORAL | Status: AC
  Administered 2018-02-07 – 2018-02-08 (×3): 2 g via ORAL
  Filled 2018-02-07 (×3): qty 2

## 2018-02-07 NOTE — Progress Notes (Signed)
[facility] ROUNDING NOTE   Subjective:   Feeling better, today , up in chair and fully oriented.     Objective:  Vital signs in last 24 hours:  Temp:  [98.1 F (36.7 C)-98.9 F (37.2 C)] 98.2 F (36.8 C) (06/26 1354) Pulse Rate:  [78-95] 78 (06/26 1354) Resp:  [16-20] 20 (06/26 1354) BP: (107-127)/(63-84) 111/64 (06/26 1354) SpO2:  [96 %-98 %] 98 % (06/26 1354) Weight:  [59 kg (130 lb)-63 kg (139 lb)] 63 kg (139 lb) (06/26 0516)  Weight change:  Filed Weights   01/29/18 1900 02/07/18 0500 02/07/18 0516  Weight: 59.6 kg (131 lb 6.3 oz) 59 kg (130 lb) 63 kg (139 lb)    Intake/Output: I/O last 3 completed shifts: In: 61 [P.O.:60] Out: 2925 [Urine:2925]   Intake/Output this shift:  Total I/O In: 240 [P.O.:240] Out: -   No distress, up in recliner, Ox 3 CVS- RRR no murmurs rubs or gallops  RS- CTA  Slightly decreased  At base  ABD- BS present soft non-distended EXT- no edema   Basic Metabolic Panel: Recent Labs  Lab 02/03/18 0427 02/04/18 0359 02/05/18 0422 02/05/18 1648 02/06/18 0448 02/06/18 1744 02/07/18 0151  NA 125* 128* 124* 124* 124* 129* 130*  K 3.7 3.6 3.6 3.4* 4.5  --  4.5  CL 90* 93* 89* 88* 89*  --  97*  CO2 26 28 27 27 25   --  25  GLUCOSE 111* 110* 116* 189* 109*  --  118*  BUN 24* 25* 21* 25* 23  --  30*  CREATININE 0.76 0.87 0.72 0.94 0.71  --  0.99  CALCIUM 8.2* 8.2* 8.6* 8.8* 9.1  --  9.0  MG 1.6* 2.0 1.7  --  1.7  --  2.1  PHOS 2.5 2.9 3.0  --  3.1  --  4.3    Liver Function Tests: Recent Labs  Lab 02/03/18 0427 02/04/18 0359 02/05/18 0422 02/06/18 0448 02/07/18 0151  AST 18 19 20 22 21   ALT 16 17 20 22 22   ALKPHOS 47 45 51 59 53  BILITOT 0.3 0.7 0.4 0.5 0.4  PROT 5.5* 5.9* 6.2* 6.9 6.3*  ALBUMIN 3.2* 3.4* 3.5 3.9 3.6   No results for input(s): LIPASE, AMYLASE in the last 168 hours. No results for input(s): AMMONIA in the last 168 hours.  CBC: Recent Labs  Lab 02/03/18 0427 02/04/18 0359 02/05/18 0422  02/06/18 0448 02/07/18 0151  WBC 8.1 8.6 9.1 10.7* 8.8  NEUTROABS 5.9 6.4 6.9 8.5* 6.5  HGB 11.1* 11.9* 12.1 13.9 12.6  HCT 31.6* 34.0* 34.3* 38.6 36.6  MCV 89.5 90.2 90.0 90.0 91.0  PLT 189 192 215 243 228    Cardiac Enzymes: No results for input(s): CKTOTAL, CKMB, CKMBINDEX, TROPONINI in the last 168 hours.  BNP: Invalid input(s): POCBNP  CBG: No results for input(s): GLUCAP in the last 168 hours.  Microbiology: Results for orders placed or performed during the hospital encounter of 01/29/18  Culture, blood (Routine x 2)     Status: None   Collection Time: 01/29/18  1:43 PM  Result Value Ref Range Status   Specimen Description   Final    BLOOD RIGHT HAND Performed at [facility], [place] [address], [place], [place] [address]    Special Requests   Final    BOTTLES DRAWN AEROBIC AND ANAEROBIC Blood Culture results may not be optimal due to an inadequate volume of blood received in culture bottles Performed at [facility], [place] [address], [place], [place] [address]    Culture   Final    NO GROWTH 5 DAYS Performed at [facility], [place] [address], [place], [place] [address]    Report Status 02/03/2018 FINAL  Final  Culture, blood (Routine x 2)     Status: None   Collection Time: 01/29/18  1:43 PM  Result Value Ref Range Status   Specimen Description   Final    BLOOD LEFT ANTECUBITAL Performed at [place] [address], [place], [place] [address]    Special Requests   Final    BOTTLES DRAWN AEROBIC ONLY Blood Culture results may not be optimal due to an inadequate volume of blood received in culture bottles Performed at [place] [address], [place], [place] [address]    Culture   Final    NO GROWTH 5 DAYS Performed at [facility], [place] [address], [place], [place] [address]    Report Status 02/03/2018 FINAL  Final  Culture, Urine     Status: None   Collection Time:  01/29/18  6:06 PM  Result Value Ref Range Status   Specimen Description   Final    URINE, CLEAN CATCH Performed at [facility], [place] [address] [place], [place] [address]    Special Requests   Final    NONE Performed at [facility] [place] [address], [place], [place] [address]    Culture   Final    NO GROWTH Performed at [facility], [place] [address], [place], [place] [address]    Report Status 01/31/2018 FINAL  Final  MRSA PCR Screening     Status: None   Collection Time: 01/29/18  6:56 PM  Result Value Ref Range Status   MRSA by PCR NEGATIVE NEGATIVE Final    Comment:        The GeneXpert MRSA Assay (FDA approved for NASAL specimens only), is one component of a comprehensive MRSA colonization surveillance program. It is not intended to diagnose MRSA infection nor to guide or monitor treatment for MRSA infections. Performed at [facility], [place] [address] [place], [place] [address]     Coagulation Studies: No results for input(s): LABPROT, INR in the last 72 hours.  Urinalysis: No results for input(s): COLORURINE, LABSPEC, PHURINE, GLUCOSEU, HGBUR, BILIRUBINUR, KETONESUR, PROTEINUR, UROBILINOGEN, NITRITE, LEUKOCYTESUR in the last 72 hours.  Invalid input(s): APPERANCEUR    Imaging: No results found.   Medications:    . apixaban  2.5 mg Oral BID  . demeclocycline  150 mg Oral Q12H  . diltiazem  360 mg Oral Daily  . feeding supplement (ENSURE ENLIVE)  237 mL Oral Q24H  . furosemide  20 mg Oral Daily  . metoprolol tartrate  75 mg Oral BID  . multivitamin with minerals  1 tablet Oral Daily  . pantoprazole  40 mg Oral Daily  . rosuvastatin  10 mg Oral Daily  . sodium chloride  2 g Oral BID WC   acetaminophen, diphenhydrAMINE, ondansetron (ZOFRAN) IV  Assessment/ Plan:    Hyponatremia: initially felt hypovolemic w Na 113.  Patient got better with normal saline, then Na dropped again and Uosm was  high c/w SIADH.  Started on demeclocycline/ salt tabs/ fluid restrict and lasix 20 po qd.  Got one dose tolvaptan 6/25 w/ good results. Na 130 today.  Have dc'd tolvaptan, would continue lasix/ demeclocycline and fluid restriction. Give 3  more days of salt tabs bid then dc. No further suggestions. Will sign off.   PAF/ sp PPM/ hx atrial tach - now in sinus rhythm  She continues on diltiazem and eliquis   Dementia  Altered mental status - confusion didn't resolve w/ correction of serum na  FTT/ gen weakness  DNR  HTN - on po dilt/ metoprolol and now lasix po 20 qd for #1  Dispo - for SNFP when medically ready    Kelly Splinter MD [employer] pgr [phone]   02/07/2018, 2:42 PM

## 2018-02-07 NOTE — Progress Notes (Signed)
PROGRESS NOTE  Donna Ortega:[id number] DOB: 06-04-1926 DOA: 01/29/2018 PCP: Lanice Shirts, MD  HPI/Recap of past 24 hours: [name] is a 84 year old female with past medical history significant for chronic hyponatremia, coronary artery disease status post CABG, GI bleed, diverticulitis, paroxysmal A. fib, hypertension who presented to the hospital on 01/29/2018 with altered mental status.  Found to be severely hyponatremic with sodium of 113 on admission.  This responded well to IV fluid. Sodium improved to 128.  Then sodium fell to 124 on 02/06/2018 and patient had confusion.  Nephrology consulted and following.  02/07/2018: Patient seen and examined with her son at her bedside.  She is alert oriented x3.  Spoke with her son who is also medical power of attorney patient was independent of her ADLs and was walking without assistance.  PT consulted to reassess.  Denies headaches, dizziness, chest pain, or dyspnea.  She has no new complaints.  Assessment/Plan: Active Problems:   Atrial fibrillation, chronic (HCC)   Hyponatremia   Severe hyponatremia, improving Sodium on presentation 113 Responded well to IV fluid Nephrology consulted and following Sodium worsened from 128 to 124 on 02/06/18 Tolvaptan given once Started on salt tablets and low-dose p.o. Lasix and demeclocycline Sodium today 130 Nephrology following.  Highly appreciated. Repeat BMP in the morning  Acute metabolic encephalopathy, resolved Most likely secondary to hyponatremia Today she is alert and oriented x3 PT to reassess for ambulatory needs now that she is more alert and better oriented.  Paroxysmal A. fib status post PPM Continue monitor on telemetry Rate controlled Continue Lopressor p.o. Cardizem Continue Eliquis  Hyperlipidemia Continue Crestor  Hypertension Blood pressures well controlled Continue home meds Lopressor, diltiazem  GERD Continue Protonix p.o.    Code Status: DNR  Family  Communication: Son at bedside  Disposition Plan: SNF when clinically stable   Consultants:  Nephrology  Procedures:  None  Antimicrobials:  None  DVT prophylaxis: eliquis   Objective: Vitals:   02/06/18 2012 02/07/18 0500 02/07/18 0516 02/07/18 1026  BP: 107/63  127/83 125/84  Pulse: 95  82 78  Resp: 16  16   Temp: 98.1 F (36.7 C)  98.9 F (37.2 C)   TempSrc: Oral  Oral   SpO2: 96%  98%   Weight:  59 kg (130 lb) 63 kg (139 lb)   Height:        Intake/Output Summary (Last 24 hours) at 02/07/2018 1033 Last data filed at 02/07/2018 9562 Gross per 24 hour  Intake 120 ml  Output 2000 ml  Net -1880 ml   Filed Weights   01/29/18 1900 02/07/18 0500 02/07/18 0516  Weight: 59.6 kg (131 lb 6.3 oz) 59 kg (130 lb) 63 kg (139 lb)    Exam:  . General: 84 y.o. year-old female well developed well nourished in no acute distress.  Alert and oriented x3. . Cardiovascular: Regular rate and rhythm with no rubs or gallops.  No thyromegaly or JVD noted.   Marland Kitchen Respiratory: Clear to auscultation with no wheezes or rales. Good inspiratory effort. . Abdomen: Soft nontender nondistended with normal bowel sounds x4 quadrants. . Musculoskeletal: Trace edema in lower extremities bilaterally. 2/4 pulses in all 4 extremities. . Skin: No ulcerative lesions noted or rashes . Psychiatry: Mood is appropriate for condition and setting   Data Reviewed: CBC: Recent Labs  Lab 02/03/18 0427 02/04/18 0359 02/05/18 0422 02/06/18 0448 02/07/18 0151  WBC 8.1 8.6 9.1 10.7* 8.8  NEUTROABS 5.9 6.4 6.9 8.5* 6.5  HGB 11.1* 11.9* 12.1 13.9 12.6  HCT 31.6* 34.0* 34.3* 38.6 36.6  MCV 89.5 90.2 90.0 90.0 91.0  PLT 189 192 215 243 332   Basic Metabolic Panel: Recent Labs  Lab 02/03/18 0427 02/04/18 0359 02/05/18 0422 02/05/18 1648 02/06/18 0448 02/06/18 1744 02/07/18 0151  NA 125* 128* 124* 124* 124* 129* 130*  K 3.7 3.6 3.6 3.4* 4.5  --  4.5  CL 90* 93* 89* 88* 89*  --  97*  CO2 26 28 27  27 25   --  25  GLUCOSE 111* 110* 116* 189* 109*  --  118*  BUN 24* 25* 21* 25* 23  --  30*  CREATININE 0.76 0.87 0.72 0.94 0.71  --  0.99  CALCIUM 8.2* 8.2* 8.6* 8.8* 9.1  --  9.0  MG 1.6* 2.0 1.7  --  1.7  --  2.1  PHOS 2.5 2.9 3.0  --  3.1  --  4.3   GFR: Estimated Creatinine Clearance: 34.5 mL/min (by C-G formula based on SCr of 0.99 mg/dL). Liver Function Tests: Recent Labs  Lab 02/03/18 0427 02/04/18 0359 02/05/18 0422 02/06/18 0448 02/07/18 0151  AST 18 19 20 22 21   ALT 16 17 20 22 22   ALKPHOS 47 45 51 59 53  BILITOT 0.3 0.7 0.4 0.5 0.4  PROT 5.5* 5.9* 6.2* 6.9 6.3*  ALBUMIN 3.2* 3.4* 3.5 3.9 3.6   No results for input(s): LIPASE, AMYLASE in the last 168 hours. No results for input(s): AMMONIA in the last 168 hours. Coagulation Profile: No results for input(s): INR, PROTIME in the last 168 hours. Cardiac Enzymes: No results for input(s): CKTOTAL, CKMB, CKMBINDEX, TROPONINI in the last 168 hours. BNP (last 3 results) No results for input(s): PROBNP in the last 8760 hours. HbA1C: Recent Labs    02/05/18 0422  HGBA1C 5.7*   CBG: No results for input(s): GLUCAP in the last 168 hours. Lipid Profile: No results for input(s): CHOL, HDL, LDLCALC, TRIG, CHOLHDL, LDLDIRECT in the last 72 hours. Thyroid Function Tests: No results for input(s): TSH, T4TOTAL, FREET4, T3FREE, THYROIDAB in the last 72 hours. Anemia Panel: No results for input(s): VITAMINB12, FOLATE, FERRITIN, TIBC, IRON, RETICCTPCT in the last 72 hours. Urine analysis:    Component Value Date/Time   COLORURINE YELLOW 01/29/2018 1806   APPEARANCEUR CLEAR 01/29/2018 1806   LABSPEC 1.010 01/29/2018 1806   PHURINE 7.0 01/29/2018 1806   GLUCOSEU NEGATIVE 01/29/2018 1806   HGBUR NEGATIVE 01/29/2018 1806   BILIRUBINUR NEGATIVE 01/29/2018 1806   BILIRUBINUR neg 09/24/2014 0907   KETONESUR 5 (A) 01/29/2018 1806   PROTEINUR NEGATIVE 01/29/2018 1806   UROBILINOGEN negative 09/24/2014 0907   UROBILINOGEN 0.2  08/20/2011 2000   NITRITE NEGATIVE 01/29/2018 1806   LEUKOCYTESUR NEGATIVE 01/29/2018 1806   Sepsis Labs: @LABRCNTIP (procalcitonin:4,lacticidven:4)  ) Recent Results (from the past 240 hour(s))  Culture, blood (Routine x 2)     Status: None   Collection Time: 01/29/18  1:43 PM  Result Value Ref Range Status   Specimen Description   Final    BLOOD RIGHT HAND Performed at [facility], [place] [address] [place], [place] [address]    Special Requests   Final    BOTTLES DRAWN AEROBIC AND ANAEROBIC Blood Culture results may not be optimal due to an inadequate volume of blood received in culture bottles Performed at [place] [address], [place], [place] [address]    Culture   Final    NO GROWTH 5 DAYS  Performed at [facility], [place] [address], [place], [place] [address]    Report Status 02/03/2018 FINAL  Final  Culture, blood (Routine x 2)     Status: None   Collection Time: 01/29/18  1:43 PM  Result Value Ref Range Status   Specimen Description   Final    BLOOD LEFT ANTECUBITAL Performed at [place] [address] [place], [place] [address]    Special Requests   Final    BOTTLES DRAWN AEROBIC ONLY Blood Culture results may not be optimal due to an inadequate volume of blood received in culture bottles Performed at [place] [address], [place], [place] [address]    Culture   Final    NO GROWTH 5 DAYS Performed at [facility], [place] [address] [place], [place] [address]    Report Status 02/03/2018 FINAL  Final  Culture, Urine     Status: None   Collection Time: 01/29/18  6:06 PM  Result Value Ref Range Status   Specimen Description   Final    URINE, CLEAN CATCH Performed at [facility], [place] [address] [place], [place] [address]    Special Requests   Final    NONE Performed at [facility], [place] [address] [place], [place]  [address]    Culture   Final    NO GROWTH Performed at [facility], [place] [address], [place], [place] [address]    Report Status 01/31/2018 FINAL  Final  MRSA PCR Screening     Status: None   Collection Time: 01/29/18  6:56 PM  Result Value Ref Range Status   MRSA by PCR NEGATIVE NEGATIVE Final    Comment:        The GeneXpert MRSA Assay (FDA approved for NASAL specimens only), is one component of a comprehensive MRSA colonization surveillance program. It is not intended to diagnose MRSA infection nor to guide or monitor treatment for MRSA infections. Performed at [facility], [place] [address] [place], [place] [address]       Studies: No results found.  Scheduled Meds: . apixaban  2.5 mg Oral BID  . demeclocycline  150 mg Oral Q12H  . diltiazem  360 mg Oral Daily  . feeding supplement (ENSURE ENLIVE)  237 mL Oral Q24H  . furosemide  20 mg Oral Daily  . metoprolol tartrate  75 mg Oral BID  . multivitamin with minerals  1 tablet Oral Daily  . pantoprazole  40 mg Oral Daily  . rosuvastatin  10 mg Oral Daily  . sodium chloride  2 g Oral TID WC    Continuous Infusions:   LOS: 9 days     Kayleen Memos, MD [facility] Pager [phone]  If 7PM-7AM, please contact night-coverage [email or web address] Password [email or web address] 02/07/2018, 10:33 AM

## 2018-02-07 NOTE — Progress Notes (Signed)
CSW followed up with [facility] regarding patient's [facility] insurance authorization. Staff reported that patient has received insurance authorization.   CSW updated patient's attending MD. Patient's MD reported that patient is not ready for dc today.   CSW contacted patient's son Dhruvi Crenshaw [phone]) to provide update, no answer. CSW left voicemail requesting return phone call.   CSW will continue to follow and assist with discharge planning.  Abundio Miu, [place] Social Worker [facility] Cell#: [phone]

## 2018-02-07 NOTE — Progress Notes (Signed)
CSW received return call from patient's son Donna Ortega [phone]). CSW provided update to patient's son. Patient's son reported that patient will need PTAR when ready for discharge. CSW will continue to follow and assist with discharge planning.   Abundio Miu, [place] Social Worker [facility] Cell#: [phone]

## 2018-02-07 NOTE — Progress Notes (Signed)
Physical Therapy Treatment Patient Details Name: Donna Ortega MRN: [id number] DOB: December 10, 1925 Today's Date: 02/07/2018    History of Present Illness 84 y.o. female with medical history significant of chronic hyponatremia, CAD, three-vessel CABG, GI bleed, diverticulitis, history of A. fib, hypertension admitted with altered mental status on 01/29/18, found to have sodium 113, recent  urgent care visit forUTI.    PT Comments    Pt is progressing with mobility. Still note some mild confusion on today. Continue to feel pt could benefit from a short rehab stay prior to returning home. Will continue to follow and progress activity as tolerated.   Follow Up Recommendations  SNF     Equipment Recommendations  Rolling walker with 5" wheels    Recommendations for Other Services       Precautions / Restrictions Precautions Precautions: Fall Restrictions Weight Bearing Restrictions: No    Mobility  Bed Mobility   Bed Mobility: Sit to Supine       Sit to supine: Supervision   General bed mobility comments: for lines, safety  Transfers Overall transfer level: Needs assistance Equipment used: Rolling walker (2 wheeled);None Transfers: Sit to/from Stand Sit to Stand: Min assist         General transfer comment: Min assist without assistive device. Min guard with use of RW. VCs safety, hand placement.   Ambulation/Gait Ambulation/Gait assistance: Min assist Gait Distance (Feet): 150 Feet(x2) Assistive device: Rolling walker (2 wheeled);None Gait Pattern/deviations: Step-through pattern;Staggering left;Staggering right;Drifts right/left;Trunk flexed;Decreased stride length     General Gait Details: walked x 1 ~150 feet with use of RW-Min guard assist. walked x 1 ~150 feet without use of an assistive device-Min assist throughout ambulation distance. Cues for safety.    Stairs             Wheelchair Mobility    Modified Rankin (Stroke Patients Only)        Balance Overall balance assessment: Needs assistance         Standing balance support: No upper extremity supported Standing balance-Leahy Scale: Fair                              Cognition Arousal/Alertness: Awake/alert Behavior During Therapy: WFL for tasks assessed/performed Overall Cognitive Status: No family/caregiver present to determine baseline cognitive functioning Area of Impairment: Orientation                 Orientation Level: Disoriented to;Place;Situation             General Comments: Pt unable to state name of hospital      Exercises      General Comments        Pertinent Vitals/Pain Pain Assessment: No/denies pain    Home Living                      Prior Function            PT Goals (current goals can now be found in the care plan section) Progress towards PT goals: Progressing toward goals    Frequency    Min 3X/week      PT Plan Current plan remains appropriate    Co-evaluation              AM-PAC PT "6 Clicks" Daily Activity  Outcome Measure  Difficulty turning over in bed (including adjusting bedclothes, sheets and blankets)?: None Difficulty moving from lying on back to sitting on  the side of the bed? : None Difficulty sitting down on and standing up from a chair with arms (e.g., wheelchair, bedside commode, etc,.)?: A Little Help needed moving to and from a bed to chair (including a wheelchair)?: A Little Help needed walking in hospital room?: A Little Help needed climbing 3-5 steps with a railing? : A Little 6 Click Score: 20    End of Session Equipment Utilized During Treatment: Gait belt Activity Tolerance: Patient tolerated treatment well Patient left: in bed;with call bell/phone within reach;with bed alarm set   PT Visit Diagnosis: Unsteadiness on feet (R26.81);Muscle weakness (generalized) (M62.81)     Time: 4709-2957 PT Time Calculation (min) (ACUTE ONLY): 15 min  Charges:   $Gait Training: 8-22 mins                    G Codes:          Weston Anna, MPT Pager: [phone]

## 2018-02-08 ENCOUNTER — Ambulatory Visit (HOSPITAL_COMMUNITY): Payer: Medicare HMO

## 2018-02-08 DIAGNOSIS — K5900 Constipation, unspecified: Secondary | ICD-10-CM | POA: Diagnosis not present

## 2018-02-08 DIAGNOSIS — I482 Chronic atrial fibrillation: Secondary | ICD-10-CM | POA: Diagnosis not present

## 2018-02-08 DIAGNOSIS — R350 Frequency of micturition: Secondary | ICD-10-CM | POA: Diagnosis not present

## 2018-02-08 DIAGNOSIS — M6281 Muscle weakness (generalized): Secondary | ICD-10-CM | POA: Diagnosis not present

## 2018-02-08 DIAGNOSIS — I10 Essential (primary) hypertension: Secondary | ICD-10-CM | POA: Diagnosis not present

## 2018-02-08 DIAGNOSIS — R2689 Other abnormalities of gait and mobility: Secondary | ICD-10-CM | POA: Diagnosis not present

## 2018-02-08 DIAGNOSIS — I4891 Unspecified atrial fibrillation: Secondary | ICD-10-CM | POA: Diagnosis not present

## 2018-02-08 DIAGNOSIS — R41841 Cognitive communication deficit: Secondary | ICD-10-CM | POA: Diagnosis not present

## 2018-02-08 DIAGNOSIS — E861 Hypovolemia: Secondary | ICD-10-CM | POA: Diagnosis not present

## 2018-02-08 DIAGNOSIS — R195 Other fecal abnormalities: Secondary | ICD-10-CM | POA: Diagnosis not present

## 2018-02-08 DIAGNOSIS — I2510 Atherosclerotic heart disease of native coronary artery without angina pectoris: Secondary | ICD-10-CM | POA: Diagnosis not present

## 2018-02-08 DIAGNOSIS — H9190 Unspecified hearing loss, unspecified ear: Secondary | ICD-10-CM | POA: Diagnosis not present

## 2018-02-08 DIAGNOSIS — I480 Paroxysmal atrial fibrillation: Secondary | ICD-10-CM | POA: Diagnosis not present

## 2018-02-08 DIAGNOSIS — R279 Unspecified lack of coordination: Secondary | ICD-10-CM | POA: Diagnosis not present

## 2018-02-08 DIAGNOSIS — E871 Hypo-osmolality and hyponatremia: Secondary | ICD-10-CM | POA: Diagnosis not present

## 2018-02-08 DIAGNOSIS — Z743 Need for continuous supervision: Secondary | ICD-10-CM | POA: Diagnosis not present

## 2018-02-08 LAB — BASIC METABOLIC PANEL
Anion gap: 11 (ref 5–15)
BUN: 32 mg/dL — ABNORMAL HIGH (ref 8–23)
CALCIUM: 9.2 mg/dL (ref 8.9–10.3)
CHLORIDE: 97 mmol/L — AB (ref 98–111)
CO2: 23 mmol/L (ref 22–32)
CREATININE: 0.94 mg/dL (ref 0.44–1.00)
GFR calc Af Amer: >60 mL/min (ref >60)
GFR, EST NON AFRICAN AMERICAN: 52 mL/min — AB (ref >60)
Glucose, Bld: 115 mg/dL — ABNORMAL HIGH (ref 70–99)
Potassium: 4.2 mmol/L (ref 3.5–5.1)
SODIUM: 131 mmol/L — AB (ref 135–145)

## 2018-02-08 LAB — OSMOLALITY, URINE: OSMOLALITY UR: 687 mosm/kg (ref 300–900)

## 2018-02-08 LAB — SODIUM, URINE, RANDOM: SODIUM UR: 126 mmol/L

## 2018-02-08 MED ORDER — SODIUM CHLORIDE 1 G PO TABS: 2.0000 g | ORAL_TABLET | Freq: Two times a day (BID) | ORAL | 0 refills | Status: AC

## 2018-02-08 MED ORDER — METOPROLOL TARTRATE 75 MG PO TABS: 75.0000 mg | ORAL_TABLET | Freq: Two times a day (BID) | ORAL | 0 refills | Status: DC

## 2018-02-08 MED ORDER — DILTIAZEM HCL ER COATED BEADS 360 MG PO CP24: 360.0000 mg | ORAL_CAPSULE | Freq: Every day | ORAL | 0 refills | Status: DC

## 2018-02-08 MED ORDER — FUROSEMIDE 20 MG PO TABS: 20.0000 mg | ORAL_TABLET | Freq: Every day | ORAL | 0 refills | Status: DC

## 2018-02-08 MED ORDER — DEMECLOCYCLINE HCL 150 MG PO TABS: 150.0000 mg | ORAL_TABLET | Freq: Two times a day (BID) | ORAL | 0 refills | Status: AC

## 2018-02-08 NOTE — Clinical Social Work Placement (Signed)
Pt admitting to [facility]- report [phone] room [place] information provided via the [place]  NOTE  Date:  02/08/2018  Patient Details  Name: Donna Ortega MRN: [id number] Date of Birth: 1926-05-25  Clinical Social Work is seeking post-discharge placement for this patient at the [place] level of care (*CSW will initial, date and re-position this form in  chart as items are completed):  Yes   Patient/family provided with [place] Work Department's list of facilities offering this level of care within the geographic area requested by the patient (or if unable, by the patient's family).  Yes   Patient/family informed of their freedom to choose among providers that offer the needed level of care, that participate in Medicare, Medicaid or managed care program needed by the patient, have an available bed and are willing to accept the patient.  Yes   Patient/family informed of 's ownership interest in [facility] and [facility], as well as of the fact that they are under no obligation to receive care at these facilities.  PASRR submitted to EDS on       PASRR number received on       Existing PASRR number confirmed on 02/01/18     FL2 transmitted to all facilities in geographic area requested by pt/family on 01/31/18     FL2 transmitted to all facilities within larger geographic area on       Patient informed that his/her managed care company has contracts with or will negotiate with certain facilities, including the following:        Yes   Patient/family informed of bed offers received.  Patient chooses bed at       Physician recommends and patient chooses bed at [facility]    Patient to be transferred to [facility] on 02/08/18.  Patient to be transferred to facility by [facility]     Patient family notified on 02/08/18 of transfer.  Name of family member notified:  doug     PHYSICIAN    Additional Comment:    _______________________________________________ Nila Nephew, LCSW 02/08/2018, 2:14 PM [phone] coverage for [phone]

## 2018-02-08 NOTE — Discharge Summary (Addendum)
Discharge Summary  Donna Ortega:[id number] DOB: 23-Oct-1925  PCP: Lanice Shirts, MD  Admit date: 01/29/2018 Discharge date: 02/08/2018  Time spent: 25 minutes  Recommendations for Outpatient Follow-up:  1. Follow-up with PCP 2. Take your medications as prescribed 3. Continue physical therapy 4. Fall precautions 5. Fluid restriction as recommended by nephrology 6. Repeat BMP on Monday, February 12, 2018  Recommendations per nephrology:  Started on demeclocycline/ salt tabs/ fluid restrict and lasix 20 po qd.  Got one dose tolvaptan 6/25 w/ good results. Na 130 today.  Have dc'd tolvaptan, would continue lasix/ demeclocycline and fluid restriction. Give 3 more days of salt tabs bid then dc. No further suggestions. Will sign off.      Discharge Diagnoses:  Active Hospital Problems   Diagnosis Date Noted  . Hyponatremia 08/04/2011  . Atrial fibrillation, chronic ([place]) 01/11/2011    Resolved Hospital Problems  No resolved problems to display.    Discharge Condition: Stable  Diet recommendation: Resume previous diet  Vitals:   02/08/18 0410 02/08/18 1000  BP: (!) 153/75 140/74  Pulse: 81 92  Resp: 16   Temp: 99.3 F (37.4 C)   SpO2: 97%     History of present illness:  [name] is a 84 year old female with past medical history significant for chronic hyponatremia, coronary artery disease status post CABG, GI bleed, diverticulitis, paroxysmal A. fib, hypertension who presented to the hospital on 01/29/2018 with altered mental status.  Found to be severely hyponatremic with sodium of 113 on admission.  This responded well to IV fluid. Sodium improved to 128.  Then sodium fell to 124 on 02/06/2018 and patient had confusion.  Nephrology consulted and following.  02/07/2018: Patient seen and examined with her son at her bedside.  She is alert oriented x3.  Spoke with her son who is also medical power of attorney patient was independent of her ADLs and was walking without  assistance.  PT consulted to reassess.  Denies headaches, dizziness, chest pain, or dyspnea.  She has no new complaints.  02/08/2018: Patient seen and examined at her bedside.  She is alert and oriented x3.  She has no new complaints.  Denies headaches or dizziness.  On the day of discharge, the patient was hemodynamically stable.  She will need to follow-up with PCP post hospitalization.  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 Lasix and demeclocycline Sodium today 131 Repeat BMP on Monday February 12, 2018.  Acute metabolic encephalopathy, resolved Most likely secondary to hyponatremia Today she is alert and oriented x3 PT recommended SNF and rolling walker  Paroxysmal A. fib status post PPM Rate controlled Continue Lopressor p.o. Cardizem Continue Eliquis  Hyperlipidemia Continue Crestor  Hypertension Blood pressures well controlled Continue home meds Lopressor, diltiazem  GERD Continue Protonix p.o.  Ambulatory dysfunction DME rolling walker Fall precautions C/w PT at the SNF    Procedures:  None  Consultations:  Nephrology  Discharge Exam: BP 140/74   Pulse 92   Temp 99.3 F (37.4 C) (Oral)   Resp 16   Ht 5\' 3"  (1.6 m)   Wt 59.1 kg (130 lb 3.2 oz)   SpO2 97%   BMI 23.06 kg/m  . General: 84 y.o.  year-old female well developed well nourished in no acute distress.  Alert and oriented x3. . Cardiovascular: Regular rate and rhythm with no rubs or gallops.  No thyromegaly or JVD noted.   Marland Kitchen Respiratory: Clear to auscultation with no wheezes or rales. Good inspiratory effort. . Abdomen: Soft nontender nondistended with normal bowel  sounds x4 quadrants. . Musculoskeletal: Trace lower extremity edema. 2/4 pulses in all 4 extremities. . Skin: No ulcerative lesions noted or rashes . Psychiatry: Mood is appropriate for condition and setting  Discharge Instructions You were cared for by a hospitalist during your hospital stay. If you have any questions about your discharge medications or the care you received while you were in the hospital after you are discharged, you can call the unit and asked to speak with the hospitalist on call if the hospitalist that took care of you is not available. Once you are discharged, your primary care physician will handle any further medical issues. Please note that NO REFILLS for any discharge medications will be authorized once you are discharged, as it is imperative that you return to your primary care physician (or establish a relationship with a primary care physician if you do not have one) for your aftercare needs so that they can reassess your need for medications and monitor your lab values.  Discharge Instructions    For home use only DME 4 wheeled rolling walker with seat   Complete by:  As directed    Patient needs a walker to treat with the following condition:  Ambulatory dysfunction     Allergies as of 02/08/2018      Reactions   Amiodarone Hives   ALL OVER BODY HIVES/ ITCH   Aspirin Other (See Comments)   Gi bleed   Lipitor [atorvastatin Calcium] Other (See Comments)   MUSCLE ACHES   Nsaids Other (See Comments)   GI bleed   Tolmetin Other (See Comments)   GI bleed   Naproxen Other (See Comments)   Niacin Rash   Niaspan [niacin Er] Rash      Medication List    STOP taking these medications   cefdinir 300 MG capsule Commonly known as:  OMNICEF     TAKE these medications   CALTRATE 600+D PO Take 1 tablet by mouth daily.   demeclocycline 150 MG tablet Commonly known as:  DECLOMYCIN Take 1 tablet (150 mg total) by mouth every 12 (twelve) hours for 7 days.     diltiazem 360 MG 24 hr capsule Commonly known as:  CARDIZEM CD Take 1 capsule (360 mg total) by mouth daily. Start taking on:  02/09/2018   diphenhydramine-acetaminophen 25-500 MG Tabs tablet Commonly known as:  TYLENOL PM Take 2 tablets by mouth at bedtime.   ELIQUIS 2.5 MG Tabs tablet Generic drug:  apixaban TAKE 1 TABLET BY MOUTH TWICE A DAY   furosemide 20 MG tablet Commonly known as:  LASIX Take 1 tablet (20 mg total) by mouth daily. Start taking on:  02/09/2018   Metoprolol Tartrate 75 MG Tabs Take 75 mg by mouth 2 (two) times daily. What changed:    medication strength  how much to take   pantoprazole 40 MG tablet Commonly known as:  PROTONIX TAKE 1 TABLET (40 MG TOTAL) BY MOUTH DAILY.   rosuvastatin 10 MG tablet Commonly known as:  CRESTOR Take 1 tablet (10 mg total) by mouth daily.   sodium chloride 1 g tablet Take 2 tablets (2 g total) by mouth 2 (two) times daily with  a meal for 3 days.            Durable Medical Equipment  (From admission, onward)        Start     Ordered   02/08/18 0000  For home use only DME 4 wheeled rolling walker with seat    Question:  Patient needs a walker to treat with the following condition  Answer:  Ambulatory dysfunction   02/08/18 1210     Allergies  Allergen Reactions  . Amiodarone Hives    ALL OVER BODY HIVES/ ITCH   . Aspirin Other (See Comments)    Gi bleed   . Lipitor [Atorvastatin Calcium] Other (See Comments)    MUSCLE ACHES  . Nsaids Other (See Comments)    GI bleed  . Tolmetin Other (See Comments)    GI bleed  . Naproxen Other (See Comments)  . Niacin Rash  . Niaspan [Niacin Er] Rash    Contact information for follow-up providers    Schoenhoff, Altamese Cabal, MD. Call in 1 day(s).   Specialty:  Internal Medicine Why:  Please call for an appointment Contact information: [address] [place] [address] [phone]        Nahser, Wonda Cheng, MD .   Specialty:   Cardiology Contact information: [place] [address] [phone]            Contact information for after-discharge care    Destination    [facility] .   Service:  Skilled Nursing Contact information: [place] [phone]                   The results of significant diagnostics from this hospitalization (including imaging, microbiology, ancillary and laboratory) are listed below for reference.    Significant Diagnostic Studies: Dg Chest 2 View  Result Date: 01/29/2018 CLINICAL DATA:  UTI.  Altered mental status. EXAM: CHEST - 2 VIEW COMPARISON:  03/31/2017. FINDINGS: The heart is enlarged. Dual lead pacer unchanged. No consolidation or edema. No effusion or pneumothorax. Osteopenia with evidence of previous vertebral augmentation. Chronic appearing T10 and T8 vertebral body fractures. IMPRESSION: Cardiomegaly.  No active disease. Electronically Signed   By: Staci Righter M.D.   On: 01/29/2018 14:38    Microbiology: Recent Results (from the past 240 hour(s))  Culture, blood (Routine x 2)     Status: None   Collection Time: 01/29/18  1:43 PM  Result Value Ref Range Status   Specimen Description   Final    BLOOD RIGHT HAND Performed at [facility], [place] [address] [place], [place] [address]    Special Requests   Final    BOTTLES DRAWN AEROBIC AND ANAEROBIC Blood Culture results may not be optimal due to an inadequate volume of blood received in culture bottles Performed at [place] [address], [name], [place] [address]    Culture   Final    NO GROWTH 5 DAYS Performed at [facility], [place] [address] [place], [place] [address]    Report Status 02/03/2018 FINAL  Final  Culture, blood (Routine x 2)     Status: None   Collection Time: 01/29/18  1:43 PM  Result Value Ref Range Status   Specimen Description   Final    BLOOD LEFT  ANTECUBITAL Performed at [facility] [address] [place], [place] [address]    Special Requests  Final    BOTTLES DRAWN AEROBIC ONLY Blood Culture results may not be optimal due to an inadequate volume of blood received in culture bottles Performed at [facility], [place] [address] [place], [place] [address]    Culture   Final    NO GROWTH 5 DAYS Performed at [facility], [place] [address] [place], [place] [address]    Report Status 02/03/2018 FINAL  Final  Culture, Urine     Status: None   Collection Time: 01/29/18  6:06 PM  Result Value Ref Range Status   Specimen Description   Final    URINE, CLEAN CATCH Performed at [facility], [place] [address] [place], [place] [address]    Special Requests   Final    NONE Performed at [facility], [place] [address] [place], [place] [address]    Culture   Final    NO GROWTH Performed at [facility], [place] [address], [place], [place] [address]    Report Status 01/31/2018 FINAL  Final  MRSA PCR Screening     Status: None   Collection Time: 01/29/18  6:56 PM  Result Value Ref Range Status   MRSA by PCR NEGATIVE NEGATIVE Final    Comment:        The GeneXpert MRSA Assay (FDA approved for NASAL specimens only), is one component of a comprehensive MRSA colonization surveillance program. It is not intended to diagnose MRSA infection nor to guide or monitor treatment for MRSA infections. Performed at [facility], [place] [address] [place], [place] [address]      Labs: Basic Metabolic Panel: Recent Labs  Lab 02/03/18 0427 02/04/18 0359 02/05/18 0422 02/05/18 1648 02/06/18 0448 02/06/18 1744 02/07/18 0151 02/08/18 0429  NA 125* 128* 124* 124* 124* 129* 130* 131*  K 3.7 3.6 3.6 3.4* 4.5  --  4.5 4.2  CL 90* 93* 89* 88* 89*  --  97* 97*  CO2 26 28 27 27 25   --  25 23  GLUCOSE 111* 110* 116* 189* 109*  --  118* 115*  BUN  24* 25* 21* 25* 23  --  30* 32*  CREATININE 0.76 0.87 0.72 0.94 0.71  --  0.99 0.94  CALCIUM 8.2* 8.2* 8.6* 8.8* 9.1  --  9.0 9.2  MG 1.6* 2.0 1.7  --  1.7  --  2.1  --   PHOS 2.5 2.9 3.0  --  3.1  --  4.3  --    Liver Function Tests: Recent Labs  Lab 02/03/18 0427 02/04/18 0359 02/05/18 0422 02/06/18 0448 02/07/18 0151  AST 18 19 20 22 21   ALT 16 17 20 22 22   ALKPHOS 47 45 51 59 53  BILITOT 0.3 0.7 0.4 0.5 0.4  PROT 5.5* 5.9* 6.2* 6.9 6.3*  ALBUMIN 3.2* 3.4* 3.5 3.9 3.6   No results for input(s): LIPASE, AMYLASE in the last 168 hours. No results for input(s): AMMONIA in the last 168 hours. CBC: Recent Labs  Lab 02/03/18 0427 02/04/18 0359 02/05/18 0422 02/06/18 0448 02/07/18 0151  WBC 8.1 8.6 9.1 10.7* 8.8  NEUTROABS 5.9 6.4 6.9 8.5* 6.5  HGB 11.1* 11.9* 12.1 13.9 12.6  HCT 31.6* 34.0* 34.3* 38.6 36.6  MCV 89.5 90.2 90.0 90.0 91.0  PLT 189 192 215 243 228   Cardiac Enzymes: No results for input(s): CKTOTAL, CKMB, CKMBINDEX, TROPONINI in the last 168 hours. BNP: BNP (last 3 results) No results for input(s): BNP  in the last 8760 hours.  ProBNP (last 3 results) No results for input(s): PROBNP in the last 8760 hours.  CBG: No results for input(s): GLUCAP in the last 168 hours.     Signed:  Kayleen Memos, MD [facility] 02/08/2018, 12:11 PM

## 2018-02-08 NOTE — Discharge Instructions (Signed)
Hyponatremia Hyponatremia is when the amount of salt (sodium) in your blood is too low. When sodium levels are low, your cells absorb extra water and they swell. The swelling happens throughout the body, but it mostly affects the brain. What are the causes? This condition may be caused by:  Heart, kidney, or liver problems.  Thyroid problems.  Adrenal gland problems.  Metabolic conditions, such as syndrome of inappropriate antidiuretic hormone (SIADH).  Severe vomiting and diarrhea.  Certain medicines or illegal drugs.  Dehydration.  Drinking too much water.  Eating a diet that is low in sodium.  Large burns on your body.  Sweating.  What increases the risk? This condition is more likely to develop in people who:  Have long-term (chronic) kidney disease.  Have heart failure.  Have a medical condition that causes frequent or excessive diarrhea.  Have metabolic conditions, such as Addison disease or SIADH.  Take certain medicines that affect the sodium and fluid balance in the blood. Some of these medicine types include: ? Diuretics. ? NSAIDs. ? Some opioid pain medicines. ? Some antidepressants. ? Some seizure prevention medicines.  What are the signs or symptoms? Symptoms of this condition include:  Nausea and vomiting.  Confusion.  Lethargy.  Agitation.  Headache.  Seizures.  Unconsciousness.  Appetite loss.  Muscle weakness and cramping.  Feeling weak or light-headed.  Having a rapid heart rate.  Fainting, in severe cases.  How is this diagnosed? This condition is diagnosed with a medical history and physical exam. You will also have other tests, including:  Blood tests.  Urine tests.  How is this treated? Treatment for this condition depends on the cause. Treatment may include:  Fluids given through an IV tube that is inserted into one of your veins.  Medicines to correct the sodium imbalance. If medicines are causing the  condition, the medicines will need to be adjusted.  Limiting water or fluid intake to get the correct sodium balance.  Follow these instructions at home:  Take medicines only as directed by your health care provider. Many medicines can make this condition worse. Talk with your health care provider about any medicines that you are currently taking.  Carefully follow a recommended diet as directed by your health care provider.  Carefully follow instructions from your health care provider about fluid restrictions.  Keep all follow-up visits as directed by your health care provider. This is important.  Do not drink alcohol. Contact a health care provider if:  You develop worsening nausea, fatigue, headache, confusion, or weakness.  Your symptoms go away and then return.  You have problems following the recommended diet. Get help right away if:  You have a seizure.  You faint.  You have ongoing diarrhea or vomiting. This information is not intended to replace advice given to you by your health care provider. Make sure you discuss any questions you have with your health care provider. Document Released: 07/22/2002 Document Revised: 01/07/2016 Document Reviewed: 08/21/2014 Elsevier Interactive Patient Education  2018 [employer].   Hyponatremia Hyponatremia is when the amount of salt (sodium) in your blood is too low. When salt levels are low, your cells absorb extra water and they swell. The swelling happens throughout the body, but it mostly affects the brain. Follow these instructions at home:  Take medicines only as told by your doctor. Many medicines can make this condition worse. Talk with your doctor about any medicines that you are currently taking.  Carefully follow a recommended diet as  told by your doctor.  Carefully follow instructions from your doctor about fluid restrictions.  Keep all follow-up visits as told by your doctor. This is important.  Do not drink  alcohol. Contact a doctor if:  You feel sicker to your stomach (nauseous).  You feel more confused.  You feel more tired (fatigued).  Your headache gets worse.  You feel weaker.  Your symptoms go away and then they come back.  You have trouble following the diet instructions. Get help right away if:  You start to twitch and shake (have a seizure).  You pass out (faint).  You keep having watery poop (diarrhea).  You keep throwing up (vomiting). This information is not intended to replace advice given to you by your health care provider. Make sure you discuss any questions you have with your health care provider. Document Released: 04/13/2011 Document Revised: 01/07/2016 Document Reviewed: 07/28/2014 Elsevier Interactive Patient Education  [employer].

## 2018-02-08 NOTE — Progress Notes (Signed)
Nutrition Follow-up  DOCUMENTATION CODES:   Not applicable  INTERVENTION:    Ensure Enlive po once/day, each supplement provides 350 kcal and 20 grams of protein  Provide MVI daily  NUTRITION DIAGNOSIS:   Inadequate oral intake related to lethargy/confusion as evidenced by per patient/family report.  Resolved  GOAL:   Patient will meet greater than or equal to 90% of their needs  Progressing  MONITOR:   PO intake, Supplement acceptance, Weight trends, Labs, I & O's  REASON FOR ASSESSMENT:   Consult Assessment of nutrition requirement/status, Poor PO  ASSESSMENT:   84 y.o. female with medical history significant of chronic hyponatremia, CAD, three-vessel CABG, GI bleed, diverticulitis, history of A. fib, hypertension and other comorbidities. She was brought to the ED for AMS for several days. She was diagnosed with a UTI at Urgent Care and subsequently took 3 doses (across 3 days) of prescribed antibiotics. Patient had been more and more lethargic over the last few days PTA. Has been having generalized weakness and was found to have a Na+ of 113 mmol/L on admission. Admitted for metabolic encephalopathy, acute on chronic hyponatremia, and atrial fibrillation. Cardiology consulted and adjusting medications and considering cardioversion once she recovers from hyponatremia. Currently receiving IVF Hydration with NS at 75 mL/hr.    Pt's appetite is progressing per her reports. Meal completions charted as 40-100% for her last four meals. She tolerated a fruit cup, sausage, and pancakes this morning for breakfast. She drinks Ensure off and on. Had some swallowing issues with pills this morning but this has since resolved.   Weight shows to be stable. Plan for d/c to SNF when clinically stable.   Medications reviewed and include: 20 mg lasix, MVI with minerals Labs reviewed: Na 131 (L)   Diet Order:   Diet Order           Diet regular Room service appropriate? Yes; Fluid  consistency: Thin; Fluid restriction: 1200 mL Fluid  Diet effective now          EDUCATION NEEDS:   No education needs have been identified at this time  Skin:  Skin Assessment: Reviewed RN Assessment  Last BM:  02/03/18  Height:   Ht Readings from Last 1 Encounters:  01/29/18 5\' 3"  (1.6 m)    Weight:   Wt Readings from Last 1 Encounters:  02/08/18 130 lb 3.2 oz (59.1 kg)    Ideal Body Weight:  52.27 kg  BMI:  Body mass index is 23.06 kg/m.  Estimated Nutritional Needs:   Kcal:  1370-1610 (23-27 kcal/kg)  Protein:  60-70 grams (1-1.2 grams/kg)  Fluid:  >/= 1.8 L/day    Mariana Single RD, LDN Clinical Nutrition Pager # [phone]

## 2018-02-09 DIAGNOSIS — H9190 Unspecified hearing loss, unspecified ear: Secondary | ICD-10-CM | POA: Diagnosis not present

## 2018-02-09 DIAGNOSIS — K5900 Constipation, unspecified: Secondary | ICD-10-CM | POA: Diagnosis not present

## 2018-02-09 DIAGNOSIS — E871 Hypo-osmolality and hyponatremia: Secondary | ICD-10-CM | POA: Diagnosis not present

## 2018-02-09 DIAGNOSIS — I4891 Unspecified atrial fibrillation: Secondary | ICD-10-CM | POA: Diagnosis not present

## 2018-02-15 DIAGNOSIS — E871 Hypo-osmolality and hyponatremia: Secondary | ICD-10-CM | POA: Diagnosis not present

## 2018-02-15 DIAGNOSIS — K5900 Constipation, unspecified: Secondary | ICD-10-CM | POA: Diagnosis not present

## 2018-02-19 DIAGNOSIS — E871 Hypo-osmolality and hyponatremia: Secondary | ICD-10-CM | POA: Diagnosis not present

## 2018-02-20 DIAGNOSIS — R350 Frequency of micturition: Secondary | ICD-10-CM | POA: Diagnosis not present

## 2018-02-20 DIAGNOSIS — R195 Other fecal abnormalities: Secondary | ICD-10-CM | POA: Diagnosis not present

## 2018-02-26 DIAGNOSIS — E871 Hypo-osmolality and hyponatremia: Secondary | ICD-10-CM | POA: Diagnosis not present

## 2018-02-26 DIAGNOSIS — K5900 Constipation, unspecified: Secondary | ICD-10-CM | POA: Diagnosis not present

## 2018-02-26 DIAGNOSIS — I10 Essential (primary) hypertension: Secondary | ICD-10-CM | POA: Diagnosis not present

## 2018-02-26 DIAGNOSIS — I4891 Unspecified atrial fibrillation: Secondary | ICD-10-CM | POA: Diagnosis not present

## 2018-03-05 DIAGNOSIS — I129 Hypertensive chronic kidney disease with stage 1 through stage 4 chronic kidney disease, or unspecified chronic kidney disease: Secondary | ICD-10-CM | POA: Diagnosis not present

## 2018-03-05 DIAGNOSIS — R269 Unspecified abnormalities of gait and mobility: Secondary | ICD-10-CM | POA: Diagnosis not present

## 2018-03-05 DIAGNOSIS — I2510 Atherosclerotic heart disease of native coronary artery without angina pectoris: Secondary | ICD-10-CM | POA: Diagnosis not present

## 2018-03-05 DIAGNOSIS — E871 Hypo-osmolality and hyponatremia: Secondary | ICD-10-CM | POA: Diagnosis not present

## 2018-03-05 DIAGNOSIS — M6281 Muscle weakness (generalized): Secondary | ICD-10-CM | POA: Diagnosis not present

## 2018-03-05 DIAGNOSIS — I480 Paroxysmal atrial fibrillation: Secondary | ICD-10-CM | POA: Diagnosis not present

## 2018-03-05 DIAGNOSIS — E222 Syndrome of inappropriate secretion of antidiuretic hormone: Secondary | ICD-10-CM | POA: Diagnosis not present

## 2018-03-05 DIAGNOSIS — E785 Hyperlipidemia, unspecified: Secondary | ICD-10-CM | POA: Diagnosis not present

## 2018-03-05 DIAGNOSIS — N183 Chronic kidney disease, stage 3 (moderate): Secondary | ICD-10-CM | POA: Diagnosis not present

## 2018-03-09 DIAGNOSIS — R269 Unspecified abnormalities of gait and mobility: Secondary | ICD-10-CM | POA: Diagnosis not present

## 2018-03-09 DIAGNOSIS — E785 Hyperlipidemia, unspecified: Secondary | ICD-10-CM | POA: Diagnosis not present

## 2018-03-09 DIAGNOSIS — M6281 Muscle weakness (generalized): Secondary | ICD-10-CM | POA: Diagnosis not present

## 2018-03-09 DIAGNOSIS — I129 Hypertensive chronic kidney disease with stage 1 through stage 4 chronic kidney disease, or unspecified chronic kidney disease: Secondary | ICD-10-CM | POA: Diagnosis not present

## 2018-03-09 DIAGNOSIS — I480 Paroxysmal atrial fibrillation: Secondary | ICD-10-CM | POA: Diagnosis not present

## 2018-03-09 DIAGNOSIS — E222 Syndrome of inappropriate secretion of antidiuretic hormone: Secondary | ICD-10-CM | POA: Diagnosis not present

## 2018-03-09 DIAGNOSIS — E871 Hypo-osmolality and hyponatremia: Secondary | ICD-10-CM | POA: Diagnosis not present

## 2018-03-09 DIAGNOSIS — N183 Chronic kidney disease, stage 3 (moderate): Secondary | ICD-10-CM | POA: Diagnosis not present

## 2018-03-09 DIAGNOSIS — I2510 Atherosclerotic heart disease of native coronary artery without angina pectoris: Secondary | ICD-10-CM | POA: Diagnosis not present

## 2018-03-19 ENCOUNTER — Ambulatory Visit (INDEPENDENT_AMBULATORY_CARE_PROVIDER_SITE_OTHER): Payer: Medicare HMO | Admitting: *Deleted

## 2018-03-19 DIAGNOSIS — I495 Sick sinus syndrome: Secondary | ICD-10-CM

## 2018-03-20 NOTE — Progress Notes (Signed)
Remote pacemaker transmission.   

## 2018-03-28 LAB — CUP PACEART REMOTE DEVICE CHECK
Battery Voltage: 2.97 V
Brady Statistic AP VP Percent: 0.01 %
Brady Statistic AP VS Percent: 6.2 %
Brady Statistic RA Percent Paced: 5.99 %
Brady Statistic RV Percent Paced: 0.08 %
Date Time Interrogation Session: 20190805145259
Implantable Lead Implant Date: 20130626
Implantable Pulse Generator Implant Date: 20130626
Lead Channel Impedance Value: 424 Ohm
Lead Channel Sensing Intrinsic Amplitude: 11.447 mV
Lead Channel Setting Pacing Amplitude: 2 V
Lead Channel Setting Pacing Pulse Width: 0.4 ms
Lead Channel Setting Sensing Sensitivity: 0.9 mV
MDC IDC LEAD IMPLANT DT: 20130626
MDC IDC LEAD LOCATION: 753859
MDC IDC LEAD LOCATION: 753860
MDC IDC MSMT LEADCHNL RA SENSING INTR AMPL: 0.891 mV
MDC IDC MSMT LEADCHNL RV IMPEDANCE VALUE: 432 Ohm
MDC IDC SET LEADCHNL RV PACING AMPLITUDE: 2.5 V
MDC IDC STAT BRADY AS VP PERCENT: 0.08 %
MDC IDC STAT BRADY AS VS PERCENT: 93.72 %

## 2018-03-29 DIAGNOSIS — I10 Essential (primary) hypertension: Secondary | ICD-10-CM | POA: Diagnosis not present

## 2018-03-29 DIAGNOSIS — E871 Hypo-osmolality and hyponatremia: Secondary | ICD-10-CM | POA: Diagnosis not present

## 2018-03-29 DIAGNOSIS — I480 Paroxysmal atrial fibrillation: Secondary | ICD-10-CM | POA: Diagnosis not present

## 2018-03-29 DIAGNOSIS — E222 Syndrome of inappropriate secretion of antidiuretic hormone: Secondary | ICD-10-CM | POA: Diagnosis not present

## 2018-03-29 DIAGNOSIS — R531 Weakness: Secondary | ICD-10-CM | POA: Diagnosis not present

## 2018-04-02 ENCOUNTER — Encounter: Payer: Medicare HMO | Admitting: Internal Medicine

## 2018-04-03 DIAGNOSIS — K219 Gastro-esophageal reflux disease without esophagitis: Secondary | ICD-10-CM | POA: Diagnosis not present

## 2018-04-03 DIAGNOSIS — I482 Chronic atrial fibrillation: Secondary | ICD-10-CM | POA: Diagnosis not present

## 2018-04-03 DIAGNOSIS — N183 Chronic kidney disease, stage 3 (moderate): Secondary | ICD-10-CM | POA: Diagnosis not present

## 2018-04-03 DIAGNOSIS — E785 Hyperlipidemia, unspecified: Secondary | ICD-10-CM | POA: Diagnosis not present

## 2018-04-03 DIAGNOSIS — E871 Hypo-osmolality and hyponatremia: Secondary | ICD-10-CM | POA: Diagnosis not present

## 2018-04-03 DIAGNOSIS — I129 Hypertensive chronic kidney disease with stage 1 through stage 4 chronic kidney disease, or unspecified chronic kidney disease: Secondary | ICD-10-CM | POA: Diagnosis not present

## 2018-04-09 DIAGNOSIS — N390 Urinary tract infection, site not specified: Secondary | ICD-10-CM | POA: Diagnosis not present

## 2018-05-03 DIAGNOSIS — Z23 Encounter for immunization: Secondary | ICD-10-CM | POA: Diagnosis not present

## 2018-05-03 DIAGNOSIS — E871 Hypo-osmolality and hyponatremia: Secondary | ICD-10-CM | POA: Diagnosis not present

## 2018-05-03 DIAGNOSIS — K644 Residual hemorrhoidal skin tags: Secondary | ICD-10-CM | POA: Diagnosis not present

## 2018-05-17 DIAGNOSIS — E785 Hyperlipidemia, unspecified: Secondary | ICD-10-CM | POA: Diagnosis not present

## 2018-05-17 DIAGNOSIS — E871 Hypo-osmolality and hyponatremia: Secondary | ICD-10-CM | POA: Diagnosis not present

## 2018-05-17 DIAGNOSIS — I129 Hypertensive chronic kidney disease with stage 1 through stage 4 chronic kidney disease, or unspecified chronic kidney disease: Secondary | ICD-10-CM | POA: Diagnosis not present

## 2018-05-17 DIAGNOSIS — K219 Gastro-esophageal reflux disease without esophagitis: Secondary | ICD-10-CM | POA: Diagnosis not present

## 2018-05-17 DIAGNOSIS — I482 Chronic atrial fibrillation, unspecified: Secondary | ICD-10-CM | POA: Diagnosis not present

## 2018-05-23 ENCOUNTER — Encounter: Payer: Self-pay | Admitting: Endocrinology

## 2018-05-24 DIAGNOSIS — R3914 Feeling of incomplete bladder emptying: Secondary | ICD-10-CM | POA: Diagnosis not present

## 2018-05-24 DIAGNOSIS — N312 Flaccid neuropathic bladder, not elsewhere classified: Secondary | ICD-10-CM | POA: Diagnosis not present

## 2018-05-24 DIAGNOSIS — R351 Nocturia: Secondary | ICD-10-CM | POA: Diagnosis not present

## 2018-05-24 DIAGNOSIS — N3941 Urge incontinence: Secondary | ICD-10-CM | POA: Diagnosis not present

## 2018-05-25 ENCOUNTER — Encounter: Payer: Medicare HMO | Admitting: Internal Medicine

## 2018-05-28 ENCOUNTER — Ambulatory Visit
Admission: RE | Admit: 2018-05-28 | Discharge: 2018-05-28 | Disposition: A | Discharge status: Home / Self Care | Payer: Medicare HMO | Source: Ambulatory Visit | Attending: Internal Medicine | Admitting: Internal Medicine

## 2018-05-28 ENCOUNTER — Other Ambulatory Visit: Payer: Self-pay | Admitting: Internal Medicine

## 2018-05-28 DIAGNOSIS — R109 Unspecified abdominal pain: Secondary | ICD-10-CM

## 2018-05-28 DIAGNOSIS — E871 Hypo-osmolality and hyponatremia: Secondary | ICD-10-CM | POA: Diagnosis not present

## 2018-05-28 DIAGNOSIS — R1030 Lower abdominal pain, unspecified: Secondary | ICD-10-CM | POA: Diagnosis not present

## 2018-05-28 DIAGNOSIS — K5730 Diverticulosis of large intestine without perforation or abscess without bleeding: Secondary | ICD-10-CM | POA: Diagnosis not present

## 2018-05-28 DIAGNOSIS — I10 Essential (primary) hypertension: Secondary | ICD-10-CM | POA: Diagnosis not present

## 2018-05-28 MED ORDER — IOPAMIDOL (ISOVUE-300) INJECTION 61%
100.0000 mL | Freq: Once | INTRAVENOUS | Status: AC | PRN
  Administered 2018-05-28: 100 mL via INTRAVENOUS

## 2018-05-31 DIAGNOSIS — N3000 Acute cystitis without hematuria: Secondary | ICD-10-CM | POA: Diagnosis not present

## 2018-05-31 DIAGNOSIS — R9389 Abnormal findings on diagnostic imaging of other specified body structures: Secondary | ICD-10-CM | POA: Diagnosis not present

## 2018-06-06 DIAGNOSIS — R9389 Abnormal findings on diagnostic imaging of other specified body structures: Secondary | ICD-10-CM | POA: Diagnosis not present

## 2018-06-06 DIAGNOSIS — N3000 Acute cystitis without hematuria: Secondary | ICD-10-CM | POA: Diagnosis not present

## 2018-06-11 DIAGNOSIS — M545 Low back pain: Secondary | ICD-10-CM | POA: Diagnosis not present

## 2018-06-18 ENCOUNTER — Encounter: Payer: Medicare HMO | Admitting: *Deleted

## 2018-06-18 ENCOUNTER — Telehealth: Payer: Self-pay

## 2018-06-18 NOTE — Telephone Encounter (Signed)
Confirmed remote transmission w/ pt son.    

## 2018-06-19 ENCOUNTER — Other Ambulatory Visit: Payer: Self-pay | Admitting: Orthopedic Surgery

## 2018-06-19 DIAGNOSIS — M545 Low back pain, unspecified: Secondary | ICD-10-CM

## 2018-06-19 DIAGNOSIS — G8929 Other chronic pain: Secondary | ICD-10-CM

## 2018-06-20 ENCOUNTER — Telehealth: Payer: Self-pay

## 2018-06-20 NOTE — Telephone Encounter (Signed)
   [facility] Pre-operative Risk Assessment    Request for surgical clearance:  1. What type of surgery is being performed? Lumbar Myelogram   2. When is this surgery scheduled? TBD   3. What type of clearance is required (medical clearance vs. Pharmacy clearance to hold med vs. Both)? Pharmacy  4. Are there any medications that need to be held prior to surgery and how long? Eliquis...hold for 48 hours only    5. Practice name and name of physician performing surgery? [facility]   6. What is your office phone number [phone]   7.   What is your office fax number        [phone]  8.   Anesthesia type (None, local, MAC, general) ?

## 2018-06-20 NOTE — Telephone Encounter (Signed)
Pt is at low risk for Lumbar Myelogram . She may hold her Eliquis for 48 hours before the procedure She is at low risk for this procedure   Restart the day following the procedure     Mertie Moores, MD  06/20/2018 1:35 PM    [facility] [address],  [place], [place]  [address] Pager [phone] Phone: [phone]; Fax: [phone]

## 2018-06-20 NOTE — Telephone Encounter (Signed)
Follow up  Patient's daughter in law is calling to see when this preop clearance will be sent back to office. She has questions about holds on medications. Please advise.

## 2018-06-22 NOTE — Telephone Encounter (Signed)
Spoke with Joellen Jersey, RN @ [facility], to make her aware that the clearance will be sent over once it's completed, but the provider wanted to make them aware that is their recommendation that pt hold Eliquis 72 hours instead of 48 as they have requested, and once pt has been cleared, they will send it over to them. She thought pt was already cleared, as she had seen Dr. Elmarie Shiley note stating "low risk" and went from there. I explained to her how the Pre op pool works and that the phone note communication wasn't completed at that time and that we would send it to her once it was done. She thanked me for the call.

## 2018-06-22 NOTE — Telephone Encounter (Signed)
   Primary Cardiologist: Mertie Moores, MD  Chart reviewed as part of pre-operative protocol coverage. Patient was contacted 06/22/2018 in reference to pre-operative risk assessment for pending surgery as outlined below.  Donna Ortega was last seen on 5/21 by Dr. Acie Fredrickson and 6/10 by Robbie Lis. She was admitted 01/2018 and managed for PAF in setting of UTI, hyponatremia and dehydration. She converted to NSR prior to DC. Otherwise has h/o CAD s/p CABG (2007), paroxysmal atrial fibrillation/atrial tachycardia, history of tachybradycardia syndrome s/p PPM, GI bleed, diverticulitis, HTN, HLD and chronic hyponatremia. RCRI 0.9% indicating low risk of CV complications from procedure.  I called to speak with patient but son answered. He is not listed on DPR but he gave me an additional # to try. LMOM to call us back. Assuming feeling OK, will likely be cleared.  Of note, I discussed chart with pharmacy to clarify protocol for lumbar myelogram; pharmD agrees usual protocol is to hold 3 days prior to procedure as this is handled the same way as an epidural. I discussed with Dr. Acie Fredrickson given his prior entry in the chart. He also agrees it is prudent to hold for 3 days (72 hours) given age/procedure instead of the 48 hours previously mentioned below. Would recommend to resume as soon as felt safe by physician performing procedure. Patient's son said if I was calling about clearance, they were told the surgeon's office already received everything they need from our office. I will route to the callback staff to call surgeon's office to clarify that we would suggest holding 3 days (72 hours) before procedure, instead of 2 days (48 hours).  Await patient call before faxing final clearance.  Charlie Pitter, PA-C 06/22/2018, 9:30 AM

## 2018-06-22 NOTE — Telephone Encounter (Signed)
   Primary Cardiologist: Mertie Moores, MD  Chart reviewed as part of pre-operative protocol coverage. See notes below. I spoke with patient who affirms no new cardiac concerns, symptoms, CP, SOB, or palpitations. Given past medical history and time since last visit, based on ACC/AHA guidelines, [name] would be at acceptable risk for the planned procedure without further cardiovascular testing.   Regarding anticoagulation: As below, I had discussed chart with pharmacy to clarify protocol for lumbar myelogram; M. Supple, pharmD agrees usual protocol is to hold 3 days prior to procedure as this is handled the same way as an epidural. I took the case to Nahser given his prior entry in the chart. He also agrees it is prudent to hold for 3 days (72 hours) given age/procedure instead of the 48 hours previously mentioned below. Would recommend to resume as soon as felt safe by physician performing procedure. Our callback nurse already called the surgeon's office to update them of the updated recommendation. The patient has also been made aware as well.  I will route this recommendation to the requesting party via Epic fax function and remove from pre-op pool.  Please call with questions.  Donna Pitter, PA-C 06/22/2018, 11:24 AM

## 2018-06-25 ENCOUNTER — Encounter: Payer: Self-pay | Admitting: Cardiology

## 2018-06-26 ENCOUNTER — Ambulatory Visit
Admission: RE | Admit: 2018-06-26 | Discharge: 2018-06-26 | Disposition: A | Discharge status: Home / Self Care | Payer: Medicare HMO | Source: Ambulatory Visit | Attending: Orthopedic Surgery | Admitting: Orthopedic Surgery

## 2018-06-26 ENCOUNTER — Other Ambulatory Visit: Payer: Medicare HMO

## 2018-06-26 DIAGNOSIS — M545 Low back pain, unspecified: Secondary | ICD-10-CM

## 2018-06-26 DIAGNOSIS — M48061 Spinal stenosis, lumbar region without neurogenic claudication: Secondary | ICD-10-CM | POA: Diagnosis not present

## 2018-06-26 DIAGNOSIS — G8929 Other chronic pain: Secondary | ICD-10-CM

## 2018-06-26 MED ORDER — IOPAMIDOL (ISOVUE-M 200) INJECTION 41%
20.0000 mL | Freq: Once | INTRAMUSCULAR | Status: AC
  Administered 2018-06-26: 20 mL via INTRATHECAL

## 2018-06-26 MED ORDER — DIAZEPAM 5 MG PO TABS
5.0000 mg | ORAL_TABLET | Freq: Once | ORAL | Status: AC
  Administered 2018-06-26: 5 mg via ORAL

## 2018-06-26 NOTE — Discharge Instructions (Signed)

## 2018-06-27 ENCOUNTER — Telehealth (HOSPITAL_COMMUNITY): Payer: Self-pay | Admitting: Radiology

## 2018-06-27 NOTE — Telephone Encounter (Signed)
Called pt's son, left VM with [name]'s advice. Asked pt's son [name]) to call me back. JM

## 2018-07-02 ENCOUNTER — Ambulatory Visit: Payer: Medicare HMO | Admitting: Cardiovascular Disease

## 2018-07-02 ENCOUNTER — Telehealth: Payer: Self-pay | Admitting: Cardiovascular Disease

## 2018-07-02 NOTE — Telephone Encounter (Signed)
Sorry to hear of her back fracture.

## 2018-07-02 NOTE — Telephone Encounter (Signed)
New Message   [facility]) Patient just wanted Dr. Acie Fredrickson to know that she fractured her back. She is not sure when she will be back in the office but she just wanted him to be aware.

## 2018-07-03 ENCOUNTER — Other Ambulatory Visit (HOSPITAL_COMMUNITY): Payer: Self-pay | Admitting: Interventional Radiology

## 2018-07-03 ENCOUNTER — Other Ambulatory Visit: Payer: Medicare HMO

## 2018-07-03 DIAGNOSIS — S32000A Wedge compression fracture of unspecified lumbar vertebra, initial encounter for closed fracture: Secondary | ICD-10-CM

## 2018-07-04 NOTE — Telephone Encounter (Signed)
Called patient and advised her that Dr. Acie Fredrickson is aware of her injury and wishes her quick recovery. She states she has an appointment tomorrow to find out if she will have kyphoplasty. I advised her that Dr. Acie Fredrickson says she can be cleared for that procedure if requested. She verbalized understanding and thanked me for the call.

## 2018-07-05 ENCOUNTER — Ambulatory Visit (HOSPITAL_COMMUNITY)
Admission: RE | Admit: 2018-07-05 | Discharge: 2018-07-05 | Disposition: A | Discharge status: Home / Self Care | Payer: Medicare HMO | Source: Ambulatory Visit | Attending: Interventional Radiology | Admitting: Interventional Radiology

## 2018-07-05 ENCOUNTER — Encounter (HOSPITAL_COMMUNITY)
Admission: RE | Admit: 2018-07-05 | Discharge: 2018-07-05 | Disposition: A | Discharge status: Home / Self Care | Payer: Medicare HMO | Source: Ambulatory Visit | Attending: Interventional Radiology | Admitting: Interventional Radiology

## 2018-07-05 DIAGNOSIS — S32000A Wedge compression fracture of unspecified lumbar vertebra, initial encounter for closed fracture: Secondary | ICD-10-CM | POA: Diagnosis not present

## 2018-07-05 DIAGNOSIS — M545 Low back pain: Secondary | ICD-10-CM | POA: Diagnosis not present

## 2018-07-05 MED ORDER — TECHNETIUM TC 99M MEDRONATE IV KIT
20.0000 | PACK | Freq: Once | INTRAVENOUS | Status: AC | PRN
  Administered 2018-07-05: 20 via INTRAVENOUS

## 2018-07-10 ENCOUNTER — Other Ambulatory Visit: Payer: Self-pay | Admitting: Radiology

## 2018-07-10 ENCOUNTER — Telehealth: Payer: Self-pay | Admitting: *Deleted

## 2018-07-10 ENCOUNTER — Other Ambulatory Visit (HOSPITAL_COMMUNITY): Payer: Self-pay | Admitting: Interventional Radiology

## 2018-07-10 DIAGNOSIS — S32020A Wedge compression fracture of second lumbar vertebra, initial encounter for closed fracture: Secondary | ICD-10-CM

## 2018-07-10 NOTE — Telephone Encounter (Signed)
    [facility] Pre-operative Risk Assessment    Request for surgical clearance:  1. What type of surgery is being performed? L2 KYPHOPLASTY   2. When is this surgery scheduled? 07/13/18   3. Are there any medications that need to be held prior to surgery and how long? ELIQUIS 48 HOURS PRIOR   4. Practice name and name of physician performing surgery? [facility];       DR. Willaim Rayas [name]   5. What is your office phone and fax number? PH# [phone]; FAX# [phone]   6. Anesthesia type (None, local, MAC, general) ? MODERATE SEDATION PER JENNIFER AT [facility] 07/10/2018, 9:46 AM  _________________________________________________________________   (provider comments below)

## 2018-07-10 NOTE — Telephone Encounter (Signed)
   Primary Cardiologist: Mertie Moores, MD  Chart reviewed as part of pre-operative protocol coverage.   84 yo female with hx of CAD s/p CABG, tachy brady syndrome s/p pacer, parox AF on Apixaban for anticoagulation, hypertension, hyperlipidemia, chronic hyponatremia.  She needs kyphoplasty for compression fx.  See phone note 07/02/18.  Dr. Acie Fredrickson has noted it is ok for her to proceed.  I will forward to [facility] for anticoagulation recommendations.  Richardson Dopp, PA-C 07/10/2018, 2:53 PM

## 2018-07-10 NOTE — Telephone Encounter (Signed)
Patient with diagnosis of Afib on Eliquis for anticoagulation.    Procedure: L2 Kyphoplasty Date of procedure: 07/13/18  CHADS2-VASc score of  5 (CHF, HTN, AGE, DM2, stroke/tia x 2, CAD, AGE, female)  CrCl 62ml/min  Per office protocol, patient can hold Eliquis for 3 days prior to procedure.

## 2018-07-11 ENCOUNTER — Other Ambulatory Visit: Payer: Self-pay | Admitting: Radiology

## 2018-07-11 NOTE — Telephone Encounter (Signed)
   Primary Cardiologist: Mertie Moores, MD  Chart reviewed as part of pre-operative protocol coverage. Given past medical history and time since last visit, based on ACC/AHA guidelines, Donna Ortega would be at acceptable risk for the planned procedure without further cardiovascular testing.   Patient has a diagnosis of Afib and is on Eliquis for anticoagulation and a CHADS2-VASc score of  5 (CHF, HTN, AGE, DM2, stroke/tia x 2, CAD, AGE, female). Per office protocol, patient can hold Eliquis for 3 days prior to procedure.     Per chart review, Dr. Acie Fredrickson has noted (phone note from 07/02/18) that it is acceptable for her to proceed with the surgery, scheduled for 07/13/18.   I will route this recommendation to the requesting party via Epic fax function and remove from pre-op pool.  Please call with questions.  Kathyrn Drown, NP 07/11/2018, 3:35 PM

## 2018-07-13 ENCOUNTER — Other Ambulatory Visit (HOSPITAL_COMMUNITY): Payer: Self-pay | Admitting: Interventional Radiology

## 2018-07-13 ENCOUNTER — Ambulatory Visit (HOSPITAL_COMMUNITY)
Admission: RE | Admit: 2018-07-13 | Discharge: 2018-07-13 | Disposition: A | Discharge status: Home / Self Care | Payer: Medicare HMO | Source: Ambulatory Visit | Attending: Interventional Radiology | Admitting: Interventional Radiology

## 2018-07-13 ENCOUNTER — Encounter (HOSPITAL_COMMUNITY): Payer: Self-pay

## 2018-07-13 ENCOUNTER — Other Ambulatory Visit: Payer: Self-pay | Admitting: Radiology

## 2018-07-13 DIAGNOSIS — I2510 Atherosclerotic heart disease of native coronary artery without angina pectoris: Secondary | ICD-10-CM | POA: Diagnosis not present

## 2018-07-13 DIAGNOSIS — Z7901 Long term (current) use of anticoagulants: Secondary | ICD-10-CM | POA: Diagnosis not present

## 2018-07-13 DIAGNOSIS — M4316 Spondylolisthesis, lumbar region: Secondary | ICD-10-CM | POA: Insufficient documentation

## 2018-07-13 DIAGNOSIS — Z5309 Procedure and treatment not carried out because of other contraindication: Secondary | ICD-10-CM | POA: Diagnosis not present

## 2018-07-13 DIAGNOSIS — M1990 Unspecified osteoarthritis, unspecified site: Secondary | ICD-10-CM | POA: Diagnosis not present

## 2018-07-13 DIAGNOSIS — S32020A Wedge compression fracture of second lumbar vertebra, initial encounter for closed fracture: Secondary | ICD-10-CM | POA: Insufficient documentation

## 2018-07-13 DIAGNOSIS — E785 Hyperlipidemia, unspecified: Secondary | ICD-10-CM | POA: Diagnosis not present

## 2018-07-13 DIAGNOSIS — Z950 Presence of cardiac pacemaker: Secondary | ICD-10-CM | POA: Insufficient documentation

## 2018-07-13 DIAGNOSIS — M48061 Spinal stenosis, lumbar region without neurogenic claudication: Secondary | ICD-10-CM | POA: Diagnosis not present

## 2018-07-13 DIAGNOSIS — I10 Essential (primary) hypertension: Secondary | ICD-10-CM | POA: Diagnosis not present

## 2018-07-13 DIAGNOSIS — Z951 Presence of aortocoronary bypass graft: Secondary | ICD-10-CM | POA: Diagnosis not present

## 2018-07-13 HISTORY — PX: IR PATIENT EVAL TECH 0-60 MINS: IMG5564

## 2018-07-13 LAB — BASIC METABOLIC PANEL
ANION GAP: 9 (ref 5–15)
BUN: 17 mg/dL (ref 8–23)
CALCIUM: 9.2 mg/dL (ref 8.9–10.3)
CO2: 24 mmol/L (ref 22–32)
Chloride: 99 mmol/L (ref 98–111)
Creatinine, Ser: 0.96 mg/dL (ref 0.44–1.00)
GFR, EST NON AFRICAN AMERICAN: 52 mL/min — AB (ref >60)
GLUCOSE: 90 mg/dL (ref 70–99)
Potassium: 3.9 mmol/L (ref 3.5–5.1)
Sodium: 132 mmol/L — ABNORMAL LOW (ref 135–145)

## 2018-07-13 LAB — PROTIME-INR
INR: 1
Prothrombin Time: 13.1 seconds (ref 11.4–15.2)

## 2018-07-13 LAB — CBC WITH DIFFERENTIAL/PLATELET
Abs Immature Granulocytes: 0.02 10*3/uL (ref 0.00–0.07)
BASOS PCT: 0 %
Basophils Absolute: 0 10*3/uL (ref 0.0–0.1)
EOS ABS: 0.1 10*3/uL (ref 0.0–0.5)
Eosinophils Relative: 1 %
HCT: 40.6 % (ref 36.0–46.0)
Hemoglobin: 13 g/dL (ref 12.0–15.0)
Immature Granulocytes: 0 %
Lymphocytes Relative: 20 %
Lymphs Abs: 1 10*3/uL (ref 0.7–4.0)
MCH: 30.5 pg (ref 26.0–34.0)
MCHC: 32 g/dL (ref 30.0–36.0)
MCV: 95.3 fL (ref 80.0–100.0)
MONO ABS: 0.5 10*3/uL (ref 0.1–1.0)
MONOS PCT: 10 %
Neutro Abs: 3.2 10*3/uL (ref 1.7–7.7)
Neutrophils Relative %: 69 %
PLATELETS: 244 10*3/uL (ref 150–400)
RBC: 4.26 MIL/uL (ref 3.87–5.11)
RDW: 12.9 % (ref 11.5–15.5)
WBC: 4.8 10*3/uL (ref 4.0–10.5)
nRBC: 0 % (ref 0.0–0.2)

## 2018-07-13 LAB — URINALYSIS, COMPLETE (UACMP) WITH MICROSCOPIC
Bilirubin Urine: NEGATIVE
Glucose, UA: NEGATIVE mg/dL
HGB URINE DIPSTICK: NEGATIVE
KETONES UR: NEGATIVE mg/dL
Nitrite: NEGATIVE
PROTEIN: NEGATIVE mg/dL
Specific Gravity, Urine: 1.013 (ref 1.005–1.030)
pH: 7 (ref 5.0–8.0)

## 2018-07-13 MED ORDER — BUPIVACAINE HCL (PF) 0.5 % IJ SOLN
INTRAMUSCULAR | Status: AC
  Filled 2018-07-13: qty 30

## 2018-07-13 MED ORDER — MIDAZOLAM HCL 2 MG/2ML IJ SOLN
INTRAMUSCULAR | Status: AC
  Filled 2018-07-13: qty 2

## 2018-07-13 MED ORDER — CEFAZOLIN SODIUM-DEXTROSE 2-4 GM/100ML-% IV SOLN: 2.0000 g | INTRAVENOUS | Status: DC

## 2018-07-13 MED ORDER — SODIUM CHLORIDE 0.9 % IV SOLN: INTRAVENOUS | Status: DC

## 2018-07-13 MED ORDER — TOBRAMYCIN SULFATE 1.2 G IJ SOLR
INTRAMUSCULAR | Status: AC
  Filled 2018-07-13: qty 1.2

## 2018-07-13 MED ORDER — FENTANYL CITRATE (PF) 100 MCG/2ML IJ SOLN
INTRAMUSCULAR | Status: AC
  Filled 2018-07-13: qty 2

## 2018-07-13 MED ORDER — CEFAZOLIN SODIUM-DEXTROSE 2-4 GM/100ML-% IV SOLN
INTRAVENOUS | Status: AC
  Filled 2018-07-13: qty 100

## 2018-07-13 MED ORDER — IOPAMIDOL (ISOVUE-300) INJECTION 61%
INTRAVENOUS | Status: AC
  Filled 2018-07-13: qty 50

## 2018-07-13 NOTE — Progress Notes (Signed)
Procedure postponed due to evidence of UTI. Rx given for Cipro 250mg  bid X 3 days. Procedure to be rescheduled for Mon 12/2  Pt instructed to take Eliquis today, but then hold on Sat/Sun.  Ascencion Dike PA-C Interventional Radiology 07/13/2018 10:10 AM

## 2018-07-13 NOTE — Progress Notes (Signed)
Patient ID: Donna Ortega, female   DOB: 07-12-1926, 84 y.o.   MRN: [id number] INR. Patient presented for kyphoplasty at L2. UA done reveals hazy urine with trace leukocytes and rare bacteria. In formed patient this is suspicious of a UTI.Marland Kitchen Recommended this be treated prior to proceeding with L2  Kyphoplasty. Patient given a prescription for Cipro. Instructed to take  eliquis today but not on Saturday ,Sunday and Monday. S.Tagen Brethauer MD

## 2018-07-13 NOTE — H&P (Signed)
Chief Complaint: Patient was seen in consultation today for Lumbar 2 kyphoplasty   Referring Physician(s): Dr Lavella Lemons  Supervising Physician: Luanne Bras  Patient Status: [facility] - Out-pt  History of Present Illness: Donna Ortega is a 84 y.o. female   Known to NIR T12 KP 03/2017 Had great pain relief with procedure Now new pain Worsening x weeks Pain meds without relief CT and bone scan  11/21: bone scan: IMPRESSION: Increased tracer uptake at T12 corresponding to prior compression fracture and vertebroplasty. Increased tracer uptake at L2 vertebral body without abnormality on recent CT, question interval compression fracture; recommend radiographic follow-up. Uptake at approximately T9 vertebral body, corresponding to subtle superior endplate compression fracture on prior radiographs  Scheduled now for L2 Kyphoplasty LD Eliquis 3 days ago  Past Medical History:  Diagnosis Date  . Arthritis    osteo   in back  . Coronary artery disease 2007   3 vessel CABG with LIMA-LAD, SVG to diag, and SVG - Ramus  . Diverticulitis   . GI bleeding 09/09/2013  . Hearing loss of left ear 07/2013  . History of atrial fibrillation; review of all ECG are most consistent with atrial tachycardia    had rash with amiodarone; no anticoagulation due to GI bleed in December 2012  . History of GI bleed Dec 2012  . History of hyperkalemia   . Hyperlipidemia   . Hypertension   . Hyponatremia   . Pacemaker-Medtronic 02/09/2012  . Palpitations     Past Surgical History:  Procedure Laterality Date  . ABDOMINAL HYSTERECTOMY    . COLONOSCOPY N/A 09/12/2013   Procedure: COLONOSCOPY;  Surgeon: Wonda Horner, MD;  Location: [facility] ENDOSCOPY;  Service: Endoscopy;  Laterality: N/A;  . CORONARY ARTERY BYPASS GRAFT  Feb 2007   Dr. Cyndia Bent;   . ESOPHAGOGASTRODUODENOSCOPY  08/04/2011   Procedure: ESOPHAGOGASTRODUODENOSCOPY (EGD);  Surgeon: Cleotis Nipper, MD;  Location: [facility] ENDOSCOPY;   Service: Endoscopy;  Laterality: N/A;  do at bedside  . ESOPHAGOGASTRODUODENOSCOPY N/A 09/11/2013   Procedure: ESOPHAGOGASTRODUODENOSCOPY (EGD);  Surgeon: Jeryl Columbia, MD;  Location: [facility] ENDOSCOPY;  Service: Endoscopy;  Laterality: N/A;  . IR KYPHO THORACIC WITH BONE BIOPSY  04/11/2017  . IR RADIOLOGIST EVAL & MGMT  04/04/2017  . MAZE    . pace maker    . PARTIAL HYSTERECTOMY    . PERMANENT PACEMAKER INSERTION N/A 02/08/2012   Procedure: PERMANENT PACEMAKER INSERTION;  Surgeon: Deboraha Sprang, MD;  Location: [facility] CATH LAB;  Service: Cardiovascular;  Laterality: N/A;    Allergies: Amiodarone; Aspirin; Lipitor [atorvastatin calcium]; Nsaids; Tolmetin; Naproxen; Niacin; and Niaspan [niacin er]  Medications: Prior to Admission medications   Medication Sig Start Date End Date Taking? Authorizing Provider  Calcium Carbonate-Vitamin D (CALTRATE 600+D PO) Take 1 tablet by mouth daily.    Yes [provider]  diltiazem (CARDIZEM CD) 360 MG 24 hr capsule Take 1 capsule (360 mg total) by mouth daily. 02/09/18 07/10/18 Yes Hall, Carole N, DO  diphenhydramine-acetaminophen (TYLENOL PM) 25-500 MG TABS tablet Take 2 tablets by mouth at bedtime.   Yes [provider]  ELIQUIS 2.5 MG TABS tablet TAKE 1 TABLET BY MOUTH TWICE A DAY 09/14/17  Yes Nahser, Wonda Cheng, MD  furosemide (LASIX) 20 MG tablet Take 1 tablet (20 mg total) by mouth daily. 02/09/18  Yes Irene Pap N, DO  metoprolol tartrate 75 MG TABS Take 75 mg by mouth 2 (two) times daily. 02/08/18  Yes Kayleen Memos, DO  rosuvastatin (CRESTOR) 10 MG tablet Take 1 tablet (10 mg total) by mouth daily. 07/10/17  Yes Nahser, Wonda Cheng, MD     Family History  Problem Relation Age of Onset  . Heart failure Father   . Kidney failure Father   . Bone cancer Brother   . Kidney failure Sister   . Colon cancer Sister   . Cancer Sister     Social History   Socioeconomic History  . Marital status: Widowed    Spouse name: Not on file  . Number  of children: Not on file  . Years of education: Not on file  . Highest education level: Not on file  Occupational History  . Not on file  Social Needs  . Financial resource strain: Not on file  . Food insecurity:    Worry: Not on file    Inability: Not on file  . Transportation needs:    Medical: Not on file    Non-medical: Not on file  Tobacco Use  . Smoking status: Never Smoker  . Smokeless tobacco: Never Used  Substance and Sexual Activity  . Alcohol use: No  . Drug use: No  . Sexual activity: Never  Lifestyle  . Physical activity:    Days per week: Not on file    Minutes per session: Not on file  . Stress: Not on file  Relationships  . Social connections:    Talks on phone: Not on file    Gets together: Not on file    Attends religious service: Not on file    Active member of club or organization: Not on file    Attends meetings of clubs or organizations: Not on file    Relationship status: Not on file  Other Topics Concern  . Not on file  Social History Narrative   Ambulatory usually independently, plays golf   Lives at home with family   Daughter in law Kasee Hantz [phone]    Review of Systems: A 12 point ROS discussed and pertinent positives are indicated in the HPI above.  All other systems are negative.  Review of Systems  Constitutional: Negative for fever.  HENT: Positive for hearing loss.   Respiratory: Negative for shortness of breath.   Cardiovascular: Negative for chest pain.  Gastrointestinal: Negative for abdominal pain.  Musculoskeletal: Positive for back pain and gait problem.  Neurological: Positive for weakness.  Psychiatric/Behavioral: Negative for behavioral problems and confusion.    Vital Signs: BP 136/65   Pulse 78   Temp 98.4 F (36.9 C)   Resp 18   Ht 5\' 4"  (1.626 m)   Wt 120 lb (54.4 kg)   SpO2 96%   BMI 20.60 kg/m   Physical Exam  Constitutional: She is oriented to person, place, and time.  Cardiovascular: Normal  rate, regular rhythm and normal heart sounds.  Pulmonary/Chest: Effort normal and breath sounds normal.  Abdominal: Soft. Bowel sounds are normal.  Musculoskeletal: Normal range of motion.  Low back pain  Neurological: She is alert and oriented to person, place, and time.  Skin: Skin is warm and dry.  Psychiatric: She has a normal mood and affect. Her behavior is normal. Judgment and thought content normal.  Vitals reviewed.   Imaging: Ct Lumbar Spine W Contrast  Result Date: 06/26/2018 CLINICAL DATA:  Low back pain.  Osteoporosis.  Difficulty walking. EXAM: LUMBAR MYELOGRAM FLUOROSCOPY TIME:  44 seconds corresponding to a Dose Area Product of 211.56 Gy*m2 PROCEDURE: After thorough discussion of risks  and benefits of the procedure including bleeding, infection, injury to nerves, blood vessels, adjacent structures as well as headache and CSF leak, written and oral informed consent was obtained. Consent was obtained by Dr. Rolla Flatten. Time out form was completed. Patient was positioned prone on the fluoroscopy table. Local anesthesia was provided with 1% lidocaine without epinephrine after prepped and draped in the usual sterile fashion. Puncture was performed at L3 using a 3 1/2 inch 22-gauge spinal needle via midline approach. Using a single pass through the dura, the needle was placed within the thecal sac, with return of clear CSF. 15 mL of Isovue M-200 was injected into the thecal sac, with normal opacification of the nerve roots and cauda equina consistent with free flow within the subarachnoid space. I personally performed the lumbar puncture and administered the intrathecal contrast. I also personally supervised acquisition of the myelogram images. TECHNIQUE: Contiguous axial images were obtained through the Lumbar spine after the intrathecal infusion of infusion. Coronal and sagittal reconstructions were obtained of the axial image sets. COMPARISON:  CT abdomen and pelvis 05/28/2018. FINDINGS:  LUMBAR MYELOGRAM FINDINGS: Good opacification lumbar subarachnoid space. Previous T12 and L1 vertebral augmentations. Moderate stenosis at L3-4 and L4-5, multifactorial. BILATERAL L4 and BILATERAL L5 nerve root encroachment, respectively. Mild stenosis at L5-S1, with slight effacement both S1 nerve roots. The patient was too frail to stand for flexion extension radiographs. Lateral view with patient prone for myelography reveals 6 mm anterolisthesis L4 on L5. Moderate superior endplate depression at L4. Near complete loss of vertebral body height at L3, slight retropulsion of the posterior fragment. Minor superior endplate depression at L2 and L5. Significant chronic compression fractures at T12 and L1 status post vertebral augmentation. Osteopenic S1 vertebrae normal height. CT LUMBAR MYELOGRAM FINDINGS: Segmentation: Normal. Alignment: 6 mm anterolisthesis L4 on L5 appears facet mediated. Slight retropulsion L3. Vertebrae: Severe compression fracture of L3, near complete loss vertebral body height centrally.Status post vertebral augmentation T12 and L1, which appear satisfactory. Minor loss of vertebral body height at L2 and L5, and moderate loss of height L4 without significant retropulsion. Severe osteopenia. Conus medullaris: Normal in size and location, terminating L1 Paraspinal tissues: Aortic atherosclerosis.  No masses. Disc levels: L1-L2: Normal disc space. Mild facet arthropathy. Mild epidural fat infolding. No impingement. L2-L3: Mild stenosis due to retropulsed L3 2-3 mm, and posterior element hypertrophy. Borderline L3 neural impingement. L3-L4: Moderate stenosis related to retropulsed L3 2-3 mm, and posterior element hypertrophy. BILATERAL L4 neural impingement. BILATERAL foraminal narrowing likely affects both L3 nerve roots. L4-L5: 6 mm anterolisthesis. Uncovering of the disc with superimposed central protrusion. Posterior element hypertrophy. Severe stenosis. BILATERAL L4 and L5 neural impingement.  L5-S1:  Annular bulge.  Facet arthropathy.  No impingement. IMPRESSION: LUMBAR MYELOGRAM IMPRESSION: Moderate stenosis at L3-4 and L4-5 is multifactorial. BILATERAL L4 and L5 neural impingement, respectively. 6 mm anterolisthesis at L4-5 is facet mediated. Patient could not tolerate upright standing flexion extension views. Multiple compression fractures, described under CT lumbar. CT LUMBAR MYELOGRAM IMPRESSION: Severe compression fracture L3, near complete loss of vertebral body height centrally. Slight retropulsion. Moderate multifactorial stenosis at L3-4 related to retropulsed the in L3 and posterior element hypertrophy with BILATERAL L3 and L4 neural impingement. Severe stenosis at L4-5 with 6 mm facet mediated anterolisthesis, uncovering of the disc with superimposed central protrusion and posterior element hypertrophy. BILATERAL L4 and L5 neural impingement. Chronic T12 and L1 compression fractures, status post vertebral augmentation, without adverse features. Moderate loss of height L4, minor  loss of height L2, and L5, without significant wedging or retropulsion. Electronically Signed   By: Staci Righter M.D.   On: 06/26/2018 15:11   Nm Bone Scan Whole Body  Result Date: 07/05/2018 CLINICAL DATA:  Osteoporosis, new onset low back pain when rising, history of T12 and L1 kyphoplasties, question new compression fracture at L3 or L4 EXAM: NUCLEAR MEDICINE WHOLE BODY BONE SCAN TECHNIQUE: Whole body anterior and posterior images were obtained approximately 3 hours after intravenous injection of radiopharmaceutical. RADIOPHARMACEUTICALS:  21.6 mCi Technetium-60m MDP IV COMPARISON:  None Correlation: CT lumbar spine 06/26/2018, chest radiograph 01/29/2018 FINDINGS: Abnormal increased tracer localization identified transversely in the thoracolumbar spine at L1, L3, and and in the lower thoracic spine at approximately T9 suspect compression fractures. Analysis of prior CT exam demonstrates prior vertebroplasties  at T12 and L1 as well as an old compression fracture of L3. The uptake at approximately L2 does not correspond to a compression fracture on the prior exam, question interval compression deformity. Uptake at the shoulders, feet and wrists, likely degenerative. No additional worrisome sites of osseous tracer accumulation are identified. IMPRESSION: Increased tracer uptake at T12 corresponding to prior compression fracture and vertebroplasty. Increased tracer uptake at L2 vertebral body without abnormality on recent CT, question interval compression fracture; recommend radiographic follow-up. Uptake at approximately T9 vertebral body, corresponding to subtle superior endplate compression fracture on prior radiographs. Electronically Signed   By: Lavonia Dana M.D.   On: 07/05/2018 15:18   Dg Myelography Lumbar Inj Lumbosacral  Result Date: 06/26/2018 CLINICAL DATA:  Low back pain.  Osteoporosis.  Difficulty walking. EXAM: LUMBAR MYELOGRAM FLUOROSCOPY TIME:  44 seconds corresponding to a Dose Area Product of 211.56 Gy*m2 PROCEDURE: After thorough discussion of risks and benefits of the procedure including bleeding, infection, injury to nerves, blood vessels, adjacent structures as well as headache and CSF leak, written and oral informed consent was obtained. Consent was obtained by Dr. Rolla Flatten. Time out form was completed. Patient was positioned prone on the fluoroscopy table. Local anesthesia was provided with 1% lidocaine without epinephrine after prepped and draped in the usual sterile fashion. Puncture was performed at L3 using a 3 1/2 inch 22-gauge spinal needle via midline approach. Using a single pass through the dura, the needle was placed within the thecal sac, with return of clear CSF. 15 mL of Isovue M-200 was injected into the thecal sac, with normal opacification of the nerve roots and cauda equina consistent with free flow within the subarachnoid space. I personally performed the lumbar puncture and  administered the intrathecal contrast. I also personally supervised acquisition of the myelogram images. TECHNIQUE: Contiguous axial images were obtained through the Lumbar spine after the intrathecal infusion of infusion. Coronal and sagittal reconstructions were obtained of the axial image sets. COMPARISON:  CT abdomen and pelvis 05/28/2018. FINDINGS: LUMBAR MYELOGRAM FINDINGS: Good opacification lumbar subarachnoid space. Previous T12 and L1 vertebral augmentations. Moderate stenosis at L3-4 and L4-5, multifactorial. BILATERAL L4 and BILATERAL L5 nerve root encroachment, respectively. Mild stenosis at L5-S1, with slight effacement both S1 nerve roots. The patient was too frail to stand for flexion extension radiographs. Lateral view with patient prone for myelography reveals 6 mm anterolisthesis L4 on L5. Moderate superior endplate depression at L4. Near complete loss of vertebral body height at L3, slight retropulsion of the posterior fragment. Minor superior endplate depression at L2 and L5. Significant chronic compression fractures at T12 and L1 status post vertebral augmentation. Osteopenic S1 vertebrae normal height. CT LUMBAR  MYELOGRAM FINDINGS: Segmentation: Normal. Alignment: 6 mm anterolisthesis L4 on L5 appears facet mediated. Slight retropulsion L3. Vertebrae: Severe compression fracture of L3, near complete loss vertebral body height centrally.Status post vertebral augmentation T12 and L1, which appear satisfactory. Minor loss of vertebral body height at L2 and L5, and moderate loss of height L4 without significant retropulsion. Severe osteopenia. Conus medullaris: Normal in size and location, terminating L1 Paraspinal tissues: Aortic atherosclerosis.  No masses. Disc levels: L1-L2: Normal disc space. Mild facet arthropathy. Mild epidural fat infolding. No impingement. L2-L3: Mild stenosis due to retropulsed L3 2-3 mm, and posterior element hypertrophy. Borderline L3 neural impingement. L3-L4:  Moderate stenosis related to retropulsed L3 2-3 mm, and posterior element hypertrophy. BILATERAL L4 neural impingement. BILATERAL foraminal narrowing likely affects both L3 nerve roots. L4-L5: 6 mm anterolisthesis. Uncovering of the disc with superimposed central protrusion. Posterior element hypertrophy. Severe stenosis. BILATERAL L4 and L5 neural impingement. L5-S1:  Annular bulge.  Facet arthropathy.  No impingement. IMPRESSION: LUMBAR MYELOGRAM IMPRESSION: Moderate stenosis at L3-4 and L4-5 is multifactorial. BILATERAL L4 and L5 neural impingement, respectively. 6 mm anterolisthesis at L4-5 is facet mediated. Patient could not tolerate upright standing flexion extension views. Multiple compression fractures, described under CT lumbar. CT LUMBAR MYELOGRAM IMPRESSION: Severe compression fracture L3, near complete loss of vertebral body height centrally. Slight retropulsion. Moderate multifactorial stenosis at L3-4 related to retropulsed the in L3 and posterior element hypertrophy with BILATERAL L3 and L4 neural impingement. Severe stenosis at L4-5 with 6 mm facet mediated anterolisthesis, uncovering of the disc with superimposed central protrusion and posterior element hypertrophy. BILATERAL L4 and L5 neural impingement. Chronic T12 and L1 compression fractures, status post vertebral augmentation, without adverse features. Moderate loss of height L4, minor loss of height L2, and L5, without significant wedging or retropulsion. Electronically Signed   By: Staci Righter M.D.   On: 06/26/2018 15:11    Labs:  CBC: Recent Labs    02/05/18 0422 02/06/18 0448 02/07/18 0151 07/13/18 0700  WBC 9.1 10.7* 8.8 4.8  HGB 12.1 13.9 12.6 13.0  HCT 34.3* 38.6 36.6 40.6  PLT 215 243 228 244    COAGS: Recent Labs    01/29/18 1343 07/13/18 0700  INR 0.96 1.00    BMP: Recent Labs    02/05/18 1648 02/06/18 0448 02/06/18 1744 02/07/18 0151 02/08/18 0429  NA 124* 124* 129* 130* 131*  K 3.4* 4.5  --  4.5  4.2  CL 88* 89*  --  97* 97*  CO2 27 25  --  25 23  GLUCOSE 189* 109*  --  118* 115*  BUN 25* 23  --  30* 32*  CALCIUM 8.8* 9.1  --  9.0 9.2  CREATININE 0.94 0.71  --  0.99 0.94  GFRNONAA 52* >60  --  49* 52*  GFRAA >60 >60  --  57* >60    LIVER FUNCTION TESTS: Recent Labs    02/04/18 0359 02/05/18 0422 02/06/18 0448 02/07/18 0151  BILITOT 0.7 0.4 0.5 0.4  AST 19 20 22 21   ALT 17 20 22 22   ALKPHOS 45 51 59 53  PROT 5.9* 6.2* 6.9 6.3*  ALBUMIN 3.4* 3.5 3.9 3.6    TUMOR MARKERS: No results for input(s): AFPTM, CEA, CA199, CHROMGRNA in the last 8760 hours.  Assessment and Plan:  Lumbar 2 painful acute fracture Scheduled for L2 Kyphoplasty Risks and benefits of Lumbar 2 Kyphoplasty were discussed with the patient including, but not limited to education regarding the natural healing  process of compression fractures without intervention, bleeding, infection, cement migration which may cause spinal cord damage, paralysis, pulmonary embolism or even death.  This interventional procedure involves the use of X-rays and because of the nature of the planned procedure, it is possible that we will have prolonged use of X-ray fluoroscopy.  Potential radiation risks to you include (but are not limited to) the following: - A slightly elevated risk for cancer  several years later in life. This risk is typically less than 0.5% percent. This risk is low in comparison to the normal incidence of human cancer, which is 33% for women and 50% for men according to the [place]. - Radiation induced injury can include skin redness, resembling a rash, tissue breakdown / ulcers and hair loss (which can be temporary or permanent).   The likelihood of either of these occurring depends on the difficulty of the procedure and whether you are sensitive to radiation due to previous procedures, disease, or genetic conditions.   IF your procedure requires a prolonged use of radiation, you will  be notified and given written instructions for further action.  It is your responsibility to monitor the irradiated area for the 2 weeks following the procedure and to notify your physician if you are concerned that you have suffered a radiation induced injury.    All of the patient's questions were answered, patient is agreeable to proceed.  Consent signed and in chart.  Thank you for this interesting consult.  I greatly enjoyed meeting JACILYN SANPEDRO and look forward to participating in their care.  A copy of this report was sent to the requesting provider on this date.  Electronically Signed: Lavonia Drafts, PA-C 07/13/2018, 7:51 AM   I spent a total of    25 Minutes in face to face in clinical consultation, greater than 50% of which was counseling/coordinating care for L2 KP

## 2018-07-13 NOTE — Discharge Instructions (Signed)
Moderate Conscious Sedation, Adult, Care After °These instructions provide you with information about caring for yourself after your procedure. Your health care provider may also give you more specific instructions. Your treatment has been planned according to current medical practices, but problems sometimes occur. Call your health care provider if you have any problems or questions after your procedure. °What can I expect after the procedure? °After your procedure, it is common: °· To feel sleepy for several hours. °· To feel clumsy and have poor balance for several hours. °· To have poor judgment for several hours. °· To vomit if you eat too soon. ° °Follow these instructions at home: °For at least 24 hours after the procedure: ° °· Do not: °? Participate in activities where you could fall or become injured. °? Drive. °? Use heavy machinery. °? Drink alcohol. °? Take sleeping pills or medicines that cause drowsiness. °? Make important decisions or sign legal documents. °? Take care of children on your own. °· Rest. °Eating and drinking °· Follow the diet recommended by your health care provider. °· If you vomit: °? Drink water, juice, or soup when you can drink without vomiting. °? Make sure you have little or no nausea before eating solid foods. °General instructions °· Have a responsible adult stay with you until you are awake and alert. °· Take over-the-counter and prescription medicines only as told by your health care provider. °· If you smoke, do not smoke without supervision. °· Keep all follow-up visits as told by your health care provider. This is important. °Contact a health care provider if: °· You keep feeling nauseous or you keep vomiting. °· You feel light-headed. °· You develop a rash. °· You have a fever. °Get help right away if: °· You have trouble breathing. °This information is not intended to replace advice given to you by your health care provider. Make sure you discuss any questions you have  with your health care provider. °Document Released: 05/22/2013 Document Revised: 01/04/2016 Document Reviewed: 11/21/2015 °Elsevier Interactive Patient Education © 2018 [employer] °Balloon Kyphoplasty, Care After °Refer to this sheet in the next few weeks. These instructions provide you with information about caring for yourself after your procedure. Your health care provider may also give you more specific instructions. Your treatment has been planned according to current medical practices, but problems sometimes occur. Call your health care provider if you have any problems or questions after your procedure. °What can I expect after the procedure? °After your procedure, it is common to have back pain. °Follow these instructions at home: °Incision care °· Follow instructions from your health care provider about how to take care of your incisions. Make sure you: °? Wash your hands with soap and water before you change your bandage (dressing). If soap and water are not available, use hand sanitizer. °? Change your dressing as told by your health care provider. °? Leave stitches (sutures), skin glue, or adhesive strips in place. These skin closures may need to be in place for 2 weeks or longer. If adhesive strip edges start to loosen and curl up, you may trim the loose edges. Do not remove adhesive strips completely unless your health care provider tells you to do that. °· Check your incision area every day for signs of infection. Watch for: °? Redness, swelling, or pain. °? Fluid, blood, or pus. °· Keep your dressing dry until your health care provider says that it can be removed. °Activity ° °· Rest your back and   avoid intense physical activity for as long as told by your health care provider.  Return to your normal activities as told by your health care provider. Ask your health care provider what activities are safe for you.  Do not lift anything that is heavier than 10 lb (4.5 kg). This is about the weight  of a gallon of milk.You may need to avoid heavy lifting for several weeks. General instructions  Take over-the-counter and prescription medicines only as told by your health care provider.  If directed, apply ice to the painful area: ? Put ice in a plastic bag. ? Place a towel between your skin and the bag. ? Leave the ice on for 20 minutes, 2-3 times per day.  Do not use tobacco products, including cigarettes, chewing tobacco, or e-cigarettes. If you need help quitting, ask your health care provider.  Keep all follow-up visits as told by your health care provider. This is important. Contact a health care provider if:  You have a fever.  You have redness, swelling, or pain at the site of your incisions.  You have fluid, blood, or pus coming from your incisions.  You have pain that gets worse or does not get better with medicine.  You develop numbness or weakness in any part of your body. Get help right away if:  You have chest pain.  You have difficulty breathing.  You cannot move your legs.  You cannot control your bladder or bowel movements.  You suddenly become weak or numb on one side of your body.  You become very confused.  You have trouble speaking or understanding, or both. This information is not intended to replace advice given to you by your health care provider. Make sure you discuss any questions you have with your health care provider. Document Released: 04/22/2015 Document Revised: 01/07/2016 Document Reviewed: 11/24/2014 Elsevier Interactive Patient Education  2018 [place]. Moderate Conscious Sedation, Adult, Care After These instructions provide you with information about caring for yourself after your procedure. Your health care provider may also give you more specific instructions. Your treatment has been planned according to current medical practices, but problems sometimes occur. Call your health care provider if you have any problems or  questions after your procedure. What can I expect after the procedure? After your procedure, it is common:  To feel sleepy for several hours.  To feel clumsy and have poor balance for several hours.  To have poor judgment for several hours.  To vomit if you eat too soon.  Follow these instructions at home: For at least 24 hours after the procedure:   Do not: ? Participate in activities where you could fall or become injured. ? Drive. ? Use heavy machinery. ? Drink alcohol. ? Take sleeping pills or medicines that cause drowsiness. ? Make important decisions or sign legal documents. ? Take care of children on your own.  Rest. Eating and drinking  Follow the diet recommended by your health care provider.  If you vomit: ? Drink water, juice, or soup when you can drink without vomiting. ? Make sure you have little or no nausea before eating solid foods. General instructions  Have a responsible adult stay with you until you are awake and alert.  Take over-the-counter and prescription medicines only as told by your health care provider.  If you smoke, do not smoke without supervision.  Keep all follow-up visits as told by your health care provider. This is important. Contact a health care provider if:  You keep feeling nauseous or you keep vomiting.  You feel light-headed.  You develop a rash.  You have a fever. Get help right away if:  You have trouble breathing. This information is not intended to replace advice given to you by your health care provider. Make sure you discuss any questions you have with your health care provider. Document Released: 05/22/2013 Document Revised: 01/04/2016 Document Reviewed: 11/21/2015 Elsevier Interactive Patient Education  2018 [place]. Balloon Kyphoplasty, Care After Refer to this sheet in the next few weeks. These instructions provide you with information about caring for yourself after your procedure. Your health care  provider may also give you more specific instructions. Your treatment has been planned according to current medical practices, but problems sometimes occur. Call your health care provider if you have any problems or questions after your procedure. What can I expect after the procedure? After your procedure, it is common to have back pain. Follow these instructions at home: Incision care  Follow instructions from your health care provider about how to take care of your incisions. Make sure you: ? Wash your hands with soap and water before you change your bandage (dressing). If soap and water are not available, use hand sanitizer. ? Change your dressing as told by your health care provider. ? Leave stitches (sutures), skin glue, or adhesive strips in place. These skin closures may need to be in place for 2 weeks or longer. If adhesive strip edges start to loosen and curl up, you may trim the loose edges. Do not remove adhesive strips completely unless your health care provider tells you to do that.  Check your incision area every day for signs of infection. Watch for: ? Redness, swelling, or pain. ? Fluid, blood, or pus.  Keep your dressing dry until your health care provider says that it can be removed. Activity   Rest your back and avoid intense physical activity for as long as told by your health care provider.  Return to your normal activities as told by your health care provider. Ask your health care provider what activities are safe for you.  Do not lift anything that is heavier than 10 lb (4.5 kg). This is about the weight of a gallon of milk.You may need to avoid heavy lifting for several weeks. General instructions  Take over-the-counter and prescription medicines only as told by your health care provider.  If directed, apply ice to the painful area: ? Put ice in a plastic bag. ? Place a towel between your skin and the bag. ? Leave the ice on for 20 minutes, 2-3 times per  day.  Do not use tobacco products, including cigarettes, chewing tobacco, or e-cigarettes. If you need help quitting, ask your health care provider.  Keep all follow-up visits as told by your health care provider. This is important. Contact a health care provider if:  You have a fever.  You have redness, swelling, or pain at the site of your incisions.  You have fluid, blood, or pus coming from your incisions.  You have pain that gets worse or does not get better with medicine.  You develop numbness or weakness in any part of your body. Get help right away if:  You have chest pain.  You have difficulty breathing.  You cannot move your legs.  You cannot control your bladder or bowel movements.  You suddenly become weak or numb on one side of your body.  You become very confused.  You have  trouble speaking or understanding, or both. This information is not intended to replace advice given to you by your health care provider. Make sure you discuss any questions you have with your health care provider. Document Released: 04/22/2015 Document Revised: 01/07/2016 Document Reviewed: 11/24/2014 Elsevier Interactive Patient Education  [employer].

## 2018-07-13 NOTE — Procedures (Signed)
Patient brought into IR room for Kyphpoplasty procedure.  Urine results were indicative of infection.  Therefore, decision made to reschedule procedure by Dr. Estanislado Pandy.

## 2018-07-16 ENCOUNTER — Encounter (HOSPITAL_COMMUNITY): Payer: Self-pay

## 2018-07-16 ENCOUNTER — Ambulatory Visit (HOSPITAL_COMMUNITY)
Admission: RE | Admit: 2018-07-16 | Discharge: 2018-07-16 | Disposition: A | Discharge status: Home / Self Care | Payer: Medicare HMO | Source: Ambulatory Visit | Attending: Interventional Radiology | Admitting: Interventional Radiology

## 2018-07-16 DIAGNOSIS — M899 Disorder of bone, unspecified: Secondary | ICD-10-CM | POA: Diagnosis not present

## 2018-07-16 DIAGNOSIS — Z9889 Other specified postprocedural states: Secondary | ICD-10-CM | POA: Diagnosis not present

## 2018-07-16 DIAGNOSIS — Z79899 Other long term (current) drug therapy: Secondary | ICD-10-CM | POA: Insufficient documentation

## 2018-07-16 DIAGNOSIS — I4891 Unspecified atrial fibrillation: Secondary | ICD-10-CM | POA: Diagnosis not present

## 2018-07-16 DIAGNOSIS — Z951 Presence of aortocoronary bypass graft: Secondary | ICD-10-CM | POA: Diagnosis not present

## 2018-07-16 DIAGNOSIS — Z886 Allergy status to analgesic agent status: Secondary | ICD-10-CM | POA: Insufficient documentation

## 2018-07-16 DIAGNOSIS — Z8249 Family history of ischemic heart disease and other diseases of the circulatory system: Secondary | ICD-10-CM | POA: Insufficient documentation

## 2018-07-16 DIAGNOSIS — E785 Hyperlipidemia, unspecified: Secondary | ICD-10-CM | POA: Insufficient documentation

## 2018-07-16 DIAGNOSIS — X58XXXA Exposure to other specified factors, initial encounter: Secondary | ICD-10-CM | POA: Diagnosis not present

## 2018-07-16 DIAGNOSIS — Z90711 Acquired absence of uterus with remaining cervical stump: Secondary | ICD-10-CM | POA: Diagnosis not present

## 2018-07-16 DIAGNOSIS — I10 Essential (primary) hypertension: Secondary | ICD-10-CM | POA: Diagnosis not present

## 2018-07-16 DIAGNOSIS — Z888 Allergy status to other drugs, medicaments and biological substances status: Secondary | ICD-10-CM | POA: Diagnosis not present

## 2018-07-16 DIAGNOSIS — Z950 Presence of cardiac pacemaker: Secondary | ICD-10-CM | POA: Diagnosis not present

## 2018-07-16 DIAGNOSIS — S32020A Wedge compression fracture of second lumbar vertebra, initial encounter for closed fracture: Secondary | ICD-10-CM | POA: Insufficient documentation

## 2018-07-16 DIAGNOSIS — I2510 Atherosclerotic heart disease of native coronary artery without angina pectoris: Secondary | ICD-10-CM | POA: Diagnosis not present

## 2018-07-16 DIAGNOSIS — Z7901 Long term (current) use of anticoagulants: Secondary | ICD-10-CM | POA: Insufficient documentation

## 2018-07-16 DIAGNOSIS — M1990 Unspecified osteoarthritis, unspecified site: Secondary | ICD-10-CM | POA: Diagnosis not present

## 2018-07-16 DIAGNOSIS — M898X8 Other specified disorders of bone, other site: Secondary | ICD-10-CM | POA: Diagnosis not present

## 2018-07-16 DIAGNOSIS — M4856XA Collapsed vertebra, not elsewhere classified, lumbar region, initial encounter for fracture: Secondary | ICD-10-CM | POA: Diagnosis not present

## 2018-07-16 HISTORY — PX: IR KYPHO LUMBAR INC FX REDUCE BONE BX UNI/BIL CANNULATION INC/IMAGING: IMG5519

## 2018-07-16 MED ORDER — IOPAMIDOL (ISOVUE-300) INJECTION 61%
INTRAVENOUS | Status: AC
  Administered 2018-07-16: 10 mL
  Filled 2018-07-16: qty 50

## 2018-07-16 MED ORDER — GELATIN ABSORBABLE 12-7 MM EX MISC
CUTANEOUS | Status: AC
  Filled 2018-07-16: qty 1

## 2018-07-16 MED ORDER — MIDAZOLAM HCL 2 MG/2ML IJ SOLN
INTRAMUSCULAR | Status: AC
  Filled 2018-07-16: qty 2

## 2018-07-16 MED ORDER — BUPIVACAINE HCL (PF) 0.5 % IJ SOLN
INTRAMUSCULAR | Status: AC | PRN
  Administered 2018-07-16: 20 mL

## 2018-07-16 MED ORDER — CEFAZOLIN SODIUM-DEXTROSE 2-4 GM/100ML-% IV SOLN
2.0000 g | INTRAVENOUS | Status: AC
  Administered 2018-07-16: 2 g via INTRAVENOUS

## 2018-07-16 MED ORDER — FENTANYL CITRATE (PF) 100 MCG/2ML IJ SOLN
INTRAMUSCULAR | Status: AC | PRN
  Administered 2018-07-16 (×2): 25 ug via INTRAVENOUS

## 2018-07-16 MED ORDER — SODIUM CHLORIDE 0.9 % IV SOLN: INTRAVENOUS | Status: DC

## 2018-07-16 MED ORDER — TOBRAMYCIN SULFATE 1.2 G IJ SOLR
INTRAMUSCULAR | Status: AC | PRN
  Administered 2018-07-16: .01 g via TOPICAL

## 2018-07-16 MED ORDER — MIDAZOLAM HCL 2 MG/2ML IJ SOLN
INTRAMUSCULAR | Status: AC | PRN
  Administered 2018-07-16: 1 mg via INTRAVENOUS

## 2018-07-16 MED ORDER — CEFAZOLIN SODIUM-DEXTROSE 2-4 GM/100ML-% IV SOLN
INTRAVENOUS | Status: AC
  Administered 2018-07-16: 2 g via INTRAVENOUS
  Filled 2018-07-16: qty 100

## 2018-07-16 MED ORDER — TOBRAMYCIN SULFATE 1.2 G IJ SOLR
INTRAMUSCULAR | Status: AC
  Filled 2018-07-16: qty 1.2

## 2018-07-16 MED ORDER — FENTANYL CITRATE (PF) 100 MCG/2ML IJ SOLN
INTRAMUSCULAR | Status: AC
  Filled 2018-07-16: qty 2

## 2018-07-16 MED ORDER — SODIUM CHLORIDE 0.9 % IV SOLN: INTRAVENOUS | Status: AC

## 2018-07-16 MED ORDER — BUPIVACAINE HCL (PF) 0.5 % IJ SOLN
INTRAMUSCULAR | Status: AC
  Filled 2018-07-16: qty 30

## 2018-07-16 NOTE — Discharge Instructions (Signed)
1. No stooping,bending or lifting more than 10 lbs for 2 weeks. 2.Use walker to ambulate for 2 weeks. 3.No driving for 2 weeks. 4.RTC PRN 2 weeks 5. Cardizem has expired, please contact provider for prescription.   Balloon Kyphoplasty, Care After Refer to this sheet in the next few weeks. These instructions provide you with information about caring for yourself after your procedure. Your health care provider may also give you more specific instructions. Your treatment has been planned according to current medical practices, but problems sometimes occur. Call your health care provider if you have any problems or questions after your procedure. What can I expect after the procedure? After your procedure, it is common to have back pain. Follow these instructions at home: Incision care  Follow instructions from your health care provider about how to take care of your incisions. Make sure you: ? Wash your hands with soap and water before you change your bandage (dressing). If soap and water are not available, use hand sanitizer. ? Remove dressing in 24-48 hours. Then you may shower.   Check your incision area every day for signs of infection. Watch for: ? Redness, swelling, or pain. ? Fluid, blood, or pus.  Keep your dressing dry until your health care provider says that it can be removed. Activity   Rest your back and avoid intense physical activity for as long as told by your health care provider.  Return to your normal activities as told by your health care provider. Ask your health care provider what activities are safe for you.  Do not lift anything that is heavier than 10 lb (4.5 kg). This is about the weight of a gallon of milk.You may need to avoid heavy lifting for several weeks. General instructions  Take over-the-counter and prescription medicines only as told by your health care provider.  If directed, apply ice to the painful area: ? Put ice in a plastic bag. ? Place a  towel between your skin and the bag. ? Leave the ice on for 20 minutes, 2-3 times per day.  Do not use tobacco products, including cigarettes, chewing tobacco, or e-cigarettes. If you need help quitting, ask your health care provider.  Keep all follow-up visits as told by your health care provider. This is important. Contact a health care provider if:  You have a fever.  You have redness, swelling, or pain at the site of your incisions.  You have fluid, blood, or pus coming from your incisions.  You have pain that gets worse or does not get better with medicine.  You develop numbness or weakness in any part of your body. Get help right away if:  You have chest pain.  You have difficulty breathing.  You cannot move your legs.  You cannot control your bladder or bowel movements.  You suddenly become weak or numb on one side of your body.  You become very confused.  You have trouble speaking or understanding, or both. This information is not intended to replace advice given to you by your health care provider. Make sure you discuss any questions you have with your health care provider. Document Released: 04/22/2015 Document Revised: 01/07/2016 Document Reviewed: 11/24/2014 Elsevier Interactive Patient Education  [employer].

## 2018-07-16 NOTE — H&P (Signed)
Chief Complaint: Patient was seen in consultation today for L2 compression fracture.  Referring Physician(s): Latanya Maudlin  Supervising Physician: Luanne Bras  Patient Status: [facility] - Out-pt  History of Present Illness: Donna Ortega is a 84 y.o. female with a past medical history of hypertension, hyperlipidemia, atrial fibrillation on chronic anticoagulation with Eliquis, CAD, s/p pacemaker, GI bleed, diverticulitis, and arthritis. She is known to [place] and has undergone procedures by Dr. Estanislado Pandy. She underwent an image-guided L1 kyphoplasty 08/07/2018 by Dr. Estanislado Pandy. She also underwent an image-guided T12 kyphoplasty 04/11/2018 by Dr. Estanislado Pandy. She has had worsening back pain for weeks and was found to have a new compression fracture.  NM bone scan 07/05/2018: 1. Increased tracer uptake at T12 corresponding to prior compression fracture and vertebroplasty. 2. Increased tracer uptake at L2 vertebral body without abnormality on recent CT, question interval compression fracture; recommend radiographic follow-up. 3. Uptake at approximately T9 vertebral body, corresponding to subtle superior endplate compression fracture on prior radiographs.  IR requested by Dr. Gladstone Lighter for possible image-guided L2 kyphoplasty/vertebroplasty. Patient was tentatively scheduled for procedure 07/13/2018. However, she was found to have bacteria on UA and procedure was postponed until today. She was given Ciprofloxacin 250 mg BID x 3 days, which she has completed at this time. Ok to proceed per Dr. Estanislado Pandy. Patient awake and alert laying in bed. Complains of low back pain. Rates pain 4-5/10 when moving. States her pain is 0 at this time, as she is laying flat. Denies fever, chills, chest pain, dyspnea, abdominal pain, headache, numbness/tingling down legs, bladder/bowel incontinence, or urinary symptoms including dysuria or frequency.  Patient is currently taking Eliquis- reports last dose  07/13/2018.   Past Medical History:  Diagnosis Date  . Arthritis    osteo   in back  . Coronary artery disease 2007   3 vessel CABG with LIMA-LAD, SVG to diag, and SVG - Ramus  . Diverticulitis   . GI bleeding 09/09/2013  . Hearing loss of left ear 07/2013  . History of atrial fibrillation; review of all ECG are most consistent with atrial tachycardia    had rash with amiodarone; no anticoagulation due to GI bleed in December 2012  . History of GI bleed Dec 2012  . History of hyperkalemia   . Hyperlipidemia   . Hypertension   . Hyponatremia   . Pacemaker-[employer] 02/09/2012  . Palpitations     Past Surgical History:  Procedure Laterality Date  . ABDOMINAL HYSTERECTOMY    . COLONOSCOPY N/A 09/12/2013   Procedure: COLONOSCOPY;  Surgeon: Wonda Horner, MD;  Location[facility] ENDOSCOPY;  Service: Endoscopy;  Laterality: N/A;  . CORONARY ARTERY BYPASS GRAFT  Feb 2007   Dr. Cyndia Bent;   . ESOPHAGOGASTRODUODENOSCOPY  08/04/2011   Procedure: ESOPHAGOGASTRODUODENOSCOPY (EGD);  Surgeon: Cleotis Nipper, MD;  Location: [facility] ENDOSCOPY;  Service: Endoscopy;  Laterality: N/A;  do at bedside  . ESOPHAGOGASTRODUODENOSCOPY N/A 09/11/2013   Procedure: ESOPHAGOGASTRODUODENOSCOPY (EGD);  Surgeon: Jeryl Columbia, MD;  Location: [facility] ENDOSCOPY;  Service: Endoscopy;  Laterality: N/A;  . IR KYPHO THORACIC WITH BONE BIOPSY  04/11/2017  . IR PATIENT EVAL TECH 0-60 MINS  07/13/2018  . IR RADIOLOGIST EVAL & MGMT  04/04/2017  . MAZE    . pace maker    . PARTIAL HYSTERECTOMY    . PERMANENT PACEMAKER INSERTION N/A 02/08/2012   Procedure: PERMANENT PACEMAKER INSERTION;  Surgeon: Deboraha Sprang, MD;  Location: [facility] CATH LAB;  Service: Cardiovascular;  Laterality: N/A;  Allergies: Amiodarone; Aspirin; Lipitor [atorvastatin calcium]; Nsaids; Tolmetin; Naproxen; Niacin; and Niaspan [niacin er]  Medications: Prior to Admission medications   Medication Sig Start Date End Date Taking? Authorizing Provider  Calcium  Carbonate-Vitamin D (CALTRATE 600+D PO) Take 1 tablet by mouth daily.    Yes [provider]  diphenhydramine-acetaminophen (TYLENOL PM) 25-500 MG TABS tablet Take 2 tablets by mouth at bedtime.   Yes [provider]  furosemide (LASIX) 20 MG tablet Take 1 tablet (20 mg total) by mouth daily. 02/09/18  Yes Irene Pap N, DO  metoprolol tartrate 75 MG TABS Take 75 mg by mouth 2 (two) times daily. 02/08/18  Yes Irene Pap N, DO  rosuvastatin (CRESTOR) 10 MG tablet Take 1 tablet (10 mg total) by mouth daily. 07/10/17  Yes Nahser, Wonda Cheng, MD  diltiazem (CARDIZEM CD) 360 MG 24 hr capsule Take 1 capsule (360 mg total) by mouth daily. 02/09/18 07/10/18  Kayleen Memos, DO  ELIQUIS 2.5 MG TABS tablet TAKE 1 TABLET BY MOUTH TWICE A DAY 09/14/17   Nahser, Wonda Cheng, MD     Family History  Problem Relation Age of Onset  . Heart failure Father   . Kidney failure Father   . Bone cancer Brother   . Kidney failure Sister   . Colon cancer Sister   . Cancer Sister     Social History   Socioeconomic History  . Marital status: Widowed    Spouse name: Not on file  . Number of children: Not on file  . Years of education: Not on file  . Highest education level: Not on file  Occupational History  . Not on file  Social Needs  . Financial resource strain: Not on file  . Food insecurity:    Worry: Not on file    Inability: Not on file  . Transportation needs:    Medical: Not on file    Non-medical: Not on file  Tobacco Use  . Smoking status: Never Smoker  . Smokeless tobacco: Never Used  Substance and Sexual Activity  . Alcohol use: No  . Drug use: No  . Sexual activity: Never  Lifestyle  . Physical activity:    Days per week: Not on file    Minutes per session: Not on file  . Stress: Not on file  Relationships  . Social connections:    Talks on phone: Not on file    Gets together: Not on file    Attends religious service: Not on file    Active member of club or  organization: Not on file    Attends meetings of clubs or organizations: Not on file    Relationship status: Not on file  Other Topics Concern  . Not on file  Social History Narrative   Ambulatory usually independently, plays golf   Lives at home with family   Daughter in law Sumedha Munnerlyn [phone]     Review of Systems: A 12 point ROS discussed and pertinent positives are indicated in the HPI above.  All other systems are negative.  Review of Systems  Constitutional: Negative for chills and fever.  Respiratory: Negative for shortness of breath and wheezing.   Cardiovascular: Negative for chest pain and palpitations.  Gastrointestinal: Negative for abdominal pain.       Negative for bowel incontinence.  Genitourinary: Negative for dysuria and frequency.       Negative for bladder incontinence.  Musculoskeletal: Positive for back pain.  Neurological: Negative for numbness and  headaches.  Psychiatric/Behavioral: Negative for behavioral problems and confusion.    Vital Signs: BP 138/73   Pulse 71   Temp 97.7 F (36.5 C) (Oral)   Resp 16   Ht 5\' 4"  (1.626 m)   Wt 110 lb (49.9 kg)   SpO2 97%   BMI 18.88 kg/m   Physical Exam  Constitutional: She is oriented to person, place, and time. She appears well-developed and well-nourished. No distress.  Cardiovascular: Normal rate, regular rhythm and normal heart sounds.  No murmur heard. Pulmonary/Chest: Effort normal and breath sounds normal. No respiratory distress. She has no wheezes.  Musculoskeletal:  Mild midline back pain at approximate level of L2.  Neurological: She is alert and oriented to person, place, and time.  Skin: Skin is warm and dry.  Psychiatric: She has a normal mood and affect. Her behavior is normal. Judgment and thought content normal.  Nursing note and vitals reviewed.    MD Evaluation Airway: WNL Heart: WNL Abdomen: WNL Chest/ Lungs: WNL ASA  Classification: 3 Mallampati/Airway Score:  Two   Imaging: Ct Lumbar Spine W Contrast  Result Date: 06/26/2018 CLINICAL DATA:  Low back pain.  Osteoporosis.  Difficulty walking. EXAM: LUMBAR MYELOGRAM FLUOROSCOPY TIME:  44 seconds corresponding to a Dose Area Product of 211.56 Gy*m2 PROCEDURE: After thorough discussion of risks and benefits of the procedure including bleeding, infection, injury to nerves, blood vessels, adjacent structures as well as headache and CSF leak, written and oral informed consent was obtained. Consent was obtained by Dr. Rolla Flatten. Time out form was completed. Patient was positioned prone on the fluoroscopy table. Local anesthesia was provided with 1% lidocaine without epinephrine after prepped and draped in the usual sterile fashion. Puncture was performed at L3 using a 3 1/2 inch 22-gauge spinal needle via midline approach. Using a single pass through the dura, the needle was placed within the thecal sac, with return of clear CSF. 15 mL of Isovue M-200 was injected into the thecal sac, with normal opacification of the nerve roots and cauda equina consistent with free flow within the subarachnoid space. I personally performed the lumbar puncture and administered the intrathecal contrast. I also personally supervised acquisition of the myelogram images. TECHNIQUE: Contiguous axial images were obtained through the Lumbar spine after the intrathecal infusion of infusion. Coronal and sagittal reconstructions were obtained of the axial image sets. COMPARISON:  CT abdomen and pelvis 05/28/2018. FINDINGS: LUMBAR MYELOGRAM FINDINGS: Good opacification lumbar subarachnoid space. Previous T12 and L1 vertebral augmentations. Moderate stenosis at L3-4 and L4-5, multifactorial. BILATERAL L4 and BILATERAL L5 nerve root encroachment, respectively. Mild stenosis at L5-S1, with slight effacement both S1 nerve roots. The patient was too frail to stand for flexion extension radiographs. Lateral view with patient prone for myelography reveals  6 mm anterolisthesis L4 on L5. Moderate superior endplate depression at L4. Near complete loss of vertebral body height at L3, slight retropulsion of the posterior fragment. Minor superior endplate depression at L2 and L5. Significant chronic compression fractures at T12 and L1 status post vertebral augmentation. Osteopenic S1 vertebrae normal height. CT LUMBAR MYELOGRAM FINDINGS: Segmentation: Normal. Alignment: 6 mm anterolisthesis L4 on L5 appears facet mediated. Slight retropulsion L3. Vertebrae: Severe compression fracture of L3, near complete loss vertebral body height centrally.Status post vertebral augmentation T12 and L1, which appear satisfactory. Minor loss of vertebral body height at L2 and L5, and moderate loss of height L4 without significant retropulsion. Severe osteopenia. Conus medullaris: Normal in size and location, terminating L1 Paraspinal tissues:  Aortic atherosclerosis.  No masses. Disc levels: L1-L2: Normal disc space. Mild facet arthropathy. Mild epidural fat infolding. No impingement. L2-L3: Mild stenosis due to retropulsed L3 2-3 mm, and posterior element hypertrophy. Borderline L3 neural impingement. L3-L4: Moderate stenosis related to retropulsed L3 2-3 mm, and posterior element hypertrophy. BILATERAL L4 neural impingement. BILATERAL foraminal narrowing likely affects both L3 nerve roots. L4-L5: 6 mm anterolisthesis. Uncovering of the disc with superimposed central protrusion. Posterior element hypertrophy. Severe stenosis. BILATERAL L4 and L5 neural impingement. L5-S1:  Annular bulge.  Facet arthropathy.  No impingement. IMPRESSION: LUMBAR MYELOGRAM IMPRESSION: Moderate stenosis at L3-4 and L4-5 is multifactorial. BILATERAL L4 and L5 neural impingement, respectively. 6 mm anterolisthesis at L4-5 is facet mediated. Patient could not tolerate upright standing flexion extension views. Multiple compression fractures, described under CT lumbar. CT LUMBAR MYELOGRAM IMPRESSION: Severe  compression fracture L3, near complete loss of vertebral body height centrally. Slight retropulsion. Moderate multifactorial stenosis at L3-4 related to retropulsed the in L3 and posterior element hypertrophy with BILATERAL L3 and L4 neural impingement. Severe stenosis at L4-5 with 6 mm facet mediated anterolisthesis, uncovering of the disc with superimposed central protrusion and posterior element hypertrophy. BILATERAL L4 and L5 neural impingement. Chronic T12 and L1 compression fractures, status post vertebral augmentation, without adverse features. Moderate loss of height L4, minor loss of height L2, and L5, without significant wedging or retropulsion. Electronically Signed   By: Staci Righter M.D.   On: 06/26/2018 15:11   Nm Bone Scan Whole Body  Result Date: 07/05/2018 CLINICAL DATA:  Osteoporosis, new onset low back pain when rising, history of T12 and L1 kyphoplasties, question new compression fracture at L3 or L4 EXAM: NUCLEAR MEDICINE WHOLE BODY BONE SCAN TECHNIQUE: Whole body anterior and posterior images were obtained approximately 3 hours after intravenous injection of radiopharmaceutical. RADIOPHARMACEUTICALS:  21.6 mCi Technetium-22m MDP IV COMPARISON:  None Correlation: CT lumbar spine 06/26/2018, chest radiograph 01/29/2018 FINDINGS: Abnormal increased tracer localization identified transversely in the thoracolumbar spine at L1, L3, and and in the lower thoracic spine at approximately T9 suspect compression fractures. Analysis of prior CT exam demonstrates prior vertebroplasties at T12 and L1 as well as an old compression fracture of L3. The uptake at approximately L2 does not correspond to a compression fracture on the prior exam, question interval compression deformity. Uptake at the shoulders, feet and wrists, likely degenerative. No additional worrisome sites of osseous tracer accumulation are identified. IMPRESSION: Increased tracer uptake at T12 corresponding to prior compression fracture  and vertebroplasty. Increased tracer uptake at L2 vertebral body without abnormality on recent CT, question interval compression fracture; recommend radiographic follow-up. Uptake at approximately T9 vertebral body, corresponding to subtle superior endplate compression fracture on prior radiographs. Electronically Signed   By: Lavonia Dana M.D.   On: 07/05/2018 15:18   Dg Myelography Lumbar Inj Lumbosacral  Result Date: 06/26/2018 CLINICAL DATA:  Low back pain.  Osteoporosis.  Difficulty walking. EXAM: LUMBAR MYELOGRAM FLUOROSCOPY TIME:  44 seconds corresponding to a Dose Area Product of 211.56 Gy*m2 PROCEDURE: After thorough discussion of risks and benefits of the procedure including bleeding, infection, injury to nerves, blood vessels, adjacent structures as well as headache and CSF leak, written and oral informed consent was obtained. Consent was obtained by Dr. Rolla Flatten. Time out form was completed. Patient was positioned prone on the fluoroscopy table. Local anesthesia was provided with 1% lidocaine without epinephrine after prepped and draped in the usual sterile fashion. Puncture was performed at L3 using a 3  1/2 inch 22-gauge spinal needle via midline approach. Using a single pass through the dura, the needle was placed within the thecal sac, with return of clear CSF. 15 mL of Isovue M-200 was injected into the thecal sac, with normal opacification of the nerve roots and cauda equina consistent with free flow within the subarachnoid space. I personally performed the lumbar puncture and administered the intrathecal contrast. I also personally supervised acquisition of the myelogram images. TECHNIQUE: Contiguous axial images were obtained through the Lumbar spine after the intrathecal infusion of infusion. Coronal and sagittal reconstructions were obtained of the axial image sets. COMPARISON:  CT abdomen and pelvis 05/28/2018. FINDINGS: LUMBAR MYELOGRAM FINDINGS: Good opacification lumbar subarachnoid  space. Previous T12 and L1 vertebral augmentations. Moderate stenosis at L3-4 and L4-5, multifactorial. BILATERAL L4 and BILATERAL L5 nerve root encroachment, respectively. Mild stenosis at L5-S1, with slight effacement both S1 nerve roots. The patient was too frail to stand for flexion extension radiographs. Lateral view with patient prone for myelography reveals 6 mm anterolisthesis L4 on L5. Moderate superior endplate depression at L4. Near complete loss of vertebral body height at L3, slight retropulsion of the posterior fragment. Minor superior endplate depression at L2 and L5. Significant chronic compression fractures at T12 and L1 status post vertebral augmentation. Osteopenic S1 vertebrae normal height. CT LUMBAR MYELOGRAM FINDINGS: Segmentation: Normal. Alignment: 6 mm anterolisthesis L4 on L5 appears facet mediated. Slight retropulsion L3. Vertebrae: Severe compression fracture of L3, near complete loss vertebral body height centrally.Status post vertebral augmentation T12 and L1, which appear satisfactory. Minor loss of vertebral body height at L2 and L5, and moderate loss of height L4 without significant retropulsion. Severe osteopenia. Conus medullaris: Normal in size and location, terminating L1 Paraspinal tissues: Aortic atherosclerosis.  No masses. Disc levels: L1-L2: Normal disc space. Mild facet arthropathy. Mild epidural fat infolding. No impingement. L2-L3: Mild stenosis due to retropulsed L3 2-3 mm, and posterior element hypertrophy. Borderline L3 neural impingement. L3-L4: Moderate stenosis related to retropulsed L3 2-3 mm, and posterior element hypertrophy. BILATERAL L4 neural impingement. BILATERAL foraminal narrowing likely affects both L3 nerve roots. L4-L5: 6 mm anterolisthesis. Uncovering of the disc with superimposed central protrusion. Posterior element hypertrophy. Severe stenosis. BILATERAL L4 and L5 neural impingement. L5-S1:  Annular bulge.  Facet arthropathy.  No impingement.  IMPRESSION: LUMBAR MYELOGRAM IMPRESSION: Moderate stenosis at L3-4 and L4-5 is multifactorial. BILATERAL L4 and L5 neural impingement, respectively. 6 mm anterolisthesis at L4-5 is facet mediated. Patient could not tolerate upright standing flexion extension views. Multiple compression fractures, described under CT lumbar. CT LUMBAR MYELOGRAM IMPRESSION: Severe compression fracture L3, near complete loss of vertebral body height centrally. Slight retropulsion. Moderate multifactorial stenosis at L3-4 related to retropulsed the in L3 and posterior element hypertrophy with BILATERAL L3 and L4 neural impingement. Severe stenosis at L4-5 with 6 mm facet mediated anterolisthesis, uncovering of the disc with superimposed central protrusion and posterior element hypertrophy. BILATERAL L4 and L5 neural impingement. Chronic T12 and L1 compression fractures, status post vertebral augmentation, without adverse features. Moderate loss of height L4, minor loss of height L2, and L5, without significant wedging or retropulsion. Electronically Signed   By: Staci Righter M.D.   On: 06/26/2018 15:11   Ir Patient Eval Tech 0-60 Mins  Result Date: 07/13/2018 Riley Churches     07/13/2018 10:00 AM Patient brought into IR room for Kyphpoplasty procedure.  Urine results were indicative of infection.  Therefore, decision made to reschedule procedure by Dr. Estanislado Pandy.  Labs:  CBC: Recent Labs    02/05/18 0422 02/06/18 0448 02/07/18 0151 07/13/18 0700  WBC 9.1 10.7* 8.8 4.8  HGB 12.1 13.9 12.6 13.0  HCT 34.3* 38.6 36.6 40.6  PLT 215 243 228 244    COAGS: Recent Labs    01/29/18 1343 07/13/18 0700  INR 0.96 1.00    BMP: Recent Labs    02/06/18 0448 02/06/18 1744 02/07/18 0151 02/08/18 0429 07/13/18 0700  NA 124* 129* 130* 131* 132*  K 4.5  --  4.5 4.2 3.9  CL 89*  --  97* 97* 99  CO2 25  --  25 23 24   GLUCOSE 109*  --  118* 115* 90  BUN 23  --  30* 32* 17  CALCIUM 9.1  --  9.0 9.2 9.2    CREATININE 0.71  --  0.99 0.94 0.96  GFRNONAA >60  --  49* 52* 52*  GFRAA >60  --  57* >60 >60    LIVER FUNCTION TESTS: Recent Labs    02/04/18 0359 02/05/18 0422 02/06/18 0448 02/07/18 0151  BILITOT 0.7 0.4 0.5 0.4  AST 19 20 22 21   ALT 17 20 22 22   ALKPHOS 45 51 59 53  PROT 5.9* 6.2* 6.9 6.3*  ALBUMIN 3.4* 3.5 3.9 3.6    TUMOR MARKERS: No results for input(s): AFPTM, CEA, CA199, CHROMGRNA in the last 8760 hours.  Assessment and Plan:  L2 compression fracture. Plan for image-guided L2 kyphoplasty/vertebroplasty this AM with Dr. Estanislado Pandy. Patient is NPO. Afebrile and WBCs WNL. INR 1.00 seconds 07/13/2018. She is taking Eliquis- reports last dose 07/13/2018.  Risks and benefits of L2 kyphoplasty/vertebroplasty were discussed with the patient including, but not limited to education regarding the natural healing process of compression fractures without intervention, bleeding, infection, cement migration which may cause spinal cord damage, paralysis, pulmonary embolism or even death. This interventional procedure involves the use of X-rays and because of the nature of the planned procedure, it is possible that we will have prolonged use of X-ray fluoroscopy. Potential radiation risks to you include (but are not limited to) the following: - A slightly elevated risk for cancer  several years later in life. This risk is typically less than 0.5% percent. This risk is low in comparison to the normal incidence of human cancer, which is 33% for women and 50% for men according to the [place]. - Radiation induced injury can include skin redness, resembling a rash, tissue breakdown / ulcers and hair loss (which can be temporary or permanent).  The likelihood of either of these occurring depends on the difficulty of the procedure and whether you are sensitive to radiation due to previous procedures, disease, or genetic conditions.  IF your procedure requires a prolonged use  of radiation, you will be notified and given written instructions for further action.  It is your responsibility to monitor the irradiated area for the 2 weeks following the procedure and to notify your physician if you are concerned that you have suffered a radiation induced injury.   All of the patient's questions were answered, patient is agreeable to proceed. Consent signed and in chart.   Thank you for this interesting consult.  I greatly enjoyed meeting POCAHONTAS COHENOUR and look forward to participating in their care.  A copy of this report was sent to the requesting provider on this date.  Electronically Signed: Earley Abide, PA-C 07/16/2018, 7:31 AM   I spent a total of 25 Minutes in face to  face in clinical consultation, greater than 50% of which was counseling/coordinating care for L2 compression fracture.

## 2018-07-16 NOTE — Procedures (Signed)
S/P L 2 KP

## 2018-07-17 ENCOUNTER — Encounter (HOSPITAL_COMMUNITY): Payer: Self-pay | Admitting: Interventional Radiology

## 2018-09-04 ENCOUNTER — Ambulatory Visit (INDEPENDENT_AMBULATORY_CARE_PROVIDER_SITE_OTHER): Payer: Medicare HMO

## 2018-09-04 DIAGNOSIS — I495 Sick sinus syndrome: Secondary | ICD-10-CM | POA: Diagnosis not present

## 2018-09-07 ENCOUNTER — Encounter: Payer: Self-pay | Admitting: Cardiology

## 2018-09-07 NOTE — Progress Notes (Signed)
Remote pacemaker transmission.   

## 2018-09-09 LAB — CUP PACEART REMOTE DEVICE CHECK
Brady Statistic AP VP Percent: 0.06 %
Brady Statistic AP VS Percent: 29.14 %
Brady Statistic AS VP Percent: 0.14 %
Brady Statistic AS VS Percent: 70.65 %
Brady Statistic RA Percent Paced: 28.9 %
Brady Statistic RV Percent Paced: 0.21 %
Date Time Interrogation Session: 20200121173135
Implantable Lead Implant Date: 20130626
Implantable Lead Implant Date: 20130626
Implantable Lead Location: 753860
Implantable Pulse Generator Implant Date: 20130626
Lead Channel Impedance Value: 400 Ohm
Lead Channel Impedance Value: 408 Ohm
Lead Channel Sensing Intrinsic Amplitude: 0.891 mV
Lead Channel Sensing Intrinsic Amplitude: 5.203 mV
Lead Channel Setting Pacing Amplitude: 2 V
Lead Channel Setting Pacing Amplitude: 2.5 V
Lead Channel Setting Pacing Pulse Width: 0.4 ms
Lead Channel Setting Sensing Sensitivity: 0.9 mV
MDC IDC LEAD LOCATION: 753859
MDC IDC MSMT BATTERY VOLTAGE: 2.96 V

## 2018-09-10 DIAGNOSIS — R69 Illness, unspecified: Secondary | ICD-10-CM | POA: Diagnosis not present

## 2018-09-11 DIAGNOSIS — K219 Gastro-esophageal reflux disease without esophagitis: Secondary | ICD-10-CM | POA: Diagnosis not present

## 2018-09-11 DIAGNOSIS — E785 Hyperlipidemia, unspecified: Secondary | ICD-10-CM | POA: Diagnosis not present

## 2018-09-11 DIAGNOSIS — E871 Hypo-osmolality and hyponatremia: Secondary | ICD-10-CM | POA: Diagnosis not present

## 2018-09-11 DIAGNOSIS — I482 Chronic atrial fibrillation, unspecified: Secondary | ICD-10-CM | POA: Diagnosis not present

## 2018-09-11 DIAGNOSIS — I129 Hypertensive chronic kidney disease with stage 1 through stage 4 chronic kidney disease, or unspecified chronic kidney disease: Secondary | ICD-10-CM | POA: Diagnosis not present

## 2018-09-13 ENCOUNTER — Other Ambulatory Visit: Payer: Self-pay | Admitting: Cardiovascular Disease

## 2018-09-17 ENCOUNTER — Other Ambulatory Visit: Payer: Self-pay | Admitting: Cardiovascular Disease

## 2018-09-17 DIAGNOSIS — I480 Paroxysmal atrial fibrillation: Secondary | ICD-10-CM

## 2018-09-18 ENCOUNTER — Other Ambulatory Visit: Payer: Self-pay | Admitting: Cardiovascular Disease

## 2018-09-18 NOTE — Telephone Encounter (Signed)
Pt pharmacy is requesting refill for Metoprolol 25 mg, but, Metoprolol 75 mg is listed on medication list. Please address. Thank you.

## 2018-09-19 MED ORDER — METOPROLOL TARTRATE 75 MG PO TABS: 75.0000 mg | ORAL_TABLET | Freq: Two times a day (BID) | ORAL | 1 refills | Status: DC

## 2018-09-19 NOTE — Telephone Encounter (Signed)
°*  STAT* If patient is at the pharmacy, call can be transferred to refill team.   1. Which medications need to be refilled? (please list name of each medication and dose if known) metoprolol tartrate 25 MG TABS  2. Which pharmacy/location (including street and city if local pharmacy) is medication to be sent to? [place], [place] [employer]  3. Do they need a 30 day or 90 day supply? 90 days

## 2018-09-20 ENCOUNTER — Telehealth: Payer: Self-pay | Admitting: Nurse Practitioner

## 2018-09-20 NOTE — Telephone Encounter (Signed)
Left message on voice mail of patient's son, [name], to make certain patient has Metoprolol tartrate 75 mg to be taken twice daily due to refill request from pharmacy on a different dose. I asked him to call back with questions or concerns.

## 2018-10-04 ENCOUNTER — Ambulatory Visit: Payer: Medicare HMO | Admitting: Cardiovascular Disease

## 2018-10-04 DIAGNOSIS — M810 Age-related osteoporosis without current pathological fracture: Secondary | ICD-10-CM | POA: Diagnosis not present

## 2018-10-11 DIAGNOSIS — R69 Illness, unspecified: Secondary | ICD-10-CM | POA: Diagnosis not present

## 2018-10-31 DIAGNOSIS — Z950 Presence of cardiac pacemaker: Secondary | ICD-10-CM | POA: Diagnosis not present

## 2018-10-31 DIAGNOSIS — J309 Allergic rhinitis, unspecified: Secondary | ICD-10-CM | POA: Diagnosis not present

## 2018-10-31 DIAGNOSIS — I509 Heart failure, unspecified: Secondary | ICD-10-CM | POA: Diagnosis not present

## 2018-10-31 DIAGNOSIS — Z888 Allergy status to other drugs, medicaments and biological substances status: Secondary | ICD-10-CM | POA: Diagnosis not present

## 2018-10-31 DIAGNOSIS — G4700 Insomnia, unspecified: Secondary | ICD-10-CM | POA: Diagnosis not present

## 2018-10-31 DIAGNOSIS — I4891 Unspecified atrial fibrillation: Secondary | ICD-10-CM | POA: Diagnosis not present

## 2018-10-31 DIAGNOSIS — E785 Hyperlipidemia, unspecified: Secondary | ICD-10-CM | POA: Diagnosis not present

## 2018-10-31 DIAGNOSIS — I110 Hypertensive heart disease with heart failure: Secondary | ICD-10-CM | POA: Diagnosis not present

## 2018-10-31 DIAGNOSIS — M810 Age-related osteoporosis without current pathological fracture: Secondary | ICD-10-CM | POA: Diagnosis not present

## 2018-10-31 DIAGNOSIS — Z7901 Long term (current) use of anticoagulants: Secondary | ICD-10-CM | POA: Diagnosis not present

## 2018-11-08 ENCOUNTER — Other Ambulatory Visit: Payer: Self-pay | Admitting: Cardiovascular Disease

## 2018-11-12 ENCOUNTER — Ambulatory Visit: Payer: Medicare HMO | Admitting: Cardiovascular Disease

## 2018-11-15 ENCOUNTER — Telehealth: Payer: Self-pay

## 2018-11-15 NOTE — Telephone Encounter (Signed)
Left message to call back about mother's appt.

## 2018-11-22 ENCOUNTER — Telehealth: Payer: Self-pay | Admitting: Cardiovascular Disease

## 2018-11-22 NOTE — Telephone Encounter (Signed)
Talked with patient's son and Webex visit for 4/16 was confirmed.  He agrees to have patient check BP and weight on the morning of the visit.   YOUR CARDIOLOGY TEAM HAS ARRANGED FOR AN E-VISIT FOR YOUR APPOINTMENT - PLEASE REVIEW IMPORTANT INFORMATION BELOW SEVERAL DAYS PRIOR TO YOUR APPOINTMENT  Due to the recent COVID-19 pandemic, we are transitioning in-person office visits to tele-medicine visits in an effort to decrease unnecessary exposure to our patients and staff. Medicare and most insurances are covering these visits without a copay needed. We also encourage you to sign up for MyChart if you have not already done so. You will need a smartphone if possible. For patients that do not have this, we can still complete the visit using a regular telephone but do prefer a smartphone to enable video when possible. You may have a close family member that lives with you that can help. If possible, we also ask that you have a blood pressure cuff and scale at home to measure your blood pressure, heart rate and weight prior to your scheduled appointment. Patients with clinical needs that need an in-person evaluation and testing will still be able to come to the office if absolutely necessary. If you have any questions, feel free to call our office.    IF YOU HAVE A SMARTPHONE, PLEASE DOWNLOAD THE WEBEX APP TO YOUR SMARTPHONE  - If Apple, go to [employer] and type in WebEx in the search bar. [place] [employer], the blue/green circle. The app is free but as with any other app download, your phone may require you to verify saved payment information or Apple password. You do NOT have to create a WebEx account.  - If Android, go to [employer] and type in [employer] in the search bar. [place] [employer], the blue/green circle. The app is free but as with any other app download, your phone may require you to verify saved payment information or Android password. You do NOT have to create a  WebEx account.  It is very helpful to have this downloaded before your visit.    2-3 DAYS BEFORE YOUR APPOINTMENT  You will receive a telephone call from one of our [facility] team members - your caller ID may say "Unknown caller." If this is a video visit, we will confirm that you have been able to download the WebEx app. We will remind you check your blood pressure, heart rate and weight prior to your scheduled appointment. If you have an Apple Watch or Kardia, please upload any pertinent ECG strips the day before or morning of your appointment to [place]. Our staff will also make sure you have reviewed the consent and agree to move forward with your scheduled tele-health visit.     THE DAY OF YOUR APPOINTMENT  Approximately 15 minutes prior to your scheduled appointment, you will receive a telephone call from one of [facility] team - your caller ID may say "Unknown caller."  Our staff will confirm medications, vital signs for the day and any symptoms you may be experiencing. Please have this information available prior to the time of visit start. It may also be helpful for you to have a pad of paper and pen handy for any instructions given during your visit. They will also walk you through joining the WebEx smartphone meeting if this is a video visit.    CONSENT FOR TELE-HEALTH VISIT - PLEASE REVIEW  I hereby voluntarily request, consent and authorize [facility]  and its employed or contracted physicians, Engineer, materials, nurse practitioners or other licensed health care professionals (the Practitioner), to provide me with telemedicine health care services (the "Services") as deemed necessary by the treating Practitioner. I acknowledge and consent to receive the Services by the Practitioner via telemedicine. I understand that the telemedicine visit will involve communicating with the Practitioner through live audiovisual communication technology and the disclosure of certain medical  information by electronic transmission. I acknowledge that I have been given the opportunity to request an in-person assessment or other available alternative prior to the telemedicine visit and am voluntarily participating in the telemedicine visit.  I understand that I have the right to withhold or withdraw my consent to the use of telemedicine in the course of my care at any time, without affecting my right to future care or treatment, and that the Practitioner or I may terminate the telemedicine visit at any time. I understand that I have the right to inspect all information obtained and/or recorded in the course of the telemedicine visit and may receive copies of available information for a reasonable fee.  I understand that some of the potential risks of receiving the Services via telemedicine include:  Marland Kitchen Delay or interruption in medical evaluation due to technological equipment failure or disruption; . Information transmitted may not be sufficient (e.g. poor resolution of images) to allow for appropriate medical decision making by the Practitioner; and/or  . In rare instances, security protocols could fail, causing a breach of personal health information.  Furthermore, I acknowledge that it is my responsibility to provide information about my medical history, conditions and care that is complete and accurate to the best of my ability. I acknowledge that Practitioner's advice, recommendations, and/or decision may be based on factors not within their control, such as incomplete or inaccurate data provided by me or distortions of diagnostic images or specimens that may result from electronic transmissions. I understand that the practice of medicine is not an exact science and that Practitioner makes no warranties or guarantees regarding treatment outcomes. I acknowledge that I will receive a copy of this consent concurrently upon execution via email to the email address I last provided but may also  request a printed copy by calling the office of [facility].    I understand that my insurance will be billed for this visit.   I have read or had this consent read to me. . I understand the contents of this consent, which adequately explains the benefits and risks of the Services being provided via telemedicine.  . I have been provided ample opportunity to ask questions regarding this consent and the Services and have had my questions answered to my satisfaction. . I give my informed consent for the services to be provided through the use of telemedicine in my medical care  By participating in this telemedicine visit I agree to the above.

## 2018-11-22 NOTE — Telephone Encounter (Signed)
Follow up:   Patient son returning a call back.

## 2018-11-22 NOTE — Telephone Encounter (Signed)
New Message   Patient returning Donna Ortega's phone call would like her to call him back.

## 2018-11-29 ENCOUNTER — Encounter: Payer: Self-pay | Admitting: Cardiovascular Disease

## 2018-11-29 ENCOUNTER — Other Ambulatory Visit: Payer: Self-pay

## 2018-11-29 ENCOUNTER — Telehealth (INDEPENDENT_AMBULATORY_CARE_PROVIDER_SITE_OTHER): Payer: Medicare HMO | Admitting: Cardiovascular Disease

## 2018-11-29 ENCOUNTER — Ambulatory Visit: Payer: Medicare HMO | Admitting: Cardiovascular Disease

## 2018-11-29 VITALS — BP 149/75 | HR 65 | Ht 64.0 in | Wt 140.0 lb

## 2018-11-29 DIAGNOSIS — I482 Chronic atrial fibrillation, unspecified: Secondary | ICD-10-CM

## 2018-11-29 DIAGNOSIS — I4891 Unspecified atrial fibrillation: Secondary | ICD-10-CM | POA: Diagnosis not present

## 2018-11-29 DIAGNOSIS — E871 Hypo-osmolality and hyponatremia: Secondary | ICD-10-CM

## 2018-11-29 DIAGNOSIS — I2510 Atherosclerotic heart disease of native coronary artery without angina pectoris: Secondary | ICD-10-CM | POA: Diagnosis not present

## 2018-11-29 DIAGNOSIS — Z7189 Other specified counseling: Secondary | ICD-10-CM

## 2018-11-29 NOTE — Progress Notes (Signed)
Virtual Visit via Video Note   This visit type was conducted due to national recommendations for restrictions regarding the COVID-19 Pandemic (e.g. social distancing) in an effort to limit this patient's exposure and mitigate transmission in our community.  Due to her co-morbid illnesses, this patient is at least at moderate risk for complications without adequate follow up.  This format is felt to be most appropriate for this patient at this time.  All issues noted in this document were discussed and addressed.  A limited physical exam was performed with this format.  Please refer to the patient's chart for her consent to telehealth for [facility].   Evaluation Performed:  Follow-up visit  Date:  11/29/2018   ID:  Donna Ortega, Donna Ortega [birth year], MRN [id number]  Patient Location: Home Provider Location: Home  PCP:  Lanice Shirts, MD  Cardiologist:  Mertie Moores, MD  Electrophysiologist:  None   Chief Complaint:  Follow up CAD, atrial fib   History of Present Illness:    Donna Ortega is a 85 y.o. female with Coronary artery disease and atrial fibrillation.  She also has a history of GI bleed, anemia and rapid atrial fibrillation.  Has had episodes of hyponatremia - was in the hospital Hospital in Dec. For kyphoplasty   No CP , syncope .n No exercise  Takes care of herself Climbs stairs without difficulty. Does not go shopping , no recent falls   No fever, cough, aches . Social distancing,   Wears a mask  The patient does not have symptoms concerning for COVID-19 infection (fever, chills, cough, or new shortness of breath).    Past Medical History:  Diagnosis Date  . Arthritis    osteo   in back  . Coronary artery disease 2007   3 vessel CABG with LIMA-LAD, SVG to diag, and SVG - Ramus  . Diverticulitis   . GI bleeding 09/09/2013  . Hearing loss of left ear 07/2013  . History of atrial fibrillation; review of all ECG are most consistent with atrial  tachycardia    had rash with amiodarone; no anticoagulation due to GI bleed in December 2012  . History of GI bleed Dec 2012  . History of hyperkalemia   . Hyperlipidemia   . Hypertension   . Hyponatremia   . Pacemaker-Medtronic 02/09/2012  . Palpitations    Past Surgical History:  Procedure Laterality Date  . ABDOMINAL HYSTERECTOMY    . COLONOSCOPY N/A 09/12/2013   Procedure: COLONOSCOPY;  Surgeon: Wonda Horner, MD;  Location[facility] ENDOSCOPY;  Service: Endoscopy;  Laterality: N/A;  . CORONARY ARTERY BYPASS GRAFT  Feb 2007   Dr. Cyndia Bent;   . ESOPHAGOGASTRODUODENOSCOPY  08/04/2011   Procedure: ESOPHAGOGASTRODUODENOSCOPY (EGD);  Surgeon: Cleotis Nipper, MD;  Location[facility] ENDOSCOPY;  Service: Endoscopy;  Laterality: N/A;  do at bedside  . ESOPHAGOGASTRODUODENOSCOPY N/A 09/11/2013   Procedure: ESOPHAGOGASTRODUODENOSCOPY (EGD);  Surgeon: Jeryl Columbia, MD;  Location: [facility] ENDOSCOPY;  Service: Endoscopy;  Laterality: N/A;  . IR KYPHO LUMBAR INC FX REDUCE BONE BX UNI/BIL CANNULATION INC/IMAGING  07/16/2018  . IR KYPHO THORACIC WITH BONE BIOPSY  04/11/2017  . IR PATIENT EVAL TECH 0-60 MINS  07/13/2018  . IR RADIOLOGIST EVAL & MGMT  04/04/2017  . MAZE    . pace maker    . PARTIAL HYSTERECTOMY    . PERMANENT PACEMAKER INSERTION N/A 02/08/2012   Procedure: PERMANENT PACEMAKER INSERTION;  Surgeon: Deboraha Sprang, MD;  Location: [facility] C[facility] LAB;  Service: Cardiovascular;  Laterality: N/A;     Current Meds  Medication Sig  . Calcium Carbonate-Vitamin D (CALTRATE 600+D PO) Take 1 tablet by mouth daily.   Marland Kitchen diltiazem (CARDIZEM CD) 360 MG 24 hr capsule Take 1 capsule (360 mg total) by mouth daily.  . diphenhydramine-acetaminophen (TYLENOL PM) 25-500 MG TABS tablet Take 2 tablets by mouth at bedtime.  Marland Kitchen ELIQUIS 2.5 MG TABS tablet TAKE 1 TABLET BY MOUTH TWICE A DAY  . furosemide (LASIX) 20 MG tablet Take 1 tablet (20 mg total) by mouth daily.  . Metoprolol Tartrate 75 MG TABS Take 75 mg by mouth 2 (two) times  daily.  . rosuvastatin (CRESTOR) 10 MG tablet TAKE 1 TABLET (10 MG TOTAL) BY MOUTH DAILY.  . TYMLOS 3120 MCG/1.56ML SOPN Inject as directed daily.     Allergies:   Amiodarone; Aspirin; Atorvastatin; Lipitor [atorvastatin calcium]; Nsaids; Tolmetin; Naproxen; Niacin; and Niaspan [niacin er]   Social History   Tobacco Use  . Smoking status: Never Smoker  . Smokeless tobacco: Never Used  Substance Use Topics  . Alcohol use: No  . Drug use: No     Family Hx: The patient's family history includes Bone cancer in her brother; Cancer in her sister; Colon cancer in her sister; Heart failure in her father; Kidney failure in her father and sister.  ROS:   Please see the history of present illness.     All other systems reviewed and are negative.   Prior CV studies:   The following studies were reviewed today:    Labs/Other Tests and Data Reviewed:    EKG:  No ECG reviewed.  Recent Labs: 01/31/2018: TSH 0.846 02/07/2018: ALT 22; Magnesium 2.1 07/13/2018: BUN 17; Creatinine, Ser 0.96; Hemoglobin 13.0; Platelets 244; Potassium 3.9; Sodium 132   Recent Lipid Panel Lab Results  Component Value Date/Time   CHOL 213 (H) 10/11/2017 09:51 AM   TRIG 88 10/11/2017 09:51 AM   HDL 109 10/11/2017 09:51 AM   CHOLHDL 2.0 10/11/2017 09:51 AM   CHOLHDL 2.4 09/28/2015 11:16 AM   LDLCALC 86 10/11/2017 09:51 AM   LDLDIRECT 98.6 07/15/2013 10:31 AM    Wt Readings from Last 3 Encounters:  11/29/18 140 lb (63.5 kg)  07/16/18 110 lb (49.9 kg)  07/13/18 120 lb (54.4 kg)     Objective:    Vital Signs:  BP (!) 149/75 (BP Location: Right Arm, Patient Position: Sitting, Cuff Size: Normal)   Pulse 65   Ht 5\' 4"  (1.626 m)   Wt 140 lb (63.5 kg)   BMI 24.03 kg/m    General:   Appears healthy,   NAD HEENT:   No obvious JVD or lymphadenopathy Resp:   Normal work of breathing,   resp rate is normal  CV :   BP and HR are normal ,  No edema Abd:   No abdomina swelling , Ext:   No clubbing,  cyanosis, or edema  Neuro:   Alert and oriented x 3.   No obvious motor deficits Skin : no obvious rashes    ASSESSMENT & PLAN:    1. CAD:  Has not had any CP.   Continue current plans   2.  Atrial fib :  No further episodes of palpitations   3,.  Hyponatremia - sodium is 132. Seems stable for now  Advised her to avoid lots of free water for now  Overall , she is doing well for 85 years old.     COVID-19 Education: The  signs and symptoms of COVID-19 were discussed with the patient and how to seek care for testing (follow up with PCP or arrange E-visit).  The importance of social distancing was discussed today.  Time:   Today, I have spent  20  minutes with the patient with telehealth technology discussing the above problems.  15 minutes additional charting / pre charting .    Medication Adjustments/Labs and Tests Ordered: Current medicines are reviewed at length with the patient today.  Concerns regarding medicines are outlined above.   Tests Ordered: No orders of the defined types were placed in this encounter.   Medication Changes: No orders of the defined types were placed in this encounter.   Disposition:  Follow up in 6 month(s)  Signed, Mertie Moores, MD  11/29/2018 11:10 AM    [place]

## 2018-11-29 NOTE — Patient Instructions (Signed)

## 2018-12-04 LAB — CUP PACEART REMOTE DEVICE CHECK
Battery Voltage: 2.96 V
Brady Statistic AP VP Percent: 0.16 %
Brady Statistic AP VS Percent: 72.42 %
Brady Statistic AS VP Percent: 0.29 %
Brady Statistic AS VS Percent: 27.13 %
Brady Statistic RA Percent Paced: 72.46 %
Brady Statistic RV Percent Paced: 0.46 %
Date Time Interrogation Session: 20200421130629
Implantable Lead Implant Date: 20130626
Implantable Lead Implant Date: 20130626
Implantable Lead Location: 753859
Implantable Lead Location: 753860
Implantable Pulse Generator Implant Date: 20130626
Lead Channel Impedance Value: 384 Ohm
Lead Channel Impedance Value: 392 Ohm
Lead Channel Sensing Intrinsic Amplitude: 0.763 mV
Lead Channel Sensing Intrinsic Amplitude: 5.55 mV
Lead Channel Setting Pacing Amplitude: 2 V
Lead Channel Setting Pacing Amplitude: 2.5 V
Lead Channel Setting Pacing Pulse Width: 0.4 ms
Lead Channel Setting Sensing Sensitivity: 0.9 mV

## 2018-12-06 ENCOUNTER — Other Ambulatory Visit: Payer: Self-pay

## 2018-12-06 ENCOUNTER — Ambulatory Visit (INDEPENDENT_AMBULATORY_CARE_PROVIDER_SITE_OTHER): Payer: Medicare HMO | Admitting: *Deleted

## 2018-12-06 DIAGNOSIS — Z8679 Personal history of other diseases of the circulatory system: Secondary | ICD-10-CM

## 2018-12-06 DIAGNOSIS — I495 Sick sinus syndrome: Secondary | ICD-10-CM

## 2018-12-11 DIAGNOSIS — Z1239 Encounter for other screening for malignant neoplasm of breast: Secondary | ICD-10-CM | POA: Diagnosis not present

## 2018-12-11 DIAGNOSIS — Z1211 Encounter for screening for malignant neoplasm of colon: Secondary | ICD-10-CM | POA: Diagnosis not present

## 2018-12-11 DIAGNOSIS — Z0000 Encounter for general adult medical examination without abnormal findings: Secondary | ICD-10-CM | POA: Diagnosis not present

## 2018-12-11 DIAGNOSIS — I10 Essential (primary) hypertension: Secondary | ICD-10-CM | POA: Diagnosis not present

## 2018-12-11 DIAGNOSIS — G3184 Mild cognitive impairment, so stated: Secondary | ICD-10-CM | POA: Diagnosis not present

## 2018-12-11 DIAGNOSIS — I495 Sick sinus syndrome: Secondary | ICD-10-CM | POA: Diagnosis not present

## 2018-12-11 DIAGNOSIS — E871 Hypo-osmolality and hyponatremia: Secondary | ICD-10-CM | POA: Diagnosis not present

## 2018-12-11 DIAGNOSIS — M4850XD Collapsed vertebra, not elsewhere classified, site unspecified, subsequent encounter for fracture with routine healing: Secondary | ICD-10-CM | POA: Diagnosis not present

## 2018-12-11 DIAGNOSIS — M8000XG Age-related osteoporosis with current pathological fracture, unspecified site, subsequent encounter for fracture with delayed healing: Secondary | ICD-10-CM | POA: Diagnosis not present

## 2018-12-11 DIAGNOSIS — Z1389 Encounter for screening for other disorder: Secondary | ICD-10-CM | POA: Diagnosis not present

## 2018-12-14 NOTE — Progress Notes (Signed)
Remote pacemaker transmission.   

## 2018-12-18 DIAGNOSIS — R5383 Other fatigue: Secondary | ICD-10-CM | POA: Diagnosis not present

## 2018-12-18 DIAGNOSIS — M810 Age-related osteoporosis without current pathological fracture: Secondary | ICD-10-CM | POA: Diagnosis not present

## 2018-12-18 DIAGNOSIS — E559 Vitamin D deficiency, unspecified: Secondary | ICD-10-CM | POA: Diagnosis not present

## 2019-01-21 ENCOUNTER — Other Ambulatory Visit: Payer: Self-pay | Admitting: Cardiovascular Disease

## 2019-02-04 ENCOUNTER — Other Ambulatory Visit: Payer: Self-pay | Admitting: Cardiovascular Disease

## 2019-02-05 ENCOUNTER — Other Ambulatory Visit: Payer: Self-pay | Admitting: Cardiovascular Disease

## 2019-02-05 NOTE — Telephone Encounter (Signed)
Pt's medication was sent to pt's pharmacy as requested. Confirmation received.  °

## 2019-02-25 ENCOUNTER — Other Ambulatory Visit: Payer: Self-pay | Admitting: Cardiovascular Disease

## 2019-03-04 ENCOUNTER — Other Ambulatory Visit: Payer: Self-pay | Admitting: Cardiovascular Disease

## 2019-03-04 MED ORDER — DILTIAZEM HCL ER COATED BEADS 360 MG PO CP24: 360.0000 mg | ORAL_CAPSULE | Freq: Every day | ORAL | 2 refills | Status: DC

## 2019-03-07 ENCOUNTER — Ambulatory Visit (INDEPENDENT_AMBULATORY_CARE_PROVIDER_SITE_OTHER): Payer: Medicare HMO | Admitting: *Deleted

## 2019-03-07 DIAGNOSIS — I495 Sick sinus syndrome: Secondary | ICD-10-CM | POA: Diagnosis not present

## 2019-03-08 LAB — CUP PACEART REMOTE DEVICE CHECK
Battery Voltage: 2.95 V
Brady Statistic AP VP Percent: 0.13 %
Brady Statistic AP VS Percent: 73.77 %
Brady Statistic AS VP Percent: 0.34 %
Brady Statistic AS VS Percent: 25.76 %
Brady Statistic RA Percent Paced: 73.55 %
Brady Statistic RV Percent Paced: 0.42 %
Date Time Interrogation Session: 20200724144633
Implantable Lead Implant Date: 20130626
Implantable Lead Implant Date: 20130626
Implantable Lead Location: 753859
Implantable Lead Location: 753860
Implantable Pulse Generator Implant Date: 20130626
Lead Channel Impedance Value: 384 Ohm
Lead Channel Impedance Value: 392 Ohm
Lead Channel Sensing Intrinsic Amplitude: 0.679 mV
Lead Channel Sensing Intrinsic Amplitude: 4.509 mV
Lead Channel Setting Pacing Amplitude: 2 V
Lead Channel Setting Pacing Amplitude: 2.5 V
Lead Channel Setting Pacing Pulse Width: 0.4 ms
Lead Channel Setting Sensing Sensitivity: 0.9 mV

## 2019-03-11 DIAGNOSIS — R350 Frequency of micturition: Secondary | ICD-10-CM | POA: Diagnosis not present

## 2019-03-11 DIAGNOSIS — E7800 Pure hypercholesterolemia, unspecified: Secondary | ICD-10-CM | POA: Diagnosis not present

## 2019-03-11 DIAGNOSIS — M810 Age-related osteoporosis without current pathological fracture: Secondary | ICD-10-CM | POA: Diagnosis not present

## 2019-03-11 DIAGNOSIS — I10 Essential (primary) hypertension: Secondary | ICD-10-CM | POA: Diagnosis not present

## 2019-03-11 DIAGNOSIS — N183 Chronic kidney disease, stage 3 (moderate): Secondary | ICD-10-CM | POA: Diagnosis not present

## 2019-03-11 DIAGNOSIS — I480 Paroxysmal atrial fibrillation: Secondary | ICD-10-CM | POA: Diagnosis not present

## 2019-03-19 NOTE — Progress Notes (Signed)
Remote pacemaker transmission.   

## 2019-05-31 ENCOUNTER — Other Ambulatory Visit: Payer: Self-pay | Admitting: Cardiovascular Disease

## 2019-06-01 ENCOUNTER — Other Ambulatory Visit: Payer: Self-pay | Admitting: Cardiovascular Disease

## 2019-06-07 ENCOUNTER — Encounter: Payer: Medicare HMO | Admitting: *Deleted

## 2019-06-20 DIAGNOSIS — R69 Illness, unspecified: Secondary | ICD-10-CM | POA: Diagnosis not present

## 2019-06-27 ENCOUNTER — Telehealth: Payer: Self-pay | Admitting: Cardiovascular Disease

## 2019-06-27 ENCOUNTER — Ambulatory Visit (INDEPENDENT_AMBULATORY_CARE_PROVIDER_SITE_OTHER): Payer: Medicare HMO | Admitting: *Deleted

## 2019-06-27 DIAGNOSIS — I495 Sick sinus syndrome: Secondary | ICD-10-CM | POA: Diagnosis not present

## 2019-06-27 DIAGNOSIS — I482 Chronic atrial fibrillation, unspecified: Secondary | ICD-10-CM

## 2019-06-27 LAB — CUP PACEART REMOTE DEVICE CHECK
Battery Voltage: 2.95 V
Brady Statistic AP VP Percent: 0.24 %
Brady Statistic AP VS Percent: 78.4 %
Brady Statistic AS VP Percent: 0.29 %
Brady Statistic AS VS Percent: 21.08 %
Brady Statistic RA Percent Paced: 78.41 %
Brady Statistic RV Percent Paced: 0.52 %
Date Time Interrogation Session: 20201112140512
Implantable Lead Implant Date: 20130626
Implantable Lead Implant Date: 20130626
Implantable Lead Location: 753859
Implantable Lead Location: 753860
Implantable Pulse Generator Implant Date: 20130626
Lead Channel Impedance Value: 384 Ohm
Lead Channel Impedance Value: 392 Ohm
Lead Channel Sensing Intrinsic Amplitude: 0.594 mV
Lead Channel Sensing Intrinsic Amplitude: 4.856 mV
Lead Channel Setting Pacing Amplitude: 2 V
Lead Channel Setting Pacing Amplitude: 2.5 V
Lead Channel Setting Pacing Pulse Width: 0.4 ms
Lead Channel Setting Sensing Sensitivity: 0.9 mV

## 2019-06-27 NOTE — Telephone Encounter (Signed)
New Message:  Patient sent a home remote transmission this morning and wanted to make sure the office got it.

## 2019-06-27 NOTE — Telephone Encounter (Signed)
Patient needs to speak with someone in devices regarding a transmission she sent out today.

## 2019-06-27 NOTE — Telephone Encounter (Signed)
I spoke with the pt and let her know we did received her transmission. The pt thanked me for the call.

## 2019-07-18 DIAGNOSIS — R69 Illness, unspecified: Secondary | ICD-10-CM | POA: Diagnosis not present

## 2019-07-19 NOTE — Progress Notes (Signed)
Remote pacemaker transmission.   

## 2019-07-29 DIAGNOSIS — E785 Hyperlipidemia, unspecified: Secondary | ICD-10-CM | POA: Diagnosis not present

## 2019-07-29 DIAGNOSIS — K219 Gastro-esophageal reflux disease without esophagitis: Secondary | ICD-10-CM | POA: Diagnosis not present

## 2019-07-29 DIAGNOSIS — E871 Hypo-osmolality and hyponatremia: Secondary | ICD-10-CM | POA: Diagnosis not present

## 2019-07-29 DIAGNOSIS — I482 Chronic atrial fibrillation, unspecified: Secondary | ICD-10-CM | POA: Diagnosis not present

## 2019-07-29 DIAGNOSIS — I129 Hypertensive chronic kidney disease with stage 1 through stage 4 chronic kidney disease, or unspecified chronic kidney disease: Secondary | ICD-10-CM | POA: Diagnosis not present

## 2019-09-02 ENCOUNTER — Other Ambulatory Visit: Payer: Self-pay | Admitting: Cardiovascular Disease

## 2019-09-02 DIAGNOSIS — I480 Paroxysmal atrial fibrillation: Secondary | ICD-10-CM

## 2019-09-03 NOTE — Telephone Encounter (Signed)
Prescription refill request for Eliquis received.  Last office visit: 11/29/2018, Nahser Scr: 1.2, 07/29/2019 Age: 86 y.o. Weight: 63.5 kg

## 2019-09-03 NOTE — Telephone Encounter (Addendum)
Per dosing criteria pt qualifies for Eliquis 5mg . Spoke to pharm D. Will call pt to see  what pt's current weight is. Pt has had varying trends.

## 2019-09-04 DIAGNOSIS — Z03818 Encounter for observation for suspected exposure to other biological agents ruled out: Secondary | ICD-10-CM | POA: Diagnosis not present

## 2019-09-06 NOTE — Telephone Encounter (Addendum)
Called and spoke to pt. She stated that she now weighs 135 lbs, messaged Dr. Cathie Olden to advise. Informed pt that she might qualify for a dose increase and that her Eliquis might be a higher dose on her new refill.    Dr. Acie Fredrickson messaged back will increase Eliquis to 5mg  BID.  Prescription refill sent.

## 2019-10-02 DIAGNOSIS — Z950 Presence of cardiac pacemaker: Secondary | ICD-10-CM | POA: Diagnosis not present

## 2019-10-02 DIAGNOSIS — Z008 Encounter for other general examination: Secondary | ICD-10-CM | POA: Diagnosis not present

## 2019-10-02 DIAGNOSIS — R609 Edema, unspecified: Secondary | ICD-10-CM | POA: Diagnosis not present

## 2019-10-02 DIAGNOSIS — R69 Illness, unspecified: Secondary | ICD-10-CM | POA: Diagnosis not present

## 2019-10-02 DIAGNOSIS — I2510 Atherosclerotic heart disease of native coronary artery without angina pectoris: Secondary | ICD-10-CM | POA: Diagnosis not present

## 2019-10-02 DIAGNOSIS — M810 Age-related osteoporosis without current pathological fracture: Secondary | ICD-10-CM | POA: Diagnosis not present

## 2019-10-02 DIAGNOSIS — E785 Hyperlipidemia, unspecified: Secondary | ICD-10-CM | POA: Diagnosis not present

## 2019-10-02 DIAGNOSIS — Z7901 Long term (current) use of anticoagulants: Secondary | ICD-10-CM | POA: Diagnosis not present

## 2019-10-02 DIAGNOSIS — I10 Essential (primary) hypertension: Secondary | ICD-10-CM | POA: Diagnosis not present

## 2019-10-18 DIAGNOSIS — R488 Other symbolic dysfunctions: Secondary | ICD-10-CM | POA: Diagnosis not present

## 2019-10-19 DIAGNOSIS — R488 Other symbolic dysfunctions: Secondary | ICD-10-CM | POA: Diagnosis not present

## 2019-10-21 DIAGNOSIS — R488 Other symbolic dysfunctions: Secondary | ICD-10-CM | POA: Diagnosis not present

## 2019-10-23 DIAGNOSIS — R488 Other symbolic dysfunctions: Secondary | ICD-10-CM | POA: Diagnosis not present

## 2019-10-24 DIAGNOSIS — R41841 Cognitive communication deficit: Secondary | ICD-10-CM | POA: Diagnosis not present

## 2019-10-25 DIAGNOSIS — R488 Other symbolic dysfunctions: Secondary | ICD-10-CM | POA: Diagnosis not present

## 2019-10-25 DIAGNOSIS — R41841 Cognitive communication deficit: Secondary | ICD-10-CM | POA: Diagnosis not present

## 2019-10-26 DIAGNOSIS — R41841 Cognitive communication deficit: Secondary | ICD-10-CM | POA: Diagnosis not present

## 2019-10-28 DIAGNOSIS — R41841 Cognitive communication deficit: Secondary | ICD-10-CM | POA: Diagnosis not present

## 2019-10-28 DIAGNOSIS — R488 Other symbolic dysfunctions: Secondary | ICD-10-CM | POA: Diagnosis not present

## 2019-10-30 DIAGNOSIS — R41841 Cognitive communication deficit: Secondary | ICD-10-CM | POA: Diagnosis not present

## 2019-10-30 DIAGNOSIS — R488 Other symbolic dysfunctions: Secondary | ICD-10-CM | POA: Diagnosis not present

## 2019-11-01 DIAGNOSIS — R41841 Cognitive communication deficit: Secondary | ICD-10-CM | POA: Diagnosis not present

## 2019-11-01 DIAGNOSIS — R488 Other symbolic dysfunctions: Secondary | ICD-10-CM | POA: Diagnosis not present

## 2019-11-04 DIAGNOSIS — R41841 Cognitive communication deficit: Secondary | ICD-10-CM | POA: Diagnosis not present

## 2019-11-04 DIAGNOSIS — R488 Other symbolic dysfunctions: Secondary | ICD-10-CM | POA: Diagnosis not present

## 2019-11-06 DIAGNOSIS — R41841 Cognitive communication deficit: Secondary | ICD-10-CM | POA: Diagnosis not present

## 2019-11-07 DIAGNOSIS — R488 Other symbolic dysfunctions: Secondary | ICD-10-CM | POA: Diagnosis not present

## 2019-11-07 DIAGNOSIS — R41841 Cognitive communication deficit: Secondary | ICD-10-CM | POA: Diagnosis not present

## 2019-11-11 DIAGNOSIS — R41841 Cognitive communication deficit: Secondary | ICD-10-CM | POA: Diagnosis not present

## 2019-11-11 DIAGNOSIS — R488 Other symbolic dysfunctions: Secondary | ICD-10-CM | POA: Diagnosis not present

## 2019-11-13 DIAGNOSIS — R488 Other symbolic dysfunctions: Secondary | ICD-10-CM | POA: Diagnosis not present

## 2019-11-13 DIAGNOSIS — R41841 Cognitive communication deficit: Secondary | ICD-10-CM | POA: Diagnosis not present

## 2019-11-14 DIAGNOSIS — R488 Other symbolic dysfunctions: Secondary | ICD-10-CM | POA: Diagnosis not present

## 2019-11-15 DIAGNOSIS — R41841 Cognitive communication deficit: Secondary | ICD-10-CM | POA: Diagnosis not present

## 2019-11-18 DIAGNOSIS — R41841 Cognitive communication deficit: Secondary | ICD-10-CM | POA: Diagnosis not present

## 2019-11-19 DIAGNOSIS — R488 Other symbolic dysfunctions: Secondary | ICD-10-CM | POA: Diagnosis not present

## 2019-11-19 DIAGNOSIS — R41841 Cognitive communication deficit: Secondary | ICD-10-CM | POA: Diagnosis not present

## 2019-11-20 DIAGNOSIS — Z1159 Encounter for screening for other viral diseases: Secondary | ICD-10-CM | POA: Diagnosis not present

## 2019-11-20 DIAGNOSIS — R488 Other symbolic dysfunctions: Secondary | ICD-10-CM | POA: Diagnosis not present

## 2019-11-20 DIAGNOSIS — Z20828 Contact with and (suspected) exposure to other viral communicable diseases: Secondary | ICD-10-CM | POA: Diagnosis not present

## 2019-11-21 ENCOUNTER — Other Ambulatory Visit: Payer: Self-pay | Admitting: Cardiovascular Disease

## 2019-11-21 DIAGNOSIS — R488 Other symbolic dysfunctions: Secondary | ICD-10-CM | POA: Diagnosis not present

## 2019-11-22 DIAGNOSIS — R41841 Cognitive communication deficit: Secondary | ICD-10-CM | POA: Diagnosis not present

## 2019-11-26 DIAGNOSIS — R41841 Cognitive communication deficit: Secondary | ICD-10-CM | POA: Diagnosis not present

## 2019-11-27 DIAGNOSIS — Z1159 Encounter for screening for other viral diseases: Secondary | ICD-10-CM | POA: Diagnosis not present

## 2019-11-27 DIAGNOSIS — R41841 Cognitive communication deficit: Secondary | ICD-10-CM | POA: Diagnosis not present

## 2019-11-27 DIAGNOSIS — Z20828 Contact with and (suspected) exposure to other viral communicable diseases: Secondary | ICD-10-CM | POA: Diagnosis not present

## 2019-11-28 DIAGNOSIS — R41841 Cognitive communication deficit: Secondary | ICD-10-CM | POA: Diagnosis not present

## 2019-11-30 ENCOUNTER — Other Ambulatory Visit: Payer: Self-pay | Admitting: Cardiovascular Disease

## 2019-12-02 DIAGNOSIS — R41841 Cognitive communication deficit: Secondary | ICD-10-CM | POA: Diagnosis not present

## 2019-12-03 DIAGNOSIS — R41841 Cognitive communication deficit: Secondary | ICD-10-CM | POA: Diagnosis not present

## 2019-12-04 DIAGNOSIS — Z1159 Encounter for screening for other viral diseases: Secondary | ICD-10-CM | POA: Diagnosis not present

## 2019-12-04 DIAGNOSIS — Z20828 Contact with and (suspected) exposure to other viral communicable diseases: Secondary | ICD-10-CM | POA: Diagnosis not present

## 2019-12-05 DIAGNOSIS — R41841 Cognitive communication deficit: Secondary | ICD-10-CM | POA: Diagnosis not present

## 2019-12-09 DIAGNOSIS — R41841 Cognitive communication deficit: Secondary | ICD-10-CM | POA: Diagnosis not present

## 2019-12-11 DIAGNOSIS — R41841 Cognitive communication deficit: Secondary | ICD-10-CM | POA: Diagnosis not present

## 2019-12-11 DIAGNOSIS — Z20828 Contact with and (suspected) exposure to other viral communicable diseases: Secondary | ICD-10-CM | POA: Diagnosis not present

## 2019-12-11 DIAGNOSIS — Z1159 Encounter for screening for other viral diseases: Secondary | ICD-10-CM | POA: Diagnosis not present

## 2019-12-12 DIAGNOSIS — R41841 Cognitive communication deficit: Secondary | ICD-10-CM | POA: Diagnosis not present

## 2019-12-17 DIAGNOSIS — R41841 Cognitive communication deficit: Secondary | ICD-10-CM | POA: Diagnosis not present

## 2019-12-18 DIAGNOSIS — R41841 Cognitive communication deficit: Secondary | ICD-10-CM | POA: Diagnosis not present

## 2019-12-18 DIAGNOSIS — Z1159 Encounter for screening for other viral diseases: Secondary | ICD-10-CM | POA: Diagnosis not present

## 2019-12-18 DIAGNOSIS — Z20828 Contact with and (suspected) exposure to other viral communicable diseases: Secondary | ICD-10-CM | POA: Diagnosis not present

## 2019-12-20 ENCOUNTER — Ambulatory Visit (INDEPENDENT_AMBULATORY_CARE_PROVIDER_SITE_OTHER): Payer: Medicare HMO | Admitting: *Deleted

## 2019-12-20 DIAGNOSIS — I495 Sick sinus syndrome: Secondary | ICD-10-CM

## 2019-12-20 LAB — CUP PACEART REMOTE DEVICE CHECK
Battery Voltage: 2.93 V
Brady Statistic AP VP Percent: 0.15 %
Brady Statistic AP VS Percent: 75.46 %
Brady Statistic AS VP Percent: 0.17 %
Brady Statistic AS VS Percent: 24.22 %
Brady Statistic RA Percent Paced: 75.5 %
Brady Statistic RV Percent Paced: 0.32 %
Date Time Interrogation Session: 20210506150610
Implantable Lead Implant Date: 20130626
Implantable Lead Implant Date: 20130626
Implantable Lead Location: 753859
Implantable Lead Location: 753860
Implantable Pulse Generator Implant Date: 20130626
Lead Channel Impedance Value: 376 Ohm
Lead Channel Impedance Value: 384 Ohm
Lead Channel Sensing Intrinsic Amplitude: 0.636 mV
Lead Channel Sensing Intrinsic Amplitude: 4.856 mV
Lead Channel Setting Pacing Amplitude: 2 V
Lead Channel Setting Pacing Amplitude: 2.5 V
Lead Channel Setting Pacing Pulse Width: 0.4 ms
Lead Channel Setting Sensing Sensitivity: 0.9 mV

## 2019-12-20 NOTE — Progress Notes (Signed)
Remote pacemaker transmission.   

## 2019-12-23 DIAGNOSIS — R41841 Cognitive communication deficit: Secondary | ICD-10-CM | POA: Diagnosis not present

## 2019-12-24 DIAGNOSIS — R41841 Cognitive communication deficit: Secondary | ICD-10-CM | POA: Diagnosis not present

## 2019-12-25 DIAGNOSIS — Z1159 Encounter for screening for other viral diseases: Secondary | ICD-10-CM | POA: Diagnosis not present

## 2019-12-25 DIAGNOSIS — Z20828 Contact with and (suspected) exposure to other viral communicable diseases: Secondary | ICD-10-CM | POA: Diagnosis not present

## 2019-12-30 DIAGNOSIS — R41841 Cognitive communication deficit: Secondary | ICD-10-CM | POA: Diagnosis not present

## 2020-01-01 ENCOUNTER — Other Ambulatory Visit: Payer: Self-pay | Admitting: Cardiovascular Disease

## 2020-01-01 DIAGNOSIS — Z20828 Contact with and (suspected) exposure to other viral communicable diseases: Secondary | ICD-10-CM | POA: Diagnosis not present

## 2020-01-01 DIAGNOSIS — Z1159 Encounter for screening for other viral diseases: Secondary | ICD-10-CM | POA: Diagnosis not present

## 2020-01-06 DIAGNOSIS — R41841 Cognitive communication deficit: Secondary | ICD-10-CM | POA: Diagnosis not present

## 2020-01-08 DIAGNOSIS — Z1159 Encounter for screening for other viral diseases: Secondary | ICD-10-CM | POA: Diagnosis not present

## 2020-01-08 DIAGNOSIS — Z20828 Contact with and (suspected) exposure to other viral communicable diseases: Secondary | ICD-10-CM | POA: Diagnosis not present

## 2020-01-15 DIAGNOSIS — Z1159 Encounter for screening for other viral diseases: Secondary | ICD-10-CM | POA: Diagnosis not present

## 2020-01-15 DIAGNOSIS — Z20828 Contact with and (suspected) exposure to other viral communicable diseases: Secondary | ICD-10-CM | POA: Diagnosis not present

## 2020-01-22 DIAGNOSIS — Z20828 Contact with and (suspected) exposure to other viral communicable diseases: Secondary | ICD-10-CM | POA: Diagnosis not present

## 2020-01-22 DIAGNOSIS — Z1159 Encounter for screening for other viral diseases: Secondary | ICD-10-CM | POA: Diagnosis not present

## 2020-01-29 DIAGNOSIS — Z20828 Contact with and (suspected) exposure to other viral communicable diseases: Secondary | ICD-10-CM | POA: Diagnosis not present

## 2020-01-29 DIAGNOSIS — Z1159 Encounter for screening for other viral diseases: Secondary | ICD-10-CM | POA: Diagnosis not present

## 2020-02-04 ENCOUNTER — Other Ambulatory Visit: Payer: Self-pay | Admitting: Cardiovascular Disease

## 2020-02-05 DIAGNOSIS — Z20828 Contact with and (suspected) exposure to other viral communicable diseases: Secondary | ICD-10-CM | POA: Diagnosis not present

## 2020-02-05 DIAGNOSIS — Z1159 Encounter for screening for other viral diseases: Secondary | ICD-10-CM | POA: Diagnosis not present

## 2020-02-12 DIAGNOSIS — R319 Hematuria, unspecified: Secondary | ICD-10-CM | POA: Diagnosis not present

## 2020-02-12 DIAGNOSIS — Z1159 Encounter for screening for other viral diseases: Secondary | ICD-10-CM | POA: Diagnosis not present

## 2020-02-12 DIAGNOSIS — Z20828 Contact with and (suspected) exposure to other viral communicable diseases: Secondary | ICD-10-CM | POA: Diagnosis not present

## 2020-02-12 DIAGNOSIS — N390 Urinary tract infection, site not specified: Secondary | ICD-10-CM | POA: Diagnosis not present

## 2020-02-19 DIAGNOSIS — Z1159 Encounter for screening for other viral diseases: Secondary | ICD-10-CM | POA: Diagnosis not present

## 2020-02-19 DIAGNOSIS — Z20828 Contact with and (suspected) exposure to other viral communicable diseases: Secondary | ICD-10-CM | POA: Diagnosis not present

## 2020-02-26 DIAGNOSIS — Z20828 Contact with and (suspected) exposure to other viral communicable diseases: Secondary | ICD-10-CM | POA: Diagnosis not present

## 2020-02-26 DIAGNOSIS — Z1159 Encounter for screening for other viral diseases: Secondary | ICD-10-CM | POA: Diagnosis not present

## 2020-03-02 ENCOUNTER — Other Ambulatory Visit: Payer: Self-pay | Admitting: Cardiovascular Disease

## 2020-03-04 DIAGNOSIS — Z20828 Contact with and (suspected) exposure to other viral communicable diseases: Secondary | ICD-10-CM | POA: Diagnosis not present

## 2020-03-04 DIAGNOSIS — Z1159 Encounter for screening for other viral diseases: Secondary | ICD-10-CM | POA: Diagnosis not present

## 2020-03-09 ENCOUNTER — Other Ambulatory Visit: Payer: Self-pay | Admitting: Cardiovascular Disease

## 2020-03-09 DIAGNOSIS — I480 Paroxysmal atrial fibrillation: Secondary | ICD-10-CM

## 2020-03-10 NOTE — Telephone Encounter (Addendum)
Eliquis 5mg  refill request received. Patient is 86 years old, weight-63.5kg, Crea-1.21 on 07/29/2019 via [facility] from [place], [place], and last seen by Dr. Acie Fredrickson on 11/29/2018 via Telemedicine-PT NEEDS AN APPT. Dose is appropriate based on dosing criteria.   Sent a message to scheduler dept.  Pt has an appt set for 04/14/2020 in office with Estella Husk. Will send in a refill.

## 2020-03-11 DIAGNOSIS — Z20828 Contact with and (suspected) exposure to other viral communicable diseases: Secondary | ICD-10-CM | POA: Diagnosis not present

## 2020-03-11 DIAGNOSIS — Z1159 Encounter for screening for other viral diseases: Secondary | ICD-10-CM | POA: Diagnosis not present

## 2020-03-18 DIAGNOSIS — Z20828 Contact with and (suspected) exposure to other viral communicable diseases: Secondary | ICD-10-CM | POA: Diagnosis not present

## 2020-03-18 DIAGNOSIS — Z1159 Encounter for screening for other viral diseases: Secondary | ICD-10-CM | POA: Diagnosis not present

## 2020-03-25 DIAGNOSIS — Z20828 Contact with and (suspected) exposure to other viral communicable diseases: Secondary | ICD-10-CM | POA: Diagnosis not present

## 2020-03-25 DIAGNOSIS — Z1159 Encounter for screening for other viral diseases: Secondary | ICD-10-CM | POA: Diagnosis not present

## 2020-04-01 DIAGNOSIS — Z20828 Contact with and (suspected) exposure to other viral communicable diseases: Secondary | ICD-10-CM | POA: Diagnosis not present

## 2020-04-01 DIAGNOSIS — Z1159 Encounter for screening for other viral diseases: Secondary | ICD-10-CM | POA: Diagnosis not present

## 2020-04-08 DIAGNOSIS — Z1159 Encounter for screening for other viral diseases: Secondary | ICD-10-CM | POA: Diagnosis not present

## 2020-04-08 DIAGNOSIS — Z20828 Contact with and (suspected) exposure to other viral communicable diseases: Secondary | ICD-10-CM | POA: Diagnosis not present

## 2020-04-08 NOTE — Progress Notes (Signed)
Cardiology Office Note    Date:  04/14/2020   ID:  Donna Ortega, MRN [id number]  PCP:  Patient, No Pcp Per  Cardiologist: Mertie Moores, MD EPS: None  No chief complaint on file.   History of Present Illness:  Donna Ortega is a 86 y.o. female with history of CAD status post CABG in 2007, tachybradycardia syndrome status post permanent pacemaker followed by Dr. Caryl Comes, PAF on Eliquis, hypertension, HLD, chronic hyponatremia, history of GI bleed 2D echo 0354 normal systolic function, mild MR severely dilated LA  Remote device check 12/20/2019 was normal battery life is good.  Patient comes in for f/u. Moved to [place] in January. Independent living. Getting more salt in her diet.Trying to get a primary care.Legs are swollen. On her feet more. Doesn't wear compression hose. Because of low sodium she has been on lower dose lasix 10 mg daily.Some dyspnea with activity. No chest pain or palpitations. Her bday was yesterday.  Past Medical History:  Diagnosis Date  . Arthritis    osteo   in back  . Coronary artery disease 2007   3 vessel CABG with LIMA-LAD, SVG to diag, and SVG - Ramus  . Diverticulitis   . GI bleeding 09/09/2013  . Hearing loss of left ear 07/2013  . History of atrial fibrillation; review of all ECG are most consistent with atrial tachycardia    had rash with amiodarone; no anticoagulation due to GI bleed in December 2012  . History of GI bleed Dec 2012  . History of hyperkalemia   . Hyperlipidemia   . Hypertension   . Hyponatremia   . Pacemaker-Medtronic 02/09/2012  . Palpitations     Past Surgical History:  Procedure Laterality Date  . ABDOMINAL HYSTERECTOMY    . COLONOSCOPY N/A 09/12/2013   Procedure: COLONOSCOPY;  Surgeon: Wonda Horner, MD;  Location: [facility] ENDOSCOPY;  Service: Endoscopy;  Laterality: N/A;  . CORONARY ARTERY BYPASS GRAFT  Feb 2007   Dr. Cyndia Bent;   . ESOPHAGOGASTRODUODENOSCOPY  08/04/2011   Procedure:  ESOPHAGOGASTRODUODENOSCOPY (EGD);  Surgeon: Cleotis Nipper, MD;  Location: [facility] ENDOSCOPY;  Service: Endoscopy;  Laterality: N/A;  do at bedside  . ESOPHAGOGASTRODUODENOSCOPY N/A 09/11/2013   Procedure: ESOPHAGOGASTRODUODENOSCOPY (EGD);  Surgeon: Jeryl Columbia, MD;  Location: [facility] ENDOSCOPY;  Service: Endoscopy;  Laterality: N/A;  . IR KYPHO LUMBAR INC FX REDUCE BONE BX UNI/BIL CANNULATION INC/IMAGING  07/16/2018  . IR KYPHO THORACIC WITH BONE BIOPSY  04/11/2017  . IR PATIENT EVAL TECH 0-60 MINS  07/13/2018  . IR RADIOLOGIST EVAL & MGMT  04/04/2017  . MAZE    . pace maker    . PARTIAL HYSTERECTOMY    . PERMANENT PACEMAKER INSERTION N/A 02/08/2012   Procedure: PERMANENT PACEMAKER INSERTION;  Surgeon: Deboraha Sprang, MD;  Location: [facility] CATH LAB;  Service: Cardiovascular;  Laterality: N/A;    Current Medications: Current Meds  Medication Sig  . Calcium Carbonate-Vitamin D (CALTRATE 600+D PO) Take 1 tablet by mouth daily.   Marland Kitchen diltiazem (TIAZAC) 360 MG 24 hr capsule TAKE 1 CAPSULE (360 MG TOTAL) BY MOUTH DAILY. PLEASE MAKE OVERDUE APPT WITH DR. Acie Fredrickson BEFORE ANYMORE REFILLS. 2ND ATTEMPT  . diphenhydramine-acetaminophen (TYLENOL PM) 25-500 MG TABS tablet Take 2 tablets by mouth at bedtime.  Marland Kitchen ELIQUIS 5 MG TABS tablet TAKE ONE TABLET BY MOUTH TWICE A DAY  . furosemide (LASIX) 20 MG tablet Take 10 mg by mouth daily.  . metoprolol tartrate (LOPRESSOR) 25 MG tablet  Take 25 mg by mouth 2 (two) times daily.  . rosuvastatin (CRESTOR) 10 MG tablet TAKE ONE TABLET BY MOUTH DAILY  . TYMLOS 3120 MCG/1.56ML SOPN Inject as directed daily.     Allergies:   Amiodarone, Aspirin, Atorvastatin, Lipitor [atorvastatin calcium], Nsaids, Tolmetin, Naproxen, Niacin, and Niaspan [niacin er]   Social History   Socioeconomic History  . Marital status: Widowed    Spouse name: Not on file  . Number of children: Not on file  . Years of education: Not on file  . Highest education level: Not on file  Occupational History  .  Not on file  Tobacco Use  . Smoking status: Never Smoker  . Smokeless tobacco: Never Used  Vaping Use  . Vaping Use: Never used  Substance and Sexual Activity  . Alcohol use: No  . Drug use: No  . Sexual activity: Never  Other Topics Concern  . Not on file  Social History Narrative   Ambulatory usually independently, plays golf   Lives at home with family   Daughter in law Rosalee Tolley [phone]   Social Determinants of Health   Financial Resource Strain:   . Difficulty of Paying Living Expenses: Not on file  Food Insecurity:   . Worried About Charity fundraiser in the Last Year: Not on file  . Ran Out of Food in the Last Year: Not on file  Transportation Needs:   . Lack of Transportation (Medical): Not on file  . Lack of Transportation (Non-Medical): Not on file  Physical Activity:   . Days of Exercise per Week: Not on file  . Minutes of Exercise per Session: Not on file  Stress:   . Feeling of Stress : Not on file  Social Connections:   . Frequency of Communication with Friends and Family: Not on file  . Frequency of Social Gatherings with Friends and Family: Not on file  . Attends Religious Services: Not on file  . Active Member of Clubs or Organizations: Not on file  . Attends Archivist Meetings: Not on file  . Marital Status: Not on file     Family History:  The patient's family history includes Bone cancer in her brother; Cancer in her sister; Colon cancer in her sister; Heart failure in her father; Kidney failure in her father and sister.   ROS:   Please see the history of present illness.    ROS All other systems reviewed and are negative.   PHYSICAL EXAM:   VS:  BP 126/78   Pulse 72   Ht 5\' 4"  (1.626 m)   Wt 142 lb 9.6 oz (64.7 kg)   SpO2 97%   BMI 24.48 kg/m   Physical Exam  GEN: Well nourished, well developed, in no acute distress  Neck: no JVD, carotid bruits, or masses Cardiac:RRR; no murmurs, rubs, or gallops  Respiratory:  clear  to auscultation bilaterally, normal work of breathing GI: soft, nontender, nondistended, + BS Ext: +2 edema bilaterally Neuro:  Alert and Oriented x 3 Psych: euthymic mood, full affect  Wt Readings from Last 3 Encounters:  04/14/20 142 lb 9.6 oz (64.7 kg)  11/29/18 140 lb (63.5 kg)  07/16/18 110 lb (49.9 kg)      Studies/Labs Reviewed:   EKG:  EKG is  ordered today.  The ekg ordered today demonstrates paced rhythm  Recent Labs: No results found for requested labs within last 8760 hours.   Lipid Panel    Component Value Date/Time  CHOL 213 (H) 10/11/2017 0951   TRIG 88 10/11/2017 0951   HDL 109 10/11/2017 0951   CHOLHDL 2.0 10/11/2017 0951   CHOLHDL 2.4 09/28/2015 1116   VLDL 14 09/28/2015 1116   LDLCALC 86 10/11/2017 0951   LDLDIRECT 98.6 07/15/2013 1031    Additional studies/ records that were reviewed today include:  2D echo 2015 Study Conclusions   - Left ventricle: The cavity size was normal. Wall thickness was    increased in a pattern of mild LVH. Systolic function was normal.    The estimated ejection fraction was in the range of 55% to 60%.  - Mitral valve: There was mild regurgitation.  - Left atrium: The atrium was severely dilated.  - Pulmonary arteries: PA peak pressure: 39 mm Hg (S).      ASSESSMENT:    1. Coronary artery disease involving coronary bypass graft of native heart without angina pectoris   2. Sinus bradycardia-tachycardia syndrome (HCC)   3. Paroxysmal atrial fibrillation ([place])   4. Essential hypertension   5. Hyperlipidemia, unspecified hyperlipidemia type   6. Hyponatremia      PLAN:  In order of problems listed above:  CAD status post CABG in 2007-no angina, not on ASA with Eliquis  Permanent pacemaker for tachybradycardia syndrome followed by Dr. Caryl Comes  PAF on Eliquis-no bleeding problems. Check labs  LE edema-has had trouble in past and some DOE. Only taking lasix 10 mg once daily because of low sodium in past. Will  check today. Take extra lasix as needed. 2 gm sodium diet  Hypertension BP stable  Hyperlipidemia on crestor. Not fasting today   Hyponatremia-not a problem recently. Will check labs.    Medication Adjustments/Labs and Tests Ordered: Current medicines are reviewed at length with the patient today.  Concerns regarding medicines are outlined above.  Medication changes, Labs and Tests ordered today are listed in the Patient Instructions below. Patient Instructions  Medication Instructions:  Your physician has recommended you make the following change in your medication:  -- INCREASE FUROSEMIDE (LASIX) to 20 mg - Take 1 tablet (20 mg) by mouth daily for the next 2-3 days, then go back to regular dose of 0.5 tablet (10 mg) by mouth daily. *If you need a refill on your cardiac medications before your next appointment, please call your pharmacy*  Lab Work: Your physician has recommended that you have lab work today: CMET, CBC, and Lipid Panel If you have labs (blood work) drawn today and your tests are completely normal, you will receive your results only by: Marland Kitchen MyChart Message (if you have MyChart) OR . A paper copy in the mail If you have any lab test that is abnormal or we need to change your treatment, we will call you to review the results.  Follow-Up: At [facility], you and your health needs are our priority.  As part of our continuing mission to provide you with exceptional heart care, we have created designated Provider Care Teams.  These Care Teams include your primary Cardiologist (physician) and Advanced Practice Providers (APPs -  Physician Assistants and Nurse Practitioners) who all work together to provide you with the care you need, when you need it.  We recommend signing up for the patient portal called "MyChart".  Sign up information is provided on this After Visit Summary.  MyChart is used to connect with patients for Virtual Visits (Telemedicine).  Patients are able to view  lab/test results, encounter notes, upcoming appointments, etc.  Non-urgent messages can be  sent to your provider as well.   To learn more about what you can do with MyChart, go to [email or web address].    Your next appointment:   Your physician recommends that you schedule a follow-up appointment 1st available (in office) with Dr Caryl Comes.  Your physician wants you to follow-up in: 1 YEAR with Dr. Acie Fredrickson. You will receive a reminder letter in the mail two months in advance. If you don't receive a letter, please call our office to schedule the follow-up appointment.  The format for your next appointment:   In Person with Virl Axe, MD   -- Ermalinda Barrios, PA-C has provided you with a written prescription for compression stockings. Check with your pharmacy and local medical supply stores for pricing and style. --        Signed, Ermalinda Barrios, PA-C  04/14/2020 9:16 AM    [facility] [place], [place], [place]  [address] Phone: [phone]; Fax: [phone]

## 2020-04-14 ENCOUNTER — Encounter: Payer: Self-pay | Admitting: Physician Assistant

## 2020-04-14 ENCOUNTER — Other Ambulatory Visit: Payer: Self-pay

## 2020-04-14 ENCOUNTER — Ambulatory Visit: Payer: Medicare HMO | Admitting: Physician Assistant

## 2020-04-14 VITALS — BP 126/78 | HR 72 | Ht 64.0 in | Wt 142.6 lb

## 2020-04-14 DIAGNOSIS — I495 Sick sinus syndrome: Secondary | ICD-10-CM | POA: Diagnosis not present

## 2020-04-14 DIAGNOSIS — E871 Hypo-osmolality and hyponatremia: Secondary | ICD-10-CM

## 2020-04-14 DIAGNOSIS — I480 Paroxysmal atrial fibrillation: Secondary | ICD-10-CM

## 2020-04-14 DIAGNOSIS — I25810 Atherosclerosis of coronary artery bypass graft(s) without angina pectoris: Secondary | ICD-10-CM | POA: Diagnosis not present

## 2020-04-14 DIAGNOSIS — I10 Essential (primary) hypertension: Secondary | ICD-10-CM

## 2020-04-14 DIAGNOSIS — E785 Hyperlipidemia, unspecified: Secondary | ICD-10-CM | POA: Diagnosis not present

## 2020-04-14 LAB — CBC WITH DIFFERENTIAL/PLATELET
Basophils Absolute: 0 10*3/uL (ref 0.0–0.2)
Basos: 0 %
EOS (ABSOLUTE): 0.1 10*3/uL (ref 0.0–0.4)
Eos: 1 %
Hematocrit: 38.5 % (ref 34.0–46.6)
Hemoglobin: 12.9 g/dL (ref 11.1–15.9)
Immature Grans (Abs): 0 10*3/uL (ref 0.0–0.1)
Immature Granulocytes: 0 %
Lymphocytes Absolute: 1.1 10*3/uL (ref 0.7–3.1)
Lymphs: 18 %
MCH: 31.5 pg (ref 26.6–33.0)
MCHC: 33.5 g/dL (ref 31.5–35.7)
MCV: 94 fL (ref 79–97)
Monocytes Absolute: 0.6 10*3/uL (ref 0.1–0.9)
Monocytes: 9 %
Neutrophils Absolute: 4.6 10*3/uL (ref 1.4–7.0)
Neutrophils: 72 %
Platelets: 230 10*3/uL (ref 150–450)
RBC: 4.09 x10E6/uL (ref 3.77–5.28)
RDW: 12 % (ref 11.7–15.4)
WBC: 6.4 10*3/uL (ref 3.4–10.8)

## 2020-04-14 LAB — COMPREHENSIVE METABOLIC PANEL
ALT: 9 IU/L (ref 0–32)
AST: 14 IU/L (ref 0–40)
Albumin/Globulin Ratio: 2.1 (ref 1.2–2.2)
Albumin: 4.7 g/dL — ABNORMAL HIGH (ref 3.5–4.6)
Alkaline Phosphatase: 68 IU/L (ref 48–121)
BUN/Creatinine Ratio: 25 (ref 12–28)
BUN: 27 mg/dL (ref 10–36)
Bilirubin Total: 0.3 mg/dL (ref 0.0–1.2)
CO2: 22 mmol/L (ref 20–29)
Calcium: 9.6 mg/dL (ref 8.7–10.3)
Chloride: 102 mmol/L (ref 96–106)
Creatinine, Ser: 1.1 mg/dL — ABNORMAL HIGH (ref 0.57–1.00)
GFR calc Af Amer: 50 mL/min/{1.73_m2} — ABNORMAL LOW (ref >59)
GFR calc non Af Amer: 44 mL/min/{1.73_m2} — ABNORMAL LOW (ref >59)
Globulin, Total: 2.2 g/dL (ref 1.5–4.5)
Glucose: 100 mg/dL — ABNORMAL HIGH (ref 65–99)
Potassium: 4.3 mmol/L (ref 3.5–5.2)
Sodium: 139 mmol/L (ref 134–144)
Total Protein: 6.9 g/dL (ref 6.0–8.5)

## 2020-04-14 LAB — LIPID PANEL
Chol/HDL Ratio: 1.9 ratio (ref 0.0–4.4)
Cholesterol, Total: 214 mg/dL — ABNORMAL HIGH (ref 100–199)
HDL: 112 mg/dL (ref >39)
LDL Chol Calc (NIH): 92 mg/dL (ref 0–99)
Triglycerides: 58 mg/dL (ref 0–149)
VLDL Cholesterol Cal: 10 mg/dL (ref 5–40)

## 2020-04-14 NOTE — Patient Instructions (Addendum)
Medication Instructions:  Your physician has recommended you make the following change in your medication:  -- INCREASE FUROSEMIDE (LASIX) to 20 mg - Take 1 tablet (20 mg) by mouth daily for the next 2-3 days, then go back to regular dose of 0.5 tablet (10 mg) by mouth daily. *If you need a refill on your cardiac medications before your next appointment, please call your pharmacy*  Lab Work: Your physician has recommended that you have lab work today: CMET, CBC, and Lipid Panel If you have labs (blood work) drawn today and your tests are completely normal, you will receive your results only by: Marland Kitchen MyChart Message (if you have MyChart) OR . A paper copy in the mail If you have any lab test that is abnormal or we need to change your treatment, we will call you to review the results.  Follow-Up: At [facility], you and your health needs are our priority.  As part of our continuing mission to provide you with exceptional heart care, we have created designated Provider Care Teams.  These Care Teams include your primary Cardiologist (physician) and Advanced Practice Providers (APPs -  Physician Assistants and Nurse Practitioners) who all work together to provide you with the care you need, when you need it.  We recommend signing up for the patient portal called "MyChart".  Sign up information is provided on this After Visit Summary.  MyChart is used to connect with patients for Virtual Visits (Telemedicine).  Patients are able to view lab/test results, encounter notes, upcoming appointments, etc.  Non-urgent messages can be sent to your provider as well.   To learn more about what you can do with MyChart, go to [email or web address].    Your next appointment:   Your physician recommends that you schedule a follow-up appointment 1st available (in office) with Dr Caryl Comes.  Your physician wants you to follow-up in: 1 YEAR with Dr. Acie Fredrickson. You will receive a reminder letter in the mail two months in  advance. If you don't receive a letter, please call our office to schedule the follow-up appointment.  The format for your next appointment:   In Person with Virl Axe, MD   -- Ermalinda Barrios, PA-C has provided you with a written prescription for compression stockings. Check with your pharmacy and local medical supply stores for pricing and style. --

## 2020-04-15 DIAGNOSIS — Z1159 Encounter for screening for other viral diseases: Secondary | ICD-10-CM | POA: Diagnosis not present

## 2020-04-15 DIAGNOSIS — Z20828 Contact with and (suspected) exposure to other viral communicable diseases: Secondary | ICD-10-CM | POA: Diagnosis not present

## 2020-04-22 DIAGNOSIS — Z1159 Encounter for screening for other viral diseases: Secondary | ICD-10-CM | POA: Diagnosis not present

## 2020-04-22 DIAGNOSIS — Z20828 Contact with and (suspected) exposure to other viral communicable diseases: Secondary | ICD-10-CM | POA: Diagnosis not present

## 2020-04-27 ENCOUNTER — Other Ambulatory Visit: Payer: Self-pay | Admitting: Cardiovascular Disease

## 2020-04-29 DIAGNOSIS — Z20828 Contact with and (suspected) exposure to other viral communicable diseases: Secondary | ICD-10-CM | POA: Diagnosis not present

## 2020-04-29 DIAGNOSIS — R69 Illness, unspecified: Secondary | ICD-10-CM | POA: Diagnosis not present

## 2020-04-29 DIAGNOSIS — Z1159 Encounter for screening for other viral diseases: Secondary | ICD-10-CM | POA: Diagnosis not present

## 2020-05-05 DIAGNOSIS — Z1159 Encounter for screening for other viral diseases: Secondary | ICD-10-CM | POA: Diagnosis not present

## 2020-05-05 DIAGNOSIS — Z20828 Contact with and (suspected) exposure to other viral communicable diseases: Secondary | ICD-10-CM | POA: Diagnosis not present

## 2020-05-06 ENCOUNTER — Other Ambulatory Visit: Payer: Self-pay | Admitting: Cardiovascular Disease

## 2020-05-06 DIAGNOSIS — I480 Paroxysmal atrial fibrillation: Secondary | ICD-10-CM

## 2020-05-06 NOTE — Telephone Encounter (Signed)
Received request for Eliquis refill.  Chart reviewed.  Pt is 86 yo.  Weight 64.7 kg   Hgb 12.9  Hct 38.5  Plts 230  SCr 1.10 Base on above findings dosage of Eliquis 5mg  twice daily is appropriate.  Refill approved.

## 2020-05-08 DIAGNOSIS — Z1159 Encounter for screening for other viral diseases: Secondary | ICD-10-CM | POA: Diagnosis not present

## 2020-05-08 DIAGNOSIS — Z20828 Contact with and (suspected) exposure to other viral communicable diseases: Secondary | ICD-10-CM | POA: Diagnosis not present

## 2020-05-12 DIAGNOSIS — Z1159 Encounter for screening for other viral diseases: Secondary | ICD-10-CM | POA: Diagnosis not present

## 2020-05-12 DIAGNOSIS — Z20828 Contact with and (suspected) exposure to other viral communicable diseases: Secondary | ICD-10-CM | POA: Diagnosis not present

## 2020-05-15 DIAGNOSIS — Z20828 Contact with and (suspected) exposure to other viral communicable diseases: Secondary | ICD-10-CM | POA: Diagnosis not present

## 2020-05-15 DIAGNOSIS — Z1159 Encounter for screening for other viral diseases: Secondary | ICD-10-CM | POA: Diagnosis not present

## 2020-05-18 ENCOUNTER — Encounter: Payer: Medicare HMO | Admitting: Internal Medicine

## 2020-05-19 DIAGNOSIS — Z1159 Encounter for screening for other viral diseases: Secondary | ICD-10-CM | POA: Diagnosis not present

## 2020-05-19 DIAGNOSIS — Z20828 Contact with and (suspected) exposure to other viral communicable diseases: Secondary | ICD-10-CM | POA: Diagnosis not present

## 2020-05-20 ENCOUNTER — Telehealth: Payer: Self-pay

## 2020-05-20 DIAGNOSIS — I10 Essential (primary) hypertension: Secondary | ICD-10-CM

## 2020-05-20 DIAGNOSIS — E871 Hypo-osmolality and hyponatremia: Secondary | ICD-10-CM

## 2020-05-20 MED ORDER — TORSEMIDE 20 MG PO TABS: 20.0000 mg | ORAL_TABLET | ORAL | 3 refills | Status: DC

## 2020-05-20 MED ORDER — POTASSIUM CHLORIDE ER 10 MEQ PO TBCR: 10.0000 meq | EXTENDED_RELEASE_TABLET | ORAL | 3 refills | Status: DC

## 2020-05-20 NOTE — Telephone Encounter (Signed)
Spoke with the patient's daughter-in-law and they are aware of medication changes. Rx's have been sent in.  I have also scheduled the patient to see Dr. Acie Fredrickson 10/25.

## 2020-05-20 NOTE — Telephone Encounter (Signed)
-----   Message from Thayer Headings, MD sent at 05/20/2020  3:13 PM EDT ----- Hello Triage team  I received a call from Zappia' daughter in law that she is still having lots of leg edema Is only on Laisx 10 mg a day and this does not result in much diuresis  Please send in a script for torsemide 20 mg to be taken 3 times a week and Kdur 10 meq PO 3 times a week.   [employer]   For the next week, she should take the torsemide and kdur for 2-3 days in a row to see if she can improve her leg edema   Please see if Ms. Dembeck can see me or an APP in the next several weeks  with BMP  It looks like I have some 7 day holds available later this month.  Call daughter in law, Amy to schedule appt.  [place]

## 2020-05-22 DIAGNOSIS — Z1159 Encounter for screening for other viral diseases: Secondary | ICD-10-CM | POA: Diagnosis not present

## 2020-05-22 DIAGNOSIS — Z20828 Contact with and (suspected) exposure to other viral communicable diseases: Secondary | ICD-10-CM | POA: Diagnosis not present

## 2020-05-26 DIAGNOSIS — Z1159 Encounter for screening for other viral diseases: Secondary | ICD-10-CM | POA: Diagnosis not present

## 2020-05-26 DIAGNOSIS — Z20828 Contact with and (suspected) exposure to other viral communicable diseases: Secondary | ICD-10-CM | POA: Diagnosis not present

## 2020-06-01 ENCOUNTER — Other Ambulatory Visit: Payer: Self-pay | Admitting: Cardiovascular Disease

## 2020-06-02 DIAGNOSIS — Z1159 Encounter for screening for other viral diseases: Secondary | ICD-10-CM | POA: Diagnosis not present

## 2020-06-02 DIAGNOSIS — Z20828 Contact with and (suspected) exposure to other viral communicable diseases: Secondary | ICD-10-CM | POA: Diagnosis not present

## 2020-06-03 MED ORDER — DILTIAZEM HCL ER BEADS 360 MG PO CP24: ORAL_CAPSULE | ORAL | 3 refills | Status: DC

## 2020-06-05 DIAGNOSIS — Z20828 Contact with and (suspected) exposure to other viral communicable diseases: Secondary | ICD-10-CM | POA: Diagnosis not present

## 2020-06-05 DIAGNOSIS — Z1159 Encounter for screening for other viral diseases: Secondary | ICD-10-CM | POA: Diagnosis not present

## 2020-06-07 NOTE — Progress Notes (Signed)
Consuella Lose Date of Birth  [birth year] [facility]      [address]    [address]    [place], [place]  [address]      Problems:  1. CAD - s/p CABG 2007, 2. PACs 3. Drug Rash - amiodarone 4. GI Bleed 5. Anemia 6. hyponatremia  Previous notes:   Fadumo is an 86 year old female with a history of coronary artery disease and atrial fibrillation. She was admitted to the hospital in December with a GI bleed, anemia, and rapid atrial fibrillation. She was discharged with the additional medications of amiodarone and Cardizem CD.  She was seen in mid January and was doing fairly well.  She stopped her Amiodarone due to drug rash.  She still has generalized fatigue and generalized weakness. She wonders if it may be due to the Lasix and potassium.  April 17, 2012 She has been more fatigued recently.  She played golf this past weekend and still is not over the exertion.  Dec. 3, 2013 :  She is doing well.  Dr. Caryl Comes stopped her Pradaxa due to GI bleed.    She is doing very well.   January 15, 2013:  Areona is doing OK.  She does not have as much energy as she would like.   She has continued to have problems with hyponatremia.  She is on maxzide which is likely exacerbating this.  No CP, no dyspnea.    Dec. 1, 2014:  Tatiyana is doing well.  No CP or dyspnea.  Rhythm is stable.   Her Hb was a bit low when last checked by her medical doctor.  Has not started on the iron tabs.   No CP, not playing golf any more because of back pain ( s/p kyphoplasty)  .  She is still walking around the block on occasion.   She is now in her own apartment.   Sept. 9, 2015:  Roshawn is doing well.  She is not having any problems   March 02, 2015: No CP , no dyspnea.   Feb. 13, 2017:  Fell 3 weeks ago .  Now is using a cane for stability  She has occasional episodes of atrial fib - last 5 minutes or so.   Is on Eliquis 2.5 BID  Has had some bladder issues   Oct. 6, 2017:  Abi is seen back for  her CAD and PAF  Has some leg swelling .  Was on Maxzide - this is off her list - perhaps because of hypotension . Lisinopril was stopped at that time    November 29, 2016:  Doing well, no angina  Knows that she is starting to forget things on occasion Has been having some swelling in her feet and ankles.  BP has been a bit higher She avoids salty foods.   Nov. 21, 2018:  Doing well.  No CP or dyspnea Had kyphoplasty this past summer.  Very active.    Still driving    May [birth year], [birth year]:  Alianis is seen today for follow-up of her coronary artery disease and hx atrial fibrillation. Still driving - not driving to [facility] anymore  No CP or dyspnea   Oct. 25, 2021 Nicol is seen today after a 2 year absence.  She was seen as a virtual visit last year. Hx of CAD, atrial fib Hx of anemia  She has developed some dementia She has been having some issues with leg  edema  intermittant shortness of breath      Current Outpatient Medications on File Prior to Visit  Medication Sig Dispense Refill   Ascorbic Acid (VITAMIN C) 500 MG CAPS Take 1 tablet by mouth daily. With rose hips     diltiazem (TIAZAC) 360 MG 24 hr capsule TAKE ONE CAPSULE BY MOUTH DAILY . 90 capsule 3   diphenhydramine-acetaminophen (TYLENOL PM) 25-500 MG TABS tablet Take 2 tablets by mouth at bedtime.     ELIQUIS 5 MG TABS tablet TAKE ONE TABLET BY MOUTH TWICE A DAY 60 tablet 3   rosuvastatin (CRESTOR) 10 MG tablet TAKE ONE TABLET BY MOUTH DAILY 90 tablet 3   TYMLOS 3120 MCG/1.56ML SOPN Inject as directed daily.     No current facility-administered medications on file prior to visit.    Allergies  Allergen Reactions   Amiodarone Hives and Other (See Comments)    ALL OVER BODY HIVES/ ITCH  other   Aspirin Other (See Comments)    Gi bleed  other   Atorvastatin Other (See Comments)    MUSCLE ACHES other   Lipitor [Atorvastatin Calcium] Other (See Comments)    MUSCLE ACHES   Nsaids Other (See Comments)     GI bleed   Tolmetin Other (See Comments)    GI bleed other   Naproxen Other (See Comments)   Niacin Rash and Other (See Comments)    other   Niaspan [Niacin Er] Rash    Past Medical History:  Diagnosis Date   Arthritis    osteo   in back   Coronary artery disease 2007   3 vessel CABG with LIMA-LAD, SVG to diag, and SVG - Ramus   Diverticulitis    GI bleeding 09/09/2013   Hearing loss of left ear 07/2013   History of atrial fibrillation; review of all ECG are most consistent with atrial tachycardia    had rash with amiodarone; no anticoagulation due to GI bleed in December 2012   History of GI bleed Dec 2012   History of hyperkalemia    Hyperlipidemia    Hypertension    Hyponatremia    Pacemaker-Medtronic 02/09/2012   Palpitations     Past Surgical History:  Procedure Laterality Date   ABDOMINAL HYSTERECTOMY     COLONOSCOPY N/A 09/12/2013   Procedure: COLONOSCOPY;  Surgeon: Wonda Horner, MD;  Location: [facility] ENDOSCOPY;  Service: Endoscopy;  Laterality: N/A;   CORONARY ARTERY BYPASS GRAFT  Feb 2007   Dr. Cyndia Bent;    ESOPHAGOGASTRODUODENOSCOPY  08/04/2011   Procedure: ESOPHAGOGASTRODUODENOSCOPY (EGD);  Surgeon: Cleotis Nipper, MD;  Location: [facility] ENDOSCOPY;  Service: Endoscopy;  Laterality: N/A;  do at bedside   ESOPHAGOGASTRODUODENOSCOPY N/A 09/11/2013   Procedure: ESOPHAGOGASTRODUODENOSCOPY (EGD);  Surgeon: Jeryl Columbia, MD;  Location[facility] ENDOSCOPY;  Service: Endoscopy;  Laterality: N/A;   IR KYPHO LUMBAR INC FX REDUCE BONE BX UNI/BIL CANNULATION INC/IMAGING  07/16/2018   IR KYPHO THORACIC WITH BONE BIOPSY  04/11/2017   IR PATIENT EVAL TECH 0-60 MINS  07/13/2018   IR RADIOLOGIST EVAL & MGMT  04/04/2017   MAZE     pace maker     PARTIAL HYSTERECTOMY     PERMANENT PACEMAKER INSERTION N/A 02/08/2012   Procedure: PERMANENT PACEMAKER INSERTION;  Surgeon: Deboraha Sprang, MD;  Location: [facility] CATH LAB;  Service: Cardiovascular;  Laterality: N/A;     Social History   Tobacco Use  Smoking Status Never Smoker  Smokeless Tobacco Never Used    Social History  Substance and Sexual Activity  Alcohol Use No    Family History  Problem Relation Age of Onset   Heart failure Father    Kidney failure Father    Bone cancer Brother    Kidney failure Sister    Colon cancer Sister    Cancer Sister     Reviw of Systems:  Reviewed in the HPI.  All other systems are negative.   Physical Exam: Blood pressure 114/60, pulse 85, height 5\' 4"  (1.626 m), weight 142 lb 6.4 oz (64.6 kg), SpO2 96 %.  GEN:  Well nourished, well developed in no acute distress HEENT: Normal NECK: No JVD; No carotid bruits LYMPHATICS: No lymphadenopathy CARDIAC: RRR , no murmurs, rubs, gallops RESPIRATORY:  Clear to auscultation without rales, wheezing or rhonchi  ABDOMEN: Soft, non-tender, non-distended MUSCULOSKELETAL:   1-2 + pitting edema in lower legs.  SKIN: Warm and dry NEUROLOGIC: alert, pleasant,  Mild dementia    ECG:    Assessment / Plan:   1. CAD - s/p CABG 2007,   - no angina   2. Atrial fib:  - RR  3.  Leg edema:   Multifactorial.   She did better with the torsemide compared to the lasix.   Will stop the Lasix and start torsemide 20 mg a day.  We will also add potassium chloride 10 mEq a day.  We will have her return in 2 weeks for nurse visit/blood pressure check/basic metabolic profile.  We will make a determination at that time whether to continue daily torsemide or cut it back to 3 times a week. She has a tendency to have hyponatremia.   4. GI Bleed  -     5. Anemia -     6.  Fatigue: We will stop her metoprolol.  Her rate seems to be fairly well controlled on the current dose of Cardizem.  7. Hyperlipidemia -     8. HTN:  .     Will see her back for a nurse visit , BP check, BMP in 2 weeks and I'll see her in 3 months .  Mertie Moores, MD  06/08/2020 12:19 PM    [address],  [place], [place]  [address] Pager [phone] Phone: [phone]; Fax: [phone]

## 2020-06-08 ENCOUNTER — Other Ambulatory Visit: Payer: Self-pay

## 2020-06-08 ENCOUNTER — Ambulatory Visit: Payer: Medicare HMO | Admitting: Cardiovascular Disease

## 2020-06-08 ENCOUNTER — Encounter: Payer: Self-pay | Admitting: Cardiovascular Disease

## 2020-06-08 ENCOUNTER — Other Ambulatory Visit: Payer: Medicare HMO

## 2020-06-08 VITALS — BP 114/60 | HR 85 | Ht 64.0 in | Wt 142.4 lb

## 2020-06-08 DIAGNOSIS — I482 Chronic atrial fibrillation, unspecified: Secondary | ICD-10-CM

## 2020-06-08 DIAGNOSIS — R600 Localized edema: Secondary | ICD-10-CM | POA: Diagnosis not present

## 2020-06-08 DIAGNOSIS — I2510 Atherosclerotic heart disease of native coronary artery without angina pectoris: Secondary | ICD-10-CM

## 2020-06-08 MED ORDER — POTASSIUM CHLORIDE ER 10 MEQ PO TBCR: 10.0000 meq | EXTENDED_RELEASE_TABLET | Freq: Every day | ORAL | 3 refills | Status: DC

## 2020-06-08 MED ORDER — TORSEMIDE 20 MG PO TABS: 20.0000 mg | ORAL_TABLET | Freq: Every day | ORAL | 3 refills | Status: DC

## 2020-06-08 NOTE — Patient Instructions (Addendum)
Medication Instructions:    For your  leg edema you  should do  the following 1. Leg elevation - I recommend the Lounge Dr. Leg rest.  See below for details  2. Salt restriction  -  Use potassium chloride instead of regular salt as a salt substitute. 3. Walk regularly 4. Compression hose - [facility] [employer] 5. Weight loss    Available on [email or web address] Or  Go to [email or web address]     Medication Instructions:  STOP METOPROLOL STOP FUROSEMIDE START TORSEMIDE  EVERY DAY STARTKDUR 10 MEQ 1 EVERY DAY  *If you need a refill on your cardiac medications before your next appointment, please call your pharmacy*   Lab Work: BMET IN 2 WEEKS If you have labs (blood work) drawn today and your tests are completely normal, you will receive your results only by: Marland Kitchen MyChart Message (if you have MyChart) OR . A paper copy in the mail If you have any lab test that is abnormal or we need to change your treatment, we will call you to review the results.   Testing/Procedures: NONE   Follow-Up: At [facility], you and your health needs are our priority.  As part of our continuing mission to provide you with exceptional heart care, we have created designated Provider Care Teams.  These Care Teams include your primary Cardiologist (physician) and Advanced Practice Providers (APPs -  Physician Assistants and Nurse Practitioners) who all work together to provide you with the care you need, when you need it.  We recommend signing up for the patient portal called "MyChart".  Sign up information is provided on this After Visit Summary.  MyChart is used to connect with patients for Virtual Visits (Telemedicine).  Patients are able to view lab/test results, encounter notes, upcoming appointments, etc.  Non-urgent messages can be sent to your provider as well.   To learn more about what you can do with MyChart, go to [email or web address].    Your next appointment:   2 week(s)  The format for your  next appointment:   In Person  Provider:   NURSE VISIT  B/P CHECK    Other Instructions 3 MONTHS WITH DR Acie Fredrickson

## 2020-06-09 DIAGNOSIS — Z20828 Contact with and (suspected) exposure to other viral communicable diseases: Secondary | ICD-10-CM | POA: Diagnosis not present

## 2020-06-09 DIAGNOSIS — Z1159 Encounter for screening for other viral diseases: Secondary | ICD-10-CM | POA: Diagnosis not present

## 2020-06-12 DIAGNOSIS — Z1159 Encounter for screening for other viral diseases: Secondary | ICD-10-CM | POA: Diagnosis not present

## 2020-06-12 DIAGNOSIS — Z20828 Contact with and (suspected) exposure to other viral communicable diseases: Secondary | ICD-10-CM | POA: Diagnosis not present

## 2020-06-16 DIAGNOSIS — Z1159 Encounter for screening for other viral diseases: Secondary | ICD-10-CM | POA: Diagnosis not present

## 2020-06-16 DIAGNOSIS — Z20828 Contact with and (suspected) exposure to other viral communicable diseases: Secondary | ICD-10-CM | POA: Diagnosis not present

## 2020-06-18 LAB — CUP PACEART REMOTE DEVICE CHECK
Battery Voltage: 2.92 V
Brady Statistic AP VP Percent: 0.14 %
Brady Statistic AP VS Percent: 61.11 %
Brady Statistic AS VP Percent: 0.5 %
Brady Statistic AS VS Percent: 38.25 %
Brady Statistic RA Percent Paced: 60.48 %
Brady Statistic RV Percent Paced: 0.6 %
Date Time Interrogation Session: 20211104115653
Implantable Lead Implant Date: 20130626
Implantable Lead Implant Date: 20130626
Implantable Lead Location: 753859
Implantable Lead Location: 753860
Implantable Pulse Generator Implant Date: 20130626
Lead Channel Impedance Value: 368 Ohm
Lead Channel Impedance Value: 376 Ohm
Lead Channel Sensing Intrinsic Amplitude: 0.763 mV
Lead Channel Sensing Intrinsic Amplitude: 7.978 mV
Lead Channel Setting Pacing Amplitude: 2 V
Lead Channel Setting Pacing Amplitude: 2.5 V
Lead Channel Setting Pacing Pulse Width: 0.4 ms
Lead Channel Setting Sensing Sensitivity: 0.9 mV

## 2020-06-19 ENCOUNTER — Ambulatory Visit (INDEPENDENT_AMBULATORY_CARE_PROVIDER_SITE_OTHER): Payer: Medicare HMO

## 2020-06-19 DIAGNOSIS — I495 Sick sinus syndrome: Secondary | ICD-10-CM | POA: Diagnosis not present

## 2020-06-19 NOTE — Progress Notes (Signed)
Remote pacemaker transmission.   

## 2020-06-22 ENCOUNTER — Ambulatory Visit (INDEPENDENT_AMBULATORY_CARE_PROVIDER_SITE_OTHER): Payer: Medicare HMO | Admitting: *Deleted

## 2020-06-22 ENCOUNTER — Other Ambulatory Visit: Payer: Medicare HMO | Admitting: *Deleted

## 2020-06-22 ENCOUNTER — Other Ambulatory Visit: Payer: Self-pay

## 2020-06-22 VITALS — BP 128/78 | HR 88 | Ht 64.0 in | Wt 139.8 lb

## 2020-06-22 DIAGNOSIS — R600 Localized edema: Secondary | ICD-10-CM

## 2020-06-22 LAB — BASIC METABOLIC PANEL
BUN/Creatinine Ratio: 26 (ref 12–28)
BUN: 31 mg/dL (ref 10–36)
CO2: 22 mmol/L (ref 20–29)
Calcium: 9.9 mg/dL (ref 8.7–10.3)
Chloride: 101 mmol/L (ref 96–106)
Creatinine, Ser: 1.21 mg/dL — ABNORMAL HIGH (ref 0.57–1.00)
GFR calc Af Amer: 45 mL/min/{1.73_m2} — ABNORMAL LOW (ref >59)
GFR calc non Af Amer: 39 mL/min/{1.73_m2} — ABNORMAL LOW (ref >59)
Glucose: 91 mg/dL (ref 65–99)
Potassium: 4.5 mmol/L (ref 3.5–5.2)
Sodium: 139 mmol/L (ref 134–144)

## 2020-06-22 NOTE — Progress Notes (Signed)
Pt here for labs and  B/P check see vital signs  Per pt cy feels fine some swelling still noted to bil lower legs Per pt this improved since med changes . Pt aware will call once receive lab results from today's visit / cy

## 2020-06-26 DIAGNOSIS — Z20828 Contact with and (suspected) exposure to other viral communicable diseases: Secondary | ICD-10-CM | POA: Diagnosis not present

## 2020-06-26 DIAGNOSIS — Z1159 Encounter for screening for other viral diseases: Secondary | ICD-10-CM | POA: Diagnosis not present

## 2020-06-30 DIAGNOSIS — Z1159 Encounter for screening for other viral diseases: Secondary | ICD-10-CM | POA: Diagnosis not present

## 2020-06-30 DIAGNOSIS — Z20828 Contact with and (suspected) exposure to other viral communicable diseases: Secondary | ICD-10-CM | POA: Diagnosis not present

## 2020-07-07 ENCOUNTER — Telehealth: Payer: Self-pay | Admitting: Cardiovascular Disease

## 2020-07-07 DIAGNOSIS — Z20828 Contact with and (suspected) exposure to other viral communicable diseases: Secondary | ICD-10-CM | POA: Diagnosis not present

## 2020-07-07 DIAGNOSIS — Z1159 Encounter for screening for other viral diseases: Secondary | ICD-10-CM | POA: Diagnosis not present

## 2020-07-07 NOTE — Telephone Encounter (Signed)
Patient's daughter in law calling for lab results. She would also like to know if the patient needs to stay on her fluid pill or not.

## 2020-07-07 NOTE — Telephone Encounter (Signed)
Per last result note labs are stable and patient appears to be tolerating torsemide well.  I spoke with Amy and gave her this information.  Patient will continue torsemide

## 2020-07-14 DIAGNOSIS — Z20828 Contact with and (suspected) exposure to other viral communicable diseases: Secondary | ICD-10-CM | POA: Diagnosis not present

## 2020-07-14 DIAGNOSIS — Z1159 Encounter for screening for other viral diseases: Secondary | ICD-10-CM | POA: Diagnosis not present

## 2020-07-17 DIAGNOSIS — Z20828 Contact with and (suspected) exposure to other viral communicable diseases: Secondary | ICD-10-CM | POA: Diagnosis not present

## 2020-07-17 DIAGNOSIS — Z1159 Encounter for screening for other viral diseases: Secondary | ICD-10-CM | POA: Diagnosis not present

## 2020-07-20 DIAGNOSIS — R41841 Cognitive communication deficit: Secondary | ICD-10-CM | POA: Diagnosis not present

## 2020-07-20 DIAGNOSIS — R488 Other symbolic dysfunctions: Secondary | ICD-10-CM | POA: Diagnosis not present

## 2020-07-21 DIAGNOSIS — Z20828 Contact with and (suspected) exposure to other viral communicable diseases: Secondary | ICD-10-CM | POA: Diagnosis not present

## 2020-07-21 DIAGNOSIS — Z1159 Encounter for screening for other viral diseases: Secondary | ICD-10-CM | POA: Diagnosis not present

## 2020-07-23 DIAGNOSIS — R41841 Cognitive communication deficit: Secondary | ICD-10-CM | POA: Diagnosis not present

## 2020-07-23 DIAGNOSIS — R488 Other symbolic dysfunctions: Secondary | ICD-10-CM | POA: Diagnosis not present

## 2020-07-23 DIAGNOSIS — M6281 Muscle weakness (generalized): Secondary | ICD-10-CM | POA: Diagnosis not present

## 2020-07-24 DIAGNOSIS — Z20828 Contact with and (suspected) exposure to other viral communicable diseases: Secondary | ICD-10-CM | POA: Diagnosis not present

## 2020-07-24 DIAGNOSIS — R488 Other symbolic dysfunctions: Secondary | ICD-10-CM | POA: Diagnosis not present

## 2020-07-24 DIAGNOSIS — R41841 Cognitive communication deficit: Secondary | ICD-10-CM | POA: Diagnosis not present

## 2020-07-24 DIAGNOSIS — Z1159 Encounter for screening for other viral diseases: Secondary | ICD-10-CM | POA: Diagnosis not present

## 2020-07-27 DIAGNOSIS — R488 Other symbolic dysfunctions: Secondary | ICD-10-CM | POA: Diagnosis not present

## 2020-07-27 DIAGNOSIS — M6281 Muscle weakness (generalized): Secondary | ICD-10-CM | POA: Diagnosis not present

## 2020-07-27 DIAGNOSIS — R41841 Cognitive communication deficit: Secondary | ICD-10-CM | POA: Diagnosis not present

## 2020-07-28 DIAGNOSIS — M6281 Muscle weakness (generalized): Secondary | ICD-10-CM | POA: Diagnosis not present

## 2020-07-28 DIAGNOSIS — R488 Other symbolic dysfunctions: Secondary | ICD-10-CM | POA: Diagnosis not present

## 2020-07-28 DIAGNOSIS — Z20828 Contact with and (suspected) exposure to other viral communicable diseases: Secondary | ICD-10-CM | POA: Diagnosis not present

## 2020-07-28 DIAGNOSIS — Z1159 Encounter for screening for other viral diseases: Secondary | ICD-10-CM | POA: Diagnosis not present

## 2020-07-29 DIAGNOSIS — R488 Other symbolic dysfunctions: Secondary | ICD-10-CM | POA: Diagnosis not present

## 2020-07-29 DIAGNOSIS — R41841 Cognitive communication deficit: Secondary | ICD-10-CM | POA: Diagnosis not present

## 2020-07-29 DIAGNOSIS — M810 Age-related osteoporosis without current pathological fracture: Secondary | ICD-10-CM | POA: Diagnosis not present

## 2020-07-30 DIAGNOSIS — R488 Other symbolic dysfunctions: Secondary | ICD-10-CM | POA: Diagnosis not present

## 2020-07-30 DIAGNOSIS — R41841 Cognitive communication deficit: Secondary | ICD-10-CM | POA: Diagnosis not present

## 2020-07-30 DIAGNOSIS — M6281 Muscle weakness (generalized): Secondary | ICD-10-CM | POA: Diagnosis not present

## 2020-07-31 DIAGNOSIS — Z1159 Encounter for screening for other viral diseases: Secondary | ICD-10-CM | POA: Diagnosis not present

## 2020-07-31 DIAGNOSIS — Z20828 Contact with and (suspected) exposure to other viral communicable diseases: Secondary | ICD-10-CM | POA: Diagnosis not present

## 2020-08-01 DIAGNOSIS — K5900 Constipation, unspecified: Secondary | ICD-10-CM | POA: Diagnosis not present

## 2020-08-01 DIAGNOSIS — N3001 Acute cystitis with hematuria: Secondary | ICD-10-CM | POA: Diagnosis not present

## 2020-08-01 DIAGNOSIS — K649 Unspecified hemorrhoids: Secondary | ICD-10-CM | POA: Diagnosis not present

## 2020-08-03 DIAGNOSIS — R488 Other symbolic dysfunctions: Secondary | ICD-10-CM | POA: Diagnosis not present

## 2020-08-03 DIAGNOSIS — R69 Illness, unspecified: Secondary | ICD-10-CM | POA: Diagnosis not present

## 2020-08-03 DIAGNOSIS — M6281 Muscle weakness (generalized): Secondary | ICD-10-CM | POA: Diagnosis not present

## 2020-08-04 DIAGNOSIS — R488 Other symbolic dysfunctions: Secondary | ICD-10-CM | POA: Diagnosis not present

## 2020-08-04 DIAGNOSIS — Z1159 Encounter for screening for other viral diseases: Secondary | ICD-10-CM | POA: Diagnosis not present

## 2020-08-04 DIAGNOSIS — R41841 Cognitive communication deficit: Secondary | ICD-10-CM | POA: Diagnosis not present

## 2020-08-04 DIAGNOSIS — Z20828 Contact with and (suspected) exposure to other viral communicable diseases: Secondary | ICD-10-CM | POA: Diagnosis not present

## 2020-08-05 DIAGNOSIS — R41841 Cognitive communication deficit: Secondary | ICD-10-CM | POA: Diagnosis not present

## 2020-08-05 DIAGNOSIS — R488 Other symbolic dysfunctions: Secondary | ICD-10-CM | POA: Diagnosis not present

## 2020-08-05 DIAGNOSIS — M6281 Muscle weakness (generalized): Secondary | ICD-10-CM | POA: Diagnosis not present

## 2020-08-06 DIAGNOSIS — R41841 Cognitive communication deficit: Secondary | ICD-10-CM | POA: Diagnosis not present

## 2020-08-06 DIAGNOSIS — M6281 Muscle weakness (generalized): Secondary | ICD-10-CM | POA: Diagnosis not present

## 2020-08-06 DIAGNOSIS — R488 Other symbolic dysfunctions: Secondary | ICD-10-CM | POA: Diagnosis not present

## 2020-08-10 DIAGNOSIS — R41841 Cognitive communication deficit: Secondary | ICD-10-CM | POA: Diagnosis not present

## 2020-08-10 DIAGNOSIS — R488 Other symbolic dysfunctions: Secondary | ICD-10-CM | POA: Diagnosis not present

## 2020-08-11 DIAGNOSIS — Z20828 Contact with and (suspected) exposure to other viral communicable diseases: Secondary | ICD-10-CM | POA: Diagnosis not present

## 2020-08-11 DIAGNOSIS — Z1159 Encounter for screening for other viral diseases: Secondary | ICD-10-CM | POA: Diagnosis not present

## 2020-08-11 DIAGNOSIS — M6281 Muscle weakness (generalized): Secondary | ICD-10-CM | POA: Diagnosis not present

## 2020-08-11 DIAGNOSIS — R488 Other symbolic dysfunctions: Secondary | ICD-10-CM | POA: Diagnosis not present

## 2020-08-12 DIAGNOSIS — R41841 Cognitive communication deficit: Secondary | ICD-10-CM | POA: Diagnosis not present

## 2020-08-12 DIAGNOSIS — R488 Other symbolic dysfunctions: Secondary | ICD-10-CM | POA: Diagnosis not present

## 2020-08-13 DIAGNOSIS — R41841 Cognitive communication deficit: Secondary | ICD-10-CM | POA: Diagnosis not present

## 2020-08-13 DIAGNOSIS — R488 Other symbolic dysfunctions: Secondary | ICD-10-CM | POA: Diagnosis not present

## 2020-08-14 DIAGNOSIS — Z1159 Encounter for screening for other viral diseases: Secondary | ICD-10-CM | POA: Diagnosis not present

## 2020-08-14 DIAGNOSIS — Z20828 Contact with and (suspected) exposure to other viral communicable diseases: Secondary | ICD-10-CM | POA: Diagnosis not present

## 2020-08-18 DIAGNOSIS — Z1159 Encounter for screening for other viral diseases: Secondary | ICD-10-CM | POA: Diagnosis not present

## 2020-08-18 DIAGNOSIS — R41841 Cognitive communication deficit: Secondary | ICD-10-CM | POA: Diagnosis not present

## 2020-08-18 DIAGNOSIS — Z20828 Contact with and (suspected) exposure to other viral communicable diseases: Secondary | ICD-10-CM | POA: Diagnosis not present

## 2020-08-18 DIAGNOSIS — R488 Other symbolic dysfunctions: Secondary | ICD-10-CM | POA: Diagnosis not present

## 2020-08-19 DIAGNOSIS — M6281 Muscle weakness (generalized): Secondary | ICD-10-CM | POA: Diagnosis not present

## 2020-08-19 DIAGNOSIS — R488 Other symbolic dysfunctions: Secondary | ICD-10-CM | POA: Diagnosis not present

## 2020-08-20 DIAGNOSIS — R488 Other symbolic dysfunctions: Secondary | ICD-10-CM | POA: Diagnosis not present

## 2020-08-20 DIAGNOSIS — R41841 Cognitive communication deficit: Secondary | ICD-10-CM | POA: Diagnosis not present

## 2020-08-20 DIAGNOSIS — M6281 Muscle weakness (generalized): Secondary | ICD-10-CM | POA: Diagnosis not present

## 2020-08-21 DIAGNOSIS — Z1159 Encounter for screening for other viral diseases: Secondary | ICD-10-CM | POA: Diagnosis not present

## 2020-08-21 DIAGNOSIS — R41841 Cognitive communication deficit: Secondary | ICD-10-CM | POA: Diagnosis not present

## 2020-08-21 DIAGNOSIS — M6281 Muscle weakness (generalized): Secondary | ICD-10-CM | POA: Diagnosis not present

## 2020-08-21 DIAGNOSIS — Z20828 Contact with and (suspected) exposure to other viral communicable diseases: Secondary | ICD-10-CM | POA: Diagnosis not present

## 2020-08-21 DIAGNOSIS — R488 Other symbolic dysfunctions: Secondary | ICD-10-CM | POA: Diagnosis not present

## 2020-08-24 DIAGNOSIS — M6281 Muscle weakness (generalized): Secondary | ICD-10-CM | POA: Diagnosis not present

## 2020-08-24 DIAGNOSIS — R488 Other symbolic dysfunctions: Secondary | ICD-10-CM | POA: Diagnosis not present

## 2020-08-25 DIAGNOSIS — M6281 Muscle weakness (generalized): Secondary | ICD-10-CM | POA: Diagnosis not present

## 2020-08-25 DIAGNOSIS — R488 Other symbolic dysfunctions: Secondary | ICD-10-CM | POA: Diagnosis not present

## 2020-08-25 DIAGNOSIS — R41841 Cognitive communication deficit: Secondary | ICD-10-CM | POA: Diagnosis not present

## 2020-08-26 DIAGNOSIS — M6281 Muscle weakness (generalized): Secondary | ICD-10-CM | POA: Diagnosis not present

## 2020-08-26 DIAGNOSIS — R488 Other symbolic dysfunctions: Secondary | ICD-10-CM | POA: Diagnosis not present

## 2020-08-27 DIAGNOSIS — R488 Other symbolic dysfunctions: Secondary | ICD-10-CM | POA: Diagnosis not present

## 2020-08-27 DIAGNOSIS — Z20828 Contact with and (suspected) exposure to other viral communicable diseases: Secondary | ICD-10-CM | POA: Diagnosis not present

## 2020-08-27 DIAGNOSIS — R41841 Cognitive communication deficit: Secondary | ICD-10-CM | POA: Diagnosis not present

## 2020-08-28 DIAGNOSIS — R488 Other symbolic dysfunctions: Secondary | ICD-10-CM | POA: Diagnosis not present

## 2020-08-28 DIAGNOSIS — R41841 Cognitive communication deficit: Secondary | ICD-10-CM | POA: Diagnosis not present

## 2020-08-31 ENCOUNTER — Encounter: Payer: Medicare HMO | Admitting: Internal Medicine

## 2020-09-01 DIAGNOSIS — R488 Other symbolic dysfunctions: Secondary | ICD-10-CM | POA: Diagnosis not present

## 2020-09-01 DIAGNOSIS — Z20828 Contact with and (suspected) exposure to other viral communicable diseases: Secondary | ICD-10-CM | POA: Diagnosis not present

## 2020-09-01 DIAGNOSIS — R41841 Cognitive communication deficit: Secondary | ICD-10-CM | POA: Diagnosis not present

## 2020-09-02 DIAGNOSIS — R488 Other symbolic dysfunctions: Secondary | ICD-10-CM | POA: Diagnosis not present

## 2020-09-02 DIAGNOSIS — M6281 Muscle weakness (generalized): Secondary | ICD-10-CM | POA: Diagnosis not present

## 2020-09-03 DIAGNOSIS — R488 Other symbolic dysfunctions: Secondary | ICD-10-CM | POA: Diagnosis not present

## 2020-09-03 DIAGNOSIS — Z20828 Contact with and (suspected) exposure to other viral communicable diseases: Secondary | ICD-10-CM | POA: Diagnosis not present

## 2020-09-03 DIAGNOSIS — R41841 Cognitive communication deficit: Secondary | ICD-10-CM | POA: Diagnosis not present

## 2020-09-04 DIAGNOSIS — R41841 Cognitive communication deficit: Secondary | ICD-10-CM | POA: Diagnosis not present

## 2020-09-04 DIAGNOSIS — R488 Other symbolic dysfunctions: Secondary | ICD-10-CM | POA: Diagnosis not present

## 2020-09-04 DIAGNOSIS — M6281 Muscle weakness (generalized): Secondary | ICD-10-CM | POA: Diagnosis not present

## 2020-09-07 ENCOUNTER — Ambulatory Visit: Payer: Medicare HMO | Admitting: Cardiovascular Disease

## 2020-09-07 DIAGNOSIS — R41841 Cognitive communication deficit: Secondary | ICD-10-CM | POA: Diagnosis not present

## 2020-09-07 DIAGNOSIS — Z20828 Contact with and (suspected) exposure to other viral communicable diseases: Secondary | ICD-10-CM | POA: Diagnosis not present

## 2020-09-07 DIAGNOSIS — R488 Other symbolic dysfunctions: Secondary | ICD-10-CM | POA: Diagnosis not present

## 2020-09-08 DIAGNOSIS — R488 Other symbolic dysfunctions: Secondary | ICD-10-CM | POA: Diagnosis not present

## 2020-09-08 DIAGNOSIS — M6281 Muscle weakness (generalized): Secondary | ICD-10-CM | POA: Diagnosis not present

## 2020-09-10 DIAGNOSIS — Z20828 Contact with and (suspected) exposure to other viral communicable diseases: Secondary | ICD-10-CM | POA: Diagnosis not present

## 2020-09-10 DIAGNOSIS — R488 Other symbolic dysfunctions: Secondary | ICD-10-CM | POA: Diagnosis not present

## 2020-09-10 DIAGNOSIS — R41841 Cognitive communication deficit: Secondary | ICD-10-CM | POA: Diagnosis not present

## 2020-09-14 DIAGNOSIS — Z20828 Contact with and (suspected) exposure to other viral communicable diseases: Secondary | ICD-10-CM | POA: Diagnosis not present

## 2020-09-17 DIAGNOSIS — Z20828 Contact with and (suspected) exposure to other viral communicable diseases: Secondary | ICD-10-CM | POA: Diagnosis not present

## 2020-09-21 DIAGNOSIS — Z20828 Contact with and (suspected) exposure to other viral communicable diseases: Secondary | ICD-10-CM | POA: Diagnosis not present

## 2020-09-24 DIAGNOSIS — Z20828 Contact with and (suspected) exposure to other viral communicable diseases: Secondary | ICD-10-CM | POA: Diagnosis not present

## 2020-09-25 ENCOUNTER — Ambulatory Visit (INDEPENDENT_AMBULATORY_CARE_PROVIDER_SITE_OTHER): Payer: Medicare HMO

## 2020-09-25 ENCOUNTER — Other Ambulatory Visit: Payer: Self-pay | Admitting: Cardiovascular Disease

## 2020-09-25 DIAGNOSIS — I495 Sick sinus syndrome: Secondary | ICD-10-CM | POA: Diagnosis not present

## 2020-09-25 DIAGNOSIS — I480 Paroxysmal atrial fibrillation: Secondary | ICD-10-CM

## 2020-09-25 NOTE — Telephone Encounter (Signed)
Pt last saw Dr Acie Fredrickson 06/08/20, last labs 06/22/20 Creat 1.21, age 87, weight 63.4kg, based on specified criteria pt is on appropriate dosage of Eliquis 5mg  BID for afib.  Will refill rx.

## 2020-09-27 LAB — CUP PACEART REMOTE DEVICE CHECK
Battery Voltage: 2.9 V
Brady Statistic AP VP Percent: 0.34 %
Brady Statistic AP VS Percent: 12.8 %
Brady Statistic AS VP Percent: 0.51 %
Brady Statistic AS VS Percent: 86.35 %
Brady Statistic RA Percent Paced: 13.09 %
Brady Statistic RV Percent Paced: 0.82 %
Date Time Interrogation Session: 20220211152446
Implantable Lead Implant Date: 20130626
Implantable Lead Implant Date: 20130626
Implantable Lead Location: 753859
Implantable Lead Location: 753860
Implantable Pulse Generator Implant Date: 20130626
Lead Channel Impedance Value: 368 Ohm
Lead Channel Impedance Value: 368 Ohm
Lead Channel Sensing Intrinsic Amplitude: 0.636 mV
Lead Channel Sensing Intrinsic Amplitude: 4.856 mV
Lead Channel Setting Pacing Amplitude: 2 V
Lead Channel Setting Pacing Amplitude: 2.5 V
Lead Channel Setting Pacing Pulse Width: 0.4 ms
Lead Channel Setting Sensing Sensitivity: 0.9 mV

## 2020-09-28 DIAGNOSIS — Z20828 Contact with and (suspected) exposure to other viral communicable diseases: Secondary | ICD-10-CM | POA: Diagnosis not present

## 2020-09-30 NOTE — Progress Notes (Signed)
Remote pacemaker transmission.   

## 2020-10-05 DIAGNOSIS — Z20828 Contact with and (suspected) exposure to other viral communicable diseases: Secondary | ICD-10-CM | POA: Diagnosis not present

## 2020-10-08 DIAGNOSIS — Z20828 Contact with and (suspected) exposure to other viral communicable diseases: Secondary | ICD-10-CM | POA: Diagnosis not present

## 2020-10-12 DIAGNOSIS — Z20828 Contact with and (suspected) exposure to other viral communicable diseases: Secondary | ICD-10-CM | POA: Diagnosis not present

## 2020-10-19 DIAGNOSIS — Z20822 Contact with and (suspected) exposure to covid-19: Secondary | ICD-10-CM | POA: Diagnosis not present

## 2020-10-22 ENCOUNTER — Other Ambulatory Visit: Payer: Self-pay | Admitting: Cardiovascular Disease

## 2020-10-22 DIAGNOSIS — I480 Paroxysmal atrial fibrillation: Secondary | ICD-10-CM

## 2020-10-22 NOTE — Telephone Encounter (Signed)
Eliquis 5mg  refill request received. Patient is 87 years old, weight-63.4kg, Crea-1.21 on 06/22/2020, Diagnosis-Afib, and last seen by Dr. Acie Fredrickson on 06/08/2020. Dose is appropriate based on dosing criteria. Will send in refill to requested pharmacy.  Refill was sent on 09/25/2020 and called the pharmacy to confirm and he confirmed it was filled and the pt has 4 more refills. Will deny this one as the Eliquis refill is there.

## 2020-10-26 DIAGNOSIS — Z20828 Contact with and (suspected) exposure to other viral communicable diseases: Secondary | ICD-10-CM | POA: Diagnosis not present

## 2020-10-27 DIAGNOSIS — D1801 Hemangioma of skin and subcutaneous tissue: Secondary | ICD-10-CM | POA: Diagnosis not present

## 2020-10-27 DIAGNOSIS — L814 Other melanin hyperpigmentation: Secondary | ICD-10-CM | POA: Diagnosis not present

## 2020-10-27 DIAGNOSIS — L821 Other seborrheic keratosis: Secondary | ICD-10-CM | POA: Diagnosis not present

## 2020-10-27 DIAGNOSIS — L570 Actinic keratosis: Secondary | ICD-10-CM | POA: Diagnosis not present

## 2020-10-28 ENCOUNTER — Encounter: Payer: Self-pay | Admitting: Cardiovascular Disease

## 2020-10-28 NOTE — Progress Notes (Signed)
Donna Ortega Date of Birth  1926/06/18 [facility]      [address]    [address]    [place], [place]  [address]      Problems:  1. CAD - s/p CABG 2007, 2. PACs 3. Drug Rash - amiodarone 4. GI Bleed 5. Anemia 6. hyponatremia  Previous notes:   Donna Ortega is an 87 year old female with a history of coronary artery disease and atrial fibrillation. She was admitted to the hospital in December with a GI bleed, anemia, and rapid atrial fibrillation. She was discharged with the additional medications of amiodarone and Cardizem CD.  She was seen in mid January and was doing fairly well.  She stopped her Amiodarone due to drug rash.  She still has generalized fatigue and generalized weakness. She wonders if it may be due to the Lasix and potassium.  April 17, 2012 She has been more fatigued recently.  She played golf this past weekend and still is not over the exertion.  Dec. 3, 2013 :  She is doing well.  Dr. Caryl Comes stopped her Pradaxa due to GI bleed.    She is doing very well.   January 15, 2013:  Donna Ortega is doing OK.  She does not have as much energy as she would like.   She has continued to have problems with hyponatremia.  She is on maxzide which is likely exacerbating this.  No CP, no dyspnea.    Dec. 1, 2014:  Donna Ortega is doing well.  No CP or dyspnea.  Rhythm is stable.   Her Hb was a bit low when last checked by her medical doctor.  Has not started on the iron tabs.   No CP, not playing golf any more because of back pain ( s/p kyphoplasty)  .  She is still walking around the block on occasion.   She is now in her own apartment.   Sept. 9, 2015:  Donna Ortega is doing well.  She is not having any problems   March 02, 2015: No CP , no dyspnea.   Feb. 13, 2017:  Fell 3 weeks ago .  Now is using a cane for stability  She has occasional episodes of atrial fib - last 5 minutes or so.   Is on Eliquis 2.5 BID  Has had some bladder issues   Oct. 6, 2017:  Donna Ortega is seen back for  her CAD and PAF  Has some leg swelling .  Was on Maxzide - this is off her list - perhaps because of hypotension . Lisinopril was stopped at that time    November 29, 2016:  Doing well, no angina  Knows that she is starting to forget things on occasion Has been having some swelling in her feet and ankles.  BP has been a bit higher She avoids salty foods.   Nov. 21, 2018:  Doing well.  No CP or dyspnea Had kyphoplasty this past summer.  Very active.    Still driving    Jan 02, 8314:  Donna Ortega is seen today for follow-up of her coronary artery disease and hx atrial fibrillation. Still driving - not driving to [facility] anymore  No CP or dyspnea   Oct. 25, 2021 Donna Ortega is seen today after a 2 year absence.  She was seen as a virtual visit last year. Hx of CAD, atrial fib Hx of anemia  She has developed some dementia She has been having some issues with leg  edema  intermittant shortness of breath   October 30, 2020: Donna Ortega is seen for follow up .   Seen with her son  [name]) today  Hx of CAD, atrial fib, anemia,  Moderate dementia  Leg edema Is walking regularly  Lives at [facility]   Current Outpatient Medications on File Prior to Visit  Medication Sig Dispense Refill  . Ascorbic Acid (VITAMIN C) 500 MG CAPS Take 1 tablet by mouth daily. With rose hips    . diltiazem (TIAZAC) 360 MG 24 hr capsule TAKE ONE CAPSULE BY MOUTH DAILY . 90 capsule 3  . diphenhydramine-acetaminophen (TYLENOL PM) 25-500 MG TABS tablet Take 2 tablets by mouth at bedtime.    Marland Kitchen ELIQUIS 5 MG TABS tablet TAKE ONE TABLET BY MOUTH TWICE A DAY 60 tablet 5  . rosuvastatin (CRESTOR) 10 MG tablet TAKE ONE TABLET BY MOUTH DAILY 90 tablet 3  . TYMLOS 3120 MCG/1.56ML SOPN Inject as directed daily.    . potassium chloride (KLOR-CON) 10 MEQ tablet Take 1 tablet (10 mEq total) by mouth daily. 90 tablet 3  . torsemide (DEMADEX) 20 MG tablet Take 1 tablet (20 mg total) by mouth daily. 90 tablet 3   No current  facility-administered medications on file prior to visit.    Allergies  Allergen Reactions  . Amiodarone Hives and Other (See Comments)    ALL OVER BODY HIVES/ ITCH  other  . Aspirin Other (See Comments)    Gi bleed  other  . Atorvastatin Other (See Comments)    MUSCLE ACHES other  . Lipitor [Atorvastatin Calcium] Other (See Comments)    MUSCLE ACHES  . Nsaids Other (See Comments)    GI bleed  . Tolmetin Other (See Comments)    GI bleed other  . Naproxen Other (See Comments)  . Niacin Rash and Other (See Comments)    other  . Niaspan [Niacin Er] Rash    Past Medical History:  Diagnosis Date  . Arthritis    osteo   in back  . Coronary artery disease 2007   3 vessel CABG with LIMA-LAD, SVG to diag, and SVG - Ramus  . Diverticulitis   . GI bleeding 09/09/2013  . Hearing loss of left ear 07/2013  . History of atrial fibrillation; review of all ECG are most consistent with atrial tachycardia    had rash with amiodarone; no anticoagulation due to GI bleed in December 2012  . History of GI bleed Dec 2012  . History of hyperkalemia   . Hyperlipidemia   . Hypertension   . Hyponatremia   . Pacemaker-Medtronic 02/09/2012  . Palpitations     Past Surgical History:  Procedure Laterality Date  . ABDOMINAL HYSTERECTOMY    . COLONOSCOPY N/A 09/12/2013   Procedure: COLONOSCOPY;  Surgeon: Wonda Horner, MD;  Location: [facility] ENDOSCOPY;  Service: Endoscopy;  Laterality: N/A;  . CORONARY ARTERY BYPASS GRAFT  Feb 2007   Dr. Cyndia Bent;   . ESOPHAGOGASTRODUODENOSCOPY  08/04/2011   Procedure: ESOPHAGOGASTRODUODENOSCOPY (EGD);  Surgeon: Cleotis Nipper, MD;  Location: [facility] ENDOSCOPY;  Service: Endoscopy;  Laterality: N/A;  do at bedside  . ESOPHAGOGASTRODUODENOSCOPY N/A 09/11/2013   Procedure: ESOPHAGOGASTRODUODENOSCOPY (EGD);  Surgeon: Jeryl Columbia, MD;  Location: [facility] ENDOSCOPY;  Service: Endoscopy;  Laterality: N/A;  . IR KYPHO LUMBAR INC FX REDUCE BONE BX UNI/BIL CANNULATION INC/IMAGING   07/16/2018  . IR KYPHO THORACIC WITH BONE BIOPSY  04/11/2017  . IR PATIENT EVAL TECH 0-60 MINS  07/13/2018  . IR  RADIOLOGIST EVAL & MGMT  04/04/2017  . MAZE    . pace maker    . PARTIAL HYSTERECTOMY    . PERMANENT PACEMAKER INSERTION N/A 02/08/2012   Procedure: PERMANENT PACEMAKER INSERTION;  Surgeon: Deboraha Sprang, MD;  Location: [facility] CATH LAB;  Service: Cardiovascular;  Laterality: N/A;    Social History   Tobacco Use  Smoking Status Never Smoker  Smokeless Tobacco Never Used    Social History   Substance and Sexual Activity  Alcohol Use No    Family History  Problem Relation Age of Onset  . Heart failure Father   . Kidney failure Father   . Bone cancer Brother   . Kidney failure Sister   . Colon cancer Sister   . Cancer Sister     Reviw of Systems:  Reviewed in the HPI.  All other systems are negative.   Physical Exam: Blood pressure (!) 122/58, pulse 72, height 5\' 4"  (1.626 m), weight 138 lb (62.6 kg), SpO2 98 %.  GEN:  Well nourished, well developed in no acute distress HEENT: Normal NECK: No JVD; No carotid bruits LYMPHATICS: No lymphadenopathy CARDIAC: RRR   RESPIRATORY:  Clear to auscultation without rales, wheezing or rhonchi  ABDOMEN: Soft, non-tender, non-distended MUSCULOSKELETAL:  No edema; No deformity  SKIN: Warm and dry NEUROLOGIC:  Alert and oriented x 3  ECG:    Assessment / Plan:   1. CAD - s/p CABG 2007,      No angina .  Cont meds.   2. Atrial fib:  - RR  Cont current meds  3.  Leg edema:   Edema has improved Labs returned with creatinine of 1.6. We will reduce her torsemid and kdur to 4 times a weei ( M,W,F, S)    4. GI Bleed  -     5. Anemia -   Stable   6.  Fatigue:    7. Hyperlipidemia -  Check lipid profile   8. HTN:  .    9.  Vit D deficiency :  She is on tymlos.  The recommendation is to supplement Vit D in those who still have a low Vit D level   Will draw labs today     Mertie Moores, MD  10/30/2020 9:55 AM     [facility] [address],  [place], [place]  [address] Pager [phone] Phone: [phone]; Fax: [phone]

## 2020-10-30 ENCOUNTER — Ambulatory Visit: Payer: Medicare HMO | Admitting: Cardiovascular Disease

## 2020-10-30 ENCOUNTER — Encounter: Payer: Self-pay | Admitting: Cardiovascular Disease

## 2020-10-30 ENCOUNTER — Other Ambulatory Visit: Payer: Self-pay

## 2020-10-30 VITALS — BP 122/58 | HR 72 | Ht 64.0 in | Wt 138.0 lb

## 2020-10-30 DIAGNOSIS — I2510 Atherosclerotic heart disease of native coronary artery without angina pectoris: Secondary | ICD-10-CM | POA: Diagnosis not present

## 2020-10-30 NOTE — Patient Instructions (Signed)
Medication Instructions:  Your physician recommends that you continue on your current medications as directed. Please refer to the Current Medication list given to you today.  *If you need a refill on your cardiac medications before your next appointment, please call your pharmacy*   Lab Work: TODAY: BMET, Lipids, ALT, Vitamin D If you have labs (blood work) drawn today and your tests are completely normal, you will receive your results only by: Marland Kitchen MyChart Message (if you have MyChart) OR . A paper copy in the mail If you have any lab test that is abnormal or we need to change your treatment, we will call you to review the results.   Testing/Procedures: none   Follow-Up: At [facility], you and your health needs are our priority.  As part of our continuing mission to provide you with exceptional heart care, we have created designated Provider Care Teams.  These Care Teams include your primary Cardiologist (physician) and Advanced Practice Providers (APPs -  Physician Assistants and Nurse Practitioners) who all work together to provide you with the care you need, when you need it.   Your next appointment:   6 month(s)  The format for your next appointment:   In Person  Provider:   You may see Mertie Moores, MD or one of the following Advanced Practice Providers on your designated Care Team:    Richardson Dopp, PA-C  [place], [place]

## 2020-11-02 DIAGNOSIS — Z20828 Contact with and (suspected) exposure to other viral communicable diseases: Secondary | ICD-10-CM | POA: Diagnosis not present

## 2020-11-03 ENCOUNTER — Telehealth: Payer: Self-pay

## 2020-11-03 DIAGNOSIS — I2510 Atherosclerotic heart disease of native coronary artery without angina pectoris: Secondary | ICD-10-CM

## 2020-11-03 MED ORDER — POTASSIUM CHLORIDE ER 10 MEQ PO TBCR: EXTENDED_RELEASE_TABLET | ORAL | 3 refills | Status: DC

## 2020-11-03 MED ORDER — TORSEMIDE 20 MG PO TABS: ORAL_TABLET | ORAL | 3 refills | Status: DC

## 2020-11-03 NOTE — Telephone Encounter (Signed)
RN spoke with Donna Ortega, patients son regarding medication changes and date to return for labs. Son verbalizes understanding. Labs scheduled for 12/28/20. RN encouraged son to contact the office with any needs or concerns.

## 2020-11-03 NOTE — Telephone Encounter (Signed)
-----   Message from Thayer Headings, MD sent at 10/30/2020  5:16 PM EDT ----- Creatinine is a little bit higher than it was several months ago.  She is doing great on the torsemide but this might be a bit too strong for her.  We will reduce the torsemide 20 mg to 4 times a week ( M,W,F,S) and also reduce the potassium chloride 10 mew  to 4 times a week ( M,W,F,S) .

## 2020-11-09 LAB — LIPID PANEL
Chol/HDL Ratio: 4 ratio (ref 0.0–4.4)
Cholesterol, Total: 367 mg/dL — ABNORMAL HIGH (ref 100–199)
HDL: 91 mg/dL (ref >39)
LDL Chol Calc (NIH): 254 mg/dL — ABNORMAL HIGH (ref 0–99)
Triglycerides: 127 mg/dL (ref 0–149)
VLDL Cholesterol Cal: 22 mg/dL (ref 5–40)

## 2020-11-09 LAB — BASIC METABOLIC PANEL
BUN/Creatinine Ratio: 17 (ref 12–28)
BUN: 28 mg/dL (ref 10–36)
CO2: 23 mmol/L (ref 20–29)
Calcium: 9.4 mg/dL (ref 8.7–10.3)
Chloride: 101 mmol/L (ref 96–106)
Creatinine, Ser: 1.68 mg/dL — ABNORMAL HIGH (ref 0.57–1.00)
Glucose: 97 mg/dL (ref 65–99)
Potassium: 4.2 mmol/L (ref 3.5–5.2)
Sodium: 140 mmol/L (ref 134–144)
eGFR: 28 mL/min/{1.73_m2} — ABNORMAL LOW (ref >59)

## 2020-11-09 LAB — ALT: ALT: 7 IU/L (ref 0–32)

## 2020-11-09 LAB — VITAMIN D 1,25 DIHYDROXY
Vitamin D 1, 25 (OH)2 Total: 37 pg/mL
Vitamin D2 1, 25 (OH)2: <10 pg/mL
Vitamin D3 1, 25 (OH)2: 34 pg/mL

## 2020-11-23 DIAGNOSIS — Z20828 Contact with and (suspected) exposure to other viral communicable diseases: Secondary | ICD-10-CM | POA: Diagnosis not present

## 2020-11-30 DIAGNOSIS — M810 Age-related osteoporosis without current pathological fracture: Secondary | ICD-10-CM | POA: Diagnosis not present

## 2020-11-30 DIAGNOSIS — Z7901 Long term (current) use of anticoagulants: Secondary | ICD-10-CM | POA: Diagnosis not present

## 2020-11-30 DIAGNOSIS — D6869 Other thrombophilia: Secondary | ICD-10-CM | POA: Diagnosis not present

## 2020-11-30 DIAGNOSIS — Z008 Encounter for other general examination: Secondary | ICD-10-CM | POA: Diagnosis not present

## 2020-11-30 DIAGNOSIS — R609 Edema, unspecified: Secondary | ICD-10-CM | POA: Diagnosis not present

## 2020-11-30 DIAGNOSIS — I4891 Unspecified atrial fibrillation: Secondary | ICD-10-CM | POA: Diagnosis not present

## 2020-11-30 DIAGNOSIS — Z20828 Contact with and (suspected) exposure to other viral communicable diseases: Secondary | ICD-10-CM | POA: Diagnosis not present

## 2020-11-30 DIAGNOSIS — Z950 Presence of cardiac pacemaker: Secondary | ICD-10-CM | POA: Diagnosis not present

## 2020-12-07 DIAGNOSIS — Z20828 Contact with and (suspected) exposure to other viral communicable diseases: Secondary | ICD-10-CM | POA: Diagnosis not present

## 2020-12-28 ENCOUNTER — Other Ambulatory Visit: Payer: Medicare HMO

## 2020-12-28 DIAGNOSIS — Z20828 Contact with and (suspected) exposure to other viral communicable diseases: Secondary | ICD-10-CM | POA: Diagnosis not present

## 2021-01-03 ENCOUNTER — Emergency Department (HOSPITAL_COMMUNITY): Payer: Medicare HMO

## 2021-01-03 ENCOUNTER — Other Ambulatory Visit: Payer: Self-pay

## 2021-01-03 ENCOUNTER — Emergency Department (HOSPITAL_COMMUNITY)
Admission: EM | Admit: 2021-01-03 | Discharge: 2021-01-03 | Disposition: A | Discharge status: Home / Self Care | Payer: Medicare HMO | Attending: Emergency Medicine | Admitting: Emergency Medicine

## 2021-01-03 ENCOUNTER — Encounter (HOSPITAL_COMMUNITY): Payer: Self-pay | Admitting: Emergency Medicine

## 2021-01-03 DIAGNOSIS — Z950 Presence of cardiac pacemaker: Secondary | ICD-10-CM | POA: Diagnosis not present

## 2021-01-03 DIAGNOSIS — I129 Hypertensive chronic kidney disease with stage 1 through stage 4 chronic kidney disease, or unspecified chronic kidney disease: Secondary | ICD-10-CM | POA: Diagnosis not present

## 2021-01-03 DIAGNOSIS — J189 Pneumonia, unspecified organism: Secondary | ICD-10-CM

## 2021-01-03 DIAGNOSIS — J1282 Pneumonia due to coronavirus disease 2019: Secondary | ICD-10-CM | POA: Diagnosis not present

## 2021-01-03 DIAGNOSIS — R059 Cough, unspecified: Secondary | ICD-10-CM | POA: Diagnosis not present

## 2021-01-03 DIAGNOSIS — N183 Chronic kidney disease, stage 3 unspecified: Secondary | ICD-10-CM | POA: Diagnosis not present

## 2021-01-03 DIAGNOSIS — U071 COVID-19: Secondary | ICD-10-CM | POA: Insufficient documentation

## 2021-01-03 DIAGNOSIS — I2510 Atherosclerotic heart disease of native coronary artery without angina pectoris: Secondary | ICD-10-CM | POA: Diagnosis not present

## 2021-01-03 DIAGNOSIS — Z79899 Other long term (current) drug therapy: Secondary | ICD-10-CM | POA: Diagnosis not present

## 2021-01-03 DIAGNOSIS — R0602 Shortness of breath: Secondary | ICD-10-CM | POA: Diagnosis not present

## 2021-01-03 MED ORDER — DOXYCYCLINE HYCLATE 100 MG PO CAPS: 100.0000 mg | ORAL_CAPSULE | Freq: Two times a day (BID) | ORAL | 0 refills | Status: DC

## 2021-01-03 NOTE — ED Provider Notes (Signed)
[facility] DEPT Provider Note   CSN: [id number] Arrival date & time: 01/03/21  1259     History No chief complaint on file.   Donna Ortega is a 87 y.o. female.  Donna Ortega , a 87 y.o. female  was evaluated in triage.  Pt complains of feeling unwell, recently had COVID, per family had congestion on Monday and Tuesday and was improving, today with worsening cough and is less active today and skipped church today. No fevers since day 1. No history of asthma or chronic lung disease. Not treated with antivirals. Had COVID vaccines, booster maybe?        Past Medical History:  Diagnosis Date  . Arthritis    osteo   in back  . Coronary artery disease 2007   3 vessel CABG with LIMA-LAD, SVG to diag, and SVG - Ramus  . Diverticulitis   . GI bleeding 09/09/2013  . Hearing loss of left ear 07/2013  . History of atrial fibrillation; review of all ECG are most consistent with atrial tachycardia    had rash with amiodarone; no anticoagulation due to GI bleed in December 2012  . History of GI bleed Dec 2012  . History of hyperkalemia   . Hyperlipidemia   . Hypertension   . Hyponatremia   . Pacemaker-Medtronic 02/09/2012  . Palpitations     Patient Active Problem List   Diagnosis Date Noted  . Hypovolemia   . AKI (acute kidney injury) ([place]) 04/01/2017  . Small bowel obstruction ([place]) 03/31/2017  . Edema 04/09/2014  . Vertebral compression fracture ([place]) 10/29/2013  . Compressed spine fracture ([place]) 10/29/2013  . Gastric erosions 10/09/2013  . Decreased hearing of both ears 10/09/2013  . Normocytic anemia 06/26/2013  . Osteoporosis  Fosamax restarted 10/2013 06/18/2013  . Osteopenia 05/05/2013  . Palpitation 02/19/2013  . Insomnia 02/18/2013  . Candida esophagitis ([place]) 01/22/2013  . Gastric ulcer 01/22/2013  . UTI (urinary tract infection) 01/17/2013  . HLD (hyperlipidemia) 06/19/2012  . Atrial tachycardia ([place]) 05/22/2012  .  Pacemaker-Medtronic 02/09/2012  . Sinus node dysfunction ([place]) 02/06/2012  . Syncope and collapse 02/06/2012  . Benign hypertension 09/27/2011  . Rash 09/19/2011  . UTI (lower urinary tract infection) 08/21/2011  . Fatigue 08/21/2011  . Peptic ulcer disease with hemorrhage 08/14/2011  . S/P vertebroplasty 08/14/2011  . H/O Spinal surgery 08/14/2011  . Chronic kidney disease (CKD), stage III (moderate) ([place]) 08/05/2011  . Anemia associated with acute blood loss 08/04/2011  . Hyponatremia 08/04/2011  . GI bleed 08/04/2011  . Dyslipidemia 08/04/2011  . Melena 08/03/2011  . CAD (coronary artery disease) s/p CABG 01/11/2011  . Atrial fibrillation, chronic ([place]) 01/11/2011  . HTN (hypertension) 01/11/2011  . Arteriosclerosis of coronary artery 01/11/2011    Past Surgical History:  Procedure Laterality Date  . ABDOMINAL HYSTERECTOMY    . COLONOSCOPY N/A 09/12/2013   Procedure: COLONOSCOPY;  Surgeon: Wonda Horner, MD;  Location: [facility] ENDOSCOPY;  Service: Endoscopy;  Laterality: N/A;  . CORONARY ARTERY BYPASS GRAFT  Feb 2007   Dr. Cyndia Bent;   . ESOPHAGOGASTRODUODENOSCOPY  08/04/2011   Procedure: ESOPHAGOGASTRODUODENOSCOPY (EGD);  Surgeon: Cleotis Nipper, MD;  Location: [facility] ENDOSCOPY;  Service: Endoscopy;  Laterality: N/A;  do at bedside  . ESOPHAGOGASTRODUODENOSCOPY N/A 09/11/2013   Procedure: ESOPHAGOGASTRODUODENOSCOPY (EGD);  Surgeon: Jeryl Columbia, MD;  Location: [facility] ENDOSCOPY;  Service: Endoscopy;  Laterality: N/A;  . IR KYPHO LUMBAR INC FX REDUCE BONE BX UNI/BIL CANNULATION INC/IMAGING  07/16/2018  .  IR KYPHO THORACIC WITH BONE BIOPSY  04/11/2017  . IR PATIENT EVAL TECH 0-60 MINS  07/13/2018  . IR RADIOLOGIST EVAL & MGMT  04/04/2017  . MAZE    . pace maker    . PARTIAL HYSTERECTOMY    . PERMANENT PACEMAKER INSERTION N/A 02/08/2012   Procedure: PERMANENT PACEMAKER INSERTION;  Surgeon: Deboraha Sprang, MD;  Location: [facility] CATH LAB;  Service: Cardiovascular;  Laterality: N/A;     OB History    No obstetric history on file.     Family History  Problem Relation Age of Onset  . Heart failure Father   . Kidney failure Father   . Bone cancer Brother   . Kidney failure Sister   . Colon cancer Sister   . Cancer Sister     Social History   Tobacco Use  . Smoking status: Never Smoker  . Smokeless tobacco: Never Used  Vaping Use  . Vaping Use: Never used  Substance Use Topics  . Alcohol use: No  . Drug use: No    Home Medications Prior to Admission medications   Medication Sig Start Date End Date Taking? Authorizing Provider  doxycycline (VIBRAMYCIN) 100 MG capsule Take 1 capsule (100 mg total) by mouth 2 (two) times daily. 01/03/21  Yes Tacy Learn, PA-C  Ascorbic Acid (VITAMIN C) 500 MG CAPS Take 1 tablet by mouth daily. With rose hips    [provider]  diltiazem (TIAZAC) 360 MG 24 hr capsule TAKE ONE CAPSULE BY MOUTH DAILY . 06/03/20   Nahser, Wonda Cheng, MD  diphenhydramine-acetaminophen (TYLENOL PM) 25-500 MG TABS tablet Take 2 tablets by mouth at bedtime.    [provider]  ELIQUIS 5 MG TABS tablet TAKE ONE TABLET BY MOUTH TWICE A DAY 09/25/20   Nahser, Wonda Cheng, MD  potassium chloride (KLOR-CON) 10 MEQ tablet Take one tablet by mouth on Monday, Wednesday, Friday and Saturday. 11/03/20   Nahser, Wonda Cheng, MD  rosuvastatin (CRESTOR) 10 MG tablet TAKE ONE TABLET BY MOUTH DAILY 11/22/19   Nahser, Wonda Cheng, MD  torsemide [facility]) 20 MG tablet Take one tablet on Monday, Wednesday, Friday and Saturday. 11/03/20   Nahser, Wonda Cheng, MD  TYMLOS 3120 MCG/1.56ML SOPN Inject as directed daily. 11/14/18   [provider]    Allergies    Amiodarone, Aspirin, Atorvastatin, Lipitor [atorvastatin calcium], Nsaids, Tolmetin, Naproxen, Niacin, and Niaspan [niacin er]  Review of Systems   Review of Systems  Constitutional: Negative for chills and fever.  HENT: Positive for congestion.   Respiratory: Positive for cough. Negative for shortness of breath.    Cardiovascular: Negative for chest pain.  Gastrointestinal: Negative for nausea and vomiting.  Musculoskeletal: Negative for arthralgias and myalgias.  Skin: Negative for rash and wound.  Allergic/Immunologic: Negative for immunocompromised state.  Neurological: Negative for weakness.  Psychiatric/Behavioral: Negative for confusion.  All other systems reviewed and are negative.   Physical Exam Updated Vital Signs BP 133/64 (BP Location: Right Arm)   Pulse 71   Temp 98.5 F (36.9 C) (Oral)   Resp 18   Ht 5\' 4"  (1.626 m)   Wt 55.8 kg   SpO2 98%   BMI 21.11 kg/m   Physical Exam Vitals and nursing note reviewed.  Constitutional:      General: She is not in acute distress.    Appearance: She is well-developed. She is not diaphoretic.  HENT:     Head: Normocephalic and atraumatic.     Mouth/Throat:  Mouth: Mucous membranes are moist.  Eyes:     Conjunctiva/sclera: Conjunctivae normal.  Cardiovascular:     Rate and Rhythm: Normal rate and regular rhythm.     Heart sounds: Normal heart sounds.  Pulmonary:     Effort: Pulmonary effort is normal.     Breath sounds: Examination of the right-lower field reveals rhonchi. Rhonchi present.  Abdominal:     Palpations: Abdomen is soft.     Tenderness: There is no abdominal tenderness.  Musculoskeletal:        General: No swelling or tenderness.     Right lower leg: No edema.     Left lower leg: No edema.  Skin:    General: Skin is warm and dry.     Findings: No erythema or rash.  Neurological:     Mental Status: She is alert and oriented to person, place, and time.  Psychiatric:        Behavior: Behavior normal.     ED Results / Procedures / Treatments   Labs (all labs ordered are listed, but only abnormal results are displayed) Labs Reviewed - No data to display  EKG None  Radiology DG Chest [facility] 1 View  Result Date: 01/03/2021 CLINICAL DATA:  Cough, shortness of breath, COVID-19 positive EXAM: PORTABLE CHEST  1 VIEW COMPARISON:  01/29/2018 FINDINGS: Left-sided implanted cardiac device. Post CABG changes. Stable cardiomediastinal contours. Atherosclerotic calcification of the aortic knob. Streaky opacity within the right lung base. No evidence of pleural effusion. No pneumothorax. Bony structures appear demineralized. IMPRESSION: Streaky opacity within the right lung base may represent atelectasis or developing infiltrate. Electronically Signed   By: Davina Poke D.O.   On: 01/03/2021 13:49    Procedures Procedures   Medications Ordered in ED Medications - No data to display  ED Course  I have reviewed the triage vital signs and the nursing notes.  Pertinent labs & imaging results that were available during my care of the patient were reviewed by me and considered in my medical decision making (see chart for details).  Clinical Course as of 01/03/21 1519  Sun Jan 04, 4912  601 87 year old female with cough, recent COVID last week and was improving. Patient is well-appearing, coarse lung sounds right base. Patient is able to maintain O2 sat of 97% while ambulating on room air. Discussed with Dr. Vallery Ridge, ER attending, plan is to discharge with prescription for doxycycline.  Given strict return to ER precautions, recheck with PCP this week and recommend repeat chest x-ray in 6 weeks to ensure resolution. [LM]  1519 Outside of treatment window for oral antiviral therapy.  Donna Ortega was evaluated in Emergency Department on 01/03/2021 for the symptoms described in the history of present illness. She was evaluated in the context of the global COVID-19 pandemic, which necessitated consideration that the patient might be at risk for infection with the SARS-CoV-2 virus that causes COVID-19. Institutional protocols and algorithms that pertain to the evaluation of patients at risk for COVID-19 are in a state of rapid change based on information released by regulatory bodies including the CDC and federal  and state organizations. These policies and algorithms were followed during the patient's care in the ED.  [LM]    Clinical Course User Index [LM] Roque Lias   MDM Rules/Calculators/A&P                          Final Clinical Impression(s) / ED  Diagnoses Final diagnoses:  Community acquired pneumonia of right lower lobe of lung  COVID    Rx / DC Orders ED Discharge Orders         Ordered    doxycycline (VIBRAMYCIN) 100 MG capsule  2 times daily        01/03/21 1451           Roque Lias 01/03/21 1519    Charlesetta Shanks, MD 01/19/21 1031

## 2021-01-03 NOTE — ED Notes (Signed)
Pt ambulated over 30 feet and maintained O2 at 97 and HR 79.

## 2021-01-03 NOTE — Discharge Instructions (Addendum)
Your chest x-ray today does show pneumonia.  This may be viral and related to COVID however we will try covering with antibiotics.  Be sure to complete the full course.  Recommend recheck with your doctor.  He will need a repeat chest x-ray in 6 weeks to make sure this pneumonia has cleared.  Return to emergency room for new or worsening symptoms.

## 2021-01-03 NOTE — ED Triage Notes (Signed)
Dx COVID positive on Tuesday, has a cough and some decrease in usually activities. No fevers. At an independent living at [facility].

## 2021-01-03 NOTE — ED Provider Notes (Addendum)
Emergency Medicine Provider Triage Evaluation Note  Donna Ortega , a 87 y.o. female  was evaluated in triage.  Pt complains of feeling unwell, recently had COVID, per family had congestion on Monday and Tuesday and was improving, today with worsening cough and is less active today and skipped church today. No fevers since day 1. No history of asthma or chronic lung disease. Not treated with antivirals. Had COVID vaccines, booster maybe?  Review of Systems  Positive: cough Negative: fever  Physical Exam  There were no vitals taken for this visit. Gen:   Awake, no distress   Resp:  Normal effort  MSK:   Moves extremities without difficulty  Other:  Well appearing  Medical Decision Making  Medically screening exam initiated at 1:05 PM.  Appropriate orders placed.  KEYERRA LAMERE was informed that the remainder of the evaluation will be completed by another provider, this initial triage assessment does not replace that evaluation, and the importance of remaining in the ED until their evaluation is complete.     Tacy Learn, PA-C 01/03/21 1311    Tacy Learn, PA-C 01/03/21 1312    Daleen Bo, MD 01/03/21 [phone]

## 2021-01-04 DIAGNOSIS — Z20828 Contact with and (suspected) exposure to other viral communicable diseases: Secondary | ICD-10-CM | POA: Diagnosis not present

## 2021-01-11 DIAGNOSIS — Z20828 Contact with and (suspected) exposure to other viral communicable diseases: Secondary | ICD-10-CM | POA: Diagnosis not present

## 2021-01-25 DIAGNOSIS — Z20828 Contact with and (suspected) exposure to other viral communicable diseases: Secondary | ICD-10-CM | POA: Diagnosis not present

## 2021-01-28 ENCOUNTER — Ambulatory Visit (INDEPENDENT_AMBULATORY_CARE_PROVIDER_SITE_OTHER): Payer: Medicare HMO

## 2021-01-28 DIAGNOSIS — I495 Sick sinus syndrome: Secondary | ICD-10-CM

## 2021-01-28 DIAGNOSIS — Z20828 Contact with and (suspected) exposure to other viral communicable diseases: Secondary | ICD-10-CM | POA: Diagnosis not present

## 2021-01-28 LAB — CUP PACEART REMOTE DEVICE CHECK
Battery Voltage: 2.89 V
Brady Statistic AP VP Percent: 0.35 %
Brady Statistic AP VS Percent: 17.48 %
Brady Statistic AS VP Percent: 0.99 %
Brady Statistic AS VS Percent: 81.19 %
Brady Statistic RA Percent Paced: 17.54 %
Brady Statistic RV Percent Paced: 1.23 %
Date Time Interrogation Session: 20220615150027
Implantable Lead Implant Date: 20130626
Implantable Lead Implant Date: 20130626
Implantable Lead Location: 753859
Implantable Lead Location: 753860
Implantable Pulse Generator Implant Date: 20130626
Lead Channel Impedance Value: 368 Ohm
Lead Channel Impedance Value: 368 Ohm
Lead Channel Sensing Intrinsic Amplitude: 0.551 mV
Lead Channel Sensing Intrinsic Amplitude: 6.244 mV
Lead Channel Setting Pacing Amplitude: 2 V
Lead Channel Setting Pacing Amplitude: 2.5 V
Lead Channel Setting Pacing Pulse Width: 0.4 ms
Lead Channel Setting Sensing Sensitivity: 0.9 mV

## 2021-02-01 DIAGNOSIS — Z20828 Contact with and (suspected) exposure to other viral communicable diseases: Secondary | ICD-10-CM | POA: Diagnosis not present

## 2021-02-04 DIAGNOSIS — Z20828 Contact with and (suspected) exposure to other viral communicable diseases: Secondary | ICD-10-CM | POA: Diagnosis not present

## 2021-02-08 DIAGNOSIS — Z20828 Contact with and (suspected) exposure to other viral communicable diseases: Secondary | ICD-10-CM | POA: Diagnosis not present

## 2021-02-11 DIAGNOSIS — Z20828 Contact with and (suspected) exposure to other viral communicable diseases: Secondary | ICD-10-CM | POA: Diagnosis not present

## 2021-02-16 DIAGNOSIS — Z20828 Contact with and (suspected) exposure to other viral communicable diseases: Secondary | ICD-10-CM | POA: Diagnosis not present

## 2021-02-18 DIAGNOSIS — Z20828 Contact with and (suspected) exposure to other viral communicable diseases: Secondary | ICD-10-CM | POA: Diagnosis not present

## 2021-02-18 NOTE — Progress Notes (Signed)
Remote pacemaker transmission.   

## 2021-02-22 DIAGNOSIS — Z20828 Contact with and (suspected) exposure to other viral communicable diseases: Secondary | ICD-10-CM | POA: Diagnosis not present

## 2021-03-08 ENCOUNTER — Telehealth: Payer: Self-pay | Admitting: Cardiovascular Disease

## 2021-03-08 DIAGNOSIS — I2510 Atherosclerotic heart disease of native coronary artery without angina pectoris: Secondary | ICD-10-CM

## 2021-03-08 DIAGNOSIS — M8580 Other specified disorders of bone density and structure, unspecified site: Secondary | ICD-10-CM

## 2021-03-08 DIAGNOSIS — E785 Hyperlipidemia, unspecified: Secondary | ICD-10-CM

## 2021-03-08 NOTE — Telephone Encounter (Signed)
Returned call to son.  He is requesting repeat lab work of BMP, vitamin D and lipid profile.  Last ordered by Dr. Acie Fredrickson in March.  Advised would ask Dr. Acie Fredrickson.  Per Dr. Acie Fredrickson ok to order.  Attempted to call son back to schedule lab work.  Unable to reach son.  ---when son calls back schedule lab work.  Labs are entered.

## 2021-03-08 NOTE — Telephone Encounter (Signed)
Pt's son Kara Pacer would like to know if Dr. Acie Fredrickson can please put in a lab order for this pt. Please advise

## 2021-03-11 ENCOUNTER — Other Ambulatory Visit: Payer: Medicare HMO | Admitting: *Deleted

## 2021-03-11 ENCOUNTER — Other Ambulatory Visit: Payer: Self-pay

## 2021-03-11 DIAGNOSIS — Z20828 Contact with and (suspected) exposure to other viral communicable diseases: Secondary | ICD-10-CM | POA: Diagnosis not present

## 2021-03-11 DIAGNOSIS — E785 Hyperlipidemia, unspecified: Secondary | ICD-10-CM | POA: Diagnosis not present

## 2021-03-11 DIAGNOSIS — M8580 Other specified disorders of bone density and structure, unspecified site: Secondary | ICD-10-CM | POA: Diagnosis not present

## 2021-03-11 DIAGNOSIS — I2510 Atherosclerotic heart disease of native coronary artery without angina pectoris: Secondary | ICD-10-CM

## 2021-03-11 DIAGNOSIS — E559 Vitamin D deficiency, unspecified: Secondary | ICD-10-CM | POA: Diagnosis not present

## 2021-03-11 DIAGNOSIS — Z1321 Encounter for screening for nutritional disorder: Secondary | ICD-10-CM | POA: Diagnosis not present

## 2021-03-11 NOTE — Telephone Encounter (Signed)
Appears pt is having labs done today 03/11/21 .Adonis Housekeeper

## 2021-03-16 ENCOUNTER — Other Ambulatory Visit: Payer: Self-pay | Admitting: Cardiovascular Disease

## 2021-03-18 DIAGNOSIS — Z20828 Contact with and (suspected) exposure to other viral communicable diseases: Secondary | ICD-10-CM | POA: Diagnosis not present

## 2021-03-18 LAB — BASIC METABOLIC PANEL
BUN/Creatinine Ratio: 21 (ref 12–28)
BUN: 27 mg/dL (ref 10–36)
CO2: 23 mmol/L (ref 20–29)
Calcium: 9.4 mg/dL (ref 8.7–10.3)
Chloride: 99 mmol/L (ref 96–106)
Creatinine, Ser: 1.27 mg/dL — ABNORMAL HIGH (ref 0.57–1.00)
Glucose: 87 mg/dL (ref 65–99)
Potassium: 4 mmol/L (ref 3.5–5.2)
Sodium: 139 mmol/L (ref 134–144)
eGFR: 40 mL/min/{1.73_m2} — ABNORMAL LOW (ref >59)

## 2021-03-18 LAB — VITAMIN D 1,25 DIHYDROXY
Vitamin D 1, 25 (OH)2 Total: 61 pg/mL
Vitamin D2 1, 25 (OH)2: <10 pg/mL
Vitamin D3 1, 25 (OH)2: 61 pg/mL

## 2021-03-18 LAB — LIPID PANEL
Chol/HDL Ratio: 3.7 ratio (ref 0.0–4.4)
Cholesterol, Total: 318 mg/dL — ABNORMAL HIGH (ref 100–199)
HDL: 86 mg/dL (ref >39)
LDL Chol Calc (NIH): 221 mg/dL — ABNORMAL HIGH (ref 0–99)
Triglycerides: 75 mg/dL (ref 0–149)
VLDL Cholesterol Cal: 11 mg/dL (ref 5–40)

## 2021-03-23 ENCOUNTER — Other Ambulatory Visit: Payer: Self-pay | Admitting: Cardiovascular Disease

## 2021-03-23 DIAGNOSIS — I480 Paroxysmal atrial fibrillation: Secondary | ICD-10-CM

## 2021-03-23 NOTE — Telephone Encounter (Addendum)
[facility], where the pt resides and had to leave a message for Barnett Applebaum with their [place] to call back regarding a recent weight.   Spoke with Barnett Applebaum at [facility] ans she stated the pt weighs daily and today weight was 134lbs and pt confirmed as well.

## 2021-03-23 NOTE — Telephone Encounter (Signed)
Prescription refill request for Eliquis received. Indication: Afib Last office visit: 10/30/20 ([place]) Scr: 1.27 (03/11/21) Age: 87 Weight: 61kg (Per [place] at [facility])   Appropriate dose and refill sent to requested pharmacy.

## 2021-03-25 DIAGNOSIS — Z20828 Contact with and (suspected) exposure to other viral communicable diseases: Secondary | ICD-10-CM | POA: Diagnosis not present

## 2021-03-29 DIAGNOSIS — Z20828 Contact with and (suspected) exposure to other viral communicable diseases: Secondary | ICD-10-CM | POA: Diagnosis not present

## 2021-04-01 DIAGNOSIS — Z8616 Personal history of COVID-19: Secondary | ICD-10-CM | POA: Diagnosis not present

## 2021-04-05 DIAGNOSIS — Z20828 Contact with and (suspected) exposure to other viral communicable diseases: Secondary | ICD-10-CM | POA: Diagnosis not present

## 2021-04-08 DIAGNOSIS — Z20828 Contact with and (suspected) exposure to other viral communicable diseases: Secondary | ICD-10-CM | POA: Diagnosis not present

## 2021-04-15 DIAGNOSIS — Z20822 Contact with and (suspected) exposure to covid-19: Secondary | ICD-10-CM | POA: Diagnosis not present

## 2021-04-19 DIAGNOSIS — Z20828 Contact with and (suspected) exposure to other viral communicable diseases: Secondary | ICD-10-CM | POA: Diagnosis not present

## 2021-04-26 DIAGNOSIS — Z20828 Contact with and (suspected) exposure to other viral communicable diseases: Secondary | ICD-10-CM | POA: Diagnosis not present

## 2021-04-29 DIAGNOSIS — Z20828 Contact with and (suspected) exposure to other viral communicable diseases: Secondary | ICD-10-CM | POA: Diagnosis not present

## 2021-05-03 DIAGNOSIS — Z20828 Contact with and (suspected) exposure to other viral communicable diseases: Secondary | ICD-10-CM | POA: Diagnosis not present

## 2021-05-04 DIAGNOSIS — M810 Age-related osteoporosis without current pathological fracture: Secondary | ICD-10-CM | POA: Diagnosis not present

## 2021-05-06 ENCOUNTER — Ambulatory Visit: Payer: Medicare HMO | Admitting: Cardiovascular Disease

## 2021-05-10 DIAGNOSIS — Z20828 Contact with and (suspected) exposure to other viral communicable diseases: Secondary | ICD-10-CM | POA: Diagnosis not present

## 2021-05-24 ENCOUNTER — Other Ambulatory Visit: Payer: Self-pay | Admitting: Cardiovascular Disease

## 2021-05-27 DIAGNOSIS — Z20828 Contact with and (suspected) exposure to other viral communicable diseases: Secondary | ICD-10-CM | POA: Diagnosis not present

## 2021-06-08 ENCOUNTER — Other Ambulatory Visit: Payer: Self-pay | Admitting: Cardiovascular Disease

## 2021-06-29 DIAGNOSIS — L820 Inflamed seborrheic keratosis: Secondary | ICD-10-CM | POA: Diagnosis not present

## 2021-06-29 DIAGNOSIS — L853 Xerosis cutis: Secondary | ICD-10-CM | POA: Diagnosis not present

## 2021-06-29 DIAGNOSIS — D1801 Hemangioma of skin and subcutaneous tissue: Secondary | ICD-10-CM | POA: Diagnosis not present

## 2021-06-29 DIAGNOSIS — L570 Actinic keratosis: Secondary | ICD-10-CM | POA: Diagnosis not present

## 2021-06-29 DIAGNOSIS — L821 Other seborrheic keratosis: Secondary | ICD-10-CM | POA: Diagnosis not present

## 2021-06-29 DIAGNOSIS — L814 Other melanin hyperpigmentation: Secondary | ICD-10-CM | POA: Diagnosis not present

## 2021-07-18 ENCOUNTER — Encounter: Payer: Self-pay | Admitting: Cardiovascular Disease

## 2021-07-18 NOTE — Progress Notes (Signed)
Donna Ortega Date of Birth  1926/06/18 [facility]      [address]    [address]    [place], [place]  [address]      Problems:  1. CAD - s/p CABG 2007, 2. PACs 3. Drug Rash - amiodarone 4. GI Bleed 5. Anemia 6. hyponatremia  Previous notes:   Donna Ortega is an 87 year old female with a history of coronary artery disease and atrial fibrillation. She was admitted to the hospital in December with a GI bleed, anemia, and rapid atrial fibrillation. She was discharged with the additional medications of amiodarone and Cardizem CD.  She was seen in mid January and was doing fairly well.  She stopped her Amiodarone due to drug rash.  She still has generalized fatigue and generalized weakness. She wonders if it may be due to the Lasix and potassium.  April 17, 2012 She has been more fatigued recently.  She played golf this past weekend and still is not over the exertion.  Dec. 3, 2013 :  She is doing well.  Dr. Caryl Comes stopped her Pradaxa due to GI bleed.    She is doing very well.   January 15, 2013:  Donna Ortega is doing OK.  She does not have as much energy as she would like.   She has continued to have problems with hyponatremia.  She is on maxzide which is likely exacerbating this.  No CP, no dyspnea.    Dec. 1, 2014:  Donna Ortega is doing well.  No CP or dyspnea.  Rhythm is stable.   Her Hb was a bit low when last checked by her medical doctor.  Has not started on the iron tabs.   No CP, not playing golf any more because of back pain ( s/p kyphoplasty)  .  She is still walking around the block on occasion.   She is now in her own apartment.   Sept. 9, 2015:  Donna Ortega is doing well.  She is not having any problems   March 02, 2015: No CP , no dyspnea.   Feb. 13, 2017:  Fell 3 weeks ago .  Now is using a cane for stability  She has occasional episodes of atrial fib - last 5 minutes or so.   Is on Eliquis 2.5 BID  Has had some bladder issues   Oct. 6, 2017:  Donna Ortega is seen back for  her CAD and PAF  Has some leg swelling .  Was on Maxzide - this is off her list - perhaps because of hypotension . Lisinopril was stopped at that time    November 29, 2016:  Doing well, no angina  Knows that she is starting to forget things on occasion Has been having some swelling in her feet and ankles.  BP has been a bit higher She avoids salty foods.   Nov. 21, 2018:  Doing well.  No CP or dyspnea Had kyphoplasty this past summer.  Very active.    Still driving    Jan 02, 8314:  Donna Ortega is seen today for follow-up of her coronary artery disease and hx atrial fibrillation. Still driving - not driving to [facility] anymore  No CP or dyspnea   Oct. 25, 2021 Donna Ortega is seen today after a 2 year absence.  She was seen as a virtual visit last year. Hx of CAD, atrial fib Hx of anemia  She has developed some dementia She has been having some issues with leg  edema  intermittant shortness of breath   October 30, 2020: Donna Ortega is seen for follow up .   Seen with her son  Donna Ortega) today  Hx of CAD, atrial fib, anemia,  Moderate dementia  Leg edema Is walking regularly  Lives at [facility].   Dec. 5, 2022: Donna Ortega is seen for follow up of her CAD , atrial fib, anemia  Moderate dementia  Feels well.  Lives at [facility] with others  Is in the walking club.   Stays active    Current Outpatient Medications on File Prior to Visit  Medication Sig Dispense Refill   Ascorbic Acid (VITAMIN C) 500 MG CAPS Take 1 tablet by mouth daily. With rose hips     diltiazem (TIAZAC) 360 MG 24 hr capsule TAKE ONE CAPSULE BY MOUTH DAILY 90 capsule 1   diphenhydramine-acetaminophen (TYLENOL PM) 25-500 MG TABS tablet Take 2 tablets by mouth at bedtime.     ELIQUIS 5 MG TABS tablet TAKE ONE TABLET BY MOUTH TWICE A DAY 60 tablet 4   potassium chloride (KLOR-CON) 10 MEQ tablet TAKE ONE TABLET BY MOUTH DAILY 90 tablet 1   PROLIA 60 MG/ML SOSY injection Inject into the skin once.     rosuvastatin  (CRESTOR) 10 MG tablet TAKE ONE TABLET BY MOUTH DAILY 90 tablet 1   torsemide (DEMADEX) 20 MG tablet TAKE ONE TABLET BY MOUTH DAILY 90 tablet 1   doxycycline (VIBRAMYCIN) 100 MG capsule Take 1 capsule (100 mg total) by mouth 2 (two) times daily. (Patient not taking: Reported on 07/19/2021) 20 capsule 0   TYMLOS 3120 MCG/1.56ML SOPN Inject as directed daily.     No current facility-administered medications on file prior to visit.    Allergies  Allergen Reactions   Amiodarone Hives and Other (See Comments)    ALL OVER BODY HIVES/ ITCH  other   Aspirin Other (See Comments)    Gi bleed  other   Atorvastatin Other (See Comments)    MUSCLE ACHES other   Lipitor [Atorvastatin Calcium] Other (See Comments)    MUSCLE ACHES   Nsaids Other (See Comments)    GI bleed   Tolmetin Other (See Comments)    GI bleed other   Naproxen Other (See Comments)   Niacin Rash and Other (See Comments)    other   Niaspan [Niacin Er] Rash    Past Medical History:  Diagnosis Date   Arthritis    osteo   in back   Coronary artery disease 2007   3 vessel CABG with LIMA-LAD, SVG to diag, and SVG - Ramus   Diverticulitis    GI bleeding 09/09/2013   Hearing loss of left ear 07/2013   History of atrial fibrillation; review of all ECG are most consistent with atrial tachycardia    had rash with amiodarone; no anticoagulation due to GI bleed in December 2012   History of GI bleed Dec 2012   History of hyperkalemia    Hyperlipidemia    Hypertension    Hyponatremia    Pacemaker-Medtronic 02/09/2012   Palpitations     Past Surgical History:  Procedure Laterality Date   ABDOMINAL HYSTERECTOMY     COLONOSCOPY N/A 09/12/2013   Procedure: COLONOSCOPY;  Surgeon: Wonda Horner, MD;  Location: [facility] ENDOSCOPY;  Service: Endoscopy;  Laterality: N/A;   CORONARY ARTERY BYPASS GRAFT  Feb 2007   Dr. Cyndia Bent;    ESOPHAGOGASTRODUODENOSCOPY  08/04/2011   Procedure: ESOPHAGOGASTRODUODENOSCOPY (EGD);  Surgeon: Cleotis Nipper, MD;  Location: [facility] ENDOSCOPY;  Service: Endoscopy;  Laterality: N/A;  do at bedside   ESOPHAGOGASTRODUODENOSCOPY N/A 09/11/2013   Procedure: ESOPHAGOGASTRODUODENOSCOPY (EGD);  Surgeon: Jeryl Columbia, MD;  Location: [facility] ENDOSCOPY;  Service: Endoscopy;  Laterality: N/A;   IR KYPHO LUMBAR INC FX REDUCE BONE BX UNI/BIL CANNULATION INC/IMAGING  07/16/2018   IR KYPHO THORACIC WITH BONE BIOPSY  04/11/2017   IR PATIENT EVAL TECH 0-60 MINS  07/13/2018   IR RADIOLOGIST EVAL & MGMT  04/04/2017   MAZE     pace maker     PARTIAL HYSTERECTOMY     PERMANENT PACEMAKER INSERTION N/A 02/08/2012   Procedure: PERMANENT PACEMAKER INSERTION;  Surgeon: Deboraha Sprang, MD;  Location: [facility] CATH LAB;  Service: Cardiovascular;  Laterality: N/A;    Social History   Tobacco Use  Smoking Status Never  Smokeless Tobacco Never    Social History   Substance and Sexual Activity  Alcohol Use No    Family History  Problem Relation Age of Onset   Heart failure Father    Kidney failure Father    Bone cancer Brother    Kidney failure Sister    Colon cancer Sister    Cancer Sister     Reviw of Systems:  Reviewed in the HPI.  All other systems are negative.   Physical Exam: Blood pressure 138/64, pulse 71, height 5\' 4"  (1.626 m), weight 135 lb 9.6 oz (61.5 kg), SpO2 97 %.  GEN:  elderly female,  NAD  HEENT: Normal NECK: No JVD; No carotid bruits LYMPHATICS: No lymphadenopathy CARDIAC: RRR , no murmurs, rubs, gallops RESPIRATORY:  Clear to auscultation without rales, wheezing or rhonchi  ABDOMEN: Soft, non-tender, non-distended MUSCULOSKELETAL:  No edema; No deformity , she has several excoriations on both of her legs - does not remember any leg trauma to explain these  SKIN: Warm and dry NEUROLOGIC:  Alert and oriented x 3   ECG: July 19, 2021: Demand a pacing.  Prolonged A-R interval.  No ST or T wave changes.   Assessment / Plan:   1. CAD - s/p CABG 2007,     no angina .   Seems to be doing  well      2. Atrial fib:  - is A pacing today    3.  Leg edema:  minimal leg edema today  Has multiple areas of excoriations on both legs.  She does not recall having any trauma to explain these lesions.  I have advised her to check with her dermatologist to see if they have any explanation.  4. GI Bleed  -   no further bleeding.  Continue current medications.  5. Anemia -   Stable   6.  Fatigue:    7. Hyperlipidemia -  Check lipid profile   8. HTN:  .  Blood pressure looks good.  9.  Vit D deficiency :       Mertie Moores, MD  07/19/2021 11:35 AM    [address],  [place], [place]  [address] Pager [phone] Phone: [phone]; Fax: [phone]

## 2021-07-19 ENCOUNTER — Encounter: Payer: Self-pay | Admitting: Cardiovascular Disease

## 2021-07-19 ENCOUNTER — Other Ambulatory Visit: Payer: Self-pay

## 2021-07-19 ENCOUNTER — Ambulatory Visit: Payer: Medicare HMO | Admitting: Cardiovascular Disease

## 2021-07-19 VITALS — BP 138/64 | HR 71 | Ht 64.0 in | Wt 135.6 lb

## 2021-07-19 DIAGNOSIS — I2510 Atherosclerotic heart disease of native coronary artery without angina pectoris: Secondary | ICD-10-CM

## 2021-07-19 DIAGNOSIS — I480 Paroxysmal atrial fibrillation: Secondary | ICD-10-CM | POA: Diagnosis not present

## 2021-07-19 DIAGNOSIS — Z79899 Other long term (current) drug therapy: Secondary | ICD-10-CM

## 2021-07-19 DIAGNOSIS — I482 Chronic atrial fibrillation, unspecified: Secondary | ICD-10-CM

## 2021-07-19 DIAGNOSIS — E785 Hyperlipidemia, unspecified: Secondary | ICD-10-CM | POA: Diagnosis not present

## 2021-07-19 LAB — BASIC METABOLIC PANEL
BUN/Creatinine Ratio: 21 (ref 12–28)
BUN: 25 mg/dL (ref 10–36)
CO2: 24 mmol/L (ref 20–29)
Calcium: 9.5 mg/dL (ref 8.7–10.3)
Chloride: 100 mmol/L (ref 96–106)
Creatinine, Ser: 1.2 mg/dL — ABNORMAL HIGH (ref 0.57–1.00)
Glucose: 69 mg/dL — ABNORMAL LOW (ref 70–99)
Potassium: 4.2 mmol/L (ref 3.5–5.2)
Sodium: 140 mmol/L (ref 134–144)
eGFR: 42 mL/min/{1.73_m2} — ABNORMAL LOW (ref >59)

## 2021-07-19 LAB — LIPID PANEL
Chol/HDL Ratio: 2 ratio (ref 0.0–4.4)
Cholesterol, Total: 220 mg/dL — ABNORMAL HIGH (ref 100–199)
HDL: 109 mg/dL (ref >39)
LDL Chol Calc (NIH): 97 mg/dL (ref 0–99)
Triglycerides: 82 mg/dL (ref 0–149)
VLDL Cholesterol Cal: 14 mg/dL (ref 5–40)

## 2021-07-19 LAB — ALT: ALT: 10 IU/L (ref 0–32)

## 2021-07-19 NOTE — Patient Instructions (Signed)
Medication Instructions:  The current medical regimen is effective;  continue present plan and medications.  *If you need a refill on your cardiac medications before your next appointment, please call your pharmacy*   Lab Work: Please have blood work today (BMP, Lipid and ALT) If you have labs (blood work) drawn today and your tests are completely normal, you will receive your results only by: MyChart Message (if you have MyChart) OR A paper copy in the mail If you have any lab test that is abnormal or we need to change your treatment, we will call you to review the results.  Follow-Up: At [facility], you and your health needs are our priority.  As part of our continuing mission to provide you with exceptional heart care, we have created designated Provider Care Teams.  These Care Teams include your primary Cardiologist (physician) and Advanced Practice Providers (APPs -  Physician Assistants and Nurse Practitioners) who all work together to provide you with the care you need, when you need it.  We recommend signing up for the patient portal called "MyChart".  Sign up information is provided on this After Visit Summary.  MyChart is used to connect with patients for Virtual Visits (Telemedicine).  Patients are able to view lab/test results, encounter notes, upcoming appointments, etc.  Non-urgent messages can be sent to your provider as well.   To learn more about what you can do with MyChart, go to [email or web address].    Your next appointment:   6 month(s)  The format for your next appointment:   In Person  Provider:   Mertie Moores, MD     Thank you for choosing [facility]!!

## 2021-08-19 DIAGNOSIS — Z20822 Contact with and (suspected) exposure to covid-19: Secondary | ICD-10-CM | POA: Diagnosis not present

## 2021-08-24 DIAGNOSIS — Z20822 Contact with and (suspected) exposure to covid-19: Secondary | ICD-10-CM | POA: Diagnosis not present

## 2021-08-27 ENCOUNTER — Telehealth: Payer: Self-pay | Admitting: Cardiovascular Disease

## 2021-08-27 DIAGNOSIS — I480 Paroxysmal atrial fibrillation: Secondary | ICD-10-CM

## 2021-08-27 MED ORDER — APIXABAN 5 MG PO TABS: 5.0000 mg | ORAL_TABLET | Freq: Two times a day (BID) | ORAL | 6 refills | Status: DC

## 2021-08-27 NOTE — Telephone Encounter (Signed)
°*  STAT* If patient is at the pharmacy, call can be transferred to refill team.   1. Which medications need to be refilled? (please list name of each medication and dose if known) Eliquis  2. Which pharmacy/location (including street and city if local pharmacy) is medication to be sent to?[facility]. [place]  3. Do they need a 30 day or 90 day supply? 90 days and refills

## 2021-08-27 NOTE — Telephone Encounter (Signed)
Eliquis 5 mg refill request received. Patient is 88 years old, weight- 61.5 kg, Crea- 1.2 on 07/19/21, Diagnosis- PAF, and last seen by Dr. Acie Fredrickson on 07/19/21. Dose is appropriate based on dosing criteria. Will send in refill to requested pharmacy.

## 2021-08-31 DIAGNOSIS — L814 Other melanin hyperpigmentation: Secondary | ICD-10-CM | POA: Diagnosis not present

## 2021-08-31 DIAGNOSIS — L820 Inflamed seborrheic keratosis: Secondary | ICD-10-CM | POA: Diagnosis not present

## 2021-08-31 DIAGNOSIS — L853 Xerosis cutis: Secondary | ICD-10-CM | POA: Diagnosis not present

## 2021-08-31 DIAGNOSIS — L570 Actinic keratosis: Secondary | ICD-10-CM | POA: Diagnosis not present

## 2021-08-31 DIAGNOSIS — L821 Other seborrheic keratosis: Secondary | ICD-10-CM | POA: Diagnosis not present

## 2021-11-02 DIAGNOSIS — E559 Vitamin D deficiency, unspecified: Secondary | ICD-10-CM | POA: Diagnosis not present

## 2021-11-02 DIAGNOSIS — M810 Age-related osteoporosis without current pathological fracture: Secondary | ICD-10-CM | POA: Diagnosis not present

## 2021-11-03 DIAGNOSIS — D6869 Other thrombophilia: Secondary | ICD-10-CM | POA: Diagnosis not present

## 2021-11-03 DIAGNOSIS — L309 Dermatitis, unspecified: Secondary | ICD-10-CM | POA: Diagnosis not present

## 2021-11-03 DIAGNOSIS — I2510 Atherosclerotic heart disease of native coronary artery without angina pectoris: Secondary | ICD-10-CM | POA: Diagnosis not present

## 2021-11-03 DIAGNOSIS — M1990 Unspecified osteoarthritis, unspecified site: Secondary | ICD-10-CM | POA: Diagnosis not present

## 2021-11-03 DIAGNOSIS — I4891 Unspecified atrial fibrillation: Secondary | ICD-10-CM | POA: Diagnosis not present

## 2021-11-03 DIAGNOSIS — E261 Secondary hyperaldosteronism: Secondary | ICD-10-CM | POA: Diagnosis not present

## 2021-11-03 DIAGNOSIS — Z008 Encounter for other general examination: Secondary | ICD-10-CM | POA: Diagnosis not present

## 2021-11-03 DIAGNOSIS — E785 Hyperlipidemia, unspecified: Secondary | ICD-10-CM | POA: Diagnosis not present

## 2021-11-03 DIAGNOSIS — I509 Heart failure, unspecified: Secondary | ICD-10-CM | POA: Diagnosis not present

## 2021-11-03 DIAGNOSIS — K5900 Constipation, unspecified: Secondary | ICD-10-CM | POA: Diagnosis not present

## 2021-11-03 DIAGNOSIS — N3941 Urge incontinence: Secondary | ICD-10-CM | POA: Diagnosis not present

## 2021-11-03 DIAGNOSIS — I110 Hypertensive heart disease with heart failure: Secondary | ICD-10-CM | POA: Diagnosis not present

## 2021-11-03 DIAGNOSIS — M810 Age-related osteoporosis without current pathological fracture: Secondary | ICD-10-CM | POA: Diagnosis not present

## 2021-11-23 ENCOUNTER — Other Ambulatory Visit: Payer: Self-pay | Admitting: Cardiovascular Disease

## 2021-11-23 DIAGNOSIS — I480 Paroxysmal atrial fibrillation: Secondary | ICD-10-CM

## 2021-11-23 NOTE — Telephone Encounter (Signed)
Age 88, weight 61.5kg, SCr 1.2 on 07/19/21, last OV 07/2021, afib indication ?

## 2021-11-29 ENCOUNTER — Other Ambulatory Visit: Payer: Self-pay | Admitting: Cardiovascular Disease

## 2022-02-20 ENCOUNTER — Encounter: Payer: Self-pay | Admitting: Cardiovascular Disease

## 2022-02-20 NOTE — Progress Notes (Unsigned)
Donna Ortega Date of Birth  1926/06/18 [facility]      [address]    [address]    [place], [place]  [address]      Problems:  1. CAD - s/p CABG 2007, 2. PACs 3. Drug Rash - amiodarone 4. GI Bleed 5. Anemia 6. hyponatremia  Previous notes:   Donna Ortega is an 88 year old female with a history of coronary artery disease and atrial fibrillation. She was admitted to the hospital in December with a GI bleed, anemia, and rapid atrial fibrillation. She was discharged with the additional medications of amiodarone and Cardizem CD.  She was seen in mid January and was doing fairly well.  She stopped her Amiodarone due to drug rash.  She still has generalized fatigue and generalized weakness. She wonders if it may be due to the Lasix and potassium.  April 17, 2012 She has been more fatigued recently.  She played golf this past weekend and still is not over the exertion.  Dec. 3, 2013 :  She is doing well.  Dr. Caryl Comes stopped her Pradaxa due to GI bleed.    She is doing very well.   January 15, 2013:  Donna Ortega is doing OK.  She does not have as much energy as she would like.   She has continued to have problems with hyponatremia.  She is on maxzide which is likely exacerbating this.  No CP, no dyspnea.    Dec. 1, 2014:  Donna Ortega is doing well.  No CP or dyspnea.  Rhythm is stable.   Her Hb was a bit low when last checked by her medical doctor.  Has not started on the iron tabs.   No CP, not playing golf any more because of back pain ( s/p kyphoplasty)  .  She is still walking around the block on occasion.   She is now in her own apartment.   Sept. 9, 2015:  Donna Ortega is doing well.  She is not having any problems   March 02, 2015: No CP , no dyspnea.   Feb. 13, 2017:  Fell 3 weeks ago .  Now is using a cane for stability  She has occasional episodes of atrial fib - last 5 minutes or so.   Is on Eliquis 2.5 BID  Has had some bladder issues   Oct. 6, 2017:  Donna Ortega is seen back for  her CAD and PAF  Has some leg swelling .  Was on Maxzide - this is off her list - perhaps because of hypotension . Lisinopril was stopped at that time    November 29, 2016:  Doing well, no angina  Knows that she is starting to forget things on occasion Has been having some swelling in her feet and ankles.  BP has been a bit higher She avoids salty foods.   Nov. 21, 2018:  Doing well.  No CP or dyspnea Had kyphoplasty this past summer.  Very active.    Still driving    Jan 02, 8314:  [name] is seen today for follow-up of her coronary artery disease and hx atrial fibrillation. Still driving - not driving to [facility] anymore  No CP or dyspnea   Oct. 25, 2021 Donna Ortega is seen today after a 2 year absence.  She was seen as a virtual visit last year. Hx of CAD, atrial fib Hx of anemia  She has developed some dementia She has been having some issues with leg  edema  intermittant shortness of breath   October 30, 2020: Donna Ortega is seen for follow up .   Seen with her son  Donna Ortega) today  Hx of CAD, atrial fib, anemia,  Moderate dementia  Leg edema Is walking regularly  Lives at [facility].   Dec. 5, 2022: Donna Ortega is seen for follow up of her CAD , atrial fib, anemia  Moderate dementia  Feels well.  Lives at [facility] with others  Is in the walking club.   Stays active    February 22, 2022 Donna Ortega is seen for follow up of her CAD, atrial fib, anemia Has progressive dementia  No cp or dyspnea Gets fatigued easier . Still does the exercise classes Minimal swelling    Current Outpatient Medications on File Prior to Visit  Medication Sig Dispense Refill   Ascorbic Acid (VITAMIN C) 500 MG CAPS Take 1 tablet by mouth daily. With rose hips     diltiazem (CARDIZEM CD) 360 MG 24 hr capsule TAKE ONE CAPSULE BY MOUTH DAILY. Please schedule appointment for follow up in June 2023. 90 capsule 0   diphenhydramine-acetaminophen (TYLENOL PM) 25-500 MG TABS tablet Take 2 tablets by mouth  at bedtime.     ELIQUIS 5 MG TABS tablet TAKE ONE TABLET BY MOUTH TWICE A DAY 60 tablet 5   potassium chloride (KLOR-CON) 10 MEQ tablet TAKE ONE TABLET BY MOUTH DAILY 90 tablet 1   PROLIA 60 MG/ML SOSY injection Inject into the skin once.     rosuvastatin (CRESTOR) 10 MG tablet TAKE ONE TABLET BY MOUTH DAILY 90 tablet 1   torsemide (DEMADEX) 20 MG tablet TAKE ONE TABLET BY MOUTH DAILY 90 tablet 1   TYMLOS 3120 MCG/1.56ML SOPN Inject as directed daily.     doxycycline (VIBRAMYCIN) 100 MG capsule Take 1 capsule (100 mg total) by mouth 2 (two) times daily. (Patient not taking: Reported on 02/22/2022) 20 capsule 0   No current facility-administered medications on file prior to visit.    Allergies  Allergen Reactions   Amiodarone Hives and Other (See Comments)    ALL OVER BODY HIVES/ ITCH  other   Aspirin Other (See Comments)    Gi bleed  other   Atorvastatin Other (See Comments)    MUSCLE ACHES other   Lipitor [Atorvastatin Calcium] Other (See Comments)    MUSCLE ACHES   Nsaids Other (See Comments)    GI bleed   Tolmetin Other (See Comments)    GI bleed other   Naproxen Other (See Comments)   Niacin Rash and Other (See Comments)    other   Niaspan [Niacin Er] Rash    Past Medical History:  Diagnosis Date   Arthritis    osteo   in back   Coronary artery disease 2007   3 vessel CABG with LIMA-LAD, SVG to diag, and SVG - Ramus   Diverticulitis    GI bleeding 09/09/2013   Hearing loss of left ear 07/2013   History of atrial fibrillation; review of all ECG are most consistent with atrial tachycardia    had rash with amiodarone; no anticoagulation due to GI bleed in December 2012   History of GI bleed Dec 2012   History of hyperkalemia    Hyperlipidemia    Hypertension    Hyponatremia    Pacemaker-Medtronic 02/09/2012   Palpitations     Past Surgical History:  Procedure Laterality Date   ABDOMINAL HYSTERECTOMY     COLONOSCOPY N/A 09/12/2013   Procedure: COLONOSCOPY;  Surgeon: Wonda Horner, MD;  Location: [facility] ENDOSCOPY;  Service: Endoscopy;  Laterality: N/A;   CORONARY ARTERY BYPASS GRAFT  Feb 2007   Dr. Cyndia Bent;    ESOPHAGOGASTRODUODENOSCOPY  08/04/2011   Procedure: ESOPHAGOGASTRODUODENOSCOPY (EGD);  Surgeon: Cleotis Nipper, MD;  Location: [facility] ENDOSCOPY;  Service: Endoscopy;  Laterality: N/A;  do at bedside   ESOPHAGOGASTRODUODENOSCOPY N/A 09/11/2013   Procedure: ESOPHAGOGASTRODUODENOSCOPY (EGD);  Surgeon: Jeryl Columbia, MD;  Location: [facility] ENDOSCOPY;  Service: Endoscopy;  Laterality: N/A;   IR KYPHO LUMBAR INC FX REDUCE BONE BX UNI/BIL CANNULATION INC/IMAGING  07/16/2018   IR KYPHO THORACIC WITH BONE BIOPSY  04/11/2017   IR PATIENT EVAL TECH 0-60 MINS  07/13/2018   IR RADIOLOGIST EVAL & MGMT  04/04/2017   MAZE     pace maker     PARTIAL HYSTERECTOMY     PERMANENT PACEMAKER INSERTION N/A 02/08/2012   Procedure: PERMANENT PACEMAKER INSERTION;  Surgeon: Deboraha Sprang, MD;  Location[facility] CATH LAB;  Service: Cardiovascular;  Laterality: N/A;    Social History   Tobacco Use  Smoking Status Never  Smokeless Tobacco Never    Social History   Substance and Sexual Activity  Alcohol Use No    Family History  Problem Relation Age of Onset   Heart failure Father    Kidney failure Father    Bone cancer Brother    Kidney failure Sister    Colon cancer Sister    Cancer Sister     Reviw of Systems:  Reviewed in the HPI.  All other systems are negative.   Physical Exam: Blood pressure 128/66, pulse 80, height '5\' 4"'$  (1.626 m), weight 136 lb 9.6 oz (62 kg), SpO2 96 %.  GEN:  Well nourished elderly female , well developed in no acute distress HEENT: Normal NECK: No JVD; No carotid bruits LYMPHATICS: No lymphadenopathy CARDIAC: RRR , no murmurs, rubs, gallops RESPIRATORY:  Clear to auscultation without rales, wheezing or rhonchi  ABDOMEN: Soft, non-tender, non-distended MUSCULOSKELETAL:  No edema; No deformity  SKIN: Warm and dry NEUROLOGIC:  Alert  and oriented x 3   ECG:     Assessment / Plan:   1. CAD - s/p CABG 2007,    no angina.    2. Atrial fib:  - is A pacing today  maintaining NSR .  Cont eliquis    3.  Leg edema:   taking torsemide and potassium QOD now,  takes daily for worsening leg edema   4. GI Bleed  -      5. Anemia -   Stable   6.  Fatigue:    7. Hyperlipidemia -  back on atorvastatin .  Check lipids look good     8. HTN:  .  Blood pressure looks good.  9.  Vit D deficiency :   will check Vit D today     Mertie Moores, MD  02/22/2022 8:58 AM    [address],  [place], [place]  [address] Pager [phone] Phone: [phone]; Fax: [phone]

## 2022-02-22 ENCOUNTER — Encounter: Payer: Self-pay | Admitting: Cardiovascular Disease

## 2022-02-22 ENCOUNTER — Ambulatory Visit: Payer: Medicare HMO | Admitting: Cardiovascular Disease

## 2022-02-22 VITALS — BP 128/66 | HR 80 | Ht 64.0 in | Wt 136.6 lb

## 2022-02-22 DIAGNOSIS — R5383 Other fatigue: Secondary | ICD-10-CM | POA: Diagnosis not present

## 2022-02-22 DIAGNOSIS — R7989 Other specified abnormal findings of blood chemistry: Secondary | ICD-10-CM

## 2022-02-22 DIAGNOSIS — I480 Paroxysmal atrial fibrillation: Secondary | ICD-10-CM

## 2022-02-22 DIAGNOSIS — I2510 Atherosclerotic heart disease of native coronary artery without angina pectoris: Secondary | ICD-10-CM | POA: Diagnosis not present

## 2022-02-22 NOTE — Patient Instructions (Addendum)
Medication Instructions:  Your physician recommends that you continue on your current medications as directed. Please refer to the Current Medication list given to you today.  *If you need a refill on your cardiac medications before your next appointment, please call your pharmacy*   Lab Work: BMET, LIPIDS, ALT, Vit D level today If you have labs (blood work) drawn today and your tests are completely normal, you will receive your results only by: [place] (if you have MyChart) OR A paper copy in the mail If you have any lab test that is abnormal or we need to change your treatment, we will call you to review the results.   Testing/Procedures: NONE   Follow-Up: At [facility], you and your health needs are our priority.  As part of our continuing mission to provide you with exceptional heart care, we have created designated Provider Care Teams.  These Care Teams include your primary Cardiologist (physician) and Advanced Practice Providers (APPs -  Physician Assistants and Nurse Practitioners) who all work together to provide you with the care you need, when you need it.  Your next appointment:   6 months  The format for your next appointment:   In Person  Provider:   Ronn Melena, or Nahser {    Important Information About Sugar

## 2022-02-23 LAB — LIPID PANEL
Chol/HDL Ratio: 2.1 ratio (ref 0.0–4.4)
Cholesterol, Total: 208 mg/dL — ABNORMAL HIGH (ref 100–199)
HDL: 101 mg/dL (ref >39)
LDL Chol Calc (NIH): 96 mg/dL (ref 0–99)
Triglycerides: 62 mg/dL (ref 0–149)
VLDL Cholesterol Cal: 11 mg/dL (ref 5–40)

## 2022-02-23 LAB — BASIC METABOLIC PANEL
BUN/Creatinine Ratio: 22 (ref 12–28)
BUN: 25 mg/dL (ref 10–36)
CO2: 23 mmol/L (ref 20–29)
Calcium: 9.2 mg/dL (ref 8.7–10.3)
Chloride: 105 mmol/L (ref 96–106)
Creatinine, Ser: 1.16 mg/dL — ABNORMAL HIGH (ref 0.57–1.00)
Glucose: 134 mg/dL — ABNORMAL HIGH (ref 70–99)
Potassium: 4.1 mmol/L (ref 3.5–5.2)
Sodium: 141 mmol/L (ref 134–144)
eGFR: 44 mL/min/{1.73_m2} — ABNORMAL LOW (ref >59)

## 2022-02-23 LAB — VITAMIN D 25 HYDROXY (VIT D DEFICIENCY, FRACTURES): Vit D, 25-Hydroxy: 37.8 ng/mL (ref 30.0–100.0)

## 2022-02-23 LAB — ALT: ALT: 5 IU/L (ref 0–32)

## 2022-02-24 ENCOUNTER — Other Ambulatory Visit: Payer: Self-pay | Admitting: Cardiovascular Disease

## 2022-03-26 ENCOUNTER — Other Ambulatory Visit: Payer: Self-pay | Admitting: Cardiovascular Disease

## 2022-03-26 DIAGNOSIS — I480 Paroxysmal atrial fibrillation: Secondary | ICD-10-CM

## 2022-03-28 NOTE — Telephone Encounter (Signed)
Prescription refill request for Eliquis received. Indication:Afib Last office visit:7/23 Scr:1.1 Age: 88 Weight:62 kg  Prescription refilled

## 2022-03-30 DIAGNOSIS — U071 COVID-19: Secondary | ICD-10-CM | POA: Diagnosis not present

## 2022-03-30 DIAGNOSIS — J00 Acute nasopharyngitis [common cold]: Secondary | ICD-10-CM | POA: Diagnosis not present

## 2022-05-23 ENCOUNTER — Other Ambulatory Visit: Payer: Self-pay | Admitting: Cardiovascular Disease

## 2022-05-26 DIAGNOSIS — E559 Vitamin D deficiency, unspecified: Secondary | ICD-10-CM | POA: Diagnosis not present

## 2022-05-26 DIAGNOSIS — M810 Age-related osteoporosis without current pathological fracture: Secondary | ICD-10-CM | POA: Diagnosis not present

## 2022-06-03 ENCOUNTER — Other Ambulatory Visit: Payer: Self-pay | Admitting: Cardiovascular Disease

## 2022-08-08 ENCOUNTER — Encounter (HOSPITAL_COMMUNITY): Payer: Self-pay

## 2022-08-08 ENCOUNTER — Inpatient Hospital Stay (HOSPITAL_COMMUNITY)
Admission: EM | Admit: 2022-08-08 | Discharge: 2022-08-16 | DRG: 202 | Disposition: A | Discharge status: Home Health | Payer: Medicare HMO | Attending: Internal Medicine | Admitting: Internal Medicine

## 2022-08-08 ENCOUNTER — Emergency Department (HOSPITAL_COMMUNITY): Payer: Medicare HMO

## 2022-08-08 ENCOUNTER — Other Ambulatory Visit: Payer: Self-pay

## 2022-08-08 DIAGNOSIS — Z7901 Long term (current) use of anticoagulants: Secondary | ICD-10-CM

## 2022-08-08 DIAGNOSIS — G928 Other toxic encephalopathy: Secondary | ICD-10-CM | POA: Diagnosis not present

## 2022-08-08 DIAGNOSIS — Z79899 Other long term (current) drug therapy: Secondary | ICD-10-CM

## 2022-08-08 DIAGNOSIS — M25462 Effusion, left knee: Secondary | ICD-10-CM | POA: Diagnosis not present

## 2022-08-08 DIAGNOSIS — I2510 Atherosclerotic heart disease of native coronary artery without angina pectoris: Secondary | ICD-10-CM | POA: Diagnosis not present

## 2022-08-08 DIAGNOSIS — Z951 Presence of aortocoronary bypass graft: Secondary | ICD-10-CM

## 2022-08-08 DIAGNOSIS — M25562 Pain in left knee: Secondary | ICD-10-CM | POA: Diagnosis not present

## 2022-08-08 DIAGNOSIS — Z800 Family history of malignant neoplasm of digestive organs: Secondary | ICD-10-CM | POA: Diagnosis not present

## 2022-08-08 DIAGNOSIS — Z886 Allergy status to analgesic agent status: Secondary | ICD-10-CM

## 2022-08-08 DIAGNOSIS — Z950 Presence of cardiac pacemaker: Secondary | ICD-10-CM | POA: Diagnosis present

## 2022-08-08 DIAGNOSIS — Z7952 Long term (current) use of systemic steroids: Secondary | ICD-10-CM

## 2022-08-08 DIAGNOSIS — R0603 Acute respiratory distress: Secondary | ICD-10-CM | POA: Diagnosis not present

## 2022-08-08 DIAGNOSIS — I1310 Hypertensive heart and chronic kidney disease without heart failure, with stage 1 through stage 4 chronic kidney disease, or unspecified chronic kidney disease: Secondary | ICD-10-CM | POA: Diagnosis present

## 2022-08-08 DIAGNOSIS — J189 Pneumonia, unspecified organism: Secondary | ICD-10-CM | POA: Diagnosis not present

## 2022-08-08 DIAGNOSIS — R059 Cough, unspecified: Secondary | ICD-10-CM | POA: Diagnosis not present

## 2022-08-08 DIAGNOSIS — J121 Respiratory syncytial virus pneumonia: Principal | ICD-10-CM

## 2022-08-08 DIAGNOSIS — M1712 Unilateral primary osteoarthritis, left knee: Secondary | ICD-10-CM | POA: Diagnosis not present

## 2022-08-08 DIAGNOSIS — Z841 Family history of disorders of kidney and ureter: Secondary | ICD-10-CM

## 2022-08-08 DIAGNOSIS — N1832 Chronic kidney disease, stage 3b: Secondary | ICD-10-CM | POA: Diagnosis not present

## 2022-08-08 DIAGNOSIS — Z1152 Encounter for screening for COVID-19: Secondary | ICD-10-CM

## 2022-08-08 DIAGNOSIS — R52 Pain, unspecified: Secondary | ICD-10-CM | POA: Diagnosis not present

## 2022-08-08 DIAGNOSIS — I482 Chronic atrial fibrillation, unspecified: Secondary | ICD-10-CM | POA: Diagnosis present

## 2022-08-08 DIAGNOSIS — Z8249 Family history of ischemic heart disease and other diseases of the circulatory system: Secondary | ICD-10-CM | POA: Diagnosis not present

## 2022-08-08 DIAGNOSIS — R0602 Shortness of breath: Secondary | ICD-10-CM | POA: Diagnosis not present

## 2022-08-08 DIAGNOSIS — H9192 Unspecified hearing loss, left ear: Secondary | ICD-10-CM | POA: Diagnosis not present

## 2022-08-08 DIAGNOSIS — Z888 Allergy status to other drugs, medicaments and biological substances status: Secondary | ICD-10-CM | POA: Diagnosis not present

## 2022-08-08 DIAGNOSIS — H9190 Unspecified hearing loss, unspecified ear: Secondary | ICD-10-CM

## 2022-08-08 DIAGNOSIS — E785 Hyperlipidemia, unspecified: Secondary | ICD-10-CM | POA: Diagnosis not present

## 2022-08-08 DIAGNOSIS — I10 Essential (primary) hypertension: Secondary | ICD-10-CM | POA: Diagnosis present

## 2022-08-08 DIAGNOSIS — J210 Acute bronchiolitis due to respiratory syncytial virus: Principal | ICD-10-CM

## 2022-08-08 DIAGNOSIS — I5031 Acute diastolic (congestive) heart failure: Secondary | ICD-10-CM | POA: Diagnosis not present

## 2022-08-08 LAB — COMPREHENSIVE METABOLIC PANEL
ALT: 14 U/L (ref 0–44)
AST: 28 U/L (ref 15–41)
Albumin: 4 g/dL (ref 3.5–5.0)
Alkaline Phosphatase: 59 U/L (ref 38–126)
Anion gap: 13 (ref 5–15)
BUN: 30 mg/dL — ABNORMAL HIGH (ref 8–23)
CO2: 20 mmol/L — ABNORMAL LOW (ref 22–32)
Calcium: 8.7 mg/dL — ABNORMAL LOW (ref 8.9–10.3)
Chloride: 108 mmol/L (ref 98–111)
Creatinine, Ser: 1.16 mg/dL — ABNORMAL HIGH (ref 0.44–1.00)
GFR, Estimated: 44 mL/min — ABNORMAL LOW (ref >60)
Glucose, Bld: 166 mg/dL — ABNORMAL HIGH (ref 70–99)
Potassium: 4.6 mmol/L (ref 3.5–5.1)
Sodium: 141 mmol/L (ref 135–145)
Total Bilirubin: 1 mg/dL (ref 0.3–1.2)
Total Protein: 7.5 g/dL (ref 6.5–8.1)

## 2022-08-08 LAB — URINALYSIS, ROUTINE W REFLEX MICROSCOPIC
Bilirubin Urine: NEGATIVE
Glucose, UA: NEGATIVE mg/dL
Ketones, ur: NEGATIVE mg/dL
Nitrite: NEGATIVE
Protein, ur: NEGATIVE mg/dL
Specific Gravity, Urine: 1.02 (ref 1.005–1.030)
pH: 5.5 (ref 5.0–8.0)

## 2022-08-08 LAB — CBC WITH DIFFERENTIAL/PLATELET
Abs Immature Granulocytes: 0.02 10*3/uL (ref 0.00–0.07)
Basophils Absolute: 0 10*3/uL (ref 0.0–0.1)
Basophils Relative: 0 %
Eosinophils Absolute: 0 10*3/uL (ref 0.0–0.5)
Eosinophils Relative: 0 %
HCT: 38.7 % (ref 36.0–46.0)
Hemoglobin: 12.3 g/dL (ref 12.0–15.0)
Immature Granulocytes: 0 %
Lymphocytes Relative: 7 %
Lymphs Abs: 0.5 10*3/uL — ABNORMAL LOW (ref 0.7–4.0)
MCH: 32 pg (ref 26.0–34.0)
MCHC: 31.8 g/dL (ref 30.0–36.0)
MCV: 100.8 fL — ABNORMAL HIGH (ref 80.0–100.0)
Monocytes Absolute: 0.7 10*3/uL (ref 0.1–1.0)
Monocytes Relative: 9 %
Neutro Abs: 6.2 10*3/uL (ref 1.7–7.7)
Neutrophils Relative %: 84 %
Platelets: 141 10*3/uL — ABNORMAL LOW (ref 150–400)
RBC: 3.84 MIL/uL — ABNORMAL LOW (ref 3.87–5.11)
RDW: 13.1 % (ref 11.5–15.5)
WBC: 7.5 10*3/uL (ref 4.0–10.5)
nRBC: 0 % (ref 0.0–0.2)

## 2022-08-08 LAB — RESP PANEL BY RT-PCR (RSV, FLU A&B, COVID)  RVPGX2
Influenza A by PCR: NEGATIVE
Influenza B by PCR: NEGATIVE
Resp Syncytial Virus by PCR: POSITIVE — AB
SARS Coronavirus 2 by RT PCR: NEGATIVE

## 2022-08-08 LAB — TROPONIN I (HIGH SENSITIVITY)
Troponin I (High Sensitivity): 13 ng/L (ref <18)
Troponin I (High Sensitivity): 17 ng/L (ref <18)

## 2022-08-08 LAB — LACTIC ACID, PLASMA: Lactic Acid, Venous: 1.5 mmol/L (ref 0.5–1.9)

## 2022-08-08 LAB — I-STAT CHEM 8, ED
BUN: 38 mg/dL — ABNORMAL HIGH (ref 8–23)
Calcium, Ion: 1.06 mmol/L — ABNORMAL LOW (ref 1.15–1.40)
Chloride: 105 mmol/L (ref 98–111)
Creatinine, Ser: 1.2 mg/dL — ABNORMAL HIGH (ref 0.44–1.00)
Glucose, Bld: 167 mg/dL — ABNORMAL HIGH (ref 70–99)
HCT: 37 % (ref 36.0–46.0)
Hemoglobin: 12.6 g/dL (ref 12.0–15.0)
Potassium: 4.6 mmol/L (ref 3.5–5.1)
Sodium: 139 mmol/L (ref 135–145)
TCO2: 24 mmol/L (ref 22–32)

## 2022-08-08 LAB — PROTIME-INR
INR: 1.4 — ABNORMAL HIGH (ref 0.8–1.2)
Prothrombin Time: 17.1 seconds — ABNORMAL HIGH (ref 11.4–15.2)

## 2022-08-08 LAB — APTT: aPTT: 31 seconds (ref 24–36)

## 2022-08-08 LAB — BRAIN NATRIURETIC PEPTIDE: B Natriuretic Peptide: 287.2 pg/mL — ABNORMAL HIGH (ref 0.0–100.0)

## 2022-08-08 MED ORDER — DILTIAZEM HCL 25 MG/5ML IV SOLN
10.0000 mg | Freq: Once | INTRAVENOUS | Status: AC
  Administered 2022-08-08: 10 mg via INTRAVENOUS
  Filled 2022-08-08: qty 5

## 2022-08-08 MED ORDER — APIXABAN 5 MG PO TABS
5.0000 mg | ORAL_TABLET | Freq: Two times a day (BID) | ORAL | Status: DC
  Administered 2022-08-08 – 2022-08-16 (×16): 5 mg via ORAL
  Filled 2022-08-08 (×16): qty 1

## 2022-08-08 MED ORDER — DILTIAZEM HCL ER COATED BEADS 120 MG PO CP24
360.0000 mg | ORAL_CAPSULE | Freq: Every day | ORAL | Status: DC
  Administered 2022-08-09 – 2022-08-16 (×8): 360 mg via ORAL
  Filled 2022-08-08 (×8): qty 3

## 2022-08-08 MED ORDER — DM-GUAIFENESIN ER 30-600 MG PO TB12
1.0000 | ORAL_TABLET | Freq: Two times a day (BID) | ORAL | Status: DC
  Administered 2022-08-08 – 2022-08-16 (×16): 1 via ORAL
  Filled 2022-08-08 (×16): qty 1

## 2022-08-08 MED ORDER — SODIUM CHLORIDE 0.9 % IV SOLN
2.0000 g | INTRAVENOUS | Status: AC
  Administered 2022-08-08 – 2022-08-12 (×5): 2 g via INTRAVENOUS
  Filled 2022-08-08 (×5): qty 20

## 2022-08-08 MED ORDER — LACTATED RINGERS IV SOLN: INTRAVENOUS | Status: DC

## 2022-08-08 MED ORDER — METOPROLOL TARTRATE 5 MG/5ML IV SOLN
5.0000 mg | INTRAVENOUS | Status: DC | PRN
  Administered 2022-08-08: 5 mg via INTRAVENOUS
  Filled 2022-08-08: qty 5

## 2022-08-08 MED ORDER — ONDANSETRON HCL 4 MG PO TABS: 4.0000 mg | ORAL_TABLET | Freq: Four times a day (QID) | ORAL | Status: DC | PRN

## 2022-08-08 MED ORDER — LACTATED RINGERS IV BOLUS (SEPSIS)
1000.0000 mL | Freq: Once | INTRAVENOUS | Status: AC
  Administered 2022-08-08: 1000 mL via INTRAVENOUS

## 2022-08-08 MED ORDER — DIPHENHYDRAMINE HCL 25 MG PO CAPS
25.0000 mg | ORAL_CAPSULE | Freq: Every evening | ORAL | Status: DC | PRN
  Administered 2022-08-08 – 2022-08-12 (×4): 25 mg via ORAL
  Filled 2022-08-08 (×4): qty 1

## 2022-08-08 MED ORDER — SODIUM CHLORIDE 0.9 % IV SOLN
500.0000 mg | INTRAVENOUS | Status: AC
  Administered 2022-08-08 – 2022-08-12 (×5): 500 mg via INTRAVENOUS
  Filled 2022-08-08 (×5): qty 5

## 2022-08-08 MED ORDER — HYDROCOD POLI-CHLORPHE POLI ER 10-8 MG/5ML PO SUER
5.0000 mL | Freq: Two times a day (BID) | ORAL | Status: DC
  Administered 2022-08-08 – 2022-08-16 (×16): 5 mL via ORAL
  Filled 2022-08-08 (×16): qty 5

## 2022-08-08 MED ORDER — ACETAMINOPHEN 325 MG PO TABS
650.0000 mg | ORAL_TABLET | Freq: Four times a day (QID) | ORAL | Status: DC | PRN
  Administered 2022-08-11: 650 mg via ORAL
  Filled 2022-08-08: qty 2

## 2022-08-08 MED ORDER — TORSEMIDE 20 MG PO TABS: 20.0000 mg | ORAL_TABLET | Freq: Every day | ORAL | Status: DC

## 2022-08-08 MED ORDER — ONDANSETRON HCL 4 MG/2ML IJ SOLN: 4.0000 mg | Freq: Four times a day (QID) | INTRAMUSCULAR | Status: DC | PRN

## 2022-08-08 MED ORDER — ACETAMINOPHEN 650 MG RE SUPP: 650.0000 mg | Freq: Four times a day (QID) | RECTAL | Status: DC | PRN

## 2022-08-08 MED ORDER — RIVAROXABAN 10 MG PO TABS: 10.0000 mg | ORAL_TABLET | Freq: Every day | ORAL | Status: DC

## 2022-08-08 NOTE — Plan of Care (Signed)

## 2022-08-08 NOTE — Progress Notes (Signed)
Connected patient to bedside continuous pulse ox monitor. 

## 2022-08-08 NOTE — Progress Notes (Signed)
   08/08/22 2135  Assess: MEWS Score  Temp 98.4 F (36.9 C)  BP (!) 161/94  MAP (mmHg) 112  Pulse Rate (!) 119 (RN notified)  Resp 18  SpO2 97 %  O2 Device Nasal Cannula  O2 Flow Rate (L/min) 2 L/min  Assess: MEWS Score  MEWS Temp 0  MEWS Systolic 0  MEWS Pulse 2  MEWS RR 0  MEWS LOC 0  MEWS Score 2  MEWS Score Color Yellow  Assess: if the MEWS score is Yellow or Red  Were vital signs taken at a resting state? Yes  Focused Assessment No change from prior assessment  Does the patient meet 2 or more of the SIRS criteria? Yes  Does the patient have a confirmed or suspected source of infection? No  MEWS guidelines implemented *See Row Information* Yes  Treat  MEWS Interventions Administered prn meds/treatments  Take Vital Signs  Increase Vital Sign Frequency  Yellow: Q 2hr X 2 then Q 4hr X 2, if remains yellow, continue Q 4hrs  Escalate  MEWS: Escalate Yellow: discuss with charge nurse/RN and consider discussing with provider and RRT  Notify: Charge Nurse/RN  Name of Charge Nurse/RN Notified Tom RN  Date Charge Nurse/RN Notified 08/08/22  Time Charge Nurse/RN Notified 2200  Provider Notification  Provider Name/Title Olena Heckle NP  Date Provider Notified 08/08/22  Time Provider Notified 2200  Method of Notification Page  Provider response No new orders  Document  Patient Outcome Other (Comment) (Remains on unit)  Progress note created (see row info) Yes  Assess: SIRS CRITERIA  SIRS Temperature  0  SIRS Pulse 1  SIRS Respirations  0  SIRS WBC 0  SIRS Score Sum  1

## 2022-08-08 NOTE — ED Provider Notes (Signed)
[place] DEPT Provider Note   CSN: [id number] Arrival date & time: 08/08/22  1319     History  Chief Complaint  Patient presents with   Shortness of Breath   Cough    Donna Ortega is a 88 y.o. female.  HPI     88 year old female with history of coronary disease, atrial fibrillation on Eliquis, hypertension, hyperlipidemia, who presents with concern for cough for 4 days and shortness of breath today.   Reports she has had nasal congestion, cough.  No nausea, vomiting, diarrhea or chest pain. Family reports she has had shortness of breath today, breathing fast, audibly rattling sounding breath sounds. Fever today suspected at home. No hx of lung disease   Discussed goals of care, she is DNR but they have not decided on intubation.    Past Medical History:  Diagnosis Date   Arthritis    osteo   in back   Coronary artery disease 2007   3 vessel CABG with LIMA-LAD, SVG to diag, and SVG - Ramus   Diverticulitis    GI bleeding 09/09/2013   Hearing loss of left ear 07/2013   History of atrial fibrillation; review of all ECG are most consistent with atrial tachycardia    had rash with amiodarone; no anticoagulation due to GI bleed in December 2012   History of GI bleed Dec 2012   History of hyperkalemia    Hyperlipidemia    Hypertension    Hyponatremia    Pacemaker-[employer] 02/09/2012   Palpitations     Home Medications Prior to Admission medications   Medication Sig Start Date End Date Taking? Authorizing Provider  Ascorbic Acid (VITAMIN C) 500 MG CAPS Take 1 tablet by mouth daily. With rose hips    [provider]  diltiazem (CARDIZEM CD) 360 MG 24 hr capsule TAKE ONE CAPSULE BY MOUTH DAILY. 02/24/22   Nahser, Wonda Cheng, MD  diphenhydramine-acetaminophen (TYLENOL PM) 25-500 MG TABS tablet Take 2 tablets by mouth at bedtime.    [provider]  doxycycline (VIBRAMYCIN) 100 MG capsule Take 1 capsule (100 mg total) by  mouth 2 (two) times daily. Patient not taking: Reported on 02/22/2022 01/03/21   Tacy Learn, PA-C  ELIQUIS 5 MG TABS tablet TAKE ONE TABLET BY MOUTH TWICE A DAY 03/28/22   Nahser, Wonda Cheng, MD  potassium chloride (KLOR-CON) 10 MEQ tablet TAKE ONE TABLET BY MOUTH DAILY 06/08/21   Nahser, Wonda Cheng, MD  PROLIA 60 MG/ML SOSY injection Inject into the skin once. 05/04/21   [provider]  rosuvastatin (CRESTOR) 10 MG tablet TAKE ONE TABLET BY MOUTH DAILY 06/03/22   Nahser, Wonda Cheng, MD  torsemide (DEMADEX) 20 MG tablet TAKE ONE TABLET BY MOUTH DAILY 05/23/22   Nahser, Wonda Cheng, MD  TYMLOS 3120 MCG/1.56ML SOPN Inject as directed daily. 11/14/18   [provider]      Allergies    Amiodarone, Aspirin, Atorvastatin, Lipitor [atorvastatin calcium], Nsaids, Tolmetin, Naproxen, Niacin, and Niaspan [niacin er]    Review of Systems   Review of Systems  Physical Exam Updated Vital Signs BP (!) 152/98   Pulse (!) 120   Temp 100.2 F (37.9 C)   Resp (!) 23   Ht 5' 4.5" (1.638 m)   Wt 60.8 kg   SpO2 98%   BMI 22.65 kg/m  Physical Exam Vitals and nursing note reviewed.  Constitutional:      General: She is not in acute distress.    Appearance:  She is well-developed. She is ill-appearing. She is not diaphoretic.  HENT:     Head: Normocephalic and atraumatic.  Eyes:     Conjunctiva/sclera: Conjunctivae normal.  Cardiovascular:     Rate and Rhythm: Regular rhythm. Tachycardia present.     Heart sounds: Normal heart sounds. No murmur heard.    No friction rub. No gallop.  Pulmonary:     Effort: Pulmonary effort is normal. Tachypnea present. No respiratory distress.     Breath sounds: Rhonchi (diffuse) present. No wheezing or rales.  Abdominal:     General: There is no distension.     Palpations: Abdomen is soft.     Tenderness: There is no abdominal tenderness. There is no guarding.  Musculoskeletal:        General: No tenderness.     Cervical back: Normal range of  motion.     Left lower leg: Left lower leg edema: no increase in baseline.  Skin:    General: Skin is warm and dry.     Findings: No erythema or rash.  Neurological:     Mental Status: She is alert and oriented to person, place, and time.     ED Results / Procedures / Treatments   Labs (all labs ordered are listed, but only abnormal results are displayed) Labs Reviewed  RESP PANEL BY RT-PCR (RSV, FLU A&B, COVID)  RVPGX2 - Abnormal; Notable for the following components:      Result Value   Resp Syncytial Virus by PCR POSITIVE (*)    All other components within normal limits  COMPREHENSIVE METABOLIC PANEL - Abnormal; Notable for the following components:   CO2 20 (*)    Glucose, Bld 166 (*)    BUN 30 (*)    Creatinine, Ser 1.16 (*)    Calcium 8.7 (*)    GFR, Estimated 44 (*)    All other components within normal limits  CBC WITH DIFFERENTIAL/PLATELET - Abnormal; Notable for the following components:   RBC 3.84 (*)    MCV 100.8 (*)    Platelets 141 (*)    Lymphs Abs 0.5 (*)    All other components within normal limits  PROTIME-INR - Abnormal; Notable for the following components:   Prothrombin Time 17.1 (*)    INR 1.4 (*)    All other components within normal limits  BRAIN NATRIURETIC PEPTIDE - Abnormal; Notable for the following components:   B Natriuretic Peptide 287.2 (*)    All other components within normal limits  I-STAT CHEM 8, ED - Abnormal; Notable for the following components:   BUN 38 (*)    Creatinine, Ser 1.20 (*)    Glucose, Bld 167 (*)    Calcium, Ion 1.06 (*)    All other components within normal limits  CULTURE, BLOOD (ROUTINE X 2)  CULTURE, BLOOD (ROUTINE X 2)  URINE CULTURE  LACTIC ACID, PLASMA  APTT  URINALYSIS, ROUTINE W REFLEX MICROSCOPIC  TROPONIN I (HIGH SENSITIVITY)  TROPONIN I (HIGH SENSITIVITY)    EKG EKG Interpretation  Date/Time:  Monday August 08 2022 13:31:45 EST Ventricular Rate:  125 PR Interval:    QRS Duration: 83 QT  Interval:  324 QTC Calculation: 468 R Axis:   50 Text Interpretation: Junctional tachycardia Low voltage, precordial leads Repolarization abnormality, prob rate related Since priori ECG, not clearly atrial fibrillation, new ST depressoins Confirmed by Gareth Morgan [phone]) on 08/08/2022 1:55:13 PM  Radiology DG Chest Portable 1 View  Result Date: 08/08/2022 CLINICAL DATA:  Possible sepsis,  cough EXAM: PORTABLE CHEST 1 VIEW COMPARISON:  01/03/2021 FINDINGS: Transverse diameter of heart is increased. Pacemaker battery is seen in left infraclavicular region. There is evidence of previous cardiac surgery. Lung fields are clear of any infiltrates or pulmonary edema. There is no pleural effusion or pneumothorax. IMPRESSION: There are no signs of pulmonary edema or focal pulmonary consolidation. Electronically Signed   By: Elmer Picker M.D.   On: 08/08/2022 14:32    Procedures Procedures    Medications Ordered in ED Medications  lactated ringers infusion ( Intravenous New Bag/Given 08/08/22 1433)  lactated ringers bolus 1,000 mL (1,000 mLs Intravenous New Bag/Given 08/08/22 1436)  cefTRIAXone (ROCEPHIN) 2 g in sodium chloride 0.9 % 100 mL IVPB (2 g Intravenous New Bag/Given 08/08/22 1421)  azithromycin (ZITHROMAX) 500 mg in sodium chloride 0.9 % 250 mL IVPB (500 mg Intravenous New Bag/Given 08/08/22 1434)  diltiazem (CARDIZEM) injection 10 mg (10 mg Intravenous Given 08/08/22 1501)    ED Course/ Medical Decision Making/ A&P                            88 year old female with history of coronary disease, atrial fibrillation on Eliquis, hypertension, hyperlipidemia, who presents with concern for cough for 4 days and shortness of breath today.   Differential diagnosis for dyspnea includes ACS, PE, COPD exacerbation, CHF exacerbation, anemia, pneumonia, viral etiology such as COVID 19 infection, metabolic abnormality.    Right to the emergency department with increased work of  breathing, tachypnea, tachycardia to 125.  Given tachycardia, elevated temperature 100.2, infectious symptoms, was given Rocephin and azithromycin for suspected community-acquired pneumonia as other testing is pending.  Given 1 L of fluid for possible sepsis.  She was placed on 2 L of oxygen for comfort in the setting of increased work of breathing, rhonchorous breath sounds bilaterally.  EKG was completed and personally evaluated interpreted by me shows a regular tachycardia, consistent with a junctional tachycardia.  Chest x-ray was done which showed no acute abnormalities.  BNP was 287 without previous, no significant clinical findings of CHF on exam at this time.  No hx of COPD/respiratory illness.  Low suspicion for PE given she is on eliquis.  Labs completed and personally eval and interpreted by me show no leukocytosis, no anemia, normal lactic acid, mild elevation in BUN to 30, creatinine to 1.16.  Troponin within normal limits and have low suspicion for ACS.     Given bolus of 10 mg of diltiazem, now heart rate on the monitor is ranging from the 80s to 110, with more irregularity consistent with atrial fibrillation.  COVID, flu and RSV testing were completed and positive for RSV.  Suspect she likely has an RSV pneumonia as etiology of symptoms. Given increased WOB, tachycardia, will admit for continued care.           Final Clinical Impression(s) / ED Diagnoses Final diagnoses:  RSV (respiratory syncytial virus pneumonia)    Rx / DC Orders ED Discharge Orders     None         Gareth Morgan, MD 08/08/22 1536

## 2022-08-08 NOTE — ED Triage Notes (Signed)
Patient c/o a cough x 4 days. Patient's son reports that he noted today that she had SOB.

## 2022-08-08 NOTE — H&P (Signed)
History and Physical    Donna Ortega  QMG:[id number]  DOB: May 05, 1926  DOA: 08/08/2022 PCP: Patient, No Pcp Per   Patient coming from: Home  Chief Complaint: Cough  HPI: Donna Ortega is a 88 y.o. female with medical history of atrial fibrillation, coronary artery disease status post CABG, pacemaker who presents to the hospital for 4 days of progressive cough.  Her cough is so severe now that she is very uncomfortable.  Cough is congested she has no significant shortness of breath.  She has no nausea vomiting or diarrhea.  No complaints of headaches or sore throat or sinus drainage.  ED Course: Found to be positive for RSV, heart rates in the low 100s-atrial fibrillation, pulse ox normal  Review of Systems:  All other systems reviewed and apart from HPI, are negative.  Past Medical History:  Diagnosis Date   Arthritis    osteo   in back   Coronary artery disease 2007   3 vessel CABG with LIMA-LAD, SVG to diag, and SVG - Ramus   Diverticulitis    GI bleeding 09/09/2013   Hearing loss of left ear 07/2013   History of atrial fibrillation; review of all ECG are most consistent with atrial tachycardia    had rash with amiodarone; no anticoagulation due to GI bleed in December 2012   History of GI bleed Dec 2012   History of hyperkalemia    Hyperlipidemia    Hypertension    Hyponatremia    Pacemaker-Medtronic 02/09/2012   Palpitations     Past Surgical History:  Procedure Laterality Date   ABDOMINAL HYSTERECTOMY     COLONOSCOPY N/A 09/12/2013   Procedure: COLONOSCOPY;  Surgeon: Wonda Horner, MD;  Location: [facility] ENDOSCOPY;  Service: Endoscopy;  Laterality: N/A;   CORONARY ARTERY BYPASS GRAFT  Feb 2007   Dr. Cyndia Bent;    ESOPHAGOGASTRODUODENOSCOPY  08/04/2011   Procedure: ESOPHAGOGASTRODUODENOSCOPY (EGD);  Surgeon: Cleotis Nipper, MD;  Location[facility] ENDOSCOPY;  Service: Endoscopy;  Laterality: N/A;  do at bedside   ESOPHAGOGASTRODUODENOSCOPY N/A 09/11/2013   Procedure:  ESOPHAGOGASTRODUODENOSCOPY (EGD);  Surgeon: Jeryl Columbia, MD;  Location: [facility] ENDOSCOPY;  Service: Endoscopy;  Laterality: N/A;   IR KYPHO LUMBAR INC FX REDUCE BONE BX UNI/BIL CANNULATION INC/IMAGING  07/16/2018   IR KYPHO THORACIC WITH BONE BIOPSY  04/11/2017   IR PATIENT EVAL TECH 0-60 MINS  07/13/2018   IR RADIOLOGIST EVAL & MGMT  04/04/2017   MAZE     pace maker     PARTIAL HYSTERECTOMY     PERMANENT PACEMAKER INSERTION N/A 02/08/2012   Procedure: PERMANENT PACEMAKER INSERTION;  Surgeon: Deboraha Sprang, MD;  Location: [facility] CATH LAB;  Service: Cardiovascular;  Laterality: N/A;    Social History:   reports that she has never smoked. She has never used smokeless tobacco. She reports that she does not drink alcohol and does not use drugs.  Allergies  Allergen Reactions   Amiodarone Hives and Other (See Comments)    ALL OVER BODY HIVES/ ITCH  other   Aspirin Other (See Comments)    Gi bleed  other   Atorvastatin Other (See Comments)    MUSCLE ACHES other   Lipitor [Atorvastatin Calcium] Other (See Comments)    MUSCLE ACHES   Nsaids Other (See Comments)    GI bleed   Tolmetin Other (See Comments)    GI bleed other   Naproxen Other (See Comments)   Niacin Rash and Other (See Comments)    other  Niaspan [Niacin Er] Rash    Family History  Problem Relation Age of Onset   Heart failure Father    Kidney failure Father    Bone cancer Brother    Kidney failure Sister    Colon cancer Sister    Cancer Sister      Prior to Admission medications   Medication Sig Start Date End Date Taking? Authorizing Provider  Ascorbic Acid (VITAMIN C) 500 MG CAPS Take 1 tablet by mouth daily. With rose hips    [provider]  diltiazem (CARDIZEM CD) 360 MG 24 hr capsule TAKE ONE CAPSULE BY MOUTH DAILY. 02/24/22   Nahser, Wonda Cheng, MD  diphenhydramine-acetaminophen (TYLENOL PM) 25-500 MG TABS tablet Take 2 tablets by mouth at bedtime.    [provider]  doxycycline  (VIBRAMYCIN) 100 MG capsule Take 1 capsule (100 mg total) by mouth 2 (two) times daily. Patient not taking: Reported on 02/22/2022 01/03/21   Tacy Learn, PA-C  ELIQUIS 5 MG TABS tablet TAKE ONE TABLET BY MOUTH TWICE A DAY 03/28/22   Nahser, Wonda Cheng, MD  potassium chloride (KLOR-CON) 10 MEQ tablet TAKE ONE TABLET BY MOUTH DAILY 06/08/21   Nahser, Wonda Cheng, MD  PROLIA 60 MG/ML SOSY injection Inject into the skin once. 05/04/21   [provider]  rosuvastatin (CRESTOR) 10 MG tablet TAKE ONE TABLET BY MOUTH DAILY 06/03/22   Nahser, Wonda Cheng, MD  torsemide (DEMADEX) 20 MG tablet TAKE ONE TABLET BY MOUTH DAILY 05/23/22   Nahser, Wonda Cheng, MD  TYMLOS 3120 MCG/1.56ML SOPN Inject as directed daily. 11/14/18   [provider]    Physical Exam: Wt Readings from Last 3 Encounters:  08/08/22 60.8 kg  02/22/22 62 kg  07/19/21 61.5 kg   Vitals:   08/08/22 1341 08/08/22 1407 08/08/22 1425 08/08/22 1501  BP:   137/82 (!) 152/98  Pulse:   (!) 121 (!) 120  Resp:   17 (!) 23  Temp:  100.2 F (37.9 C)    TempSrc:      SpO2:   98% 98%  Weight: 60.8 kg     Height: 5' 4.5" (1.638 m)         Constitutional:  Calm & comfortable Eyes: PERRLA, lids and conjunctivae normal ENT:  Mucous membranes are moist.  Pharynx clear of exudate   Normal dentition.  Respiratory:  Extensive rhonchi bilaterally without wheezing Cardiovascular:  S1 & S2 heard, regular rate and rhythm No Murmurs Abdomen:  Non distended No tenderness, No masses Bowel sounds normal Extremities:  No clubbing / cyanosis No pedal edema  Skin:  No rashes, lesions or ulcers Neurologic:  AAO x 3 CN 2-12 grossly intact Sensation intact Strength 5/5 in all 4 extremities Psychiatric:  Normal Mood and affect    Labs on Admission: I have personally reviewed following labs and imaging studies  CBC: Recent Labs  Lab 08/08/22 1413 08/08/22 1423  WBC 7.5  --   NEUTROABS 6.2  --   HGB 12.3 12.6  HCT 38.7 37.0   MCV 100.8*  --   PLT 141*  --    Basic Metabolic Panel: Recent Labs  Lab 08/08/22 1413 08/08/22 1423  NA 141 139  K 4.6 4.6  CL 108 105  CO2 20*  --   GLUCOSE 166* 167*  BUN 30* 38*  CREATININE 1.16* 1.20*  CALCIUM 8.7*  --    GFR: Estimated Creatinine Clearance: 25.3 mL/min (A) (by C-G formula based on SCr of 1.2 mg/dL (  H)). Liver Function Tests: Recent Labs  Lab 08/08/22 1413  AST 28  ALT 14  ALKPHOS 59  BILITOT 1.0  PROT 7.5  ALBUMIN 4.0   No results for input(s): "LIPASE", "AMYLASE" in the last 168 hours. No results for input(s): "AMMONIA" in the last 168 hours. Coagulation Profile: Recent Labs  Lab 08/08/22 1413  INR 1.4*   Cardiac Enzymes: No results for input(s): "CKTOTAL", "CKMB", "CKMBINDEX", "TROPONINI" in the last 168 hours. BNP (last 3 results) No results for input(s): "PROBNP" in the last 8760 hours. HbA1C: No results for input(s): "HGBA1C" in the last 72 hours. CBG: No results for input(s): "GLUCAP" in the last 168 hours. Lipid Profile: No results for input(s): "CHOL", "HDL", "LDLCALC", "TRIG", "CHOLHDL", "LDLDIRECT" in the last 72 hours. Thyroid Function Tests: No results for input(s): "TSH", "T4TOTAL", "FREET4", "T3FREE", "THYROIDAB" in the last 72 hours. Anemia Panel: No results for input(s): "VITAMINB12", "FOLATE", "FERRITIN", "TIBC", "IRON", "RETICCTPCT" in the last 72 hours. Urine analysis:    Component Value Date/Time   COLORURINE YELLOW 07/13/2018 0811   APPEARANCEUR HAZY (A) 07/13/2018 0811   LABSPEC 1.013 07/13/2018 0811   PHURINE 7.0 07/13/2018 0811   GLUCOSEU NEGATIVE 07/13/2018 0811   HGBUR NEGATIVE 07/13/2018 0811   BILIRUBINUR NEGATIVE 07/13/2018 0811   BILIRUBINUR neg 09/24/2014 0907   KETONESUR NEGATIVE 07/13/2018 0811   PROTEINUR NEGATIVE 07/13/2018 0811   UROBILINOGEN negative 09/24/2014 0907   UROBILINOGEN 0.2 08/20/2011 2000   NITRITE NEGATIVE 07/13/2018 0811   LEUKOCYTESUR TRACE (A) 07/13/2018 0811   Sepsis  Labs: '@LABRCNTIP'$ (procalcitonin:4,lacticidven:4) ) Recent Results (from the past 240 hour(s))  Resp panel by RT-PCR (RSV, Flu A&B, Covid) Anterior Nasal Swab     Status: Abnormal   Collection Time: 08/08/22  1:59 PM   Specimen: Anterior Nasal Swab  Result Value Ref Range Status   SARS Coronavirus 2 by RT PCR NEGATIVE NEGATIVE Final    Comment: (NOTE) SARS-CoV-2 target nucleic acids are NOT DETECTED.  The SARS-CoV-2 RNA is generally detectable in upper respiratory specimens during the acute phase of infection. The lowest concentration of SARS-CoV-2 viral copies this assay can detect is 138 copies/mL. A negative result does not preclude SARS-Cov-2 infection and should not be used as the sole basis for treatment or other patient management decisions. A negative result may occur with  improper specimen collection/handling, submission of specimen other than nasopharyngeal swab, presence of viral mutation(s) within the areas targeted by this assay, and inadequate number of viral copies(<138 copies/mL). A negative result must be combined with clinical observations, patient history, and epidemiological information. The expected result is Negative.  Fact Sheet for Patients:  EntrepreneurPulse.com.au  Fact Sheet for Healthcare Providers:  IncredibleEmployment.be  This test is no t yet approved or cleared by the [place] FDA and  has been authorized for detection and/or diagnosis of SARS-CoV-2 by FDA under an Emergency Use Authorization (EUA). This EUA will remain  in effect (meaning this test can be used) for the duration of the COVID-19 declaration under Section 564(b)(1) of the Act, 21 U.S.C.section 360bbb-3(b)(1), unless the authorization is terminated  or revoked sooner.       Influenza A by PCR NEGATIVE NEGATIVE Final   Influenza B by PCR NEGATIVE NEGATIVE Final    Comment: (NOTE) The Xpert Xpress SARS-CoV-2/FLU/RSV plus assay is intended  as an aid in the diagnosis of influenza from Nasopharyngeal swab specimens and should not be used as a sole basis for treatment. Nasal washings and aspirates are unacceptable for Xpert Xpress SARS-CoV-2/FLU/RSV  testing.  Fact Sheet for Patients: EntrepreneurPulse.com.au  Fact Sheet for Healthcare Providers: IncredibleEmployment.be  This test is not yet approved or cleared by the [place] FDA and has been authorized for detection and/or diagnosis of SARS-CoV-2 by FDA under an Emergency Use Authorization (EUA). This EUA will remain in effect (meaning this test can be used) for the duration of the COVID-19 declaration under Section 564(b)(1) of the Act, 21 U.S.C. section 360bbb-3(b)(1), unless the authorization is terminated or revoked.     Resp Syncytial Virus by PCR POSITIVE (A) NEGATIVE Final    Comment: (NOTE) Fact Sheet for Patients: EntrepreneurPulse.com.au  Fact Sheet for Healthcare Providers: IncredibleEmployment.be  This test is not yet approved or cleared by the [place] FDA and has been authorized for detection and/or diagnosis of SARS-CoV-2 by FDA under an Emergency Use Authorization (EUA). This EUA will remain in effect (meaning this test can be used) for the duration of the COVID-19 declaration under Section 564(b)(1) of the Act, 21 U.S.C. section 360bbb-3(b)(1), unless the authorization is terminated or revoked.  Performed at [facility], [place] [address] [place], [place] [address]   Blood Culture (routine x 2)     Status: None (Preliminary result)   Collection Time: 08/08/22  2:19 PM   Specimen: BLOOD  Result Value Ref Range Status   Specimen Description   Final    BLOOD BLOOD LEFT HAND Performed at [place] [address] [place], [place] [address]    Special Requests   Final    Blood Culture results may not be optimal due to an  inadequate volume of blood received in culture bottles BOTTLES DRAWN AEROBIC ONLY Performed at [facility], [place] [address] [place], [place] [address]    Culture PENDING  Incomplete   Report Status PENDING  Incomplete     Radiological Exams on Admission: DG Chest Portable 1 View  Result Date: 08/08/2022 CLINICAL DATA:  Possible sepsis, cough EXAM: PORTABLE CHEST 1 VIEW COMPARISON:  01/03/2021 FINDINGS: Transverse diameter of heart is increased. Pacemaker battery is seen in left infraclavicular region. There is evidence of previous cardiac surgery. Lung fields are clear of any infiltrates or pulmonary edema. There is no pleural effusion or pneumothorax. IMPRESSION: There are no signs of pulmonary edema or focal pulmonary consolidation. Electronically Signed   By: Elmer Picker M.D.   On: 08/08/2022 14:32    EKG: Independently reviewed.  Junctional tachycardia at 125 bpm  Assessment/Plan Principal Problem:   RSV (acute bronchiolitis due to respiratory syncytial virus) -Chest x-ray does not suggest any pneumonia however cough is quite severe and she may have an underlying bacterial infection - She has been started on ceftriaxone and azithromycin in the ED which I will continue -Pulse ox is in the high 90s and this is reassuring - Antitussives have been ordered-avoid nebulizer treatments in the setting of A-fib with RVR  Active Problems:   CAD (coronary artery disease) s/p CABG   Atrial fibrillation, chronic with RVR    Pacemaker-Medtronic - RVR likely secondary to cough and infection - 1 L normal saline bolus has been given but heart rate has not improved much - Have resumed oral Cardizem and will order as needed IV metoprolol-as her respiratory infection improves her heart rate should as well - Continue Eliquis-hold Demadex - Last echo from 2015 did not reveal any heart failure  Chronic kidney disease stage IIIb - Creatinine is at baseline at about 1.2 - Hold further IV  fluids and hold Demadex    Hard of hearing     DVT prophylaxis: Eliquis Code Status: Full code Consults called: None Admission status:  Level of care: Telemetry  Debbe Odea MD [facility]    08/08/2022, 4:00 PM

## 2022-08-09 ENCOUNTER — Inpatient Hospital Stay (HOSPITAL_COMMUNITY): Payer: Medicare HMO

## 2022-08-09 DIAGNOSIS — J210 Acute bronchiolitis due to respiratory syncytial virus: Secondary | ICD-10-CM | POA: Diagnosis not present

## 2022-08-09 DIAGNOSIS — I482 Chronic atrial fibrillation, unspecified: Secondary | ICD-10-CM | POA: Diagnosis not present

## 2022-08-09 LAB — CBC
HCT: 34.1 % — ABNORMAL LOW (ref 36.0–46.0)
Hemoglobin: 10.9 g/dL — ABNORMAL LOW (ref 12.0–15.0)
MCH: 31.9 pg (ref 26.0–34.0)
MCHC: 32 g/dL (ref 30.0–36.0)
MCV: 99.7 fL (ref 80.0–100.0)
Platelets: 123 10*3/uL — ABNORMAL LOW (ref 150–400)
RBC: 3.42 MIL/uL — ABNORMAL LOW (ref 3.87–5.11)
RDW: 13 % (ref 11.5–15.5)
WBC: 5.6 10*3/uL (ref 4.0–10.5)
nRBC: 0 % (ref 0.0–0.2)

## 2022-08-09 LAB — BASIC METABOLIC PANEL
Anion gap: 9 (ref 5–15)
BUN: 22 mg/dL (ref 8–23)
CO2: 23 mmol/L (ref 22–32)
Calcium: 8 mg/dL — ABNORMAL LOW (ref 8.9–10.3)
Chloride: 109 mmol/L (ref 98–111)
Creatinine, Ser: 0.84 mg/dL (ref 0.44–1.00)
GFR, Estimated: >60 mL/min (ref >60)
Glucose, Bld: 83 mg/dL (ref 70–99)
Potassium: 3.7 mmol/L (ref 3.5–5.1)
Sodium: 141 mmol/L (ref 135–145)

## 2022-08-09 LAB — GLUCOSE, CAPILLARY: Glucose-Capillary: 86 mg/dL (ref 70–99)

## 2022-08-09 MED ORDER — METOPROLOL TARTRATE 5 MG/5ML IV SOLN
2.5000 mg | Freq: Once | INTRAVENOUS | Status: AC
  Administered 2022-08-09: 2.5 mg via INTRAVENOUS
  Filled 2022-08-09: qty 5

## 2022-08-09 MED ORDER — METOPROLOL TARTRATE 12.5 MG HALF TABLET
12.5000 mg | ORAL_TABLET | Freq: Two times a day (BID) | ORAL | Status: DC
  Administered 2022-08-09 – 2022-08-12 (×7): 12.5 mg via ORAL
  Filled 2022-08-09 (×7): qty 1

## 2022-08-09 MED ORDER — ACETYLCYSTEINE 20 % IN SOLN: 600.0000 mg | Freq: Once | RESPIRATORY_TRACT | Status: DC

## 2022-08-09 MED ORDER — ACETYLCYSTEINE 20 % IN SOLN
3.0000 mL | Freq: Once | RESPIRATORY_TRACT | Status: DC
  Filled 2022-08-09: qty 4

## 2022-08-09 NOTE — Progress Notes (Signed)
[facility] Progress Note  Patient: Donna Ortega     IDP:[id number]  DOA: 08/08/2022   PCP: Patient, No Pcp Per       Brief hospital course:  Donna Ortega is a 88 y.o. female with medical history of atrial fibrillation, coronary artery disease status post CABG, pacemaker who presents to the hospital for 4 days of progressive cough.  Her cough is so severe now that she is very uncomfortable.  Cough is congested she has no significant shortness of breath.   Subjective:  Continues to have a cough. No other complaints.  Assessment and Plan: Principal Problem:   RSV (acute bronchiolitis due to respiratory syncytial virus) -Chest x-ray does not suggest any pneumonia however cough is quite severe and she may have an underlying bacterial infection - no hypoxia - cont ceftriaxone and azithromycin   - Antitussives have been ordered-avoid nebulizer treatments in the setting of A-fib with RVR - still has a severe cough today- add Mucinex once, flutter valve and IS   Active Problems:    Atrial fibrillation, chronic with RVR    Pacemaker-Medtronic   CAD (coronary artery disease) s/p CABG - RVR likely secondary to cough and infection - Have resumed oral Cardizem -as her respiratory infection improves her heart rate should as well - Continue Eliquis-hold Demadex - Last echo from 2015 did not reveal any heart failure - HR still in 120s this AM - added 2.5 mg IV Lopressor which helped HR slow to 70s- will add oral Metoprolol now at 12.5 mg (SBP in 90s)   Chronic kidney disease stage IIIb - Creatinine is at baseline at about 1.2 - Hold further IV fluids and hold Demadex     Hard of hearing     Code Status: Full Code Consultants: none Level of Care: Level of care: Telemetry Total time on patient care: 35 DVT prophylaxis:   apixaban (ELIQUIS) tablet 5 mg     Objective:   Vitals:   08/09/22 0130 08/09/22 0558 08/09/22 1002 08/09/22 1422  BP: 104/81 127/76 (!) 137/95 (!) 98/59   Pulse: (!) 110 (!) 58 (!) 125 79  Resp: '20 18 20 20  '$ Temp: 99 F (37.2 C) 97.9 F (36.6 C) 100.2 F (37.9 C) 98.2 F (36.8 C)  TempSrc: Oral Oral Oral Oral  SpO2: 94% 97% 98% 95%  Weight:      Height:       Filed Weights   08/08/22 1341  Weight: 60.8 kg   Exam: General exam: Appears comfortable  HEENT: oral mucosa moist Respiratory system: excessive rhonchi Cardiovascular system: S1 & S2 heard - IIRR Gastrointestinal system: Abdomen soft, non-tender, nondistended. Normal bowel sounds   Extremities: No cyanosis, clubbing or edema Psychiatry:  Mood & affect appropriate.      CBC: Recent Labs  Lab 08/08/22 1413 08/08/22 1423 08/09/22 0540  WBC 7.5  --  5.6  NEUTROABS 6.2  --   --   HGB 12.3 12.6 10.9*  HCT 38.7 37.0 34.1*  MCV 100.8*  --  99.7  PLT 141*  --  443*   Basic Metabolic Panel: Recent Labs  Lab 08/08/22 1413 08/08/22 1423 08/09/22 0540  NA 141 139 141  K 4.6 4.6 3.7  CL 108 105 109  CO2 20*  --  23  GLUCOSE 166* 167* 83  BUN 30* 38* 22  CREATININE 1.16* 1.20* 0.84  CALCIUM 8.7*  --  8.0*   GFR: Estimated Creatinine Clearance: 35.4 mL/min (by C-G formula based  on SCr of 0.84 mg/dL).  Scheduled Meds:  apixaban  5 mg Oral BID   chlorpheniramine-HYDROcodone  5 mL Oral Q12H   dextromethorphan-guaiFENesin  1 tablet Oral BID   diltiazem  360 mg Oral Daily   Continuous Infusions:  azithromycin 500 mg (08/09/22 1332)   cefTRIAXone (ROCEPHIN)  IV 2 g (08/09/22 1444)   Imaging and lab data was personally reviewed DG Chest Portable 1 View  Result Date: 08/08/2022 CLINICAL DATA:  Possible sepsis, cough EXAM: PORTABLE CHEST 1 VIEW COMPARISON:  01/03/2021 FINDINGS: Transverse diameter of heart is increased. Pacemaker battery is seen in left infraclavicular region. There is evidence of previous cardiac surgery. Lung fields are clear of any infiltrates or pulmonary edema. There is no pleural effusion or pneumothorax. IMPRESSION: There are no signs of  pulmonary edema or focal pulmonary consolidation. Electronically Signed   By: Elmer Picker M.D.   On: 08/08/2022 14:32    LOS: 1 day   Author: Debbe Odea  08/09/2022 3:16 PM  To contact [facility]>   Check the care team in [facility] and look for the attending/consulting [facility] provider listed  Log into [email or web address] and use [facility]'s universal password   Go to> "[facility]"  and find provider  If you still have difficulty reaching the provider, please page the [facility] (Director on Call) for the Hospitalists listed on amion

## 2022-08-10 DIAGNOSIS — J121 Respiratory syncytial virus pneumonia: Secondary | ICD-10-CM

## 2022-08-10 DIAGNOSIS — J210 Acute bronchiolitis due to respiratory syncytial virus: Secondary | ICD-10-CM | POA: Diagnosis not present

## 2022-08-10 LAB — URINE CULTURE: Culture: 20000 — AB

## 2022-08-10 LAB — GLUCOSE, CAPILLARY
Glucose-Capillary: 88 mg/dL (ref 70–99)
Glucose-Capillary: 90 mg/dL (ref 70–99)

## 2022-08-10 MED ORDER — IPRATROPIUM-ALBUTEROL 0.5-2.5 (3) MG/3ML IN SOLN: 3.0000 mL | RESPIRATORY_TRACT | Status: DC | PRN

## 2022-08-10 NOTE — Evaluation (Signed)
Physical Therapy Evaluation Patient Details Name: Donna Ortega MRN: [id number] DOB: March 25, 1926 Today's Date: 08/10/2022  History of Present Illness  Pt is 88 yo female admitted 08/08/22 with RSV.  Pt with hx including but not limited to afib, CAD, CABG, pacemaker.  Clinical Impression  Pt admitted with above diagnosis. At baseline, pt ambulatory without AD and lives at [facility] (unsure if she is ALF or ILF).  Today, pt with mild confusion (thought she was at her facility) but able to follow commands. She was able to ambulate 120' without AD and required min guard for safety but had no loss of balance.  Will benefit from PT to advance mobility while hospitalized.   Pt currently with functional limitations due to the deficits listed below (see PT Problem List). Pt will benefit from skilled PT to increase their independence and safety with mobility to allow discharge to the venue listed below.          Recommendations for follow up therapy are one component of a multi-disciplinary discharge planning process, led by the attending physician.  Recommendations may be updated based on patient status, additional functional criteria and insurance authorization.  Follow Up Recommendations Home health PT (at ALF)      Assistance Recommended at Discharge Intermittent Supervision/Assistance  Patient can return home with the following  A little help with walking and/or transfers;A little help with bathing/dressing/bathroom;Assistance with cooking/housework    Equipment Recommendations None recommended by PT  Recommendations for Other Services       Functional Status Assessment Patient has had a recent decline in their functional status and demonstrates the ability to make significant improvements in function in a reasonable and predictable amount of time.     Precautions / Restrictions Precautions Precautions: Fall      Mobility  Bed Mobility               General bed mobility  comments: in chair    Transfers Overall transfer level: Needs assistance Equipment used: None Transfers: Sit to/from Stand Sit to Stand: Min guard                Ambulation/Gait Ambulation/Gait assistance: Min guard Gait Distance (Feet): 120 Feet Assistive device: None Gait Pattern/deviations: Step-through pattern, Decreased stride length, Trunk flexed Gait velocity: decreased     General Gait Details: slight drifting R/L in hallway but no overt loss of balance  Stairs            Wheelchair Mobility    Modified Rankin (Stroke Patients Only)       Balance Overall balance assessment: Needs assistance Sitting-balance support: No upper extremity supported Sitting balance-Leahy Scale: Good     Standing balance support: No upper extremity supported Standing balance-Leahy Scale: Good Standing balance comment: ambulated without AD                             Pertinent Vitals/Pain Pain Assessment Pain Assessment: No/denies pain    Home Living Family/patient expects to be discharged to:: Other (Comment) [employer] (not sure if ILF or ALF))   Available Help at Discharge: Family;Available PRN/intermittently Type of Home:  (ALF vs ILF) Home Access: Level entry       Home Layout: One level Home Equipment: Conservation officer, nature (2 wheels)      Prior Function Prior Level of Function : Independent/Modified Independent             Mobility Comments: ambulates in  community; does not use AD; denies falls ADLs Comments: independent with ADLs; does her own shopping; gets meals at facility; does not drive     Hand Dominance        Extremity/Trunk Assessment   Upper Extremity Assessment Upper Extremity Assessment: Overall WFL for tasks assessed    Lower Extremity Assessment Lower Extremity Assessment: LLE deficits/detail;RLE deficits/detail RLE Deficits / Details: ROM WFL : MMT 5/5 LLE Deficits / Details: ROM WFL; MMT 5/5    Cervical /  Trunk Assessment Cervical / Trunk Assessment: Normal  Communication   Communication: HOH  Cognition Arousal/Alertness: Awake/alert Behavior During Therapy: WFL for tasks assessed/performed Overall Cognitive Status: No family/caregiver present to determine baseline cognitive functioning                                 General Comments: Some confusion -thought she was at her facility instead of hospital; followed basic commands        General Comments General comments (skin integrity, edema, etc.): Pt on 2 L O2 with sats 96% or >.  Noted nursing recently checked and dropped on RA so did not retest    Exercises     Assessment/Plan    PT Assessment Patient needs continued PT services  PT Problem List Decreased strength;Decreased mobility;Cardiopulmonary status limiting activity;Decreased cognition;Decreased activity tolerance;Decreased knowledge of use of DME;Decreased balance;Decreased safety awareness;Decreased knowledge of precautions       PT Treatment Interventions DME instruction;Therapeutic exercise;Gait training;Balance training;Neuromuscular re-education;Functional mobility training;Therapeutic activities;Patient/family education;Cognitive remediation    PT Goals (Current goals can be found in the Care Plan section)  Acute Rehab PT Goals Patient Stated Goal: return home PT Goal Formulation: With patient Time For Goal Achievement: 08/24/22 Potential to Achieve Goals: Good    Frequency Min 3X/week     Co-evaluation               AM-PAC PT "6 Clicks" Mobility  Outcome Measure Help needed turning from your back to your side while in a flat bed without using bedrails?: A Little Help needed moving from lying on your back to sitting on the side of a flat bed without using bedrails?: A Little Help needed moving to and from a bed to a chair (including a wheelchair)?: A Little Help needed standing up from a chair using your arms (e.g., wheelchair or  bedside chair)?: A Little Help needed to walk in hospital room?: A Little Help needed climbing 3-5 steps with a railing? : A Lot 6 Click Score: 17    End of Session Equipment Utilized During Treatment: Gait belt Activity Tolerance: Patient tolerated treatment well Patient left: with chair alarm set;in chair;with call bell/phone within reach Nurse Communication: Mobility status PT Visit Diagnosis: Muscle weakness (generalized) (M62.81);Other abnormalities of gait and mobility (R26.89)    Time: 4562-5638 PT Time Calculation (min) (ACUTE ONLY): 17 min   Charges:   PT Evaluation $PT Eval Low Complexity: 1 Low          Donna Ortega, PT Acute Rehab [facility] Rehab [phone]   Donna Ortega 08/10/2022, 1:42 PM

## 2022-08-10 NOTE — TOC Initial Note (Signed)
Transition of Care [facility]) - Initial/Assessment Note    Patient Details  Name: Donna Ortega MRN: [id number] Date of Birth: 11-19-1925  Transition of [facility]) CM/SW Contact:    Vassie Moselle, LCSW Phone Number: 08/10/2022, 1:58 PM  Clinical Narrative:                 Pt a current resident at [facility]. Pt ambulates independently at baseline per DIL. Pt's DIL is agreeable to home health PT being arranged. [facility] has been contacted for HHPT. HH orders will need to be placed prior to discharge.  TOC will continue to follow for possible home O2 need.   Expected Discharge Plan: New Donna Barriers to Discharge: No Barriers Identified   Patient Goals and CMS Choice Patient states their goals for this hospitalization and ongoing recovery are:: For pt to return to [place] CMS Medicare.gov Compare Post Acute Care list provided to:: Patient Represenative (must comment) Choice offered to / list presented to : Adult [place] ownership interest in [facility].provided to::  (NA)    Expected Discharge Plan and Services In-house Referral: NA Discharge Planning Services: NA Post Acute Care Choice: Home Health, Durable Medical Equipment Living arrangements for the past 2 months: [place]: PT [place] Agency: 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) Date HH Agency Contacted: 08/10/22 [facility]: [id number] Representative spoke with at [place]: Edie Arrangements/Services Living arrangements for the past 2 months: Materials engineer Lives with:: Self Patient language and need for interpreter reviewed:: Yes Do you feel safe going back to the place where you live?: Yes      Need for Family Participation in Patient Care: Yes (Comment) Care giver support system in place?: No (comment)   Criminal Activity/Legal Involvement Pertinent to Current  Situation/Hospitalization: No - Comment as needed  Activities of Daily Living Home Assistive Devices/Equipment: None ADL Screening (condition at time of admission) Patient's cognitive ability adequate to safely complete daily activities?: Yes Is the patient deaf or have difficulty hearing?: Yes Does the patient have difficulty seeing, even when wearing glasses/contacts?: No Does the patient have difficulty concentrating, remembering, or making decisions?: No Patient able to express need for assistance with ADLs?: Yes Does the patient have difficulty dressing or bathing?: Yes Independently performs ADLs?: Yes (appropriate for developmental age) Does the patient have difficulty walking or climbing stairs?: No Weakness of Legs: None Weakness of Arms/Hands: None  Permission Sought/Granted Permission sought to share information with : Facility Sport and exercise psychologist, Family Supports Permission granted to share information with : Yes, Verbal Permission Granted  Share Information with NAME: Amy Littrell  Permission granted to share info w AGENCY: [place] granted to share info w Relationship: DIL  Permission granted to share info w Contact Information: [phone]  Emotional Assessment   Attitude/Demeanor/Rapport: Unable to Assess Affect (typically observed): Unable to Assess Orientation: : Oriented to Self, Oriented to Place Alcohol / Substance Use: Not Applicable Psych Involvement: No (comment)  Admission diagnosis:  Acute respiratory distress [R06.03] RSV (respiratory syncytial virus pneumonia) [J12.1] Patient Active Problem List   Diagnosis Date Noted   RSV (acute bronchiolitis due to respiratory syncytial virus) 08/08/2022   Hard of hearing 08/08/2022   Hypovolemia    AKI (acute kidney  injury) ([place]) 04/01/2017   Small bowel obstruction ([place]) 03/31/2017   Edema 04/09/2014   Vertebral compression fracture ([place]) 10/29/2013   Compressed spine  fracture ([place]) 10/29/2013   Gastric erosions 10/09/2013   Decreased hearing of both ears 10/09/2013   Normocytic anemia 06/26/2013   Osteoporosis  Fosamax restarted 10/2013 06/18/2013   Osteopenia 05/05/2013   Palpitation 02/19/2013   Insomnia 02/18/2013   Candida esophagitis ([place]) 01/22/2013   Gastric ulcer 01/22/2013   UTI (urinary tract infection) 01/17/2013   HLD (hyperlipidemia) 06/19/2012   Atrial tachycardia 05/22/2012   Pacemaker-Medtronic 02/09/2012   Sinus node dysfunction (HCC) 02/06/2012   Syncope and collapse 02/06/2012   Benign hypertension 09/27/2011   Rash 09/19/2011   UTI (lower urinary tract infection) 08/21/2011   Fatigue 08/21/2011   Peptic ulcer disease with hemorrhage 08/14/2011   S/P vertebroplasty 08/14/2011   H/O Spinal surgery 08/14/2011   Chronic kidney disease (CKD), stage III (moderate) ([place]) 08/05/2011   Anemia associated with acute blood loss 08/04/2011   Hyponatremia 08/04/2011   GI bleed 08/04/2011   Dyslipidemia 08/04/2011   Melena 08/03/2011   CAD (coronary artery disease) s/p CABG 01/11/2011   Atrial fibrillation, chronic ([place]) 01/11/2011   HTN (hypertension) 01/11/2011   Arteriosclerosis of coronary artery 01/11/2011   PCP:  Patient, No Pcp Per Pharmacy:   [place] [place] [address] [place] [address] Phone: [phone] Fax: [phone]     Social Determinants of Health (SDOH) Social History: SDOH Screenings   Food Insecurity: No Food Insecurity (08/08/2022)  Housing: Low Risk  (08/08/2022)  Transportation Needs: No Transportation Needs (08/08/2022)  Utilities: Not At Risk (08/08/2022)  Tobacco Use: Low Risk  (08/08/2022)   SDOH Interventions:     Readmission Risk Interventions    08/10/2022    1:55 PM  Readmission Risk Prevention Plan  Transportation Screening Complete  PCP or Specialist Appt within 5-7 Days Complete  Home Care Screening Complete   Medication Review (RN CM) Complete

## 2022-08-10 NOTE — Hospital Course (Signed)
88 y.o. female with medical history of atrial fibrillation, coronary artery disease status post CABG, pacemaker who presents to the hospital for 4 days of progressive cough.  Her cough is so severe now that she is very uncomfortable.  Cough is congested she has no significant shortness of breath.

## 2022-08-10 NOTE — Progress Notes (Signed)
SATURATION QUALIFICATIONS: (This note is used to comply with regulatory documentation for home oxygen)  Patient Saturations on Room Air at Rest = 90%  Patient Saturations on Room Air while Ambulating = 87%  Patient Saturations on 2 Liters of oxygen while Ambulating = 94%  Please briefly explain why patient needs home oxygen: Patient desats while ambulating without O2, dyspnea on exertion.

## 2022-08-10 NOTE — Progress Notes (Signed)
  Progress Note   Patient: Donna Ortega:[id number] DOB: [birth year] DOA: 08/08/2022     2 DOS: the patient was seen and examined on 08/10/2022   Brief hospital course: 88 y.o. female with medical history of atrial fibrillation, coronary artery disease status post CABG, pacemaker who presents to the hospital for 4 days of progressive cough.  Her cough is so severe now that she is very uncomfortable.  Cough is congested she has no significant shortness of breath.   Assessment and Plan: Principal Problem:   RSV (acute bronchiolitis due to respiratory syncytial virus) -Chest x-ray does not suggest any pneumonia however cough is quite severe and she may have an underlying bacterial infection - cont ceftriaxone and azithromycin   - Antitussives have been ordered - cont Mucinex, flutter valve and IS -Continues to cough with audible wheezing. Will add PRN neb -Desaturated to 87% on RA, still requiring 2LNC. Pt is O2 naive   Active Problems:     Atrial fibrillation, chronic with RVR    Pacemaker-Medtronic   CAD (coronary artery disease) s/p CABG - RVR likely secondary to cough and infection - resumed oral Cardizem with added metoprolol - Continue Eliquis-hold Demadex - Last echo from 2015 did not reveal any heart failure -HR controlled today   Chronic kidney disease stage IIIb - Creatinine is at baseline at about 1.2 - Holding further IV fluids and hold Demadex - Cr normalized at 0.84     Hard of hearing      Subjective: Reports feeling "bad" this AM, coughing   Physical Exam: Vitals:   08/09/22 1422 08/09/22 2023 08/10/22 0412 08/10/22 1357  BP: (!) 98/59 115/65 111/63 (!) 149/47  Pulse: 79 67 63 65  Resp: 20 (!) '21 18 18  '$ Temp: 98.2 F (36.8 C) 97.9 F (36.6 C) 98 F (36.7 C)   TempSrc: Oral Oral Oral   SpO2: 95% 99% 96% 96%  Weight:      Height:       General exam: Awake, sitting in chair Respiratory system: actively coughing with increased resp effort afterwards,  end-expiratory wheezing Cardiovascular system: regular rate, s1, s2 Gastrointestinal system: Soft, nondistended, positive BS Central nervous system: CN2-12 grossly intact, strength intact Extremities: Perfused, no clubbing Skin: Normal skin turgor, no notable skin lesions seen Psychiatry: Mood normal // no visual hallucinations   Data Reviewed:  Labs reviewed: Na 141, K 3.7, Cr 0.84  Family Communication: Pt in room, family not at bedside  Disposition: Status is: Inpatient Remains inpatient appropriate because: Severity of illness  Planned Discharge Destination: Home    Author: Marylu Lund, MD 08/10/2022 5:17 PM  For on call review [email or web address].

## 2022-08-11 ENCOUNTER — Inpatient Hospital Stay (HOSPITAL_COMMUNITY): Payer: Medicare HMO

## 2022-08-11 DIAGNOSIS — J121 Respiratory syncytial virus pneumonia: Secondary | ICD-10-CM | POA: Diagnosis not present

## 2022-08-11 DIAGNOSIS — R52 Pain, unspecified: Secondary | ICD-10-CM

## 2022-08-11 DIAGNOSIS — J210 Acute bronchiolitis due to respiratory syncytial virus: Secondary | ICD-10-CM | POA: Diagnosis not present

## 2022-08-11 LAB — CBC
HCT: 33.3 % — ABNORMAL LOW (ref 36.0–46.0)
Hemoglobin: 10.4 g/dL — ABNORMAL LOW (ref 12.0–15.0)
MCH: 31.7 pg (ref 26.0–34.0)
MCHC: 31.2 g/dL (ref 30.0–36.0)
MCV: 101.5 fL — ABNORMAL HIGH (ref 80.0–100.0)
Platelets: 140 10*3/uL — ABNORMAL LOW (ref 150–400)
RBC: 3.28 MIL/uL — ABNORMAL LOW (ref 3.87–5.11)
RDW: 13 % (ref 11.5–15.5)
WBC: 6.4 10*3/uL (ref 4.0–10.5)
nRBC: 0 % (ref 0.0–0.2)

## 2022-08-11 LAB — COMPREHENSIVE METABOLIC PANEL
ALT: 17 U/L (ref 0–44)
AST: 22 U/L (ref 15–41)
Albumin: 3.3 g/dL — ABNORMAL LOW (ref 3.5–5.0)
Alkaline Phosphatase: 48 U/L (ref 38–126)
Anion gap: 6 (ref 5–15)
BUN: 26 mg/dL — ABNORMAL HIGH (ref 8–23)
CO2: 25 mmol/L (ref 22–32)
Calcium: 7.9 mg/dL — ABNORMAL LOW (ref 8.9–10.3)
Chloride: 109 mmol/L (ref 98–111)
Creatinine, Ser: 0.88 mg/dL (ref 0.44–1.00)
GFR, Estimated: >60 mL/min (ref >60)
Glucose, Bld: 97 mg/dL (ref 70–99)
Potassium: 3.9 mmol/L (ref 3.5–5.1)
Sodium: 140 mmol/L (ref 135–145)
Total Bilirubin: 0.6 mg/dL (ref 0.3–1.2)
Total Protein: 6.4 g/dL — ABNORMAL LOW (ref 6.5–8.1)

## 2022-08-11 NOTE — Care Management Important Message (Signed)
Important Message  Patient Details IM Letter given. Name: Donna Ortega MRN: [id number] Date of Birth: [birth year]   Medicare Important Message Given:  Yes     Kerin Salen 08/11/2022, 10:03 AM

## 2022-08-11 NOTE — Progress Notes (Signed)
Left lower extremity venous duplex has been completed. Preliminary results can be found in CV Proc through chart review.   08/11/22 12:15 PM Donna Ortega RVT

## 2022-08-11 NOTE — Progress Notes (Signed)
  Progress Note   Patient: Donna Ortega ENI:[id number] DOB: 04/16/1926 DOA: 08/08/2022     3 DOS: the patient was seen and examined on 08/11/2022   Brief hospital course: 88 y.o. female with medical history of atrial fibrillation, coronary artery disease status post CABG, pacemaker who presents to the hospital for 4 days of progressive cough.  Her cough is so severe now that she is very uncomfortable.  Cough is congested she has no significant shortness of breath.   Assessment and Plan: Principal Problem:   RSV (acute bronchiolitis due to respiratory syncytial virus) -Chest x-ray does not suggest any pneumonia however cough is quite severe and she may have an underlying bacterial infection - cont ceftriaxone and azithromycin   - Antitussives have been ordered - cont Mucinex, flutter valve and IS -Continues to cough with audible wheezing. Added PRN neb -On 12/27, desaturated to 87% on RA. Currently still requiring O2   Active Problems:     Atrial fibrillation, chronic with RVR    Pacemaker-Medtronic   CAD (coronary artery disease) s/p CABG - RVR likely secondary to cough and infection - resumed oral Cardizem with added metoprolol - Continue Eliquis-hold Demadex - Last echo from 2015 did not reveal any heart failure -HR remains controlled today   Chronic kidney disease stage IIIb - Creatinine is at baseline at about 1.2 - Holding further IV fluids and hold Demadex - Cr normalized at 0.84     Hard of hearing  Osteoarthritis -complaints of acute L knee pain and swelling -L knee with palpable swelling and tenderness on palpation -Ordered and reviewed L knee xray, findings of tricompartmental osteoarthritis -Discussed with Orthopedic Surgery who will evaluate -cont with PT as tolerated      Subjective: Complains of L knee pain  Physical Exam: Vitals:   08/10/22 1357 08/10/22 1926 08/11/22 0627 08/11/22 1218  BP: (!) 149/47 (!) 123/56 129/64 134/79  Pulse: 65 66 75 66   Resp: '18 16 16 16  '$ Temp:  98 F (36.7 C) 97.6 F (36.4 C) 97.6 F (36.4 C)  TempSrc:  Oral Oral Oral  SpO2: 96% 99% 95% 95%  Weight:      Height:       General exam: Conversant, in no acute distress Respiratory system: normal chest rise, clear, no audible wheezing Cardiovascular system: regular rhythm, s1-s2 Gastrointestinal system: Nondistended, nontender, pos BS Central nervous system: No seizures, no tremors Extremities: No cyanosis, L knee with palpable effusion, tenderness on palpation Skin: No rashes, no pallor Psychiatry: Affect normal // no auditory hallucinations   Data Reviewed:  Labs reviewed: Na 140, K 3.9, Cr 0.88, WBC 6.4, Hgb 10.4, Plts 140  Family Communication: Pt in room, family not at bedside  Disposition: Status is: Inpatient Remains inpatient appropriate because: Severity of illness  Planned Discharge Destination: Home    Author: Marylu Lund, MD 08/11/2022 4:05 PM  For on call review [email or web address].

## 2022-08-12 DIAGNOSIS — J121 Respiratory syncytial virus pneumonia: Secondary | ICD-10-CM | POA: Diagnosis not present

## 2022-08-12 DIAGNOSIS — J210 Acute bronchiolitis due to respiratory syncytial virus: Secondary | ICD-10-CM | POA: Diagnosis not present

## 2022-08-12 MED ORDER — METHYLPREDNISOLONE ACETATE 40 MG/ML IJ SUSP
40.0000 mg | Freq: Once | INTRAMUSCULAR | Status: AC
  Administered 2022-08-12: 40 mg via INTRA_ARTICULAR
  Filled 2022-08-12: qty 1

## 2022-08-12 MED ORDER — BUPIVACAINE HCL 0.5 % IJ SOLN
10.0000 mL | Freq: Once | INTRAMUSCULAR | Status: DC
  Filled 2022-08-12: qty 10

## 2022-08-12 MED ORDER — BUPIVACAINE HCL (PF) 0.5 % IJ SOLN
10.0000 mL | Freq: Once | INTRAMUSCULAR | Status: AC
  Administered 2022-08-12: 10 mL
  Filled 2022-08-12: qty 10

## 2022-08-12 NOTE — Progress Notes (Signed)
  Progress Note   Patient: Donna Ortega WCH:[id number] DOB: 1925-10-01 DOA: 08/08/2022     4 DOS: the patient was seen and examined on 08/12/2022   Brief hospital course: 88 y.o. female with medical history of atrial fibrillation, coronary artery disease status post CABG, pacemaker who presents to the hospital for 4 days of progressive cough.  Her cough is so severe now that she is very uncomfortable.  Cough is congested she has no significant shortness of breath.   Assessment and Plan: Principal Problem:   RSV (acute bronchiolitis due to respiratory syncytial virus) -Chest x-ray does not suggest any pneumonia however cough is quite severe and she may have an underlying bacterial infection - cont ceftriaxone and azithromycin   - Antitussives have been ordered - cont Mucinex, flutter valve and IS -Continues to cough with audible wheezing. Added PRN neb -On 12/29, continue to desaturate on 1LNC, still needing 2L. May need supplemental O2 on d/c   Active Problems:     Atrial fibrillation, chronic with RVR    Pacemaker-Medtronic   CAD (coronary artery disease) s/p CABG - RVR likely secondary to cough and infection - resumed oral Cardizem with added metoprolol - Continue Eliquis-hold Demadex - Last echo from 2015 did not reveal any heart failure -HR remains well controlled today   Chronic kidney disease stage IIIb - Creatinine is at baseline at about 1.2 - Holding further IV fluids and hold Demadex - Cr normalized at 0.84     Hard of hearing  Osteoarthritis -complaints of acute L knee pain and swelling -L knee with palpable swelling and tenderness on palpation -Ordered and reviewed L knee xray, findings of tricompartmental osteoarthritis -Seen by Orthopedic Surgery. Pt now s/p L knee injection      Subjective: Reports feeling better today  Physical Exam: Vitals:   08/11/22 1218 08/11/22 2024 08/12/22 0558 08/12/22 1336  BP: 134/79 (!) 130/53 (!) 139/51 (!) 148/80  Pulse:  66 65 67 65  Resp: '16 17 18 18  '$ Temp: 97.6 F (36.4 C) 97.6 F (36.4 C) 97.6 F (36.4 C) 98.4 F (36.9 C)  TempSrc: Oral  Oral Oral  SpO2: 95% 94% 94% 92%  Weight:      Height:       General exam: Awake, laying in bed, in nad Respiratory system: Normal respiratory effort, no wheezing Cardiovascular system: regular rate, s1, s2 Gastrointestinal system: Soft, nondistended, positive BS Central nervous system: CN2-12 grossly intact, strength intact Extremities: Perfused, no clubbing Skin: Normal skin turgor, no notable skin lesions seen Psychiatry: Mood normal // no visual hallucinations   Data Reviewed:  There are no new results to review at this time.  Family Communication: Pt in room, family not at bedside  Disposition: Status is: Inpatient Remains inpatient appropriate because: Severity of illness  Planned Discharge Destination: Home    Author: Marylu Lund, MD 08/12/2022 7:02 PM  For on call review [email or web address].

## 2022-08-12 NOTE — Procedures (Signed)
Procedure: Left knee injection   Indication: Left knee pain   Surgeon: Silvestre Gunner, PA-C   Assist: None   Anesthesia: Topical refrigerant   EBL: None   Complications: None   Findings: After risks/benefits explained patient desires to undergo procedure. The left knee was sterilely prepped and 3m 0.5% Marcaine and '40mg'$  depo-medrol instilled. Pt tolerated the procedure well.       MLisette Abu PA-C Orthopedic Surgery [phone]

## 2022-08-12 NOTE — Progress Notes (Signed)
Physical Therapy Treatment Patient Details Name: Donna Ortega MRN: [id number] DOB: Nov 30, 1925 Today's Date: 08/12/2022   History of Present Illness Pt is 88 yo female admitted 08/08/22 with RSV.  Pt with hx including but not limited to afib, CAD, CABG, pacemaker.    PT Comments    The patient is  confused and sitting on bed edge, assisted to  [facility] and back to the bed. Patient's SPO2 88% on 2 L, Assisted with IS x 3. Encouraged cough and IS. Patient not remembering that she has Pure wick and asking about  how to call the nurse (after Patient had been shown call bell). Continue PT.   Recommendations for follow up therapy are one component of a multi-disciplinary discharge planning process, led by the attending physician.  Recommendations may be updated based on patient status, additional functional criteria and insurance authorization.  Follow Up Recommendations  Home health PT     Assistance Recommended at Discharge Frequent or constant Supervision/Assistance  Patient can return home with the following A little help with walking and/or transfers;A little help with bathing/dressing/bathroom   Equipment Recommendations  None recommended by PT    Recommendations for Other Services       Precautions / Restrictions Precautions Precautions: Fall Precaution Comments: monitor sats     Mobility  Bed Mobility   Bed Mobility: Sit to Supine           General bed mobility comments: seated on bed edge, supervision back to bed    Transfers Overall transfer level: Needs assistance Equipment used: None Transfers: Sit to/from Stand, Bed to chair/wheelchair/BSC Sit to Stand: Min assist   Step pivot transfers: Min guard       General transfer comment: multimodal cues for safety to tansfer to [facility]  and back to bed.    Ambulation/Gait                   Stairs             Wheelchair Mobility    Modified Rankin (Stroke Patients Only)       Balance    Sitting-balance support: No upper extremity supported Sitting balance-Leahy Scale: Good     Standing balance support: Single extremity supported Standing balance-Leahy Scale: Fair                              Cognition Arousal/Alertness: Awake/alert Behavior During Therapy: WFL for tasks assessed/performed Overall Cognitive Status: No family/caregiver present to determine baseline cognitive functioning                                 General Comments: confused, trying to get  OOB without assistance, multiple redirection for safety        Exercises      General Comments        Pertinent Vitals/Pain Pain Assessment Pain Assessment: No/denies pain    Home Living                          Prior Function            PT Goals (current goals can now be found in the care plan section) Progress towards PT goals: Progressing toward goals    Frequency    Min 3X/week      PT Plan Current plan remains appropriate  Co-evaluation              AM-PAC PT "6 Clicks" Mobility   Outcome Measure  Help needed turning from your back to your side while in a flat bed without using bedrails?: A Little Help needed moving from lying on your back to sitting on the side of a flat bed without using bedrails?: A Little Help needed moving to and from a bed to a chair (including a wheelchair)?: A Little Help needed standing up from a chair using your arms (e.g., wheelchair or bedside chair)?: A Little Help needed to walk in hospital room?: A Lot Help needed climbing 3-5 steps with a railing? : A Lot 6 Click Score: 16    End of Session Equipment Utilized During Treatment: Gait belt Activity Tolerance: Patient limited by fatigue Patient left: in bed;with call bell/phone within reach;with bed alarm set Nurse Communication: Mobility status PT Visit Diagnosis: Muscle weakness (generalized) (M62.81);Other abnormalities of gait and mobility  (R26.89)     Time: 3291-9166 PT Time Calculation (min) (ACUTE ONLY): 19 min  Charges:  $Therapeutic Activity: 8-22 mins                     [place] Office [phone] Weekend [phone]    Claretha Cooper 08/12/2022, 4:43 PM

## 2022-08-13 DIAGNOSIS — J210 Acute bronchiolitis due to respiratory syncytial virus: Secondary | ICD-10-CM | POA: Diagnosis not present

## 2022-08-13 DIAGNOSIS — J121 Respiratory syncytial virus pneumonia: Secondary | ICD-10-CM | POA: Diagnosis not present

## 2022-08-13 LAB — CULTURE, BLOOD (ROUTINE X 2)
Culture: NO GROWTH
Culture: NO GROWTH

## 2022-08-13 MED ORDER — QUETIAPINE FUMARATE 25 MG PO TABS
25.0000 mg | ORAL_TABLET | Freq: Every day | ORAL | Status: DC
  Administered 2022-08-13 – 2022-08-15 (×3): 25 mg via ORAL
  Filled 2022-08-13 (×3): qty 1

## 2022-08-13 MED ORDER — PREDNISONE 20 MG PO TABS
40.0000 mg | ORAL_TABLET | Freq: Every day | ORAL | Status: DC
  Administered 2022-08-14 – 2022-08-16 (×3): 40 mg via ORAL
  Filled 2022-08-13 (×3): qty 2

## 2022-08-13 MED ORDER — PREDNISONE 20 MG PO TABS: 40.0000 mg | ORAL_TABLET | Freq: Every day | ORAL | Status: DC

## 2022-08-13 MED ORDER — METOPROLOL TARTRATE 25 MG PO TABS
25.0000 mg | ORAL_TABLET | Freq: Two times a day (BID) | ORAL | Status: DC
  Administered 2022-08-13 – 2022-08-16 (×7): 25 mg via ORAL
  Filled 2022-08-13 (×7): qty 1

## 2022-08-13 NOTE — Progress Notes (Signed)
  Progress Note   Patient: Donna Ortega UMP:[id number] DOB: [birth year] DOA: 08/08/2022     5 DOS: the patient was seen and examined on 08/13/2022   Brief hospital course: 88 y.o. female with medical history of atrial fibrillation, coronary artery disease status post CABG, pacemaker who presents to the hospital for 4 days of progressive cough.  Her cough is so severe now that she is very uncomfortable.  Cough is congested she has no significant shortness of breath.   Assessment and Plan: Principal Problem:   RSV (acute bronchiolitis due to respiratory syncytial virus) -Chest x-ray does not suggest any pneumonia however cough is quite severe and she may have an underlying bacterial infection - cont ceftriaxone and azithromycin   - Antitussives have been ordered - cont Mucinex, flutter valve and IS -Continues to cough with audible wheezing. Added PRN neb -On 12/29, continue to desaturate on 1LNC, still needing 2L. May need supplemental O2 on d/c   Active Problems:     Atrial fibrillation, chronic with RVR    Pacemaker-Medtronic   CAD (coronary artery disease) s/p CABG - RVR likely secondary to cough and infection - resumed oral Cardizem with added metoprolol - Continue Eliquis-hold Demadex - Last echo from 2015 did not reveal any heart failure -HR remains well controlled today   Chronic kidney disease stage IIIb - Creatinine is at baseline at about 1.2 - Holding further IV fluids and hold Demadex - Cr normalized at 0.84     Hard of hearing  Osteoarthritis -complaints of acute L knee pain and swelling -L knee with palpable swelling and tenderness on palpation -Ordered and reviewed L knee xray, findings of tricompartmental osteoarthritis -Seen by Orthopedic Surgery. Pt now s/p L knee injection  Toxic metabolic encephalopathy -Acute more confused -Per family, pt has been up all night calling family members throught the night, into the morning -Will need to re-establish day/night  cycle. EKG reviewed, unremarkable QTc. Will give trial of seroquel at night      Subjective: denies anymore L knee pain  Physical Exam: Vitals:   08/12/22 1336 08/13/22 0003 08/13/22 0542 08/13/22 1345  BP: (!) 148/80 138/78 (!) 143/116 133/76  Pulse: 65 93 91 (!) 103  Resp: '18 17 17 16  '$ Temp: 98.4 F (36.9 C) (!) 97.4 F (36.3 C) 97.9 F (36.6 C) 99 F (37.2 C)  TempSrc: Oral Oral Oral Oral  SpO2: 92% 94% 95% 98%  Weight:      Height:       General exam: Conversant, in no acute distress Respiratory system: normal chest rise, clear, no audible wheezing Cardiovascular system: regular rhythm, s1-s2 Gastrointestinal system: Nondistended, nontender, pos BS Central nervous system: No seizures, no tremors Extremities: No cyanosis, no joint deformities Skin: No rashes, no pallor Psychiatry: Affect normal // no auditory hallucinations   Data Reviewed:  There are no new results to review at this time.  Family Communication: Pt in room, family not at bedside  Disposition: Status is: Inpatient Remains inpatient appropriate because: Severity of illness  Planned Discharge Destination: Skilled nursing facility    Author: Marylu Lund, MD 08/13/2022 6:21 PM  For on call review [email or web address].

## 2022-08-14 ENCOUNTER — Inpatient Hospital Stay (HOSPITAL_COMMUNITY): Payer: Medicare HMO

## 2022-08-14 DIAGNOSIS — J210 Acute bronchiolitis due to respiratory syncytial virus: Secondary | ICD-10-CM | POA: Diagnosis not present

## 2022-08-14 DIAGNOSIS — J121 Respiratory syncytial virus pneumonia: Secondary | ICD-10-CM | POA: Diagnosis not present

## 2022-08-14 LAB — COMPREHENSIVE METABOLIC PANEL
ALT: 30 U/L (ref 0–44)
AST: 30 U/L (ref 15–41)
Albumin: 3.1 g/dL — ABNORMAL LOW (ref 3.5–5.0)
Alkaline Phosphatase: 59 U/L (ref 38–126)
Anion gap: 6 (ref 5–15)
BUN: 24 mg/dL — ABNORMAL HIGH (ref 8–23)
CO2: 27 mmol/L (ref 22–32)
Calcium: 8.2 mg/dL — ABNORMAL LOW (ref 8.9–10.3)
Chloride: 107 mmol/L (ref 98–111)
Creatinine, Ser: 0.84 mg/dL (ref 0.44–1.00)
GFR, Estimated: >60 mL/min (ref >60)
Glucose, Bld: 107 mg/dL — ABNORMAL HIGH (ref 70–99)
Potassium: 4.1 mmol/L (ref 3.5–5.1)
Sodium: 140 mmol/L (ref 135–145)
Total Bilirubin: 0.6 mg/dL (ref 0.3–1.2)
Total Protein: 6.4 g/dL — ABNORMAL LOW (ref 6.5–8.1)

## 2022-08-14 LAB — CBC
HCT: 31.7 % — ABNORMAL LOW (ref 36.0–46.0)
Hemoglobin: 10.2 g/dL — ABNORMAL LOW (ref 12.0–15.0)
MCH: 31.6 pg (ref 26.0–34.0)
MCHC: 32.2 g/dL (ref 30.0–36.0)
MCV: 98.1 fL (ref 80.0–100.0)
Platelets: 208 10*3/uL (ref 150–400)
RBC: 3.23 MIL/uL — ABNORMAL LOW (ref 3.87–5.11)
RDW: 12.7 % (ref 11.5–15.5)
WBC: 6.5 10*3/uL (ref 4.0–10.5)
nRBC: 0 % (ref 0.0–0.2)

## 2022-08-14 NOTE — Progress Notes (Signed)
  Progress Note   Patient: Donna Ortega DOB: May 21, 1926 DOA: 08/08/2022     6 DOS: the patient was seen and examined on 08/14/2022   Brief hospital course: 88 y.o. female with medical history of atrial fibrillation, coronary artery disease status post CABG, pacemaker who presents to the hospital for 4 days of progressive cough.  Her cough is so severe now that she is very uncomfortable.  Cough is congested she has no significant shortness of breath.   Assessment and Plan: Principal Problem:   RSV (acute bronchiolitis due to respiratory syncytial virus) -Chest x-ray does not suggest any pneumonia however cough is quite severe and she may have an underlying bacterial infection - cont ceftriaxone and azithromycin   - Antitussives have been ordered - cont Mucinex, flutter valve and IS -Continues to cough with audible wheezing. Added PRN neb -Currently still requiring 3LNC. Prednisone added given audible wheezing -Ordered and reviewed CXR, findings of cardiomegaly and bibasilar bronchovascular crowding.    Active Problems:     Atrial fibrillation, chronic with RVR    Pacemaker-Medtronic   CAD (coronary artery disease) s/p CABG - RVR likely secondary to cough and infection - resumed oral Cardizem with added metoprolol - Continue Eliquis-hold Demadex - Last echo from 2015 with normal LVEF - Given continued O2 requirements and cardiomegaly on CXR, will repeat 2d echo   Chronic kidney disease stage IIIb - Creatinine is at baseline at about 1.2 - Holding further IV fluids and hold Demadex - Cr normalized at 0.84     Hard of hearing  Osteoarthritis -complaints of acute L knee pain and swelling -L knee with palpable swelling and tenderness on palpation -Ordered and reviewed L knee xray, findings of tricompartmental osteoarthritis -Seen by Orthopedic Surgery. Pt now s/p L knee injection  Toxic metabolic encephalopathy -Recently more confused -Per family, pt has been up  all night calling family members throught the night, into the morning -Staff reports pt slept better after trial of seroquel at night -Per RN, family noted improvement today      Subjective: Reports feeling "bad" this AM whlie actively coughing  Physical Exam: Vitals:   08/13/22 2100 08/14/22 0533 08/14/22 0730 08/14/22 1308  BP:  (!) 151/129 (!) 152/72 130/73  Pulse: 99 (!) 113 76 68  Resp:  17  18  Temp:  98 F (36.7 C)  97.6 F (36.4 C)  TempSrc:  Oral  Oral  SpO2:  95%  93%  Weight:      Height:       General exam: Awake, laying in bed, in nad Respiratory system: actively coughing, audible wheezing Cardiovascular system: regular rate, s1, s2 Gastrointestinal system: Soft, nondistended, positive BS Central nervous system: CN2-12 grossly intact, strength intact Extremities: Perfused, no clubbing Skin: Normal skin turgor, no notable skin lesions seen Psychiatry: Mood normal // no visual hallucinations   Data Reviewed:  Labs reviewed: Na 140, K 4.1, Cr 0.84, WBC 6.5, Hgb 10.2, Plts 208  Family Communication: Pt in room, family not at bedside  Disposition: Status is: Inpatient Remains inpatient appropriate because: Severity of illness  Planned Discharge Destination: Skilled nursing facility    Author: Marylu Lund, MD 08/14/2022 4:44 PM  For on call review [email or web address].

## 2022-08-14 NOTE — NC FL2 (Signed)
[place] LEVEL OF CARE FORM     IDENTIFICATION  Patient Name: Donna Ortega Birthdate: July 18, 1926 Sex: female Admission Date (Current Location): 08/08/2022  [facility] and [place] Number:  Herbalist and Address:  [facility],  [place], [place]      Provider Number: [id number]  Attending Physician Name and Address:  Donne Hazel, MD  Relative Name and Phone Number:  Khylie, [place] [phone]    Current Level of Care: Hospital Recommended Level of Care: [place] Prior Approval Number:    Date Approved/Denied:   PASRR Number: [id number]  Discharge Plan: SNF    Current Diagnoses: Patient Active Problem List   Diagnosis Date Noted   RSV (acute bronchiolitis due to respiratory syncytial virus) 08/08/2022   Hard of hearing 08/08/2022   Hypovolemia    AKI (acute kidney injury) ([place]) 04/01/2017   Small bowel obstruction ([place]) 03/31/2017   Edema 04/09/2014   Vertebral compression fracture ([place]) 10/29/2013   Compressed spine fracture ([place]) 10/29/2013   Gastric erosions 10/09/2013   Decreased hearing of both ears 10/09/2013   Normocytic anemia 06/26/2013   Osteoporosis  Fosamax restarted 10/2013 06/18/2013   Osteopenia 05/05/2013   Palpitation 02/19/2013   Insomnia 02/18/2013   Candida esophagitis ([place]) 01/22/2013   Gastric ulcer 01/22/2013   UTI (urinary tract infection) 01/17/2013   HLD (hyperlipidemia) 06/19/2012   Atrial tachycardia 05/22/2012   Pacemaker-Medtronic 02/09/2012   Sinus node dysfunction (HCC) 02/06/2012   Syncope and collapse 02/06/2012   Benign hypertension 09/27/2011   Rash 09/19/2011   UTI (lower urinary tract infection) 08/21/2011   Fatigue 08/21/2011   Peptic ulcer disease with hemorrhage 08/14/2011   S/P vertebroplasty 08/14/2011   H/O Spinal surgery 08/14/2011   Chronic kidney disease (CKD), stage III (moderate) ([place]) 08/05/2011   Anemia associated with acute  blood loss 08/04/2011   Hyponatremia 08/04/2011   GI bleed 08/04/2011   Dyslipidemia 08/04/2011   Melena 08/03/2011   CAD (coronary artery disease) s/p CABG 01/11/2011   Atrial fibrillation, chronic ([place]) 01/11/2011   HTN (hypertension) 01/11/2011   Arteriosclerosis of coronary artery 01/11/2011    Orientation RESPIRATION BLADDER Height & Weight     Self, Place  O2 Continent Weight: 134 lb (60.8 kg) Height:  '5\' 4"'$  (162.6 cm)  BEHAVIORAL SYMPTOMS/MOOD NEUROLOGICAL BOWEL NUTRITION STATUS      Continent Diet (Regular)  AMBULATORY STATUS COMMUNICATION OF NEEDS Skin   Limited Assist Verbally Normal                       Personal Care Assistance Level of Assistance  Bathing, Feeding, Dressing Bathing Assistance: Limited assistance Feeding assistance: Independent Dressing Assistance: Limited assistance     Functional Limitations Info  Sight, Hearing, Speech Sight Info: Adequate Hearing Info: Impaired Speech Info: Adequate    SPECIAL CARE FACTORS FREQUENCY  PT (By licensed PT), OT (By licensed OT)     PT Frequency: 5x/wk OT Frequency: 5x/wk            Contractures Contractures Info: Not present    Additional Factors Info  Code Status, Allergies Code Status Info: FULL Allergies Info: Amiodarone, Aspirin, Atorvastatin, Lipitor (Atorvastatin Calcium), Nsaids, Tolmetin, Naproxen, Niacin, Niaspan (Niacin Er)           Current Medications (08/14/2022):  This is the current hospital active medication list Current Facility-Administered Medications  Medication Dose Route Frequency Provider Last Rate Last Admin   acetaminophen (TYLENOL)  tablet 650 mg  650 mg Oral Q6H PRN Debbe Odea, MD   650 mg at 08/11/22 2211   Or   acetaminophen (TYLENOL) suppository 650 mg  650 mg Rectal Q6H PRN Debbe Odea, MD       apixaban (ELIQUIS) tablet 5 mg  5 mg Oral BID Debbe Odea, MD   5 mg at 08/14/22 0459   chlorpheniramine-HYDROcodone (TUSSIONEX) 10-8 MG/5ML suspension 5 mL   5 mL Oral Q12H Rizwan, [name], MD   5 mL at 08/14/22 9774   dextromethorphan-guaiFENesin ([place] DM) 30-600 MG per 12 hr tablet 1 tablet  1 tablet Oral BID Debbe Odea, MD   1 tablet at 08/14/22 1423   diltiazem (CARDIZEM CD) 24 hr capsule 360 mg  360 mg Oral Daily Rizwan, [name], MD   360 mg at 08/14/22 0821   ipratropium-albuterol (DUONEB) 0.5-2.5 (3) MG/3ML nebulizer solution 3 mL  3 mL Nebulization Q4H PRN Donne Hazel, MD       metoprolol tartrate (LOPRESSOR) tablet 25 mg  25 mg Oral BID Donne Hazel, MD   25 mg at 08/14/22 0822   ondansetron (ZOFRAN) tablet 4 mg  4 mg Oral Q6H PRN Debbe Odea, MD       Or   ondansetron (ZOFRAN) injection 4 mg  4 mg Intravenous Q6H PRN Rizwan, [name], MD       predniSONE (DELTASONE) tablet 40 mg  40 mg Oral Q breakfast Donne Hazel, MD   40 mg at 08/14/22 9532   QUEtiapine (SEROQUEL) tablet 25 mg  25 mg Oral QHS Donne Hazel, MD   25 mg at 08/13/22 2314     Discharge Medications: Please see discharge summary for a list of discharge medications.  Relevant Imaging Results:  Relevant Lab Results:   Additional Information ssn:[id number]. [id number]  Vassie Moselle, LCSW

## 2022-08-14 NOTE — TOC Progression Note (Signed)
Transition of Care [facility]) - Progression Note    Patient Details  Name: Donna Ortega MRN: [id number] Date of Birth: 1926-03-20  Transition of Care [facility]) CM/SW [place], [place] Phone Number: 08/14/2022, 4:43 PM  Clinical Narrative:    Spoke with pt's son via t/c who reports having concerns with pt returning home at current state and is requesting SNF placement prior to pt returning home.  Referrals have been sent for SNF placement and currently awaiting bed offers.  Expected Discharge Plan: [place] Barriers to Discharge: No Barriers Identified  Expected Discharge Plan and Services In-house Referral: NA Discharge Planning Services: NA Post Acute Care Choice: Home Health, Durable Medical Equipment Living arrangements for the past 2 months: [place]: PT [place] Agency: 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) Date HH Agency Contacted: 08/10/22 Time [place]: 4098 Representative spoke with at [place]: [place] ([place]) Interventions [place]: No Food Insecurity (08/08/2022)  Housing: Low Risk  (08/08/2022)  Transportation Needs: No Transportation Needs (08/08/2022)  Utilities: Not At Risk (08/08/2022)  Tobacco Use: Low Risk  (08/08/2022)    Readmission Risk Interventions    08/10/2022    1:55 PM  Readmission Risk Prevention Plan  Transportation Screening Complete  PCP or Specialist Appt within 5-7 Days Complete  Home Care Screening Complete  Medication Review (RN CM) Complete

## 2022-08-15 ENCOUNTER — Inpatient Hospital Stay (HOSPITAL_COMMUNITY): Payer: Medicare HMO

## 2022-08-15 DIAGNOSIS — I5031 Acute diastolic (congestive) heart failure: Secondary | ICD-10-CM | POA: Diagnosis not present

## 2022-08-15 DIAGNOSIS — J121 Respiratory syncytial virus pneumonia: Secondary | ICD-10-CM | POA: Diagnosis not present

## 2022-08-15 DIAGNOSIS — J210 Acute bronchiolitis due to respiratory syncytial virus: Secondary | ICD-10-CM | POA: Diagnosis not present

## 2022-08-15 LAB — COMPREHENSIVE METABOLIC PANEL
ALT: 31 U/L (ref 0–44)
AST: 28 U/L (ref 15–41)
Albumin: 3.3 g/dL — ABNORMAL LOW (ref 3.5–5.0)
Alkaline Phosphatase: 61 U/L (ref 38–126)
Anion gap: 8 (ref 5–15)
BUN: 29 mg/dL — ABNORMAL HIGH (ref 8–23)
CO2: 25 mmol/L (ref 22–32)
Calcium: 8.2 mg/dL — ABNORMAL LOW (ref 8.9–10.3)
Chloride: 104 mmol/L (ref 98–111)
Creatinine, Ser: 1 mg/dL (ref 0.44–1.00)
GFR, Estimated: 52 mL/min — ABNORMAL LOW (ref >60)
Glucose, Bld: 109 mg/dL — ABNORMAL HIGH (ref 70–99)
Potassium: 4.3 mmol/L (ref 3.5–5.1)
Sodium: 137 mmol/L (ref 135–145)
Total Bilirubin: 0.7 mg/dL (ref 0.3–1.2)
Total Protein: 6.7 g/dL (ref 6.5–8.1)

## 2022-08-15 LAB — CBC
HCT: 33.8 % — ABNORMAL LOW (ref 36.0–46.0)
Hemoglobin: 10.7 g/dL — ABNORMAL LOW (ref 12.0–15.0)
MCH: 31.6 pg (ref 26.0–34.0)
MCHC: 31.7 g/dL (ref 30.0–36.0)
MCV: 99.7 fL (ref 80.0–100.0)
Platelets: 241 10*3/uL (ref 150–400)
RBC: 3.39 MIL/uL — ABNORMAL LOW (ref 3.87–5.11)
RDW: 12.6 % (ref 11.5–15.5)
WBC: 8.6 10*3/uL (ref 4.0–10.5)
nRBC: 0 % (ref 0.0–0.2)

## 2022-08-15 LAB — ECHOCARDIOGRAM COMPLETE
Area-P 1/2: 4.6 cm2
Calc EF: 53.1 %
Height: 64 in
P 1/2 time: 357 msec
S' Lateral: 3.3 cm
Single Plane A2C EF: 55.1 %
Single Plane A4C EF: 48.7 %
Weight: 2144 oz

## 2022-08-15 LAB — BRAIN NATRIURETIC PEPTIDE: B Natriuretic Peptide: 337.3 pg/mL — ABNORMAL HIGH (ref 0.0–100.0)

## 2022-08-15 NOTE — Progress Notes (Signed)
  Progress Note   Patient: Donna Ortega PYP:[id number] DOB: Jun 19, 1926 DOA: 08/08/2022     7 DOS: the patient was seen and examined on 08/15/2022   Brief hospital course: 89 y.o. female with medical history of atrial fibrillation, coronary artery disease status post CABG, pacemaker who presents to the hospital for 4 days of progressive cough.  Her cough is so severe now that she is very uncomfortable.  Cough is congested she has no significant shortness of breath.   Assessment and Plan: Principal Problem:   RSV (acute bronchiolitis due to respiratory syncytial virus) -Chest x-ray does not suggest any pneumonia however cough is quite severe and she may have an underlying bacterial infection - cont ceftriaxone and azithromycin   - Antitussives have been ordered - cont Mucinex, flutter valve and IS -Continues to cough with audible wheezing. Added PRN neb -Weaned down to [facility]. Prednisone recently added given audible wheezing -Ordered and reviewed CXR, findings of cardiomegaly and bibasilar bronchovascular crowding.  -This AM, much better air movement and clarity/forcefulness of voice   Active Problems:     Atrial fibrillation, chronic with RVR    Pacemaker-Medtronic   CAD (coronary artery disease) s/p CABG - RVR likely secondary to cough and infection - resumed oral Cardizem with added metoprolol - Continue Eliquis-hold Demadex - Last echo from 2015 with normal LVEF - Given continued O2 requirements and cardiomegaly on CXR -Repeat echo reviewed with findings of normal LVEF, elevated pulm artery pressure, moderately dilated LA, mildly dilated RA, mod TR, mild LVH   Chronic kidney disease stage IIIb - Creatinine is at baseline at about 1.2 - Holding further IV fluids and hold Demadex - Cr normalized     Hard of hearing  Osteoarthritis -complaints of acute L knee pain and swelling -L knee with palpable swelling and tenderness on palpation -Ordered and reviewed L knee xray, findings of  tricompartmental osteoarthritis -Seen by Orthopedic Surgery. Pt now s/p L knee injection, symptoms improved  Toxic metabolic encephalopathy -Recently more confused -Per family, pt has been up all night calling family members throught the night, into the morning -Staff reports pt slept better after trial of seroquel at night, will continue -Mentation does seem further improved today      Subjective: Reports feeling better today  Physical Exam: Vitals:   08/14/22 1308 08/14/22 2119 08/15/22 0436 08/15/22 1253  BP: 130/73 (!) 128/58 (!) 152/75 126/63  Pulse: 68 72 69 64  Resp: '18 16 18 16  '$ Temp: 97.6 F (36.4 C) 98.5 F (36.9 C) (!) 97.5 F (36.4 C) 97.9 F (36.6 C)  TempSrc: Oral Oral Oral Oral  SpO2: 93% 96% 100% 97%  Weight:      Height:       General exam: Conversant, in no acute distress Respiratory system: normal chest rise, clear, no audible wheezing Cardiovascular system: regular rhythm, s1-s2 Gastrointestinal system: Nondistended, nontender, pos BS Central nervous system: No seizures, no tremors Extremities: No cyanosis, no joint deformities Skin: No rashes, no pallor Psychiatry: Affect normal // no auditory hallucinations   Data Reviewed:  Labs reviewed: Na 137, K 4.3, Cr 1.00, WBC 8.6, Hgb 10.7, Plts 241  Family Communication: Pt in room, family not at bedside  Disposition: Status is: Inpatient Remains inpatient appropriate because: Severity of illness  Planned Discharge Destination: Skilled nursing facility    Author: Marylu Lund, MD 08/15/2022 4:33 PM  For on call review [email or web address].

## 2022-08-15 NOTE — Care Management Important Message (Signed)
Important Message  Patient Details IM Letter given. Name: MICCA MATURA MRN: [id number] Date of Birth: 24-Jun-1926   Medicare Important Message Given:  Yes     Kerin Salen 08/15/2022, 10:58 AM

## 2022-08-16 DIAGNOSIS — J121 Respiratory syncytial virus pneumonia: Secondary | ICD-10-CM | POA: Diagnosis not present

## 2022-08-16 DIAGNOSIS — J210 Acute bronchiolitis due to respiratory syncytial virus: Secondary | ICD-10-CM | POA: Diagnosis not present

## 2022-08-16 MED ORDER — METOPROLOL TARTRATE 25 MG PO TABS: 25.0000 mg | ORAL_TABLET | Freq: Two times a day (BID) | ORAL | 0 refills | Status: DC

## 2022-08-16 MED ORDER — DM-GUAIFENESIN ER 30-600 MG PO TB12: 1.0000 | ORAL_TABLET | Freq: Two times a day (BID) | ORAL | 0 refills | Status: DC

## 2022-08-16 MED ORDER — ALBUTEROL SULFATE HFA 108 (90 BASE) MCG/ACT IN AERS: 2.0000 | INHALATION_SPRAY | Freq: Four times a day (QID) | RESPIRATORY_TRACT | 0 refills | Status: AC | PRN

## 2022-08-16 MED ORDER — PREDNISONE 10 MG PO TABS: ORAL_TABLET | ORAL | 0 refills | Status: AC

## 2022-08-16 NOTE — TOC Progression Note (Signed)
Transition of Care [facility]) - Progression Note    Patient Details  Name: Donna Ortega MRN: [id number] Date of Birth: 27-Aug-1925  Transition of Care [facility]) CM/SW [place], [place] Name Phone Number: 08/16/2022, 10:23 AM  Clinical Narrative:    Spoke with pt's son via t/c to review bed offer for SNF. Pt only has one SNF bed offer and pt's son does not want pt to go to that SNF. Pt's son would like pt to return to [place] w/ [facility] through [place]. HH order will need to be faxed to [facility] at [phone] prior to discharge.    Expected Discharge Plan: [place] Barriers to Discharge: No Barriers Identified  Expected Discharge Plan and Services In-house Referral: NA Discharge Planning Services: NA Post Acute Care Choice: Home Health, Durable Medical Equipment Living arrangements for the past 2 months: [place]: PT [place] Agency: 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) Date HH Agency Contacted: 08/10/22 Time [place]: 1173 Representative spoke with at [place]: [place] ([place]) Interventions [place]: No Food Insecurity (08/08/2022)  Housing: Low Risk  (08/08/2022)  Transportation Needs: No Transportation Needs (08/08/2022)  Utilities: Not At Risk (08/08/2022)  Tobacco Use: Low Risk  (08/08/2022)    Readmission Risk Interventions    08/10/2022    1:55 PM  Readmission Risk Prevention Plan  Transportation Screening Complete  PCP or Specialist Appt within 5-7 Days Complete  Home Care Screening Complete  Medication Review (RN CM) Complete

## 2022-08-16 NOTE — TOC Transition Note (Signed)
Transition of Care [facility]) - CM/SW Discharge Note   Patient Details  Name: Donna Ortega MRN: [id number] Date of Birth: 08/01/1926  [facility]) CM/SW Contact:  Vassie Moselle, LCSW Phone Number: 08/16/2022, 1:49 PM   Clinical Narrative:    Pt is to return to [place]. Pt will be receiving HHPT through [facility]. Home O2 has been ordered through [facility] and O2 will be delivered to pt's room prior to discharge. Pt's son agreeable with discharge plan and is to provide transportation home.    Final next level of care: Home w Home Health Services Barriers to Discharge: No Barriers Identified   Patient Goals and CMS Choice CMS Medicare.gov Compare Post Acute Care list provided to:: Patient Represenative (must comment) Choice offered to / list presented to : Adult Children  Discharge Placement                Patient chooses bed at: [facility] Patient to be transferred to facility by: Son Name of family member notified: Ethelmae Ringel Patient and family notified of of transfer: 08/16/22  Discharge Plan and Services Additional resources added to the After Visit Summary for   In-house Referral: NA Discharge Planning Services: NA Post Acute Care Choice: [place], Durable Medical Equipment          DME Arranged: Oxygen DME Agency: [facility] [employer] Date DME Agency Contacted: 08/16/22 Time DME Agency Contacted: [id number] Representative spoke with at DME Agency: Brenton Grills HH Arranged: PT [place]: 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) Date [place]: 08/10/22 Time [place]: [id number] Representative spoke with at [place]: [place] ([place]) Interventions [place]: No Food Insecurity (08/08/2022)  Housing: Low Risk  (08/08/2022)  Transportation Needs: No Transportation Needs (08/08/2022)  Utilities: Not At Risk (08/08/2022)  Tobacco Use: Low Risk  (08/08/2022)     Readmission Risk  Interventions    08/16/2022    1:48 PM 08/10/2022    1:55 PM  Readmission Risk Prevention Plan  Transportation Screening Complete Complete  PCP or Specialist Appt within 5-7 Days Complete Complete  Home Care Screening Complete Complete  Medication Review (RN CM) Complete Complete

## 2022-08-16 NOTE — Progress Notes (Signed)
SATURATION QUALIFICATIONS: (This note is used to comply with regulatory documentation for home oxygen)  Patient Saturations on Room Air at Rest = 96%  Patient Saturations on Room Air while Ambulating = 87%  Patient Saturations on 2 Liters of oxygen while Ambulating = 95%  Please briefly explain why patient needs home oxygen: To maintain saturation during functional mobility. [place] Office [phone] Weekend [phone]

## 2022-08-16 NOTE — Discharge Summary (Signed)
Physician Discharge Summary   Patient: Donna Ortega MRN: [id number] DOB: 1926/04/02  Admit date:     08/08/2022  Discharge date: 08/16/22  Discharge Physician: Marylu Lund   PCP: Patient, No Pcp Per   Recommendations at discharge:    Follow up with PCP in 1-2 weeks  Discharge Diagnoses: Principal Problem:   RSV (acute bronchiolitis due to respiratory syncytial virus) Active Problems:   CAD (coronary artery disease) s/p CABG   Atrial fibrillation, chronic (HCC)   HTN (hypertension)   Pacemaker-Medtronic   Hard of hearing  Resolved Problems:   * No resolved hospital problems. *  Hospital Course: 89 y.o. female with medical history of atrial fibrillation, coronary artery disease status post CABG, pacemaker who presents to the hospital for 4 days of progressive cough.  Her cough is so severe now that she is very uncomfortable.  Cough is congested she has no significant shortness of breath.   Assessment and Plan: Principal Problem:   RSV (acute bronchiolitis due to respiratory syncytial virus) -Chest x-ray does not suggest any pneumonia however cough is quite severe and she may have an underlying bacterial infection - completed course of ceftriaxone and azithromycin   - Antitussives have been ordered - cont Mucinex, flutter valve and IS -Weaned down to [facility]. Prednisone recently added given audible wheezing -Ordered and reviewed CXR, findings of cardiomegaly and bibasilar bronchovascular crowding.  -Now with much better air movement. Still needing O2 on ambulation   Active Problems:     Atrial fibrillation, chronic with RVR    Pacemaker-Medtronic   CAD (coronary artery disease) s/p CABG - RVR likely secondary to cough and infection initially - resumed oral Cardizem with added metoprolol - Continue Eliquis-hold Demadex - Last echo from 2015 with normal LVEF - Given continued O2 requirements and cardiomegaly on CXR -Repeat echo reviewed with findings of normal LVEF,  elevated pulm artery pressure, moderately dilated LA, mildly dilated RA, mod TR, mild LVH   Chronic kidney disease stage IIIb - Creatinine is at baseline at about 1.2 - Cr normalized     Hard of hearing   Osteoarthritis -complaints of acute L knee pain and swelling -L knee with palpable swelling and tenderness on palpation -Ordered and reviewed L knee xray, findings of tricompartmental osteoarthritis -Seen by Orthopedic Surgery. Pt now s/p L knee injection, symptoms improved   Toxic metabolic encephalopathy -Recently more confused -Per family, pt has been up all night calling family members throught the night, into the morning -Staff reports pt slept better after trial of seroquel at night, will continue -Mentation does seem much improved and at baseline -Recommend maintaining adequate sleep hygiene        Consultants:  Procedures performed:   Disposition:  Independent living Diet recommendation:  Regular diet DISCHARGE MEDICATION: Allergies as of 08/16/2022       Reactions   Amiodarone Hives, Other (See Comments)   ALL OVER BODY HIVES/ ITCH other   Aspirin Other (See Comments)   Gi bleed other   Atorvastatin Other (See Comments)   MUSCLE ACHES other   Lipitor [atorvastatin Calcium] Other (See Comments)   MUSCLE ACHES   Nsaids Other (See Comments)   GI bleed   Tolmetin Other (See Comments)   GI bleed other   Naproxen Other (See Comments)   Niacin Rash, Other (See Comments)   other   Niaspan [niacin Er] Rash        Medication List     STOP taking these medications  doxycycline 100 MG capsule Commonly known as: VIBRAMYCIN   potassium chloride 10 MEQ tablet Commonly known as: KLOR-CON       TAKE these medications    albuterol 108 (90 Base) MCG/ACT inhaler Commonly known as: VENTOLIN HFA Inhale 2 puffs into the lungs every 6 (six) hours as needed for wheezing or shortness of breath.   dextromethorphan-guaiFENesin 30-600 MG 12hr tablet Commonly  known as: MUCINEX DM Take 1 tablet by mouth 2 (two) times daily.   diltiazem 360 MG 24 hr capsule Commonly known as: CARDIZEM CD TAKE ONE CAPSULE BY MOUTH DAILY. What changed:  how much to take how to take this when to take this additional instructions   diphenhydramine-acetaminophen 25-500 MG Tabs tablet Commonly known as: TYLENOL PM Take 2 tablets by mouth at bedtime.   Eliquis 5 MG Tabs tablet Generic drug: apixaban TAKE ONE TABLET BY MOUTH TWICE A DAY What changed: how much to take   metoprolol tartrate 25 MG tablet Commonly known as: LOPRESSOR Take 1 tablet (25 mg total) by mouth 2 (two) times daily.   predniSONE 10 MG tablet Commonly known as: DELTASONE Take 4 tablets (40 mg total) by mouth daily with breakfast for 2 days, THEN 2 tablets (20 mg total) daily with breakfast for 2 days, THEN 1 tablet (10 mg total) daily with breakfast for 2 days, THEN 0.5 tablets (5 mg total) daily with breakfast for 2 days. Start taking on: August 16, 2022   Prolia 60 MG/ML Sosy injection Generic drug: denosumab Inject 60 mg into the skin every 6 (six) months.   rosuvastatin 10 MG tablet Commonly known as: CRESTOR TAKE ONE TABLET BY MOUTH DAILY   torsemide 20 MG tablet Commonly known as: DEMADEX TAKE ONE TABLET BY MOUTH DAILY What changed:  when to take this additional instructions   Vitamin C 500 MG Caps Take 1 tablet by mouth daily.               Durable Medical Equipment  (From admission, onward)           Start     Ordered   08/16/22 1312  For home use only DME oxygen  Once       Question Answer Comment  Length of Need 6 Months   Mode or (Route) Nasal cannula   Liters per Minute 2   Frequency Continuous (stationary and portable oxygen unit needed)   Oxygen delivery system Gas      08/16/22 1311            Discharge Exam: Filed Weights   08/08/22 1341  Weight: 60.8 kg   General exam: Awake, laying in bed, in nad Respiratory system: Normal  respiratory effort, no wheezing Cardiovascular system: regular rate, s1, s2 Gastrointestinal system: Soft, nondistended, positive BS Central nervous system: CN2-12 grossly intact, strength intact Extremities: Perfused, no clubbing Skin: Normal skin turgor, no notable skin lesions seen Psychiatry: Mood normal // no visual hallucinations   Condition at discharge: fair  The results of significant diagnostics from this hospitalization (including imaging, microbiology, ancillary and laboratory) are listed below for reference.   Imaging Studies: ECHOCARDIOGRAM COMPLETE  Result Date: 08/15/2022    ECHOCARDIOGRAM REPORT   Patient Name:   KEYLI DUROSS Date of Exam: 08/15/2022 Medical Rec #:  [id number]    Height:       64.0 in Accession #:    [id number]   Weight:       134.0 lb Date of Birth:  [birth year]  BSA:          1.650 m Patient Age:    36 years     BP:           152/75 mmHg Patient Gender: F            HR:           74 bpm. Exam Location:  [address] Procedure: 2D Echo, Cardiac Doppler and Color Doppler Indications:    CHF-Acute Diastolic Q65.78  History:        Patient has prior history of Echocardiogram examinations, most                 recent 02/28/2014. CAD, Pacemaker, Arrythmias:Atrial                 Fibrillation; Risk Factors:Hypertension.  Sonographer:    Luane School RDCS Referring Phys: [place]  1. Left ventricular ejection fraction, by estimation, is 50 to 55%. The left ventricle has low normal function. The left ventricle has no regional wall motion abnormalities. There is mild left ventricular hypertrophy. Left ventricular diastolic parameters are indeterminate.  2. Right ventricular systolic function is mildly reduced. The right ventricular size is normal. There is mildly elevated pulmonary artery systolic pressure. The estimated right ventricular systolic pressure is 46.9 mmHg.  3. Left atrial size was moderately dilated.  4. Right atrial size was mildly dilated.  5. The  mitral valve is normal in structure. Trivial mitral valve regurgitation. No evidence of mitral stenosis.  6. Tricuspid valve regurgitation is moderate.  7. The aortic valve is tricuspid. Aortic valve regurgitation is mild. No aortic stenosis is present.  8. The inferior vena cava is dilated in size with >50% respiratory variability, suggesting right atrial pressure of 8 mmHg. FINDINGS  Left Ventricle: Left ventricular ejection fraction, by estimation, is 50 to 55%. The left ventricle has low normal function. The left ventricle has no regional wall motion abnormalities. The left ventricular internal cavity size was normal in size. There is mild left ventricular hypertrophy. Left ventricular diastolic parameters are indeterminate. Right Ventricle: The right ventricular size is normal. Right vetricular wall thickness was not well visualized. Right ventricular systolic function is mildly reduced. There is mildly elevated pulmonary artery systolic pressure. The tricuspid regurgitant velocity is 2.72 m/s, and with an assumed right atrial pressure of 8 mmHg, the estimated right ventricular systolic pressure is 62.9 mmHg. Left Atrium: Left atrial size was moderately dilated. Right Atrium: Right atrial size was mildly dilated. Pericardium: There is no evidence of pericardial effusion. Mitral Valve: The mitral valve is normal in structure. Trivial mitral valve regurgitation. No evidence of mitral valve stenosis. Tricuspid Valve: The tricuspid valve is normal in structure. Tricuspid valve regurgitation is moderate. Aortic Valve: The aortic valve is tricuspid. Aortic valve regurgitation is mild. Aortic regurgitation PHT measures 357 msec. No aortic stenosis is present. Pulmonic Valve: The pulmonic valve was grossly normal. Pulmonic valve regurgitation is not visualized. Aorta: The aortic root is normal in size and structure. Venous: The inferior vena cava is dilated in size with greater than 50% respiratory variability,  suggesting right atrial pressure of 8 mmHg. IAS/Shunts: The interatrial septum was not well visualized.  LEFT VENTRICLE PLAX 2D LVIDd:         4.60 cm     Diastology LVIDs:         3.30 cm     LV e' medial:    7.72 cm/s LV PW:  1.00 cm     LV E/e' medial:  15.8 LV IVS:        0.90 cm     LV e' lateral:   9.79 cm/s LVOT diam:     1.90 cm     LV E/e' lateral: 12.5 LV SV:         65 LV SV Index:   40 LVOT Area:     2.84 cm  LV Volumes (MOD) LV vol d, MOD A2C: 51.5 ml LV vol d, MOD A4C: 53.2 ml LV vol s, MOD A2C: 23.1 ml LV vol s, MOD A4C: 27.3 ml LV SV MOD A2C:     28.4 ml LV SV MOD A4C:     53.2 ml LV SV MOD BP:      28.7 ml RIGHT VENTRICLE            IVC RV S prime:     8.26 cm/s  IVC diam: 2.20 cm TAPSE (M-mode): 1.1 cm LEFT ATRIUM             Index        RIGHT ATRIUM           Index LA diam:        4.80 cm 2.91 cm/m   RA Area:     20.30 cm LA Vol (A2C):   68.4 ml 41.45 ml/m  RA Volume:   49.80 ml  30.18 ml/m LA Vol (A4C):   75.5 ml 45.75 ml/m LA Biplane Vol: 71.9 ml 43.57 ml/m  AORTIC VALVE LVOT Vmax:   109.00 cm/s LVOT Vmean:  74.800 cm/s LVOT VTI:    0.230 m AI PHT:      357 msec  AORTA Ao Root diam: 3.50 cm Ao Asc diam:  3.60 cm Ao Desc diam: 1.50 cm MITRAL VALVE                TRICUSPID VALVE MV Area (PHT): 4.60 cm     TR Peak grad:   29.6 mmHg MV Decel Time: 165 msec     TR Vmax:        272.00 cm/s MV E velocity: 122.00 cm/s                             SHUNTS                             Systemic VTI:  0.23 m                             Systemic Diam: 1.90 cm Oswaldo Milian MD Electronically signed by Oswaldo Milian MD Signature Date/Time: 08/15/2022/10:20:35 AM    Final    DG CHEST PORT 1 VIEW  Result Date: 08/14/2022 CLINICAL DATA:  Cough and shortness of breath. History of bronchiolitis with RSV. Evaluate for pneumonia. EXAM: PORTABLE CHEST 1 VIEW COMPARISON:  AP chest 12/07/2021 FINDINGS: Left chest wall cardiac pacer is again seen with leads overlying the right atrium and  right ventricle. Status post median sternotomy. Cardiac silhouette is again moderately enlarged. Mediastinal contours are within normal limits with mild calcification within aortic arch. Mild bibasilar bronchovascular crowding but otherwise clear lungs, unchanged from prior. No definite pleural effusion. No pneumothorax. Diffuse decreased bone mineralization. No acute skeletal abnormality. IMPRESSION: Cardiomegaly and bibasilar bronchovascular crowding, unchanged from prior. No acute lung process. Electronically Signed  By: Yvonne Kendall M.D.   On: 08/14/2022 12:38   VAS [place] LOWER EXTREMITY VENOUS (DVT)  Result Date: 08/11/2022  Lower Venous DVT Study Patient Name:  NEL STONEKING  Date of Exam:   08/11/2022 Medical Rec #: [id number]     Accession #:    [id number] Date of Birth: May 30, 1926     Patient Gender: F Patient Age:   89 years Exam Location[facility]  [facility] Procedure:      VAS [place] LOWER EXTREMITY VENOUS (DVT) Referring Phys: Magnum Lunde --------------------------------------------------------------------------------  Indications: Phlebitis, and Pain.  Risk Factors: None identified. Anticoagulation: Eliquis. Limitations: Poor ultrasound/tissue interface, body habitus and patient positioning, patient immobility. Comparison Study: No prior studies. Performing Technologist: Oliver Hum RVT  Examination Guidelines: A complete evaluation includes B-mode imaging, spectral Doppler, color Doppler, and power Doppler as needed of all accessible portions of each vessel. Bilateral testing is considered an integral part of a complete examination. Limited examinations for reoccurring indications may be performed as noted. The reflux portion of the exam is performed with the patient in reverse Trendelenburg.  +-----+---------------+---------+-----------+----------+--------------+ RIGHTCompressibilityPhasicitySpontaneityPropertiesThrombus Aging  +-----+---------------+---------+-----------+----------+--------------+ CFV  Full           Yes      Yes                                 +-----+---------------+---------+-----------+----------+--------------+   +---------+---------------+---------+-----------+----------+-------------------+ LEFT     CompressibilityPhasicitySpontaneityPropertiesThrombus Aging      +---------+---------------+---------+-----------+----------+-------------------+ CFV      Full           Yes      Yes                                      +---------+---------------+---------+-----------+----------+-------------------+ SFJ      Full                                                             +---------+---------------+---------+-----------+----------+-------------------+ FV Prox  Full                                                             +---------+---------------+---------+-----------+----------+-------------------+ FV Mid   Full                                                             +---------+---------------+---------+-----------+----------+-------------------+ FV Distal               Yes      Yes                                      +---------+---------------+---------+-----------+----------+-------------------+ PFV      Full                                                             +---------+---------------+---------+-----------+----------+-------------------+  POP      Full           Yes      Yes                                      +---------+---------------+---------+-----------+----------+-------------------+ PTV      Full                                                             +---------+---------------+---------+-----------+----------+-------------------+ PERO                                                  Not well visualized +---------+---------------+---------+-----------+----------+-------------------+     Summary: RIGHT: - No  evidence of common femoral vein obstruction.  LEFT: - There is no evidence of deep vein thrombosis in the lower extremity. However, portions of this examination were limited- see technologist comments above.  - No cystic structure found in the popliteal fossa.  *See table(s) above for measurements and observations. Electronically signed by Harold Barban MD on 08/11/2022 at 5:24:26 PM.    Final    DG Knee 1-2 Views Left  Result Date: 08/11/2022 CLINICAL DATA:  Left knee pain EXAM: LEFT KNEE - 1-2 VIEW COMPARISON:  None Available. FINDINGS: Severe lateral, mild-to-moderate medial, and moderate to severe patellofemoral joint space narrowing. Mild superior patellar degenerative osteophytosis. Mild peripheral lateral compartment degenerative osteophytosis. No joint effusion. No acute fracture or dislocation. Mild-to-moderate atherosclerotic vascular calcifications. IMPRESSION: Tricompartmental osteoarthritis, greatest within the lateral and patellofemoral compartments. Electronically Signed   By: Yvonne Kendall M.D.   On: 08/11/2022 15:04   DG Chest Portable 1 View  Result Date: 08/08/2022 CLINICAL DATA:  Possible sepsis, cough EXAM: PORTABLE CHEST 1 VIEW COMPARISON:  01/03/2021 FINDINGS: Transverse diameter of heart is increased. Pacemaker battery is seen in left infraclavicular region. There is evidence of previous cardiac surgery. Lung fields are clear of any infiltrates or pulmonary edema. There is no pleural effusion or pneumothorax. IMPRESSION: There are no signs of pulmonary edema or focal pulmonary consolidation. Electronically Signed   By: Elmer Picker M.D.   On: 08/08/2022 14:32    Microbiology: Results for orders placed or performed during the hospital encounter of 08/08/22  Resp panel by RT-PCR (RSV, Flu A&B, Covid) Anterior Nasal Swab     Status: Abnormal   Collection Time: 08/08/22  1:59 PM   Specimen: Anterior Nasal Swab  Result Value Ref Range Status   SARS Coronavirus 2 by RT PCR  NEGATIVE NEGATIVE Final    Comment: (NOTE) SARS-CoV-2 target nucleic acids are NOT DETECTED.  The SARS-CoV-2 RNA is generally detectable in upper respiratory specimens during the acute phase of infection. The lowest concentration of SARS-CoV-2 viral copies this assay can detect is 138 copies/mL. A negative result does not preclude SARS-Cov-2 infection and should not be used as the sole basis for treatment or other patient management decisions. A negative result may occur with  improper specimen collection/handling, submission of specimen other than nasopharyngeal swab, presence of viral mutation(s) within the areas targeted by this assay, and inadequate number  of viral copies(<138 copies/mL). A negative result must be combined with clinical observations, patient history, and epidemiological information. The expected result is Negative.  Fact Sheet for Patients:  EntrepreneurPulse.com.au  Fact Sheet for Healthcare Providers:  IncredibleEmployment.be  This test is no t yet approved or cleared by the [place] FDA and  has been authorized for detection and/or diagnosis of SARS-CoV-2 by FDA under an Emergency Use Authorization (EUA). This EUA will remain  in effect (meaning this test can be used) for the duration of the COVID-19 declaration under Section 564(b)(1) of the Act, 21 U.S.C.section 360bbb-3(b)(1), unless the authorization is terminated  or revoked sooner.       Influenza A by PCR NEGATIVE NEGATIVE Final   Influenza B by PCR NEGATIVE NEGATIVE Final    Comment: (NOTE) The Xpert Xpress SARS-CoV-2/FLU/RSV plus assay is intended as an aid in the diagnosis of influenza from Nasopharyngeal swab specimens and should not be used as a sole basis for treatment. Nasal washings and aspirates are unacceptable for Xpert Xpress SARS-CoV-2/FLU/RSV testing.  Fact Sheet for Patients: EntrepreneurPulse.com.au  Fact Sheet for  Healthcare Providers: IncredibleEmployment.be  This test is not yet approved or cleared by the [place] FDA and has been authorized for detection and/or diagnosis of SARS-CoV-2 by FDA under an Emergency Use Authorization (EUA). This EUA will remain in effect (meaning this test can be used) for the duration of the COVID-19 declaration under Section 564(b)(1) of the Act, 21 U.S.C. section 360bbb-3(b)(1), unless the authorization is terminated or revoked.     Resp Syncytial Virus by PCR POSITIVE (A) NEGATIVE Final    Comment: (NOTE) Fact Sheet for Patients: EntrepreneurPulse.com.au  Fact Sheet for Healthcare Providers: IncredibleEmployment.be  This test is not yet approved or cleared by the [place] FDA and has been authorized for detection and/or diagnosis of SARS-CoV-2 by FDA under an Emergency Use Authorization (EUA). This EUA will remain in effect (meaning this test can be used) for the duration of the COVID-19 declaration under Section 564(b)(1) of the Act, 21 U.S.C. section 360bbb-3(b)(1), unless the authorization is terminated or revoked.  Performed at [facility], [place] [address] [place], [place] [address]   Urine Culture     Status: Abnormal   Collection Time: 08/08/22  1:59 PM   Specimen: In/Out Cath Urine  Result Value Ref Range Status   Specimen Description   Final    IN/OUT CATH URINE Performed at [place] [address], [place], [place] [address]    Special Requests   Final    NONE Performed at [facility], [place] [address], [place], [place] [address]    Culture 20,000 COLONIES/mL STAPHYLOCOCCUS EPIDERMIDIS (A)  Final   Report Status 08/10/2022 FINAL  Final   Organism ID, Bacteria STAPHYLOCOCCUS EPIDERMIDIS (A)  Final      Susceptibility   Staphylococcus epidermidis - MIC*    CIPROFLOXACIN <=0.5 SENSITIVE Sensitive     GENTAMICIN  <=0.5 SENSITIVE Sensitive     NITROFURANTOIN <=16 SENSITIVE Sensitive     OXACILLIN >=4 RESISTANT Resistant     TETRACYCLINE >=16 RESISTANT Resistant     VANCOMYCIN 1 SENSITIVE Sensitive     TRIMETH/SULFA >=320 RESISTANT Resistant     CLINDAMYCIN <=0.25 SENSITIVE Sensitive     RIFAMPIN <=0.5 SENSITIVE Sensitive     Inducible Clindamycin NEGATIVE Sensitive     * 20,000 COLONIES/mL STAPHYLOCOCCUS EPIDERMIDIS  Blood Culture (routine x 2)     Status: None   Collection Time:  08/08/22  2:13 PM   Specimen: BLOOD  Result Value Ref Range Status   Specimen Description   Final    BLOOD BLOOD RIGHT HAND Performed at [place] [address] [place], [place] [address]    Special Requests   Final    BOTTLES DRAWN AEROBIC AND ANAEROBIC Blood Culture results may not be optimal due to an inadequate volume of blood received in culture bottles Performed at [place] [address], [place], [place] [address]    Culture   Final    NO GROWTH 5 DAYS Performed at [facility], [place] [address] [place], [place] [address]    Report Status 08/13/2022 FINAL  Final  Blood Culture (routine x 2)     Status: None   Collection Time: 08/08/22  2:19 PM   Specimen: BLOOD  Result Value Ref Range Status   Specimen Description   Final    BLOOD BLOOD LEFT HAND Performed at [place] [address], [place], [place] [address]    Special Requests   Final    Blood Culture results may not be optimal due to an inadequate volume of blood received in culture bottles BOTTLES DRAWN AEROBIC ONLY   Culture   Final    NO GROWTH 5 DAYS Performed at [facility], [place] [address], [place], [place] [address]    Report Status 08/13/2022 FINAL  Final    Labs: CBC: Recent Labs  Lab 08/11/22 0713 08/14/22 0728 08/15/22 0829  WBC 6.4 6.5 8.6  HGB 10.4* 10.2* 10.7*  HCT 33.3* 31.7* 33.8*  MCV 101.5* 98.1 99.7  PLT 140* 208 932   Basic  Metabolic Panel: Recent Labs  Lab 08/11/22 0713 08/14/22 0728 08/15/22 0829  NA 140 140 137  K 3.9 4.1 4.3  CL 109 107 104  CO2 '25 27 25  '$ GLUCOSE 97 107* 109*  BUN 26* 24* 29*  CREATININE 0.88 0.84 1.00  CALCIUM 7.9* 8.2* 8.2*   Liver Function Tests: Recent Labs  Lab 08/11/22 0713 08/14/22 0728 08/15/22 0829  AST '22 30 28  '$ ALT '17 30 31  '$ ALKPHOS 48 59 61  BILITOT 0.6 0.6 0.7  PROT 6.4* 6.4* 6.7  ALBUMIN 3.3* 3.1* 3.3*   CBG: Recent Labs  Lab 08/09/22 2025 08/10/22 0022 08/10/22 0416  GLUCAP 86 88 90    Discharge time spent: less than 30 minutes.  Signed: Marylu Lund, MD [facility] 08/16/2022

## 2022-08-16 NOTE — Progress Notes (Signed)
Physical Therapy Treatment Patient Details Name: Donna Ortega MRN: [id number] DOB: Dec 27, 1925 Today's Date: 08/16/2022   History of Present Illness Pt is 89 yo female admitted 08/08/22 with RSV.  Pt with hx including but not limited to afib, CAD, CABG, pacemaker.    PT Comments    The patient is  less confused than  previous visit. Patient  ambulated x 100/' with Rw and min guard. SPo2  87 % on RA, 95% on 2 L. Continue PT.  Recommendations for follow up therapy are one component of a multi-disciplinary discharge planning process, led by the attending physician.  Recommendations may be updated based on patient status, additional functional criteria and insurance authorization.  Follow Up Recommendations  Home health PT (at ALF)     Assistance Recommended at Discharge Intermittent Supervision/Assistance  Patient can return home with the following A little help with walking and/or transfers;A little help with bathing/dressing/bathroom;Direct supervision/assist for medications management;Assist for transportation   Equipment Recommendations  None recommended by PT    Recommendations for Other Services       Precautions / Restrictions Precautions Precautions: Fall Precaution Comments: monitor sats Restrictions Weight Bearing Restrictions: No     Mobility  Bed Mobility               General bed mobility comments: in recliner    Transfers Overall transfer level: Needs assistance Equipment used: Rolling walker (2 wheels) Transfers: Sit to/from Stand Sit to Stand: Supervision           General transfer comment: patient stands without support at RW and without    Ambulation/Gait Ambulation/Gait assistance: Min guard Gait Distance (Feet):  30' on RA then 100 Feet on 2 LPM Assistive device: Rolling walker (2 wheels) Gait Pattern/deviations: Step-through pattern Gait velocity: decreased     General Gait Details: gait steady, turns with Rw   Stairs              Wheelchair Mobility    Modified Rankin (Stroke Patients Only)       Balance   Sitting-balance support: No upper extremity supported Sitting balance-Leahy Scale: Good     Standing balance support: No upper extremity supported, During functional activity Standing balance-Leahy Scale: Fair                              Cognition Arousal/Alertness: Awake/alert Behavior During Therapy: WFL for tasks assessed/performed                                   General Comments: less confused and  able to converse and participate        Exercises      General Comments        Pertinent Vitals/Pain Pain Assessment Pain Assessment: No/denies pain    Home Living                          Prior Function            PT Goals (current goals can now be found in the care plan section) Progress towards PT goals: Progressing toward goals    Frequency    Min 3X/week      PT Plan Current plan remains appropriate    Co-evaluation              AM-PAC PT "6 Clicks"  Mobility   Outcome Measure  Help needed turning from your back to your side while in a flat bed without using bedrails?: A Little Help needed moving from lying on your back to sitting on the side of a flat bed without using bedrails?: A Little Help needed moving to and from a bed to a chair (including a wheelchair)?: A Little Help needed standing up from a chair using your arms (e.g., wheelchair or bedside chair)?: A Little Help needed to walk in hospital room?: A Little Help needed climbing 3-5 steps with a railing? : A Lot 6 Click Score: 17    End of Session Equipment Utilized During Treatment: Gait belt Activity Tolerance: Patient tolerated treatment well Patient left: in chair;with call bell/phone within reach;with chair alarm set Nurse Communication: Mobility status PT Visit Diagnosis: Muscle weakness (generalized) (M62.81);Other abnormalities of gait and  mobility (R26.89);Difficulty in walking, not elsewhere classified (R26.2)     Time: 9373-4287 PT Time Calculation (min) (ACUTE ONLY): 25 min  Charges:  $Gait Training: 23-37 mins                     [place] Office [phone] Weekend [phone]    Claretha Cooper 08/16/2022, 12:11 PM

## 2022-08-16 NOTE — Progress Notes (Signed)
Discharge summary gone over with pt and her son. All questions answered. Pt had home oxygen and left via wheelchair with NT to exit with her son.

## 2022-08-24 DIAGNOSIS — R410 Disorientation, unspecified: Secondary | ICD-10-CM | POA: Diagnosis not present

## 2022-08-24 DIAGNOSIS — I10 Essential (primary) hypertension: Secondary | ICD-10-CM | POA: Diagnosis not present

## 2022-08-24 DIAGNOSIS — E222 Syndrome of inappropriate secretion of antidiuretic hormone: Secondary | ICD-10-CM | POA: Diagnosis not present

## 2022-08-24 DIAGNOSIS — E871 Hypo-osmolality and hyponatremia: Secondary | ICD-10-CM | POA: Diagnosis not present

## 2022-09-02 DIAGNOSIS — M62511 Muscle wasting and atrophy, not elsewhere classified, right shoulder: Secondary | ICD-10-CM | POA: Diagnosis not present

## 2022-09-02 DIAGNOSIS — R2681 Unsteadiness on feet: Secondary | ICD-10-CM | POA: Diagnosis not present

## 2022-09-02 DIAGNOSIS — M62552 Muscle wasting and atrophy, not elsewhere classified, left thigh: Secondary | ICD-10-CM | POA: Diagnosis not present

## 2022-09-02 DIAGNOSIS — M62551 Muscle wasting and atrophy, not elsewhere classified, right thigh: Secondary | ICD-10-CM | POA: Diagnosis not present

## 2022-09-02 DIAGNOSIS — M62512 Muscle wasting and atrophy, not elsewhere classified, left shoulder: Secondary | ICD-10-CM | POA: Diagnosis not present

## 2022-09-02 DIAGNOSIS — M62561 Muscle wasting and atrophy, not elsewhere classified, right lower leg: Secondary | ICD-10-CM | POA: Diagnosis not present

## 2022-09-02 DIAGNOSIS — R41841 Cognitive communication deficit: Secondary | ICD-10-CM | POA: Diagnosis not present

## 2022-09-02 DIAGNOSIS — J189 Pneumonia, unspecified organism: Secondary | ICD-10-CM | POA: Diagnosis not present

## 2022-09-02 DIAGNOSIS — M62562 Muscle wasting and atrophy, not elsewhere classified, left lower leg: Secondary | ICD-10-CM | POA: Diagnosis not present

## 2022-09-05 DIAGNOSIS — M62551 Muscle wasting and atrophy, not elsewhere classified, right thigh: Secondary | ICD-10-CM | POA: Diagnosis not present

## 2022-09-05 DIAGNOSIS — M62562 Muscle wasting and atrophy, not elsewhere classified, left lower leg: Secondary | ICD-10-CM | POA: Diagnosis not present

## 2022-09-05 DIAGNOSIS — M62512 Muscle wasting and atrophy, not elsewhere classified, left shoulder: Secondary | ICD-10-CM | POA: Diagnosis not present

## 2022-09-05 DIAGNOSIS — R2681 Unsteadiness on feet: Secondary | ICD-10-CM | POA: Diagnosis not present

## 2022-09-05 DIAGNOSIS — R41841 Cognitive communication deficit: Secondary | ICD-10-CM | POA: Diagnosis not present

## 2022-09-05 DIAGNOSIS — M62511 Muscle wasting and atrophy, not elsewhere classified, right shoulder: Secondary | ICD-10-CM | POA: Diagnosis not present

## 2022-09-05 DIAGNOSIS — M62561 Muscle wasting and atrophy, not elsewhere classified, right lower leg: Secondary | ICD-10-CM | POA: Diagnosis not present

## 2022-09-05 DIAGNOSIS — M62552 Muscle wasting and atrophy, not elsewhere classified, left thigh: Secondary | ICD-10-CM | POA: Diagnosis not present

## 2022-09-05 DIAGNOSIS — J189 Pneumonia, unspecified organism: Secondary | ICD-10-CM | POA: Diagnosis not present

## 2022-09-06 DIAGNOSIS — R2681 Unsteadiness on feet: Secondary | ICD-10-CM | POA: Diagnosis not present

## 2022-09-06 DIAGNOSIS — M62561 Muscle wasting and atrophy, not elsewhere classified, right lower leg: Secondary | ICD-10-CM | POA: Diagnosis not present

## 2022-09-06 DIAGNOSIS — R41841 Cognitive communication deficit: Secondary | ICD-10-CM | POA: Diagnosis not present

## 2022-09-06 DIAGNOSIS — J189 Pneumonia, unspecified organism: Secondary | ICD-10-CM | POA: Diagnosis not present

## 2022-09-06 DIAGNOSIS — M62562 Muscle wasting and atrophy, not elsewhere classified, left lower leg: Secondary | ICD-10-CM | POA: Diagnosis not present

## 2022-09-06 DIAGNOSIS — M62552 Muscle wasting and atrophy, not elsewhere classified, left thigh: Secondary | ICD-10-CM | POA: Diagnosis not present

## 2022-09-06 DIAGNOSIS — M62512 Muscle wasting and atrophy, not elsewhere classified, left shoulder: Secondary | ICD-10-CM | POA: Diagnosis not present

## 2022-09-06 DIAGNOSIS — M62551 Muscle wasting and atrophy, not elsewhere classified, right thigh: Secondary | ICD-10-CM | POA: Diagnosis not present

## 2022-09-06 DIAGNOSIS — M62511 Muscle wasting and atrophy, not elsewhere classified, right shoulder: Secondary | ICD-10-CM | POA: Diagnosis not present

## 2022-09-07 DIAGNOSIS — R2681 Unsteadiness on feet: Secondary | ICD-10-CM | POA: Diagnosis not present

## 2022-09-07 DIAGNOSIS — R41841 Cognitive communication deficit: Secondary | ICD-10-CM | POA: Diagnosis not present

## 2022-09-07 DIAGNOSIS — J189 Pneumonia, unspecified organism: Secondary | ICD-10-CM | POA: Diagnosis not present

## 2022-09-07 DIAGNOSIS — M62561 Muscle wasting and atrophy, not elsewhere classified, right lower leg: Secondary | ICD-10-CM | POA: Diagnosis not present

## 2022-09-07 DIAGNOSIS — M62512 Muscle wasting and atrophy, not elsewhere classified, left shoulder: Secondary | ICD-10-CM | POA: Diagnosis not present

## 2022-09-07 DIAGNOSIS — M62511 Muscle wasting and atrophy, not elsewhere classified, right shoulder: Secondary | ICD-10-CM | POA: Diagnosis not present

## 2022-09-07 DIAGNOSIS — M62562 Muscle wasting and atrophy, not elsewhere classified, left lower leg: Secondary | ICD-10-CM | POA: Diagnosis not present

## 2022-09-07 DIAGNOSIS — M62551 Muscle wasting and atrophy, not elsewhere classified, right thigh: Secondary | ICD-10-CM | POA: Diagnosis not present

## 2022-09-07 DIAGNOSIS — M62552 Muscle wasting and atrophy, not elsewhere classified, left thigh: Secondary | ICD-10-CM | POA: Diagnosis not present

## 2022-09-08 DIAGNOSIS — M62561 Muscle wasting and atrophy, not elsewhere classified, right lower leg: Secondary | ICD-10-CM | POA: Diagnosis not present

## 2022-09-08 DIAGNOSIS — M62552 Muscle wasting and atrophy, not elsewhere classified, left thigh: Secondary | ICD-10-CM | POA: Diagnosis not present

## 2022-09-08 DIAGNOSIS — R41841 Cognitive communication deficit: Secondary | ICD-10-CM | POA: Diagnosis not present

## 2022-09-08 DIAGNOSIS — R2681 Unsteadiness on feet: Secondary | ICD-10-CM | POA: Diagnosis not present

## 2022-09-08 DIAGNOSIS — M62511 Muscle wasting and atrophy, not elsewhere classified, right shoulder: Secondary | ICD-10-CM | POA: Diagnosis not present

## 2022-09-08 DIAGNOSIS — M62551 Muscle wasting and atrophy, not elsewhere classified, right thigh: Secondary | ICD-10-CM | POA: Diagnosis not present

## 2022-09-08 DIAGNOSIS — J189 Pneumonia, unspecified organism: Secondary | ICD-10-CM | POA: Diagnosis not present

## 2022-09-08 DIAGNOSIS — M62562 Muscle wasting and atrophy, not elsewhere classified, left lower leg: Secondary | ICD-10-CM | POA: Diagnosis not present

## 2022-09-08 DIAGNOSIS — M62512 Muscle wasting and atrophy, not elsewhere classified, left shoulder: Secondary | ICD-10-CM | POA: Diagnosis not present

## 2022-09-13 DIAGNOSIS — R2681 Unsteadiness on feet: Secondary | ICD-10-CM | POA: Diagnosis not present

## 2022-09-13 DIAGNOSIS — M62551 Muscle wasting and atrophy, not elsewhere classified, right thigh: Secondary | ICD-10-CM | POA: Diagnosis not present

## 2022-09-13 DIAGNOSIS — M62512 Muscle wasting and atrophy, not elsewhere classified, left shoulder: Secondary | ICD-10-CM | POA: Diagnosis not present

## 2022-09-13 DIAGNOSIS — M62552 Muscle wasting and atrophy, not elsewhere classified, left thigh: Secondary | ICD-10-CM | POA: Diagnosis not present

## 2022-09-13 DIAGNOSIS — J189 Pneumonia, unspecified organism: Secondary | ICD-10-CM | POA: Diagnosis not present

## 2022-09-13 DIAGNOSIS — M62562 Muscle wasting and atrophy, not elsewhere classified, left lower leg: Secondary | ICD-10-CM | POA: Diagnosis not present

## 2022-09-13 DIAGNOSIS — M62511 Muscle wasting and atrophy, not elsewhere classified, right shoulder: Secondary | ICD-10-CM | POA: Diagnosis not present

## 2022-09-13 DIAGNOSIS — M62561 Muscle wasting and atrophy, not elsewhere classified, right lower leg: Secondary | ICD-10-CM | POA: Diagnosis not present

## 2022-09-13 DIAGNOSIS — R41841 Cognitive communication deficit: Secondary | ICD-10-CM | POA: Diagnosis not present

## 2022-09-14 DIAGNOSIS — N1832 Chronic kidney disease, stage 3b: Secondary | ICD-10-CM | POA: Diagnosis not present

## 2022-09-14 DIAGNOSIS — R2681 Unsteadiness on feet: Secondary | ICD-10-CM | POA: Diagnosis not present

## 2022-09-14 DIAGNOSIS — R4189 Other symptoms and signs involving cognitive functions and awareness: Secondary | ICD-10-CM | POA: Diagnosis not present

## 2022-09-14 DIAGNOSIS — I10 Essential (primary) hypertension: Secondary | ICD-10-CM | POA: Diagnosis not present

## 2022-09-14 DIAGNOSIS — J189 Pneumonia, unspecified organism: Secondary | ICD-10-CM | POA: Diagnosis not present

## 2022-09-14 DIAGNOSIS — M62551 Muscle wasting and atrophy, not elsewhere classified, right thigh: Secondary | ICD-10-CM | POA: Diagnosis not present

## 2022-09-14 DIAGNOSIS — M62552 Muscle wasting and atrophy, not elsewhere classified, left thigh: Secondary | ICD-10-CM | POA: Diagnosis not present

## 2022-09-14 DIAGNOSIS — R600 Localized edema: Secondary | ICD-10-CM | POA: Diagnosis not present

## 2022-09-14 DIAGNOSIS — M62511 Muscle wasting and atrophy, not elsewhere classified, right shoulder: Secondary | ICD-10-CM | POA: Diagnosis not present

## 2022-09-14 DIAGNOSIS — M62562 Muscle wasting and atrophy, not elsewhere classified, left lower leg: Secondary | ICD-10-CM | POA: Diagnosis not present

## 2022-09-14 DIAGNOSIS — N3000 Acute cystitis without hematuria: Secondary | ICD-10-CM | POA: Diagnosis not present

## 2022-09-14 DIAGNOSIS — R41841 Cognitive communication deficit: Secondary | ICD-10-CM | POA: Diagnosis not present

## 2022-09-14 DIAGNOSIS — M62561 Muscle wasting and atrophy, not elsewhere classified, right lower leg: Secondary | ICD-10-CM | POA: Diagnosis not present

## 2022-09-14 DIAGNOSIS — M62512 Muscle wasting and atrophy, not elsewhere classified, left shoulder: Secondary | ICD-10-CM | POA: Diagnosis not present

## 2022-09-15 DIAGNOSIS — J189 Pneumonia, unspecified organism: Secondary | ICD-10-CM | POA: Diagnosis not present

## 2022-09-15 DIAGNOSIS — M62551 Muscle wasting and atrophy, not elsewhere classified, right thigh: Secondary | ICD-10-CM | POA: Diagnosis not present

## 2022-09-15 DIAGNOSIS — M62562 Muscle wasting and atrophy, not elsewhere classified, left lower leg: Secondary | ICD-10-CM | POA: Diagnosis not present

## 2022-09-15 DIAGNOSIS — M62511 Muscle wasting and atrophy, not elsewhere classified, right shoulder: Secondary | ICD-10-CM | POA: Diagnosis not present

## 2022-09-15 DIAGNOSIS — M62552 Muscle wasting and atrophy, not elsewhere classified, left thigh: Secondary | ICD-10-CM | POA: Diagnosis not present

## 2022-09-15 DIAGNOSIS — M62561 Muscle wasting and atrophy, not elsewhere classified, right lower leg: Secondary | ICD-10-CM | POA: Diagnosis not present

## 2022-09-15 DIAGNOSIS — M62512 Muscle wasting and atrophy, not elsewhere classified, left shoulder: Secondary | ICD-10-CM | POA: Diagnosis not present

## 2022-09-15 DIAGNOSIS — R41841 Cognitive communication deficit: Secondary | ICD-10-CM | POA: Diagnosis not present

## 2022-09-15 DIAGNOSIS — R2681 Unsteadiness on feet: Secondary | ICD-10-CM | POA: Diagnosis not present

## 2022-09-16 DIAGNOSIS — M62511 Muscle wasting and atrophy, not elsewhere classified, right shoulder: Secondary | ICD-10-CM | POA: Diagnosis not present

## 2022-09-16 DIAGNOSIS — M62561 Muscle wasting and atrophy, not elsewhere classified, right lower leg: Secondary | ICD-10-CM | POA: Diagnosis not present

## 2022-09-16 DIAGNOSIS — M62562 Muscle wasting and atrophy, not elsewhere classified, left lower leg: Secondary | ICD-10-CM | POA: Diagnosis not present

## 2022-09-16 DIAGNOSIS — J189 Pneumonia, unspecified organism: Secondary | ICD-10-CM | POA: Diagnosis not present

## 2022-09-16 DIAGNOSIS — M62512 Muscle wasting and atrophy, not elsewhere classified, left shoulder: Secondary | ICD-10-CM | POA: Diagnosis not present

## 2022-09-16 DIAGNOSIS — M62552 Muscle wasting and atrophy, not elsewhere classified, left thigh: Secondary | ICD-10-CM | POA: Diagnosis not present

## 2022-09-16 DIAGNOSIS — R41841 Cognitive communication deficit: Secondary | ICD-10-CM | POA: Diagnosis not present

## 2022-09-16 DIAGNOSIS — R2681 Unsteadiness on feet: Secondary | ICD-10-CM | POA: Diagnosis not present

## 2022-09-16 DIAGNOSIS — M62551 Muscle wasting and atrophy, not elsewhere classified, right thigh: Secondary | ICD-10-CM | POA: Diagnosis not present

## 2022-09-19 ENCOUNTER — Other Ambulatory Visit: Payer: Self-pay | Admitting: Cardiovascular Disease

## 2022-09-19 DIAGNOSIS — M62562 Muscle wasting and atrophy, not elsewhere classified, left lower leg: Secondary | ICD-10-CM | POA: Diagnosis not present

## 2022-09-19 DIAGNOSIS — R41841 Cognitive communication deficit: Secondary | ICD-10-CM | POA: Diagnosis not present

## 2022-09-19 DIAGNOSIS — M62561 Muscle wasting and atrophy, not elsewhere classified, right lower leg: Secondary | ICD-10-CM | POA: Diagnosis not present

## 2022-09-19 DIAGNOSIS — M62512 Muscle wasting and atrophy, not elsewhere classified, left shoulder: Secondary | ICD-10-CM | POA: Diagnosis not present

## 2022-09-19 DIAGNOSIS — R2681 Unsteadiness on feet: Secondary | ICD-10-CM | POA: Diagnosis not present

## 2022-09-19 DIAGNOSIS — M62552 Muscle wasting and atrophy, not elsewhere classified, left thigh: Secondary | ICD-10-CM | POA: Diagnosis not present

## 2022-09-19 DIAGNOSIS — I480 Paroxysmal atrial fibrillation: Secondary | ICD-10-CM

## 2022-09-19 DIAGNOSIS — M62551 Muscle wasting and atrophy, not elsewhere classified, right thigh: Secondary | ICD-10-CM | POA: Diagnosis not present

## 2022-09-19 DIAGNOSIS — M62511 Muscle wasting and atrophy, not elsewhere classified, right shoulder: Secondary | ICD-10-CM | POA: Diagnosis not present

## 2022-09-19 DIAGNOSIS — J189 Pneumonia, unspecified organism: Secondary | ICD-10-CM | POA: Diagnosis not present

## 2022-09-19 NOTE — Telephone Encounter (Signed)
Prescription refill request for Eliquis received. Indication:afib Last office visit:7/23 Scr:1.0 1/24 Age: 89 Weight:60.8  kg  Prescription refilled

## 2022-09-20 DIAGNOSIS — M62552 Muscle wasting and atrophy, not elsewhere classified, left thigh: Secondary | ICD-10-CM | POA: Diagnosis not present

## 2022-09-20 DIAGNOSIS — R41841 Cognitive communication deficit: Secondary | ICD-10-CM | POA: Diagnosis not present

## 2022-09-20 DIAGNOSIS — M62551 Muscle wasting and atrophy, not elsewhere classified, right thigh: Secondary | ICD-10-CM | POA: Diagnosis not present

## 2022-09-20 DIAGNOSIS — M62561 Muscle wasting and atrophy, not elsewhere classified, right lower leg: Secondary | ICD-10-CM | POA: Diagnosis not present

## 2022-09-20 DIAGNOSIS — M62511 Muscle wasting and atrophy, not elsewhere classified, right shoulder: Secondary | ICD-10-CM | POA: Diagnosis not present

## 2022-09-20 DIAGNOSIS — M62562 Muscle wasting and atrophy, not elsewhere classified, left lower leg: Secondary | ICD-10-CM | POA: Diagnosis not present

## 2022-09-20 DIAGNOSIS — J189 Pneumonia, unspecified organism: Secondary | ICD-10-CM | POA: Diagnosis not present

## 2022-09-20 DIAGNOSIS — R2681 Unsteadiness on feet: Secondary | ICD-10-CM | POA: Diagnosis not present

## 2022-09-20 DIAGNOSIS — M62512 Muscle wasting and atrophy, not elsewhere classified, left shoulder: Secondary | ICD-10-CM | POA: Diagnosis not present

## 2022-09-21 DIAGNOSIS — M62551 Muscle wasting and atrophy, not elsewhere classified, right thigh: Secondary | ICD-10-CM | POA: Diagnosis not present

## 2022-09-21 DIAGNOSIS — M62562 Muscle wasting and atrophy, not elsewhere classified, left lower leg: Secondary | ICD-10-CM | POA: Diagnosis not present

## 2022-09-21 DIAGNOSIS — J189 Pneumonia, unspecified organism: Secondary | ICD-10-CM | POA: Diagnosis not present

## 2022-09-21 DIAGNOSIS — R2681 Unsteadiness on feet: Secondary | ICD-10-CM | POA: Diagnosis not present

## 2022-09-21 DIAGNOSIS — R41841 Cognitive communication deficit: Secondary | ICD-10-CM | POA: Diagnosis not present

## 2022-09-21 DIAGNOSIS — M62512 Muscle wasting and atrophy, not elsewhere classified, left shoulder: Secondary | ICD-10-CM | POA: Diagnosis not present

## 2022-09-21 DIAGNOSIS — M62561 Muscle wasting and atrophy, not elsewhere classified, right lower leg: Secondary | ICD-10-CM | POA: Diagnosis not present

## 2022-09-21 DIAGNOSIS — M62511 Muscle wasting and atrophy, not elsewhere classified, right shoulder: Secondary | ICD-10-CM | POA: Diagnosis not present

## 2022-09-21 DIAGNOSIS — M62552 Muscle wasting and atrophy, not elsewhere classified, left thigh: Secondary | ICD-10-CM | POA: Diagnosis not present

## 2022-09-22 DIAGNOSIS — Z8249 Family history of ischemic heart disease and other diseases of the circulatory system: Secondary | ICD-10-CM | POA: Diagnosis not present

## 2022-09-22 DIAGNOSIS — M62511 Muscle wasting and atrophy, not elsewhere classified, right shoulder: Secondary | ICD-10-CM | POA: Diagnosis not present

## 2022-09-22 DIAGNOSIS — L309 Dermatitis, unspecified: Secondary | ICD-10-CM | POA: Diagnosis not present

## 2022-09-22 DIAGNOSIS — R69 Illness, unspecified: Secondary | ICD-10-CM | POA: Diagnosis not present

## 2022-09-22 DIAGNOSIS — D6869 Other thrombophilia: Secondary | ICD-10-CM | POA: Diagnosis not present

## 2022-09-22 DIAGNOSIS — M1990 Unspecified osteoarthritis, unspecified site: Secondary | ICD-10-CM | POA: Diagnosis not present

## 2022-09-22 DIAGNOSIS — R269 Unspecified abnormalities of gait and mobility: Secondary | ICD-10-CM | POA: Diagnosis not present

## 2022-09-22 DIAGNOSIS — I130 Hypertensive heart and chronic kidney disease with heart failure and stage 1 through stage 4 chronic kidney disease, or unspecified chronic kidney disease: Secondary | ICD-10-CM | POA: Diagnosis not present

## 2022-09-22 DIAGNOSIS — J189 Pneumonia, unspecified organism: Secondary | ICD-10-CM | POA: Diagnosis not present

## 2022-09-22 DIAGNOSIS — M62562 Muscle wasting and atrophy, not elsewhere classified, left lower leg: Secondary | ICD-10-CM | POA: Diagnosis not present

## 2022-09-22 DIAGNOSIS — I509 Heart failure, unspecified: Secondary | ICD-10-CM | POA: Diagnosis not present

## 2022-09-22 DIAGNOSIS — R2681 Unsteadiness on feet: Secondary | ICD-10-CM | POA: Diagnosis not present

## 2022-09-22 DIAGNOSIS — E876 Hypokalemia: Secondary | ICD-10-CM | POA: Diagnosis not present

## 2022-09-22 DIAGNOSIS — M62512 Muscle wasting and atrophy, not elsewhere classified, left shoulder: Secondary | ICD-10-CM | POA: Diagnosis not present

## 2022-09-22 DIAGNOSIS — R41841 Cognitive communication deficit: Secondary | ICD-10-CM | POA: Diagnosis not present

## 2022-09-22 DIAGNOSIS — N189 Chronic kidney disease, unspecified: Secondary | ICD-10-CM | POA: Diagnosis not present

## 2022-09-22 DIAGNOSIS — M62551 Muscle wasting and atrophy, not elsewhere classified, right thigh: Secondary | ICD-10-CM | POA: Diagnosis not present

## 2022-09-22 DIAGNOSIS — E785 Hyperlipidemia, unspecified: Secondary | ICD-10-CM | POA: Diagnosis not present

## 2022-09-22 DIAGNOSIS — F0390 Unspecified dementia without behavioral disturbance: Secondary | ICD-10-CM | POA: Diagnosis not present

## 2022-09-22 DIAGNOSIS — M62552 Muscle wasting and atrophy, not elsewhere classified, left thigh: Secondary | ICD-10-CM | POA: Diagnosis not present

## 2022-09-22 DIAGNOSIS — I2510 Atherosclerotic heart disease of native coronary artery without angina pectoris: Secondary | ICD-10-CM | POA: Diagnosis not present

## 2022-09-22 DIAGNOSIS — M62561 Muscle wasting and atrophy, not elsewhere classified, right lower leg: Secondary | ICD-10-CM | POA: Diagnosis not present

## 2022-09-26 DIAGNOSIS — M62552 Muscle wasting and atrophy, not elsewhere classified, left thigh: Secondary | ICD-10-CM | POA: Diagnosis not present

## 2022-09-26 DIAGNOSIS — M62511 Muscle wasting and atrophy, not elsewhere classified, right shoulder: Secondary | ICD-10-CM | POA: Diagnosis not present

## 2022-09-26 DIAGNOSIS — M62561 Muscle wasting and atrophy, not elsewhere classified, right lower leg: Secondary | ICD-10-CM | POA: Diagnosis not present

## 2022-09-26 DIAGNOSIS — J189 Pneumonia, unspecified organism: Secondary | ICD-10-CM | POA: Diagnosis not present

## 2022-09-26 DIAGNOSIS — M62551 Muscle wasting and atrophy, not elsewhere classified, right thigh: Secondary | ICD-10-CM | POA: Diagnosis not present

## 2022-09-26 DIAGNOSIS — R2681 Unsteadiness on feet: Secondary | ICD-10-CM | POA: Diagnosis not present

## 2022-09-26 DIAGNOSIS — M62562 Muscle wasting and atrophy, not elsewhere classified, left lower leg: Secondary | ICD-10-CM | POA: Diagnosis not present

## 2022-09-26 DIAGNOSIS — R41841 Cognitive communication deficit: Secondary | ICD-10-CM | POA: Diagnosis not present

## 2022-09-26 DIAGNOSIS — M62512 Muscle wasting and atrophy, not elsewhere classified, left shoulder: Secondary | ICD-10-CM | POA: Diagnosis not present

## 2022-09-28 DIAGNOSIS — M62561 Muscle wasting and atrophy, not elsewhere classified, right lower leg: Secondary | ICD-10-CM | POA: Diagnosis not present

## 2022-09-28 DIAGNOSIS — M62552 Muscle wasting and atrophy, not elsewhere classified, left thigh: Secondary | ICD-10-CM | POA: Diagnosis not present

## 2022-09-28 DIAGNOSIS — M62512 Muscle wasting and atrophy, not elsewhere classified, left shoulder: Secondary | ICD-10-CM | POA: Diagnosis not present

## 2022-09-28 DIAGNOSIS — M62511 Muscle wasting and atrophy, not elsewhere classified, right shoulder: Secondary | ICD-10-CM | POA: Diagnosis not present

## 2022-09-28 DIAGNOSIS — M62551 Muscle wasting and atrophy, not elsewhere classified, right thigh: Secondary | ICD-10-CM | POA: Diagnosis not present

## 2022-09-28 DIAGNOSIS — R2681 Unsteadiness on feet: Secondary | ICD-10-CM | POA: Diagnosis not present

## 2022-09-28 DIAGNOSIS — M62562 Muscle wasting and atrophy, not elsewhere classified, left lower leg: Secondary | ICD-10-CM | POA: Diagnosis not present

## 2022-09-28 DIAGNOSIS — R41841 Cognitive communication deficit: Secondary | ICD-10-CM | POA: Diagnosis not present

## 2022-09-28 DIAGNOSIS — J189 Pneumonia, unspecified organism: Secondary | ICD-10-CM | POA: Diagnosis not present

## 2022-09-29 DIAGNOSIS — J189 Pneumonia, unspecified organism: Secondary | ICD-10-CM | POA: Diagnosis not present

## 2022-09-29 DIAGNOSIS — M62552 Muscle wasting and atrophy, not elsewhere classified, left thigh: Secondary | ICD-10-CM | POA: Diagnosis not present

## 2022-09-29 DIAGNOSIS — M62562 Muscle wasting and atrophy, not elsewhere classified, left lower leg: Secondary | ICD-10-CM | POA: Diagnosis not present

## 2022-09-29 DIAGNOSIS — M62551 Muscle wasting and atrophy, not elsewhere classified, right thigh: Secondary | ICD-10-CM | POA: Diagnosis not present

## 2022-09-29 DIAGNOSIS — M62561 Muscle wasting and atrophy, not elsewhere classified, right lower leg: Secondary | ICD-10-CM | POA: Diagnosis not present

## 2022-09-29 DIAGNOSIS — R2681 Unsteadiness on feet: Secondary | ICD-10-CM | POA: Diagnosis not present

## 2022-09-29 DIAGNOSIS — R41841 Cognitive communication deficit: Secondary | ICD-10-CM | POA: Diagnosis not present

## 2022-09-29 DIAGNOSIS — M62512 Muscle wasting and atrophy, not elsewhere classified, left shoulder: Secondary | ICD-10-CM | POA: Diagnosis not present

## 2022-09-29 DIAGNOSIS — M62511 Muscle wasting and atrophy, not elsewhere classified, right shoulder: Secondary | ICD-10-CM | POA: Diagnosis not present

## 2022-09-30 DIAGNOSIS — M62562 Muscle wasting and atrophy, not elsewhere classified, left lower leg: Secondary | ICD-10-CM | POA: Diagnosis not present

## 2022-09-30 DIAGNOSIS — M62551 Muscle wasting and atrophy, not elsewhere classified, right thigh: Secondary | ICD-10-CM | POA: Diagnosis not present

## 2022-09-30 DIAGNOSIS — M62561 Muscle wasting and atrophy, not elsewhere classified, right lower leg: Secondary | ICD-10-CM | POA: Diagnosis not present

## 2022-09-30 DIAGNOSIS — R41841 Cognitive communication deficit: Secondary | ICD-10-CM | POA: Diagnosis not present

## 2022-09-30 DIAGNOSIS — J189 Pneumonia, unspecified organism: Secondary | ICD-10-CM | POA: Diagnosis not present

## 2022-09-30 DIAGNOSIS — M62511 Muscle wasting and atrophy, not elsewhere classified, right shoulder: Secondary | ICD-10-CM | POA: Diagnosis not present

## 2022-09-30 DIAGNOSIS — M62552 Muscle wasting and atrophy, not elsewhere classified, left thigh: Secondary | ICD-10-CM | POA: Diagnosis not present

## 2022-09-30 DIAGNOSIS — R2681 Unsteadiness on feet: Secondary | ICD-10-CM | POA: Diagnosis not present

## 2022-09-30 DIAGNOSIS — M62512 Muscle wasting and atrophy, not elsewhere classified, left shoulder: Secondary | ICD-10-CM | POA: Diagnosis not present

## 2022-10-04 DIAGNOSIS — R2681 Unsteadiness on feet: Secondary | ICD-10-CM | POA: Diagnosis not present

## 2022-10-04 DIAGNOSIS — M62562 Muscle wasting and atrophy, not elsewhere classified, left lower leg: Secondary | ICD-10-CM | POA: Diagnosis not present

## 2022-10-04 DIAGNOSIS — R41841 Cognitive communication deficit: Secondary | ICD-10-CM | POA: Diagnosis not present

## 2022-10-04 DIAGNOSIS — J189 Pneumonia, unspecified organism: Secondary | ICD-10-CM | POA: Diagnosis not present

## 2022-10-04 DIAGNOSIS — M62511 Muscle wasting and atrophy, not elsewhere classified, right shoulder: Secondary | ICD-10-CM | POA: Diagnosis not present

## 2022-10-04 DIAGNOSIS — M62512 Muscle wasting and atrophy, not elsewhere classified, left shoulder: Secondary | ICD-10-CM | POA: Diagnosis not present

## 2022-10-04 DIAGNOSIS — M62551 Muscle wasting and atrophy, not elsewhere classified, right thigh: Secondary | ICD-10-CM | POA: Diagnosis not present

## 2022-10-04 DIAGNOSIS — M62552 Muscle wasting and atrophy, not elsewhere classified, left thigh: Secondary | ICD-10-CM | POA: Diagnosis not present

## 2022-10-04 DIAGNOSIS — M62561 Muscle wasting and atrophy, not elsewhere classified, right lower leg: Secondary | ICD-10-CM | POA: Diagnosis not present

## 2022-10-06 DIAGNOSIS — R41841 Cognitive communication deficit: Secondary | ICD-10-CM | POA: Diagnosis not present

## 2022-10-06 DIAGNOSIS — M62511 Muscle wasting and atrophy, not elsewhere classified, right shoulder: Secondary | ICD-10-CM | POA: Diagnosis not present

## 2022-10-06 DIAGNOSIS — M62561 Muscle wasting and atrophy, not elsewhere classified, right lower leg: Secondary | ICD-10-CM | POA: Diagnosis not present

## 2022-10-06 DIAGNOSIS — J189 Pneumonia, unspecified organism: Secondary | ICD-10-CM | POA: Diagnosis not present

## 2022-10-06 DIAGNOSIS — M62512 Muscle wasting and atrophy, not elsewhere classified, left shoulder: Secondary | ICD-10-CM | POA: Diagnosis not present

## 2022-10-06 DIAGNOSIS — M62552 Muscle wasting and atrophy, not elsewhere classified, left thigh: Secondary | ICD-10-CM | POA: Diagnosis not present

## 2022-10-06 DIAGNOSIS — R2681 Unsteadiness on feet: Secondary | ICD-10-CM | POA: Diagnosis not present

## 2022-10-06 DIAGNOSIS — M62551 Muscle wasting and atrophy, not elsewhere classified, right thigh: Secondary | ICD-10-CM | POA: Diagnosis not present

## 2022-10-06 DIAGNOSIS — M62562 Muscle wasting and atrophy, not elsewhere classified, left lower leg: Secondary | ICD-10-CM | POA: Diagnosis not present

## 2022-10-10 DIAGNOSIS — M62561 Muscle wasting and atrophy, not elsewhere classified, right lower leg: Secondary | ICD-10-CM | POA: Diagnosis not present

## 2022-10-10 DIAGNOSIS — J189 Pneumonia, unspecified organism: Secondary | ICD-10-CM | POA: Diagnosis not present

## 2022-10-10 DIAGNOSIS — M62512 Muscle wasting and atrophy, not elsewhere classified, left shoulder: Secondary | ICD-10-CM | POA: Diagnosis not present

## 2022-10-10 DIAGNOSIS — M62551 Muscle wasting and atrophy, not elsewhere classified, right thigh: Secondary | ICD-10-CM | POA: Diagnosis not present

## 2022-10-10 DIAGNOSIS — M62562 Muscle wasting and atrophy, not elsewhere classified, left lower leg: Secondary | ICD-10-CM | POA: Diagnosis not present

## 2022-10-10 DIAGNOSIS — M62552 Muscle wasting and atrophy, not elsewhere classified, left thigh: Secondary | ICD-10-CM | POA: Diagnosis not present

## 2022-10-10 DIAGNOSIS — R41841 Cognitive communication deficit: Secondary | ICD-10-CM | POA: Diagnosis not present

## 2022-10-10 DIAGNOSIS — M62511 Muscle wasting and atrophy, not elsewhere classified, right shoulder: Secondary | ICD-10-CM | POA: Diagnosis not present

## 2022-10-10 DIAGNOSIS — R2681 Unsteadiness on feet: Secondary | ICD-10-CM | POA: Diagnosis not present

## 2022-10-12 DIAGNOSIS — M62561 Muscle wasting and atrophy, not elsewhere classified, right lower leg: Secondary | ICD-10-CM | POA: Diagnosis not present

## 2022-10-12 DIAGNOSIS — M62511 Muscle wasting and atrophy, not elsewhere classified, right shoulder: Secondary | ICD-10-CM | POA: Diagnosis not present

## 2022-10-12 DIAGNOSIS — M62512 Muscle wasting and atrophy, not elsewhere classified, left shoulder: Secondary | ICD-10-CM | POA: Diagnosis not present

## 2022-10-12 DIAGNOSIS — M62552 Muscle wasting and atrophy, not elsewhere classified, left thigh: Secondary | ICD-10-CM | POA: Diagnosis not present

## 2022-10-12 DIAGNOSIS — M62562 Muscle wasting and atrophy, not elsewhere classified, left lower leg: Secondary | ICD-10-CM | POA: Diagnosis not present

## 2022-10-12 DIAGNOSIS — J189 Pneumonia, unspecified organism: Secondary | ICD-10-CM | POA: Diagnosis not present

## 2022-10-12 DIAGNOSIS — M62551 Muscle wasting and atrophy, not elsewhere classified, right thigh: Secondary | ICD-10-CM | POA: Diagnosis not present

## 2022-10-12 DIAGNOSIS — R41841 Cognitive communication deficit: Secondary | ICD-10-CM | POA: Diagnosis not present

## 2022-10-12 DIAGNOSIS — R2681 Unsteadiness on feet: Secondary | ICD-10-CM | POA: Diagnosis not present

## 2022-10-17 DIAGNOSIS — R2681 Unsteadiness on feet: Secondary | ICD-10-CM | POA: Diagnosis not present

## 2022-10-17 DIAGNOSIS — M62512 Muscle wasting and atrophy, not elsewhere classified, left shoulder: Secondary | ICD-10-CM | POA: Diagnosis not present

## 2022-10-17 DIAGNOSIS — R41841 Cognitive communication deficit: Secondary | ICD-10-CM | POA: Diagnosis not present

## 2022-10-17 DIAGNOSIS — M62511 Muscle wasting and atrophy, not elsewhere classified, right shoulder: Secondary | ICD-10-CM | POA: Diagnosis not present

## 2022-10-19 DIAGNOSIS — R41841 Cognitive communication deficit: Secondary | ICD-10-CM | POA: Diagnosis not present

## 2022-10-19 DIAGNOSIS — R2681 Unsteadiness on feet: Secondary | ICD-10-CM | POA: Diagnosis not present

## 2022-10-19 DIAGNOSIS — M62512 Muscle wasting and atrophy, not elsewhere classified, left shoulder: Secondary | ICD-10-CM | POA: Diagnosis not present

## 2022-10-19 DIAGNOSIS — M62511 Muscle wasting and atrophy, not elsewhere classified, right shoulder: Secondary | ICD-10-CM | POA: Diagnosis not present

## 2022-10-21 DIAGNOSIS — R2681 Unsteadiness on feet: Secondary | ICD-10-CM | POA: Diagnosis not present

## 2022-10-21 DIAGNOSIS — R41841 Cognitive communication deficit: Secondary | ICD-10-CM | POA: Diagnosis not present

## 2022-10-21 DIAGNOSIS — M62512 Muscle wasting and atrophy, not elsewhere classified, left shoulder: Secondary | ICD-10-CM | POA: Diagnosis not present

## 2022-10-21 DIAGNOSIS — M62511 Muscle wasting and atrophy, not elsewhere classified, right shoulder: Secondary | ICD-10-CM | POA: Diagnosis not present

## 2022-10-26 DIAGNOSIS — R2681 Unsteadiness on feet: Secondary | ICD-10-CM | POA: Diagnosis not present

## 2022-10-26 DIAGNOSIS — R41841 Cognitive communication deficit: Secondary | ICD-10-CM | POA: Diagnosis not present

## 2022-10-26 DIAGNOSIS — M62511 Muscle wasting and atrophy, not elsewhere classified, right shoulder: Secondary | ICD-10-CM | POA: Diagnosis not present

## 2022-10-26 DIAGNOSIS — M62512 Muscle wasting and atrophy, not elsewhere classified, left shoulder: Secondary | ICD-10-CM | POA: Diagnosis not present

## 2022-10-27 DIAGNOSIS — R41841 Cognitive communication deficit: Secondary | ICD-10-CM | POA: Diagnosis not present

## 2022-10-27 DIAGNOSIS — M62512 Muscle wasting and atrophy, not elsewhere classified, left shoulder: Secondary | ICD-10-CM | POA: Diagnosis not present

## 2022-10-27 DIAGNOSIS — M62511 Muscle wasting and atrophy, not elsewhere classified, right shoulder: Secondary | ICD-10-CM | POA: Diagnosis not present

## 2022-10-27 DIAGNOSIS — R2681 Unsteadiness on feet: Secondary | ICD-10-CM | POA: Diagnosis not present

## 2022-11-02 DIAGNOSIS — M62512 Muscle wasting and atrophy, not elsewhere classified, left shoulder: Secondary | ICD-10-CM | POA: Diagnosis not present

## 2022-11-02 DIAGNOSIS — R41841 Cognitive communication deficit: Secondary | ICD-10-CM | POA: Diagnosis not present

## 2022-11-02 DIAGNOSIS — R2681 Unsteadiness on feet: Secondary | ICD-10-CM | POA: Diagnosis not present

## 2022-11-02 DIAGNOSIS — M62511 Muscle wasting and atrophy, not elsewhere classified, right shoulder: Secondary | ICD-10-CM | POA: Diagnosis not present

## 2022-11-04 DIAGNOSIS — R2681 Unsteadiness on feet: Secondary | ICD-10-CM | POA: Diagnosis not present

## 2022-11-04 DIAGNOSIS — M62512 Muscle wasting and atrophy, not elsewhere classified, left shoulder: Secondary | ICD-10-CM | POA: Diagnosis not present

## 2022-11-04 DIAGNOSIS — M62511 Muscle wasting and atrophy, not elsewhere classified, right shoulder: Secondary | ICD-10-CM | POA: Diagnosis not present

## 2022-11-04 DIAGNOSIS — R41841 Cognitive communication deficit: Secondary | ICD-10-CM | POA: Diagnosis not present

## 2022-11-18 DIAGNOSIS — R41841 Cognitive communication deficit: Secondary | ICD-10-CM | POA: Diagnosis not present

## 2022-11-18 DIAGNOSIS — M62511 Muscle wasting and atrophy, not elsewhere classified, right shoulder: Secondary | ICD-10-CM | POA: Diagnosis not present

## 2022-11-18 DIAGNOSIS — R2681 Unsteadiness on feet: Secondary | ICD-10-CM | POA: Diagnosis not present

## 2022-11-18 DIAGNOSIS — M62512 Muscle wasting and atrophy, not elsewhere classified, left shoulder: Secondary | ICD-10-CM | POA: Diagnosis not present

## 2022-12-21 DIAGNOSIS — M810 Age-related osteoporosis without current pathological fracture: Secondary | ICD-10-CM | POA: Diagnosis not present

## 2023-01-16 ENCOUNTER — Other Ambulatory Visit: Payer: Self-pay | Admitting: Cardiovascular Disease

## 2023-01-20 ENCOUNTER — Other Ambulatory Visit: Payer: Self-pay | Admitting: Cardiovascular Disease

## 2023-01-26 ENCOUNTER — Other Ambulatory Visit: Payer: Self-pay

## 2023-01-26 ENCOUNTER — Other Ambulatory Visit: Payer: Self-pay | Admitting: Cardiovascular Disease

## 2023-01-26 MED ORDER — POTASSIUM CHLORIDE CRYS ER 10 MEQ PO TBCR: EXTENDED_RELEASE_TABLET | ORAL | 1 refills | Status: DC

## 2023-01-26 NOTE — Progress Notes (Signed)
Per Dr Elease Hashimoto - Donna Ortega is still on a torsemide but her potassium was taken off the list - perhaps it fell off after a year . Can you order kdur at the dose she was taking ( or order 10 meq if you can't find it ) to take with torsemide twice a week , thanks   Order placed as requested by Dr Elease Hashimoto.

## 2023-02-03 DIAGNOSIS — S0093XA Contusion of unspecified part of head, initial encounter: Secondary | ICD-10-CM | POA: Diagnosis not present

## 2023-02-07 DIAGNOSIS — J019 Acute sinusitis, unspecified: Secondary | ICD-10-CM | POA: Diagnosis not present

## 2023-02-07 DIAGNOSIS — H109 Unspecified conjunctivitis: Secondary | ICD-10-CM | POA: Diagnosis not present

## 2023-02-11 DIAGNOSIS — B029 Zoster without complications: Secondary | ICD-10-CM | POA: Diagnosis not present

## 2023-02-11 DIAGNOSIS — L089 Local infection of the skin and subcutaneous tissue, unspecified: Secondary | ICD-10-CM | POA: Diagnosis not present

## 2023-03-10 ENCOUNTER — Other Ambulatory Visit: Payer: Self-pay

## 2023-03-10 ENCOUNTER — Other Ambulatory Visit: Payer: Self-pay | Admitting: Cardiovascular Disease

## 2023-03-10 MED ORDER — DILTIAZEM HCL ER COATED BEADS 360 MG PO CP24: ORAL_CAPSULE | ORAL | 0 refills | Status: DC

## 2023-03-24 ENCOUNTER — Other Ambulatory Visit: Payer: Self-pay | Admitting: Cardiovascular Disease

## 2023-03-24 DIAGNOSIS — I480 Paroxysmal atrial fibrillation: Secondary | ICD-10-CM

## 2023-03-24 NOTE — Telephone Encounter (Signed)
Prescription refill request for Eliquis received. Indication: Afib  Last office visit: 02/22/22 (Nahser)  Scr: 1.00 Age: 89 Weight: 60.8kg  Appropriate dose. Refill sent.

## 2023-03-27 ENCOUNTER — Encounter: Payer: Self-pay | Admitting: Cardiovascular Disease

## 2023-03-27 NOTE — Progress Notes (Unsigned)
Donna Ortega Date of Birth  21-Apr-1926 [facility]      [address]    [address]    [place], [place]  [address]      Problems:  1. CAD - s/p CABG 2007, 2. PACs 3. Drug Rash - amiodarone 4. GI Bleed 5. Anemia 6. hyponatremia  Previous notes:   Donna Ortega is an 89 year old female with a history of coronary artery disease and atrial fibrillation. She was admitted to the hospital in December with a GI bleed, anemia, and rapid atrial fibrillation. She was discharged with the additional medications of amiodarone and Cardizem CD.  She was seen in mid January and was doing fairly well.  She stopped her Amiodarone due to drug rash.  She still has generalized fatigue and generalized weakness. She wonders if it may be due to the Lasix and potassium.  April 17, 2012 She has been more fatigued recently.  She played golf this past weekend and still is not over the exertion.  Dec. 3, 2013 :  She is doing well.  Dr. Graciela Husbands stopped her Pradaxa due to GI bleed.    She is doing very well.   January 15, 2013:  Donna Ortega is doing OK.  She does not have as much energy as she would like.   She has continued to have problems with hyponatremia.  She is on maxzide which is likely exacerbating this.  No CP, no dyspnea.    Dec. 1, 2014:  Donna Ortega is doing well.  No CP or dyspnea.  Rhythm is stable.   Her Hb was a bit low when last checked by her medical doctor.  Has not started on the iron tabs.   No CP, not playing golf any more because of back pain ( s/p kyphoplasty)  .  She is still walking around the block on occasion.   She is now in her own apartment.   Sept. 9, 2015:  Donna Ortega is doing well.  She is not having any problems   March 02, 2015: No CP , no dyspnea.   Feb. 13, 2017:  Fell 3 weeks ago .  Now is using a cane for stability  She has occasional episodes of atrial fib - last 5 minutes or so.   Is on Eliquis 2.5 BID  Has had some bladder issues   Oct. 6, 2017:  Donna Ortega is seen back for  her CAD and PAF  Has some leg swelling .  Was on Maxzide - this is off her list - perhaps because of hypotension . Lisinopril was stopped at that time    November 29, 2016:  Doing well, no angina  Knows that she is starting to forget things on occasion Has been having some swelling in her feet and ankles.  BP has been a bit higher She avoids salty foods.   Nov. 21, 2018:  Doing well.  No CP or dyspnea Had kyphoplasty this past summer.  Very active.    Still driving    Jan 02, 2018:  Donna Ortega is seen today for follow-up of her coronary artery disease and hx atrial fibrillation. Still driving - not driving to [facility] anymore  No CP or dyspnea   Oct. 25, 2021 Donna Ortega is seen today after a 2 year absence.  She was seen as a virtual visit last year. Hx of CAD, atrial fib Hx of anemia  She has developed some dementia She has been having some issues with leg  edema  intermittant shortness of breath   October 30, 2020: Donna Ortega is seen for follow up .   Seen with her son  Donna Ortega) today  Hx of CAD, atrial fib, anemia,  Moderate dementia  Leg edema Is walking regularly  Lives at [facility].   Dec. 5, 2022: Donna Ortega is seen for follow up of her CAD , atrial fib, anemia  Moderate dementia  Feels well.  Lives at [facility] with others  Is in the walking club.   Stays active    February 22, 2022 Donna Ortega is seen for follow up of her CAD, atrial fib, anemia Has progressive dementia  No cp or dyspnea Gets fatigued easier . Still does the exercise classes Minimal swelling    Aug. 13,  2024  Donna Ortega is seen for follow up of her CAD, Atrial fib, anemia Has progrossive dementia  Seen with daughter in [place], [name]  Amy reduced her dose of metoprolol to 12.5 BID  Wt is 125 lbs   We will reduce the dose of her Eliquis based on her weight and Lives are [facility] Eats 2 meals a day   She is having generalized weakness.  She has already reduced her metoprolol to 12.5 mg twice a day.   Will also reduce the Cardizem to 240 mg a day.       Current Outpatient Medications on File Prior to Visit  Medication Sig Dispense Refill   albuterol (VENTOLIN HFA) 108 (90 Base) MCG/ACT inhaler Inhale 2 puffs into the lungs every 6 (six) hours as needed for wheezing or shortness of breath. 8 g 0   diltiazem (CARDIZEM CD) 360 MG 24 hr capsule TAKE ONE CAPSULE BY MOUTH DAILY. Please keep scheduled appointment for future refills. Thank you. 30 capsule 0   diphenhydramine-acetaminophen (TYLENOL PM) 25-500 MG TABS tablet Take 2 tablets by mouth at bedtime.     metoprolol tartrate (LOPRESSOR) 25 MG tablet Take 1 tablet (25 mg total) by mouth 2 (two) times daily. (Patient taking differently: Take 25 mg by mouth 2 (two) times daily. 1/2 tablet in the morning, 1/2 tablet in the evening) 60 tablet 0   potassium chloride (KLOR-CON M) 10 MEQ tablet Take 1 tablet with Torsemide twice weekly (Patient taking differently: Take 10 mEq by mouth once. Take 1 tablet with Torsemide twice weekly) 30 tablet 1   PROLIA 60 MG/ML SOSY injection Inject 60 mg into the skin every 6 (six) months.     rosuvastatin (CRESTOR) 10 MG tablet TAKE ONE TABLET BY MOUTH DAILY (Patient taking differently: Take 10 mg by mouth daily.) 90 tablet 3   torsemide (DEMADEX) 20 MG tablet TAKE ONE TABLET BY MOUTH DAILY (Patient taking differently: Take 20 mg by mouth See admin instructions. Take one tablet by mouth every Monday & Wednesday) 90 tablet 3   Ascorbic Acid (VITAMIN C) 500 MG CAPS Take 1 tablet by mouth daily. (Patient not taking: Reported on 03/28/2023)     dextromethorphan-guaiFENesin (MUCINEX DM) 30-600 MG 12hr tablet Take 1 tablet by mouth 2 (two) times daily. (Patient not taking: Reported on 03/28/2023) 20 tablet 0   No current facility-administered medications on file prior to visit.    Allergies  Allergen Reactions   Amiodarone Hives and Other (See Comments)    ALL OVER BODY HIVES/ ITCH  other   Aspirin Other (See  Comments)    Gi bleed  other   Atorvastatin Other (See Comments)    MUSCLE ACHES other   Lipitor [employer]  Calcium] Other (See Comments)    MUSCLE ACHES   Nsaids Other (See Comments)    GI bleed   Tolmetin Other (See Comments)    GI bleed other   Naproxen Other (See Comments)   Niacin Rash and Other (See Comments)    other   Niaspan [Niacin Er] Rash    Past Medical History:  Diagnosis Date   Arthritis    osteo   in back   Coronary artery disease 2007   3 vessel CABG with LIMA-LAD, SVG to diag, and SVG - Ramus   Diverticulitis    GI bleeding 09/09/2013   Hearing loss of left ear 07/2013   History of atrial fibrillation; review of all ECG are most consistent with atrial tachycardia    had rash with amiodarone; no anticoagulation due to GI bleed in December 2012   History of GI bleed Dec 2012   History of hyperkalemia    Hyperlipidemia    Hypertension    Hyponatremia    Pacemaker-Medtronic 02/09/2012   Palpitations     Past Surgical History:  Procedure Laterality Date   ABDOMINAL HYSTERECTOMY     COLONOSCOPY N/A 09/12/2013   Procedure: COLONOSCOPY;  Surgeon: Graylin Shiver, MD;  Location[facility] ENDOSCOPY;  Service: Endoscopy;  Laterality: N/A;   CORONARY ARTERY BYPASS GRAFT  Feb 2007   Dr. Laneta Simmers;    ESOPHAGOGASTRODUODENOSCOPY  08/04/2011   Procedure: ESOPHAGOGASTRODUODENOSCOPY (EGD);  Surgeon: Florencia Reasons, MD;  Location: [facility] ENDOSCOPY;  Service: Endoscopy;  Laterality: N/A;  do at bedside   ESOPHAGOGASTRODUODENOSCOPY N/A 09/11/2013   Procedure: ESOPHAGOGASTRODUODENOSCOPY (EGD);  Surgeon: Petra Kuba, MD;  Location[facility] ENDOSCOPY;  Service: Endoscopy;  Laterality: N/A;   IR KYPHO LUMBAR INC FX REDUCE BONE BX UNI/BIL CANNULATION INC/IMAGING  07/16/2018   IR KYPHO THORACIC WITH BONE BIOPSY  04/11/2017   IR PATIENT EVAL TECH 0-60 MINS  07/13/2018   IR RADIOLOGIST EVAL & MGMT  04/04/2017   MAZE     pace maker     PARTIAL HYSTERECTOMY     PERMANENT PACEMAKER INSERTION  N/A 02/08/2012   Procedure: PERMANENT PACEMAKER INSERTION;  Surgeon: Duke Salvia, MD;  Location: [facility] CATH LAB;  Service: Cardiovascular;  Laterality: N/A;    Social History   Tobacco Use  Smoking Status Never  Smokeless Tobacco Never    Social History   Substance and Sexual Activity  Alcohol Use No    Family History  Problem Relation Age of Onset   Heart failure Father    Kidney failure Father    Bone cancer Brother    Kidney failure Sister    Colon cancer Sister    Cancer Sister     Reviw of Systems:  Reviewed in the HPI.  All other systems are negative.    Physical Exam: Blood pressure 118/70, pulse 89, height 5\' 4"  (1.626 m), weight 125 lb 3.2 oz (56.8 kg), SpO2 95%.       GEN:  Well nourished, well developed in no acute distress HEENT: Normal NECK: No JVD; No carotid bruits LYMPHATICS: No lymphadenopathy CARDIAC: RRR , no murmurs, rubs, gallops RESPIRATORY:  Clear to auscultation without rales, wheezing or rhonchi  ABDOMEN: Soft, non-tender, non-distended MUSCULOSKELETAL:  trace  edema; No deformity  SKIN: Warm and dry NEUROLOGIC:  Alert and oriented x 3   ECG:     Assessment / Plan:   1. CAD - s/p CABG 2007,   no angina   2. Atrial fib:  -  cont eliquis 2.5 bid    3.  Leg edema:   stable  Cont torsemide,  BMP today   4. GI Bleed  -      5. Anemia -     6.  Fatigue:  will reduce the dose of her metoprolol and her diltiazem   7. Hyperlipidemia -    8. HTN:  .  Blood pressure looks good.  9.  Vit D deficiency :   will check Vit D today     Kristeen Miss, MD  03/28/2023 3:02 PM    [facility] [address] [place],  [address] [place], [place]  [address] Pager [phone] Phone: [phone]; Fax: [phone]

## 2023-03-28 ENCOUNTER — Ambulatory Visit: Payer: Medicare HMO | Attending: Cardiovascular Disease | Admitting: Cardiovascular Disease

## 2023-03-28 ENCOUNTER — Encounter: Payer: Self-pay | Admitting: Cardiovascular Disease

## 2023-03-28 VITALS — BP 118/70 | HR 89 | Ht 64.0 in | Wt 125.2 lb

## 2023-03-28 DIAGNOSIS — I480 Paroxysmal atrial fibrillation: Secondary | ICD-10-CM | POA: Diagnosis not present

## 2023-03-28 DIAGNOSIS — Z79899 Other long term (current) drug therapy: Secondary | ICD-10-CM | POA: Diagnosis not present

## 2023-03-28 DIAGNOSIS — E782 Mixed hyperlipidemia: Secondary | ICD-10-CM

## 2023-03-28 DIAGNOSIS — I2510 Atherosclerotic heart disease of native coronary artery without angina pectoris: Secondary | ICD-10-CM

## 2023-03-28 MED ORDER — APIXABAN 2.5 MG PO TABS: 2.5000 mg | ORAL_TABLET | Freq: Two times a day (BID) | ORAL | 3 refills | Status: DC

## 2023-03-28 MED ORDER — DILTIAZEM HCL ER COATED BEADS 240 MG PO CP24: 240.0000 mg | ORAL_CAPSULE | Freq: Every day | ORAL | 3 refills | Status: DC

## 2023-03-28 MED ORDER — METOPROLOL TARTRATE 25 MG PO TABS: 12.5000 mg | ORAL_TABLET | Freq: Two times a day (BID) | ORAL | 3 refills | Status: DC

## 2023-03-28 NOTE — Patient Instructions (Addendum)
Medication Instructions:  DECREASE Eliquis to 2.5mg  twice daily DECREASE Diltiazem to 240mg  daily DECREASE Metoprolol to 12.5mg  twice daily *If you need a refill on your cardiac medications before your next appointment, please call your pharmacy*   Lab Work: BMET today If you have labs (blood work) drawn today and your tests are completely normal, you will receive your results only by: MyChart Message (if you have MyChart) OR A paper copy in the mail If you have any lab test that is abnormal or we need to change your treatment, we will call you to review the results.   Testing/Procedures: NONE   Follow-Up: At [facility], you and your health needs are our priority.  As part of our continuing mission to provide you with exceptional heart care, we have created designated Provider Care Teams.  These Care Teams include your primary Cardiologist (physician) and Advanced Practice Providers (APPs -  Physician Assistants and Nurse Practitioners) who all work together to provide you with the care you need, when you need it.  Your next appointment:   1 year(s)  Provider:   Kristeen Miss, MD

## 2023-04-20 DIAGNOSIS — E222 Syndrome of inappropriate secretion of antidiuretic hormone: Secondary | ICD-10-CM | POA: Diagnosis not present

## 2023-04-20 DIAGNOSIS — D6869 Other thrombophilia: Secondary | ICD-10-CM | POA: Diagnosis not present

## 2023-04-20 DIAGNOSIS — Z23 Encounter for immunization: Secondary | ICD-10-CM | POA: Diagnosis not present

## 2023-04-20 DIAGNOSIS — I480 Paroxysmal atrial fibrillation: Secondary | ICD-10-CM | POA: Diagnosis not present

## 2023-04-20 DIAGNOSIS — I700 Atherosclerosis of aorta: Secondary | ICD-10-CM | POA: Diagnosis not present

## 2023-04-20 DIAGNOSIS — E559 Vitamin D deficiency, unspecified: Secondary | ICD-10-CM | POA: Diagnosis not present

## 2023-04-20 DIAGNOSIS — Z0000 Encounter for general adult medical examination without abnormal findings: Secondary | ICD-10-CM | POA: Diagnosis not present

## 2023-04-20 DIAGNOSIS — I10 Essential (primary) hypertension: Secondary | ICD-10-CM | POA: Diagnosis not present

## 2023-04-20 DIAGNOSIS — E7800 Pure hypercholesterolemia, unspecified: Secondary | ICD-10-CM | POA: Diagnosis not present

## 2023-04-20 DIAGNOSIS — I495 Sick sinus syndrome: Secondary | ICD-10-CM | POA: Diagnosis not present

## 2023-04-20 DIAGNOSIS — M810 Age-related osteoporosis without current pathological fracture: Secondary | ICD-10-CM | POA: Diagnosis not present

## 2023-04-20 DIAGNOSIS — N1832 Chronic kidney disease, stage 3b: Secondary | ICD-10-CM | POA: Diagnosis not present

## 2023-04-20 DIAGNOSIS — E2839 Other primary ovarian failure: Secondary | ICD-10-CM | POA: Diagnosis not present

## 2023-06-26 ENCOUNTER — Other Ambulatory Visit: Payer: Self-pay | Admitting: Cardiovascular Disease

## 2023-07-18 DIAGNOSIS — E559 Vitamin D deficiency, unspecified: Secondary | ICD-10-CM | POA: Diagnosis not present

## 2023-07-18 DIAGNOSIS — M810 Age-related osteoporosis without current pathological fracture: Secondary | ICD-10-CM | POA: Diagnosis not present

## 2023-11-27 ENCOUNTER — Other Ambulatory Visit: Payer: Self-pay | Admitting: Cardiovascular Disease

## 2023-12-14 ENCOUNTER — Other Ambulatory Visit: Payer: Self-pay | Admitting: Cardiovascular Disease

## 2024-04-26 DIAGNOSIS — B351 Tinea unguium: Secondary | ICD-10-CM | POA: Diagnosis not present

## 2024-04-26 DIAGNOSIS — M19071 Primary osteoarthritis, right ankle and foot: Secondary | ICD-10-CM | POA: Diagnosis not present

## 2024-04-26 DIAGNOSIS — M79675 Pain in left toe(s): Secondary | ICD-10-CM | POA: Diagnosis not present

## 2024-05-06 ENCOUNTER — Other Ambulatory Visit: Payer: Self-pay

## 2024-05-06 ENCOUNTER — Other Ambulatory Visit: Payer: Self-pay | Admitting: Internal Medicine

## 2024-05-06 MED ORDER — METOPROLOL TARTRATE 25 MG PO TABS: 12.5000 mg | ORAL_TABLET | Freq: Two times a day (BID) | ORAL | 0 refills | Status: AC

## 2024-05-06 MED ORDER — APIXABAN 2.5 MG PO TABS: 2.5000 mg | ORAL_TABLET | Freq: Two times a day (BID) | ORAL | 1 refills | Status: DC

## 2024-05-06 NOTE — Telephone Encounter (Signed)
 Prescription refill request for Eliquis  received. Indication: Last office visit: 03/28/23  P Nahser MD Scr: 1.29 on 03/28/23   Epic Age: 89 Weight: 56.8kg  Based on above findings Eliquis  2.5mg  twice daily is the appropriate dose.  Refill approved. Has upcoming MD appt scheduled.

## 2024-05-06 NOTE — Telephone Encounter (Signed)
*  STAT* If patient is at the pharmacy, call can be transferred to refill team.   1. Which medications need to be refilled? (please list name of each medication and dose if known)  metoprolol  tartrate (LOPRESSOR ) 25 MG tablet  apixaban  (ELIQUIS ) 2.5 MG TABS tablet    2. Would you like to learn more about the convenience, safety, & potential cost savings by using the [facility]? NO   3. Are you open to using the [facility] NO   4. Which pharmacy/location (including street and city if local pharmacy) is medication to be sent to?  [facility] - [place], [place] - [address]     5. Do they need a 30 day or 90 day supply? 90

## 2024-05-06 NOTE — Telephone Encounter (Signed)
 Pt's medication was sent to pt's pharmacy as requested. Confirmation received.

## 2024-05-07 MED ORDER — APIXABAN 2.5 MG PO TABS: 2.5000 mg | ORAL_TABLET | Freq: Two times a day (BID) | ORAL | 0 refills | Status: DC

## 2024-05-07 NOTE — Telephone Encounter (Signed)
 Pt last saw 03/28/23, pt is overdue for follow-up.  Pt has upcoming appt on 05/13/24 with Dr Kriste.  Last labs 03/28/23 Creat 1.29, pt is overdue for labwork, will place a note on upcoming appt pt Needs CBC and BMP for Eliquis  follow-up.  Age 89, weight 56.8kg, based on specified criteria pt is on appropriate dosage of Eliquis  2.5mg  BID for afib.  Will refill rx.

## 2024-05-13 ENCOUNTER — Ambulatory Visit: Admitting: Internal Medicine

## 2024-06-03 DIAGNOSIS — E7800 Pure hypercholesterolemia, unspecified: Secondary | ICD-10-CM | POA: Diagnosis not present

## 2024-06-03 DIAGNOSIS — I10 Essential (primary) hypertension: Secondary | ICD-10-CM | POA: Diagnosis not present

## 2024-06-03 LAB — LAB REPORT - SCANNED: EGFR: 38

## 2024-06-04 ENCOUNTER — Encounter: Payer: Self-pay | Admitting: Internal Medicine

## 2024-06-04 ENCOUNTER — Ambulatory Visit: Attending: Internal Medicine | Admitting: Internal Medicine

## 2024-06-04 VITALS — BP 130/58 | HR 65 | Ht 64.0 in | Wt 130.0 lb

## 2024-06-04 DIAGNOSIS — I480 Paroxysmal atrial fibrillation: Secondary | ICD-10-CM

## 2024-06-04 DIAGNOSIS — I2510 Atherosclerotic heart disease of native coronary artery without angina pectoris: Secondary | ICD-10-CM

## 2024-06-04 DIAGNOSIS — I495 Sick sinus syndrome: Secondary | ICD-10-CM

## 2024-06-04 DIAGNOSIS — Z950 Presence of cardiac pacemaker: Secondary | ICD-10-CM

## 2024-06-04 DIAGNOSIS — E782 Mixed hyperlipidemia: Secondary | ICD-10-CM

## 2024-06-04 NOTE — Progress Notes (Signed)
 Cardiology Office Note   Date:  06/04/2024  ID:  Therma, Lasure January 27, 1926, MRN [id number] PCP: [name] Delano BRAVO, PA  [facility] Providers Cardiologist:  Emeline BRAVO Calender, MD     History of Present Illness Donna Ortega is a 89 y.o. female who presents with her son with past medical history of CAD s/p CABG in 2007, PACs, atrial fibrillation, tachybradycardia syndrome s/p Medtronic pacemaker in 2013, amiodarone  drug rash, GI bleed, hyponatremia who presents today for annual follow-up.  Overall, she is doing very well.  Son states that she stopped taking her rosuvastatin  due to patient preference about a year ago and had a lipid panel yesterday which was ordered by her PCP.  A lipid panel showed an LDL of 180 and they are wondering if she should restart the medicine.  Otherwise no complaints.    ROS:  Review of Systems  All other systems reviewed and are negative.   Physical Exam  Physical Exam Vitals and nursing note reviewed.  Constitutional:      Appearance: Normal appearance.  HENT:     Head: Normocephalic and atraumatic.  Eyes:     Conjunctiva/sclera: Conjunctivae normal.  Neck:     Vascular: No carotid bruit.  Cardiovascular:     Rate and Rhythm: Normal rate and regular rhythm.     Heart sounds: Murmur heard.  Pulmonary:     Effort: Pulmonary effort is normal.     Breath sounds: Normal breath sounds.  Musculoskeletal:        General: No swelling or tenderness.  Skin:    Coloration: Skin is not jaundiced or pale.  Neurological:     Mental Status: She is alert.     VS:  BP (!) 130/58 (BP Location: Left Arm, Patient Position: Sitting, Cuff Size: Normal)   Pulse 65   Ht 5' 4 (1.626 m)   Wt 130 lb (59 kg)   SpO2 98%   BMI 22.31 kg/m         Wt Readings from Last 3 Encounters:  06/04/24 130 lb (59 kg)  03/28/23 125 lb 3.2 oz (56.8 kg)  08/08/22 134 lb (60.8 kg)     EKG Interpretation Date/Time:  Tuesday June 04 2024 11:23:55  EDT Ventricular Rate:  65 PR Interval:    QRS Duration:  176 QT Interval:  504 QTC Calculation: 524 R Axis:   -69  Text Interpretation: Ventricular-paced rhythm When compared with ECG of 08-Aug-2022 13:31, Ventricular rhythm has replaced Junctional tachycardia Reconfirmed by Calender Emeline [phone]) on 06/04/2024 11:32:36 AM    Studies Reviewed   Echocardiogram 08/15/2022:   1. Left ventricular ejection fraction, by estimation, is 50 to 55%. The  left ventricle has low normal function. The left ventricle has no regional  wall motion abnormalities. There is mild left ventricular hypertrophy.  Left ventricular diastolic  parameters are indeterminate.   2. Right ventricular systolic function is mildly reduced. The right  ventricular size is normal. There is mildly elevated pulmonary artery  systolic pressure. The estimated right ventricular systolic pressure is  37.6 mmHg.   3. Left atrial size was moderately dilated.   4. Right atrial size was mildly dilated.   5. The mitral valve is normal in structure. Trivial mitral valve  regurgitation. No evidence of mitral stenosis.   6. Tricuspid valve regurgitation is moderate.   7. The aortic valve is tricuspid. Aortic valve regurgitation is mild. No  aortic stenosis is present.   8. The inferior vena  cava is dilated in size with >50% respiratory  variability, suggesting right atrial pressure of 8 mmHg.      Risk Assessment/Calculations   CHA2DS2-VASc Score =     This indicates a  % annual risk of stroke. The patient's score is based upon:               ASCVD risk score: The ASCVD Risk score (Arnett DK, et al., 2019) failed to calculate for the following reasons:   The 2019 ASCVD risk score is only valid for ages 49 to 31   ASSESSMENT  Tachybradycardia syndrome s/p pacemaker in 2013 device has not been checked since 2022.  Son reports the machine at home broke Paroxysmal atrial fibrillation stroke prophylaxis with renally  dosed Eliquis  which is appropriate.  Recent lab work from PCP shows a GFR of 38.  She is doing well without bleeding issues CAD s/p CABG x 3 (LIMA to LAD, SVG to diagonal, SVG to ramus) in 2007 on Eliquis .  Has stopped rosuvastatin  for the past year due to patient preference Hyperlipidemia LDL per recent labs was 180.  Patient stopped rosuvastatin  due to her age and to try and reduce pill burden.  Plan  We had a discussion today at the bedside regarding her rosuvastatin .  Given her elevated LDL and history of CABG it is best for her to restart the rosuvastatin  for secondary prevention.  Patient and son are in agreement. Lipid panel in 3 months Will try to plug her back in with the device clinic as she has not been evaluated for several years Continue current management  Follow up: 1 year          Signed, Emeline FORBES Calender, MD

## 2024-06-04 NOTE — Patient Instructions (Addendum)
 Medication Instructions:  RESTART Rosuvastatin  (Crestor ) 10 mg once daily   *If you need a refill on your cardiac medications before your next appointment, please call your pharmacy*  Lab Work: To be completed in 3 months: lipid panel (approximately 09/04/24)  If you have labs (blood work) drawn today and your tests are completely normal, you will receive your results only by: MyChart Message (if you have MyChart) OR A paper copy in the mail If you have any lab test that is abnormal or we need to change your treatment, we will call you to review the results.  Testing/Procedures: None ordered today.  Follow-Up: At [facility], you and your health needs are our priority.  As part of our continuing mission to provide you with exceptional heart care, our providers are all part of one team.  This team includes your primary Cardiologist (physician) and Advanced Practice Providers or APPs (Physician Assistants and Nurse Practitioners) who all work together to provide you with the care you need, when you need it.  Your next appointment:   1 year(s)  Provider:   Emeline FORBES Calender, MD

## 2024-07-02 ENCOUNTER — Other Ambulatory Visit: Payer: Self-pay | Admitting: Internal Medicine

## 2024-07-08 ENCOUNTER — Ambulatory Visit: Attending: Cardiovascular Disease | Admitting: Cardiovascular Disease

## 2024-07-08 ENCOUNTER — Encounter: Payer: Self-pay | Admitting: Cardiovascular Disease

## 2024-07-08 VITALS — BP 120/64 | HR 72 | Ht 64.0 in | Wt 133.8 lb

## 2024-07-08 DIAGNOSIS — Z950 Presence of cardiac pacemaker: Secondary | ICD-10-CM

## 2024-07-08 DIAGNOSIS — I495 Sick sinus syndrome: Secondary | ICD-10-CM | POA: Diagnosis not present

## 2024-07-08 DIAGNOSIS — I480 Paroxysmal atrial fibrillation: Secondary | ICD-10-CM | POA: Diagnosis not present

## 2024-07-08 DIAGNOSIS — Z01812 Encounter for preprocedural laboratory examination: Secondary | ICD-10-CM

## 2024-07-08 LAB — CBC
Hematocrit: 36.3 % (ref 34.0–46.6)
Hemoglobin: 12 g/dL (ref 11.1–15.9)
MCH: 31.8 pg (ref 26.6–33.0)
MCHC: 33.1 g/dL (ref 31.5–35.7)
MCV: 96 fL (ref 79–97)
Platelets: 204 x10E3/uL (ref 150–450)
RBC: 3.77 x10E6/uL (ref 3.77–5.28)
RDW: 12 % (ref 11.7–15.4)
WBC: 5.9 x10E3/uL (ref 3.4–10.8)

## 2024-07-08 LAB — BASIC METABOLIC PANEL WITH GFR
BUN/Creatinine Ratio: 17 (ref 12–28)
BUN: 20 mg/dL (ref 10–36)
CO2: 24 mmol/L (ref 20–29)
Calcium: 9.2 mg/dL (ref 8.7–10.3)
Chloride: 104 mmol/L (ref 96–106)
Creatinine, Ser: 1.2 mg/dL — ABNORMAL HIGH (ref 0.57–1.00)
Glucose: 105 mg/dL — ABNORMAL HIGH (ref 70–99)
Potassium: 4.4 mmol/L (ref 3.5–5.2)
Sodium: 141 mmol/L (ref 134–144)
eGFR: 41 mL/min/1.73 — ABNORMAL LOW (ref >59)

## 2024-07-08 NOTE — Progress Notes (Signed)
  Electrophysiology Office Note:    Date:  07/08/2024   ID:  [name], [name] [birth year], MRN [id number]  PCP:  [name], [name]  E, PA   [facility] Providers Cardiologist:  Emeline FORBES Calender, MD     Referring MD: [name], [name]  E, PA   History of Present Illness:    BIONCA MCKEY is a 89 y.o. female with a medical history significant for bradycardia with a Medtronic pacemaker, coronary disease s/p CABG 2007 referred for device management.       History of Present Illness She has a Medtronic dual-chamber pacemaker implanted in June 2013.  She also has a history of tachybradycardia but appears to be primarily in A-fib now.  She was on amiodarone  in the past but developed a rash.         Today, she reports that she is doing well and has no acute complaints.  EKGs/Labs/Other Studies Reviewed Today:     Echocardiogram:  January 2024 TTE EF 50 to 55%.  Mild LVH.  Moderately dilated left atrium.     EKG:   EKG Interpretation Date/Time:  Monday July 08 2024 08:26:38 EST Ventricular Rate:  73 PR Interval:    QRS Duration:  78 QT Interval:  406 QTC Calculation: 447 R Axis:   71  Text Interpretation: ST & T wave abnormality, consider inferior ischemia ST & T wave abnormality, consider anterolateral ischemia When compared with ECG of 04-Jun-2024 11:23, Atrial fibrillation with occasional ventricular pacing Confirmed by [name] [phone]) on 07/08/2024 8:33:38 AM     Physical Exam:    VS:  BP 120/64 (BP Location: Right Arm, Patient Position: Sitting, Cuff Size: Normal)   Pulse 72   Ht 5' 4 (1.626 m)   Wt 133 lb 12.8 oz (60.7 kg)   SpO2 96%   BMI 22.97 kg/m     Wt Readings from Last 3 Encounters:  07/08/24 133 lb 12.8 oz (60.7 kg)  06/04/24 130 lb (59 kg)  03/28/23 125 lb 3.2 oz (56.8 kg)     GEN: Well nourished, well developed in no acute distress CARDIAC: iRRR, no murmurs, rubs, gallops RESPIRATORY:  Normal work of  breathing MUSCULOSKELETAL:  edema    ASSESSMENT & PLAN:     Permanent atrial fibrillation Rates are controlled  Tachy-Bradycardia Medtronic dual-chamber pacemaker in place Device is currently VVI 65, at EOL We discussed the indication for generator change.  I explained risks including but not limited to infection and bleeding.  She would like to proceed.      Signed, Scotts FORBES Nancey, MD  07/08/2024 8:38 AM    [facility]

## 2024-07-08 NOTE — Patient Instructions (Signed)
 Medication Instructions:  Your physician recommends that you continue on your current medications as directed. Please refer to the Current Medication list given to you today.  *If you need a refill on your cardiac medications before your next appointment, please call your pharmacy*  Lab Work: CBC and BMET today  If you have labs (blood work) drawn today and your tests are completely normal, you will receive your results only by: MyChart Message (if you have MyChart) OR A paper copy in the mail If you have any lab test that is abnormal or we need to change your treatment, we will call you to review the results.  Testing/Procedures: None ordered.   Follow-Up: At [facility], you and your health needs are our priority.  As part of our continuing mission to provide you with exceptional heart care, our providers are all part of one team.  This team includes your primary Cardiologist (physician) and Advanced Practice Providers or APPs (Physician Assistants and Nurse Practitioners) who all work together to provide you with the care you need, when you need it.  Your next appointment:   To be scheduled

## 2024-07-08 NOTE — H&P (View-Only) (Signed)
  Electrophysiology Office Note:    Date:  07/08/2024   ID:  Donna, Ortega [birth year], MRN [id number]  PCP:  Donna, [name]  E, PA   [facility] Providers Cardiologist:  Donna FORBES Calender, MD     Referring MD: Donna, [name]  E, PA   History of Present Illness:    Donna Ortega is a 89 y.o. female with a medical history significant for bradycardia with a Medtronic pacemaker, coronary disease s/p CABG 2007 referred for device management.       History of Present Illness She has a Medtronic dual-chamber pacemaker implanted in June 2013.  She also has a history of tachybradycardia but appears to be primarily in A-fib now.  She was on amiodarone  in the past but developed a rash.         Today, she reports that she is doing well and has no acute complaints.  EKGs/Labs/Other Studies Reviewed Today:     Echocardiogram:  January 2024 TTE EF 50 to 55%.  Mild LVH.  Moderately dilated left atrium.     EKG:   EKG Interpretation Date/Time:  Monday July 08 2024 08:26:38 EST Ventricular Rate:  73 PR Interval:    QRS Duration:  78 QT Interval:  406 QTC Calculation: 447 R Axis:   71  Text Interpretation: ST & T wave abnormality, consider inferior ischemia ST & T wave abnormality, consider anterolateral ischemia When compared with ECG of 04-Jun-2024 11:23, Atrial fibrillation with occasional ventricular pacing Confirmed by [name] [phone]) on 07/08/2024 8:33:38 AM     Physical Exam:    VS:  BP 120/64 (BP Location: Right Arm, Patient Position: Sitting, Cuff Size: Normal)   Pulse 72   Ht 5' 4 (1.626 m)   Wt 133 lb 12.8 oz (60.7 kg)   SpO2 96%   BMI 22.97 kg/m     Wt Readings from Last 3 Encounters:  07/08/24 133 lb 12.8 oz (60.7 kg)  06/04/24 130 lb (59 kg)  03/28/23 125 lb 3.2 oz (56.8 kg)     GEN: Well nourished, well developed in no acute distress CARDIAC: iRRR, no murmurs, rubs, gallops RESPIRATORY:  Normal work of  breathing MUSCULOSKELETAL:  edema    ASSESSMENT & PLAN:     Permanent atrial fibrillation Rates are controlled  Tachy-Bradycardia Medtronic dual-chamber pacemaker in place Device is currently VVI 65, at EOL We discussed the indication for generator change.  I explained risks including but not limited to infection and bleeding.  She would like to proceed.      Signed, Scotts FORBES Nancey, MD  07/08/2024 8:38 AM    [facility]

## 2024-07-09 ENCOUNTER — Telehealth: Payer: Self-pay

## 2024-07-09 ENCOUNTER — Ambulatory Visit: Payer: Self-pay | Admitting: Cardiovascular Disease

## 2024-07-09 NOTE — Telephone Encounter (Signed)
-----   Message from Nurse Aldona R sent at 07/08/2024  9:00 AM EST ----- Regarding: PPM gen change 07/16/2024 Important: list procedure date as first item in subject line, followed by procedure type (e.g., 04/26/24 PPM implant)  Precert:  MD: Mealor Type of implant: PPM Device manufacturer: Medtronic Diagnosis: ERI CPT code: PPM implant, dual - 66791 C-code(s), including quantity (if indicated): [id number] Procedure scheduled (date/time): 12/2 1130  Procedure:  Scrub given? Yes  Medication instructions: hold your Eliquis  (Apixaban ) for 2 day(s) prior to your procedure. Your last dose will be Saturday, November 29, PM dose. Message sent to CV[facility]? N/A Added to calendar? Yes Orders entered? Yes Letter complete? Yes Scheduled with cath lab? Yes Labs ordered (CBC, BMET, PT/INR if on warfarin)? Yes Dye allergy? No Pre-meds ordered and instructions given? N/A Letter method: given to pt 11/24 Special instructions: N/A H&P: 11/24  Follow-up:  Cassie/Angel, please schedule Routine.  Covering MW:[id number]  [id number].  Please send this message to [employer] pool, EP scheduler, EP Scheduling pool, and EP [employer].

## 2024-07-15 NOTE — Pre-Procedure Instructions (Signed)
 Attempted to call patient regarding procedure instructions.  Left voicemail on the following items: Arrival time 0930 Nothing to eat or drink after midnight No meds AM of procedure Responsible person to drive you home and stay with you for 24 hrs Wash with special soap night before and morning of procedure If on anti-coagulant drug instructions Eliquis - last dose should have been Saturday 11/29.

## 2024-07-16 ENCOUNTER — Ambulatory Visit (HOSPITAL_COMMUNITY)
Admission: RE | Admit: 2024-07-16 | Discharge: 2024-07-16 | Disposition: A | Discharge status: Home / Self Care | Attending: Cardiovascular Disease | Admitting: Cardiovascular Disease

## 2024-07-16 ENCOUNTER — Other Ambulatory Visit: Payer: Self-pay

## 2024-07-16 ENCOUNTER — Ambulatory Visit (HOSPITAL_COMMUNITY)
Admission: RE | Disposition: A | Discharge status: Home / Self Care | Payer: Self-pay | Source: Home / Self Care | Attending: Cardiovascular Disease

## 2024-07-16 DIAGNOSIS — I4821 Permanent atrial fibrillation: Secondary | ICD-10-CM | POA: Insufficient documentation

## 2024-07-16 DIAGNOSIS — I495 Sick sinus syndrome: Secondary | ICD-10-CM | POA: Insufficient documentation

## 2024-07-16 DIAGNOSIS — Z951 Presence of aortocoronary bypass graft: Secondary | ICD-10-CM | POA: Insufficient documentation

## 2024-07-16 DIAGNOSIS — Z950 Presence of cardiac pacemaker: Secondary | ICD-10-CM

## 2024-07-16 DIAGNOSIS — Z45010 Encounter for checking and testing of cardiac pacemaker pulse generator [battery]: Secondary | ICD-10-CM | POA: Insufficient documentation

## 2024-07-16 DIAGNOSIS — I2510 Atherosclerotic heart disease of native coronary artery without angina pectoris: Secondary | ICD-10-CM | POA: Insufficient documentation

## 2024-07-16 HISTORY — PX: PPM GENERATOR CHANGEOUT: EP1233

## 2024-07-16 SURGERY — PPM GENERATOR CHANGEOUT

## 2024-07-16 MED ORDER — MIDAZOLAM HCL 5 MG/5ML IJ SOLN
INTRAMUSCULAR | Status: DC | PRN
  Administered 2024-07-16 (×2): 1 mg via INTRAVENOUS

## 2024-07-16 MED ORDER — LIDOCAINE HCL (PF) 1 % IJ SOLN
INTRAMUSCULAR | Status: AC
  Filled 2024-07-16: qty 30

## 2024-07-16 MED ORDER — FENTANYL CITRATE (PF) 100 MCG/2ML IJ SOLN
INTRAMUSCULAR | Status: AC
  Filled 2024-07-16: qty 2

## 2024-07-16 MED ORDER — POVIDONE-IODINE 10 % EX SWAB
2.0000 | Freq: Once | CUTANEOUS | Status: AC
  Administered 2024-07-16: 2 via TOPICAL

## 2024-07-16 MED ORDER — LIDOCAINE HCL (PF) 1 % IJ SOLN
INTRAMUSCULAR | Status: DC | PRN
  Administered 2024-07-16: 60 mL

## 2024-07-16 MED ORDER — ONDANSETRON HCL 4 MG/2ML IJ SOLN: 4.0000 mg | Freq: Four times a day (QID) | INTRAMUSCULAR | Status: DC | PRN

## 2024-07-16 MED ORDER — SODIUM CHLORIDE 0.9 % IV SOLN
INTRAVENOUS | Status: AC
  Administered 2024-07-16: 80 mg
  Filled 2024-07-16: qty 2

## 2024-07-16 MED ORDER — APIXABAN 2.5 MG PO TABS: 2.5000 mg | ORAL_TABLET | Freq: Two times a day (BID) | ORAL | Status: AC

## 2024-07-16 MED ORDER — FENTANYL CITRATE (PF) 100 MCG/2ML IJ SOLN
INTRAMUSCULAR | Status: DC | PRN
  Administered 2024-07-16 (×2): 25 ug via INTRAVENOUS

## 2024-07-16 MED ORDER — MIDAZOLAM HCL 2 MG/2ML IJ SOLN
INTRAMUSCULAR | Status: AC
  Filled 2024-07-16: qty 2

## 2024-07-16 MED ORDER — ACETAMINOPHEN 325 MG PO TABS: 325.0000 mg | ORAL_TABLET | ORAL | Status: DC | PRN

## 2024-07-16 MED ORDER — CEFAZOLIN SODIUM-DEXTROSE 2-4 GM/100ML-% IV SOLN
INTRAVENOUS | Status: DC
  Filled 2024-07-16: qty 100

## 2024-07-16 MED ORDER — SODIUM CHLORIDE 0.9 % IV SOLN: 80.0000 mg | INTRAVENOUS | Status: AC

## 2024-07-16 MED ORDER — SODIUM CHLORIDE 0.9 % IV SOLN: INTRAVENOUS | Status: DC

## 2024-07-16 MED ORDER — LIDOCAINE IN D5W 4-5 MG/ML-% IV SOLN
INTRAVENOUS | Status: AC
  Filled 2024-07-16: qty 500

## 2024-07-16 MED ORDER — CEFAZOLIN SODIUM-DEXTROSE 2-4 GM/100ML-% IV SOLN
2.0000 g | INTRAVENOUS | Status: AC
  Administered 2024-07-16: 2 g via INTRAVENOUS

## 2024-07-16 MED ORDER — CHLORHEXIDINE GLUCONATE 4 % EX SOLN
4.0000 | Freq: Once | CUTANEOUS | Status: DC
  Filled 2024-07-16: qty 60

## 2024-07-16 SURGICAL SUPPLY — 5 items
CABLE SURGICAL S-101-97-12 (CABLE) ×1 IMPLANT
IPG PACE AZUR XT DR MRI W1DR01 (Pacemaker) IMPLANT
PAD DEFIB RADIO PHYSIO CONN (PAD) ×1 IMPLANT
POUCH AIGIS-R ANTIBACT PPM MED (Mesh General) IMPLANT
TRAY PACEMAKER INSERTION (PACKS) ×1 IMPLANT

## 2024-07-16 NOTE — Progress Notes (Addendum)
 Report received from Jill. Seen by MD mealor. IV removed, MD Mealor states when to resume Eliquis  to patient as well as patient son, written on discharge packet. Discharge instructions given via RN Kate. No further questions at this point.

## 2024-07-16 NOTE — Discharge Instructions (Signed)

## 2024-07-16 NOTE — Interval H&P Note (Signed)
 History and Physical Interval Note:  07/16/2024 11:36 AM  Donna Ortega  has presented today for surgery, with the diagnosis of eri.  The various methods of treatment have been discussed with the patient and family. After consideration of risks, benefits and other options for treatment, the patient has consented to  Procedure(s): PPM GENERATOR CHANGEOUT (N/A) as a surgical intervention.  The patient's history has been reviewed, patient examined, no change in status, stable for surgery.  I have reviewed the patient's chart and labs.  Questions were answered to the patient's satisfaction.     Tasheem Elms E Gunner Iodice

## 2024-07-17 ENCOUNTER — Encounter (HOSPITAL_COMMUNITY): Payer: Self-pay | Admitting: Cardiovascular Disease

## 2024-07-17 MED FILL — Midazolam HCl Inj 2 MG/2ML (Base Equivalent): INTRAMUSCULAR | Qty: 2 | Status: CN

## 2024-07-17 MED FILL — Midazolam HCl Inj 2 MG/2ML (Base Equivalent): INTRAMUSCULAR | Qty: 2 | Status: AC

## 2024-07-26 DIAGNOSIS — B351 Tinea unguium: Secondary | ICD-10-CM | POA: Diagnosis not present

## 2024-07-26 DIAGNOSIS — M79675 Pain in left toe(s): Secondary | ICD-10-CM | POA: Diagnosis not present

## 2024-08-27 ENCOUNTER — Ambulatory Visit

## 2024-08-27 DIAGNOSIS — I495 Sick sinus syndrome: Secondary | ICD-10-CM

## 2024-08-28 LAB — CUP PACEART REMOTE DEVICE CHECK
Battery Remaining Longevity: 123 mo
Battery Voltage: 3.19 V
Brady Statistic AP VP Percent: 87.75 %
Brady Statistic AP VS Percent: 0.17 %
Brady Statistic AS VP Percent: 11.34 %
Brady Statistic AS VS Percent: 0.77 %
Brady Statistic RA Percent Paced: 74.5 %
Brady Statistic RV Percent Paced: 91.95 %
Date Time Interrogation Session: 20260113015149
Implantable Lead Connection Status: 753985
Implantable Lead Connection Status: 753985
Implantable Lead Implant Date: 20130626
Implantable Lead Implant Date: 20130626
Implantable Lead Location: 753859
Implantable Lead Location: 753860
Implantable Pulse Generator Implant Date: 20251202
Lead Channel Impedance Value: 266 Ohm
Lead Channel Impedance Value: 323 Ohm
Lead Channel Impedance Value: 342 Ohm
Lead Channel Impedance Value: 361 Ohm
Lead Channel Pacing Threshold Amplitude: 0.625 V
Lead Channel Pacing Threshold Amplitude: 0.875 V
Lead Channel Pacing Threshold Pulse Width: 0.4 ms
Lead Channel Pacing Threshold Pulse Width: 0.4 ms
Lead Channel Sensing Intrinsic Amplitude: 0.25 mV
Lead Channel Sensing Intrinsic Amplitude: 0.25 mV
Lead Channel Sensing Intrinsic Amplitude: 7 mV
Lead Channel Sensing Intrinsic Amplitude: 7 mV
Lead Channel Setting Pacing Amplitude: 2 V
Lead Channel Setting Pacing Amplitude: 2.5 V
Lead Channel Setting Pacing Pulse Width: 0.4 ms
Lead Channel Setting Sensing Sensitivity: 1.2 mV
Zone Setting Status: 755011

## 2024-08-30 ENCOUNTER — Ambulatory Visit: Payer: Self-pay | Admitting: Cardiovascular Disease

## 2024-08-30 NOTE — Progress Notes (Signed)
 Remote PPM Transmission

## 2024-09-24 ENCOUNTER — Ambulatory Visit

## 2024-10-17 ENCOUNTER — Ambulatory Visit: Admitting: Cardiology

## 2024-11-26 ENCOUNTER — Ambulatory Visit

## 2025-02-25 ENCOUNTER — Ambulatory Visit

## 2025-05-27 ENCOUNTER — Ambulatory Visit
